# Patient Record
Sex: Male | Born: 1935 | Race: White | Hispanic: No | Marital: Married | Smoking: Former smoker
Health system: Southern US, Community
[De-identification: ages and names within clinical notes are randomized; demographics above are authoritative.]

## PROBLEM LIST (undated history)

## (undated) DIAGNOSIS — D72829 Elevated white blood cell count, unspecified: Principal | ICD-10-CM

## (undated) DIAGNOSIS — N182 Chronic kidney disease, stage 2 (mild): Secondary | ICD-10-CM

## (undated) DIAGNOSIS — J449 Chronic obstructive pulmonary disease, unspecified: Secondary | ICD-10-CM

## (undated) DIAGNOSIS — I480 Paroxysmal atrial fibrillation: Secondary | ICD-10-CM

## (undated) DIAGNOSIS — C3411 Malignant neoplasm of upper lobe, right bronchus or lung: Secondary | ICD-10-CM

## (undated) DIAGNOSIS — K219 Gastro-esophageal reflux disease without esophagitis: Secondary | ICD-10-CM

## (undated) DIAGNOSIS — C801 Malignant (primary) neoplasm, unspecified: Secondary | ICD-10-CM

## (undated) DIAGNOSIS — E785 Hyperlipidemia, unspecified: Secondary | ICD-10-CM

## (undated) DIAGNOSIS — I5022 Chronic systolic (congestive) heart failure: Secondary | ICD-10-CM

## (undated) DIAGNOSIS — I2510 Atherosclerotic heart disease of native coronary artery without angina pectoris: Secondary | ICD-10-CM

## (undated) DIAGNOSIS — E079 Disorder of thyroid, unspecified: Secondary | ICD-10-CM

## (undated) DIAGNOSIS — I10 Essential (primary) hypertension: Secondary | ICD-10-CM

## (undated) DIAGNOSIS — I2699 Other pulmonary embolism without acute cor pulmonale: Secondary | ICD-10-CM

## (undated) HISTORY — DX: Essential (primary) hypertension: I10

## (undated) HISTORY — DX: Paroxysmal atrial fibrillation: I48.0

## (undated) HISTORY — DX: Gastro-esophageal reflux disease without esophagitis: K21.9

## (undated) HISTORY — DX: Atherosclerotic heart disease of native coronary artery without angina pectoris: I25.10

## (undated) HISTORY — DX: Elevated white blood cell count, unspecified: D72.829

## (undated) HISTORY — PX: CAROTID STENT: SHX1301

## (undated) HISTORY — DX: Other pulmonary embolism without acute cor pulmonale: I26.99

## (undated) HISTORY — DX: Malignant neoplasm of upper lobe, right bronchus or lung: C34.11

## (undated) HISTORY — DX: Hyperlipidemia, unspecified: E78.5

## (undated) HISTORY — DX: Malignant (primary) neoplasm, unspecified: C80.1

## (undated) HISTORY — PX: HERNIA REPAIR: SHX51

## (undated) HISTORY — PX: CHOLECYSTECTOMY: SHX55

## (undated) HISTORY — DX: Chronic obstructive pulmonary disease, unspecified: J44.9

## (undated) HISTORY — DX: Disorder of thyroid, unspecified: E07.9

---

## 1997-08-04 ENCOUNTER — Inpatient Hospital Stay (HOSPITAL_COMMUNITY): Admission: EM | Admit: 1997-08-04 | Discharge: 1997-08-06 | Payer: Self-pay | Admitting: Emergency Medicine

## 1998-11-09 ENCOUNTER — Ambulatory Visit (HOSPITAL_BASED_OUTPATIENT_CLINIC_OR_DEPARTMENT_OTHER): Admission: RE | Admit: 1998-11-09 | Discharge: 1998-11-09 | Payer: Self-pay | Admitting: Orthopaedic Surgery

## 1999-08-12 ENCOUNTER — Ambulatory Visit (HOSPITAL_COMMUNITY): Admission: RE | Admit: 1999-08-12 | Discharge: 1999-08-12 | Payer: Self-pay | Admitting: *Deleted

## 1999-08-12 ENCOUNTER — Encounter (INDEPENDENT_AMBULATORY_CARE_PROVIDER_SITE_OTHER): Payer: Self-pay | Admitting: Specialist

## 2001-07-19 ENCOUNTER — Ambulatory Visit (HOSPITAL_COMMUNITY): Admission: RE | Admit: 2001-07-19 | Discharge: 2001-07-19 | Payer: Self-pay | Admitting: Cardiology

## 2001-08-06 ENCOUNTER — Ambulatory Visit (HOSPITAL_COMMUNITY): Admission: RE | Admit: 2001-08-06 | Discharge: 2001-08-07 | Payer: Self-pay | Admitting: Cardiology

## 2002-03-07 ENCOUNTER — Ambulatory Visit (HOSPITAL_COMMUNITY): Admission: RE | Admit: 2002-03-07 | Discharge: 2002-03-07 | Payer: Self-pay | Admitting: Cardiology

## 2002-03-07 ENCOUNTER — Encounter: Payer: Self-pay | Admitting: Cardiology

## 2002-04-17 ENCOUNTER — Ambulatory Visit: Admission: RE | Admit: 2002-04-17 | Discharge: 2002-04-17 | Payer: Self-pay | Admitting: Pulmonary Disease

## 2002-05-31 ENCOUNTER — Encounter: Payer: Self-pay | Admitting: Gastroenterology

## 2002-05-31 ENCOUNTER — Inpatient Hospital Stay (HOSPITAL_COMMUNITY): Admission: EM | Admit: 2002-05-31 | Discharge: 2002-06-03 | Payer: Self-pay

## 2002-06-01 ENCOUNTER — Encounter: Payer: Self-pay | Admitting: Gastroenterology

## 2002-06-02 ENCOUNTER — Encounter (INDEPENDENT_AMBULATORY_CARE_PROVIDER_SITE_OTHER): Payer: Self-pay | Admitting: Specialist

## 2002-08-07 ENCOUNTER — Ambulatory Visit (HOSPITAL_COMMUNITY): Admission: RE | Admit: 2002-08-07 | Discharge: 2002-08-07 | Payer: Self-pay | Admitting: Cardiology

## 2005-07-27 ENCOUNTER — Encounter: Admission: RE | Admit: 2005-07-27 | Discharge: 2005-07-27 | Payer: Self-pay | Admitting: Internal Medicine

## 2007-03-21 DIAGNOSIS — C801 Malignant (primary) neoplasm, unspecified: Secondary | ICD-10-CM

## 2007-03-21 HISTORY — DX: Malignant (primary) neoplasm, unspecified: C80.1

## 2007-04-03 ENCOUNTER — Encounter: Admission: RE | Admit: 2007-04-03 | Discharge: 2007-04-03 | Payer: Self-pay | Admitting: Cardiology

## 2007-04-12 ENCOUNTER — Ambulatory Visit (HOSPITAL_COMMUNITY): Admission: RE | Admit: 2007-04-12 | Discharge: 2007-04-12 | Payer: Self-pay | Admitting: Internal Medicine

## 2007-05-01 ENCOUNTER — Ambulatory Visit: Payer: Self-pay | Admitting: Thoracic Surgery

## 2007-05-03 ENCOUNTER — Ambulatory Visit: Payer: Self-pay | Admitting: Internal Medicine

## 2007-05-06 ENCOUNTER — Encounter (INDEPENDENT_AMBULATORY_CARE_PROVIDER_SITE_OTHER): Payer: Self-pay | Admitting: Diagnostic Radiology

## 2007-05-06 ENCOUNTER — Ambulatory Visit (HOSPITAL_COMMUNITY): Admission: RE | Admit: 2007-05-06 | Discharge: 2007-05-06 | Payer: Self-pay | Admitting: Thoracic Surgery

## 2007-05-07 ENCOUNTER — Ambulatory Visit: Admission: RE | Admit: 2007-05-07 | Discharge: 2007-07-16 | Payer: Self-pay | Admitting: Radiation Oncology

## 2007-05-07 ENCOUNTER — Ambulatory Visit (HOSPITAL_COMMUNITY): Admission: RE | Admit: 2007-05-07 | Discharge: 2007-05-07 | Payer: Self-pay | Admitting: Thoracic Surgery

## 2007-05-08 LAB — CBC WITH DIFFERENTIAL/PLATELET
BASO%: 0.8 % (ref 0.0–2.0)
EOS%: 5.7 % (ref 0.0–7.0)
HCT: 45 % (ref 38.7–49.9)
MCH: 31.6 pg (ref 28.0–33.4)
MCHC: 35.5 g/dL (ref 32.0–35.9)
MONO#: 0.6 10*3/uL (ref 0.1–0.9)
NEUT%: 59.9 % (ref 40.0–75.0)
RBC: 5.03 10*6/uL (ref 4.20–5.71)
WBC: 9.4 10*3/uL (ref 4.0–10.0)
lymph#: 2.6 10*3/uL (ref 0.9–3.3)

## 2007-05-08 LAB — COMPREHENSIVE METABOLIC PANEL
ALT: 36 U/L (ref 0–53)
AST: 26 U/L (ref 0–37)
Calcium: 9.5 mg/dL (ref 8.4–10.5)
Chloride: 102 mEq/L (ref 96–112)
Creatinine, Ser: 1.55 mg/dL — ABNORMAL HIGH (ref 0.40–1.50)
Sodium: 139 mEq/L (ref 135–145)
Total Bilirubin: 1.1 mg/dL (ref 0.3–1.2)
Total Protein: 6.9 g/dL (ref 6.0–8.3)

## 2007-05-27 LAB — COMPREHENSIVE METABOLIC PANEL
ALT: 22 U/L (ref 0–53)
BUN: 32 mg/dL — ABNORMAL HIGH (ref 6–23)
CO2: 24 mEq/L (ref 19–32)
Calcium: 9.3 mg/dL (ref 8.4–10.5)
Chloride: 104 mEq/L (ref 96–112)
Creatinine, Ser: 1.57 mg/dL — ABNORMAL HIGH (ref 0.40–1.50)

## 2007-05-27 LAB — CBC WITH DIFFERENTIAL/PLATELET
BASO%: 1.3 % (ref 0.0–2.0)
HCT: 42.1 % (ref 38.7–49.9)
HGB: 14.9 g/dL (ref 13.0–17.1)
MCHC: 35.4 g/dL (ref 32.0–35.9)
MONO#: 0.9 10*3/uL (ref 0.1–0.9)
NEUT%: 68.3 % (ref 40.0–75.0)
RDW: 12.3 % (ref 11.2–14.6)
WBC: 9.4 10*3/uL (ref 4.0–10.0)
lymph#: 1.7 10*3/uL (ref 0.9–3.3)

## 2007-05-28 ENCOUNTER — Ambulatory Visit: Payer: Self-pay | Admitting: Thoracic Surgery

## 2007-06-03 LAB — COMPREHENSIVE METABOLIC PANEL
Albumin: 4.5 g/dL (ref 3.5–5.2)
BUN: 35 mg/dL — ABNORMAL HIGH (ref 6–23)
CO2: 23 mEq/L (ref 19–32)
Calcium: 9.7 mg/dL (ref 8.4–10.5)
Chloride: 102 mEq/L (ref 96–112)
Creatinine, Ser: 1.54 mg/dL — ABNORMAL HIGH (ref 0.40–1.50)
Glucose, Bld: 183 mg/dL — ABNORMAL HIGH (ref 70–99)
Potassium: 4.4 mEq/L (ref 3.5–5.3)

## 2007-06-03 LAB — CBC WITH DIFFERENTIAL/PLATELET
Basophils Absolute: 0.1 10*3/uL (ref 0.0–0.1)
Eosinophils Absolute: 0.2 10*3/uL (ref 0.0–0.5)
HCT: 44.9 % (ref 38.7–49.9)
HGB: 16 g/dL (ref 13.0–17.1)
MCH: 31.4 pg (ref 28.0–33.4)
MONO#: 0.6 10*3/uL (ref 0.1–0.9)
NEUT#: 6.5 10*3/uL (ref 1.5–6.5)
NEUT%: 76.1 % — ABNORMAL HIGH (ref 40.0–75.0)
lymph#: 1.2 10*3/uL (ref 0.9–3.3)

## 2007-06-07 ENCOUNTER — Encounter: Payer: Self-pay | Admitting: Thoracic Surgery

## 2007-06-07 ENCOUNTER — Ambulatory Visit (HOSPITAL_COMMUNITY): Admission: RE | Admit: 2007-06-07 | Discharge: 2007-06-07 | Payer: Self-pay | Admitting: Thoracic Surgery

## 2007-06-07 HISTORY — PX: OTHER SURGICAL HISTORY: SHX169

## 2007-06-10 ENCOUNTER — Ambulatory Visit: Payer: Self-pay | Admitting: Thoracic Surgery

## 2007-06-10 LAB — COMPREHENSIVE METABOLIC PANEL
ALT: 30 U/L (ref 0–53)
AST: 20 U/L (ref 0–37)
Albumin: 4.1 g/dL (ref 3.5–5.2)
BUN: 29 mg/dL — ABNORMAL HIGH (ref 6–23)
CO2: 24 mEq/L (ref 19–32)
Calcium: 9.1 mg/dL (ref 8.4–10.5)
Chloride: 104 mEq/L (ref 96–112)
Creatinine, Ser: 1.52 mg/dL — ABNORMAL HIGH (ref 0.40–1.50)
Potassium: 3.7 mEq/L (ref 3.5–5.3)

## 2007-06-10 LAB — CBC WITH DIFFERENTIAL/PLATELET
BASO%: 1.3 % (ref 0.0–2.0)
Basophils Absolute: 0.1 10*3/uL (ref 0.0–0.1)
EOS%: 1.8 % (ref 0.0–7.0)
HCT: 44.7 % (ref 38.7–49.9)
HGB: 15.7 g/dL (ref 13.0–17.1)
MONO#: 0.7 10*3/uL (ref 0.1–0.9)
NEUT%: 73.9 % (ref 40.0–75.0)
RDW: 12.1 % (ref 11.2–14.6)
WBC: 6.1 10*3/uL (ref 4.0–10.0)
lymph#: 0.8 10*3/uL — ABNORMAL LOW (ref 0.9–3.3)

## 2007-06-11 ENCOUNTER — Ambulatory Visit: Payer: Self-pay | Admitting: Thoracic Surgery

## 2007-06-11 ENCOUNTER — Encounter: Admission: RE | Admit: 2007-06-11 | Discharge: 2007-06-11 | Payer: Self-pay | Admitting: Thoracic Surgery

## 2007-06-17 LAB — COMPREHENSIVE METABOLIC PANEL
ALT: 31 U/L (ref 0–53)
AST: 30 U/L (ref 0–37)
Albumin: 4.6 g/dL (ref 3.5–5.2)
CO2: 25 mEq/L (ref 19–32)
Calcium: 9.3 mg/dL (ref 8.4–10.5)
Chloride: 102 mEq/L (ref 96–112)
Potassium: 3.8 mEq/L (ref 3.5–5.3)
Sodium: 141 mEq/L (ref 135–145)
Total Protein: 6.9 g/dL (ref 6.0–8.3)

## 2007-06-17 LAB — CBC WITH DIFFERENTIAL/PLATELET
BASO%: 1.4 % (ref 0.0–2.0)
EOS%: 1.4 % (ref 0.0–7.0)
HCT: 39.4 % (ref 38.7–49.9)
MCH: 32 pg (ref 28.0–33.4)
MCHC: 36.1 g/dL — ABNORMAL HIGH (ref 32.0–35.9)
MONO#: 1.2 10*3/uL — ABNORMAL HIGH (ref 0.1–0.9)
RDW: 13.7 % (ref 11.2–14.6)
WBC: 6.6 10*3/uL (ref 4.0–10.0)
lymph#: 0.6 10*3/uL — ABNORMAL LOW (ref 0.9–3.3)

## 2007-06-20 ENCOUNTER — Ambulatory Visit: Payer: Self-pay | Admitting: Internal Medicine

## 2007-06-24 LAB — CBC WITH DIFFERENTIAL/PLATELET
Eosinophils Absolute: 0.2 10*3/uL (ref 0.0–0.5)
MCV: 89.8 fL (ref 81.6–98.0)
MONO#: 0.6 10*3/uL (ref 0.1–0.9)
MONO%: 6.7 % (ref 0.0–13.0)
NEUT#: 6.9 10*3/uL — ABNORMAL HIGH (ref 1.5–6.5)
RBC: 4.35 10*6/uL (ref 4.20–5.71)
RDW: 13.3 % (ref 11.2–14.6)
WBC: 8.3 10*3/uL (ref 4.0–10.0)
lymph#: 0.6 10*3/uL — ABNORMAL LOW (ref 0.9–3.3)

## 2007-06-24 LAB — COMPREHENSIVE METABOLIC PANEL
AST: 22 U/L (ref 0–37)
Albumin: 4.1 g/dL (ref 3.5–5.2)
Alkaline Phosphatase: 90 U/L (ref 39–117)
Chloride: 105 mEq/L (ref 96–112)
Glucose, Bld: 219 mg/dL — ABNORMAL HIGH (ref 70–99)
Potassium: 4.2 mEq/L (ref 3.5–5.3)
Sodium: 136 mEq/L (ref 135–145)
Total Protein: 6.4 g/dL (ref 6.0–8.3)

## 2007-06-28 ENCOUNTER — Ambulatory Visit: Admission: RE | Admit: 2007-06-28 | Discharge: 2007-06-28 | Payer: Self-pay | Admitting: Radiation Oncology

## 2007-06-28 ENCOUNTER — Ambulatory Visit (HOSPITAL_COMMUNITY): Admission: RE | Admit: 2007-06-28 | Discharge: 2007-06-28 | Payer: Self-pay | Admitting: Radiation Oncology

## 2007-06-28 LAB — COMPREHENSIVE METABOLIC PANEL
AST: 35 U/L (ref 0–37)
Albumin: 3.4 g/dL — ABNORMAL LOW (ref 3.5–5.2)
Alkaline Phosphatase: 66 U/L (ref 39–117)
Potassium: 4.2 mEq/L (ref 3.5–5.3)
Sodium: 135 mEq/L (ref 135–145)
Total Protein: 5.9 g/dL — ABNORMAL LOW (ref 6.0–8.3)

## 2007-06-28 LAB — CBC WITH DIFFERENTIAL/PLATELET
EOS%: 2 % (ref 0.0–7.0)
MCH: 32.1 pg (ref 28.0–33.4)
MCV: 89.6 fL (ref 81.6–98.0)
MONO%: 3.2 % (ref 0.0–13.0)
NEUT#: 3.6 10*3/uL (ref 1.5–6.5)
RBC: 4.02 10*6/uL — ABNORMAL LOW (ref 4.20–5.71)
RDW: 13.8 % (ref 11.2–14.6)

## 2007-07-09 ENCOUNTER — Ambulatory Visit: Payer: Self-pay | Admitting: Thoracic Surgery

## 2007-07-09 ENCOUNTER — Encounter: Admission: RE | Admit: 2007-07-09 | Discharge: 2007-07-09 | Payer: Self-pay | Admitting: Thoracic Surgery

## 2007-07-16 ENCOUNTER — Inpatient Hospital Stay (HOSPITAL_COMMUNITY): Admission: AD | Admit: 2007-07-16 | Discharge: 2007-07-21 | Payer: Self-pay | Admitting: Internal Medicine

## 2007-07-16 ENCOUNTER — Ambulatory Visit: Payer: Self-pay | Admitting: Internal Medicine

## 2007-07-17 ENCOUNTER — Encounter: Payer: Self-pay | Admitting: Internal Medicine

## 2007-07-18 ENCOUNTER — Ambulatory Visit: Payer: Self-pay | Admitting: Thoracic Surgery

## 2007-08-01 ENCOUNTER — Inpatient Hospital Stay (HOSPITAL_COMMUNITY): Admission: RE | Admit: 2007-08-01 | Discharge: 2007-08-10 | Payer: Self-pay | Admitting: Thoracic Surgery

## 2007-08-02 ENCOUNTER — Encounter: Payer: Self-pay | Admitting: Thoracic Surgery

## 2007-08-02 HISTORY — PX: OTHER SURGICAL HISTORY: SHX169

## 2007-08-06 ENCOUNTER — Encounter (INDEPENDENT_AMBULATORY_CARE_PROVIDER_SITE_OTHER): Payer: Self-pay | Admitting: Cardiology

## 2007-08-21 ENCOUNTER — Encounter: Admission: RE | Admit: 2007-08-21 | Discharge: 2007-08-21 | Payer: Self-pay | Admitting: Thoracic Surgery

## 2007-08-21 ENCOUNTER — Ambulatory Visit: Payer: Self-pay | Admitting: Thoracic Surgery

## 2007-09-11 ENCOUNTER — Encounter: Admission: RE | Admit: 2007-09-11 | Discharge: 2007-09-11 | Payer: Self-pay | Admitting: Thoracic Surgery

## 2007-09-11 ENCOUNTER — Ambulatory Visit: Payer: Self-pay | Admitting: Thoracic Surgery

## 2007-10-23 ENCOUNTER — Encounter: Admission: RE | Admit: 2007-10-23 | Discharge: 2007-10-23 | Payer: Self-pay | Admitting: Thoracic Surgery

## 2007-10-23 ENCOUNTER — Ambulatory Visit: Payer: Self-pay | Admitting: Thoracic Surgery

## 2007-12-18 ENCOUNTER — Ambulatory Visit: Payer: Self-pay | Admitting: Thoracic Surgery

## 2007-12-18 ENCOUNTER — Encounter: Admission: RE | Admit: 2007-12-18 | Discharge: 2007-12-18 | Payer: Self-pay | Admitting: Thoracic Surgery

## 2008-01-20 ENCOUNTER — Ambulatory Visit: Payer: Self-pay | Admitting: Thoracic Surgery

## 2008-01-20 ENCOUNTER — Encounter: Admission: RE | Admit: 2008-01-20 | Discharge: 2008-01-20 | Payer: Self-pay | Admitting: Thoracic Surgery

## 2008-04-15 ENCOUNTER — Ambulatory Visit: Payer: Self-pay | Admitting: Pulmonary Disease

## 2008-04-15 DIAGNOSIS — R0602 Shortness of breath: Secondary | ICD-10-CM

## 2008-04-21 ENCOUNTER — Ambulatory Visit: Payer: Self-pay | Admitting: Internal Medicine

## 2008-04-23 ENCOUNTER — Ambulatory Visit: Payer: Self-pay | Admitting: Pulmonary Disease

## 2008-04-23 DIAGNOSIS — J209 Acute bronchitis, unspecified: Secondary | ICD-10-CM | POA: Insufficient documentation

## 2008-04-23 DIAGNOSIS — J440 Chronic obstructive pulmonary disease with acute lower respiratory infection: Secondary | ICD-10-CM

## 2008-07-29 ENCOUNTER — Ambulatory Visit: Payer: Self-pay | Admitting: Thoracic Surgery

## 2008-07-29 ENCOUNTER — Encounter: Admission: RE | Admit: 2008-07-29 | Discharge: 2008-07-29 | Payer: Self-pay | Admitting: Thoracic Surgery

## 2008-09-28 IMAGING — CR DG CHEST 2V
2 series · 2 of 2 positions shown · non-contrast
Comparison: Chest radiograph 08/09/2007 the

CLINICAL DATA: Right upper lobe mass

CHEST - 2 VIEW

[w chest pa]
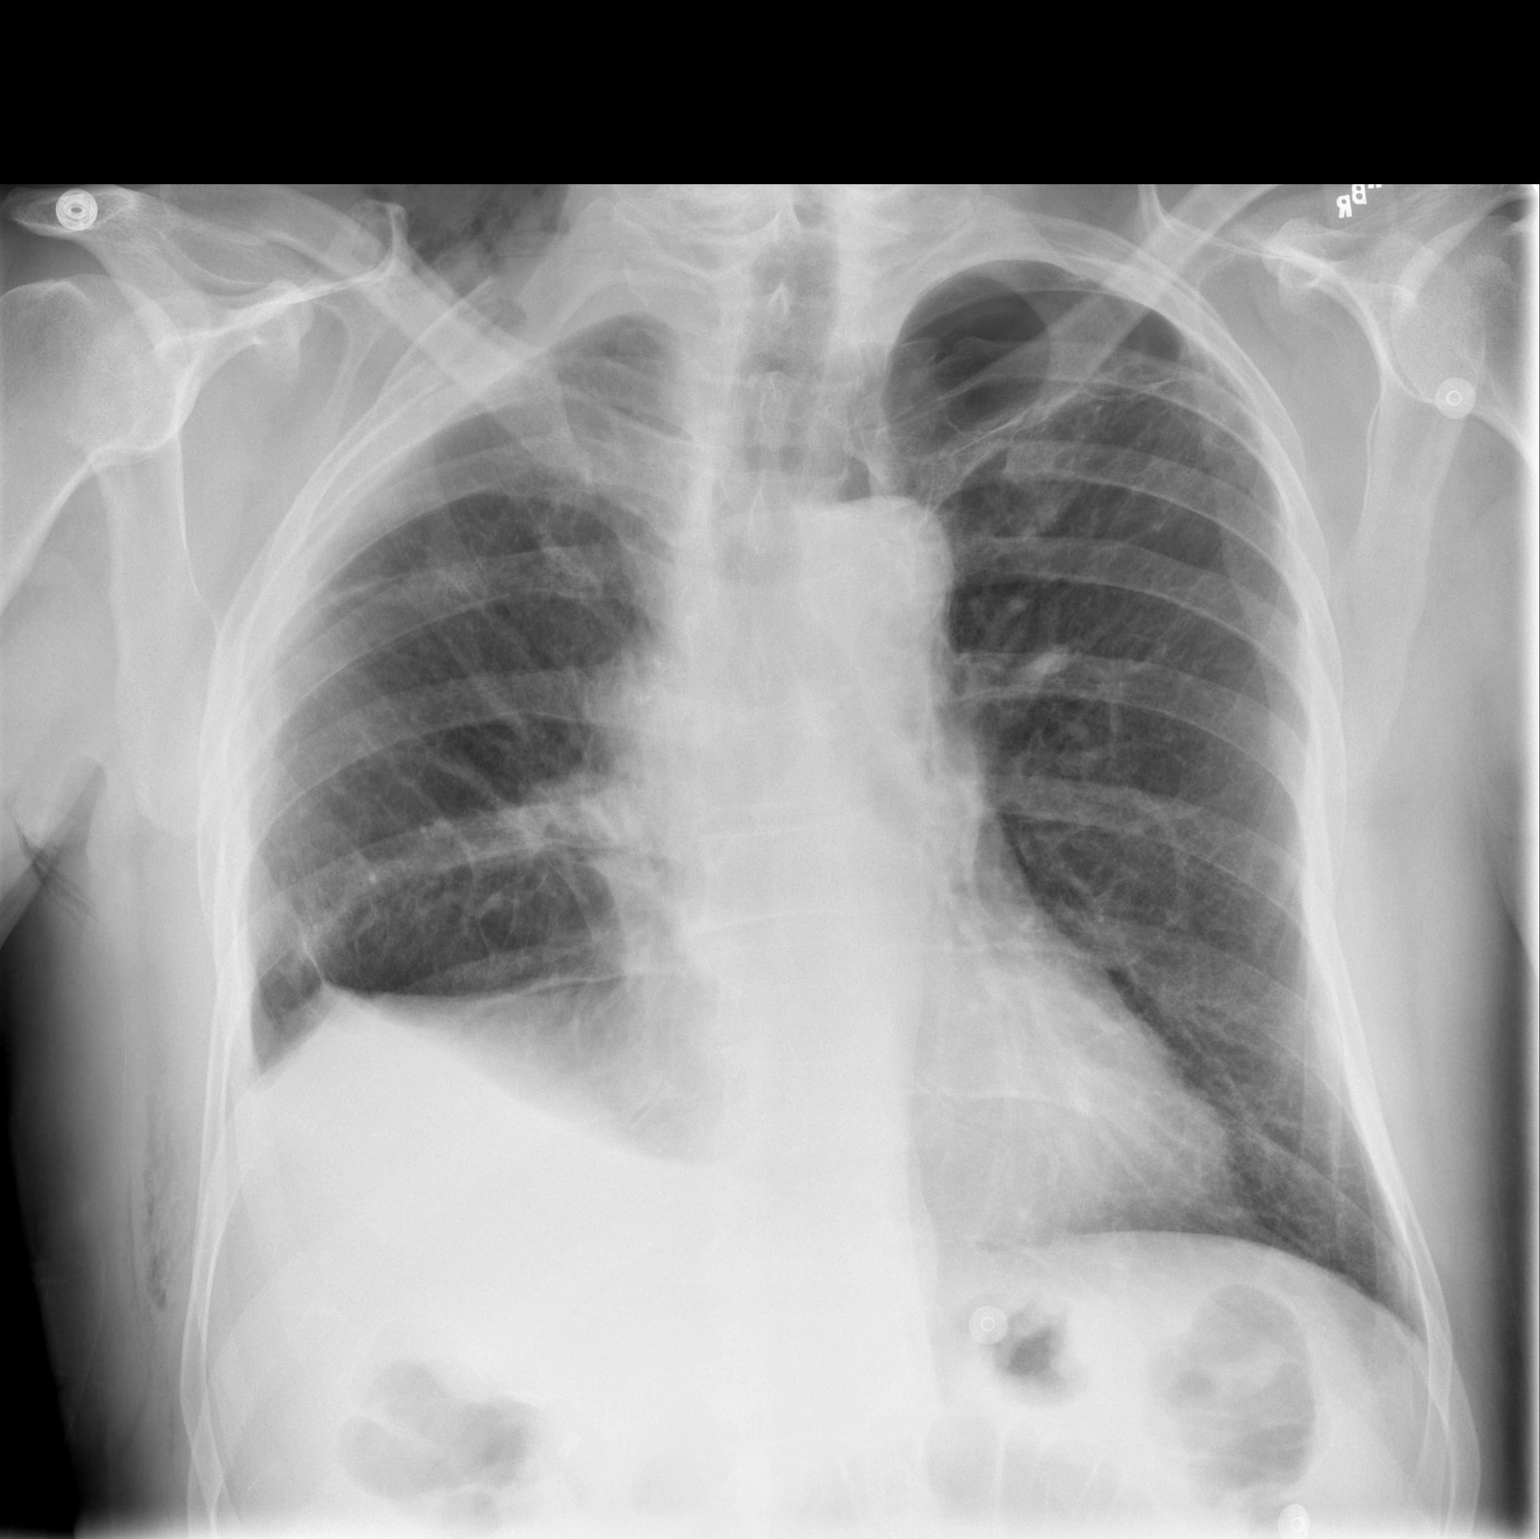

[w chest lat]
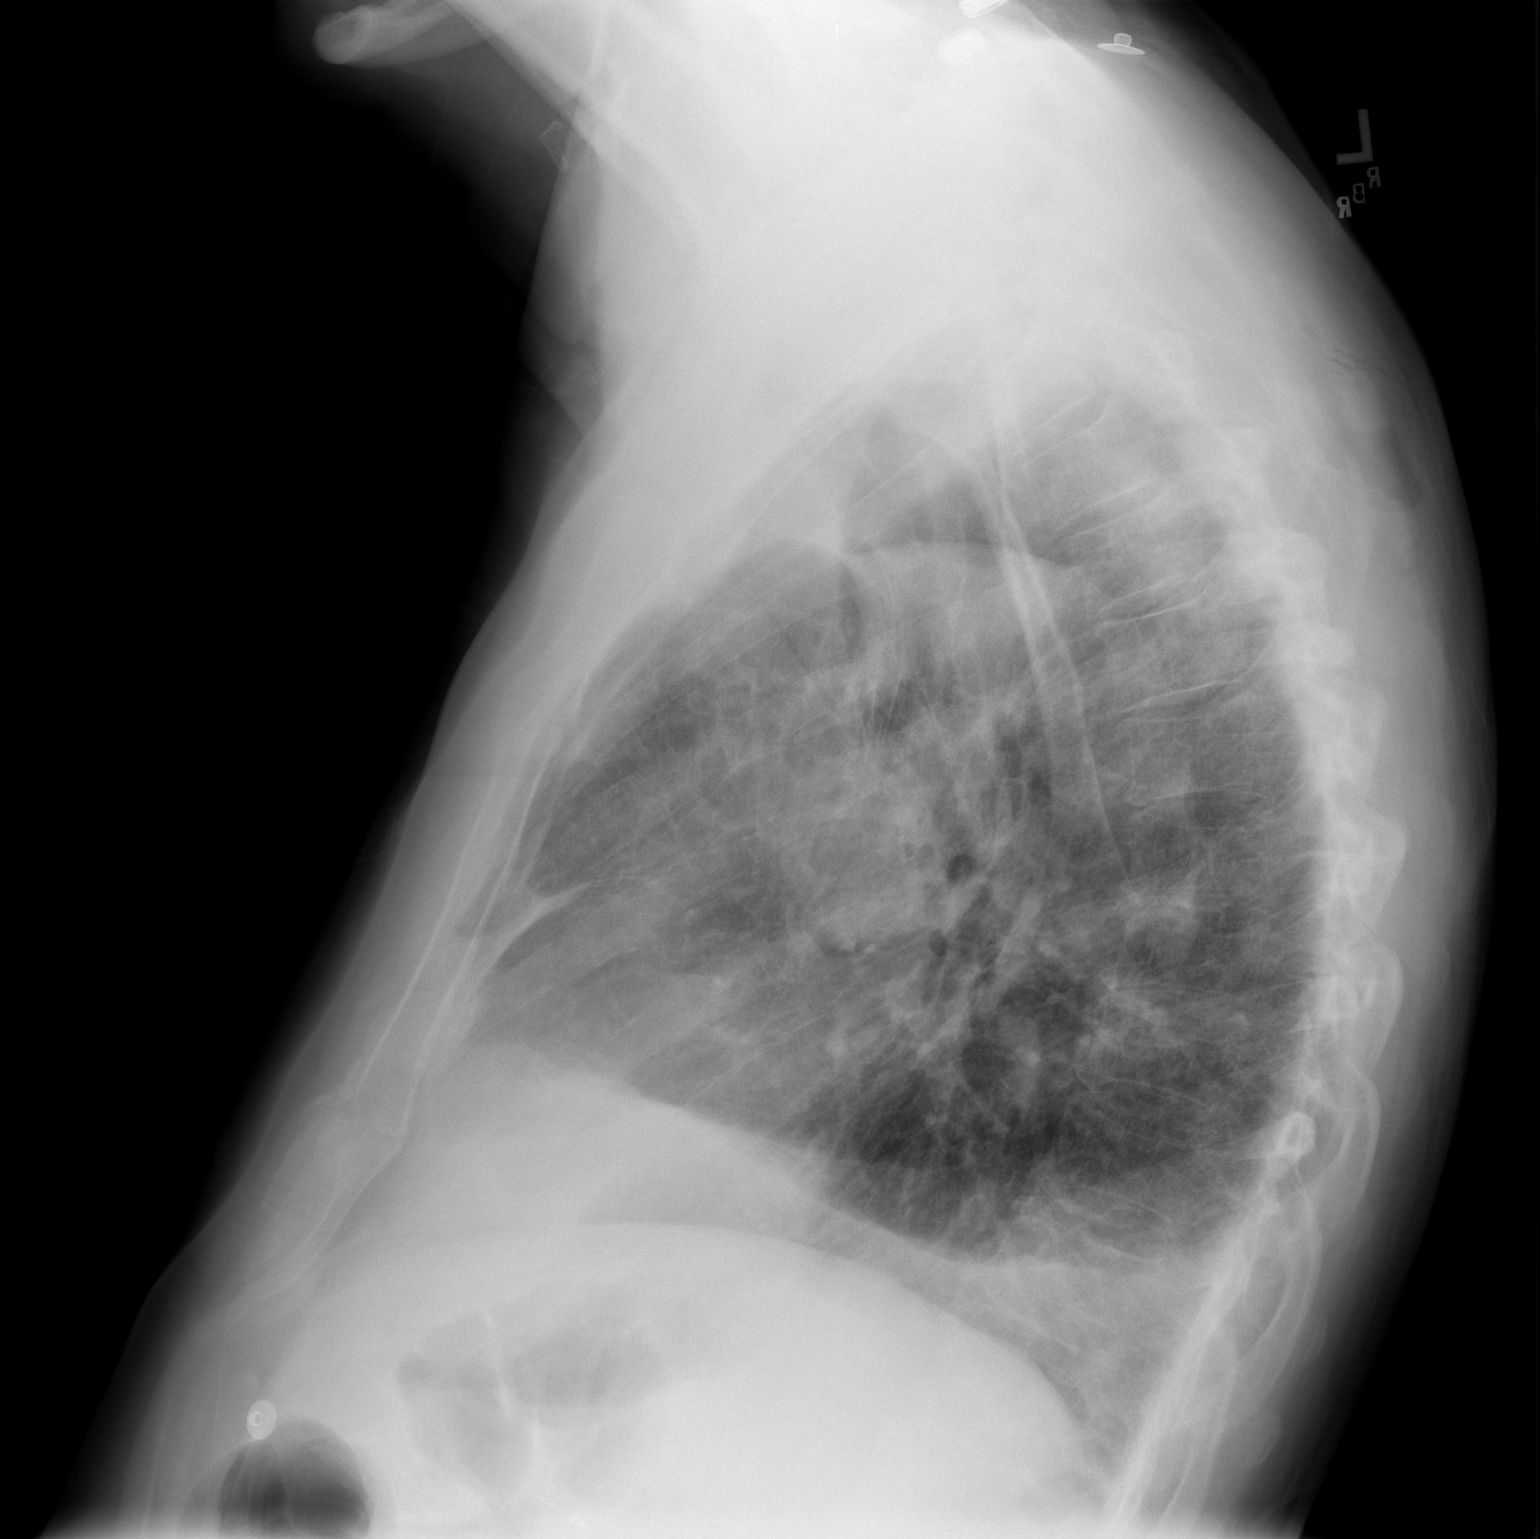

[2 of 2 positions shown; findings below may reference images not displayed]

FINDINGS: Stable mediastinum and heart silhouette.  There is volume
loss in the right hemithorax with tenting of the hemidiaphragm.
Interval removal of right IJ catheter.  There is lucency at the
left lung apex likely representing a bulla.
IMPRESSION: 1..  No  interval change.
2..  Postsurgical change in the right hemithorax.

## 2008-10-20 ENCOUNTER — Ambulatory Visit: Payer: Self-pay | Admitting: Pulmonary Disease

## 2009-02-03 ENCOUNTER — Encounter: Admission: RE | Admit: 2009-02-03 | Discharge: 2009-02-03 | Payer: Self-pay | Admitting: Thoracic Surgery

## 2009-02-03 ENCOUNTER — Ambulatory Visit: Payer: Self-pay | Admitting: Thoracic Surgery

## 2009-05-17 ENCOUNTER — Encounter: Admission: RE | Admit: 2009-05-17 | Discharge: 2009-05-17 | Payer: Self-pay | Admitting: Gastroenterology

## 2009-08-04 ENCOUNTER — Encounter: Admission: RE | Admit: 2009-08-04 | Discharge: 2009-08-04 | Payer: Self-pay | Admitting: Thoracic Surgery

## 2009-08-04 ENCOUNTER — Ambulatory Visit: Payer: Self-pay | Admitting: Thoracic Surgery

## 2009-11-01 ENCOUNTER — Ambulatory Visit: Payer: Self-pay | Admitting: Pulmonary Disease

## 2010-01-25 ENCOUNTER — Encounter: Admission: RE | Admit: 2010-01-25 | Discharge: 2010-01-25 | Payer: Self-pay | Admitting: Thoracic Surgery

## 2010-01-25 ENCOUNTER — Ambulatory Visit: Payer: Self-pay | Admitting: Thoracic Surgery

## 2010-04-10 ENCOUNTER — Encounter: Payer: Self-pay | Admitting: Thoracic Surgery

## 2010-04-19 NOTE — Assessment & Plan Note (Signed)
Summary: rov for emphysema   Copy to:  Amil Amen Primary Provider/Referring Provider:  Nehemiah Settle  CC:  Pt is here for a 1 yr f/u appt on his emphysema.  Pt states breathing is unchanged since last year- still sob with exertion.  pt does c/o coughing up small amounts of white sputum. Marland Kitchen  History of Present Illness: the pt comes in today for f/u of his known emphysema.  He has been tried on various bronchodilator regimens, but has not seen enough benefit to justify the expense.  He is having issues with affording his meds.  Currently, he is on a rescue inhaler only.  He feels his exertional tolerance is unchanged from a year ago, and continues to have minimal am cough with scant white mucus.  He denies any recent acute exacerbation.  Current Medications (verified): 1)  Glipizide Xl 10 Mg Xr24h-Tab (Glipizide) .... Take 1 Tablet By Mouth Once A Day 2)  Cvs Omeprazole 20 Mg Tbec (Omeprazole) .... Once Daily 3)  Levoxyl 112 Mcg Tabs (Levothyroxine Sodium) .Marland Kitchen.. 1 Tab in Am On An Empty Stomach 4)  Simvastatin 20 Mg Tabs (Simvastatin) .... Once Daily 5)  Fenofibrate Micronized 67 Mg Caps (Fenofibrate Micronized) .Marland Kitchen.. 1 Cap With 1 Meal Per Day 6)  Dilt-Cd 240 Mg Xr24h-Cap (Diltiazem Hcl Coated Beads) .Marland Kitchen.. 1 Cap On Empty Stomach Daily 7)  Coumadin 1 Mg Tabs (Warfarin Sodium) .... As Directed 8)  Adult Aspirin Ec Low Strength 81 Mg Tbec (Aspirin) .... Once Daily 9)  Fish Oil Tabs .... Take 1 Tablet By Mouth Once A Day  Allergies (verified): No Known Drug Allergies  Review of Systems       The patient complains of shortness of breath with activity, productive cough, and sneezing.  The patient denies shortness of breath at rest, non-productive cough, coughing up blood, chest pain, irregular heartbeats, acid heartburn, indigestion, loss of appetite, weight change, abdominal pain, difficulty swallowing, sore throat, tooth/dental problems, headaches, nasal congestion/difficulty breathing through nose, itching,  ear ache, anxiety, depression, hand/feet swelling, joint stiffness or pain, rash, change in color of mucus, and fever.    Vital Signs:  Patient profile:   77 year old male Height:      74 inches Weight:      240.25 pounds BMI:     30.96 O2 Sat:      94 % on Room air Temp:     97.8 degrees F oral Pulse rate:   63 / minute BP sitting:   110 / 60  (left arm) Cuff size:   large  Vitals Entered By: Arman Filter LPN (November 01, 2009 12:06 PM)  O2 Flow:  Room air CC: Pt is here for a 1 yr f/u appt on his emphysema.  Pt states breathing is unchanged since last year- still sob with exertion.  pt does c/o coughing up small amounts of white sputum.  Comments Medications reviewed with patient Arman Filter LPN  November 01, 2009 12:06 PM    Physical Exam  General:  overweight male in nad Lungs:  decreaed bs, but no wheezing Heart:  rrr Extremities:  no edema or cyanosis  Neurologic:  alert and oriented, moves all 4.   Impression & Recommendations:  Problem # 1:  EMPHYSEMA (ICD-492.8) the pt is currently stable from a pulmonary standpoint.  However, he continues to have baseline doe that may improve on meds.  It is unclear if the pt really didn't see an improvement with various bronchodilator trials, or if he didn't  feel the cost justified continuing?  I would like to try him on dulera, and if he sees a difference, we can see if he qualifies for pt assistance program  Medications Added to Medication List This Visit: 1)  Fish Oil Tabs  .... Take 1 tablet by mouth once a day  Other Orders: Est. Patient Level III (01027)  Patient Instructions: 1)  will try dulera 100/5  2 inhalations in am and pm...rinse mouth well.  If helps, let us know and we can try and get you on the patient assistance program thru the drug company. 2)  work on exercise program and weight loss 3)  followup with me in one year.

## 2010-08-02 NOTE — H&P (Signed)
NAME:  Justin Garner, Justin Garner NO.:  [id number]   MEDICAL RECORD NO.:  [id number]          PATIENT TYPE:  INP   LOCATION:  NA                           FACILITY:  [facility]   PHYSICIAN:  Ines Bloomer, M.D. DATE OF BIRTH:  07-21-1932   DATE OF ADMISSION:  DATE OF DISCHARGE:                              HISTORY & PHYSICAL   This is a 77 year old Caucasian male was found to have a right anterior  chest wall pain.  A CT scan done in May 2007 was negative, but he  continued to have pain.  A repeat CT scan showed that he had a right  upper lobe Pancoast tumor involving the first to the sixth ribs  posteriorly.  A PET scan was negative for source spread.  A needle  biopsy revealed adenocarcinoma.  Mediastinoscopy was negative.  Pulmonary function tests showed an FVC of 4.81 with FEV-1 of 2.75 or 75%  of predicted.  He quit smoking 60 years ago, but smoked for many years,  and has a bilateral bullous disease in both apices.  He has no history  of headaches, seizures, blackouts, hemoptysis, fever, chills, or  excessive sputum.  He has pain down the inferior portion of the right  arm in the ulnar distribution.   MEDICATIONS:  1. Zolpidem 10 mg a day.  2. Foradil Aerolizer.  3. Flomax a day.  4. Glipizide 10 daily.  5. Simvastatin 20 mg a day.  6. Omeprazole 20 mg daily.  7. __________ 20 mg a day.  8. Fenofibrate 67 mg daily.  9. Aspirin 81 mg a day.  10.Levoxyl 125 mcg daily.  11.Diltiazem 150 mg daily.   PAST MEDICAL HISTORY:  He has a history of:  1. Atrial fibrillation.  2. Hypertension.  3. Diabetes type 2.  4. Hypercholesterolemia.  5. Hypothyroidism.  6. Coronary artery disease.  7. A small pulmonary embolus.   FAMILY HISTORY:  Noncontributory.   SOCIAL HISTORY:  He is married, has 3 children.  Quit smoking in 2003.  Does not drink alcohol on a regular basis.   REVIEW OF SYSTEMS:  He is 238 pounds and height 6 feet 2 inches.  CARDIAC:  He has atrial  fibrillation and shortness of breath with  exertion.  PULMONARY:  No hemoptysis, bronchitis, or asthma.  GI:  He  has reflux.  GU:  No kidney disease, dysuria, or frequent urination.  VASCULAR:  No claudication, DVT, or TIAs.  NEUROLOGIC:  No dizziness,  headaches, blackouts, or seizures.  MUSCULOSKELETAL:  He has an  occasional rash.  PSYCHIATRIC:  No problems with depression or  nervousness.  EYE/ENT:  No changes in eyesight or hearing.  HEMATOLOGICAL:  No problems with bleeding or clotting disorders, beside  of anemia secondary to his chemotherapy.   PHYSICAL EXAMINATION:  GENERAL:  He is well-developed Caucasian male in  no acute distress.  Head, atraumatic.  Eyes, pupils equal and reactive to light and  accommodation.  Extraocular movements are normal.  Ears, tympanic  membranes intact.  Nose, there is no septal deviation.  Throat without  lesion.  NECK:  Supple without  thyromegaly.  CHEST:  Clear to auscultation and percussion.  HEART:  Regular rate and rhythm.  No murmurs.  ABDOMEN:  Soft.  There is no hepatosplenomegaly.  EXTREMITIES:  Pulses are 2+.  There is no clubbing or edema.  NEUROLOGICAL:  He is oriented x3.  Sensory and motor intact.  Cranial  nerves intact.   IMPRESSION:  1. Superior sulcus tumor, right upper lobe, status post radiation and      chemotherapy.  2. History of tobacco abuse.  3. Recent pulmonary embolus.  4. Diabetes mellitus type 2.  5. Hypercholesterolemia.  6. Hypothyroidism.  7. Atrial fibrillation.  8. Coronary artery disease.   PLAN:  Right VATS, right thoracotomy, and right upper lobectomy with  chest wall resection.      Ines Bloomer, M.D.  Electronically Signed     DPB/MEDQ  D:  07/30/2007  T:  07/31/2007  Job:  [id number]

## 2010-08-02 NOTE — Op Note (Signed)
NAME:  Justin Garner, Justin Garner             ACCOUNT NO.:  [id number]   MEDICAL RECORD NO.:  [id number]           PATIENT TYPE:   LOCATION:                                 FACILITY:   PHYSICIAN:  Ines Bloomer, M.D. DATE OF BIRTH:  Mar 06, 1933   DATE OF PROCEDURE:  DATE OF DISCHARGE:                               OPERATIVE REPORT   PREOPERATIVE DIAGNOSIS:  Right upper lobe nonsmall cell lung cancer.   POSTOPERATIVE DIAGNOSIS:  Right upper lobe nonsmall cell lung cancer.   OPERATION PERFORMED:  Fiberoptic bronchoscopy and mediastinoscopy.   SURGEON:  Ines Bloomer, M.D.   ANESTHESIA:  General.   After general anesthesia, the video bronchoscope was passed through the  endotracheal tube.  The carina was midline.  The right upper lobe, right  middle lobe and right lower lobe orifices were normal.  The left upper  lobe and left lower lobe orifices were normal.  The video bronchoscope  was removed.  The anterior neck was prepped and draped in the usual  sterile manner.  Transverse incision was made and carried down with  electrocautery through subcutaneous tissue and fascia.  The pretracheal  fascia was entered and biopsies of a 4R, 4L, 7 and 2R nodes were done.  The strap muscles were closed with 2-0 Vicryl, subcutaneous tissue with  3-0 Vicryl, and Dermabond for the skin.      Ines Bloomer, M.D.  Electronically Signed     DPB/MEDQ  D:  06/07/2007  T:  06/07/2007  Job:  [id number]

## 2010-08-02 NOTE — Letter (Signed)
May 28, 2007   Lajuana Matte, MD  [phone] [address]  [place], [place] [address]   Re:  PEARSON, PICOU             DOB:  May 07, 1932   Dear Arbutus Ped:   I saw Mr. Beeks back in the office today and he has started his  radiation and chemotherapy.  I have scheduled him for a mediastinoscopy  for completeness to be sure there is no spread to the lymph nodes  despite a negative PET scan.  I plan to do this on the 20th of March at  [facility].  His blood pressure was 131/77, pulse 85, respirations  18.  Saturations were 92%.   I appreciate the opportunity of seeing Mr. Nicolson.   Ines Bloomer, M.D.  Electronically Signed   DPB/MEDQ  D:  05/28/2007  T:  05/28/2007  Job:  [id number]   cc:   Artist Pais Kathrynn Running, M.D.

## 2010-08-02 NOTE — Op Note (Signed)
NAMEANIRUDDH, CIAVARELLA             ACCOUNT NO.:  [id number]   MEDICAL RECORD NO.:  [id number]          PATIENT TYPE:  INP   LOCATION:  2301                         FACILITY:  [facility]   PHYSICIAN:  Ines Bloomer, M.D. DATE OF BIRTH:  [birth year]   DATE OF PROCEDURE:  DATE OF DISCHARGE:                               OPERATIVE REPORT   PREOPERATIVE DIAGNOSIS:  Right upper lobe superior sulcus tumor.   POSTOPERATIVE DIAGNOSIS:  Right upper lobe superior sulcus tumor.   OPERATION PERFORMED:  1. Right video-assisted thoracoscopic surgery.  2. Right thoracotomy.  3. Right upper lobectomy with chest wall resection of second and third      ribs.   SURGEON:  Ines Bloomer, MD   ANESTHESIA:  General.   PROCEDURE:  After the percutaneous insertion of all monitoring lines,  the patient underwent general anesthesia.  He was prepped and draped in  the usual sterile manner.  The patient was turned to the right lateral  thoracotomy.  A dual-lumen tube was inserted.  The right lung was  deflated.  Two trocar sites were made in the anterior and posterior  axillary line at the seventh intercostal space; a 0-degree scope was  inserted.  There was a lot of adhesions in the right upper lobe.  A  posterolateral thoracotomy was made over the fifth intercostal space,  diving the latissimus, reflecting the serratus anteriorly, dividing into  trapezius and rhomboids posteriorly.  The fifth intercostal space was  entered using the Bovie extender.  The adhesions anteriorly were taken  down with electrocautery under vision with a scope.  We then put in a 2-  PA and elevated the scapula and took down the rest of the rhomboids and  trapezius muscles.  The tumor was involving the second and third ribs,  but there was margins superiorly and inferiorly, so we entered the third  intercostal space about the fourth rib and resected this and opened the  intercostal space up to about the posterior to mid axillary  line.  We  then went superiorly dissecting out the second and third ribs and  dividing the second and third ribs with rib shears and then the first  rib with Gigli saw and rib shears.  We then elevated the paraspinous  muscles off and started dissecting down the second and third ribs until  we came to the angle of the ribs and reflected the third rib off the  transverse process and reflected the ribs superiorly.  In a like manner,  we dissected the second rib off transverse process.  We divided the  pleura with electrocautery.  There were several areas of fairly severe  venous bleeding that had to be oversewn with 2-0 silk or controlled with  electrocautery.  We took a superior margin which was negative, and then,  we gradually freed up the second and third ribs and resected them and  then resected the pleura, dividing the T2 nerve root and the T3 nerve  root.  After the rib had been done, we packed the areas of venous  bleeding with Surgicel after controlling with electrocautery.  We then  took down the rest of adhesions to the lower lobe, and the inferior  pulmonary ligament was taken down with electrocautery.  We then divided  together 9R node.  Dissecting up, we took out some 10R nodes.  He had  had a previously mediastinoscopy which was negative.  We dissected  around the bronchus to the upper lobe and then dissected out the 3  branches of the apical posterior branch and divided 1 open with  horizontal mattress 4-0 Prolene proximally and #1 silk distally.  The  other one was divided with the Auto Suture 30 stapler.  Several 10R  nodes were dissected free.  Then, the superior pulmonary vein was  divided with the Auto Suture 2.0 stapler.  Finally, we were able to get  around the right upper lobe bronchus, and we stapled it with a TA-30 and  divided it and then finally the fissures.  Superior fissure and the  minor fissure were divided with a TA-60 stapler with multiple  applications.   The right upper lobe and chest wall were en bloc; and on  frozen section, the margins were negative.  CoSeal was applied to the  staple line.  No air leak was seen.  Two chest tubes were brought out  through a separate stab wound.  CoSeal was applied to the area where the  venous bleeding was.  A Marcaine block was done in the usual fashion.  A  single On-Q was inserted in the usual fashion.  Chest was closed with #2  Vicryl pericostals going to the sixth rib passing around the fifth rib  and #1 Vicryl in the muscle layer and 2-0 Vicryl in subcutaneous tissue  and Ethicon for the skin.  The patient was returned to recovery room in  a stable condition.      Ines Bloomer, M.D.  Electronically Signed     DPB/MEDQ  D:  08/02/2007  T:  08/03/2007  Job:  [id number]   cc:   Lajuana Matte, MD  Artist Pais. Kathrynn Running, M.D.

## 2010-08-02 NOTE — Discharge Summary (Signed)
Justin Garner, Justin Garner             ACCOUNT NO.:  [id number]   MEDICAL RECORD NO.:  [id number]          PATIENT TYPE:  INP   LOCATION:  1313                         FACILITY:  [facility]   PHYSICIAN:  Lajuana Matte, MD  DATE OF BIRTH:  01/30/1933   DATE OF ADMISSION:  07/16/2007  DATE OF DISCHARGE:  07/21/2007                               DISCHARGE SUMMARY   ADMITTING DIAGNOSES:  1. Pulmonary embolism.  2. Dyspnea.  3. Stage IIB non-small cell lung cancer.   DISCHARGE DIAGNOSES:  1. Stage IIB non-small cell lung cancer.  2. Pulmonary embolism.  3. Chronic atrial fibrillation.  4. Diabetes mellitus, type 2.  5. Coronary artery disease.  6. Hypertension.  7. Hyperlipidemia.  8. Hypothyroidism.  9. Chronic obstructive pulmonary disease.   PROCEDURES:  IVC filter placement.   CONSULTATIONS:  Interventional radiology.   HOSPITAL COURSE:  This is a 77 year old white male with right pancoast  tumor stage IIB non-small cell lung cancer, status post chemotherapy and  radiation.  He had a CT of the chest done earlier on the day of  admission, July 16, 2007.  The CT revealed a partial decrease in size  of his right apical mass overall a repeat acute pulmonary embolism in  the right lower lobe pulmonary artery.  The patient reported a slight increase in shortness of breath over the  last 2-3 days and increased cough over the last 2 weeks.  The cough has  been productive of clear secretion and also a low-grade fever with a T-  max of 100.6.  He had no associated chills, no blurry vision or double  vision, no hemoptysis, palpitations, or syncopal episodes.  He has had  some nausea without vomiting.  No abdominal pain, diarrhea, or  constipation.   ADMISSION PHYSICAL EXAMINATION:  VITAL SIGNS:  Blood pressure 98/62,  pulse 90, respiratory rate 20, temperature 98.6.  GENERAL:  A 77 year old male, awake, alert, and oriented.  NECK:  Without limit lymphadenopathy.  HEART:  Normal S1 and  S2.  No murmurs noted.  CHEST:  Slight decrease in breath sounds at the right base otherwise  clear to auscultation.  ABDOMEN:  Unremarkable.  EXTREMITIES:  Without edema.   His INR on the day of admission was 1.4.   The patient was admitted for IVC filter placement.  He was placed on IV  heparin with pharmacy to dose, IV fluids, and was continued on his home  medications with the exception of his aspirin and also his Coumadin was  put on hold.  The patient did well, underwent IVC filter placement,  tolerated procedure well without complication.  He was continued on his  IV heparin.  Heparin was discontinued along with the Coumadin and he was  switched to Lovenox.  He was scheduled to have surgery done in a few  weeks.  He was to be continued with his IVC filter and subcutaneous  Lovenox prior to the surgery.  Blood sugars were a little high while in  the hospital.  He was treated with sliding scale.  Coronary artery  disease and hypertension remained stable.  He was instructed on how to  give himself Lovenox shots.  His respiratory status was stable and he  was stable to be discharged to home.   At the time of discharge, the patient was afebrile.  O2 sat greater than  92% on room air.  Glucose was ranging 128-339 but his steroids had been  stopped and will help with his blood sugar control.  Lovenox level high  therapeutic at 0.7.  Hemoglobin 12, platelets 268,000, white count 15.4  but this was likely secondary to recent IV steroids.  He is now on  single dose Lovenox prior to discharge, was able to administer Lovenox  on his own.  His wife was also instructed on how to give his Lovenox.   CONDITION ON DISCHARGE:  Stable condition.  The patient was afebrile.  Vital signs were stable.   DISCHARGE MEDICATIONS:  1. Glipizide ER 10 mg daily.  2. Simvastatin 20 mg daily.  3. Omeprazole 20 mg daily.  4. Fenofibrate 67 mg daily.  5. Aspirin 81 mg daily.  6. Synthroid 0.112 mg  daily.  7. He was instructed to stop his Coumadin, use his Lovenox at 150 mg      subcutaneous daily.   FOLLOWUP:  He was instructed to follow up with Dr. Arbutus Ped and to call  for a followup appointment and surgery was scheduled in a couple of  weeks.      Venita [place], [place]      Lajuana Matte, MD  Electronically Signed    JAV/MEDQ  D:  07/21/2007  T:  07/21/2007  Job:  [id number]   cc:   Lajuana Matte, MD  Fax: [phone]

## 2010-08-02 NOTE — Letter (Signed)
Jul 29, 2008   Lajuana Matte, MD  [phone]  [place], [place] [address]   Re:  SULEMAN, GUNNING             DOB:  [birth year]   The patient came today.  His blood pressure was 116/69, pulse 61,  respirations 18, and saturations were 96%.  He is doing well overall  with no evidence of recurrence of cancer.  I will plan to see him back  again in 6 months with a CT scan.   Ines Bloomer, M.D.  Electronically Signed   DPB/MEDQ  D:  07/29/2008  T:  07/30/2008  Job:  [id number]   cc:   Artist Pais Kathrynn Running, M.D.

## 2010-08-02 NOTE — Letter (Signed)
June 11, 2007   Lajuana Matte, MD  [phone] [address]  [place], [place] [address]   Re:  Justin Garner, Justin Garner             DOB:  11-Oct-1932   Dear Arbutus Ped:   I saw [name] back today after his mediastinoscopy and that was  completely negative.  There was no evidence of any cancer.  He is in his  third week of his radiation, and I will see him back again in four  weeks.  His blood pressure was 102/69, pulse 60, respirations 18,  saturations were 98%.   Ines Bloomer, M.D.  Electronically Signed   DPB/MEDQ  D:  06/11/2007  T:  06/11/2007  Job:  [id number]

## 2010-08-02 NOTE — Assessment & Plan Note (Signed)
OFFICE VISIT   Justin Garner, Justin Garner  DOB:  1932/08/31                                        September 11, 2007  CHART #:  [id number]   [name] came for followup today.  His incision is well healed.  He  is still having some chest wall pain.  Chest x-ray looks good, did show  postoperative changes after his Pancoast  removal.  Plan to see him back  again in 6 weeks with another chest x-ray.  Lungs are clear to  auscultation and percussion.   Ines Bloomer, M.D.  Electronically Signed   DPB/MEDQ  D:  09/11/2007  T:  09/11/2007  Job:  [id number]

## 2010-08-02 NOTE — Letter (Signed)
Aug 04, 2009   Justin Garner. Arbutus Ped, MD  [address]  [place], [place] [address]   Re:  AZAREL, BANNER             DOB:  14-Dec-1932   Dear Dr. Arbutus Ped;   I saw the patient back today and his CT scan showed no evidence of  recurrence of his cancer, that is a preliminary report.  I will check  his final report.  His blood pressure is 139/80, pulse 70, respirations  16, sats were 97%, but he is now 2 years since his surgery.  Plan to see  him back again in 6 months and repeated CT scan again.   Ines Bloomer, M.D.  Electronically Signed   DPB/MEDQ  D:  08/04/2009  T:  08/05/2009  Job:  [id number]

## 2010-08-02 NOTE — Assessment & Plan Note (Signed)
OFFICE VISIT   Justin Garner, Justin Garner  DOB:  07/15/1932                                        January 25, 2010  CHART #:  [id number]   The patient's blood pressure was 133/79, pulse 62, respirations 18,  saturations were 96%.  His CT scan showed no evidence of recurrence of  his cancer where he had a right upper lobectomy and chest wall resection  done over 2 years ago.  He is doing well overall and has had no other  medical problems.  I plan to see him back in a year with another CT  scan.   Ines Bloomer, M.D.  Electronically Signed   DPB/MEDQ  D:  01/25/2010  T:  01/26/2010  Job:  [id number]

## 2010-08-02 NOTE — Assessment & Plan Note (Signed)
OFFICE VISIT   OZELL, JUHASZ  DOB:  1932/04/20                                        February 03, 2009  CHART #:  [id number]   The patient came today and a CT scan showed evidence of recurrence now  over a year since his surgery.  His blood pressure is 155/90, pulse 75,  respirations 18, sats were 97%.  Plan to see him back again in 6 months  with another CT scan.   Ines Bloomer, M.D.  Electronically Signed   DPB/MEDQ  D:  02/03/2009  T:  02/04/2009  Job:  [id number]

## 2010-08-02 NOTE — Letter (Signed)
January 20, 2008   Lajuana Matte, MD  [phone] [address]  [place], [place] [address]   Re:  Justin Garner, Justin Garner             DOB:  September 18, 1932   Dear Arbutus Ped,   I saw the patient back today.  He is now 5 months since his right upper  lobectomy with chest wall resection for Pancoast tumor.  CT scan today  showed no evidence of recurrence of his tumor.  He is still having some  shortness of breath and we gave him a prescription for Combivent as well  as Atrovent.  Plan to see him back again in 6 months with another CT  scan, but his initial result looks good.  His blood pressure is 130/74,  pulse 75, respirations 20, and sats were 98%.  Lungs are clear to  auscultation and percussion.  I appreciate the opportunity of taking  care of the patient.   Sincerely,   Ines Bloomer, M.D.  Electronically Signed   DPB/MEDQ  D:  01/20/2008  T:  01/20/2008  Job:  [id number]
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
## 2010-08-02 NOTE — H&P (Signed)
Justin Garner, Justin Garner             ACCOUNT NO.:  [id number]   MEDICAL RECORD NO.:  [id number]          PATIENT TYPE:  INP   LOCATION:  1313                         FACILITY:  [facility]   PHYSICIAN:  Lajuana Matte, MD  DATE OF BIRTH:  1932/11/27   DATE OF ADMISSION:  07/16/2007  DATE OF DISCHARGE:                              HISTORY & PHYSICAL   REASON FOR ADMISSION:  1. Pulmonary embolus.  2. Dyspnea.  3. Stage IIB non-small-cell lung cancer.   HISTORY:  Mr. Justin Garner is a 77 year old white male with right Pancoast  tumor and stage IIB non-small-cell lung cancer, status post a course of  concurrent chemoradiation with chemotherapy in the form of weekly Taxol  at 45 mg/m2 and carboplatin for an AUC of 2.  The patient had a CT of  the chest with contrast earlier today to re-evaluate his disease prior  to proceeding to surgical intervention regarding his lung cancer.  CT  revealed that he had an interval response to therapy with partial  decrease in size of the right apical mass; however, also revealed an  acute pulmonary embolus to the right lower lobe pulmonary arteries.  Patient reported a sudden increase in shortness of breath over the past  2-3 days.  He has had increased cough for the past two weeks with a  cough productive of clear secretions associated with low grade fevers  with a T max of 100.6.   REVIEW OF SYSTEMS:  He had low grade temp with a T max of 100.6.  No  associated chills.  No headache, blurred vision, or double vision.  He  has had an increase in shortness of breath at rest and with exertion  over the past 2-3 days with cough for the past two weeks, productive of  clear secretions.  No hemoptysis, palpitations, or syncopal episodes.  He has had nausea without vomiting.  No abdominal pain, diarrhea,  constipation, melena, or hematochezia.  He denied dysuria, hematuria,  urgency, or increased frequency.   PAST MEDICAL HISTORY:  1. Atrial fibrillation, on  Coumadin therapy.  2. Hypertension.  3. Diabetes mellitus.  4. Hypercholesterolemia.  5. Hypothyroidism.  6. Coronary artery disease, status post two stent placements.  7. Emphysema, status post cholecystectomy and status post hernia      repair.   FAMILY HISTORY:  Mother died from old age at the age of 60.  Father died  from a heart attack.  Sister has liver disease.   SOCIAL HISTORY:  He is married with three children.  Also has two  stepchildren.  He is retired, previously working at [employer].  Tobacco  history:  He smoked one pack of cigarettes daily for greater than 50  years but quit in 2003.  He denied a history of alcohol or drug abuse.   CURRENT MEDICATIONS:  1. Aspirin 81 mg daily.  2. OxyIR 5 mg p.o. q.4-6h. as needed for pain.  3. Compazine 10 mg p.o. q.6h. as needed for nausea.  4. Prilosec 20 mg p.o. daily.  5. Glucotrol 10 mg p.o. daily.  6. Tricor 67 mg p.o. daily.  7. Zocor 20 mg p.o. daily.  8. His current Coumadin dose is 7.5 mg on Tuesday, Thursday, Saturday,      and 5 mg on all other days.  9. He also takes Dalmane 30 mg nightly.  10.Synthroid 112 mcg p.o. daily.   PHYSICAL EXAMINATION:  Blood pressure 98/62, pulse 90, respirations 20,  temperature 98.7.  Height 75 inches.  Weight 222.1 pounds.  GENERAL:  A very pleasant 77 year old white male who was awake and alert  in no acute distress.  HEENT:  Normocephalic and atraumatic.  Oropharynx is clear.  NECK:  Supple without lymphadenopathy.  CHEST:  Slight decreased breath sounds in the right base, otherwise  clear to auscultation without rales, wheezes, or rhonchi.  CARDIOVASCULAR:  A normal S1 and S2.  No appreciable murmurs, rubs or  gallops.  ABDOMEN:  Soft, nontender, nondistended without masses.  EXTREMITIES:  Without edema.   LABORATORY DATA:  An INR today was 1.4.   ASSESSMENT/PLAN:  This is a pleasant 77 year old white male with stage  IIB non-small-cell lung cancer, now status post a  course of concurrent  chemoradiation, presenting with an acute pulmonary embolus picked up on  his restaging CT of the chest with contrast.  We are in contact with his  cardiologist office, Dr. Amil Amen, regarding this new phenomena, and he  is in agreement with admitting the patient for IVC filter placement.  He  will be placed on  IV heparin with pharmacy to dose, supported with IV fluids, and will  continue on his home medications with the exception of his aspirin, and  we will hold the Coumadin for now as well as holding the Dalmane.  For  cough, the patient will be placed on Tussionex 5 ml p.o. q.12h.      Tiana Loft, PA.      Lajuana Matte, MD  Electronically Signed    AJ/MEDQ  D:  07/16/2007  T:  07/16/2007  Job:  [id number]   cc:   Ines Bloomer, M.D.  [address]  [place]  [place] [address]   Deirdre Peer. Polite, M.D.   Artist Pais Kathrynn Running, M.D.  Fax: [phone]   Francisca December, M.D.  Fax: [phone]

## 2010-08-02 NOTE — Assessment & Plan Note (Signed)
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  Justin Garner has finished his radiation treatment approximately 2 weeks  ago for the aforementioned.  Currently, he has complaints of fatigue,  decreased appetite, sore throat, and a cough (without sputum  production).  He had a chest x-ray taken today which showed a stable  right apical pleural thickening and subpleural spiculation at the right  lung apex, consistent with scarring residual, right superior sulcus  tumor, which is decreased from prior, no metastatic disease seen.   PHYSICAL EXAMINATION:  VITAL SIGNS:  The patient's vital signs are as  follows:  BP is 115/75, pulse rate 84, respirations 18, O2 sat 96% on  room air.  CARDIOVASCULAR:  Regular rate and rhythm, S1-S2 without any  murmurs, gallops or rubs.  Pulmonary exam mostly clear.  No rales,  wheezes or rhonchi.  Mediastinoscopy scar is well-healed.  No signs of  infection.  Just under the right clavicle, there is a smaller area of  irritation.  It is with erythema but no discharge, purulence or  fluctuance.   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  The patient is going to have a CAT scan of the chest done  next week, and then will be scheduled for a right upper lobe chest wall  resection by Dr. Edwyna Shell on Aug 02, 2007, provided there are no new  abnormalities seen on CAT scan.  Please also note that the patient  stated he did have some changes in his medications, because of  hypotension.  He was taken off Diltiazem and benazepril.   Doree Fudge, PA   DZ/MEDQ  D:  07/09/2007  T:  07/09/2007  Job:  [id number]   cc:   Lajuana Matte, MD  Artist Pais. Kathrynn Running, M.D.

## 2010-08-02 NOTE — Letter (Signed)
May 01, 2007   Deirdre Peer. Polite, M.D.  [address]  [place], [place] [address]   Re:  Justin Garner, Justin Garner             DOB:  1933/01/03   Dear Dr. Nehemiah Settle:   I saw Justin Garner back in the office today.  Justin Garner has had a two-  year history of right anterior chest wall and pain down the inferior  portion of his right arm.  Justin Garner had a CT scan done in May of 2007 that was  negative but his pain has continued.  Justin Garner has been treated with pain  medication and had a repeat CT done which showed a Panacos tumor.  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  Pulmonary screening spirometry showed an FVC of 4.81 with an FEV1  of 2.85 or 75% of predicted.  Justin Garner has bullous disease in both apices.  Justin Garner  quit smoking six years ago, but has a long pack-year history.  No  headaches, blackouts, or seizures.  No hemoptysis, fever, chills,  excessive sputum.  No weakness in the right arm other than the pain down  the inferior portion of the arm along the ulnar distribution or the T1  distribution.   MEDICATIONS:  1. Zolpidem 10 mg daily for sleep.  2. Foradil Aerolizer 12 mcg daily.  3. Glyburide ER 10 mg.  4. Simvastatin 20 mg a day.  5. Omeprazole 20 mg a day.  6. Benazepril 20 mg a day.  7. Fenofibrate 67 mg daily.  8. Aspirin.  9. Coumadin 5 mg daily.  10.Levoxyl 112 mcg daily.  11.Diltiazem CR 180 mg daily.   Justin Garner has atrial fibrillation, hypertension, diabetes type 2,  hypercholesterolemia, hypothyroidism.  Justin Garner had a history of coronary  artery disease.   FAMILY HISTORY:  Noncontributory.   SOCIAL HISTORY:  Married with three children.  Retired.  Quit smoking in  2003.  Does not drink alcohol on a regular basis.   REVIEW OF SYSTEMS:  Justin Garner is 238 pounds.  Justin Garner is 6 feet 2 inches.  CARDIOVASCULAR:  Justin Garner has had some shortness of breath with exertion and  atrial fibrillation.  PULMONARY:   No hemoptysis, bronchitis, or asthma.  GASTROINTESTINAL:  Justin Garner has reflux.  GENITOURINARY:  No kidney disease.  VASCULAR:  No claudication, DVT, TIA.  NEUROLOGY:  No dizziness,  headaches, seizures.  MUSCULOSKELETAL:  Justin Garner has an occasional rash.  No  psychiatric illnesses, no change in his eye sight or hearing.  No  problems with anemia.  No clotting disorders.   PHYSICAL EXAMINATION:  GENERAL:  Justin Garner is a well-developed, Caucasian male  in no acute distress.  HEENT:  Unremarkable.  NECK:  Supple without thyromegaly.  There is no supraclavicular or  axillary adenopathy.  CHEST:  Clear to auscultation and percussion.  HEART:  Regular sinus rhythm.  No murmurs.  ABDOMEN:  Soft with no hepatosplenomegaly.  Pulses are 2+.  EXTREMITIES:  No clubbing or edema.  NEUROLOGY:  Justin Garner is alert and oriented x3.  Sensory and motor intact.  Cranial nerves II-XII grossly intact.   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  I  have explained to Justin Garner and his family in detail about Justin and I suggested  that Justin Garner get a needle biopsy, brain scan, full set of pulmonary function  tests and then I will refer him to Lajuana Matte, M.D. and Artist Pais. Kathrynn Running, M.D. or Billie Lade, M.D. for preoperative radiation  chemotherapy followed by a right upper lobectomy with chest wall  resection.  I think Justin Garner is a good candidate for Justin and I will inform  you as his workup proceeds.   Ines Bloomer, M.D.  Electronically Signed   DPB/MEDQ  D:  05/01/2007  T:  05/02/2007  Job:  [id number]

## 2010-08-02 NOTE — Discharge Summary (Signed)
NAMEREGINOLD, BEALE             ACCOUNT NO.:  [id number]   MEDICAL RECORD NO.:  [id number]          PATIENT TYPE:  INP   LOCATION:  2023                         FACILITY:  [facility]   PHYSICIAN:  Ines Bloomer, M.D. DATE OF BIRTH:  [birth year]   DATE OF ADMISSION:  08/01/2007  DATE OF DISCHARGE:  08/10/2007                               DISCHARGE SUMMARY   PRIMARY ADMITTING DIAGNOSIS:  Right upper lobe lung mass.   ADDITIONAL/DISCHARGE DIAGNOSES:  1. Right upper lobe lung mass with chest wall invasion, history of      adenocarcinoma.  2. Atrial fibrillation.  3. Hypertension.  4. Type 2 diabetes mellitus.  5. Hypercholesterolemia.  6. Hypothyroidism.  7. Coronary artery disease.  8. History of pulmonary embolism.  9. History of tobacco abuse.   PROCEDURE PERFORMED:  1. Right VATS.  2. Right thoracotomy.  3. Right upper lobectomy with chest wall resection.   HISTORY:  The patient is a 77 year old male who initially presented in  2007, with right anterior chest wall pain.  He underwent a CT scan at  that time, which was negative, but he continued to have pain.  Repeat CT  scan showed a right upper lobe Pancoast tumor involving the first to  sixth ribs posteriorly.  A PET scan at that time was negative for  metastatic disease.  A needle biopsy was performed, which was positive  for adenocarcinoma.  He underwent mediastinoscopy, which was negative.  He was treated with radiation and chemotherapy with decrease in the size  of the tumor.  He was recently seen by Dr. Edwyna Shell, and it was felt that  this time he should undergo right upper lobectomy with chest wall  resection.  The risks, benefits, and alternatives of the procedure were  explained to the patient and he agreed to proceed.   HOSPITAL COURSE:  He was admitted to [facility] on Aug 01, 2007, and underwent a right upper lobectomy with chest wall resection by  Dr. Edwyna Shell.  He tolerated the procedure well.  He was  transferred to the  SICU postoperatively in stable condition.  He had some mild confusion  while in the ICU and was treated with Haldol and Ativan.  He did return  back to his baseline mental status.  He also developed recurrence of  atrial fibrillation for which he had previously been treated as an  outpatient.  He was seen by [facility] and was started on  amiodarone and was started back on Coumadin.  He did convert to normal  sinus rhythm.  He remained stable and was able to be transferred to the  step-down unit.  His chest tubes were removed in the usual fashion and  followup chest x-rays have remained stable with no significant  pneumothorax.  He did have recurrence of the atrial fibrillation and was  treated with a bolus of IV amiodarone and his p.o. dose was increased.  Since that time, he has maintained normal sinus rhythm for at least the  past 24 hours.  He is otherwise doing well.  From a pulmonary  standpoint, he has  remained stable and is off oxygen.  His incisions are  all healing well.  His chest tube sutures have been removed and all his  incisions were healing well.  He has remained afebrile and his other  vital signs have been stable.  His most recent labs show a protime of  15.8, INR of 1.2, sodium 138, potassium 4.4, BUN 9, and creatinine 1.16.  White count 10.7, hemoglobin 10.9, hematocrit 32.5, and platelets 467.  It is felt that he can be discharged home at this time since he has  continued to progress and he may be followed up as an outpatient for his  atrial fibrillation.   Discharge medications are as follows:  1. Niferex 150 mg daily.  2. Tylox 1-2 q.6 h. p.r.n. pain.  3. Amiodarone 400 mg b.i.d. x10 days then 400 mg daily.  4. Coumadin.  He is to continue 7.5 mg daily until blood work is      checked.  5. Glipizide ER 10 mg daily.  6. Simvastatin 20 mg nightly.  7. Omeprazole 20 mg daily.  8. Fenofibrate 67 mg daily.  9. Aspirin 81 mg daily.   10.Levoxyl 0.112 mg daily.   DISCHARGE INSTRUCTIONS:  He is asked to refrain from driving, heavy  lifting, or strenuous activity.  He may continue ambulating daily and  using his incentive spirometer.  He may shower daily and clean his  incisions with soap and water.  He will continue with same preoperative  diet.   DISCHARGE FOLLOWUP:  He will see Dr. Edwyna Shell back in the office in 1 week  with a chest x-ray.  He will follow up at [facility] Office on Aug 13, 2007, for an INR check and will see Dr. Amil Amen on August 23, 2007.  In  the interim if he experiences problems or has questions, he is asked to  contact our office immediately.      Coral Ceo, P.A.      Ines Bloomer, M.D.  Electronically Signed    GC/MEDQ  D:  08/10/2007  T:  08/10/2007  Job:  [id number]   cc:   Francisca December, M.D.  Lajuana Matte, MD  Deirdre Peer. Polite, M.D.  Dr. Kathrynn Running

## 2010-08-02 NOTE — Assessment & Plan Note (Signed)
OFFICE VISIT   Justin Garner, Justin Garner  DOB:  20-Apr-1932                                        October 23, 2007  CHART #:  [id number]   Blood pressure was 112/72, pulse 76, respirations 18, and sats were 95%.  Lungs are clear to auscultation and percussion.  His incision is well  healed.  He is having still moderate postoperative thoracotomy pain.  He  still has plenty of pain medications, but told him we would refill it  for at least another couple of months.  I plan to see him back again in  6 weeks with another chest x-ray.  His chest x-ray today showed normal  postoperative change.   Ines Bloomer, M.D.  Electronically Signed   DPB/MEDQ  D:  10/23/2007  T:  10/23/2007  Job:  [id number]

## 2010-08-02 NOTE — Assessment & Plan Note (Signed)
OFFICE VISIT   Justin Garner, Justin Garner  DOB:  04/06/1932                                        August 21, 2007  CHART #:  [id number]   The patient came for followup today.  He was still having some pain in  his arm.  His blood pressure is 117/63, pulse 84, respirations 18, and  sats were 96%.  Chest x-ray showed normal postop changes.  He is doing  well overall.  I told him to start driving, gradually increase his  activities, and start exercising his arm.  Gave him a refill for Tylox  #60, and I will see him back in 3 weeks with a chest x-ray.   Ines Bloomer, M.D.  Electronically Signed   DPB/MEDQ  D:  08/21/2007  T:  08/22/2007  Job:  [id number]   cc:   Lajuana Matte, MD  Artist Pais. Kathrynn Running, M.D.

## 2010-08-05 NOTE — Cardiovascular Report (Signed)
NAME:  Justin Garner, Justin Garner                       ACCOUNT NO.:  [id number]   MEDICAL RECORD NO.:  [id number]                   PATIENT TYPE:  OIB   LOCATION:  2872                                 FACILITY:  [facility]   PHYSICIAN:  Francisca December, M.D.               DATE OF BIRTH:  02-25-1933   DATE OF PROCEDURE:  08/07/2002  DATE OF DISCHARGE:  08/07/2002                              CARDIAC CATHETERIZATION   PROCEDURE PERFORMED:  1. Left heart catheterization.  2. Coronary angiography.  3. Left ventriculogram.   INDICATIONS:  The patient is a 77 year old man with known ASCVD one year  status post PTCA stent in the distal LAD and proximal left circumflex.  These were bare metal stents.  He underwent a myocardial perfusion study in  December of 2003 showing some reversible basal inferolateral and inferior  changes.  He has persistently complained of dyspnea on exertion.  A cardiac  catheterization was recommended in February of 2004 but because of  intervening health problems it could not be completed until this time.   PROCEDURAL NOTE:  The patient was brought to the cardiac catheterization  laboratory in a fasting state.  The right groin was prepped and draped in  the usual sterile fashion.  Local anesthesia was obtained with the  infiltration of 1% lidocaine.  A 5 French catheter sheath was inserted  percutaneously into the right femoral artery utilizing an anterior approach  over a guiding J-wire.  A 110 cm pigtail catheter was used to measure  pressures in the ascending aorta and in the left ventricle both prior and  following the ventriculogram.  A 30 degree RAO cineangiogram left  ventriculogram was performed using a power injector.  Following the  sublingual administration of 0.4 mg nitroglycerin cineangiography of each  coronary artery was conducted in multiple LAO and RAO projections utilizing  5 [place] number 4 left and right Judkins catheters.  At the completion of  the  procedure the catheter and catheter sheaths were removed.  Hemostasis  was achieved by correct pressure.  The patient was transported to the  recovery area in stable condition with an intact distal pulse.   HEMODYNAMICS:  Systemic arterial pressure was 137/75 with a mean of 100  mmHg.  There was no systolic gradient across the aortic valve.  The left  ventricular end-diastolic pressure was 15 mmHg pre and post ventriculogram.   ANGIOGRAPHY:  The left ventriculogram demonstrated normal chamber size and  normal global systolic function with a calculated ejection fraction  utilizing a single plane cineangiogram method of 56%.  There was focal  apical akinesis.  There was 1+ mitral regurgitation and coronary  calcification both right and left.   There was a right dominant coronary system present.  The main left coronary  artery was diffusely narrowed about 30%.   The left anterior descending artery and its branches were mildly diseased.  The vessel gives rise  to two large diagonal branches without significant  obstruction.  The main trunk of the anterior descending area demonstrates  diffuse disease with multiple 20 and 30% stenoses.  Despite a previous stent  implantation in a distal vessel it is widely patent.  There is a 30-40%  stenosis at the distal end of the stent.  Downgoing anterior descending  artery is just barely transapical.   The left circumflex coronary artery and its branches were mildly diseased.  The vessel has a stent in the proximal segment which is widely patent.  There are luminal irregularities and multiple 20 and 30% stenoses throughout  the remainder of the vessel.  There is a very small first marginal branch  twig-like in size.  The second marginal branch is large and bifurcates on  the lateral wall of the heart from the mid to the apical portion.  No  significant structures are seen in this vessel.  The ongoing left circumflex  in the AV groove is small and  gives rise only to one small posterolateral  branch.   The right coronary artery and its branches were minimally diseases.  The  vessel, again, is diffusely diseased with multiple 20 and 30% stenoses  throughout the course of the vessel from the ostium to the crux.  There is a  proximal discreet 40% narrowing.  The vessel bifurcates into a large  posterior descending artery and fairly large posterolateral segment which  gives rise to two moderate sized left ventricular branches.  The posterior  descending artery bifurcates proximally into a small left ventricular branch  and then a more moderate sized left ventricular branch and then the ongoing  posterior descending artery reaches the apex.  There is a 30% stenosis at  the crux of the right coronary just before the bifurcation.  There are no  significant obstructions in the posterolateral segment/LV branches or in the  posterior descending artery.   Collateral vessels are not seen.   FINAL IMPRESSION:  1. Nonobstructive atherosclerotic coronary vascular disease.  2. Intact left ventricular size and global systolic function with regional     wall motion abnormalities noted.  3. Normal left ventricular end-diastolic pressure.   PLAN/RECOMMENDATION:  No epicardial coronary etiology for the patient's  persistent dyspnea and abnormal Cardiolite is identified.  Medical therapy  only is indicated.  Risk factor modification is being aggressively pursued.                                               Francisca December, M.D.    JHE/MEDQ  D:  08/07/2002  T:  08/08/2002  Job:  [id number]

## 2010-08-05 NOTE — Op Note (Signed)
NAME:  JAIMON, BUGAJ                       ACCOUNT NO.:  [id number]   MEDICAL RECORD NO.:  [id number]                   PATIENT TYPE:  INP   LOCATION:  5735                                 FACILITY:  [facility]   PHYSICIAN:  Bernette Redbird, M.D.                DATE OF BIRTH:  [birth year]   DATE OF PROCEDURE:  05/31/2002  DATE OF DISCHARGE:                                 OPERATIVE REPORT   PROCEDURE PERFORMED:  Endoscopic retrograde cholangiopancreatography  (attempted).   ENDOSCOPIST:  Bernette Redbird, M.D.   INDICATIONS FOR PROCEDURE:  This is a 77 year old gentleman who presented to  the [facility] this morning with jaundice and an ultrasound in the  emergency room here showed multiple gallstones, possible a gallstone in the  neck of the gallbladder, a nondilated biliary tree, but a questionable stone  in the distal common duct.   FINDINGS:  Normal papilla and PD, but unable to cannulate the common bile  duct despite prolonged efforts.   DESCRIPTION OF PROCEDURE:  The major purpose and risks of the procedure had  been carefully discussed with the patient and his wife, including risks of  pancreatitis, bleeding, perforation, need for emergency surgery and even  occasional mortality.  He provided written consent and was brought from the  emergency room to the radiology suite following Rocephin 1 gram IV as  antibiotic prophylaxis.  Topical pharyngeal anesthesia with lidocaine spray  was followed by intravenous sedation totally fentanyl 160 mcg and versed 16  mg IV prior to and during the course of this 1.5 hour procedure.   The Olympus diagnostic video duodenoscope was passed easily into the  esophagus blindly and advanced into what appeared to be a grossly normal-  appearing stomach and then traversing into the second duodenum where the  major and minor papillae were identified.  The major papilla had a normal  appearance.  There was no bile in the duodenal lumen and,  in fact,  throughout this hour-and-a-half procedure I never saw any bile other than  just some minimal wisps drained from the punctum.   Over the next hour-and-a-half I tried repeatedly to cannulate the common  duct.  Unfortunately, we had a situation of preferential entry into the  pancreatic duct.  We used a triple lumen sphincterotome at first loaded with  a standard guide wire.  We only injected contrast on three occasions, just  very small amounts, to confirm that we were indeed in the pancreatic duct.  The rest of the time exploration and confirmation of position was done  fluoroscopically with the guide wire.  We switched to a smaller caliber  guide wire and we also repositioned the patient in the left lateral  decubitus position, but despite all these maneuvers short sticking the  scope, long sticking the scope, bowing the sphincterotome, and probing  essentially the entire surface of the punctum we were never able to  cannulate the common duct.  Ultimately, it was felt that further attempts  would not likely be successful, so the procedure was terminated and the  scope was removed from the patient who tolerated the procedure well, and  without any apparent complication.   IMPRESSION:  1. Unsuccessful procedure; specifically, unable to cannulate the common     duct.  2. Grossly normal-appearing pancreatic duct, although intentionally it was     not fully opacified so as to minimize contrast exposure.  I might add     that drainage of contrast was allowed intermittently during attempts at     cannulation and confirmed fluoroscopically.  3. Normal-appearing papilla; no evidence of ampullary tumor.   PLAN:  Surgical consultation.  It is anticipated the patient will need  cholecystectomy in view of his documented cholelithiasis and his jaundice;  perhaps, if the common duct is confirmed on intraoperative cholangiography  it would be able to be removed laparoscopically.                                                  Bernette Redbird, M.D.    RB/MEDQ  D:  05/31/2002  T:  06/02/2002  Job:  [id number]   cc:   Ike Bene, M.D.  [address]  [place]  [place] [address]  Fax: [phone]   Francisca December, M.D.  [address]  [place]  [place] [address]  Fax: [phone]   Marcelyn Bruins, M.D. [facility]   Jimmye Norman III, M.D.  [address], [address]  [place]  [place] [address]  Fax: [phone]

## 2010-08-05 NOTE — Op Note (Signed)
NAME:  Justin Garner, Justin Garner                       ACCOUNT NO.:  [id number]   MEDICAL RECORD NO.:  [id number]                   PATIENT TYPE:  INP   LOCATION:  5735                                 FACILITY:  [facility]   PHYSICIAN:  Vikki Ports, M.D.         DATE OF BIRTH:  1932/07/11   DATE OF PROCEDURE:  06/02/2002  DATE OF DISCHARGE:  06/03/2002                                 OPERATIVE REPORT   PREOPERATIVE DIAGNOSIS:  Choledocholithiasis and cholelithiasis.   POSTOPERATIVE DIAGNOSIS:  Choledocholithiasis and cholelithiasis.   PROCEDURE:  Laparoscopic cholecystectomy with intraoperative cholangiogram.   SURGEON:  Vikki Ports, M.D.   ASSISTANT:  Abigail Miyamoto, M.D.   ANESTHESIA:  General.   DESCRIPTION OF PROCEDURE:  The patient was taken to the operating room and  placed in the supine position.  After adequate general anesthesia was  induced using endotracheal tube, the abdomen was prepped and draped in a  normal sterile fashion.  Using a transverse infraumbilical incision, I  dissected down to the fascia.  Fascia was opened vertically.  An 0 Vicryl  pursestring suture was placed around the fascial defect.  A Hasson trocar  was placed in the abdomen and pneumoperitoneum was obtained.  A subxiphoid  10 mm trocar was placed under direct visualization and two 5 mm ports were  placed in the right abdomen.  The gallbladder was quite distended and was  therefore aspirated.  It was then retracted cephalad.  The infundibulum was  identified, as was the cystic duct.  Good dissection of the cystic duct was  accomplished with visualization of the liver posterior to it.  The  gallbladder side was clipped and ductotomy was created.  A Reddick catheter  was then placed down through the ductotomy and cholangiogram was performed.  I had to use a significant amount of pressure but then had free flow.  On  visualization the patient had dilated common bile duct but a normal  ampulla  and flow into the duodenum.  I also saw the right and left hepatic ducts  well.  The catheter was then removed.  The cystic duct was then doubly  clipped and divided.  The cystic artery was also dissected in a similar  fashion, triply clipped, and divided.  The gallbladder was taken off the  gallbladder bed using Bovie electrocautery and removed through the umbilical  port.  Adequate hemostasis was insured.  The infraumbilical fascial defect  was closed with the 0 Vicryl pursestring suture, skin incisions were closed  with staples.  The patient tolerated the procedure well and went to PACU in  good condition.                                               Vikki Ports, M.D.    KRH/MEDQ  D:  06/10/2002  T:  06/11/2002  Job:  [id number]   cc:   Bernette Redbird, M.D.  [address] [place]  [place], [place] [address]  Fax: [phone]

## 2010-08-05 NOTE — Cardiovascular Report (Signed)
[place]  Patient:    Justin Garner, Justin Garner Visit Number: [id number] MRN: [id number]          Service Type: CAT Location: [id number] Attending Physician:  Corliss Marcus Dictated by:   Francisca December, M.D. Proc. Date: 08/06/01 Admit Date:  08/06/2001 Discharge Date: 08/07/2001   CC:         Jerl Santos, M.D.  Cardiac Catheterization Laboratory   Cardiac Catheterization  PROCEDURES PERFORMED: 1. PCI/stent implantation of distal LAD. 2. PCI/stent implantation of proximal circumflex. 3. Left femoral arteriogram. 4. Percutaneous closure of right femoral artery.  INDICATIONS:  The patient is a 77 year old man with worsening exertional dyspnea and some chest tightness who underwent myocardial perfusion imaging with exercise.  The patient was unable to complete stage II of the Bruce protocol.  Myocardial perfusion imaging was unremarkable.  Despite this, he was taken for coronary angiography which has revealed an 80% distal LAD stenosis, and a 70% proximal circumflex stenosis.  He is brought to the cardiac catheterization laboratory at this time for catheter base revascularization.  PROCEDURAL NOTE:  PCI was performed following the percutaneous insertion of a 7-French catheter sheath utilizing an anterior approach over a guiding J-wire into the right femoral artery.  The patient then received 5200 units of heparin with a subsequent ACT of 264 seconds.  He was also administered a double bolus of Integrilin in constant infusion during the procedure.  A 7-French CSL 3.5 Sci-Med Mark I guiding catheter was advanced to the ascending aorta where the left coronary os was engaged.  A 0.014 inch Sci-Med Luge intracoronary guidewire was then passed across the lesion of the distal LAD without difficulty.  The lesion was primarily stented using a 3.5/13 mm ACS guidant Zeta intracoronary stent.  This was deployed at a peak pressure of 20 atmospheres  for a total of 87 seconds.  These maneuvers resulted of wide patency of the distal LAD, confirmed in orthogonal views, both with and without the guidewire in place.  The guidewire was withdrawn and then advanced down the circumflex.  Initial balloon dilatation was performed with the circumflex lesion using the 3.5/13 mm Zeta balloon.  This was then inflated to eight atmospheres across the proximal segment.  We deflated after one minute and removed, and a 4.0/12 mm Sci-Med Express II intracoronary stent was positioned carefully across the lesion.  The stent was deployed at 16 atmospheres for 36 seconds.  The stent balloon was removed and a 4.0/8 mm Sci-Med Quantum Maverick intracoronary balloon was advanced into the midportion of the stent segment.  It was inflated twice to a maximum pressure of 20 atmospheres for a maximum duration of 32 seconds.  These maneuvers resulted in wide patency of the left circumflex, confirmed on orthogonal views, both with and without the guidewire in place.  The guiding catheter was then removed.  Via the right femoral artery sheath, a right femoral arteriogram was performed in a 45 degree RAO projection.  This confirmed the femoral artery to be widely patent without significant obstruction or atherosclerosis, and the arteriotomy site was superior to the bifurcation into the profunda femoris and the superficial femoral arteries.  We therefore proceeded with percutaneous closure of the artery using the Perclose system.  Excellent hemostasis was obtained.  The patient was transported to the recovery area in stable condition with an intact distal pulse.  ANGIOGRAPHY:  As mentioned, the lesions treated in the distal LAD and the proximal left circumflex.  The distal LAD was 80% stenotic and tubular, short in fashion in configuration.  Following balloon dilatation and stent implantation, there was no residual stenosis.  The proximal circumflex stenosis was  60-70% stenotic.  Following balloon dilatation and stent implantation, again, there was no residual stenosis.  FINAL IMPRESSION: 1. Atherosclerotic cardiovascular disease, two vessel. 2. Status post successful percutaneous coronary intervention of distal left    anterior descending and proximal left circumflex coronary arteries. 3. Successful percutaneous closure of right femoral artery. Dictated by:   Francisca December, M.D. Attending Physician:  Corliss Marcus DD:  08/06/01 TD:  08/08/01 Job: [id number]

## 2010-08-05 NOTE — Discharge Summary (Signed)
   NAME:  Justin Garner, Justin Garner.                      ACCOUNT NO.:  [id number]   MEDICAL RECORD NO.:  [id number]                   PATIENT TYPE:  INP   LOCATION:                                       FACILITY:  [facility]   PHYSICIAN:  Vikki Ports, M.D.         DATE OF BIRTH:  [birth year]   DATE OF ADMISSION:  05/31/2002  DATE OF DISCHARGE:  06/03/2002                                 DISCHARGE SUMMARY   ADMISSION DIAGNOSES:  1. Choledocholithiasis and cholelithiasis.  2. Coronary artery disease.  3. Chronic obstructive pulmonary disease.  4. History of gastroesophageal reflux disease.   DISCHARGE DIAGNOSES:  1. Cholelithiasis and cholelithiasis.  2. Coronary artery disease.  3. Chronic obstructive pulmonary disease.  4. History of gastroesophageal reflux disease.   CONDITION ON DISCHARGE:  Good and improved.   DISPOSITION:  The patient is discharged to home.   DISCHARGE MEDICATION:  Vicodin p.r.n. for pain.   HISTORY OF PRESENT ILLNESS:  Briefly, the patient is a 77 year old male who  presented to the emergency room and was seen by Dr. Bernette Redbird.  The  patient was noted to have jaundice, total bilirubin of 7.3, and elevated  transaminases.  He had minimal symptoms but was admitted by Dr. Matthias Hughs.  For the full H&P, please see the chart.   HOSPITAL COURSE:  He was admitted, started on IV antibiotics, underwent  ERCP, but was unable to cannulate the common bile duct.  He underwent  cardiac evaluation for preoperative clearance and was cleared for surgery.  Surgical consultation was obtained by Dr. Carolynne Edouard.  He was maintained on  antibiotics for the following two to three days and scheduled for surgery.   On the following hospital day, he was taken to the operating room where he  underwent laparoscopic cholecystectomy with intraoperative cholangiogram  which showed dilated duct but passage of the stone into the duodenum with  balloon maneuvers.   Postoperatively, he did  well.  He was started on a clear liquid diet and was  advanced to a regular diet which he tolerated well and on postoperative day  #1, he was discharged to home.                                                Vikki Ports, M.D.    KRH/MEDQ  D:  10/27/2002  T:  10/28/2002  Job:  [id number]   cc:   Francisca December, M.D.  [address]  [place]  [place] [address]  Fax: [phone]

## 2010-08-05 NOTE — Consult Note (Signed)
NAME:  Justin Garner, AGUINIGA                       ACCOUNT NO.:  [id number]   MEDICAL RECORD NO.:  [id number]                   PATIENT TYPE:  INP   LOCATION:  5735                                 FACILITY:  [facility]   PHYSICIAN:  Ollen Gross. Vernell Morgans, M.D.              DATE OF BIRTH:  08-04-1932   DATE OF CONSULTATION:  05/31/2002  DATE OF DISCHARGE:                                   CONSULTATION   CHIEF COMPLAINT:  I turned yellow and my belly hurt.   HISTORY OF PRESENT ILLNESS:  The patient is a 77 year old white male with a  history of coronary artery disease, who for the first time developed acute  onset of back and right upper quadrant pain this past Wednesday.  The pain  was associated with some nausea and vomiting.  He has also had a few loose  stools lately.  He denied any fevers.  The pain has since resolved, but on  evaluation he was found to be jaundiced.  He then underwent a right upper  quadrant ultrasound that showed stones in the gallbladder, but no evidence  of biliary dilatation.  He was thought to have a common bile duct stone  obstructing the biliary system.  He was taken today for ERCP study at which  time Bernette Redbird, M.D., was unable to image the bile ducts and remove any  stones.  At this point, the ERCP procedure was stopped.  He currently has no  complaints.  His pain is pretty much resolved and he is hungry.   REVIEW OF SYSTEMS:  He denies any current nausea, vomiting, fever, chills,  or chest pain.  He does get some short of breath with exertion, but he is  pretty active at home and only occasionally has these feelings of shortness  of breath.  He does have nitroglycerin to take at home, but does not take  any.  He has also had a few loose bowel movements recently, but otherwise  his bowels have been working normally.  The rest of his review of systems is  fairly unremarkable.   PAST MEDICAL HISTORY:  Significant for coronary artery disease and recent  jaundice.   PAST SURGICAL HISTORY:  Significant for an inguinal hernia repair about 10  years ago.   MEDICATIONS:  Aspirin.   ALLERGIES:  No known drug allergies.   SOCIAL HISTORY:  He quit smoking about a year ago and denies any alcohol  use.   FAMILY HISTORY:  Noncontributory.   PHYSICAL EXAMINATION:  GENERAL APPEARANCE:  He is a well-developed, well-  nourished, white male in no acute distress.  SKIN:  Warm and dry and slightly yellow colored.  HEENT:  Eyes:  His extraocular movements are intact..  Pupils equal, round,  and reactive to light.  NECK:  No bruits.  I cannot palpate any masses.  The trachea is midline.  LUNGS:  Clear bilaterally.  HEART:  Regular rate and rhythm.  ABDOMEN:  Soft and nontender.  I cannot palpate any masses or  hepatosplenomegaly.  EXTREMITIES:  No cyanosis, clubbing, or edema.  PSYCHOLOGIC:  He was alert and oriented x 3.   LABORATORY DATA:  On review of his blood work, his amylase is 62.  His white  count is 7000, hemoglobin 16.3, hematocrit 46.7, and platelet count 246,000.  His sodium is 139, potassium 4.1, chloride 110, CO2 24, BUN 14, creatinine  1.3, glucose 108, total bilirubin 7.3, alkaline phosphatase 166, SGOT 96,  and SGPT 131.  His right upper quadrant ultrasound is as described above.   ASSESSMENT AND PLAN:  This is a 77 year old white male with a history of  coronary artery disease who now appears to be a common bile duct stone  obstructing the biliary system.  He has had a failed ERCP and will need  operative intervention for this.  He was scheduled to have a cardiac  catheterization by Francisca December, M.D., this week and we will consult Dr.  Amil Amen to evaluate his risk for surgery.  His options for surgery will  include possible laparoscopic cholecystectomy with exploration of the common  duct with balloon catheters to try to extract the common duct stone.  If  this is unsuccessful, he may need an open exploration of his  common duct  with choledochotomy and attempts at removal of the stone with graspers.  If  this does not work, he may need either a choledochoduodenostomy or a  choledochojejunostomy to reroute his biliary drainage into his small bowel.  I have discussed the situation and the surgeries, including the risks and  benefits with him and his family and they understand and agree to proceed  with this course of management.  We will follow him closely and await the  final words from the cardiology before planning his surgery.                                               Ollen Gross. Vernell Morgans, M.D.    PST/MEDQ  D:  05/31/2002  T:  06/02/2002  Job:  [id number]

## 2010-08-05 NOTE — H&P (Signed)
NAME:  Justin Garner, FANNING NO.:  [id number]   MEDICAL RECORD NO.:  [id number]                   PATIENT TYPE:  INP   LOCATION:  5735                                 FACILITY:  [facility]   PHYSICIAN:  Bernette Redbird, M.D.                DATE OF BIRTH:  12/17/1932   DATE OF ADMISSION:  05/31/2002  DATE OF DISCHARGE:                                HISTORY & PHYSICAL   IDENTIFICATION:  This 77 year old white male is being admitted to the  hospital because of jaundice and gallstones.   HISTORY OF PRESENT ILLNESS:  The patient presented to the [facility] this morning because of a roughly two-day history of jaundice.  The  other evening he had pain in the right back and right lower quadrant of his  abdomen, not really the right upper quadrant.  No fevers or chills nor any  nausea or vomiting, although he did have some degree of anorexia.  These  symptoms occurred through the night while he was working third shift at the  [employer].  He went home, took a couple of Aleve, went back  to bed, and the pain went away.  Over the next couple of days he began to  notice jaundice.  He was seen by his cardiologist, Dr. Corliss Marcus, a  couple of days ago during this time, at which time jaundice was not noticed.   Because of the apparent jaundice when seen at the walk-in clinic, however,  the patient was sent to the [facility] Emergency Room, and I was asked to see the  patient.  An ultrasound was obtained which showed multiple gallstones,  possibly a gallstone in the neck of the gallbladder, a positive Murphy's  sign by ultrasound, and a probable stone in the distal common bile duct  although there was no biliary ductal dilatation.   Laboratory data (see detailed list below) pertinent for elevated liver  chemistries with a bilirubin of 7.3 and a normal white count and amylase.   Inpatient management including ERCP and probable cholecystectomy were,  therefore, felt to be appropriate.   ALLERGIES:  No known allergies.   OUTPATIENT MEDICATIONS:  (Doses not known since neither the patient nor wife  brought medications with them.)  Prevacid once daily.  Wellbutrin.  Synthroid.  Tricor.  Lipitor.  What sounds like inhaled Chromelin started  recently by Dr. Shelle Iron.  Daily baby aspirin.   OPERATION:  Remote hernia repair.   CHRONIC MEDICAL ILLNESSES:  Coronary disease, with no history of MI but  status post PTCA and stent placement approximately a year ago.  Also, there  is a history of COPD.  No hypertension or diabetes.   HABITS:  The patient stopped smoking a year ago with the help of Wellbutrin,  which he still takes.  Nondrinker.   FAMILY HISTORY:  Possible gallstones in his mother.  Negative for colon  cancer or ulcers.   SOCIAL HISTORY:  The patient is married and is accompanied by his wife to  the emergency room.  He works at the [employer] on third shift.   REVIEW OF SYSTEMS:  Negative for frank anorexia (he has not been very hungry  recently with this current illness), weight loss, dysphagia, or current  reflux symptoms although in the past he has had acid reflux well controlled  by Prevacid.  No constipation or diarrhea.  He has had occasional rectal  bleeding.  Note that endoscopy and colonoscopy were performed several years  ago in May 2001, by Dr. Sabino Gasser.  I looked up those reports.  The  endoscopy showed some distal esophagitis (biopsies negative for intestinal  metaplasia or frank Barrett's although it looked like Barrett's  endoscopically), and the colonoscopy showed a small hyperplastic polyp.  Note that the patient and family were offered the opportunity of seeing Dr.  Virginia Rochester today, but at this point they were happy to have me continue with seeing  the patient.   PHYSICAL EXAMINATION:  GENERAL:  This is a lean, tall, healthy-appearing,  obviously jaundiced Caucasian male in no evident distress.   Neither anxious  nor depressed.  VITAL SIGNS:  Temperature 97.4, blood pressure 126/68, pulse 51,  respirations 20, O2 saturation 97% on room air.  HEENT:  Scleral icterus.  Oropharynx benign.  No cervical adenopathy or  obvious thyromegaly.  CHEST:  Slightly coarse breath sounds.  No overt wheezes, rales, or  prolongation of expiration.  HEART:  No gallops, rubs, murmurs, clicks, or arrhythmias appreciated.  The  pulse sounded like it was about 65 or 68 at the time of my auscultation.  ABDOMEN:  Active bowel sounds.  No bruits.  No organomegaly, guarding, mass,  or tenderness.  Liver span normal by scratch test.  In particular, at this  time I do not elicit Murphy's sign or right upper quadrant tenderness.  RECTAL:  Shows normal prostate and no masses, an empty rectal ampulla with  mucoid heme-negative stool.  NEUROLOGIC:  The patient is alert, oriented, coherent, and appropriate,  without obvious cranial nerve or focal motor deficits.   LABORATORY DATA:  White count 7000 with 55 polys, 29 lymphs, and 11  monocytes.  Hemoglobin is 16.3, with an MCV of 87, platelet count 246,000.  Chemistry panel pertinent for normal glucose and renal function and elevated  liver chemistries with total bilirubin of 7.3, alkaline phosphatase 166,  SGOT 96, SGPT 131, albumin is 4.  Amylase is normal at 62.  BUN 14,  creatinine 1.3, glucose 108.   Ultrasound:  I reviewed the ultrasound with Dr. Jolaine Click.  There are  multiple gallstones including one apparently in the neck of the gallbladder.  In addition, there is what appears to be a stone in the distal common duct,  although the common duct is not dilated, with a diameter of about 5 mm.   IMPRESSION:  1. Recent onset jaundice.  2. Cholelithiasis and probable choledocholithiasis.  3. Relatively minor gastrointestinal symptoms of uncertain relevance to the     biliary tract disease. 4. Coronary artery disease.  Of note, the patient's presenting  symptoms a     year ago were dyspnea on exertion, and he started to have that again.  He     has no severe dyspnea and absolutely no exertional chest pain.  He is     able to walk a block or so, or certainly in  from the parking lot without     any symptoms.  5. Chronic obstructive pulmonary disease, under treatment by Dr. Shelle Iron.  6. History of various medical problems without records being available at     the moment, apparently including some sort of thyroid condition,     hyperlipidemia, chronic obstructive pulmonary disease, and coronary     disease.   DISCUSSION AND PLAN:  The elevated liver chemistries and apparent  choledocholithiasis by ultrasound, in my opinion, are strong indications for  doing ERCP.  The patient will presumably also need laparoscopic  cholecystectomy, but I think it is appropriate to try to clear the common  duct first.  The nature, purpose, risks, and alternatives of ERCP,  sphincterotomy, and stone extraction were carefully discussed with the  patient and his wife with the aid of a diagram, and he is agreeable to  proceed.  The patient will be admitted to the hospital to a regular floor  bed either prior to or subsequent to the ERCP, and it is anticipated  surgical consultation will be obtained thereafter.   Cardiology evaluation will possibly be needed if it is decided to do  gallbladder surgery on this admission.  We will possibly also need pulmonary  consultation for the same reason.   At the moment the patient is pain-free, so pain medications are not needed.  Preprocedural antibiotics will be administered, but I am not sure we will  need ongoing antibiotic therapy.                                               Bernette Redbird, M.D.    RB/MEDQ  D:  05/31/2002  T:  06/01/2002  Job:  [id number]   cc:   Ike Bene, M.D.  [address]  [place]  [place] [address]  Fax: [phone]   Francisca December, M.D.  [address]  [place]  [place] [address]  Fax: [phone]   Marcelyn Bruins, M.D. [facility]

## 2010-08-05 NOTE — Consult Note (Signed)
NAME:  CORBITT, CLOKE NO.:  [id number]   MEDICAL RECORD NO.:  [id number]                   PATIENT TYPE:  INP   LOCATION:  5735                                 FACILITY:  [facility]   PHYSICIAN:  Vesta Mixer, M.D.              DATE OF BIRTH:  [birth year]   DATE OF CONSULTATION:  05/31/2002  DATE OF DISCHARGE:                                   CONSULTATION   HISTORY OF PRESENT ILLNESS:  Mr. Trentman is a 77 year old gentleman with a  history of coronary artery disease, status post PTCA and stenting in the  past.  He was admitted with an obstruction of his common duct with a  gallstone.  Today he had an unsuccessful ERCP.  He had previously been  scheduled to have a heart catheterization next week and we were asked to see  him for preoffice cardiology evaluation.   The patient has a history of coronary artery disease dating back to last  year.  He had an angioplasty.  The exact details of that procedure are not  known.  He has done well and he has been able to stop smoking since that  time.  He has continued to work fairly active job at [employer]. He works on  a regular basis on third shift.  He has not had any episodes of angina.  He  does all his normal activities including climbing stairs and carrying in the  groceries without any significant problems.  He has a history of COPD and  has had some dyspnea with exertion.  Because of this dyspnea, Dr. Amil Amen  had scheduled him for a heart catheterization next Friday.   This past Wednesday he had some abdominal and flank pain.  The following  morning he woke up and noticed that he was quite yellow.  He presented to  his daughter and eventually was found to have an obstruction of his common  duct.  Dr. Matthias Hughs attempted ERCP today but was unsuccessful in dislodging  the stone.  He his now being considered for laparoscopic cholecystectomy for  resolution of this common duct obstruction.   CURRENT  MEDICATIONS:  Enteric-coated aspirin once a day.   PAST MEDICAL HISTORY:  1. Coronary artery disease, status post PTCA and stenting.  2. History of jaundice.   SOCIAL HISTORY:  The patient quit smoking one year ago.   FAMILY HISTORY:  Noncontributory.   REVIEW OF SYSTEMS:  Negative.   PHYSICAL EXAMINATION:  GENERAL:  He is a middle-aged gentleman in no acute  distress.  He is alert and oriented x 3 and his mood and affect are normal.  VITAL SIGNS:  Heart rate is 55.  HEENT:  2+ carotids.  He has no bruits.  No JVD.  LUNGS:  Clear to auscultation.  HEART:  Regular rate.  S1 and S2 with no murmurs, gallops or rubs.  ABDOMEN:  Good bowel sounds and  is nontender.  EXTREMITIES:  No clubbing, cyanosis or edema.  NEUROLOGIC:  Nonfocal.  He is somewhat somnolent here today.   LABORATORY DATA:  Pending.  His EKG reveals sinus bradycardia.  There are small and probably  insignificant Q waves in the inferior leads.  He has no ST-T wave changes.   ASSESSMENT AND PLAN:  Mr. Stickler presents with mild dyspnea but he has been  able to do most of his usual activities without any problems.  Using the  [facility] criteria, he is at low risk for his upcoming abdominal surgery.   Another way of thinking about this is that he will need to have abdominal  surgery within the next several days.  If we cath him and find that his  arteries are clean, then he would be able to go to surgery without any  problems.  If we perform the catheterization and find that he needs further  angioplasty and stenting, then we will need to place him on Plavix.  Placing  him on Plavix may put him at greater risk for perioperative bleeding and  hematomas.  I do not think we will have the time to allow him to heal up in  the stent before he needs to have the belly surgery to relieve his common  duct obstruction.  Clinically he appears to be quite stable and he really is not having  significant episodes of angina.  He should  be at relatively low risk for his  upcoming bowel surgery.                                               Vesta Mixer, M.D.    PJN/MEDQ  D:  05/31/2002  T:  06/02/2002  Job:  [id number]   cc:   Francisca December, M.D.  [address]  [place]  [place] [address]  Fax: [phone]   Bernette Redbird, M.D.  [address]  [place], [place] [address]  Fax: [phone]   Ollen Gross. Vernell Morgans, M.D.  [address]  [place]  [place] [address]  Fax: [phone]

## 2010-08-05 NOTE — Procedures (Signed)
[facility]  Patient:    Justin Garner, Justin Garner                    MRN: [id number] Adm. Date:  [id number] Disc. Date: [id number] Attending:  Sabino Gasser                           Procedure Report  PROCEDURE:  Upper endoscopy with biopsy.  ENDOSCOPIST:  Sabino Gasser, M.D.  INDICATION:  Reflux symptoms with heme-positive stools.  ANESTHESIA:  Demerol 40 mg and Versed 5 mg were given intravenously in divided dose.  DESCRIPTION OF PROCEDURE:  With the patient mildly sedated and in the left lateral decubitus position, the Olympus videoscopic endoscope was inserted in the mouth and passed under direct vision through the esophagus, which appeared normal except for changes of Barretts esophagus and esophagitis that were seen, photographed and biopsied.  We entered then into the stomach, fundus, body, antrum well-visualized, and there was some mild erythema of the antrum. We entered into the duodenal bulb and it was markedly inflamed with small ulcerations and erosions, photographed and multiple biopsies were taken of this area.  The second portion of duodenum appeared normal and was photographed.  From this point, the endoscope was slowly withdrawn, taking circumferential views of the entire duodenal mucosa, stopping to photograph one of the small ulcers that were seen and again biopsied.  The endoscope was then pulled back into the stomach, placed in retroflexion to view the stomach from below and this appeared normal and it was photographed.  The endoscope was then straightened and pulled back from distal-to-proximal stomach, taking circumferential views of the entire gastric and subsequently esophageal mucosa.  CLO biopsy was taken.  The endoscope was withdrawn.  Patients vital signs and pulse oximetry remained stable.  Patient tolerated the procedure well without apparent complications.  FINDINGS:  Esophagitis with Barretts-appearing tissue in the  distal esophagus, biopsied, duodenitis with an ulcer in the duodenal bulb, photographed and biopsied, and mild erythema of the antrum consistent with possible H. pylori gastritis; a CLO biopsy was taken.  Await biopsy report. PLAN:  Patient will call me for the results and follow up with me as an outpatient. DD:  08/12/99 TD:  08/16/99 Job: [id number]

## 2010-08-05 NOTE — Procedures (Signed)
[facility]  Patient:    Justin Garner, Justin Garner                    MRN: [id number] Proc. Date: 08/12/99 Adm. Date:  [id number] Disc. Date: [id number] Attending:  Sabino Gasser                           Procedure Report  PROCEDURE:  Colonoscopy.  INDICATIONS:  Hemoccult positivity.  ANESTHESIA:  Demerol 30 mg, Versed 3 mg was given intravenously in divided dose.  DESCRIPTION OF PROCEDURE:  With the patient mildly sedated in the left lateral decubitus position, a rectal exam was performed, which was unremarkable. Subsequently the Olympus videoscopic colonoscope was inserted in the rectum and advanced under direct vision to the cecum.  Cecum identified by ileocecal valve and appendiceal orifice.  The former was photographed.  From this point, the colonoscope was slowly withdrawn, taking circumferential views of the entire colonic mucosa until we pulled back to the rectum, stopping only at 50 cm from the anal verge, at which point a small flat polyp was seen, photographed, and using hot biopsy forceps technique and a setting of 20/20 blended current, it was removed.  The rectum appeared normal on direct and retroflexed views.  The endoscope was straightened and withdrawn.  The patients vital signs and pulse oximetry remained stable.  The patient tolerated the procedure well with no apparent complications.  FINDINGS:  Small polyp at approximately 50 cm from anal verge.  Await biopsy report.  The patient will call me for results and follow up with me as an outpatient. DD:  08/12/99 TD:  08/16/99 Job: [id number]

## 2010-10-06 ENCOUNTER — Encounter (HOSPITAL_COMMUNITY)
Admission: RE | Admit: 2010-10-06 | Discharge: 2010-10-06 | Disposition: A | Discharge status: Home / Self Care | Payer: Medicare Other | Source: Ambulatory Visit | Attending: Surgery | Admitting: Surgery

## 2010-10-06 LAB — CBC
Hemoglobin: 17 g/dL (ref 13.0–17.0)
MCH: 30.7 pg (ref 26.0–34.0)
MCV: 87.7 fL (ref 78.0–100.0)
RBC: 5.53 MIL/uL (ref 4.22–5.81)

## 2010-10-06 LAB — SURGICAL PCR SCREEN: Staphylococcus aureus: POSITIVE — AB

## 2010-10-06 LAB — BASIC METABOLIC PANEL
CO2: 25 mEq/L (ref 19–32)
Glucose, Bld: 186 mg/dL — ABNORMAL HIGH (ref 70–99)
Potassium: 4.4 mEq/L (ref 3.5–5.1)
Sodium: 138 mEq/L (ref 135–145)

## 2010-10-10 ENCOUNTER — Ambulatory Visit (HOSPITAL_COMMUNITY)
Admission: RE | Admit: 2010-10-10 | Discharge: 2010-10-10 | Disposition: A | Discharge status: Home / Self Care | Payer: Medicare Other | Source: Ambulatory Visit | Attending: Surgery | Admitting: Surgery

## 2010-10-10 DIAGNOSIS — K4090 Unilateral inguinal hernia, without obstruction or gangrene, not specified as recurrent: Secondary | ICD-10-CM | POA: Insufficient documentation

## 2010-10-10 DIAGNOSIS — Z7901 Long term (current) use of anticoagulants: Secondary | ICD-10-CM | POA: Insufficient documentation

## 2010-10-10 DIAGNOSIS — J449 Chronic obstructive pulmonary disease, unspecified: Secondary | ICD-10-CM | POA: Insufficient documentation

## 2010-10-10 DIAGNOSIS — Z01812 Encounter for preprocedural laboratory examination: Secondary | ICD-10-CM | POA: Insufficient documentation

## 2010-10-10 DIAGNOSIS — Z7982 Long term (current) use of aspirin: Secondary | ICD-10-CM | POA: Insufficient documentation

## 2010-10-10 DIAGNOSIS — Z85118 Personal history of other malignant neoplasm of bronchus and lung: Secondary | ICD-10-CM | POA: Insufficient documentation

## 2010-10-10 DIAGNOSIS — J4489 Other specified chronic obstructive pulmonary disease: Secondary | ICD-10-CM | POA: Insufficient documentation

## 2010-10-10 DIAGNOSIS — I2510 Atherosclerotic heart disease of native coronary artery without angina pectoris: Secondary | ICD-10-CM | POA: Insufficient documentation

## 2010-10-10 DIAGNOSIS — Z79899 Other long term (current) drug therapy: Secondary | ICD-10-CM | POA: Insufficient documentation

## 2010-10-10 DIAGNOSIS — I10 Essential (primary) hypertension: Secondary | ICD-10-CM | POA: Insufficient documentation

## 2010-10-10 DIAGNOSIS — I4891 Unspecified atrial fibrillation: Secondary | ICD-10-CM | POA: Insufficient documentation

## 2010-10-10 DIAGNOSIS — E119 Type 2 diabetes mellitus without complications: Secondary | ICD-10-CM | POA: Insufficient documentation

## 2010-10-10 HISTORY — PX: INGUINAL HERNIA REPAIR: SUR1180

## 2010-10-10 LAB — GLUCOSE, CAPILLARY
Glucose-Capillary: 155 mg/dL — ABNORMAL HIGH (ref 70–99)
Glucose-Capillary: 175 mg/dL — ABNORMAL HIGH (ref 70–99)

## 2010-10-16 NOTE — Op Note (Signed)
  NAME:  Justin Garner, Justin Garner NO.:  [id number]  MEDICAL RECORD NO.:  [id number]  LOCATION:  SDSC                         FACILITY:  [facility]  PHYSICIAN:  Abigail Miyamoto, M.D. DATE OF BIRTH:  [birth year]  DATE OF PROCEDURE: DATE OF DISCHARGE:                              OPERATIVE REPORT   PREOPERATIVE DIAGNOSIS:  Right inguinal hernia.  POSTOPERATIVE DIAGNOSIS:  Right inguinal hernia.  PROCEDURE:  Right inguinal hernia repair with mesh.  SURGEON:  Abigail Miyamoto, MD  ANESTHESIA:  General and 0.5% Marcaine and injectable __________.  ESTIMATED BLOOD LOSS:  Minimal.  FINDINGS:  The patient had an heavy large indirect right inguinal hernia.  PROCEDURE IN DETAIL:  The patient was brought to the operating room and identified as himself and he was placed supine on the operating room table and general anesthesia was induced.  I then performed an ilioinguinal nerve block, anesthetized the skin as well with 0.5% Marcaine.  I then made a longitudinal incision in the patient's right groin with a #15 blade.  I took this down through Scarpa fascia with electrocautery and sharply the fascia was then identified and opened further through the internal and external ring.  The testicular cord and structures as well as a large indirect hernia sac were identified and controlled with a Penrose drain.  I was able to easily separate the sac from the cord structures and dissected the sac down to the exit from the internal ring.  I then tied off the base of the sac with a 2-0 silk suture after reducing the contents.  The redundant sac was then excised. I brought a piece of Parietex ProGrip mesh onto the field and placed it on the right inguinal floor and brought around the cord structures appropriately.  I then sewed it with a single suture to the pubic tubercle.  Good coverage of the inguinal floor and internal ring appeared to be achieved.  I then closed the external oblique fascia  over top of this with a running 2-0 Vicryl suture.  I anesthetized the wound further with __________ and performed another nerve block with __________ as well hemostasis appeared to be achieved.  I then closed the Scarpa fascia with interrupted 3-0 Vicryl suture and closed the skin with running 4-0 Monocryl.  Steri-Strips, Telfa and Tegaderm were then applied.  The patient tolerated the procedure well.  All sponge, needle and instrument counts were correct at the end of the procedure.  The patient was then extubated in the operating room and taken in a stable condition to recovery room.     Abigail Miyamoto, M.D.     DB/MEDQ  D:  10/10/2010  T:  10/10/2010  Job:  [id number]  Electronically Signed by Abigail Miyamoto M.D. on 10/16/2010 02:04:35 PM

## 2010-10-20 ENCOUNTER — Encounter (INDEPENDENT_AMBULATORY_CARE_PROVIDER_SITE_OTHER): Payer: Self-pay | Admitting: Surgery

## 2010-10-24 ENCOUNTER — Ambulatory Visit (INDEPENDENT_AMBULATORY_CARE_PROVIDER_SITE_OTHER): Payer: Medicare Other | Admitting: Surgery

## 2010-10-24 ENCOUNTER — Encounter (INDEPENDENT_AMBULATORY_CARE_PROVIDER_SITE_OTHER): Payer: Self-pay | Admitting: Surgery

## 2010-10-24 DIAGNOSIS — Z09 Encounter for follow-up examination after completed treatment for conditions other than malignant neoplasm: Secondary | ICD-10-CM

## 2010-10-24 NOTE — Progress Notes (Signed)
Subjective:     Patient ID: Justin Garner, male   DOB: 05/10/1932, 77 y.o.   MRN: [id number]  HPI  He is here for his first postoperative visit status post right inguinal hernia repair with mesh. Other than mild discomfort, he is doing well. Review of Systems     Objective:   Physical Exam On exam, he has mild postoperative swelling. There is no evidence of recurrence.    Assessment:     Patient status post right inguinal hernia  Mesh.    Plan:     He will refrain from heavy lifting for 2 more weeks. He may then return to normal activity. I will see him back as needed.

## 2010-10-25 ENCOUNTER — Encounter: Payer: Self-pay | Admitting: Pulmonary Disease

## 2010-10-25 ENCOUNTER — Ambulatory Visit (INDEPENDENT_AMBULATORY_CARE_PROVIDER_SITE_OTHER): Payer: Medicare Other | Admitting: Pulmonary Disease

## 2010-10-25 VITALS — BP 122/82 | HR 88 | Temp 97.5°F | Ht 74.0 in | Wt 231.2 lb

## 2010-10-25 DIAGNOSIS — J438 Other emphysema: Secondary | ICD-10-CM

## 2010-10-25 NOTE — Assessment & Plan Note (Signed)
The pt has moderate copd, and is near his usual baseline wrt symptoms.  He currently is using symbicort prn, but sees a difference when he uses twice a day.  He is having trouble affording the medication.  I have asked him to stay on the inhaler twice a day as much as possible, and we will refer him to the pt assistance program.  I also recommended staying as active as possible to improve endurance.

## 2010-10-25 NOTE — Patient Instructions (Signed)
Will keep on symbicort 2 puffs am and pm, and keep mouth rinsed.  Try to use everyday Will see if you qualify for the patient assistance program for symbicort to help you with the expense Try to stay as active as possible followup with me in 6mos.

## 2010-10-25 NOTE — Progress Notes (Signed)
  Subjective:    Patient ID: Justin Garner, male    DOB: April 15, 1932, 77 y.o.   MRN: [id number]  HPI The comes in today for f/u of his known moderate copd.  He sees a definite benefit when he uses symbicort more regularly, but is having issues with the expense.  He denies any recent acute flare or pulmonary infection.  He feels his doe is at baseline.  Denies cough, congestion, or mucus   Review of Systems  Constitutional: Negative.  Negative for fever and unexpected weight change.  HENT: Positive for sneezing. Negative for ear pain, nosebleeds, congestion, sore throat, rhinorrhea, trouble swallowing, dental problem, postnasal drip and sinus pressure.   Eyes: Negative.  Negative for redness and itching.  Respiratory: Positive for cough and shortness of breath. Negative for chest tightness and wheezing.   Cardiovascular: Negative.  Negative for palpitations and leg swelling.  Gastrointestinal: Negative.  Negative for nausea and vomiting.  Genitourinary: Negative.  Negative for dysuria.  Musculoskeletal: Negative.  Negative for joint swelling.  Skin: Negative.  Negative for rash.  Neurological: Negative.  Negative for headaches.  Hematological: Negative.  Does not bruise/bleed easily.  Psychiatric/Behavioral: Negative.  Negative for dysphoric mood. The patient is not nervous/anxious.        Objective:   Physical Exam Ow male in nad Nares without discharge or purulence Chest with decreased bs, no wheezing Cor with rrr LE with mild ankle edema, no cyanosis Alert, oriented, moves all 4        Assessment & Plan:

## 2010-10-29 ENCOUNTER — Encounter: Payer: Self-pay | Admitting: Pulmonary Disease

## 2010-12-09 LAB — CBC
HCT: 43.8
MCV: 90.8
Platelets: 236
RDW: 14.2

## 2010-12-09 LAB — APTT: aPTT: 28

## 2010-12-09 LAB — PROTIME-INR: INR: 1.2

## 2010-12-12 LAB — COMPREHENSIVE METABOLIC PANEL
ALT: 33
AST: 22
Albumin: 4.1
Alkaline Phosphatase: 73
Potassium: 4.1
Sodium: 139
Total Protein: 6.4

## 2010-12-12 LAB — TYPE AND SCREEN: Antibody Screen: NEGATIVE

## 2010-12-12 LAB — PROTIME-INR
INR: 1.2
Prothrombin Time: 15.6 — ABNORMAL HIGH

## 2010-12-12 LAB — CULTURE, RESPIRATORY W GRAM STAIN: Culture: NO GROWTH

## 2010-12-12 LAB — CBC
Hemoglobin: 14.2
Platelets: 250
RDW: 13.4

## 2010-12-13 LAB — COMPREHENSIVE METABOLIC PANEL
CO2: 22
Calcium: 8.3 — ABNORMAL LOW
Creatinine, Ser: 1.12
GFR calc Af Amer: >60
GFR calc non Af Amer: >60
Glucose, Bld: 125 — ABNORMAL HIGH
Total Protein: 5.4 — ABNORMAL LOW

## 2010-12-13 LAB — HEPARIN LEVEL (UNFRACTIONATED)
Heparin Unfractionated: 0.31
Heparin Unfractionated: 0.35
Heparin Unfractionated: 0.4

## 2010-12-13 LAB — CBC
HCT: 34.9 — ABNORMAL LOW
Hemoglobin: 11.6 — ABNORMAL LOW
Hemoglobin: 12.1 — ABNORMAL LOW
MCV: 92.4
Platelets: 224
RBC: 3.65 — ABNORMAL LOW
RBC: 3.66 — ABNORMAL LOW
RBC: 3.77 — ABNORMAL LOW
RDW: 15.3
WBC: 7.3
WBC: 7.6

## 2010-12-13 LAB — DIFFERENTIAL
Lymphocytes Relative: 17
Lymphs Abs: 1.3
Neutro Abs: 4.7
Neutrophils Relative %: 62

## 2010-12-13 LAB — BASIC METABOLIC PANEL
CO2: 27
Chloride: 107
Creatinine, Ser: 1.28
GFR calc Af Amer: >60
GFR calc non Af Amer: >60
Glucose, Bld: 95
Potassium: 3.3 — ABNORMAL LOW
Potassium: 3.4 — ABNORMAL LOW
Sodium: 139
Sodium: 139

## 2010-12-13 LAB — PROTIME-INR: INR: 1.4

## 2010-12-14 LAB — POCT I-STAT 7, (LYTES, BLD GAS, ICA,H+H)
Bicarbonate: 24.5 — ABNORMAL HIGH
HCT: 32 — ABNORMAL LOW
HCT: 33 — ABNORMAL LOW
Hemoglobin: 11.2 — ABNORMAL LOW
O2 Saturation: 88
Operator id: 219291
Potassium: 4.7
TCO2: 25
pCO2 arterial: 34.3 — ABNORMAL LOW
pH, Arterial: 7.421
pO2, Arterial: 51 — ABNORMAL LOW

## 2010-12-14 LAB — BASIC METABOLIC PANEL
BUN: 10
BUN: 8
BUN: 9
BUN: 9
CO2: 25
CO2: 26
CO2: 27
Calcium: 9
Calcium: 7.3 — ABNORMAL LOW
Calcium: 7.9 — ABNORMAL LOW
Calcium: 7.9 — ABNORMAL LOW
Calcium: 8.2 — ABNORMAL LOW
Chloride: 103
Chloride: 105
Chloride: 106
Creatinine, Ser: 1
Creatinine, Ser: 1.01
Creatinine, Ser: 1.02
Creatinine, Ser: 1.09
Creatinine, Ser: 1.16
GFR calc Af Amer: >60
GFR calc Af Amer: >60
GFR calc Af Amer: >60
GFR calc Af Amer: >60
GFR calc non Af Amer: 58 — ABNORMAL LOW
GFR calc non Af Amer: >60
GFR calc non Af Amer: >60
GFR calc non Af Amer: >60
Glucose, Bld: 155 — ABNORMAL HIGH
Potassium: 3.9
Potassium: 4.4
Sodium: 143
Sodium: 138

## 2010-12-14 LAB — POCT I-STAT 3, ART BLOOD GAS (G3+)
Operator id: 297551
pCO2 arterial: 31.1 — ABNORMAL LOW
pH, Arterial: 7.436
pO2, Arterial: 64 — ABNORMAL LOW

## 2010-12-14 LAB — PROTIME-INR
INR: 1.1
INR: 1.1
INR: 1.2
Prothrombin Time: 13.5
Prothrombin Time: 14.4
Prothrombin Time: 15.8 — ABNORMAL HIGH

## 2010-12-14 LAB — COMPREHENSIVE METABOLIC PANEL
ALT: 30
AST: 31
AST: 41 — ABNORMAL HIGH
Albumin: 2.3 — ABNORMAL LOW
Alkaline Phosphatase: 63
BUN: 7
CO2: 26
Calcium: 7.6 — ABNORMAL LOW
Creatinine, Ser: 1.18
GFR calc Af Amer: >60
GFR calc Af Amer: >60
GFR calc non Af Amer: 59 — ABNORMAL LOW
Glucose, Bld: 114 — ABNORMAL HIGH
Potassium: 3.4 — ABNORMAL LOW
Potassium: 4
Sodium: 137
Total Protein: 4.1 — ABNORMAL LOW
Total Protein: 5.1 — ABNORMAL LOW

## 2010-12-14 LAB — TROPONIN I: Troponin I: 0.04

## 2010-12-14 LAB — TSH: TSH: 5.913 — ABNORMAL HIGH

## 2010-12-14 LAB — CBC
HCT: 30.5 — ABNORMAL LOW
HCT: 32.5 — ABNORMAL LOW
Hemoglobin: 9.2 — ABNORMAL LOW
Hemoglobin: 9.5 — ABNORMAL LOW
MCHC: 33.7
MCHC: 34.7
MCV: 93.9
MCV: 92.8
MCV: 93
MCV: 93.3
MCV: 93.7
Platelets: 295
Platelets: 179
Platelets: 204
Platelets: 241
Platelets: 352
Platelets: 467 — ABNORMAL HIGH
RBC: 2.81 — ABNORMAL LOW
RBC: 2.91 — ABNORMAL LOW
RBC: 2.93 — ABNORMAL LOW
RBC: 3.04 — ABNORMAL LOW
RDW: 16.6 — ABNORMAL HIGH
RDW: 16.6 — ABNORMAL HIGH
RDW: 16.8 — ABNORMAL HIGH
WBC: 8.2
WBC: 10.7 — ABNORMAL HIGH
WBC: 14.3 — ABNORMAL HIGH
WBC: 7.7
WBC: 7.7

## 2010-12-14 LAB — CROSSMATCH

## 2010-12-14 LAB — CK TOTAL AND CKMB (NOT AT ARMC)
CK, MB: 1.8
Relative Index: 0.8
Total CK: 218

## 2010-12-14 LAB — URINALYSIS, ROUTINE W REFLEX MICROSCOPIC
Bilirubin Urine: NEGATIVE
Glucose, UA: NEGATIVE
Ketones, ur: NEGATIVE
pH: 7

## 2011-01-03 ENCOUNTER — Other Ambulatory Visit: Payer: Self-pay | Admitting: Thoracic Surgery

## 2011-01-03 DIAGNOSIS — C3410 Malignant neoplasm of upper lobe, unspecified bronchus or lung: Secondary | ICD-10-CM

## 2011-01-27 ENCOUNTER — Encounter: Payer: Self-pay | Admitting: Thoracic Surgery

## 2011-02-01 ENCOUNTER — Ambulatory Visit: Payer: Medicare Other | Admitting: Thoracic Surgery

## 2011-02-01 ENCOUNTER — Other Ambulatory Visit: Payer: Medicare Other

## 2011-02-15 ENCOUNTER — Encounter: Payer: Self-pay | Admitting: Thoracic Surgery

## 2011-02-15 ENCOUNTER — Ambulatory Visit (INDEPENDENT_AMBULATORY_CARE_PROVIDER_SITE_OTHER): Payer: Medicare Other | Admitting: Thoracic Surgery

## 2011-02-15 ENCOUNTER — Ambulatory Visit
Admission: RE | Admit: 2011-02-15 | Discharge: 2011-02-15 | Disposition: A | Discharge status: Home / Self Care | Payer: Medicare Other | Source: Ambulatory Visit | Attending: Thoracic Surgery | Admitting: Thoracic Surgery

## 2011-02-15 VITALS — BP 125/81 | HR 74 | Resp 20 | Ht 74.0 in | Wt 221.0 lb

## 2011-02-15 DIAGNOSIS — Z85118 Personal history of other malignant neoplasm of bronchus and lung: Secondary | ICD-10-CM

## 2011-02-15 DIAGNOSIS — C3410 Malignant neoplasm of upper lobe, unspecified bronchus or lung: Secondary | ICD-10-CM

## 2011-02-15 NOTE — Progress Notes (Signed)
HPI Justin Garner came for followup today. 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 There is no evidence of recurrence on his CT scan. We will see him again in 6 months for final followup and get a CT scan at that time he has occasional pain from his surgery but otherwise is doing well.   Current Outpatient Prescriptions  Medication Sig Dispense Refill  . aspirin 81 MG tablet Take 81 mg by mouth daily.        Marland Kitchen atorvastatin (LIPITOR) 20 MG tablet Take 20 mg by mouth daily.        . budesonide-formoterol (SYMBICORT) 160-4.5 MCG/ACT inhaler Inhale 2 puffs into the lungs 2 (two) times daily.        Marland Kitchen diltiazem (CARDIZEM CD) 240 MG 24 hr capsule Take 240 mg by mouth daily.        . fenofibrate micronized (LOFIBRA) 67 MG capsule Take 67 mg by mouth daily before breakfast.        . fish oil-omega-3 fatty acids 1000 MG capsule Take 2 g by mouth daily.        Marland Kitchen glipiZIDE (GLUCOTROL) 10 MG tablet Take 10 mg by mouth 2 (two) times daily before a meal.        . levothyroxine (SYNTHROID, LEVOTHROID) 112 MCG tablet Take 112 mcg by mouth daily.        Marland Kitchen omeprazole (PRILOSEC) 20 MG capsule Take 20 mg by mouth daily.        Marland Kitchen warfarin (COUMADIN) 5 MG tablet Take 5 mg by mouth daily.           Review of Systems: Unchanged  Physical Exam  Constitutional: He appears well-developed and well-nourished.  Cardiovascular: Normal rate, regular rhythm and normal heart sounds.   Pulmonary/Chest: Effort normal and breath sounds normal.     Diagnostic Tests: t CT scan showed no evidence of recurrence of his cancer   Impression: Status post resection of right superior sulcus tumor   Plan: Return in 6 months with CT scan

## 2011-02-21 ENCOUNTER — Encounter (HOSPITAL_COMMUNITY): Payer: Self-pay

## 2011-02-21 ENCOUNTER — Other Ambulatory Visit: Payer: Self-pay

## 2011-02-21 ENCOUNTER — Inpatient Hospital Stay (HOSPITAL_COMMUNITY)
Admission: EM | Admit: 2011-02-21 | Discharge: 2011-02-25 | DRG: 201 | Disposition: A | Discharge status: Home / Self Care | Payer: Medicare Other | Attending: Thoracic Surgery | Admitting: Thoracic Surgery

## 2011-02-21 ENCOUNTER — Emergency Department (HOSPITAL_COMMUNITY): Payer: Medicare Other

## 2011-02-21 DIAGNOSIS — J939 Pneumothorax, unspecified: Secondary | ICD-10-CM

## 2011-02-21 DIAGNOSIS — Z85118 Personal history of other malignant neoplasm of bronchus and lung: Secondary | ICD-10-CM

## 2011-02-21 DIAGNOSIS — J930 Spontaneous tension pneumothorax: Secondary | ICD-10-CM

## 2011-02-21 DIAGNOSIS — E119 Type 2 diabetes mellitus without complications: Secondary | ICD-10-CM | POA: Diagnosis present

## 2011-02-21 DIAGNOSIS — J449 Chronic obstructive pulmonary disease, unspecified: Secondary | ICD-10-CM | POA: Diagnosis present

## 2011-02-21 DIAGNOSIS — E785 Hyperlipidemia, unspecified: Secondary | ICD-10-CM | POA: Diagnosis present

## 2011-02-21 DIAGNOSIS — Z7982 Long term (current) use of aspirin: Secondary | ICD-10-CM

## 2011-02-21 DIAGNOSIS — I2510 Atherosclerotic heart disease of native coronary artery without angina pectoris: Secondary | ICD-10-CM | POA: Diagnosis present

## 2011-02-21 DIAGNOSIS — Z86711 Personal history of pulmonary embolism: Secondary | ICD-10-CM

## 2011-02-21 DIAGNOSIS — K219 Gastro-esophageal reflux disease without esophagitis: Secondary | ICD-10-CM | POA: Diagnosis present

## 2011-02-21 DIAGNOSIS — I10 Essential (primary) hypertension: Secondary | ICD-10-CM | POA: Diagnosis present

## 2011-02-21 DIAGNOSIS — I4891 Unspecified atrial fibrillation: Secondary | ICD-10-CM | POA: Diagnosis present

## 2011-02-21 DIAGNOSIS — R059 Cough, unspecified: Secondary | ICD-10-CM | POA: Diagnosis present

## 2011-02-21 DIAGNOSIS — J9383 Other pneumothorax: Principal | ICD-10-CM | POA: Diagnosis present

## 2011-02-21 DIAGNOSIS — Z7901 Long term (current) use of anticoagulants: Secondary | ICD-10-CM

## 2011-02-21 DIAGNOSIS — J4489 Other specified chronic obstructive pulmonary disease: Secondary | ICD-10-CM | POA: Diagnosis present

## 2011-02-21 DIAGNOSIS — R05 Cough: Secondary | ICD-10-CM | POA: Diagnosis present

## 2011-02-21 LAB — CBC
MCH: 30.8 pg (ref 26.0–34.0)
MCV: 88.1 fL (ref 78.0–100.0)
Platelets: 259 10*3/uL (ref 150–400)
RBC: 5.61 MIL/uL (ref 4.22–5.81)
RDW: 15.3 % (ref 11.5–15.5)

## 2011-02-21 LAB — BASIC METABOLIC PANEL
Calcium: 9.5 mg/dL (ref 8.4–10.5)
GFR calc Af Amer: 57 mL/min — ABNORMAL LOW (ref >90)
GFR calc non Af Amer: 49 mL/min — ABNORMAL LOW (ref >90)
Potassium: 3.9 mEq/L (ref 3.5–5.1)
Sodium: 137 mEq/L (ref 135–145)

## 2011-02-21 LAB — DIFFERENTIAL
Basophils Absolute: 0.1 10*3/uL (ref 0.0–0.1)
Basophils Relative: 0 % (ref 0–1)
Eosinophils Absolute: 0.2 10*3/uL (ref 0.0–0.7)
Eosinophils Relative: 1 % (ref 0–5)
Neutrophils Relative %: 67 % (ref 43–77)

## 2011-02-21 LAB — PROTIME-INR
INR: 1.99 — ABNORMAL HIGH (ref 0.00–1.49)
Prothrombin Time: 22.9 seconds — ABNORMAL HIGH (ref 11.6–15.2)

## 2011-02-21 MED ORDER — OMEGA-3 FATTY ACIDS 1000 MG PO CAPS: 2.0000 g | ORAL_CAPSULE | Freq: Every day | ORAL | Status: DC

## 2011-02-21 MED ORDER — ASPIRIN 81 MG PO CHEW
81.0000 mg | CHEWABLE_TABLET | Freq: Every day | ORAL | Status: DC
  Administered 2011-02-22 – 2011-02-24 (×3): 81 mg via ORAL
  Filled 2011-02-21 (×4): qty 1

## 2011-02-21 MED ORDER — TRAMADOL HCL 50 MG PO TABS
100.0000 mg | ORAL_TABLET | Freq: Four times a day (QID) | ORAL | Status: DC | PRN
  Administered 2011-02-22: 100 mg via ORAL
  Filled 2011-02-21: qty 2

## 2011-02-21 MED ORDER — OXYCODONE HCL 5 MG PO TABS
10.0000 mg | ORAL_TABLET | Freq: Four times a day (QID) | ORAL | Status: DC | PRN
  Administered 2011-02-22 – 2011-02-23 (×2): 10 mg via ORAL
  Filled 2011-02-21: qty 1
  Filled 2011-02-21 (×2): qty 2
  Filled 2011-02-21: qty 1

## 2011-02-21 MED ORDER — ONDANSETRON HCL 4 MG/2ML IJ SOLN
INTRAMUSCULAR | Status: AC
  Filled 2011-02-21: qty 2

## 2011-02-21 MED ORDER — GLIPIZIDE 10 MG PO TABS
10.0000 mg | ORAL_TABLET | Freq: Two times a day (BID) | ORAL | Status: DC
  Administered 2011-02-22 – 2011-02-24 (×6): 10 mg via ORAL
  Filled 2011-02-21 (×10): qty 1

## 2011-02-21 MED ORDER — THERA M PLUS PO TABS
1.0000 | ORAL_TABLET | Freq: Every day | ORAL | Status: DC
  Administered 2011-02-22 – 2011-02-24 (×3): 1 via ORAL
  Filled 2011-02-21 (×4): qty 1

## 2011-02-21 MED ORDER — FENTANYL CITRATE 0.05 MG/ML IJ SOLN
INTRAMUSCULAR | Status: AC
  Administered 2011-02-21: 18:00:00 via INTRAVENOUS
  Filled 2011-02-21: qty 6

## 2011-02-21 MED ORDER — FENTANYL CITRATE 0.05 MG/ML IJ SOLN: 50.0000 ug | INTRAMUSCULAR | Status: DC | PRN

## 2011-02-21 MED ORDER — ACETAMINOPHEN 650 MG RE SUPP: 650.0000 mg | Freq: Four times a day (QID) | RECTAL | Status: DC | PRN

## 2011-02-21 MED ORDER — SODIUM CHLORIDE 0.9 % IV SOLN: 250.0000 mL | INTRAVENOUS | Status: DC | PRN

## 2011-02-21 MED ORDER — MIDAZOLAM HCL 2 MG/2ML IJ SOLN
4.0000 mg | Freq: Once | INTRAMUSCULAR | Status: AC
  Administered 2011-02-21: 2 mg via INTRAVENOUS

## 2011-02-21 MED ORDER — OXYCODONE HCL 5 MG PO TABS
5.0000 mg | ORAL_TABLET | ORAL | Status: DC | PRN
  Administered 2011-02-21 – 2011-02-24 (×4): 5 mg via ORAL
  Filled 2011-02-21: qty 1

## 2011-02-21 MED ORDER — BUDESONIDE-FORMOTEROL FUMARATE 160-4.5 MCG/ACT IN AERO
2.0000 | INHALATION_SPRAY | Freq: Two times a day (BID) | RESPIRATORY_TRACT | Status: DC
  Administered 2011-02-22 – 2011-02-24 (×6): 2 via RESPIRATORY_TRACT
  Filled 2011-02-21: qty 6

## 2011-02-21 MED ORDER — OXYCODONE HCL 5 MG PO TABS
ORAL_TABLET | ORAL | Status: AC
  Filled 2011-02-21: qty 1

## 2011-02-21 MED ORDER — LEVOTHYROXINE SODIUM 112 MCG PO TABS
112.0000 ug | ORAL_TABLET | Freq: Every day | ORAL | Status: DC
  Administered 2011-02-22 – 2011-02-24 (×3): 112 ug via ORAL
  Filled 2011-02-21 (×4): qty 1

## 2011-02-21 MED ORDER — MIDAZOLAM HCL 2 MG/2ML IJ SOLN
INTRAMUSCULAR | Status: AC | PRN
  Administered 2011-02-21: 2 mg via INTRAVENOUS

## 2011-02-21 MED ORDER — ALUM & MAG HYDROXIDE-SIMETH 200-200-20 MG/5ML PO SUSP: 30.0000 mL | Freq: Four times a day (QID) | ORAL | Status: DC | PRN

## 2011-02-21 MED ORDER — MORPHINE SULFATE 4 MG/ML IJ SOLN
INTRAMUSCULAR | Status: AC
  Filled 2011-02-21: qty 2

## 2011-02-21 MED ORDER — SODIUM CHLORIDE 0.9 % IJ SOLN
3.0000 mL | Freq: Two times a day (BID) | INTRAMUSCULAR | Status: DC
  Administered 2011-02-22 – 2011-02-24 (×7): 3 mL via INTRAVENOUS

## 2011-02-21 MED ORDER — MORPHINE SULFATE 2 MG/ML IJ SOLN
INTRAMUSCULAR | Status: AC
  Filled 2011-02-21: qty 1

## 2011-02-21 MED ORDER — DOCUSATE SODIUM 100 MG PO CAPS
100.0000 mg | ORAL_CAPSULE | Freq: Two times a day (BID) | ORAL | Status: DC
  Administered 2011-02-21 – 2011-02-24 (×7): 100 mg via ORAL
  Filled 2011-02-21 (×7): qty 1

## 2011-02-21 MED ORDER — MIDAZOLAM HCL 2 MG/2ML IJ SOLN
INTRAMUSCULAR | Status: AC
  Administered 2011-02-21: 2 mg via INTRAVENOUS
  Filled 2011-02-21: qty 4

## 2011-02-21 MED ORDER — ACETAMINOPHEN 325 MG PO TABS: 650.0000 mg | ORAL_TABLET | Freq: Four times a day (QID) | ORAL | Status: DC | PRN

## 2011-02-21 MED ORDER — ONDANSETRON HCL 4 MG/2ML IJ SOLN
4.0000 mg | Freq: Four times a day (QID) | INTRAMUSCULAR | Status: DC | PRN
  Administered 2011-02-21: 4 mg via INTRAVENOUS

## 2011-02-21 MED ORDER — OMEGA-3-ACID ETHYL ESTERS 1 G PO CAPS
2.0000 g | ORAL_CAPSULE | Freq: Every day | ORAL | Status: DC
  Administered 2011-02-22 – 2011-02-24 (×3): 2 g via ORAL
  Filled 2011-02-21 (×4): qty 2

## 2011-02-21 MED ORDER — SIMVASTATIN 20 MG PO TABS
20.0000 mg | ORAL_TABLET | Freq: Every day | ORAL | Status: DC
  Administered 2011-02-22 – 2011-02-24 (×3): 20 mg via ORAL
  Filled 2011-02-21 (×4): qty 1

## 2011-02-21 MED ORDER — FENOFIBRATE 54 MG PO TABS
54.0000 mg | ORAL_TABLET | Freq: Every day | ORAL | Status: DC
  Administered 2011-02-22 – 2011-02-24 (×3): 54 mg via ORAL
  Filled 2011-02-21 (×4): qty 1

## 2011-02-21 MED ORDER — SODIUM CHLORIDE 0.9 % IJ SOLN: 3.0000 mL | INTRAMUSCULAR | Status: DC | PRN

## 2011-02-21 MED ORDER — MORPHINE SULFATE 2 MG/ML IJ SOLN
2.0000 mg | INTRAMUSCULAR | Status: DC | PRN
  Administered 2011-02-21 (×2): 2 mg via INTRAVENOUS
  Filled 2011-02-21: qty 1

## 2011-02-21 MED ORDER — ONDANSETRON HCL 4 MG PO TABS: 4.0000 mg | ORAL_TABLET | Freq: Four times a day (QID) | ORAL | Status: DC | PRN

## 2011-02-21 MED ORDER — ALBUTEROL SULFATE (5 MG/ML) 0.5% IN NEBU: 2.5000 mg | INHALATION_SOLUTION | RESPIRATORY_TRACT | Status: DC | PRN

## 2011-02-21 MED ORDER — PANTOPRAZOLE SODIUM 40 MG PO TBEC
40.0000 mg | DELAYED_RELEASE_TABLET | Freq: Every day | ORAL | Status: DC
  Administered 2011-02-22 – 2011-02-24 (×3): 40 mg via ORAL
  Filled 2011-02-21 (×3): qty 1

## 2011-02-21 MED ORDER — MIDAZOLAM HCL 2 MG/2ML IJ SOLN
INTRAMUSCULAR | Status: AC
  Filled 2011-02-21: qty 4

## 2011-02-21 MED ORDER — DILTIAZEM HCL ER COATED BEADS 240 MG PO CP24
240.0000 mg | ORAL_CAPSULE | Freq: Every day | ORAL | Status: DC
  Administered 2011-02-22 – 2011-02-24 (×3): 240 mg via ORAL
  Filled 2011-02-21 (×4): qty 1

## 2011-02-21 NOTE — ED Notes (Signed)
Patient is resting comfortably; family at bedside; no signs of distress.

## 2011-02-21 NOTE — ED Notes (Signed)
Calling report now. 

## 2011-02-21 NOTE — ED Notes (Signed)
Attempted to call report again; was told that the nurse has my number and will call me in 5 minutes.

## 2011-02-21 NOTE — ED Notes (Signed)
Report given to 3300; preparing patient for transport.

## 2011-02-21 NOTE — ED Notes (Signed)
Attempted to call report to [phone]; was told that the nurse is in a room and would call me back in 5-10 minutes.

## 2011-02-21 NOTE — Progress Notes (Signed)
ANTICOAGULATION CONSULT NOTE - Initial Consult  Pharmacy Consult for Coumadin Indication: atrial fibrillation and history of pulmonary embolus  No Known Allergies  Patient Measurements: (from 02/15/11) Height = 6'2" Weight = 221 lb (100.245 kg)    Vital Signs: Temp: 98.8 F (37.1 C) (12/04 1639) Temp src: Oral (12/04 1639) BP: 113/78 mmHg (12/04 1815) Pulse Rate: 84  (12/04 1815)  Labs:  Basename 02/21/11 1651  HGB 17.3*  HCT 49.4  PLT 259  APTT --  LABPROT 22.9*  INR 1.99*  HEPARINUNFRC --  CREATININE 1.36*  CKTOTAL --  CKMB --  TROPONINI --   The CrCl is unknown because both a height and weight (above a minimum accepted value) are required for this calculation.  Medical History: Past Medical History  Diagnosis Date  . Diabetes mellitus   . Thyroid disease   . Coronary artery disease   . COPD (chronic obstructive pulmonary disease)   . GERD (gastroesophageal reflux disease)   . Pulmonary embolism   . Hyperlipidemia   . FH: colonic polyps   . PAF (paroxysmal atrial fibrillation)   . Hypertension   . ASCVD (arteriosclerotic cardiovascular disease)   . Cancer   History of Lung cancer s/p chemo/rad and removal of tumor 4 years ago.   Medications:  Home Coumadin dose was 5mg  on Sun, Tues, Thurs, and 7.5mg  all other days. Last dose was today 02/21/11.  Assessment: 77 year old male in ED with SOB found to have 30% L Pneumothorax and chest tube placement at 18:15pm to continue Coumadin per pharmacy per [facility] ([place]) orders. INR today is 1.99. Patient has taken his dose of Coumadin today.   Goal of Therapy:  INR 2-3   Plan:  1. No extra Coumadin tonight. 2. Follow-up PT/INR in AM (daily order in place).  3. Follow-up plan of care-- will review education with patient once admitted to floor.   Fayne Norrie 02/21/2011,6:35 PM

## 2011-02-21 NOTE — ED Notes (Signed)
Patient currently resting quietly in bed; no respiratory or acute distress noted.  Updated patient on plan of care; informed patient that a bed is available and report is being called.  Patient has no other questions or concerns at this time.  Will continue to monitor.

## 2011-02-21 NOTE — ED Notes (Signed)
Pt states that for the past few weeks he has been feeling more and more short of breath and weak. He went to his pcp cardiologist office with dr. Eldridge Dace. They completed routine lab work and obtained a chest x-ray which showed a left sided pneumothorax. Pt arrived pov and brought directly back to room.

## 2011-02-21 NOTE — ED Notes (Signed)
Patient states that for the past few weeks he has been having increasing shortness of breath and weakness. He had pcp office visit with dr. Everette Rank. They completed lab work in the office and a chest x-ray. When read there was a left sided spontaneous pneumothorax. Pt states that he feels short of breath at rest. Absent breath sounds on the left lower side and on the right side pt has some minimal expiratory wheezing. Pt states that he is unable to take in a deep breath because it makes him cough a lot. When coughing pt denies any discolored sputum. Family at the bedside. Iv started, labs obtained, pt placed on the monitor and chest tube setup at the bedside. Consent signed for procedure. cvts pa at the bedside to speak with him, wife remains at the bedside. No tracheal deviation noted.

## 2011-02-21 NOTE — H&P (Signed)
Justin Garner is an 77 y.o. male.  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 . A chest xray was done which revealed a 30% L pneumothorax. He was sent to the ER for chest tube placement , further evaluation and treatment. About 4 days ago he developed a cough and some minor cold symptoms, but overall has been feeling well.  Past Medical History  Diagnosis Date  . Diabetes mellitus   . Thyroid disease   . Coronary artery disease   . COPD (chronic obstructive pulmonary disease)   . GERD (gastroesophageal reflux disease)   . Pulmonary embolism   . Hyperlipidemia   . FH: colonic polyps   . PAF (paroxysmal atrial fibrillation)   . Hypertension   . ASCVD (arteriosclerotic cardiovascular disease)   . Cancer   S/p chemo and radiation after lung cancer surgery  Past Surgical History  Procedure Date  . Cholecystectomy   . Hernia repair   . Carotid stent   . Stent proximal   . Patent stents   . Inguinal hernia repair 10/10/2010  . Fiberoptic bronchoscopy and meiastinoscopy 06/07/2007  . R vats,thoracotomy and upper lobectomy 08/02/2007  Coronary artery stents x 2  Family History  Problem Relation Age of Onset  . Heart disease Father    Social History:  reports that he quit smoking about 9 years ago. He has quit using smokeless tobacco. He reports that he does not drink alcohol. His drug history not on file.  Allergies: No Known Allergies  Medications Prior to Admission  Medication Dose Route Frequency Provider Last Rate Last Dose  . midazolam (VERSED) 2 MG/2ML injection           . morphine 4 MG/ML injection            Medications Prior to Admission  Medication Sig Dispense Refill  . aspirin 81 MG tablet Take 81 mg by mouth daily.        Marland Kitchen atorvastatin (LIPITOR) 20  MG tablet Take 20 mg by mouth daily.        Marland Kitchen diltiazem (CARDIZEM CD) 240 MG 24 hr capsule Take 240 mg by mouth daily.        . fenofibrate micronized (LOFIBRA) 67 MG capsule Take 67 mg by mouth daily before breakfast.        . fish oil-omega-3 fatty acids 1000 MG capsule Take 2 g by mouth daily.        Marland Kitchen glipiZIDE (GLUCOTROL) 10 MG tablet Take 10 mg by mouth 2 (two) times daily before a meal.        . levothyroxine (SYNTHROID, LEVOTHROID) 112 MCG tablet Take 112 mcg by mouth daily.        Marland Kitchen omeprazole (PRILOSEC) 20 MG capsule Take 20 mg by mouth daily.        Marland Kitchen warfarin (COUMADIN) 5 MG tablet Take 5-7.5 mg by mouth daily. [name], and Thurs-1 tab (5mg ), All other days-1.5 tabs (7.5mg )        No results found for this or any previous visit (from the past 48 hour(s)). No results found. Review of Systems - General ROS: feels tired ENT ROS: cough , runny nose, some mild sinus congestion Allergy and Immunology ROS: negative Hematological and Lymphatic ROS: on coumadin for afib Endocrine ROS:  diabetes Respiratory ROS: as above Cardiovascular ROS: mild dyspnea with exertion Gastrointestinal ROS: mild constipation Genito-Urinary ROS: no dysuria, trouble voiding, or hematuria Musculoskeletal ROS: mild joint discomforts Neurological ROS: no TIA or stroke symptoms Pulse 98  Temp(Src) 98.8 F (37.1 C) (Oral)  SpO2 95%  General Appearance:    Alert, cooperative, no distress, appears stated age  Head:    Normocephalic, without obvious abnormality, atraumatic  Eyes:    PERRL, conjunctiva/corneas clear      Ears:    Marked cerumen obscuring canals  Nose:   Nares hyperemic septum midline, mucosa normal, no drainage   or sinus tenderness  Throat:   Lips, mucosa, and tongue normal; full dentures  Neck:   Supple, symmetrical, trachea midline, no adenopathy;       thyroid:  No enlargement/tenderness/nodules; no carotid   bruit or JVD  Back:     Symmetric, no curvature, ROM normal, no CVA  tenderness  Lungs:     Coarse breath sounds, no wheeze  Chest wall:    No tenderness, previous scar well healed right thoracotomy  Heart:    Irreg rate and rhythm, S1 and S2 normal, no murmur, rub   or gallop  Abdomen:     Soft, non-tender, bowel sounds active all four quadrants,    no masses, no organomegaly  Genitalia:  defer  Rectal:  defer  Extremities:   Extremities normal, atraumatic, no cyanosis or edema  Pulses:   2+ and symmetric all extremities  Skin:   Skin color, texture, turgor normal, no rashes or lesions  Lymph nodes:   Cervical, supraclavicular, and axillary nodes normal  Neurologic: Grossly non focal   Pulse 98, temperature 98.8 F (37.1 C), temperature source Oral, SpO2 95.00%.    Assessment/Plan Spontaneous pneumothorax, Left side for chest tube placement, further,  evaluation and treatment  Justin Garner E 02/21/2011, 4:59 PM

## 2011-02-21 NOTE — Progress Notes (Signed)
77 year old male was sent to the emergency department after an outpatient chest x-ray showed a left pneumothorax. He is to meet Dr. Edwyna Shell in the emergency department. He is currently resting comfortably in no acute distress vital signs are normal. Lung exam shows decreased breath sounds on the left consistent with a pneumothorax. X-ray done as an outpatient his reviewed showing approximately a 30% pneumothorax. Dr. Edwyna Shell has been consulted.

## 2011-02-21 NOTE — ED Notes (Signed)
Patient being transported upstairs on portable cardiac monitor with RN. 

## 2011-02-21 NOTE — ED Notes (Signed)
Gave old and new ECG to Dr. Preston Fleeting as soon as I performed.

## 2011-02-21 NOTE — ED Notes (Signed)
Received bedside report from [place], [place].  Patient moved from Pod A room 10 to exam room 25.  Patient's chest tube reconnected to suction; no air leak noted.  Family present at bedside.  Patient alert and oriented x4; updated patient on plan of care; informed patient that a bed request has been put in and that we are currently waiting for a bed to become available.  Will continue to monitor.

## 2011-02-21 NOTE — ED Notes (Signed)
Pt sleeping after procedure. Airway intact and vital signs stable. Pt tolerated chest tube placement well. Waiting on room assignment. Family remains at the bedside, happy meal given to wife at the bedside and advised of plan of care.

## 2011-02-22 ENCOUNTER — Inpatient Hospital Stay (HOSPITAL_COMMUNITY): Payer: Medicare Other

## 2011-02-22 DIAGNOSIS — J930 Spontaneous tension pneumothorax: Secondary | ICD-10-CM

## 2011-02-22 LAB — GLUCOSE, CAPILLARY: Glucose-Capillary: 206 mg/dL — ABNORMAL HIGH (ref 70–99)

## 2011-02-22 LAB — CBC
HCT: 46.2 % (ref 39.0–52.0)
MCV: 88.8 fL (ref 78.0–100.0)
RDW: 15.5 % (ref 11.5–15.5)
WBC: 9.8 10*3/uL (ref 4.0–10.5)

## 2011-02-22 LAB — MRSA PCR SCREENING: MRSA by PCR: NEGATIVE

## 2011-02-22 MED ORDER — WARFARIN SODIUM 5 MG PO TABS
5.0000 mg | ORAL_TABLET | Freq: Once | ORAL | Status: AC
  Administered 2011-02-22: 5 mg via ORAL
  Filled 2011-02-22: qty 1

## 2011-02-22 MED ORDER — MOXIFLOXACIN HCL 400 MG PO TABS
400.0000 mg | ORAL_TABLET | Freq: Every day | ORAL | Status: DC
  Administered 2011-02-22 – 2011-02-24 (×3): 400 mg via ORAL
  Filled 2011-02-22 (×4): qty 1

## 2011-02-22 MED ORDER — CELECOXIB 200 MG PO CAPS
200.0000 mg | ORAL_CAPSULE | Freq: Two times a day (BID) | ORAL | Status: DC
  Administered 2011-02-22 – 2011-02-24 (×6): 200 mg via ORAL
  Filled 2011-02-22 (×8): qty 1

## 2011-02-22 MED ORDER — INSULIN ASPART 100 UNIT/ML ~~LOC~~ SOLN
0.0000 [IU] | Freq: Three times a day (TID) | SUBCUTANEOUS | Status: DC
  Administered 2011-02-22: 5 [IU] via SUBCUTANEOUS
  Administered 2011-02-23: 2 [IU] via SUBCUTANEOUS
  Administered 2011-02-23: 5 [IU] via SUBCUTANEOUS
  Administered 2011-02-24: 2 [IU] via SUBCUTANEOUS
  Filled 2011-02-22: qty 3

## 2011-02-22 NOTE — Progress Notes (Signed)
                                                  Subjective: The patient is stable after chest tube insertion. Chest x-ray shows lung is expanded. Minimal air leak. Will time is 1.8. I will manage Coumadin. Initial white count was 13.5. We'll start her on Avelox. I will decrease suction to chest tube. Repeat labs in a.m. Objective: Vital signs in last 24 hours: Temp:  [97.5 F (36.4 C)-98.8 F (37.1 C)] 97.5 F (36.4 C) (12/05 0330) Pulse Rate:  [32-115] 77  (12/05 0330) Cardiac Rhythm:  [-] Atrial fibrillation (12/05 0330) Resp:  [16-24] 24  (12/05 0330) BP: (105-138)/(67-102) 138/93 mmHg (12/05 0330) SpO2:  [95 %-100 %] 98 % (12/05 0330) FiO2 (%):  [100 %] 100 % (12/04 1708) Weight:  [96.9 kg (213 lb 10 oz)] 213 lb 10 oz (96.9 kg) (12/04 2319)  Hemodynamic parameters for last 24 hours:    Intake/Output from previous day: 12/04 0701 - 12/05 0700 In: 123 [P.O.:120; I.V.:3] Out: 225 [Urine:225] Intake/Output this shift:    General appearance: alert Heart: regular rate and rhythm, S1, S2 normal, no murmur, click, rub or gallop Lungs: clear to auscultation bilaterally  Lab Results:  Basename 02/22/11 0330 02/21/11 1651  WBC 9.8 13.5*  HGB 15.8 17.3*  HCT 46.2 49.4  PLT 228 259   BMET:  Basename 02/21/11 1651  NA 137  K 3.9  CL 102  CO2 22  GLUCOSE 115*  BUN 23  CREATININE 1.36*  CALCIUM 9.5    PT/INR:  Basename 02/22/11 0330  LABPROT 21.8*  INR 1.86*   ABG    Component Value Date/Time   PHART 7.436 08/03/2007 0520   HCO3 21.0 08/03/2007 0520   TCO2 22 08/03/2007 0520   ACIDBASEDEF 3.0* 08/03/2007 0520   O2SAT 93.0 08/03/2007 0520   CBG (last 3)  No results found for this basename: GLUCAP:3 in the last 72 hours  Assessment/Plan: S/P   Decrease suction manage pro time.   LOS: 1 day    Cleatis Fandrich PATRICK 02/22/2011

## 2011-02-22 NOTE — Op Note (Signed)
NAMEKYLLIAN, CLINGERMAN NO.:  [id number]  MEDICAL RECORD NO.:  [id number]  LOCATION:  3302                         FACILITY:  [facility]  PHYSICIAN:  Ines Bloomer, M.D. DATE OF BIRTH:  November 18, 1932  DATE OF PROCEDURE: DATE OF DISCHARGE:                              OPERATIVE REPORT   PREOPERATIVE DIAGNOSIS:  Left pneumothorax.  POSTOPERATIVE DIAGNOSIS:  Left pneumothorax.  OPERATION PERFORMED:  Insertion of left chest tube.  SURGEON:  Ines Bloomer, MD  ANESTHESIA:  Xylocaine 1%, 4 mg of Versed, and 50 mg of fentanyl.  DESCRIPTION OF PROCEDURE:  After percutaneous insertion of all monitoring lines, the patient underwent anesthesia with 1% Xylocaine. The left chest was prepped and draped in usual sterile manner.  The patient was given 4 mg of Versed and 50 mg of fentanyl.  A time-out was done.  A 1-cm incision was made and dissection was carried down through the subcutaneous tissue through the fifth intercostal space.  The left chest was entered and air was evacuated.  We inserted a #20 chest tube without difficulty and sutured the chest tube in place with 0 silk.  Dry sterile dressing was applied.  The patient tolerated the procedure well.     Ines Bloomer, M.D.     DPB/MEDQ  D:  02/21/2011  T:  02/22/2011  Job:  [id number]

## 2011-02-23 ENCOUNTER — Inpatient Hospital Stay (HOSPITAL_COMMUNITY): Payer: Medicare Other

## 2011-02-23 LAB — GLUCOSE, CAPILLARY
Glucose-Capillary: 132 mg/dL — ABNORMAL HIGH (ref 70–99)
Glucose-Capillary: 226 mg/dL — ABNORMAL HIGH (ref 70–99)

## 2011-02-23 LAB — PROTIME-INR
INR: 1.83 — ABNORMAL HIGH (ref 0.00–1.49)
Prothrombin Time: 21.5 seconds — ABNORMAL HIGH (ref 11.6–15.2)

## 2011-02-23 MED ORDER — WARFARIN SODIUM 5 MG PO TABS
5.0000 mg | ORAL_TABLET | Freq: Once | ORAL | Status: AC
  Administered 2011-02-23: 5 mg via ORAL
  Filled 2011-02-23: qty 1

## 2011-02-23 NOTE — Progress Notes (Addendum)
   Subjective: Breathing comfortably this am.  No problems overnight.  Objective: Vital signs in last 24 hours: Patient Vitals for the past 24 hrs:  BP Temp Temp src Pulse Resp SpO2  02/23/11 0328 129/95 mmHg 97.2 F (36.2 C) Oral 70  18  94 %  02/22/11 2324 126/86 mmHg 97.4 F (36.3 C) Oral 88  21  96 %  02/22/11 2005 - - - - - 97 %  02/22/11 1943 115/80 mmHg 97.4 F (36.3 C) Oral 63  28  94 %  02/22/11 1600 110/75 mmHg 97.2 F (36.2 C) Oral 69  17  94 %  02/22/11 1200 103/75 mmHg 97.6 F (36.4 C) Oral 87  21  96 %  02/22/11 0800 141/92 mmHg 97.3 F (36.3 C) Oral 64  25  95 %   Current Weight  02/21/11 213 lb 10 oz (96.9 kg)     Intake/Output from previous day: 12/05 0701 - 12/06 0700 In: 1443 [P.O.:1440; I.V.:3] Out: 1325 [Urine:1325]    PHYSICAL EXAM:  Heart: irr irr Lungs: coarse bilateral BS  Chest tube: no air leak   Lab Results: CBC: Basename 02/22/11 0330 02/21/11 1651  WBC 9.8 13.5*  HGB 15.8 17.3*  HCT 46.2 49.4  PLT 228 259   BMET:  Basename 02/21/11 1651  NA 137  K 3.9  CL 102  CO2 22  GLUCOSE 115*  BUN 23  CREATININE 1.36*  CALCIUM 9.5    PT/INR:  Basename 02/23/11 0353  LABPROT 21.5*  INR 1.83*   Chest x-ray: no pneumothorax noted  Assessment/Plan: S/P left chest tube for spontaneous ptx-  No obvious air leak. Can probably place chest tube to H2O seal today. AF, rate controlled.  Continue Coumadin. Will give another 5 mg tonight. Pulm toilet, ambulate. Continue Avelox D#2   LOS: 2 days    COLLINS,GINA H 02/23/2011   agree with discontinuing suction. CXR stable      Elizer Bostic PATRICK

## 2011-02-24 ENCOUNTER — Inpatient Hospital Stay (HOSPITAL_COMMUNITY): Payer: Medicare Other

## 2011-02-24 LAB — GLUCOSE, CAPILLARY
Glucose-Capillary: 138 mg/dL — ABNORMAL HIGH (ref 70–99)
Glucose-Capillary: 120 mg/dL — ABNORMAL HIGH (ref 70–99)

## 2011-02-24 LAB — PROTIME-INR: Prothrombin Time: 21.1 seconds — ABNORMAL HIGH (ref 11.6–15.2)

## 2011-02-24 MED ORDER — MOXIFLOXACIN HCL 400 MG PO TABS: 400.0000 mg | ORAL_TABLET | Freq: Every day | ORAL | Status: DC

## 2011-02-24 MED ORDER — WARFARIN SODIUM 5 MG PO TABS
5.0000 mg | ORAL_TABLET | Freq: Once | ORAL | Status: AC
  Administered 2011-02-24: 5 mg via ORAL
  Filled 2011-02-24: qty 1

## 2011-02-24 MED ORDER — OXYCODONE HCL 5 MG PO TABS: 5.0000 mg | ORAL_TABLET | ORAL | Status: AC | PRN

## 2011-02-24 NOTE — Progress Notes (Addendum)
   Subjective: Breathing stable, no new complaints.  Objective: Vital signs in last 24 hours: Patient Vitals for the past 24 hrs:  BP Temp Temp src Pulse Resp SpO2  02/24/11 0328 125/72 mmHg 97.4 F (36.3 C) Oral 84  22  97 %  02/23/11 2044 - - - - - 98 %  02/23/11 2000 - - - 51  22  95 %  02/23/11 1900 128/78 mmHg 98.3 F (36.8 C) Oral 69  29  95 %  02/23/11 1500 98/69 mmHg 97.4 F (36.3 C) Oral 96  18  97 %  02/23/11 1200 133/74 mmHg 97.3 F (36.3 C) Oral 80  21  21 %  02/23/11 0929 - - - - - 96 %  02/23/11 0800 129/67 mmHg 97.3 F (36.3 C) Oral 78  18  -   Current Weight  02/21/11 213 lb 10 oz (96.9 kg)     Intake/Output from previous day: 12/06 0701 - 12/07 0700 In: 243 [P.O.:240; I.V.:3] Out: 775 [Urine:775]    PHYSICAL EXAM:  Heart: irr irr Lungs: coarse bilateral BS  Chest tube- no air leak  Lab Results: CBC: Basename 02/22/11 0330 02/21/11 1651  WBC 9.8 13.5*  HGB 15.8 17.3*  HCT 46.2 49.4  PLT 228 259   BMET:  Basename 02/21/11 1651  NA 137  K 3.9  CL 102  CO2 22  GLUCOSE 115*  BUN 23  CREATININE 1.36*  CALCIUM 9.5    PT/INR:  Basename 02/24/11 0450  LABPROT 21.1*  INR 1.79*   CXR- stable, no ptx  Assessment/Plan: S/P L CT for spontaneous ptx- Stable D/C chest tube today Check CXR in am Poss home this weekend if stable Continue Avelox D#3   LOS: 3 days    COLLINS,GINA H 02/24/2011   There is no airleak. Chest x-ray shows a lung is expanded. Will plan to discontinue chest tube. Cameron Proud

## 2011-02-24 NOTE — Discharge Summary (Signed)
Discharge Summary  Name: Justin Garner DOB: 28-Oct-1932 77 y.o. MRN: [id number]  Admission Date: 02/21/2011 Discharge Date:    Admitting Diagnosis: Active Problems:  Pneumothorax on left   Discharge Diagnosis:  Active Problems:  Pneumothorax on left diabetes mellitus Coronary artery disease COPD GERD History of pulmonary embolism Hyperlipidemia History of atrial fibrillation Hypertension History of resection of superior sulcus tumor, status post chemotherapy and radiation  Procedures: Placement of left chest tube  HPI:  The patient is a 77 y.o. male known to Dr. 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 Eldridge Dace today in routine cardiology follow up. . A chest xray was done which revealed a 30% L pneumothorax. He was sent to the ER for chest tube placement , further evaluation and treatment. About 4 days ago he developed a cough and some minor cold symptoms, but overall has been feeling well.    Hospital Course:  The patient was admitted to [facility] on 02/21/2011 for chest tube placement and management.  Dr. Edwyna Shell saw the patient and placed a 20 French left chest tube at the bedside without difficulty. The patient tolerated the procedure well.his chest tube was initially placed to suction and his pneumothorax was noted to be resolved on chest x-ray. Suction was subsequently weaned and placed ultimately to water seal. His chest x-ray remained stable with the lung reexpanded. Chest tube was discontinued on 02/24/2011. The patient was also started on Avelox for productive cough and upper respiratory symptoms. This is improved symptomatically during his hospital stay. He remained afebrile and his vital signs have been stable. He will undergo a repeat PA and lateral chest x-ray on 02/25/2011. If this is stable he will be discharged home. He is continued on Coumadin and at the present time his INR is 1.79.  Recent vital signs:    Filed Vitals:   02/24/11 1200  BP: 117/75  Pulse: 85  Temp: 98.1 F (36.7 C)  Resp: 18    Recent laboratory studies:  CBC: Basename 02/22/11 0330 02/21/11 1651  WBC 9.8 13.5*  HGB 15.8 17.3*  HCT 46.2 49.4  PLT 228 259   BMET:  Basename 02/21/11 1651  NA 137  K 3.9  CL 102  CO2 22  GLUCOSE 115*  BUN 23  CREATININE 1.36*  CALCIUM 9.5    PT/INR:  Basename 02/24/11 0450  LABPROT 21.1*  INR 1.79*    Discharge Medications:  Current Discharge Medication List    START taking these medications   Details  moxifloxacin (AVELOX) 400 MG tablet Take 1 tablet (400 mg total) by mouth daily. Qty: 5 tablet, Refills: 0    oxyCODONE (OXY IR/ROXICODONE) 5 MG immediate release tablet Take 1 tablet (5 mg total) by mouth every 4 (four) hours as needed for pain. Qty: 30 tablet, Refills: 0      CONTINUE these medications which have NOT CHANGED   Details  aspirin 81 MG tablet Take 81 mg by mouth daily.      atorvastatin (LIPITOR) 20 MG tablet Take 20 mg by mouth daily.      diltiazem (CARDIZEM CD) 240 MG 24 hr capsule Take 240 mg by mouth daily.      fenofibrate micronized (LOFIBRA) 67 MG capsule Take 67 mg by mouth daily before breakfast.      fish oil-omega-3 fatty acids 1000 MG capsule Take 2 g by mouth daily.      glipiZIDE (GLUCOTROL) 10 MG tablet Take 10  mg by mouth 2 (two) times daily before a meal.      levothyroxine (SYNTHROID, LEVOTHROID) 112 MCG tablet Take 112 mcg by mouth daily.      omeprazole (PRILOSEC) 20 MG capsule Take 20 mg by mouth daily.      warfarin (COUMADIN) 5 MG tablet Take 5-7.5 mg by mouth daily. Sun, Rica Mote, and Thurs-1 tab (5mg ), All other days-1.5 tabs (7.5mg )      STOP taking these medications     budesonide-formoterol (SYMBICORT) 160-4.5 MCG/ACT inhaler         Discharge Instructions:  The patient is to refrain from driving, heavy lifting or strenuous activity.  May shower daily and clean incisions with soap and water.  May resume  regular diet.  Discharge Orders    Future Appointments: Provider: Department: Dept Phone: Center:   03/02/2011 3:30 PM D Karle Plumber, MD [facility] [phone] [facility]      Follow-up Information    Follow up with Cameron Proud, MD on 03/02/2011. (Have a chest x-ray at 2:45, see MD at 3:30)    Contact information:   [address] [place] [address] [phone]           Adella Hare 02/24/2011, 4:29 PM

## 2011-02-25 ENCOUNTER — Inpatient Hospital Stay (HOSPITAL_COMMUNITY): Payer: Medicare Other

## 2011-02-25 LAB — GLUCOSE, CAPILLARY
Glucose-Capillary: 169 mg/dL — ABNORMAL HIGH (ref 70–99)
Glucose-Capillary: 133 mg/dL — ABNORMAL HIGH (ref 70–99)

## 2011-02-25 LAB — PROTIME-INR: INR: 0.98 (ref 0.00–1.49)

## 2011-02-25 MED ORDER — WARFARIN SODIUM 7.5 MG PO TABS
7.5000 mg | ORAL_TABLET | Freq: Once | ORAL | Status: DC
  Filled 2011-02-25: qty 1

## 2011-02-25 NOTE — Progress Notes (Addendum)
   Subjective: Stable, no new issues.  Objective: Vital signs in last 24 hours: Patient Vitals for the past 24 hrs:  BP Temp Temp src Pulse Resp SpO2  02/25/11 0300 132/92 mmHg 97.6 F (36.4 C) Oral 90  28  94 %  02/24/11 2300 115/81 mmHg 97.4 F (36.3 C) Oral 71  22  96 %  02/24/11 2116 - - - - - 97 %  02/24/11 2101 117/81 mmHg - - 71  22  97 %  02/24/11 1900 117/81 mmHg 97.4 F (36.3 C) Oral 67  24  98 %  02/24/11 1600 108/70 mmHg 97.7 F (36.5 C) Oral 56  15  95 %  02/24/11 1200 117/75 mmHg 98.1 F (36.7 C) Oral 85  18  98 %   Current Weight  02/21/11 96.9 kg (213 lb 10 oz)     Intake/Output from previous day: 12/07 0701 - 12/08 0700 In: 363 [P.O.:360; I.V.:3] Out: 1115 [Urine:1115]    PHYSICAL EXAM:  Heart: irr irr Lungs:coarse BS bilat   Lab Results: CBC:No results found for this basename: WBC:2,HGB:2,HCT:2,PLT:2 in the last 72 hours BMET: No results found for this basename: NA:2,K:2,CL:2,CO2:2,GLUCOSE:2,BUN:2,CREATININE:2,CALCIUM:2 in the last 72 hours  PT/INR:  Basename 02/25/11 0705  LABPROT 13.2  INR 0.98   Chest x-ray- ? Tiny L ptx, small R eff  Assessment/Plan: CXR stable.  INR decreased today.  Will give 7.5 mg Coumadin and resume home regimen.   Hopefully home after seen by MD.   LOS: 4 days    COLLINS,GINA H 02/25/2011   CXR shows lung is expanded. Discharge today.           Cameron Proud

## 2011-02-25 NOTE — Progress Notes (Signed)
Patient d/c home with wife. IV removed, pt tolerated well, discharged instructions given, pt verbalized understanding and signed forms. All forms given and pt taken to private vehicle by NT. VSS with no signs of SOB at d/c. Pt declined am meds, stated will take when home.

## 2011-03-02 ENCOUNTER — Ambulatory Visit (INDEPENDENT_AMBULATORY_CARE_PROVIDER_SITE_OTHER): Payer: Self-pay | Admitting: Thoracic Surgery

## 2011-03-02 ENCOUNTER — Ambulatory Visit
Admission: RE | Admit: 2011-03-02 | Discharge: 2011-03-02 | Disposition: A | Discharge status: Home / Self Care | Payer: Medicare Other | Source: Ambulatory Visit | Attending: Thoracic Surgery | Admitting: Thoracic Surgery

## 2011-03-02 ENCOUNTER — Other Ambulatory Visit: Payer: Self-pay | Admitting: Thoracic Surgery

## 2011-03-02 ENCOUNTER — Encounter: Payer: Self-pay | Admitting: Thoracic Surgery

## 2011-03-02 VITALS — BP 134/82 | HR 86 | Resp 20 | Ht 74.0 in | Wt 221.0 lb

## 2011-03-02 DIAGNOSIS — J930 Spontaneous tension pneumothorax: Secondary | ICD-10-CM

## 2011-03-02 DIAGNOSIS — Z9889 Other specified postprocedural states: Secondary | ICD-10-CM

## 2011-03-02 DIAGNOSIS — J9383 Other pneumothorax: Secondary | ICD-10-CM

## 2011-03-02 NOTE — Progress Notes (Signed)
HPI the patient returns for followup of her left pneumothorax. Chest x-ray shows no recurrence of his pneumothorax. We removed his chest tube sutures. We will see him back again in 3 weeks with a chest x-ray. He was released to for activity.   Current Outpatient Prescriptions  Medication Sig Dispense Refill  . aspirin 81 MG tablet Take 81 mg by mouth daily.        Marland Kitchen atorvastatin (LIPITOR) 20 MG tablet Take 20 mg by mouth daily.        Marland Kitchen diltiazem (CARDIZEM CD) 240 MG 24 hr capsule Take 240 mg by mouth daily.        . fenofibrate micronized (LOFIBRA) 67 MG capsule Take 67 mg by mouth daily before breakfast.        . fish oil-omega-3 fatty acids 1000 MG capsule Take 2 g by mouth daily.        Marland Kitchen glipiZIDE (GLUCOTROL) 10 MG tablet Take 10 mg by mouth 2 (two) times daily before a meal.        . levothyroxine (SYNTHROID, LEVOTHROID) 112 MCG tablet Take 112 mcg by mouth daily.        Marland Kitchen omeprazole (PRILOSEC) 20 MG capsule Take 20 mg by mouth daily.        Marland Kitchen oxyCODONE (OXY IR/ROXICODONE) 5 MG immediate release tablet Take 1 tablet (5 mg total) by mouth every 4 (four) hours as needed for pain.  30 tablet  0  . warfarin (COUMADIN) 5 MG tablet Take 5-7.5 mg by mouth daily. Sun, Rica Mote, and Thurs-1 tab (5mg ), All other days-1.5 tabs (7.5mg )         Review of Systems: Unchanged   Physical Exam lungs are clear to auscultation percussion   Diagnostic Tests: Chest x-ray shows lung is expanded   Impression: Left spontaneous pneumothorax status post chest tube insertion   Plan: Return in 3 weeks with chest x-ray

## 2011-03-15 ENCOUNTER — Other Ambulatory Visit: Payer: Self-pay | Admitting: Thoracic Surgery

## 2011-03-15 DIAGNOSIS — J930 Spontaneous tension pneumothorax: Secondary | ICD-10-CM

## 2011-03-22 ENCOUNTER — Ambulatory Visit (INDEPENDENT_AMBULATORY_CARE_PROVIDER_SITE_OTHER): Payer: Medicare Other | Admitting: Thoracic Surgery

## 2011-03-22 ENCOUNTER — Ambulatory Visit
Admission: RE | Admit: 2011-03-22 | Discharge: 2011-03-22 | Disposition: A | Discharge status: Home / Self Care | Payer: Medicare Other | Source: Ambulatory Visit | Attending: Thoracic Surgery | Admitting: Thoracic Surgery

## 2011-03-22 ENCOUNTER — Encounter: Payer: Self-pay | Admitting: Thoracic Surgery

## 2011-03-22 VITALS — BP 127/62 | HR 67 | Resp 20 | Ht 74.0 in | Wt 221.0 lb

## 2011-03-22 DIAGNOSIS — J9383 Other pneumothorax: Secondary | ICD-10-CM

## 2011-03-22 DIAGNOSIS — J930 Spontaneous tension pneumothorax: Secondary | ICD-10-CM

## 2011-03-22 DIAGNOSIS — Z9889 Other specified postprocedural states: Secondary | ICD-10-CM

## 2011-03-22 NOTE — Progress Notes (Signed)
HPI the patient returns today after having a left pneumothorax. Chest x-ray shows the lung is expanded. His chest tube incision is well healed. I will see him again in 5 months with a CT scan for followup of his lung cancer.   Current Outpatient Prescriptions  Medication Sig Dispense Refill  . aspirin 81 MG tablet Take 81 mg by mouth daily.        Marland Kitchen atorvastatin (LIPITOR) 20 MG tablet Take 20 mg by mouth daily.        Marland Kitchen diltiazem (CARDIZEM CD) 240 MG 24 hr capsule Take 240 mg by mouth daily.        . fenofibrate micronized (LOFIBRA) 67 MG capsule Take 67 mg by mouth daily before breakfast.        . fish oil-omega-3 fatty acids 1000 MG capsule Take 2 g by mouth daily.        Marland Kitchen glipiZIDE (GLUCOTROL) 10 MG tablet Take 10 mg by mouth 2 (two) times daily before a meal.        . levothyroxine (SYNTHROID, LEVOTHROID) 112 MCG tablet Take 112 mcg by mouth daily.        Marland Kitchen omeprazole (PRILOSEC) 20 MG capsule Take 20 mg by mouth daily.        Marland Kitchen warfarin (COUMADIN) 5 MG tablet Take 5-7.5 mg by mouth daily. 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 Left pneumothorax  Plan: Return in 5 months with CT scan

## 2011-04-17 ENCOUNTER — Emergency Department (HOSPITAL_COMMUNITY): Payer: Medicare Other

## 2011-04-17 ENCOUNTER — Encounter (HOSPITAL_COMMUNITY): Payer: Self-pay | Admitting: Emergency Medicine

## 2011-04-17 ENCOUNTER — Emergency Department (HOSPITAL_COMMUNITY)
Admission: EM | Admit: 2011-04-17 | Discharge: 2011-04-17 | Disposition: A | Discharge status: Home / Self Care | Payer: Medicare Other | Attending: Emergency Medicine | Admitting: Emergency Medicine

## 2011-04-17 DIAGNOSIS — Z7901 Long term (current) use of anticoagulants: Secondary | ICD-10-CM | POA: Insufficient documentation

## 2011-04-17 DIAGNOSIS — E785 Hyperlipidemia, unspecified: Secondary | ICD-10-CM | POA: Insufficient documentation

## 2011-04-17 DIAGNOSIS — S61209A Unspecified open wound of unspecified finger without damage to nail, initial encounter: Secondary | ICD-10-CM | POA: Insufficient documentation

## 2011-04-17 DIAGNOSIS — J449 Chronic obstructive pulmonary disease, unspecified: Secondary | ICD-10-CM | POA: Insufficient documentation

## 2011-04-17 DIAGNOSIS — K219 Gastro-esophageal reflux disease without esophagitis: Secondary | ICD-10-CM | POA: Insufficient documentation

## 2011-04-17 DIAGNOSIS — E119 Type 2 diabetes mellitus without complications: Secondary | ICD-10-CM | POA: Insufficient documentation

## 2011-04-17 DIAGNOSIS — M79609 Pain in unspecified limb: Secondary | ICD-10-CM | POA: Insufficient documentation

## 2011-04-17 DIAGNOSIS — S61219A Laceration without foreign body of unspecified finger without damage to nail, initial encounter: Secondary | ICD-10-CM

## 2011-04-17 DIAGNOSIS — IMO0002 Reserved for concepts with insufficient information to code with codable children: Secondary | ICD-10-CM | POA: Insufficient documentation

## 2011-04-17 DIAGNOSIS — Z7982 Long term (current) use of aspirin: Secondary | ICD-10-CM | POA: Insufficient documentation

## 2011-04-17 DIAGNOSIS — I2510 Atherosclerotic heart disease of native coronary artery without angina pectoris: Secondary | ICD-10-CM | POA: Insufficient documentation

## 2011-04-17 DIAGNOSIS — Z86718 Personal history of other venous thrombosis and embolism: Secondary | ICD-10-CM | POA: Insufficient documentation

## 2011-04-17 DIAGNOSIS — W010XXA Fall on same level from slipping, tripping and stumbling without subsequent striking against object, initial encounter: Secondary | ICD-10-CM | POA: Insufficient documentation

## 2011-04-17 DIAGNOSIS — Y92009 Unspecified place in unspecified non-institutional (private) residence as the place of occurrence of the external cause: Secondary | ICD-10-CM | POA: Insufficient documentation

## 2011-04-17 DIAGNOSIS — J4489 Other specified chronic obstructive pulmonary disease: Secondary | ICD-10-CM | POA: Insufficient documentation

## 2011-04-17 DIAGNOSIS — S63639A Sprain of interphalangeal joint of unspecified finger, initial encounter: Secondary | ICD-10-CM

## 2011-04-17 DIAGNOSIS — I4891 Unspecified atrial fibrillation: Secondary | ICD-10-CM | POA: Insufficient documentation

## 2011-04-17 DIAGNOSIS — Z79899 Other long term (current) drug therapy: Secondary | ICD-10-CM | POA: Insufficient documentation

## 2011-04-17 LAB — BASIC METABOLIC PANEL
BUN: 17 mg/dL (ref 6–23)
CO2: 25 mEq/L (ref 19–32)
Calcium: 9.2 mg/dL (ref 8.4–10.5)
Chloride: 103 mEq/L (ref 96–112)
Creatinine, Ser: 1.17 mg/dL (ref 0.50–1.35)
GFR calc Af Amer: 68 mL/min — ABNORMAL LOW (ref >90)
GFR calc non Af Amer: 59 mL/min — ABNORMAL LOW (ref >90)
Glucose, Bld: 175 mg/dL — ABNORMAL HIGH (ref 70–99)
Potassium: 4.2 mEq/L (ref 3.5–5.1)
Sodium: 139 mEq/L (ref 135–145)

## 2011-04-17 LAB — CBC
HCT: 47.9 % (ref 39.0–52.0)
Hemoglobin: 16.7 g/dL (ref 13.0–17.0)
MCH: 30.5 pg (ref 26.0–34.0)
MCHC: 34.9 g/dL (ref 30.0–36.0)
MCV: 87.4 fL (ref 78.0–100.0)
Platelets: 215 10*3/uL (ref 150–400)
RBC: 5.48 MIL/uL (ref 4.22–5.81)
RDW: 15.3 % (ref 11.5–15.5)
WBC: 9.2 10*3/uL (ref 4.0–10.5)

## 2011-04-17 LAB — PROTIME-INR
INR: 2.53 — ABNORMAL HIGH (ref 0.00–1.49)
Prothrombin Time: 27.7 seconds — ABNORMAL HIGH (ref 11.6–15.2)

## 2011-04-17 MED ORDER — OXYCODONE-ACETAMINOPHEN 5-325 MG PO TABS: 1.0000 | ORAL_TABLET | ORAL | Status: AC | PRN

## 2011-04-17 MED ORDER — CEPHALEXIN 500 MG PO CAPS: 500.0000 mg | ORAL_CAPSULE | Freq: Four times a day (QID) | ORAL | Status: AC

## 2011-04-17 NOTE — ED Notes (Signed)
Patient transported to X-ray 

## 2011-04-17 NOTE — Progress Notes (Signed)
Orthopedic Tech Progress Note Patient Details:  Justin Garner 04-18-1932 [id number]  Type of Splint: Finger Splint Location: left hand Splint Interventions: Application    Cammer, Mickie Bail 04/17/2011, 1:43 PM

## 2011-04-17 NOTE — ED Notes (Signed)
Larey Seat this am  Larey Seat back  Hurt left hand did not hit head

## 2011-04-17 NOTE — ED Provider Notes (Addendum)
History    78 year old male with left middle finger pain. Patient had a mechanical fall while walking out his back door. Fell backwards and onto his back. Thinks he caught his hand underneath him. Has a laceration to that finger. Cannot flex his finger. Was seen at an urgent care center and right for the emergency room. Patient has no other complaints. Denies numbness or tingling. Patient is on Coumadin for history of atrial fibrillation. Did not hit his head. No neck or back pain. No change in mental status per his wife. Has had tetanus shot within past 10 years.  CSN: [id number]  Arrival date & time 04/17/11  1052   First MD Initiated Contact with Patient 04/17/11 1112      Chief Complaint  Patient presents with  . Fall    (Consider location/radiation/quality/duration/timing/severity/associated sxs/prior treatment) HPI  Past Medical History  Diagnosis Date  . Diabetes mellitus   . Thyroid disease   . Coronary artery disease   . COPD (chronic obstructive pulmonary disease)   . GERD (gastroesophageal reflux disease)   . Pulmonary embolism   . Hyperlipidemia   . FH: colonic polyps   . PAF (paroxysmal atrial fibrillation)   . Hypertension   . ASCVD (arteriosclerotic cardiovascular disease)   . Cancer     Past Surgical History  Procedure Date  . Cholecystectomy   . Hernia repair   . Carotid stent   . Stent proximal   . Patent stents   . Inguinal hernia repair 10/10/2010  . Fiberoptic bronchoscopy and meiastinoscopy 06/07/2007  . R vats,thoracotomy and upper lobectomy 08/02/2007    Family History  Problem Relation Age of Onset  . Heart disease Father     History  Substance Use Topics  . Smoking status: Former Smoker -- 1.0 packs/day for 50 years    Quit date: 03/20/2001  . Smokeless tobacco: Former Neurosurgeon  . Alcohol Use: No      Review of Systems   Review of symptoms negative unless otherwise noted in HPI.   Allergies  Review of patient's allergies  indicates no known allergies.  Home Medications   Current Outpatient Rx  Name Route Sig Dispense Refill  . ASPIRIN 81 MG PO TABS Oral Take 81 mg by mouth daily.      . ATORVASTATIN CALCIUM 20 MG PO TABS Oral Take 20 mg by mouth daily.      Marland Kitchen DILTIAZEM HCL ER COATED BEADS 240 MG PO CP24 Oral Take 240 mg by mouth daily.      . FENOFIBRATE MICRONIZED 67 MG PO CAPS Oral Take 67 mg by mouth daily before breakfast.      . OMEGA-3 FATTY ACIDS 1000 MG PO CAPS Oral Take 1 g by mouth 2 (two) times daily.     Marland Kitchen GLIPIZIDE ER 10 MG PO TB24 Oral Take 10 mg by mouth daily.    Marland Kitchen LEVOTHYROXINE SODIUM 112 MCG PO TABS Oral Take 112 mcg by mouth daily.      Marland Kitchen OMEPRAZOLE 20 MG PO CPDR Oral Take 20 mg by mouth daily.      . WARFARIN SODIUM 5 MG PO TABS Oral Take 5-7.5 mg by mouth daily. Sun, Rica Mote, and Thurs-1 tab (5mg ), All other days-1.5 tabs (7.5mg )      BP 130/89  Pulse 60  Temp(Src) 98.4 F (36.9 C) (Oral)  Resp 19  SpO2 97%  Physical Exam  Nursing note and vitals reviewed. Constitutional: He appears well-developed and well-nourished. No distress.  HENT:  Head:  Normocephalic and atraumatic.  Eyes: Conjunctivae are normal. Right eye exhibits no discharge. Left eye exhibits no discharge.  Neck: Neck supple.  Cardiovascular: Normal rate, regular rhythm and normal heart sounds.  Exam reveals no gallop and no friction rub.   No murmur heard. Pulmonary/Chest: Effort normal and breath sounds normal. No respiratory distress.  Musculoskeletal:       Laceration at the flexor crease of the PIP joint left middle finger. Flexor tendon is exposed. Patient cannot flex finger at the PIP joint. Flexor function is intact at the PIP and MP joints against resistance. Neurovascular intact distally. No active bleeding.  Neurological: He is alert.  Skin: Skin is warm and dry. He is not diaphoretic.  Psychiatric: He has a normal mood and affect. His behavior is normal. Thought content normal.    ED Course  NERVE  BLOCK Date/Time: 04/17/2011 12:23 PM Performed by: Raeford Razor Authorized by: Raeford Razor Consent: Verbal consent obtained. Risks and benefits: risks, benefits and alternatives were discussed Consent given by: patient Patient identity confirmed: verbally with patient and provided demographic data Indications: pain relief and dislocation Body area: upper extremity Nerve: digital Laterality: left Patient sedated: no Preparation: Patient was prepped and draped in the usual sterile fashion. Patient position: sitting Needle gauge: 25 G Location technique: anatomical landmarks Local anesthetic: lidocaine 2% with epinephrine Anesthetic total: 2 ml Outcome: pain improved Patient tolerance: Patient tolerated the procedure well with no immediate complications.  Reduction of dislocation Date/Time: 04/17/2011 12:25 PM Performed by: Raeford Razor Authorized by: Raeford Razor Consent: Verbal consent obtained. Risks and benefits: risks, benefits and alternatives were discussed Consent given by: patient Required items: required blood products, implants, devices, and special equipment available Patient identity confirmed: verbally with patient, arm band and provided demographic data Preparation: Patient was prepped and draped in the usual sterile fashion. Local anesthesia used: yes Anesthesia: digital block Local anesthetic: lidocaine 2% with epinephrine Anesthetic total: 2 ml Patient sedated: no Patient tolerance: Patient tolerated the procedure well with no immediate complications. Comments: Dorsal PIP dislocation reduced with longitudinal traction with extension and then pressure to dorsal aspect of base of middle phalanx.   (including critical care time)  Labs Reviewed - No data to display Dg Finger Middle Left  04/17/2011  *RADIOLOGY REPORT*  Clinical Data: Left middle finger injury and pain.  PIP pain.  LEFT MIDDLE FINGER 2+V  Comparison: None  Findings: Posterior dislocation  at the PIP joint is noted. There may be a nondisplaced fracture of the proximal phalanx head. Overlying soft tissue swelling is noted. Mild degenerative changes at the PIP joint noted.  IMPRESSION: Posterior dislocation at the PIP joint with possible nondisplaced proximal phalangeal head fracture.  Original Report Authenticated By: Rosendo Gros, M.D.     1. Volar plate injury of finger   2. Open finger dislocation   3. Laceration of finger    12:36 PM Discussed case with Dr. Reita Cliche, hand surgery. He is fine with me loosely closing the laceration. Is recommending by mouth antibiotics. We'll see the patient in his office on Thursday.   MDM  78 year old male with an open dislocation of his left middle finger. This is reduced in the emergency room. Discussed the case with hand surgery. Laceration closed on their recommendations. Will start patient on Keflex and patient to followup as an outpatient with hand. Patient is on Coumadin. He did not hit his head though. He has no headaches. Has no complaints aside from his finger. There is no confusion  per him and his wife at bedside. Warfarin level is not supratherapeutic        Raeford Razor, MD 04/26/11 0454  Raeford Razor, MD 04/26/11 [phone]

## 2011-04-17 NOTE — ED Notes (Signed)
Went to [facility] and was sent here for further tests

## 2011-04-25 ENCOUNTER — Ambulatory Visit (INDEPENDENT_AMBULATORY_CARE_PROVIDER_SITE_OTHER): Payer: Medicare Other | Admitting: Pulmonary Disease

## 2011-04-25 ENCOUNTER — Encounter: Payer: Self-pay | Admitting: Pulmonary Disease

## 2011-04-25 DIAGNOSIS — J438 Other emphysema: Secondary | ICD-10-CM

## 2011-04-25 DIAGNOSIS — J939 Pneumothorax, unspecified: Secondary | ICD-10-CM

## 2011-04-25 NOTE — Progress Notes (Signed)
  Subjective:    Patient ID: Justin Garner, male    DOB: 19-Mar-1933, 78 y.o.   MRN: [id number]  HPI Patient comes in today for followup of his known COPD.  Since last visit, he had a spontaneous pneumothorax which required a tube thoracostomy in December of last year.  He has done well since from that standpoint.  He is only using his symbicort one puff each a.m., and feels that it has helped his breathing.  However, he continues to have dyspnea on exertion and a mild to moderate cough with white clear mucus.   Review of Systems  Constitutional: Positive for diaphoresis. Negative for fever and unexpected weight change.  HENT: Positive for rhinorrhea and sneezing. Negative for ear pain, nosebleeds, congestion, sore throat, trouble swallowing, dental problem, postnasal drip and sinus pressure.   Eyes: Positive for itching. Negative for redness.  Respiratory: Positive for cough and shortness of breath. Negative for chest tightness and wheezing.   Cardiovascular: Negative for palpitations and leg swelling.  Gastrointestinal: Negative for nausea and vomiting.  Genitourinary: Negative for dysuria.  Musculoskeletal: Negative for joint swelling.  Skin: Negative for rash.  Neurological: Negative for headaches.  Hematological: Bruises/bleeds easily.  Psychiatric/Behavioral: Negative for dysphoric mood. The patient is not nervous/anxious.        Objective:   Physical Exam Well-developed male in no acute distress Nose without purulence or discharge noted Chest with decreased breath sounds, no wheezing Cardiac exam with slight irregularity, but controlled ventricular response Lower extremities with mild ankle edema, no cyanosis Alert and oriented, moves all 4 extremities.       Assessment & Plan:

## 2011-04-25 NOTE — Patient Instructions (Signed)
Use symbicort 2 puffs in am AND pm everyday.  Keep mouth rinsed. Try and stay as active as possible. followup with me in 6mos.

## 2011-04-25 NOTE — Assessment & Plan Note (Signed)
The patient has moderate emphysema, and does see some improvement with symbicort despite only using it one time a day.  I have asked him to use it twice a day consistently, and suspect his breathing will improve further as well as his cough.  I have also asked him to stay as active as possible and to work on some type of conditioning program.  He will followup with me in 6 months.

## 2011-06-29 ENCOUNTER — Other Ambulatory Visit: Payer: Self-pay | Admitting: Thoracic Surgery

## 2011-06-29 DIAGNOSIS — Z9889 Other specified postprocedural states: Secondary | ICD-10-CM

## 2011-06-29 DIAGNOSIS — J9383 Other pneumothorax: Secondary | ICD-10-CM

## 2011-08-02 ENCOUNTER — Ambulatory Visit
Admission: RE | Admit: 2011-08-02 | Discharge: 2011-08-02 | Disposition: A | Discharge status: Home / Self Care | Payer: Medicare Other | Source: Ambulatory Visit | Attending: Thoracic Surgery | Admitting: Thoracic Surgery

## 2011-08-02 ENCOUNTER — Encounter: Payer: Self-pay | Admitting: Thoracic Surgery

## 2011-08-02 ENCOUNTER — Ambulatory Visit (INDEPENDENT_AMBULATORY_CARE_PROVIDER_SITE_OTHER): Payer: Medicare Other | Admitting: Thoracic Surgery

## 2011-08-02 ENCOUNTER — Ambulatory Visit: Payer: Medicare Other | Admitting: Thoracic Surgery

## 2011-08-02 DIAGNOSIS — Z9889 Other specified postprocedural states: Secondary | ICD-10-CM

## 2011-08-02 DIAGNOSIS — J9383 Other pneumothorax: Secondary | ICD-10-CM

## 2011-08-02 DIAGNOSIS — Z902 Acquired absence of lung [part of]: Secondary | ICD-10-CM

## 2011-08-02 NOTE — Progress Notes (Signed)
HPI patient returns for followup. A chest x-ray showed no recurrence of his cancer now 4 years since a right upper lobectomy for a superior sulcus tumor. He has a left apical bullae but no recurrence of his pneumothorax. Will release him back to his medical Dr. Reesa Chew will see him again if he has any future to her medical problems.  Current Outpatient Prescriptions  Medication Sig Dispense Refill  . aspirin 81 MG tablet Take 81 mg by mouth daily.        Marland Kitchen atorvastatin (LIPITOR) 20 MG tablet Take 20 mg by mouth daily.        Marland Kitchen diltiazem (CARDIZEM CD) 240 MG 24 hr capsule Take 240 mg by mouth daily.        . fenofibrate micronized (LOFIBRA) 67 MG capsule Take 67 mg by mouth daily before breakfast.        . fish oil-omega-3 fatty acids 1000 MG capsule Take 1 g by mouth 2 (two) times daily.       Marland Kitchen glipiZIDE (GLUCOTROL XL) 10 MG 24 hr tablet Take 10 mg by mouth daily.      Marland Kitchen JANUVIA 50 MG tablet Take 50 mg by mouth daily.       Marland Kitchen levothyroxine (SYNTHROID, LEVOTHROID) 112 MCG tablet Take 112 mcg by mouth daily.        Marland Kitchen omeprazole (PRILOSEC) 20 MG capsule Take 20 mg by mouth daily.        Marland Kitchen warfarin (COUMADIN) 5 MG tablet Take 5-7.5 mg by mouth daily. Sun, Rica Mote, and Thurs-1 tab (5mg ), All other days-1.5 tabs (7.5mg )         Review of Systems: Unchanged   Physical Exam lungs are clear to auscultation percussion   Diagnostic Tests: CT scan showed no evidence for recurrence of his cancer left apical bullae  Impression: Status post right upper lobectomy for the right superior sulcus cancer status post radiation and chemotherapy status post left pneumothorax   Plan: Return as needed

## 2011-10-24 ENCOUNTER — Ambulatory Visit (INDEPENDENT_AMBULATORY_CARE_PROVIDER_SITE_OTHER): Payer: Medicare Other | Admitting: Pulmonary Disease

## 2011-10-24 ENCOUNTER — Ambulatory Visit: Payer: Medicare Other | Admitting: Pulmonary Disease

## 2011-10-24 ENCOUNTER — Encounter: Payer: Self-pay | Admitting: Pulmonary Disease

## 2011-10-24 VITALS — BP 116/80 | HR 74 | Temp 98.0°F | Ht 74.0 in | Wt 213.8 lb

## 2011-10-24 DIAGNOSIS — J438 Other emphysema: Secondary | ICD-10-CM

## 2011-10-24 NOTE — Patient Instructions (Addendum)
Continue on symbicort twice a day. Check with insurance to see if alternatives are less expensive:  Check dulera 100/5, and advair 250/50. Stay as active as possible.  followup with me in one year if doing well.

## 2011-10-24 NOTE — Assessment & Plan Note (Signed)
The patient feels that his breathing has been stable since the last visit, although he only takes his medications on an intermittent basis.  He is unable to afford the co-pay for symbicort, and states that he will not fill his medication if he cannot afford it.  I have asked him to check with his insurance plan to see if Advair record dulera would be less expensive for him.  I have also encouraged him to work aggressively on some type of conditioning program.  If he is doing well, he is to followup with me in one year.

## 2011-10-24 NOTE — Progress Notes (Signed)
  Subjective:    Patient ID: Justin Garner, male    DOB: February 10, 1933, 78 y.o.   MRN: [id number]  HPI The patient comes in today for followup of his known emphysema.  He has been taking his symbicort on an intermittent basis because of the cost of the medication.  Despite this, he feels that he has been breathing fairly well, and has not had to use his rescue inhaler nor has he had an acute exacerbation.  He denies any significant cough, chest congestion, or purulent mucus.   Review of Systems  Constitutional: Negative for fever and unexpected weight change.  HENT: Positive for rhinorrhea. Negative for ear pain, nosebleeds, congestion, sore throat, sneezing, trouble swallowing, dental problem, postnasal drip and sinus pressure.   Eyes: Negative for redness and itching.  Respiratory: Positive for shortness of breath. Negative for cough, chest tightness and wheezing.   Cardiovascular: Negative for palpitations and leg swelling.  Gastrointestinal: Negative for nausea and vomiting.  Genitourinary: Negative for dysuria.  Musculoskeletal: Negative for joint swelling.  Skin: Negative for rash.  Neurological: Negative for headaches.  Hematological: Does not bruise/bleed easily.  Psychiatric/Behavioral: Negative for dysphoric mood. The patient is not nervous/anxious.   All other systems reviewed and are negative.       Objective:   Physical Exam Wd male in nad Nose without purulence or discharge noted. Chest with mild decrease in bs, no wheezing noted.  Cor with rrr, 2/6 sem LE with very mild edema, no cyanosis Alert and oriented, moves all 4.        Assessment & Plan:

## 2012-10-23 ENCOUNTER — Ambulatory Visit: Payer: Medicare Other | Admitting: Pulmonary Disease

## 2012-10-28 ENCOUNTER — Encounter: Payer: Self-pay | Admitting: Pulmonary Disease

## 2012-10-28 ENCOUNTER — Ambulatory Visit (INDEPENDENT_AMBULATORY_CARE_PROVIDER_SITE_OTHER): Payer: Medicare Other | Admitting: Pulmonary Disease

## 2012-10-28 VITALS — BP 140/80 | HR 68 | Temp 97.8°F | Ht 75.0 in | Wt 216.8 lb

## 2012-10-28 DIAGNOSIS — J438 Other emphysema: Secondary | ICD-10-CM

## 2012-10-28 MED ORDER — ALBUTEROL SULFATE HFA 108 (90 BASE) MCG/ACT IN AERS: 2.0000 | INHALATION_SPRAY | Freq: Four times a day (QID) | RESPIRATORY_TRACT | Status: DC | PRN

## 2012-10-28 NOTE — Progress Notes (Signed)
  Subjective:    Patient ID: Justin Garner, male    DOB: [birth year], 79 y.o.   MRN: [id number]  HPI The patient comes in today for followup of his known COPD.  He is using his symbicort intermittently, but feels overall that his breathing is near his usual baseline.  He has not had a recent acute exacerbation her pulmonary infection.  He is trying to stay as active as possible.   Review of Systems  Constitutional: Positive for diaphoresis. Negative for fever and unexpected weight change.  HENT: Negative for ear pain, nosebleeds, congestion, sore throat, rhinorrhea, sneezing, trouble swallowing, dental problem, postnasal drip and sinus pressure.   Eyes: Negative for redness and itching.  Respiratory: Positive for cough. Negative for chest tightness, shortness of breath and wheezing.   Cardiovascular: Negative for palpitations and leg swelling.  Gastrointestinal: Negative for nausea and vomiting.  Genitourinary: Negative for dysuria.  Musculoskeletal: Negative for joint swelling.  Skin: Negative for rash.  Neurological: Negative for headaches.  Hematological: Does not bruise/bleed easily.  Psychiatric/Behavioral: Negative for dysphoric mood. The patient is not nervous/anxious.        Objective:   Physical Exam Well-developed male in no acute distress Nose without purulence or discharge noted Neck without lymphadenopathy or thyromegaly Chest totally clear to auscultation Cardiac exam with regular rate and rhythm Lower extremities without significant edema, no cyanosis Alert and oriented, moves all 4 extremities.       Assessment & Plan:

## 2012-10-28 NOTE — Assessment & Plan Note (Signed)
The patient appears to be at a stable baseline from a COPD standpoint.  I have asked him to use his symbicort on a regular basis as much as possible, and we'll also make sure that he has a rescue inhaler available.  If doing well, he will follow with me in one year.

## 2012-10-28 NOTE — Patient Instructions (Addendum)
Try and stay on symbicort 2 puffs am and pm as much as you can. Will send a prescription for a rescue inhaler (albuterol), to use 2 puffs up to every 6 hrs if needed for emergencies. followup with me in one year.

## 2012-10-28 NOTE — Addendum Note (Signed)
Addended by: Orma Flaming D on: 10/28/2012 05:24 PM   Modules accepted: Orders

## 2012-12-24 ENCOUNTER — Ambulatory Visit (INDEPENDENT_AMBULATORY_CARE_PROVIDER_SITE_OTHER): Payer: Medicare Other | Admitting: Pharmacist

## 2012-12-24 DIAGNOSIS — I4891 Unspecified atrial fibrillation: Secondary | ICD-10-CM

## 2012-12-24 DIAGNOSIS — I482 Chronic atrial fibrillation, unspecified: Secondary | ICD-10-CM | POA: Insufficient documentation

## 2012-12-24 DIAGNOSIS — I2699 Other pulmonary embolism without acute cor pulmonale: Secondary | ICD-10-CM

## 2013-01-23 ENCOUNTER — Other Ambulatory Visit: Payer: Self-pay

## 2013-02-04 ENCOUNTER — Ambulatory Visit (INDEPENDENT_AMBULATORY_CARE_PROVIDER_SITE_OTHER): Payer: Medicare Other | Admitting: Pharmacist

## 2013-02-04 DIAGNOSIS — I2699 Other pulmonary embolism without acute cor pulmonale: Secondary | ICD-10-CM

## 2013-02-04 DIAGNOSIS — I4891 Unspecified atrial fibrillation: Secondary | ICD-10-CM

## 2013-02-07 ENCOUNTER — Encounter: Payer: Self-pay | Admitting: Interventional Cardiology

## 2013-03-18 ENCOUNTER — Ambulatory Visit (INDEPENDENT_AMBULATORY_CARE_PROVIDER_SITE_OTHER): Payer: Medicare Other | Admitting: Pharmacist

## 2013-03-18 DIAGNOSIS — I2699 Other pulmonary embolism without acute cor pulmonale: Secondary | ICD-10-CM

## 2013-03-18 DIAGNOSIS — I4891 Unspecified atrial fibrillation: Secondary | ICD-10-CM

## 2013-04-03 ENCOUNTER — Telehealth: Payer: Self-pay

## 2013-04-03 MED ORDER — ATORVASTATIN CALCIUM 20 MG PO TABS: 20.0000 mg | ORAL_TABLET | Freq: Every day | ORAL | Status: DC

## 2013-04-03 NOTE — Telephone Encounter (Signed)
Refilled

## 2013-04-09 ENCOUNTER — Other Ambulatory Visit: Payer: Self-pay

## 2013-04-09 ENCOUNTER — Other Ambulatory Visit: Payer: Self-pay | Admitting: *Deleted

## 2013-04-09 MED ORDER — FENOFIBRATE MICRONIZED 67 MG PO CAPS: 67.0000 mg | ORAL_CAPSULE | Freq: Every day | ORAL | Status: DC

## 2013-04-09 MED ORDER — WARFARIN SODIUM 5 MG PO TABS: ORAL_TABLET | ORAL | Status: DC

## 2013-04-29 ENCOUNTER — Ambulatory Visit (INDEPENDENT_AMBULATORY_CARE_PROVIDER_SITE_OTHER): Payer: Medicare Other | Admitting: *Deleted

## 2013-04-29 DIAGNOSIS — Z5181 Encounter for therapeutic drug level monitoring: Secondary | ICD-10-CM | POA: Insufficient documentation

## 2013-04-29 DIAGNOSIS — I4891 Unspecified atrial fibrillation: Secondary | ICD-10-CM

## 2013-04-29 DIAGNOSIS — I2699 Other pulmonary embolism without acute cor pulmonale: Secondary | ICD-10-CM

## 2013-04-29 LAB — POCT INR: INR: 2.2

## 2013-05-23 ENCOUNTER — Other Ambulatory Visit: Payer: Self-pay | Admitting: Internal Medicine

## 2013-05-23 DIAGNOSIS — E039 Hypothyroidism, unspecified: Secondary | ICD-10-CM

## 2013-05-26 ENCOUNTER — Ambulatory Visit
Admission: RE | Admit: 2013-05-26 | Discharge: 2013-05-26 | Disposition: A | Discharge status: Home / Self Care | Payer: Medicare Other | Source: Ambulatory Visit | Attending: Internal Medicine | Admitting: Internal Medicine

## 2013-05-26 DIAGNOSIS — E039 Hypothyroidism, unspecified: Secondary | ICD-10-CM

## 2013-05-27 ENCOUNTER — Ambulatory Visit: Payer: Medicare Other | Admitting: Interventional Cardiology

## 2013-05-28 ENCOUNTER — Ambulatory Visit (INDEPENDENT_AMBULATORY_CARE_PROVIDER_SITE_OTHER): Payer: Medicare Other | Admitting: Interventional Cardiology

## 2013-05-28 ENCOUNTER — Encounter: Payer: Self-pay | Admitting: Interventional Cardiology

## 2013-05-28 VITALS — BP 140/84 | HR 87 | Ht 73.0 in | Wt 214.0 lb

## 2013-05-28 DIAGNOSIS — I4891 Unspecified atrial fibrillation: Secondary | ICD-10-CM

## 2013-05-28 DIAGNOSIS — I2510 Atherosclerotic heart disease of native coronary artery without angina pectoris: Secondary | ICD-10-CM

## 2013-05-28 DIAGNOSIS — I10 Essential (primary) hypertension: Secondary | ICD-10-CM

## 2013-05-28 DIAGNOSIS — Z5181 Encounter for therapeutic drug level monitoring: Secondary | ICD-10-CM

## 2013-05-28 MED ORDER — NITROGLYCERIN 0.4 MG SL SUBL: 0.4000 mg | SUBLINGUAL_TABLET | SUBLINGUAL | Status: DC | PRN

## 2013-05-28 NOTE — Progress Notes (Signed)
Patient ID: Justin Garner, male   DOB: [birth year], 80 y.o.   MRN: [id number]    [address] [place], [place]  [address] Phone: [phone] Fax:  [phone]  Date:  05/28/2013   ID:  LONN IM, DOB Aug 21, 1932, MRN [id number]  PCP:  Kandice Hams, MD      History of Present Illness: LELAN CUSH is a 80 y.o. male who has CAD, AFib. Atrial Fibrillation F/U:  PTX in Dec 2012, treated with chest tube. c/o Shortness of breath at patient's baseline.  Denies : Chest pain.  Dizziness.  Leg edema.  Orthopnea.  Palpitations.  Syncope.   Was walking more in good weather.    Wt Readings from Last 3 Encounters:  05/28/13 214 lb (97.07 kg)  10/28/12 216 lb 12.8 oz (98.34 kg)  10/24/11 213 lb 12.8 oz (96.979 kg)     Past Medical History  Diagnosis Date  . Diabetes mellitus   . Thyroid disease   . Coronary artery disease   . COPD (chronic obstructive pulmonary disease)   . GERD (gastroesophageal reflux disease)   . Pulmonary embolism   . Hyperlipidemia   . FH: colonic polyps   . PAF (paroxysmal atrial fibrillation)   . Hypertension   . ASCVD (arteriosclerotic cardiovascular disease)   . Cancer     Current Outpatient Prescriptions  Medication Sig Dispense Refill  . albuterol (PROVENTIL HFA;VENTOLIN HFA) 108 (90 BASE) MCG/ACT inhaler Inhale 2 puffs into the lungs every 6 (six) hours as needed for wheezing.  1 Inhaler  3  . atorvastatin (LIPITOR) 20 MG tablet Take 1 tablet (20 mg total) by mouth daily.  90 tablet  3  . budesonide-formoterol (SYMBICORT) 160-4.5 MCG/ACT inhaler Inhale 2 puffs into the lungs as needed.      . diltiazem (CARDIZEM CD) 240 MG 24 hr capsule Take 240 mg by mouth daily.        . fenofibrate micronized (LOFIBRA) 67 MG capsule Take 1 capsule (67 mg total) by mouth daily before breakfast.  90 capsule  2  . fish oil-omega-3 fatty acids 1000 MG capsule Take 1 g by mouth 2 (two) times daily.       Marland Kitchen glipiZIDE (GLUCOTROL XL)  10 MG 24 hr tablet Take 10 mg by mouth daily.      Marland Kitchen JANUVIA 50 MG tablet Take 50 mg by mouth daily.       Marland Kitchen levothyroxine (SYNTHROID, LEVOTHROID) 112 MCG tablet Take 112 mcg by mouth daily.        Marland Kitchen omeprazole (PRILOSEC) 20 MG capsule Take 20 mg by mouth daily.        Marland Kitchen warfarin (COUMADIN) 5 MG tablet Take as directed by coumadin clinic  120 tablet  1   No current facility-administered medications for this visit.    Allergies:   No Known Allergies  Social History:  The patient  reports that he quit smoking about 12 years ago. His smoking use included Cigarettes. He has a 50 pack-year smoking history. He has quit using smokeless tobacco. He reports that he does not drink alcohol or use illicit drugs.   Family History:  The patient's family history includes Heart disease in his father.   ROS:  Please see the history of present illness.  No nausea, vomiting.  No fevers, chills.  No focal weakness.  No dysuria. Fatigue;   All other systems reviewed and negative.   PHYSICAL EXAM: VS:  BP 140/84  Pulse 87  Ht 6\' 1"  (1.854 m)  Wt 214 lb (97.07 kg)  BMI 28.24 kg/m2 Well nourished, well developed, in no acute distress HEENT: normal Neck: no JVD, no carotid bruits Cardiac:  normal S1, S2;irregularly, irregular Lungs:  clear to auscultation bilaterally, no wheezing, rhonchi or rales Abd: soft, nontender, no hepatomegaly Ext: no edema Skin: warm and dry Neuro:   no focal abnormalities noted  EKG:  AFib, rate controlled. NSST     2008: negative cardiolite 10/14 lipids TC 148, LDL 86; HDL 41  ASSESSMENT AND PLAN:  Atrial fibrillation  Continue Diltiazem HCl Capsule Extended Release 24 Hour, 240 MG, 1 capsule, Orally, Once a day IMAGING: EKG   Corson,Danielle 05/28/2012 11:06:06 AM > Treniece Holsclaw,JAY 05/28/2012 11:22:32 AM > AFib, rate controlled.   Notes: Rate controlled. COumadin for stroke prevention.  2. Coronary atherosclerosis of native coronary artery  Stop Aspirin Tablet, 81 MG,  1 tablet, Orally, Once a day; RF NTG Continue Fenofibrate Micronized Capsule, 67 MG, 1 capsule, Orally, Once a day Notes: stents in LAD and circumflex in 2003. No angina. LDL 80.  3. Anticoagulant monitoring  Continue Warfarin Sodium Tablet, 5 MG, per pharmD, Orally, 5 mg qd except 7.5 mg M/W Notes: INR to be checked and coumadin dose to be adjusted as needed.  Well controlled of late. 4. Essential hypertension, benign  Notes: Check BP at home. If BP is consistently more than 150/90, let us know and we can adjust medicatons.  Continue to check at home.  Signed, Mina Marble, MD, [facility] 05/28/2013 10:46 AM

## 2013-05-28 NOTE — Patient Instructions (Addendum)
Your physician recommends that you continue on your current medications as directed. Please refer to the Current Medication list given to you today.  Your physician wants you to follow-up in: 1 year with Dr. Irish Lack. You will receive a reminder letter in the mail two months in advance. If you don't receive a letter, please call our office to schedule the follow-up appointment.  Your physician has requested that you regularly monitor and record your blood pressure readings at home. Please use the same machine at the same time of day to check your readings and record them. Call if BP consistently runs above 150/90.

## 2013-06-10 ENCOUNTER — Ambulatory Visit (INDEPENDENT_AMBULATORY_CARE_PROVIDER_SITE_OTHER): Payer: Medicare Other | Admitting: Pharmacist

## 2013-06-10 DIAGNOSIS — I4891 Unspecified atrial fibrillation: Secondary | ICD-10-CM

## 2013-06-10 DIAGNOSIS — Z5181 Encounter for therapeutic drug level monitoring: Secondary | ICD-10-CM

## 2013-06-10 DIAGNOSIS — I2699 Other pulmonary embolism without acute cor pulmonale: Secondary | ICD-10-CM

## 2013-06-10 LAB — POCT INR: INR: 2.2

## 2013-06-24 ENCOUNTER — Emergency Department (HOSPITAL_COMMUNITY)
Admission: EM | Admit: 2013-06-24 | Discharge: 2013-06-24 | Disposition: A | Discharge status: Home / Self Care | Payer: Medicare Other | Attending: Emergency Medicine | Admitting: Emergency Medicine

## 2013-06-24 ENCOUNTER — Encounter (HOSPITAL_COMMUNITY): Payer: Self-pay | Admitting: Emergency Medicine

## 2013-06-24 ENCOUNTER — Emergency Department (HOSPITAL_COMMUNITY): Payer: Medicare Other

## 2013-06-24 DIAGNOSIS — E079 Disorder of thyroid, unspecified: Secondary | ICD-10-CM | POA: Insufficient documentation

## 2013-06-24 DIAGNOSIS — Z860100 Personal history of colon polyps, unspecified: Secondary | ICD-10-CM | POA: Insufficient documentation

## 2013-06-24 DIAGNOSIS — E119 Type 2 diabetes mellitus without complications: Secondary | ICD-10-CM | POA: Insufficient documentation

## 2013-06-24 DIAGNOSIS — I10 Essential (primary) hypertension: Secondary | ICD-10-CM | POA: Insufficient documentation

## 2013-06-24 DIAGNOSIS — R0600 Dyspnea, unspecified: Secondary | ICD-10-CM

## 2013-06-24 DIAGNOSIS — Z7901 Long term (current) use of anticoagulants: Secondary | ICD-10-CM | POA: Insufficient documentation

## 2013-06-24 DIAGNOSIS — Z86711 Personal history of pulmonary embolism: Secondary | ICD-10-CM | POA: Insufficient documentation

## 2013-06-24 DIAGNOSIS — I4891 Unspecified atrial fibrillation: Secondary | ICD-10-CM | POA: Insufficient documentation

## 2013-06-24 DIAGNOSIS — I2510 Atherosclerotic heart disease of native coronary artery without angina pectoris: Secondary | ICD-10-CM | POA: Insufficient documentation

## 2013-06-24 DIAGNOSIS — J438 Other emphysema: Secondary | ICD-10-CM | POA: Insufficient documentation

## 2013-06-24 DIAGNOSIS — Z79899 Other long term (current) drug therapy: Secondary | ICD-10-CM | POA: Insufficient documentation

## 2013-06-24 DIAGNOSIS — IMO0002 Reserved for concepts with insufficient information to code with codable children: Secondary | ICD-10-CM | POA: Insufficient documentation

## 2013-06-24 DIAGNOSIS — J439 Emphysema, unspecified: Secondary | ICD-10-CM

## 2013-06-24 DIAGNOSIS — J90 Pleural effusion, not elsewhere classified: Secondary | ICD-10-CM

## 2013-06-24 DIAGNOSIS — Z87891 Personal history of nicotine dependence: Secondary | ICD-10-CM | POA: Insufficient documentation

## 2013-06-24 DIAGNOSIS — Z8673 Personal history of transient ischemic attack (TIA), and cerebral infarction without residual deficits: Secondary | ICD-10-CM | POA: Insufficient documentation

## 2013-06-24 DIAGNOSIS — R230 Cyanosis: Secondary | ICD-10-CM | POA: Insufficient documentation

## 2013-06-24 DIAGNOSIS — K219 Gastro-esophageal reflux disease without esophagitis: Secondary | ICD-10-CM | POA: Insufficient documentation

## 2013-06-24 DIAGNOSIS — Z85118 Personal history of other malignant neoplasm of bronchus and lung: Secondary | ICD-10-CM | POA: Insufficient documentation

## 2013-06-24 DIAGNOSIS — R000 Tachycardia, unspecified: Secondary | ICD-10-CM | POA: Insufficient documentation

## 2013-06-24 DIAGNOSIS — E785 Hyperlipidemia, unspecified: Secondary | ICD-10-CM | POA: Insufficient documentation

## 2013-06-24 LAB — BASIC METABOLIC PANEL
BUN: 19 mg/dL (ref 6–23)
CALCIUM: 9 mg/dL (ref 8.4–10.5)
CO2: 24 mEq/L (ref 19–32)
CREATININE: 1.46 mg/dL — AB (ref 0.50–1.35)
Chloride: 102 mEq/L (ref 96–112)
GFR, EST AFRICAN AMERICAN: 51 mL/min — AB (ref >90)
GFR, EST NON AFRICAN AMERICAN: 44 mL/min — AB (ref >90)
GLUCOSE: 145 mg/dL — AB (ref 70–99)
Potassium: 3.6 mEq/L — ABNORMAL LOW (ref 3.7–5.3)
Sodium: 143 mEq/L (ref 137–147)

## 2013-06-24 LAB — I-STAT TROPONIN, ED: TROPONIN I, POC: 0.01 ng/mL (ref 0.00–0.08)

## 2013-06-24 LAB — CBC
HCT: 46.6 % (ref 39.0–52.0)
Hemoglobin: 16.1 g/dL (ref 13.0–17.0)
MCH: 31.4 pg (ref 26.0–34.0)
MCHC: 34.5 g/dL (ref 30.0–36.0)
MCV: 91 fL (ref 78.0–100.0)
Platelets: 219 10*3/uL (ref 150–400)
RBC: 5.12 MIL/uL (ref 4.22–5.81)
RDW: 15.4 % (ref 11.5–15.5)
WBC: 11.3 10*3/uL — ABNORMAL HIGH (ref 4.0–10.5)

## 2013-06-24 LAB — PROTIME-INR
INR: 2.29 — ABNORMAL HIGH (ref 0.00–1.49)
Prothrombin Time: 24.5 seconds — ABNORMAL HIGH (ref 11.6–15.2)

## 2013-06-24 MED ORDER — PREDNISONE 20 MG PO TABS
60.0000 mg | ORAL_TABLET | Freq: Once | ORAL | Status: AC
  Administered 2013-06-24: 60 mg via ORAL
  Filled 2013-06-24: qty 3

## 2013-06-24 MED ORDER — ALBUTEROL SULFATE (2.5 MG/3ML) 0.083% IN NEBU
2.5000 mg | INHALATION_SOLUTION | Freq: Once | RESPIRATORY_TRACT | Status: AC
  Administered 2013-06-24: 2.5 mg via RESPIRATORY_TRACT
  Filled 2013-06-24: qty 3

## 2013-06-24 MED ORDER — IOHEXOL 350 MG/ML SOLN
100.0000 mL | Freq: Once | INTRAVENOUS | Status: AC | PRN
  Administered 2013-06-24: 80 mL via INTRAVENOUS

## 2013-06-24 MED ORDER — PREDNISONE 20 MG PO TABS: 60.0000 mg | ORAL_TABLET | Freq: Every day | ORAL | Status: DC

## 2013-06-24 MED ORDER — FUROSEMIDE 20 MG PO TABS: 20.0000 mg | ORAL_TABLET | Freq: Every day | ORAL | Status: DC

## 2013-06-24 NOTE — Discharge Instructions (Signed)
Chronic Obstructive Pulmonary Disease  Chronic obstructive pulmonary disease (COPD) is a common lung condition in which airflow from the lungs is limited. COPD is a general term that can be used to describe many different lung problems that limit airflow, including both chronic bronchitis and emphysema.  If you have COPD, your lung function will probably never return to normal, but there are measures you can take to improve lung function and make yourself feel better.   CAUSES   · Smoking (common).    · Exposure to secondhand smoke.    · Genetic problems.  · Chronic inflammatory lung diseases or recurrent infections.  SYMPTOMS   · Shortness of breath, especially with physical activity.    · Deep, persistent (chronic) cough with a large amount of thick mucus.    · Wheezing.    · Rapid breaths (tachypnea).    · Gray or bluish discoloration (cyanosis) of the skin, especially in fingers, toes, or lips.    · Fatigue.    · Weight loss.    · Frequent infections or episodes when breathing symptoms become much worse (exacerbations).    · Chest tightness.  DIAGNOSIS   Your healthcare provider will take a medical history and perform a physical examination to make the initial diagnosis.  Additional tests for COPD may include:   · Lung (pulmonary) function tests.  · Chest X-ray.  · CT scan.  · Blood tests.  TREATMENT   Treatment available to help you feel better when you have COPD include:   · Inhaler and nebulizer medicines. These help manage the symptoms of COPD and make your breathing more comfortable  · Supplemental oxygen. Supplemental oxygen is only helpful if you have a low oxygen level in your blood.    · Exercise and physical activity. These are beneficial for nearly all people with COPD. Some people may also benefit from a pulmonary rehabilitation program.  HOME CARE INSTRUCTIONS   · Take all medicines (inhaled or pills) as directed by your health care provider.  · Only take over-the-counter or prescription medicines  for pain, fever, or discomfort as directed by your health care provider.    · Avoid over-the-counter medicines or cough syrups that dry up your airway (such as antihistamines) and slow down the elimination of secretions unless instructed otherwise by your healthcare provider.    · If you are a smoker, the most important thing that you can do is stop smoking. Continuing to smoke will cause further lung damage and breathing trouble. Ask your health care provider for help with quitting smoking. He or she can direct you to community resources or hospitals that provide support.  · Avoid exposure to irritants such as smoke, chemicals, and fumes that aggravate your breathing.  · Use oxygen therapy and pulmonary rehabilitation if directed by your health care provider. If you require home oxygen therapy, ask your healthcare provider whether you should purchase a pulse oximeter to measure your oxygen level at home.    · Avoid contact with individuals who have a contagious illness.  · Avoid extreme temperature and humidity changes.  · Eat healthy foods. Eating smaller, more frequent meals and resting before meals may help you maintain your strength.  · Stay active, but balance activity with periods of rest. Exercise and physical activity will help you maintain your ability to do things you want to do.  · Preventing infection and hospitalization is very important when you have COPD. Make sure to receive all the vaccines your health care provider recommends, especially the pneumococcal and influenza vaccines. Ask your healthcare provider whether you   in (inhaling) through your nose for 1 second. Then, purse your lips as if you were going to whistle and breathe out (exhale)  through the pursed lips for 2 seconds.   Diaphragmatic breathing. Start by putting one hand on your abdomen just above your waist. Inhale slowly through your nose. The hand on your abdomen should move out. Then purse your lips and exhale slowly. You should be able to feel the hand on your abdomen moving in as you exhale.   Learn and use controlled coughing to clear mucus from your lungs. Controlled coughing is a series of short, progressive coughs. The steps of controlled coughing are:  1. Lean your head slightly forward.  2. Breathe in deeply using diaphragmatic breathing.  3. Try to hold your breath for 3 seconds.  4. Keep your mouth slightly open while coughing twice.  5. Spit any mucus out into a tissue.  6. Rest and repeat the steps once or twice as needed. SEEK MEDICAL CARE IF:   You are coughing up more mucus than usual.   There is a change in the color or thickness of your mucus.   Your breathing is more labored than usual.   Your breathing is faster than usual.  SEEK IMMEDIATE MEDICAL CARE IF:   You have shortness of breath while you are resting.   You have shortness of breath that prevents you from:  Being able to talk.   Performing your usual physical activities.   You have chest pain lasting longer than 5 minutes.   Your skin color is more cyanotic than usual.  You measure low oxygen saturations for longer than 5 minutes with a pulse oximeter. MAKE SURE YOU:   Understand these instructions.  Will watch your condition.  Will get help right away if you are not doing well or get worse. Document Released: 12/14/2004 Document Revised: 12/25/2012 Document Reviewed: 10/31/2012 [facility] Patient Information 2014 [place], [place].  Pleural Effusion The lining covering your lungs and the inside of your chest is called the pleura. Usually, the space between the 2 pleura contains no air and only a thin layer of fluid. A pleural effusion is an abnormal  buildup of fluid in the pleural space. Fluid gathers when there is increased pressure in the lung vessels. This forces fluids out of the lungs and into the pleural space. Vessels may also leak fluids when there are infections, such as pneumonia, or other causes of soreness and redness (inflammation). Fluids leak into the lungs when protein in the blood is low or when certain vessels (lymphatics) are blocked. Finding a pleural effusion is important because it is usually caused by another disease. In order to treat a pleural effusion, your caregiver needs to find its cause. If left untreated, a large amount of fluid can build up and cause collapse of the lung. CAUSES   Heart failure.  Infections (pneumonia, tuberculosis), pulmonary embolism, pulmonary infarction.  Cancer (primary lung and metastatic), asbestosis.  Liver failure (cirrhosis).  Nephrotic syndrome, peritoneal dialysis, kidney problems (uremia).  Collagen vascular disease (systemic lupus erythematosis, rheumatoid arthritis).  Injury (trauma) to the chest or rupture of the digestive tube (esophagus).  Material in the chest or pleural space (hemothorax, chylothorax).  Pancreatitis.  Surgery.  Drug reactions. SYMPTOMS  A pleural effusion can decrease the amount of space available for breathing and make you short of breath. The fluid can become infected, which may cause pain and fever. Often, the pain is worse when taking a deep breath. The  underlying disease (heart failure, pneumonia, blood clot, tuberculosis, cancer) may also cause symptoms. DIAGNOSIS   Your caregiver can usually tell what is wrong by talking to you (taking a history), doing an exam, and taking a routine X-ray. If the X-ray shows fluid in your chest, often fluid is removed from your chest with a needle for testing (diagnostic thoracentesis).  Sometimes, more specialized X-rays may be needed.  Sometimes, a small piece of tissue is removed and examined by a  specialist (biopsy). TREATMENT  Treatment varies based on what caused the pleural effusion. Treatments include:  Removing as much fluid as possible using a needle (thoracentesis) to improve the cough and shortness of breath. This is a simple procedure which can be done at bedside. The risks are bleeding, infection, collapse of a lung, or low blood pressure.  Placing a tube in the chest to drain the effusion (tube thoracostomy). This is often used when there is an infection in the fluid. This is a simple procedure which can often be done at bedside or in a clinic. The procedure may be painful. The risks are the same as using a needle to drain the fluid. The chest tube usually remains for a few days and is connected to suction to improve fluid drainage. The tube, after placement, usually does not cause much discomfort.  Surgical removal of fibrous debris in and around the pleural space (decortication). This may be done with a flexible telescope (thoracoscope) through a small or large cut (incision). This is helpful for patients who have fibrosis or scar tissue that prevents complete lung expansion. The risks are infection, blood loss, and side effects from general anesthesia.  Sometimes, a procedure called pleurodesis is done. A chest tube is placed and the fluid is drained. Next, an agent (tetracycline, talc powder) is added to the pleural space. This causes the lung and chest wall to stick together (adhesion). This leaves no potential space for fluid to build up. The risks include infection, blood loss, and side effects from general anesthesia.  If the effusion is caused by infection, it may be treated with antibiotics and improve without draining. HOME CARE INSTRUCTIONS   Take any medicines exactly as prescribed.  Follow up with your caregiver as directed.  Monitor your exercise capacity (the amount of walking you can do before you get short of breath).  Do not smoke. Ask your caregiver for  help quitting. SEEK MEDICAL CARE IF:   Your exercise capacity seems to get worse or does not improve with time.  You do not recover from your illness. SEEK IMMEDIATE MEDICAL CARE IF:   Shortness of breath or chest pain develops or gets worse.  You have an oral temperature above 102 F (38.9 C), not controlled by medicine.  You develop a new cough, especially if the mucus (phlegm) is discolored. MAKE SURE YOU:   Understand these instructions.  Will watch your condition.  Will get help right away if you are not doing well or get worse. Document Released: 03/06/2005 Document Revised: 12/25/2012 Document Reviewed: 10/26/2006 [facility] Patient Information 2014 [place].

## 2013-06-24 NOTE — ED Notes (Signed)
Patient transported to CT 

## 2013-06-24 NOTE — ED Notes (Signed)
Family at bedside. 

## 2013-06-24 NOTE — ED Provider Notes (Signed)
CSN: [id number]     Arrival date & time 06/24/13  1506 History   First MD Initiated Contact with Patient 06/24/13 1552     Chief Complaint  Patient presents with  . Shortness of Breath     (Consider location/radiation/quality/duration/timing/severity/associated sxs/prior Treatment) Patient is a 80 y.o. male presenting with shortness of breath. The history is provided by the patient.  Shortness of Breath Severity:  Moderate Associated symptoms: no abdominal pain, no chest pain, no headaches, no rash and no vomiting    patient presents with shortness of breath. Has been going for the last 4 days. Worse with laying down. His been having difficulty sleeping at night due to with. No chest pain. He's had a chronic cough without changes. No fevers. No swelling in his legs. He has a history of spontaneous pneumothorax. He has emphysema. He also has a history of paroxysmal atrial fibrillation. He said previous pulmonary embolisms and is on Coumadin for both the pulmonary embolism and atrial fibrillation. Family states his face is more red than usual.  Past Medical History  Diagnosis Date  . Diabetes mellitus   . Thyroid disease   . Coronary artery disease   . COPD (chronic obstructive pulmonary disease)   . GERD (gastroesophageal reflux disease)   . Pulmonary embolism   . Hyperlipidemia   . FH: colonic polyps   . PAF (paroxysmal atrial fibrillation)   . Hypertension   . ASCVD (arteriosclerotic cardiovascular disease)   . Cancer    Past Surgical History  Procedure Laterality Date  . Cholecystectomy    . Hernia repair    . Carotid stent    . Stent proximal    . Patent stents    . Inguinal hernia repair  10/10/2010  . Fiberoptic bronchoscopy and meiastinoscopy  06/07/2007  . R vats,thoracotomy and upper lobectomy  08/02/2007   Family History  Problem Relation Age of Onset  . Heart disease Father    History  Substance Use Topics  . Smoking status: Former Smoker -- 1.00 packs/day for  50 years    Types: Cigarettes    Quit date: 03/20/2001  . Smokeless tobacco: Former Systems developer  . Alcohol Use: No    Review of Systems  Constitutional: Negative for activity change and appetite change.  Eyes: Negative for pain.  Respiratory: Positive for shortness of breath. Negative for chest tightness.   Cardiovascular: Negative for chest pain and leg swelling.  Gastrointestinal: Negative for nausea, vomiting, abdominal pain and diarrhea.  Genitourinary: Negative for flank pain.  Musculoskeletal: Negative for back pain and neck stiffness.  Skin: Positive for color change. Negative for rash.  Neurological: Negative for weakness, numbness and headaches.  Psychiatric/Behavioral: Negative for behavioral problems.      Allergies  Review of patient's allergies indicates no known allergies.  Home Medications   Current Outpatient Rx  Name  Route  Sig  Dispense  Refill  . atorvastatin (LIPITOR) 20 MG tablet   Oral   Take 20 mg by mouth every morning.         . budesonide-formoterol (SYMBICORT) 160-4.5 MCG/ACT inhaler   Inhalation   Inhale 2 puffs into the lungs 2 (two) times daily.          Marland Kitchen diltiazem (CARDIZEM CD) 240 MG 24 hr capsule   Oral   Take 240 mg by mouth daily.           . fenofibrate micronized (LOFIBRA) 67 MG capsule   Oral   Take 1 capsule (67  mg total) by mouth daily before breakfast.   90 capsule   2   . fish oil-omega-3 fatty acids 1000 MG capsule   Oral   Take 1 g by mouth 2 (two) times daily.          Marland Kitchen glipiZIDE (GLUCOTROL XL) 10 MG 24 hr tablet   Oral   Take 10 mg by mouth daily with breakfast.          . JANUVIA 50 MG tablet   Oral   Take 50 mg by mouth every morning.          Marland Kitchen levothyroxine (SYNTHROID, LEVOTHROID) 112 MCG tablet   Oral   Take 112 mcg by mouth daily.           Marland Kitchen omeprazole (PRILOSEC) 20 MG capsule   Oral   Take 20 mg by mouth daily.           Marland Kitchen oxyCODONE-acetaminophen (PERCOCET/ROXICET) 5-325 MG per  tablet   Oral   Take 1 tablet by mouth daily as needed (for sleep and pain relief).         . warfarin (COUMADIN) 5 MG tablet   Oral   Take 5-7.5 mg by mouth daily at 6 PM. Takes 7.5mg  on Mon, Wed and Fri Takes 5mg  all other days         . albuterol (PROVENTIL HFA;VENTOLIN HFA) 108 (90 BASE) MCG/ACT inhaler   Inhalation   Inhale 2 puffs into the lungs every 6 (six) hours as needed for wheezing.   1 Inhaler   3   . furosemide (LASIX) 20 MG tablet   Oral   Take 1 tablet (20 mg total) by mouth daily.   4 tablet   0   . nitroGLYCERIN (NITROSTAT) 0.4 MG SL tablet   Sublingual   Place 1 tablet (0.4 mg total) under the tongue every 5 (five) minutes as needed for chest pain.   25 tablet   5   . predniSONE (DELTASONE) 20 MG tablet   Oral   Take 3 tablets (60 mg total) by mouth daily.   9 tablet   0    BP 138/64  Pulse 89  Temp(Src) 97.5 F (36.4 C) (Oral)  Resp 14  Ht 6\' 1"  (1.854 m)  Wt 210 lb (95.255 kg)  BMI 27.71 kg/m2  SpO2 96% Physical Exam  Constitutional: He is oriented to person, place, and time. He appears well-developed.  HENT:  Head: Normocephalic.  Eyes: Pupils are equal, round, and reactive to light.  Cardiovascular:  Mild tachycardia  Pulmonary/Chest: Effort normal.  Right posterior upper chest postsurgical. Relatively equal breath sounds bilaterally.  Abdominal: Soft. There is no tenderness.  Musculoskeletal: He exhibits no edema.  Chronic darkening/cyanosis of bilateral lower extremities. Very prominent veins. This is at baseline per family members.  Neurological: He is alert and oriented to person, place, and time.  Skin: Skin is warm.    ED Course  Procedures (including critical care time) Labs Review Labs Reviewed  BASIC METABOLIC PANEL - Abnormal; Notable for the following:    Potassium 3.6 (*)    Glucose, Bld 145 (*)    Creatinine, Ser 1.46 (*)    GFR calc non Af Amer 44 (*)    GFR calc Af Amer 51 (*)    All other components within  normal limits  CBC - Abnormal; Notable for the following:    WBC 11.3 (*)    All other components within normal limits  PROTIME-INR -  Abnormal; Notable for the following:    Prothrombin Time 24.5 (*)    INR 2.29 (*)    All other components within normal limits  I-STAT TROPOININ, ED   Imaging Review Dg Chest 2 View (if Patient Has Fever And/or Copd)  06/24/2013   CLINICAL DATA:  Shortness of breath, weakness  EXAM: CHEST  2 VIEW  COMPARISON:  05/26/2013  FINDINGS: Cardiomediastinal silhouette is stable. There is small partially loculated right pleural effusion. Stable scarring in chronic blunting of costophrenic angle. No acute infiltrate or pulmonary edema. Osteopenia and degenerative changes thoracic spine.  IMPRESSION: No active disease. Stable scarring and chronic blunting of the right costophrenic angle. Small probable loculated right pleural effusion.   Electronically Signed   By: Lahoma Crocker M.D.   On: 06/24/2013 16:28   Ct Angio Chest W/cm &/or Wo Cm  06/24/2013   CLINICAL DATA:  Worsening shortness of breath over the past 2 days. History of lobectomy on the right for lung cancer.  EXAM: CT ANGIOGRAPHY CHEST WITH CONTRAST  TECHNIQUE: Multidetector CT imaging of the chest was performed using the standard protocol during bolus administration of intravenous contrast. Multiplanar CT image reconstructions and MIPs were obtained to evaluate the vascular anatomy.  CONTRAST:  80 mL OMNIPAQUE IOHEXOL 350 MG/ML SOLN  COMPARISON:  CT chest 05/26/2013. PA and lateral chest 06/24/2013 and 01/22/2012.  FINDINGS: No pulmonary embolus is identified. Since the prior CT scan, there has been marked increase in a small to moderate right pleural effusion. A small left pleural effusion is new since the prior study. There is cardiomegaly. No pericardial effusion is present. Calcific aortic and coronary atherosclerosis is identified. There is a new right infrahilar lymph node measuring 1.6 cm on image 57. Small AP window  lymph nodes do not appear markedly changed. A subcarinal node on image 49 measures 1.4 cm short axis dimension compared to 0.7 cm.  The lungs again demonstrate extensive centrilobular emphysema. The patient appears to be status post right upper lobectomy with suture identified. There is some compressive atelectasis in lung bases. No consolidative process is identified.  Visualized upper abdomen demonstrates no focal abnormality. No lytic or sclerotic bony lesion is seen.  Review of the MIP images confirms the above findings.  IMPRESSION: Negative for pulmonary embolus. Small to moderate right and small left pleural effusion with associated mild compressive atelectasis.  New small right hilar and subcarinal lymph nodes since the most recent exam are nonspecific and may be reactive. Recommend attention on follow-up exams.  Cardiomegaly.  Marked emphysema.  Calcific aortic and coronary atherosclerosis.   Electronically Signed   By: Inge Rise M.D.   On: 06/24/2013 19:45     EKG Interpretation   Date/Time:  Tuesday June 24 2013 15:26:05 EDT Ventricular Rate:  104 PR Interval:    QRS Duration: 94 QT Interval:  360 QTC Calculation: 473 R Axis:   93 Text Interpretation:  Atrial fibrillation with rapid ventricular response  Rightward axis Cannot rule out Anterior infarct , age undetermined  Abnormal ECG Confirmed by Alvino Chapel  MD, Ovid Curd [id number]) on 06/24/2013  4:03:53 PM      MDM   Final diagnoses:  Dyspnea  Emphysema of lung  Pleural effusion    Patient with shortness of breath. Has bilateral effusions. CT scan done due to history pulmonary embolism. No pulmonary embolisms. Likely combination of pleural effusions and COPD exacerbation we'll give steroids and for a course of small dose of Lasix. Will followup with his primary care  Dr or his pulmonologist.    Jasper Riling. Alvino Chapel, MD 06/25/13 6950

## 2013-06-24 NOTE — ED Notes (Signed)
Pt reports increased sob over past 2 days and difficulty sleeping due to not being able to breath well. Hx of collapsed lung and partial lobectomy in 2009 r/t cancer. Pt denies cp. Pt sts "feels like my lung is trying to go down again".

## 2013-06-24 NOTE — ED Notes (Signed)
Patient c/o worsening SOB over past 2 weeks sts last 2 days was significantly worse. Pt unable to sleep flat, propped up with pillows. Pt has increased SOB with movement and exertion. Pt in NAD. Alert and oriented, laying comfortably in bed

## 2013-06-30 ENCOUNTER — Ambulatory Visit (INDEPENDENT_AMBULATORY_CARE_PROVIDER_SITE_OTHER): Payer: Medicare Other | Admitting: Pulmonary Disease

## 2013-06-30 ENCOUNTER — Encounter: Payer: Self-pay | Admitting: Pulmonary Disease

## 2013-06-30 ENCOUNTER — Ambulatory Visit (INDEPENDENT_AMBULATORY_CARE_PROVIDER_SITE_OTHER)
Admission: RE | Admit: 2013-06-30 | Discharge: 2013-06-30 | Disposition: A | Discharge status: Home / Self Care | Payer: Medicare Other | Source: Ambulatory Visit | Attending: Pulmonary Disease | Admitting: Pulmonary Disease

## 2013-06-30 VITALS — BP 124/82 | HR 100 | Temp 97.1°F | Ht 73.0 in | Wt 217.0 lb

## 2013-06-30 DIAGNOSIS — J90 Pleural effusion, not elsewhere classified: Secondary | ICD-10-CM | POA: Insufficient documentation

## 2013-06-30 DIAGNOSIS — C3410 Malignant neoplasm of upper lobe, unspecified bronchus or lung: Secondary | ICD-10-CM

## 2013-06-30 DIAGNOSIS — C3411 Malignant neoplasm of upper lobe, right bronchus or lung: Secondary | ICD-10-CM | POA: Insufficient documentation

## 2013-06-30 DIAGNOSIS — J438 Other emphysema: Secondary | ICD-10-CM

## 2013-06-30 DIAGNOSIS — J439 Emphysema, unspecified: Secondary | ICD-10-CM

## 2013-06-30 HISTORY — DX: Malignant neoplasm of upper lobe, right bronchus or lung: C34.11

## 2013-06-30 NOTE — Patient Instructions (Signed)
Will try adding incruse to your symbicort to see if helps.  Take one inhalation each am in addition to the symbicort.  Call to give me an update on your response, and if you like, we can give you a prescription for the new medication.  Will check a chest xray today to see if the fluid is still present since you have been on fluid medication.  Will call you with results.  If your fluid has improved on fluid medication, will need to check with primary md or your cardiologist about staying on this medication. If doing well, followup with me in 59mos.

## 2013-06-30 NOTE — Assessment & Plan Note (Signed)
The patient has a right greater than left pleural effusion, and has been treated with 4 days of diuretic with some subjective improvement. He also gives a history of orthopnea leading up to his ER visit where the pleural effusion was discovered. I would like to repeat his chest x-ray today, and if the fluid has resolved, I suspect he is going to need some type of daily diuretic from his primary physician or cardiologist in order to treat his congestive heart failure. If the effusion remains significant, he will need a thoracentesis to see if transudate or exudate, and to make sure this is not related to his prior history of cancer. The patient is agreeable to this approach.

## 2013-06-30 NOTE — Progress Notes (Signed)
   Subjective:    Patient ID: Justin Garner, male    DOB: 22-Jun-1932, 80 y.o.   MRN: [id number]  HPI The patient comes in today for an acute sick visit. He has known COPD that is moderate in nature, and overall has done fairly well on Symbicort alone. He recently presented to the emergency room with a 3 to four-day history of classic orthopnea, and was concerned that he had had another pneumothorax. He had a chest x-ray that showed bilateral effusions, and subsequently had a CT chest that showed no acute PE. It did show a right greater than left pleural effusion with some minimal lymphadenopathy. The patient does have a cardiac history, and has had lower extremity edema. He denies any pleuritic chest pain, cough or congestion, but does have a history of right-sided lung cancer in the past.   Review of Systems  Constitutional: Negative for fever and unexpected weight change.  HENT: Positive for rhinorrhea and sinus pressure. Negative for congestion, dental problem, ear pain, nosebleeds, postnasal drip, sneezing, sore throat and trouble swallowing.   Eyes: Negative for redness and itching.  Respiratory: Positive for cough and shortness of breath. Negative for chest tightness and wheezing.   Cardiovascular: Negative for palpitations. Leg swelling: little.  Gastrointestinal: Negative for nausea and vomiting.  Genitourinary: Negative for dysuria.  Musculoskeletal: Negative for joint swelling.  Skin: Negative for rash.  Neurological: Positive for headaches.  Hematological: Does not bruise/bleed easily.  Psychiatric/Behavioral: Negative for dysphoric mood. The patient is not nervous/anxious.        Objective:   Physical Exam Well-developed male in no acute distress Nose without purulence or discharge noted Neck without lymphadenopathy or thyromegaly Chest with a few crackles in both bases, adequate airflow, no wheezing Cardiac exam with irregular rate and rhythm, but controlled ventricular  response Lower extremities with 2+ ankle edema, no cyanosis Alert and oriented, moves all 4 extremities.       Assessment & Plan:

## 2013-06-30 NOTE — Assessment & Plan Note (Signed)
The patient appears to be stable from a COPD standpoint, and I do not think this is the reason for his increased shortness of breath. He did not see an improvement with Spiriva, so we'll give him a trial of incruse.  He is to call me to give me an update to see if he has had a better response with this medication.

## 2013-07-01 ENCOUNTER — Telehealth: Payer: Self-pay | Admitting: Pulmonary Disease

## 2013-07-01 ENCOUNTER — Other Ambulatory Visit: Payer: Self-pay | Admitting: Pulmonary Disease

## 2013-07-01 DIAGNOSIS — J90 Pleural effusion, not elsewhere classified: Secondary | ICD-10-CM

## 2013-07-01 NOTE — Telephone Encounter (Signed)
Order sent to radiology

## 2013-07-01 NOTE — Telephone Encounter (Signed)
I have spoken with the pt about his chest xray results. He would like to go ahead with having fluid drawn off his lung. Per [facility] orders will need to be place: LDH, total protein, glucose, cell count with diff, routine culture and sensitivities and cytology. These orders have been placed.  Will route to [facility] so that he will be aware and so that he can place order for fluid removal.

## 2013-07-03 ENCOUNTER — Ambulatory Visit (HOSPITAL_COMMUNITY)
Admission: RE | Admit: 2013-07-03 | Discharge: 2013-07-03 | Disposition: A | Discharge status: Home / Self Care | Payer: Medicare Other | Source: Ambulatory Visit | Attending: Radiology | Admitting: Radiology

## 2013-07-03 ENCOUNTER — Ambulatory Visit (HOSPITAL_COMMUNITY)
Admission: RE | Admit: 2013-07-03 | Discharge: 2013-07-03 | Disposition: A | Discharge status: Home / Self Care | Payer: Medicare Other | Source: Ambulatory Visit | Attending: Pulmonary Disease | Admitting: Pulmonary Disease

## 2013-07-03 VITALS — BP 121/93

## 2013-07-03 DIAGNOSIS — J90 Pleural effusion, not elsewhere classified: Secondary | ICD-10-CM

## 2013-07-03 DIAGNOSIS — Z85118 Personal history of other malignant neoplasm of bronchus and lung: Secondary | ICD-10-CM | POA: Insufficient documentation

## 2013-07-03 LAB — BODY FLUID CELL COUNT WITH DIFFERENTIAL
EOS FL: 1 %
Lymphs, Fluid: 60 %
MONOCYTE-MACROPHAGE-SEROUS FLUID: 36 % — AB (ref 50–90)
Neutrophil Count, Fluid: 3 % (ref 0–25)
WBC FLUID: 744 uL (ref 0–1000)

## 2013-07-03 LAB — GLUCOSE, SEROUS FLUID: Glucose, Fluid: 181 mg/dL

## 2013-07-03 NOTE — Procedures (Signed)
US guided diagnostic/therapeutic right thoracentesis performed yielding 1.3 liters slightly turbid, amber fluid. The fluid was sent to the lab for preordered studies. F/u CXR pending. No immediate complications.

## 2013-07-04 ENCOUNTER — Ambulatory Visit (HOSPITAL_COMMUNITY): Admission: RE | Admit: 2013-07-04 | Payer: Medicare Other | Source: Ambulatory Visit

## 2013-07-06 LAB — BODY FLUID CULTURE: CULTURE: NO GROWTH

## 2013-07-07 ENCOUNTER — Telehealth: Payer: Self-pay | Admitting: Pulmonary Disease

## 2013-07-07 NOTE — Telephone Encounter (Signed)
Called and discussed results of thoracentesis with pt.  Negative cultures and cytology.  Unfortunately, LDH and total protein not sent despite being ordered.  I explained to pt that bilat effusions are CHF until proven otherwise.  He is still having issues with orthopnea per our phone call today.  I have asked him to call his primary md about this, and may need a daily diuretic.  If his fluid recurs rapidly, may have to re-access and get the fluid sent for LDH and TP.

## 2013-07-18 ENCOUNTER — Ambulatory Visit (HOSPITAL_COMMUNITY): Payer: Medicare Other | Attending: Internal Medicine | Admitting: Radiology

## 2013-07-18 ENCOUNTER — Telehealth: Payer: Self-pay | Admitting: Pulmonary Disease

## 2013-07-18 ENCOUNTER — Other Ambulatory Visit (HOSPITAL_COMMUNITY): Payer: Self-pay | Admitting: Internal Medicine

## 2013-07-18 DIAGNOSIS — I517 Cardiomegaly: Secondary | ICD-10-CM | POA: Insufficient documentation

## 2013-07-18 DIAGNOSIS — R0602 Shortness of breath: Secondary | ICD-10-CM

## 2013-07-18 DIAGNOSIS — R0989 Other specified symptoms and signs involving the circulatory and respiratory systems: Principal | ICD-10-CM | POA: Insufficient documentation

## 2013-07-18 DIAGNOSIS — R0609 Other forms of dyspnea: Secondary | ICD-10-CM | POA: Insufficient documentation

## 2013-07-18 DIAGNOSIS — R079 Chest pain, unspecified: Secondary | ICD-10-CM | POA: Insufficient documentation

## 2013-07-18 DIAGNOSIS — Z87891 Personal history of nicotine dependence: Secondary | ICD-10-CM | POA: Insufficient documentation

## 2013-07-18 DIAGNOSIS — E119 Type 2 diabetes mellitus without complications: Secondary | ICD-10-CM | POA: Insufficient documentation

## 2013-07-18 DIAGNOSIS — Z86711 Personal history of pulmonary embolism: Secondary | ICD-10-CM | POA: Insufficient documentation

## 2013-07-18 DIAGNOSIS — Z85118 Personal history of other malignant neoplasm of bronchus and lung: Secondary | ICD-10-CM | POA: Insufficient documentation

## 2013-07-18 DIAGNOSIS — J4489 Other specified chronic obstructive pulmonary disease: Secondary | ICD-10-CM | POA: Insufficient documentation

## 2013-07-18 DIAGNOSIS — I4891 Unspecified atrial fibrillation: Secondary | ICD-10-CM | POA: Insufficient documentation

## 2013-07-18 DIAGNOSIS — J449 Chronic obstructive pulmonary disease, unspecified: Secondary | ICD-10-CM | POA: Insufficient documentation

## 2013-07-18 DIAGNOSIS — I10 Essential (primary) hypertension: Secondary | ICD-10-CM | POA: Insufficient documentation

## 2013-07-18 DIAGNOSIS — E785 Hyperlipidemia, unspecified: Secondary | ICD-10-CM | POA: Insufficient documentation

## 2013-07-18 MED ORDER — UMECLIDINIUM BROMIDE 62.5 MCG/INH IN AEPB: 1.0000 | INHALATION_SPRAY | Freq: Every day | RESPIRATORY_TRACT | Status: DC

## 2013-07-18 NOTE — Telephone Encounter (Signed)
I called spoke with pt. He is calling for samples of incruse. He reports [facility] advised him if he needed samples to call since we had plenty. No RX has been sent to the pharm. Please advise [place] thanks

## 2013-07-18 NOTE — Telephone Encounter (Signed)
That is not what I said.  See AVS.  I said that if his insurance didn't cover or if he could not afford, that we could give him samples until he could apply for pt assistance program.   What I would like to do is give him 2 boxes of incruse, send in script for him, and if he feels can't afford, fill out paperwork for pt assistance program.

## 2013-07-18 NOTE — Progress Notes (Signed)
Echocardiogram performed.  

## 2013-07-18 NOTE — Telephone Encounter (Signed)
Spoke with pt. Samples left for pick up and RX sent. He will call back if this is too expensive for Patient Assistance

## 2013-07-22 ENCOUNTER — Ambulatory Visit (INDEPENDENT_AMBULATORY_CARE_PROVIDER_SITE_OTHER): Payer: Medicare Other | Admitting: Pharmacist Clinician (PhC)/ Clinical Pharmacy Specialist

## 2013-07-22 DIAGNOSIS — I2699 Other pulmonary embolism without acute cor pulmonale: Secondary | ICD-10-CM

## 2013-07-22 DIAGNOSIS — Z5181 Encounter for therapeutic drug level monitoring: Secondary | ICD-10-CM

## 2013-07-22 DIAGNOSIS — I4891 Unspecified atrial fibrillation: Secondary | ICD-10-CM

## 2013-07-22 LAB — POCT INR: INR: 2.7

## 2013-08-12 ENCOUNTER — Encounter: Payer: Self-pay | Admitting: Interventional Cardiology

## 2013-08-12 ENCOUNTER — Ambulatory Visit (INDEPENDENT_AMBULATORY_CARE_PROVIDER_SITE_OTHER): Payer: Medicare Other | Admitting: *Deleted

## 2013-08-12 ENCOUNTER — Encounter (HOSPITAL_COMMUNITY): Payer: Self-pay | Admitting: Pharmacy Technician

## 2013-08-12 ENCOUNTER — Ambulatory Visit (INDEPENDENT_AMBULATORY_CARE_PROVIDER_SITE_OTHER): Payer: Medicare Other | Admitting: Interventional Cardiology

## 2013-08-12 ENCOUNTER — Encounter: Payer: Self-pay | Admitting: Cardiology

## 2013-08-12 VITALS — BP 126/86 | HR 74 | Ht 73.0 in | Wt 203.0 lb

## 2013-08-12 DIAGNOSIS — Z5181 Encounter for therapeutic drug level monitoring: Secondary | ICD-10-CM

## 2013-08-12 DIAGNOSIS — I2699 Other pulmonary embolism without acute cor pulmonale: Secondary | ICD-10-CM

## 2013-08-12 DIAGNOSIS — I4891 Unspecified atrial fibrillation: Secondary | ICD-10-CM

## 2013-08-12 DIAGNOSIS — I10 Essential (primary) hypertension: Secondary | ICD-10-CM

## 2013-08-12 DIAGNOSIS — I2510 Atherosclerotic heart disease of native coronary artery without angina pectoris: Secondary | ICD-10-CM

## 2013-08-12 DIAGNOSIS — I5021 Acute systolic (congestive) heart failure: Secondary | ICD-10-CM

## 2013-08-12 LAB — BASIC METABOLIC PANEL
BUN: 22 mg/dL (ref 6–23)
CO2: 29 meq/L (ref 19–32)
Calcium: 9.2 mg/dL (ref 8.4–10.5)
Chloride: 104 mEq/L (ref 96–112)
Creatinine, Ser: 1.6 mg/dL — ABNORMAL HIGH (ref 0.4–1.5)
GFR: 43.57 mL/min — ABNORMAL LOW (ref >60.00)
Glucose, Bld: 173 mg/dL — ABNORMAL HIGH (ref 70–99)
POTASSIUM: 3.5 meq/L (ref 3.5–5.1)
Sodium: 141 mEq/L (ref 135–145)

## 2013-08-12 LAB — CBC WITH DIFFERENTIAL/PLATELET
BASOS PCT: 0.4 % (ref 0.0–3.0)
Basophils Absolute: 0 10*3/uL (ref 0.0–0.1)
EOS PCT: 1.7 % (ref 0.0–5.0)
Eosinophils Absolute: 0.2 10*3/uL (ref 0.0–0.7)
HCT: 50.8 % (ref 39.0–52.0)
Hemoglobin: 16.9 g/dL (ref 13.0–17.0)
Lymphocytes Relative: 16.7 % (ref 12.0–46.0)
Lymphs Abs: 1.7 10*3/uL (ref 0.7–4.0)
MCHC: 33.3 g/dL (ref 30.0–36.0)
MCV: 91.7 fl (ref 78.0–100.0)
MONO ABS: 0.9 10*3/uL (ref 0.1–1.0)
Monocytes Relative: 8.9 % (ref 3.0–12.0)
Neutro Abs: 7.2 10*3/uL (ref 1.4–7.7)
Neutrophils Relative %: 72.3 % (ref 43.0–77.0)
Platelets: 217 10*3/uL (ref 150.0–400.0)
RBC: 5.54 Mil/uL (ref 4.22–5.81)
RDW: 15.8 % — ABNORMAL HIGH (ref 11.5–15.5)
WBC: 9.9 10*3/uL (ref 4.0–10.5)

## 2013-08-12 LAB — PROTIME-INR
INR: 1.8 ratio — ABNORMAL HIGH (ref 0.8–1.0)
Prothrombin Time: 20.2 s — ABNORMAL HIGH (ref 9.6–13.1)

## 2013-08-12 LAB — POCT INR: INR: 1.8

## 2013-08-12 NOTE — Patient Instructions (Signed)
Your physician recommends that you return for lab work today for bmet, cbc with diff and pt/inr.  Your physician has requested that you have a cardiac catheterization. Cardiac catheterization is used to diagnose and/or treat various heart conditions. Doctors may recommend this procedure for a number of different reasons. The most common reason is to evaluate chest pain. Chest pain can be a symptom of coronary artery disease (CAD), and cardiac catheterization can show whether plaque is narrowing or blocking your heart's arteries. This procedure is also used to evaluate the valves, as well as measure the blood flow and oxygen levels in different parts of your heart. For further information please visit [email or web address]. Please follow instruction sheet, as given.

## 2013-08-12 NOTE — Progress Notes (Signed)
Patient ID: Justin Garner, male   DOB: 1932/12/01, 80 y.o.   MRN: [id number] Patient ID: Justin Garner, male   DOB: Mar 28, 1932, 80 y.o.   MRN: [id number]    [place] [place], [place]  [address] Phone: [phone] Fax:  [phone]  Date:  08/12/2013   ID:  Justin Garner, DOB 18-Aug-1932, MRN [id number]  PCP:  Justin Hams, MD      History of Present Illness: Justin Garner is a 80 y.o. male who has CAD, AFib. Atrial Fibrillation F/U:  PTX in Dec 2012, treated with chest tube. c/o Shortness of breath at patient's baseline.  Denies : Chest pain.  Dizziness.  Leg edema.  Orthopnea.  Palpitations.  Syncope.   Was walking more in good weather.  About a month ago, he had significant orthopnea. He was found to have a pleural effusion. He underwent pleurocentesis and had significant improvement. Echocardiogram was done which showed a decrease in his left jugular function. Previously, he had no wall motion abnormalities. On the more recent study, his EF was down to 40-45% with an inferior wall motion abnormality. He continues to deny angina, but does have shortness of breath with exertion. Back in 2003 when he had stents placed, he did not have chest pressure at that time.    Wt Readings from Last 3 Encounters:  08/12/13 203 lb (92.08 kg)  06/30/13 217 lb (98.431 kg)  06/24/13 210 lb (95.255 kg)     Past Medical History  Diagnosis Date  . Diabetes mellitus   . Thyroid disease   . Coronary artery disease   . COPD (chronic obstructive pulmonary disease)   . GERD (gastroesophageal reflux disease)   . Pulmonary embolism   . Hyperlipidemia   . FH: colonic polyps   . PAF (paroxysmal atrial fibrillation)   . Hypertension   . ASCVD (arteriosclerotic cardiovascular disease)   . Cancer     Current Outpatient Prescriptions  Medication Sig Dispense Refill  . atorvastatin (LIPITOR) 20 MG tablet Take 20 mg by mouth every morning.      .  budesonide-formoterol (SYMBICORT) 160-4.5 MCG/ACT inhaler Inhale 2 puffs into the lungs 2 (two) times daily.       Marland Kitchen diltiazem (CARDIZEM CD) 240 MG 24 hr capsule Take 240 mg by mouth daily.        . fenofibrate micronized (LOFIBRA) 67 MG capsule Take 1 capsule (67 mg total) by mouth daily before breakfast.  90 capsule  2  . fish oil-omega-3 fatty acids 1000 MG capsule Take 1 g by mouth 2 (two) times daily.       . furosemide (LASIX) 20 MG tablet       . glipiZIDE (GLUCOTROL XL) 10 MG 24 hr tablet Take 10 mg by mouth daily with breakfast.       . JANUVIA 50 MG tablet Take 50 mg by mouth every morning.       Marland Kitchen levothyroxine (SYNTHROID, LEVOTHROID) 112 MCG tablet Take 112 mcg by mouth daily.        . nitroGLYCERIN (NITROSTAT) 0.4 MG SL tablet Place 1 tablet (0.4 mg total) under the tongue every 5 (five) minutes as needed for chest pain.  25 tablet  5  . omeprazole (PRILOSEC) 20 MG capsule Take 20 mg by mouth daily.        Marland Kitchen warfarin (COUMADIN) 5 MG tablet Take 5-7.5 mg by mouth daily at 6 PM. Takes 7.5mg  on Mon, Wed and Fri Takes  5mg  all other days       No current facility-administered medications for this visit.    Allergies:   No Known Allergies  Social History:  The patient  reports that he quit smoking about 12 years ago. His smoking use included Cigarettes. He has a 50 pack-year smoking history. He has quit using smokeless tobacco. He reports that he does not drink alcohol or use illicit drugs.   Family History:  The patient's family history includes Heart disease in his father.   ROS:  Please see the history of present illness.  No nausea, vomiting.  No fevers, chills.  No focal weakness.  No dysuria. Fatigue;   All other systems reviewed and negative.   PHYSICAL EXAM: VS:  BP 126/86  Pulse 74  Ht 6\' 1"  (1.854 m)  Wt 203 lb (92.08 kg)  BMI 26.79 kg/m2 Well nourished, well developed, in no acute distress HEENT: normal Neck: no JVD, no carotid bruits Cardiac:  normal S1,  S2;irregularly, irregular Lungs:  clear to auscultation bilaterally, no wheezing, rhonchi or rales Abd: soft, nontender, no hepatomegaly Ext: no edema Skin: warm and dry Neuro:   no focal abnormalities noted  EKG:  AFib, rate controlled. NSST     2008: negative cardiolite 10/14 lipids TC 148, LDL 86; HDL 41  ASSESSMENT AND PLAN:  Atrial fibrillation  Continue Diltiazem HCl Capsule Extended Release 24 Hour, 240 MG, 1 capsule, Orally, Once a day IMAGING: EKG   Corson,Danielle 05/28/2012 11:06:06 AM > Brek Reece,JAY 05/28/2012 11:22:32 AM > AFib, rate controlled.   Notes: Rate controlled. COumadin for stroke prevention.  2. Coronary atherosclerosis of native coronary artery  Stop Aspirin Tablet, 81 MG, 1 tablet, Orally, Once a day; RF NTG Continue Fenofibrate Micronized Capsule, 67 MG, 1 capsule, Orally, Once a day Notes: stents in LAD and circumflex in 2003. No angina, but recent heart failure. Plan for left heart catheterization. The risks and benefits were explained to the patient and his wife. They're willing to proceed. LDL 80.  3. Anticoagulant monitoring  Continue Warfarin Sodium Tablet, 5 MG, per pharmD, Orally, 5 mg qd except 7.5 mg M/W Notes: INR to be checked and coumadin dose to be adjusted as needed.  Well controlled of late.  he'll need to be off of his Coumadin for the procedure.  4. Essential hypertension, benign  Notes: Check BP at home. If BP is consistently more than 150/90, let us know and we can adjust medicatons.  Continue to check at home.  Controlled today. 5.  Acute systolic heart failure: ? CAD as cause.  PLan for cath.   Signed, Mina Marble, MD, [facility] 08/12/2013 9:55 AM

## 2013-08-12 NOTE — Patient Instructions (Signed)
08/15/2013 Last day to take coumadin  08/16/2013 no coumadin 08/17/2013 no coumadin 08/18/2013 no coumadin 08/19/2013 no coumadin day of procedure then after procedure when instructed by MD doing procedure to restart coumadin restart at same dose and continue this dose until rechecked in coumadin clinic on June 9th

## 2013-08-19 ENCOUNTER — Encounter (HOSPITAL_COMMUNITY)
Admission: RE | Disposition: A | Discharge status: Home / Self Care | Payer: Self-pay | Source: Ambulatory Visit | Attending: Interventional Cardiology

## 2013-08-19 ENCOUNTER — Ambulatory Visit (HOSPITAL_COMMUNITY)
Admission: RE | Admit: 2013-08-19 | Discharge: 2013-08-19 | Disposition: A | Discharge status: Home / Self Care | Payer: Medicare Other | Source: Ambulatory Visit | Attending: Interventional Cardiology | Admitting: Interventional Cardiology

## 2013-08-19 DIAGNOSIS — J90 Pleural effusion, not elsewhere classified: Secondary | ICD-10-CM | POA: Insufficient documentation

## 2013-08-19 DIAGNOSIS — I5021 Acute systolic (congestive) heart failure: Secondary | ICD-10-CM | POA: Insufficient documentation

## 2013-08-19 DIAGNOSIS — I4891 Unspecified atrial fibrillation: Secondary | ICD-10-CM | POA: Insufficient documentation

## 2013-08-19 DIAGNOSIS — I10 Essential (primary) hypertension: Secondary | ICD-10-CM | POA: Insufficient documentation

## 2013-08-19 DIAGNOSIS — I2510 Atherosclerotic heart disease of native coronary artery without angina pectoris: Secondary | ICD-10-CM

## 2013-08-19 DIAGNOSIS — Z86711 Personal history of pulmonary embolism: Secondary | ICD-10-CM | POA: Insufficient documentation

## 2013-08-19 DIAGNOSIS — Z87891 Personal history of nicotine dependence: Secondary | ICD-10-CM | POA: Insufficient documentation

## 2013-08-19 DIAGNOSIS — K219 Gastro-esophageal reflux disease without esophagitis: Secondary | ICD-10-CM | POA: Insufficient documentation

## 2013-08-19 DIAGNOSIS — J4489 Other specified chronic obstructive pulmonary disease: Secondary | ICD-10-CM | POA: Insufficient documentation

## 2013-08-19 DIAGNOSIS — Z7901 Long term (current) use of anticoagulants: Secondary | ICD-10-CM | POA: Insufficient documentation

## 2013-08-19 DIAGNOSIS — J449 Chronic obstructive pulmonary disease, unspecified: Secondary | ICD-10-CM | POA: Insufficient documentation

## 2013-08-19 DIAGNOSIS — E119 Type 2 diabetes mellitus without complications: Secondary | ICD-10-CM | POA: Insufficient documentation

## 2013-08-19 DIAGNOSIS — E785 Hyperlipidemia, unspecified: Secondary | ICD-10-CM | POA: Insufficient documentation

## 2013-08-19 HISTORY — PX: LEFT HEART CATHETERIZATION WITH CORONARY ANGIOGRAM: SHX5451

## 2013-08-19 LAB — BASIC METABOLIC PANEL
BUN: 22 mg/dL (ref 6–23)
CO2: 27 meq/L (ref 19–32)
Calcium: 9.5 mg/dL (ref 8.4–10.5)
Chloride: 102 mEq/L (ref 96–112)
Creatinine, Ser: 1.38 mg/dL — ABNORMAL HIGH (ref 0.50–1.35)
GFR calc Af Amer: 55 mL/min — ABNORMAL LOW (ref >90)
GFR, EST NON AFRICAN AMERICAN: 47 mL/min — AB (ref >90)
GLUCOSE: 148 mg/dL — AB (ref 70–99)
POTASSIUM: 3.8 meq/L (ref 3.7–5.3)
SODIUM: 141 meq/L (ref 137–147)

## 2013-08-19 LAB — POCT ACTIVATED CLOTTING TIME: ACTIVATED CLOTTING TIME: 243 s

## 2013-08-19 LAB — GLUCOSE, CAPILLARY
Glucose-Capillary: 150 mg/dL — ABNORMAL HIGH (ref 70–99)
Glucose-Capillary: 146 mg/dL — ABNORMAL HIGH (ref 70–99)

## 2013-08-19 LAB — PROTIME-INR
INR: 1.31 (ref 0.00–1.49)
Prothrombin Time: 16 seconds — ABNORMAL HIGH (ref 11.6–15.2)

## 2013-08-19 SURGERY — LEFT HEART CATHETERIZATION WITH CORONARY ANGIOGRAM
Anesthesia: LOCAL

## 2013-08-19 MED ORDER — SODIUM CHLORIDE 0.9 % IV SOLN: 1.0000 mL/kg/h | INTRAVENOUS | Status: DC

## 2013-08-19 MED ORDER — SODIUM CHLORIDE 0.9 % IJ SOLN: 3.0000 mL | Freq: Two times a day (BID) | INTRAMUSCULAR | Status: DC

## 2013-08-19 MED ORDER — FENTANYL CITRATE 0.05 MG/ML IJ SOLN
INTRAMUSCULAR | Status: AC
  Filled 2013-08-19: qty 2

## 2013-08-19 MED ORDER — ADENOSINE 12 MG/4ML IV SOLN
16.0000 mL | INTRAVENOUS | Status: AC
  Administered 2013-08-19: 48 mg via INTRAVENOUS
  Filled 2013-08-19: qty 16

## 2013-08-19 MED ORDER — SODIUM CHLORIDE 0.9 % IV SOLN
INTRAVENOUS | Status: DC
  Administered 2013-08-19: 08:00:00 via INTRAVENOUS

## 2013-08-19 MED ORDER — SODIUM CHLORIDE 0.9 % IV SOLN: 250.0000 mL | INTRAVENOUS | Status: DC | PRN

## 2013-08-19 MED ORDER — HEPARIN (PORCINE) IN NACL 2-0.9 UNIT/ML-% IJ SOLN
INTRAMUSCULAR | Status: AC
  Filled 2013-08-19: qty 1000

## 2013-08-19 MED ORDER — VERAPAMIL HCL 2.5 MG/ML IV SOLN
INTRAVENOUS | Status: AC
  Filled 2013-08-19: qty 2

## 2013-08-19 MED ORDER — ASPIRIN 81 MG PO CHEW
81.0000 mg | CHEWABLE_TABLET | ORAL | Status: AC
  Administered 2013-08-19: 81 mg via ORAL
  Filled 2013-08-19: qty 1

## 2013-08-19 MED ORDER — LIDOCAINE HCL (PF) 1 % IJ SOLN
INTRAMUSCULAR | Status: AC
  Filled 2013-08-19: qty 30

## 2013-08-19 MED ORDER — MIDAZOLAM HCL 2 MG/2ML IJ SOLN
INTRAMUSCULAR | Status: AC
  Filled 2013-08-19: qty 2

## 2013-08-19 MED ORDER — SODIUM CHLORIDE 0.9 % IJ SOLN: 3.0000 mL | INTRAMUSCULAR | Status: DC | PRN

## 2013-08-19 MED ORDER — HEPARIN SODIUM (PORCINE) 1000 UNIT/ML IJ SOLN
INTRAMUSCULAR | Status: AC
  Filled 2013-08-19: qty 1

## 2013-08-19 NOTE — CV Procedure (Signed)
       PROCEDURE:  Left heart catheterization with selective coronary angiography, left ventriculogram.  FFR RCA  INDICATIONS:  Decreased EF by echo with pleural effusions  The risks, benefits, and details of the procedure were explained to the patient.  The patient verbalized understanding and wanted to proceed.  Informed written consent was obtained.  PROCEDURE TECHNIQUE:  After Xylocaine anesthesia a 23F slender sheath was placed in the right radial artery with a single anterior needle wall stick.   Right coronary angiography was done using a Judkins R4 guide catheter.  Left coronary angiography was done using a Judkins L3.5 guide catheter.  Left ventriculography was done using a pigtail catheter.  A TR band was used for hemostasis.   CONTRAST:  Total of 125 cc.  COMPLICATIONS:  None.    HEMODYNAMICS:  Aortic pressure was 125/67; LV pressure was 127/4; LVEDP 8.  There was no gradient between the left ventricle and aorta.    ANGIOGRAPHIC DATA:   The left main coronary artery is mildly diseased distally.  The left anterior descending artery is a large vessel. There is diffuse ectasia in the proximal to mid vessel. There is a large first diagonal which is widely patent. There is mild atherosclerosis in the mid LAD. The second diagonal is medium-sized and patent.  The left circumflex artery is a large vessel. There is mild, diffuse atherosclerosis proximally. There is mild ectasia in the mid vessel. There is a large branching first obtuse marginal which is patent. The remainder of the circumflex is patent.  The right coronary artery is a large dominant vessel. Proximally, there is an eccentric 40-50% stenosis. There is ectasia in the mid vessel. There is mild atherosclerosis throughout the RCA system. The posterior lateral artery is large and widely patent. The posterior descending artery is also large and widely patent.  LEFT VENTRICULOGRAM:  Left ventricular angiogram was done in the 30  RAO projection and revealed normal left ventricular wall motion and systolic function with an estimated ejection fraction of 50 %.  LVEDP was 9 mmHg.  IMPRESSIONS:  1. Widely patent left main coronary artery. 2. Mild diffuse disease in the left anterior descending artery and its branches. 3. Mild diffuse disease in the left circumflex artery and its branches. 4. Mild diffuse disease in the right coronary artery system.  There is a moderate proximal lesion which was not significant by FFR. 5. Normal left ventricular systolic function.  LVEDP 9 mmHg.  Ejection fraction 50 %.  RECOMMENDATION:  Continue medical therapy. It does not appear that the pleural effusion he had about 6 weeks ago was related to systolic dysfunction. His LVEDP is normal. No evidence of fluid overload. We'll resume Coumadin tonight. Recheck INR on Friday.

## 2013-08-19 NOTE — Interval H&P Note (Signed)
Cath Lab Visit (complete for each Cath Lab visit)  Clinical Evaluation Leading to the Procedure:   ACS: no  Non-ACS:    Anginal Classification: CCS III  Anti-ischemic medical therapy: Minimal Therapy (1 class of medications)  Non-Invasive Test Results: Intermediate-risk stress test findings: cardiac mortality 1-3%/year  Prior CABG: No previous CABG  EF decreased.  Anginal equivalent is [facility] which has gotten worse.       History and Physical Interval Note:  08/19/2013 11:29 AM  Justin Garner  has presented today for surgery, with the diagnosis of systolic chf  The various methods of treatment have been discussed with the patient and family. After consideration of risks, benefits and other options for treatment, the patient has consented to  Procedure(s): LEFT HEART CATHETERIZATION WITH CORONARY ANGIOGRAM (N/A) as a surgical intervention .  The patient's history has been reviewed, patient examined, no change in status, stable for surgery.  I have reviewed the patient's chart and labs.  Questions were answered to the patient's satisfaction.     Jettie Booze

## 2013-08-19 NOTE — H&P (View-Only) (Signed)
Patient ID: Justin Garner, male   DOB: 03/25/1932, 80 y.o.   MRN: [id number] Patient ID: Justin Garner, male   DOB: 1932/04/03, 80 y.o.   MRN: [id number]    [place], [place], [place]  [address] Phone: [phone] Fax:  [phone]  Date:  08/12/2013   ID:  Justin Garner, DOB 03/10/1933, MRN [id number]  PCP:  Justin Hams, MD      History of Present Illness: Justin Garner is a 80 y.o. male who has CAD, AFib. Atrial Fibrillation F/U:  PTX in Dec 2012, treated with chest tube. c/o Shortness of breath at patient's baseline.  Denies : Chest pain.  Dizziness.  Leg edema.  Orthopnea.  Palpitations.  Syncope.   Was walking more in good weather.  About a month ago, he had significant orthopnea. He was found to have a pleural effusion. He underwent pleurocentesis and had significant improvement. Echocardiogram was done which showed a decrease in his left jugular function. Previously, he had no wall motion abnormalities. On the more recent study, his EF was down to 40-45% with an inferior wall motion abnormality. He continues to deny angina, but does have shortness of breath with exertion. Back in 2003 when he had stents placed, he did not have chest pressure at that time.    Wt Readings from Last 3 Encounters:  08/12/13 203 lb (92.08 kg)  06/30/13 217 lb (98.431 kg)  06/24/13 210 lb (95.255 kg)     Past Medical History  Diagnosis Date  . Diabetes mellitus   . Thyroid disease   . Coronary artery disease   . COPD (chronic obstructive pulmonary disease)   . GERD (gastroesophageal reflux disease)   . Pulmonary embolism   . Hyperlipidemia   . FH: colonic polyps   . PAF (paroxysmal atrial fibrillation)   . Hypertension   . ASCVD (arteriosclerotic cardiovascular disease)   . Cancer     Current Outpatient Prescriptions  Medication Sig Dispense Refill  . atorvastatin (LIPITOR) 20 MG tablet Take 20 mg by mouth every morning.      .  budesonide-formoterol (SYMBICORT) 160-4.5 MCG/ACT inhaler Inhale 2 puffs into the lungs 2 (two) times daily.       Marland Kitchen diltiazem (CARDIZEM CD) 240 MG 24 hr capsule Take 240 mg by mouth daily.        . fenofibrate micronized (LOFIBRA) 67 MG capsule Take 1 capsule (67 mg total) by mouth daily before breakfast.  90 capsule  2  . fish oil-omega-3 fatty acids 1000 MG capsule Take 1 g by mouth 2 (two) times daily.       . furosemide (LASIX) 20 MG tablet       . glipiZIDE (GLUCOTROL XL) 10 MG 24 hr tablet Take 10 mg by mouth daily with breakfast.       . JANUVIA 50 MG tablet Take 50 mg by mouth every morning.       Marland Kitchen levothyroxine (SYNTHROID, LEVOTHROID) 112 MCG tablet Take 112 mcg by mouth daily.        . nitroGLYCERIN (NITROSTAT) 0.4 MG SL tablet Place 1 tablet (0.4 mg total) under the tongue every 5 (five) minutes as needed for chest pain.  25 tablet  5  . omeprazole (PRILOSEC) 20 MG capsule Take 20 mg by mouth daily.        Marland Kitchen warfarin (COUMADIN) 5 MG tablet Take 5-7.5 mg by mouth daily at 6 PM. Takes 7.5mg  on Mon, Wed and Fri Takes  5mg  all other days       No current facility-administered medications for this visit.    Allergies:   No Known Allergies  Social History:  The patient  reports that he quit smoking about 12 years ago. His smoking use included Cigarettes. He has a 50 pack-year smoking history. He has quit using smokeless tobacco. He reports that he does not drink alcohol or use illicit drugs.   Family History:  The patient's family history includes Heart disease in his father.   ROS:  Please see the history of present illness.  No nausea, vomiting.  No fevers, chills.  No focal weakness.  No dysuria. Fatigue;   All other systems reviewed and negative.   PHYSICAL EXAM: VS:  BP 126/86  Pulse 74  Ht 6\' 1"  (1.854 m)  Wt 203 lb (92.08 kg)  BMI 26.79 kg/m2 Well nourished, well developed, in no acute distress HEENT: normal Neck: no JVD, no carotid bruits Cardiac:  normal S1,  S2;irregularly, irregular Lungs:  clear to auscultation bilaterally, no wheezing, rhonchi or rales Abd: soft, nontender, no hepatomegaly Ext: no edema Skin: warm and dry Neuro:   no focal abnormalities noted  EKG:  AFib, rate controlled. NSST     2008: negative cardiolite 10/14 lipids TC 148, LDL 86; HDL 41  ASSESSMENT AND PLAN:  Atrial fibrillation  Continue Diltiazem HCl Capsule Extended Release 24 Hour, 240 MG, 1 capsule, Orally, Once a day IMAGING: EKG   Corson,Danielle 05/28/2012 11:06:06 AM > Emmalena Canny,JAY 05/28/2012 11:22:32 AM > AFib, rate controlled.   Notes: Rate controlled. COumadin for stroke prevention.  2. Coronary atherosclerosis of native coronary artery  Stop Aspirin Tablet, 81 MG, 1 tablet, Orally, Once a day; RF NTG Continue Fenofibrate Micronized Capsule, 67 MG, 1 capsule, Orally, Once a day Notes: stents in LAD and circumflex in 2003. No angina, but recent heart failure. Plan for left heart catheterization. The risks and benefits were explained to the patient and his wife. They're willing to proceed. LDL 80.  3. Anticoagulant monitoring  Continue Warfarin Sodium Tablet, 5 MG, per pharmD, Orally, 5 mg qd except 7.5 mg M/W Notes: INR to be checked and coumadin dose to be adjusted as needed.  Well controlled of late.  he'll need to be off of his Coumadin for the procedure.  4. Essential hypertension, benign  Notes: Check BP at home. If BP is consistently more than 150/90, let us know and we can adjust medicatons.  Continue to check at home.  Controlled today. 5.  Acute systolic heart failure: ? CAD as cause.  PLan for cath.   Signed, Mina Marble, MD, [facility] 08/12/2013 9:55 AM

## 2013-08-19 NOTE — Discharge Instructions (Signed)
Follow post radial cath instructions. Radial Site Care Refer to this sheet in the next few weeks. These instructions provide you with information on caring for yourself after your procedure. Your caregiver may also give you more specific instructions. Your treatment has been planned according to current medical practices, but problems sometimes occur. Call your caregiver if you have any problems or questions after your procedure. HOME CARE INSTRUCTIONS  You may shower the day after the procedure.Remove the bandage (dressing) and gently wash the site with plain soap and water.Gently pat the site dry.  Do not apply powder or lotion to the site.  Do not submerge the affected site in water for 3 to 5 days.  Inspect the site at least twice daily.  Do not flex or bend the affected arm for 24 hours.  No lifting over 5 pounds (2.3 kg) for 5 days after your procedure.  Do not drive home if you are discharged the same day of the procedure. Have someone else drive you.  You may drive 24 hours after the procedure unless otherwise instructed by your caregiver.  Do not operate machinery or power tools for 24 hours.  A responsible adult should be with you for the first 24 hours after you arrive home. What to expect:  Any bruising will usually fade within 1 to 2 weeks.  Blood that collects in the tissue (hematoma) may be painful to the touch. It should usually decrease in size and tenderness within 1 to 2 weeks. SEEK IMMEDIATE MEDICAL CARE IF:  You have unusual pain at the radial site.  You have redness, warmth, swelling, or pain at the radial site.  You have drainage (other than a small amount of blood on the dressing).  You have chills.  You have a fever or persistent symptoms for more than 72 hours.  You have a fever and your symptoms suddenly get worse.  Your arm becomes pale, cool, tingly, or numb.  You have heavy bleeding from the site. Hold pressure on the site. Document  Released: 04/08/2010 Document Revised: 05/29/2011 Document Reviewed: 04/08/2010 [facility] Patient Information 2014 [place], [place].

## 2013-08-26 ENCOUNTER — Ambulatory Visit (INDEPENDENT_AMBULATORY_CARE_PROVIDER_SITE_OTHER): Payer: Medicare Other | Admitting: Pharmacist Clinician (PhC)/ Clinical Pharmacy Specialist

## 2013-08-26 DIAGNOSIS — I2699 Other pulmonary embolism without acute cor pulmonale: Secondary | ICD-10-CM

## 2013-08-26 DIAGNOSIS — Z5181 Encounter for therapeutic drug level monitoring: Secondary | ICD-10-CM

## 2013-08-26 DIAGNOSIS — I4891 Unspecified atrial fibrillation: Secondary | ICD-10-CM

## 2013-08-26 LAB — POCT INR: INR: 1.8

## 2013-09-09 ENCOUNTER — Ambulatory Visit (INDEPENDENT_AMBULATORY_CARE_PROVIDER_SITE_OTHER): Payer: Medicare Other | Admitting: *Deleted

## 2013-09-09 DIAGNOSIS — I4891 Unspecified atrial fibrillation: Secondary | ICD-10-CM

## 2013-09-09 DIAGNOSIS — Z5181 Encounter for therapeutic drug level monitoring: Secondary | ICD-10-CM

## 2013-09-09 DIAGNOSIS — I2699 Other pulmonary embolism without acute cor pulmonale: Secondary | ICD-10-CM

## 2013-09-09 LAB — POCT INR: INR: 2.2

## 2013-09-16 ENCOUNTER — Ambulatory Visit: Payer: Medicare Other | Admitting: Interventional Cardiology

## 2013-09-30 ENCOUNTER — Ambulatory Visit (INDEPENDENT_AMBULATORY_CARE_PROVIDER_SITE_OTHER): Payer: Medicare Other | Admitting: *Deleted

## 2013-09-30 DIAGNOSIS — I4891 Unspecified atrial fibrillation: Secondary | ICD-10-CM

## 2013-09-30 DIAGNOSIS — I2699 Other pulmonary embolism without acute cor pulmonale: Secondary | ICD-10-CM

## 2013-09-30 DIAGNOSIS — Z5181 Encounter for therapeutic drug level monitoring: Secondary | ICD-10-CM

## 2013-09-30 LAB — POCT INR: INR: 2.9

## 2013-10-07 ENCOUNTER — Telehealth: Payer: Self-pay | Admitting: Cardiology

## 2013-10-07 MED ORDER — DILTIAZEM HCL ER COATED BEADS 240 MG PO CP24: 240.0000 mg | ORAL_CAPSULE | Freq: Every day | ORAL | Status: DC

## 2013-10-07 NOTE — Telephone Encounter (Signed)
Pt never had post cath f/u. Would you like for me to schedule or call and see how pt is doing?

## 2013-10-09 NOTE — Telephone Encounter (Signed)
Ok to check and see how he is doing.  F/u with me in 3 months if doing ok.

## 2013-10-10 NOTE — Telephone Encounter (Signed)
lmtrc

## 2013-10-10 NOTE — Telephone Encounter (Signed)
Spoke with pt and he states he feels the same as he did before heart cath. Since the heart cath nothing has really changed. He still has fatigue and some exertional SOB. Pt denies CP. I told pt I can go ahead and get in him to be seen instead of waiting three months, but pt wanted to me to make him am appt for October. Appt made. Pt states he will call if symptoms worsen.

## 2013-10-13 NOTE — Telephone Encounter (Signed)
No further cardiac w/u at this point. SHould f/u with PMD since sx have not improved.

## 2013-10-14 NOTE — Telephone Encounter (Signed)
Pt.notified

## 2013-10-28 ENCOUNTER — Ambulatory Visit (INDEPENDENT_AMBULATORY_CARE_PROVIDER_SITE_OTHER): Payer: Medicare Other | Admitting: Pulmonary Disease

## 2013-10-28 ENCOUNTER — Ambulatory Visit (INDEPENDENT_AMBULATORY_CARE_PROVIDER_SITE_OTHER)
Admission: RE | Admit: 2013-10-28 | Discharge: 2013-10-28 | Disposition: A | Discharge status: Home / Self Care | Payer: Medicare Other | Source: Ambulatory Visit | Attending: Pulmonary Disease | Admitting: Pulmonary Disease

## 2013-10-28 ENCOUNTER — Encounter: Payer: Self-pay | Admitting: Pulmonary Disease

## 2013-10-28 VITALS — BP 124/84 | HR 71 | Temp 98.6°F | Ht 73.0 in | Wt 203.8 lb

## 2013-10-28 DIAGNOSIS — J438 Other emphysema: Secondary | ICD-10-CM

## 2013-10-28 DIAGNOSIS — J40 Bronchitis, not specified as acute or chronic: Secondary | ICD-10-CM

## 2013-10-28 DIAGNOSIS — J439 Emphysema, unspecified: Secondary | ICD-10-CM

## 2013-10-28 DIAGNOSIS — J329 Chronic sinusitis, unspecified: Secondary | ICD-10-CM

## 2013-10-28 DIAGNOSIS — J90 Pleural effusion, not elsewhere classified: Secondary | ICD-10-CM

## 2013-10-28 MED ORDER — CEFDINIR 300 MG PO CAPS: 300.0000 mg | ORAL_CAPSULE | Freq: Two times a day (BID) | ORAL | Status: DC

## 2013-10-28 NOTE — Patient Instructions (Signed)
Will treat with omnicef 300mg , 2 each am for 7 days to treat for sinusitis/bronchitis Continue on symbicort Will check chest xray today and call with results. followup with me again in 62mos.

## 2013-10-28 NOTE — Assessment & Plan Note (Signed)
The patient is coughing up purulent mucus, and also having sinus congestion with purulent discharge. He will need a course of antibiotics for sinobronchitis.

## 2013-10-28 NOTE — Addendum Note (Signed)
Addended by: Lilli Few on: 10/28/2013 10:23 AM   Modules accepted: Orders

## 2013-10-28 NOTE — Progress Notes (Signed)
   Subjective:    Patient ID: Justin Garner, male    DOB: 1933/02/16, 80 y.o.   MRN: [id number]  HPI The patient comes in today for followup of his known COPD and also history of right pleural effusion which is felt secondary to congestive heart failure. He had a thoracentesis in April which showed negative cultures and cytology, but unfortunately radiology did not send chemistries. He has not had imaging since that time according to the patient, and we will check again today. He felt the new anti-cholinergic medication helped his breathing, but could not afford it.  He has been maintaining on Symbicort and feels that his breathing is near his usual baseline.  However, he has had cough with purulent mucus as well as sinus congestion with purulent nasal discharge.   Review of Systems  Constitutional: Negative for fever and unexpected weight change.  HENT: Negative for congestion, dental problem, ear pain, nosebleeds, postnasal drip, rhinorrhea, sinus pressure, sneezing, sore throat and trouble swallowing.   Eyes: Negative for redness and itching.  Respiratory: Positive for cough and shortness of breath. Negative for chest tightness and wheezing.   Cardiovascular: Negative for palpitations and leg swelling.  Gastrointestinal: Negative for nausea and vomiting.  Genitourinary: Negative for dysuria.  Musculoskeletal: Negative for joint swelling.  Skin: Negative for rash.  Neurological: Negative for headaches.  Hematological: Does not bruise/bleed easily.  Psychiatric/Behavioral: Negative for dysphoric mood. The patient is not nervous/anxious.        Objective:   Physical Exam Thin male in no acute distress Nose without purulence or discharge noted  Neck without lymphadenopathy or thyromegaly Chest with scattered crackles, especially in the right base, no active wheezing Cardiac exam with irregular rhythm but controlled ventricular response Lower extremities with 1+ edema bilaterally, no  cyanosis Alert and oriented, moves all 4 extremities.       Assessment & Plan:

## 2013-10-28 NOTE — Assessment & Plan Note (Signed)
The patient appears to be stable on Symbicort alone, with no evidence for increased work of breathing or bronchospasm. I have asked him to continue on this, and to stay as active as possible.

## 2013-10-28 NOTE — Assessment & Plan Note (Signed)
The patient has a history of a right effusion that I suspect is related to congestive heart failure. He has not had a followup recently since his thoracentesis in April, and will check this. By exam, it does not appear that he has had a significant accumulation.

## 2013-11-11 ENCOUNTER — Ambulatory Visit (INDEPENDENT_AMBULATORY_CARE_PROVIDER_SITE_OTHER): Payer: Medicare Other | Admitting: *Deleted

## 2013-11-11 DIAGNOSIS — I4891 Unspecified atrial fibrillation: Secondary | ICD-10-CM

## 2013-11-11 DIAGNOSIS — I2699 Other pulmonary embolism without acute cor pulmonale: Secondary | ICD-10-CM

## 2013-11-11 DIAGNOSIS — Z5181 Encounter for therapeutic drug level monitoring: Secondary | ICD-10-CM

## 2013-11-11 LAB — POCT INR: INR: 2.9

## 2013-12-04 ENCOUNTER — Other Ambulatory Visit: Payer: Self-pay | Admitting: Cardiology

## 2013-12-04 ENCOUNTER — Other Ambulatory Visit: Payer: Self-pay | Admitting: Pharmacist

## 2013-12-04 MED ORDER — WARFARIN SODIUM 5 MG PO TABS: ORAL_TABLET | ORAL | Status: DC

## 2013-12-04 MED ORDER — FENOFIBRATE MICRONIZED 67 MG PO CAPS: 67.0000 mg | ORAL_CAPSULE | Freq: Every day | ORAL | Status: DC

## 2013-12-24 ENCOUNTER — Encounter: Payer: Self-pay | Admitting: Pulmonary Disease

## 2013-12-24 ENCOUNTER — Ambulatory Visit (INDEPENDENT_AMBULATORY_CARE_PROVIDER_SITE_OTHER): Payer: Medicare Other | Admitting: Pulmonary Disease

## 2013-12-24 ENCOUNTER — Ambulatory Visit (INDEPENDENT_AMBULATORY_CARE_PROVIDER_SITE_OTHER): Payer: Medicare Other | Admitting: *Deleted

## 2013-12-24 VITALS — BP 122/76 | HR 93 | Temp 97.7°F | Ht 73.0 in | Wt 198.2 lb

## 2013-12-24 DIAGNOSIS — J439 Emphysema, unspecified: Secondary | ICD-10-CM

## 2013-12-24 DIAGNOSIS — I4891 Unspecified atrial fibrillation: Secondary | ICD-10-CM

## 2013-12-24 DIAGNOSIS — I2699 Other pulmonary embolism without acute cor pulmonale: Secondary | ICD-10-CM

## 2013-12-24 DIAGNOSIS — Z5181 Encounter for therapeutic drug level monitoring: Secondary | ICD-10-CM

## 2013-12-24 LAB — POCT INR: INR: 2.7

## 2013-12-24 NOTE — Progress Notes (Signed)
   Subjective:    Patient ID: Justin Garner, male    DOB: 1932/04/29, 80 y.o.   MRN: [id number]  HPI The patient comes in today for an acute sick visit. He has noticed increasing cough primarily at night, but has not seen any increased mucus or purulence. He does occasionally have a pinkish tint to the mucus, but he is on Coumadin. He does not feel overly congested, nor has he had a big change in his breathing. He has had no fevers, chills, or sweats. He does have some postnasal drip, but doesn't think it is overly significant. He takes a proton pump inhibitor for reflux, and feels this has been controlled.   Review of Systems  Constitutional: Negative for fever and unexpected weight change.  HENT: Positive for congestion and postnasal drip. Negative for dental problem, ear pain, nosebleeds, rhinorrhea, sinus pressure, sneezing, sore throat and trouble swallowing.   Eyes: Negative for redness and itching.  Respiratory: Positive for cough and shortness of breath. Negative for chest tightness and wheezing.   Cardiovascular: Negative for palpitations and leg swelling.  Gastrointestinal: Negative for nausea and vomiting.  Genitourinary: Negative for dysuria.  Musculoskeletal: Negative for joint swelling.  Skin: Negative for rash.  Neurological: Negative for headaches.  Hematological: Does not bruise/bleed easily.  Psychiatric/Behavioral: Negative for dysphoric mood. The patient is not nervous/anxious.        Objective:   Physical Exam Well-developed male in no acute distress Nose without purulence or discharge noted Oropharynx clear Neck without lymphadenopathy or thyromegaly Chest with a few basilar crackles, no active wheezes or rhonchi.  Adequate airflow bilaterally Cardiac exam with slight irregularity but controlled ventricular response Lower extremities with minimal ankle edema, no cyanosis Alert and oriented, moves all 4 extremities.       Assessment & Plan:

## 2013-12-24 NOTE — Assessment & Plan Note (Signed)
The patient is having increased cough that is occurring primarily at night, and this is always suspicious for upper airway sources such as postnasal drip or reflux. The patient is unsure where his cough is coming from, but based on his history he does not sound infected at this time. I would like to treat him more aggressively for postnasal drip, and would also like to add a LAMA to his regimen to see if it can reduce lower airway secretions. He has no increased work of breathing today, and again is not producing any purulence. He is to call us should he not improve, or if his mucus changes over to a more purulent-looking color.

## 2013-12-24 NOTE — Patient Instructions (Signed)
Please get zyrtec 10mg  OTC, and take one each night after dinner. Stay on your symbicort twice a day, and would like for you to try spiriva respimat  2 inhalations each am everyday to see if we can decrease your mucus production.  Try for one month, and let me know if helps so we can call in prescription. If you begin to bring up nasty mucus, or if breathing worsens, please call us.

## 2013-12-27 ENCOUNTER — Inpatient Hospital Stay (HOSPITAL_COMMUNITY)
Admission: EM | Admit: 2013-12-27 | Discharge: 2014-01-02 | DRG: 871 | Disposition: A | Discharge status: Home Health | Payer: Medicare Other | Attending: Internal Medicine | Admitting: Internal Medicine

## 2013-12-27 ENCOUNTER — Encounter (HOSPITAL_COMMUNITY): Payer: Self-pay | Admitting: Emergency Medicine

## 2013-12-27 ENCOUNTER — Emergency Department (HOSPITAL_COMMUNITY): Payer: Medicare Other

## 2013-12-27 DIAGNOSIS — I480 Paroxysmal atrial fibrillation: Secondary | ICD-10-CM | POA: Diagnosis present

## 2013-12-27 DIAGNOSIS — J189 Pneumonia, unspecified organism: Secondary | ICD-10-CM

## 2013-12-27 DIAGNOSIS — I482 Chronic atrial fibrillation, unspecified: Secondary | ICD-10-CM | POA: Diagnosis present

## 2013-12-27 DIAGNOSIS — Z8249 Family history of ischemic heart disease and other diseases of the circulatory system: Secondary | ICD-10-CM

## 2013-12-27 DIAGNOSIS — Z7901 Long term (current) use of anticoagulants: Secondary | ICD-10-CM

## 2013-12-27 DIAGNOSIS — Z87891 Personal history of nicotine dependence: Secondary | ICD-10-CM

## 2013-12-27 DIAGNOSIS — I129 Hypertensive chronic kidney disease with stage 1 through stage 4 chronic kidney disease, or unspecified chronic kidney disease: Secondary | ICD-10-CM | POA: Diagnosis present

## 2013-12-27 DIAGNOSIS — R0602 Shortness of breath: Secondary | ICD-10-CM

## 2013-12-27 DIAGNOSIS — A419 Sepsis, unspecified organism: Secondary | ICD-10-CM | POA: Diagnosis not present

## 2013-12-27 DIAGNOSIS — J440 Chronic obstructive pulmonary disease with acute lower respiratory infection: Secondary | ICD-10-CM | POA: Diagnosis present

## 2013-12-27 DIAGNOSIS — Z955 Presence of coronary angioplasty implant and graft: Secondary | ICD-10-CM

## 2013-12-27 DIAGNOSIS — G4700 Insomnia, unspecified: Secondary | ICD-10-CM | POA: Diagnosis present

## 2013-12-27 DIAGNOSIS — N182 Chronic kidney disease, stage 2 (mild): Secondary | ICD-10-CM | POA: Diagnosis present

## 2013-12-27 DIAGNOSIS — E1122 Type 2 diabetes mellitus with diabetic chronic kidney disease: Secondary | ICD-10-CM

## 2013-12-27 DIAGNOSIS — E039 Hypothyroidism, unspecified: Secondary | ICD-10-CM | POA: Diagnosis present

## 2013-12-27 DIAGNOSIS — J432 Centrilobular emphysema: Secondary | ICD-10-CM

## 2013-12-27 DIAGNOSIS — Z79899 Other long term (current) drug therapy: Secondary | ICD-10-CM

## 2013-12-27 DIAGNOSIS — E785 Hyperlipidemia, unspecified: Secondary | ICD-10-CM | POA: Diagnosis present

## 2013-12-27 DIAGNOSIS — K219 Gastro-esophageal reflux disease without esophagitis: Secondary | ICD-10-CM | POA: Diagnosis present

## 2013-12-27 DIAGNOSIS — I509 Heart failure, unspecified: Secondary | ICD-10-CM | POA: Diagnosis present

## 2013-12-27 DIAGNOSIS — J441 Chronic obstructive pulmonary disease with (acute) exacerbation: Secondary | ICD-10-CM | POA: Diagnosis present

## 2013-12-27 DIAGNOSIS — J309 Allergic rhinitis, unspecified: Secondary | ICD-10-CM | POA: Diagnosis present

## 2013-12-27 DIAGNOSIS — I2510 Atherosclerotic heart disease of native coronary artery without angina pectoris: Secondary | ICD-10-CM | POA: Diagnosis present

## 2013-12-27 DIAGNOSIS — J438 Other emphysema: Secondary | ICD-10-CM

## 2013-12-27 DIAGNOSIS — I10 Essential (primary) hypertension: Secondary | ICD-10-CM | POA: Diagnosis not present

## 2013-12-27 DIAGNOSIS — E876 Hypokalemia: Secondary | ICD-10-CM

## 2013-12-27 DIAGNOSIS — Z86711 Personal history of pulmonary embolism: Secondary | ICD-10-CM

## 2013-12-27 DIAGNOSIS — E860 Dehydration: Secondary | ICD-10-CM | POA: Diagnosis present

## 2013-12-27 DIAGNOSIS — E43 Unspecified severe protein-calorie malnutrition: Secondary | ICD-10-CM

## 2013-12-27 DIAGNOSIS — J9601 Acute respiratory failure with hypoxia: Secondary | ICD-10-CM

## 2013-12-27 DIAGNOSIS — E119 Type 2 diabetes mellitus without complications: Secondary | ICD-10-CM | POA: Diagnosis present

## 2013-12-27 LAB — HEPATIC FUNCTION PANEL
ALBUMIN: 3.1 g/dL — AB (ref 3.5–5.2)
ALT: 40 U/L (ref 0–53)
AST: 44 U/L — AB (ref 0–37)
Alkaline Phosphatase: 169 U/L — ABNORMAL HIGH (ref 39–117)
Bilirubin, Direct: 1.5 mg/dL — ABNORMAL HIGH (ref 0.0–0.3)
Indirect Bilirubin: 1.6 mg/dL — ABNORMAL HIGH (ref 0.3–0.9)
Total Bilirubin: 3.1 mg/dL — ABNORMAL HIGH (ref 0.3–1.2)
Total Protein: 7 g/dL (ref 6.0–8.3)

## 2013-12-27 LAB — URINE MICROSCOPIC-ADD ON

## 2013-12-27 LAB — CBC WITH DIFFERENTIAL/PLATELET
Basophils Absolute: 0.1 10*3/uL (ref 0.0–0.1)
Basophils Relative: 0 % (ref 0–1)
Eosinophils Absolute: 0 10*3/uL (ref 0.0–0.7)
Eosinophils Relative: 0 % (ref 0–5)
HCT: 44.2 % (ref 39.0–52.0)
Hemoglobin: 15.9 g/dL (ref 13.0–17.0)
LYMPHS ABS: 1.3 10*3/uL (ref 0.7–4.0)
Lymphocytes Relative: 7 % — ABNORMAL LOW (ref 12–46)
MCH: 31.5 pg (ref 26.0–34.0)
MCHC: 36 g/dL (ref 30.0–36.0)
MCV: 87.5 fL (ref 78.0–100.0)
MONO ABS: 1.3 10*3/uL — AB (ref 0.1–1.0)
Monocytes Relative: 7 % (ref 3–12)
NEUTROS PCT: 86 % — AB (ref 43–77)
Neutro Abs: 16.6 10*3/uL — ABNORMAL HIGH (ref 1.7–7.7)
PLATELETS: 322 10*3/uL (ref 150–400)
RBC: 5.05 MIL/uL (ref 4.22–5.81)
RDW: 15 % (ref 11.5–15.5)
WBC: 19.2 10*3/uL — ABNORMAL HIGH (ref 4.0–10.5)

## 2013-12-27 LAB — URINALYSIS, ROUTINE W REFLEX MICROSCOPIC
Glucose, UA: 100 mg/dL — AB
Hgb urine dipstick: NEGATIVE
Ketones, ur: 15 mg/dL — AB
Nitrite: POSITIVE — AB
PROTEIN: 30 mg/dL — AB
Specific Gravity, Urine: 1.031 — ABNORMAL HIGH (ref 1.005–1.030)
UROBILINOGEN UA: 4 mg/dL — AB (ref 0.0–1.0)
pH: 5.5 (ref 5.0–8.0)

## 2013-12-27 LAB — BASIC METABOLIC PANEL
Anion gap: 17 — ABNORMAL HIGH (ref 5–15)
BUN: 25 mg/dL — AB (ref 6–23)
CHLORIDE: 98 meq/L (ref 96–112)
CO2: 25 meq/L (ref 19–32)
CREATININE: 1.47 mg/dL — AB (ref 0.50–1.35)
Calcium: 8.7 mg/dL (ref 8.4–10.5)
GFR calc Af Amer: 51 mL/min — ABNORMAL LOW (ref >90)
GFR calc non Af Amer: 44 mL/min — ABNORMAL LOW (ref >90)
Glucose, Bld: 119 mg/dL — ABNORMAL HIGH (ref 70–99)
Potassium: 3 mEq/L — ABNORMAL LOW (ref 3.7–5.3)
Sodium: 140 mEq/L (ref 137–147)

## 2013-12-27 LAB — PROTIME-INR
INR: 3.64 — ABNORMAL HIGH (ref 0.00–1.49)
Prothrombin Time: 36.2 seconds — ABNORMAL HIGH (ref 11.6–15.2)

## 2013-12-27 LAB — APTT: APTT: 45 s — AB (ref 24–37)

## 2013-12-27 LAB — I-STAT TROPONIN, ED: TROPONIN I, POC: 0.01 ng/mL (ref 0.00–0.08)

## 2013-12-27 LAB — CBG MONITORING, ED: Glucose-Capillary: 100 mg/dL — ABNORMAL HIGH (ref 70–99)

## 2013-12-27 LAB — PRO B NATRIURETIC PEPTIDE: Pro B Natriuretic peptide (BNP): 1235 pg/mL — ABNORMAL HIGH (ref 0–450)

## 2013-12-27 MED ORDER — SODIUM CHLORIDE 0.9 % IV BOLUS (SEPSIS)
250.0000 mL | Freq: Once | INTRAVENOUS | Status: AC
  Administered 2013-12-27: 250 mL via INTRAVENOUS

## 2013-12-27 MED ORDER — POTASSIUM CHLORIDE CRYS ER 20 MEQ PO TBCR
60.0000 meq | EXTENDED_RELEASE_TABLET | Freq: Once | ORAL | Status: AC
  Administered 2013-12-27: 60 meq via ORAL
  Filled 2013-12-27: qty 3

## 2013-12-27 MED ORDER — DILTIAZEM HCL 25 MG/5ML IV SOLN
5.0000 mg | Freq: Once | INTRAVENOUS | Status: AC
  Administered 2013-12-27: 5 mg via INTRAVENOUS
  Filled 2013-12-27: qty 5

## 2013-12-27 NOTE — ED Provider Notes (Signed)
CSN: [id number]     Arrival date & time 12/27/13  2131 History   First MD Initiated Contact with Patient 12/27/13 2146     Chief Complaint  Patient presents with  . Weakness    (Consider location/radiation/quality/duration/timing/severity/associated sxs/prior Treatment) HPI Comments: Patient is a 80 y/o male with a hx of CAD (s/p stenting in 2003 for "chest pressure"), CHF with LVEF 50% on cardiac cath 08/19/13, PAF on chronic coumadin, COPD, PTX in 2012, PE, and R upper lobectomy in 2009. He presents to the ED today for worsening cough. Wife states that patient has been having a congested, nonproductive cough x 1 week. Symptoms have been worsening since onset with associated dyspnea on exertion, chills with subjective fever, and fatigue. Wife also endorses anorexia as patient has had a significant decrease in his appetite since symptoms began. Patient went to see his pulmonologist 3 days ago who thought cough was secondary to postnasal drip. Patient was placed on Zyrtec since this time without relief of symptoms. Patient denies associated chest pain, hemoptysis, nausea, vomiting, diarrhea, melena or hematochezia, and syncope. No known sick contacts.  PCP - Dr. Delfina Redwood Pulmonologist - Dr. Gwenette Greet Cardiologist - Dr. Irish Lack  Patient is a 80 y.o. male presenting with weakness. The history is provided by the patient. No language interpreter was used.  Weakness Associated symptoms include chills, congestion (mild), coughing, fatigue, a fever (subjective) and weakness (generalized). Pertinent negatives include no chest pain, nausea or vomiting.    Past Medical History  Diagnosis Date  . Diabetes mellitus   . Thyroid disease   . Coronary artery disease   . COPD (chronic obstructive pulmonary disease)   . GERD (gastroesophageal reflux disease)   . Pulmonary embolism   . Hyperlipidemia   . FH: colonic polyps   . PAF (paroxysmal atrial fibrillation)   . Hypertension   . ASCVD (arteriosclerotic  cardiovascular disease)   . Cancer    Past Surgical History  Procedure Laterality Date  . Cholecystectomy    . Hernia repair    . Carotid stent    . Stent proximal    . Patent stents    . Inguinal hernia repair  10/10/2010  . Fiberoptic bronchoscopy and meiastinoscopy  06/07/2007  . R vats,thoracotomy and upper lobectomy  08/02/2007   Family History  Problem Relation Age of Onset  . Heart disease Father    History  Substance Use Topics  . Smoking status: Former Smoker -- 1.00 packs/day for 50 years    Types: Cigarettes    Quit date: 03/20/2001  . Smokeless tobacco: Former Systems developer  . Alcohol Use: No    Review of Systems  Constitutional: Positive for fever (subjective), chills, appetite change and fatigue.  HENT: Positive for congestion (mild).   Respiratory: Positive for cough and shortness of breath.   Cardiovascular: Negative for chest pain.  Gastrointestinal: Negative for nausea, vomiting and blood in stool.  Neurological: Positive for weakness (generalized). Negative for syncope.  All other systems reviewed and are negative.   Allergies  Review of patient's allergies indicates no known allergies.  Home Medications   Prior to Admission medications   Medication Sig Start Date End Date Taking? Authorizing Provider  atorvastatin (LIPITOR) 20 MG tablet Take 20 mg by mouth every morning.   Yes Historical Provider, MD  budesonide-formoterol (SYMBICORT) 160-4.5 MCG/ACT inhaler Inhale 2 puffs into the lungs 2 (two) times daily.    Yes Historical Provider, MD  diltiazem (CARDIZEM CD) 240 MG 24 hr capsule Take 240  mg by mouth daily.   Yes Historical Provider, MD  fenofibrate micronized (LOFIBRA) 67 MG capsule Take 67 mg by mouth daily before breakfast.   Yes Historical Provider, MD  fish oil-omega-3 fatty acids 1000 MG capsule Take 1 g by mouth 2 (two) times daily.    Yes Historical Provider, MD  furosemide (LASIX) 20 MG tablet Take 20 mg by mouth daily.  08/01/13  Yes Historical  Provider, MD  glipiZIDE (GLUCOTROL XL) 10 MG 24 hr tablet Take 10 mg by mouth daily with breakfast.    Yes Historical Provider, MD  JANUVIA 50 MG tablet Take 50 mg by mouth every morning.  07/21/11  Yes Historical Provider, MD  levothyroxine (SYNTHROID, LEVOTHROID) 112 MCG tablet Take 112 mcg by mouth daily.     Yes Historical Provider, MD  nitroGLYCERIN (NITROSTAT) 0.4 MG SL tablet Place 0.4 mg under the tongue every 5 (five) minutes as needed for chest pain.   Yes Historical Provider, MD  omeprazole (PRILOSEC) 20 MG capsule Take 20 mg by mouth daily.     Yes Historical Provider, MD  warfarin (COUMADIN) 5 MG tablet Take 5-7.5 mg by mouth daily. Take 5mg  on Sun, Tues, Thur, Sat Take 7.5mg  on Mon, Wed, & Fri.   Yes Historical Provider, MD   BP 114/72  Pulse 66  Temp(Src) 100.2 F (37.9 C) (Rectal)  Resp 17  Ht 6\' 1"  (1.854 m)  Wt 198 lb (89.812 kg)  BMI 26.13 kg/m2  SpO2 94%  Physical Exam  Nursing note and vitals reviewed. Constitutional: He is oriented to person, place, and time. He appears well-developed and well-nourished. No distress.  Patient does not appear toxic or septic.  HENT:  Head: Normocephalic and atraumatic.  Eyes: Conjunctivae and EOM are normal. No scleral icterus.  Neck: Normal range of motion. Neck supple.  No JVD  Cardiovascular: An irregularly irregular rhythm present. Tachycardia present.   HR 104-130's  Pulmonary/Chest: No respiratory distress. He has wheezes. He has rales.  Mild expiratory wheezing diffusely with faint rales in LLL. Patient appears dyspneic when speaking for prolonged periods. No dyspnea or tachypnea at rest. No accessory muscle use.  Abdominal: Soft. He exhibits no distension and no mass. There is no tenderness.  Soft, nontender  Musculoskeletal: Normal range of motion.  Neurological: He is alert and oriented to person, place, and time. He exhibits normal muscle tone. Coordination normal.  GCS 15. Speech is goal oriented. Patient moves  extremities without ataxia.  Skin: Skin is warm and dry. No rash noted. He is not diaphoretic. No erythema. No pallor.  Psychiatric: He has a normal mood and affect. His behavior is normal.    ED Course  Procedures (including critical care time) Labs Review Labs Reviewed  CBC WITH DIFFERENTIAL - Abnormal; Notable for the following:    WBC 19.2 (*)    Neutrophils Relative % 86 (*)    Neutro Abs 16.6 (*)    Lymphocytes Relative 7 (*)    Monocytes Absolute 1.3 (*)    All other components within normal limits  BASIC METABOLIC PANEL - Abnormal; Notable for the following:    Potassium 3.0 (*)    Glucose, Bld 119 (*)    BUN 25 (*)    Creatinine, Ser 1.47 (*)    GFR calc non Af Amer 44 (*)    GFR calc Af Amer 51 (*)    Anion gap 17 (*)    All other components within normal limits  PROTIME-INR - Abnormal; Notable for  the following:    Prothrombin Time 36.2 (*)    INR 3.64 (*)    All other components within normal limits  APTT - Abnormal; Notable for the following:    aPTT 45 (*)    All other components within normal limits  URINALYSIS, ROUTINE W REFLEX MICROSCOPIC - Abnormal; Notable for the following:    Color, Urine ORANGE (*)    Specific Gravity, Urine 1.031 (*)    Glucose, UA 100 (*)    Bilirubin Urine MODERATE (*)    Ketones, ur 15 (*)    Protein, ur 30 (*)    Urobilinogen, UA 4.0 (*)    Nitrite POSITIVE (*)    Leukocytes, UA SMALL (*)    All other components within normal limits  PRO B NATRIURETIC PEPTIDE - Abnormal; Notable for the following:    Pro B Natriuretic peptide (BNP) 1235.0 (*)    All other components within normal limits  URINE MICROSCOPIC-ADD ON - Abnormal; Notable for the following:    Casts HYALINE CASTS (*)    All other components within normal limits  HEPATIC FUNCTION PANEL - Abnormal; Notable for the following:    Albumin 3.1 (*)    AST 44 (*)    Alkaline Phosphatase 169 (*)    Total Bilirubin 3.1 (*)    Bilirubin, Direct 1.5 (*)    Indirect  Bilirubin 1.6 (*)    All other components within normal limits  CBG MONITORING, ED - Abnormal; Notable for the following:    Glucose-Capillary 100 (*)    All other components within normal limits  I-STAT TROPOININ, ED   Imaging Review No results found.   EKG Interpretation   Date/Time:  Saturday December 27 2013 21:38:45 EDT Ventricular Rate:  132 PR Interval:    QRS Duration: 88 QT Interval:  352 QTC Calculation: 521 R Axis:   105 Text Interpretation:  Atrial fibrillation with rapid ventricular response  with premature ventricular or aberrantly conducted complexes Rightward  axis Possible Anterior infarct , age undetermined Marked ST abnormality,  possible inferior subendocardial injury Abnormal ECG Since last tracing  rate faster sT changes new Confirmed by MILLER  MD, BRIAN ([id number]) on  12/27/2013 9:57:09 PM     CRITICAL CARE Performed by: Antonietta Breach   Total critical care time: 35  Critical care time was exclusive of separately billable procedures and treating other patients.  Critical care was necessary to treat or prevent imminent or life-threatening deterioration.  Critical care was time spent personally by me on the following activities: development of treatment plan with patient and/or surrogate as well as nursing, discussions with consultants, evaluation of patient's response to treatment, examination of patient, obtaining history from patient or surrogate, ordering and performing treatments and interventions, ordering and review of laboratory studies, ordering and review of radiographic studies, pulse oximetry and re-evaluation of patient's condition.  MDM   Final diagnoses:  Shortness of breath  Community acquired pneumonia  Sepsis, due to unspecified organism  Hypokalemia    80 year old male presents to the emergency department for cough and worsening shortness of breath. Patient found to have a low-grade temperature 100.57F as well as a leukocytosis of  19.2. Chest x-ray shows findings consistent with left posterior lower lobe pneumonia. Patient without hypoxia; however, atrial fibrillation and has been fluctuating between a normal and tachycardic rate. Given intermittent tachycardia and leukocytosis with evidence of pneumonia, patient meets the criteria. Will admit to [facility] for further management. Case discussed with Dr. Roel Cluck who will admit  patient. Telemetry bed/temp admit orders placed.   Filed Vitals:   12/27/13 2245 12/27/13 2259 12/27/13 2315 12/28/13 0002  BP: 125/93  114/72 104/72  Pulse: 118  66 63  Temp:  100.2 F (37.9 C)    TempSrc:  Rectal    Resp: 19  17 19   Height:      Weight:      SpO2: 96%  94% 93%     Antonietta Breach, PA-C 12/28/13 0026

## 2013-12-27 NOTE — ED Notes (Signed)
CBG 100 

## 2013-12-27 NOTE — ED Provider Notes (Signed)
80 year old male with history of fibrillation, presents with one week of coughing, he is tachypneic on exam, tachycardic to 130 beats per minute in atrial fibrillation with a rapid ventricular response, there is no peripheral edema, slight rales heard right greater than left. He does have scarring in his right upper posterior thorax consistent with prior lung surgery, he is known to have COPD. He will need an x-ray, labs, evaluation for pneumonia or congestive heart failure I suspect pneumonia given his cough, hypoxia and leukocytosis of 19,000.  CXR shows that the pt has a Lower lobe pna - there is no hypoxia but there is tachycardia, a fib and leukocytosis.    Medical screening examination/treatment/procedure(s) were conducted as a shared visit with non-physician practitioner(s) and myself.  I personally evaluated the patient during the encounter.  Clinical Impression:   Final diagnoses:  Shortness of breath  Community acquired pneumonia  Sepsis, due to unspecified organism  Hypokalemia         Johnna Acosta, MD 12/28/13 1031

## 2013-12-27 NOTE — ED Notes (Signed)
Pt presents with weakness and not acting himself for the past week per pt wife- pt currently denies any pain or complaints.  Pt noted to be a-fib on the monitor rate 130's.  Pt speaking in full sentences at present.

## 2013-12-28 DIAGNOSIS — J440 Chronic obstructive pulmonary disease with acute lower respiratory infection: Secondary | ICD-10-CM | POA: Diagnosis present

## 2013-12-28 DIAGNOSIS — E876 Hypokalemia: Secondary | ICD-10-CM | POA: Diagnosis present

## 2013-12-28 DIAGNOSIS — E860 Dehydration: Secondary | ICD-10-CM | POA: Diagnosis present

## 2013-12-28 DIAGNOSIS — Z7901 Long term (current) use of anticoagulants: Secondary | ICD-10-CM | POA: Diagnosis not present

## 2013-12-28 DIAGNOSIS — E785 Hyperlipidemia, unspecified: Secondary | ICD-10-CM | POA: Diagnosis present

## 2013-12-28 DIAGNOSIS — J432 Centrilobular emphysema: Secondary | ICD-10-CM

## 2013-12-28 DIAGNOSIS — I129 Hypertensive chronic kidney disease with stage 1 through stage 4 chronic kidney disease, or unspecified chronic kidney disease: Secondary | ICD-10-CM | POA: Diagnosis present

## 2013-12-28 DIAGNOSIS — N182 Chronic kidney disease, stage 2 (mild): Secondary | ICD-10-CM

## 2013-12-28 DIAGNOSIS — Z79899 Other long term (current) drug therapy: Secondary | ICD-10-CM | POA: Diagnosis not present

## 2013-12-28 DIAGNOSIS — J309 Allergic rhinitis, unspecified: Secondary | ICD-10-CM | POA: Diagnosis present

## 2013-12-28 DIAGNOSIS — I480 Paroxysmal atrial fibrillation: Secondary | ICD-10-CM | POA: Diagnosis present

## 2013-12-28 DIAGNOSIS — J9601 Acute respiratory failure with hypoxia: Secondary | ICD-10-CM | POA: Diagnosis not present

## 2013-12-28 DIAGNOSIS — J438 Other emphysema: Secondary | ICD-10-CM

## 2013-12-28 DIAGNOSIS — E43 Unspecified severe protein-calorie malnutrition: Secondary | ICD-10-CM | POA: Diagnosis present

## 2013-12-28 DIAGNOSIS — E039 Hypothyroidism, unspecified: Secondary | ICD-10-CM | POA: Diagnosis present

## 2013-12-28 DIAGNOSIS — I509 Heart failure, unspecified: Secondary | ICD-10-CM | POA: Diagnosis present

## 2013-12-28 DIAGNOSIS — I2510 Atherosclerotic heart disease of native coronary artery without angina pectoris: Secondary | ICD-10-CM | POA: Diagnosis present

## 2013-12-28 DIAGNOSIS — E119 Type 2 diabetes mellitus without complications: Secondary | ICD-10-CM | POA: Diagnosis present

## 2013-12-28 DIAGNOSIS — J441 Chronic obstructive pulmonary disease with (acute) exacerbation: Secondary | ICD-10-CM | POA: Diagnosis present

## 2013-12-28 DIAGNOSIS — E1122 Type 2 diabetes mellitus with diabetic chronic kidney disease: Secondary | ICD-10-CM | POA: Diagnosis present

## 2013-12-28 DIAGNOSIS — I10 Essential (primary) hypertension: Secondary | ICD-10-CM | POA: Diagnosis present

## 2013-12-28 DIAGNOSIS — I4891 Unspecified atrial fibrillation: Secondary | ICD-10-CM

## 2013-12-28 DIAGNOSIS — Z8249 Family history of ischemic heart disease and other diseases of the circulatory system: Secondary | ICD-10-CM | POA: Diagnosis not present

## 2013-12-28 DIAGNOSIS — Z955 Presence of coronary angioplasty implant and graft: Secondary | ICD-10-CM | POA: Diagnosis not present

## 2013-12-28 DIAGNOSIS — K219 Gastro-esophageal reflux disease without esophagitis: Secondary | ICD-10-CM | POA: Diagnosis present

## 2013-12-28 DIAGNOSIS — Z86711 Personal history of pulmonary embolism: Secondary | ICD-10-CM | POA: Diagnosis not present

## 2013-12-28 DIAGNOSIS — J189 Pneumonia, unspecified organism: Secondary | ICD-10-CM

## 2013-12-28 DIAGNOSIS — R0602 Shortness of breath: Secondary | ICD-10-CM

## 2013-12-28 DIAGNOSIS — Z87891 Personal history of nicotine dependence: Secondary | ICD-10-CM | POA: Diagnosis not present

## 2013-12-28 DIAGNOSIS — A419 Sepsis, unspecified organism: Principal | ICD-10-CM

## 2013-12-28 DIAGNOSIS — G4700 Insomnia, unspecified: Secondary | ICD-10-CM | POA: Diagnosis present

## 2013-12-28 LAB — COMPREHENSIVE METABOLIC PANEL
ALK PHOS: 149 U/L — AB (ref 39–117)
ALT: 35 U/L (ref 0–53)
AST: 34 U/L (ref 0–37)
Albumin: 2.7 g/dL — ABNORMAL LOW (ref 3.5–5.2)
Anion gap: 18 — ABNORMAL HIGH (ref 5–15)
BILIRUBIN TOTAL: 2.6 mg/dL — AB (ref 0.3–1.2)
BUN: 24 mg/dL — ABNORMAL HIGH (ref 6–23)
CO2: 22 meq/L (ref 19–32)
CREATININE: 1.32 mg/dL (ref 0.50–1.35)
Calcium: 8.2 mg/dL — ABNORMAL LOW (ref 8.4–10.5)
Chloride: 99 mEq/L (ref 96–112)
GFR calc Af Amer: 58 mL/min — ABNORMAL LOW (ref >90)
GFR calc non Af Amer: 50 mL/min — ABNORMAL LOW (ref >90)
Glucose, Bld: 94 mg/dL (ref 70–99)
POTASSIUM: 3.1 meq/L — AB (ref 3.7–5.3)
Sodium: 139 mEq/L (ref 137–147)
Total Protein: 6.3 g/dL (ref 6.0–8.3)

## 2013-12-28 LAB — GLUCOSE, CAPILLARY
GLUCOSE-CAPILLARY: 117 mg/dL — AB (ref 70–99)
GLUCOSE-CAPILLARY: 91 mg/dL (ref 70–99)
Glucose-Capillary: 77 mg/dL (ref 70–99)
Glucose-Capillary: 156 mg/dL — ABNORMAL HIGH (ref 70–99)
Glucose-Capillary: 208 mg/dL — ABNORMAL HIGH (ref 70–99)

## 2013-12-28 LAB — HEMOGLOBIN A1C
Hgb A1c MFr Bld: 6.4 % — ABNORMAL HIGH (ref <5.7)
MEAN PLASMA GLUCOSE: 137 mg/dL — AB (ref <117)

## 2013-12-28 LAB — STREP PNEUMONIAE URINARY ANTIGEN: STREP PNEUMO URINARY ANTIGEN: NEGATIVE

## 2013-12-28 LAB — CBC WITH DIFFERENTIAL/PLATELET
BASOS ABS: 0 10*3/uL (ref 0.0–0.1)
Basophils Relative: 0 % (ref 0–1)
EOS PCT: 0 % (ref 0–5)
Eosinophils Absolute: 0.1 10*3/uL (ref 0.0–0.7)
HCT: 41.2 % (ref 39.0–52.0)
Hemoglobin: 14.3 g/dL (ref 13.0–17.0)
Lymphocytes Relative: 7 % — ABNORMAL LOW (ref 12–46)
Lymphs Abs: 1.2 10*3/uL (ref 0.7–4.0)
MCH: 31.2 pg (ref 26.0–34.0)
MCHC: 34.7 g/dL (ref 30.0–36.0)
MCV: 90 fL (ref 78.0–100.0)
MONO ABS: 1.4 10*3/uL — AB (ref 0.1–1.0)
Monocytes Relative: 8 % (ref 3–12)
Neutro Abs: 15 10*3/uL — ABNORMAL HIGH (ref 1.7–7.7)
Neutrophils Relative %: 85 % — ABNORMAL HIGH (ref 43–77)
Platelets: 323 10*3/uL (ref 150–400)
RBC: 4.58 MIL/uL (ref 4.22–5.81)
RDW: 15.2 % (ref 11.5–15.5)
WBC: 17.8 10*3/uL — AB (ref 4.0–10.5)

## 2013-12-28 LAB — MAGNESIUM: Magnesium: 1.2 mg/dL — ABNORMAL LOW (ref 1.5–2.5)

## 2013-12-28 LAB — TROPONIN I
Troponin I: <0.3 ng/mL (ref <0.30)
Troponin I: <0.3 ng/mL (ref <0.30)

## 2013-12-28 LAB — EXPECTORATED SPUTUM ASSESSMENT W GRAM STAIN, RFLX TO RESP C

## 2013-12-28 LAB — EXPECTORATED SPUTUM ASSESSMENT W REFEX TO RESP CULTURE: Special Requests: NORMAL

## 2013-12-28 MED ORDER — DEXTROSE 5 % IV SOLN
500.0000 mg | INTRAVENOUS | Status: DC
  Administered 2013-12-29 – 2014-01-01 (×4): 500 mg via INTRAVENOUS
  Filled 2013-12-28 (×4): qty 500

## 2013-12-28 MED ORDER — INSULIN ASPART 100 UNIT/ML ~~LOC~~ SOLN
0.0000 [IU] | Freq: Three times a day (TID) | SUBCUTANEOUS | Status: DC
  Administered 2013-12-28: 2 [IU] via SUBCUTANEOUS
  Administered 2013-12-29: 3 [IU] via SUBCUTANEOUS
  Administered 2013-12-29: 5 [IU] via SUBCUTANEOUS
  Administered 2013-12-29 – 2013-12-30 (×2): 3 [IU] via SUBCUTANEOUS
  Administered 2013-12-30: 5 [IU] via SUBCUTANEOUS
  Administered 2013-12-30: 3 [IU] via SUBCUTANEOUS
  Administered 2013-12-31: 2 [IU] via SUBCUTANEOUS
  Administered 2013-12-31 (×2): 3 [IU] via SUBCUTANEOUS
  Administered 2014-01-01: 2 [IU] via SUBCUTANEOUS
  Administered 2014-01-01: 5 [IU] via SUBCUTANEOUS
  Administered 2014-01-02: 2 [IU] via SUBCUTANEOUS

## 2013-12-28 MED ORDER — POTASSIUM CHLORIDE CRYS ER 20 MEQ PO TBCR
40.0000 meq | EXTENDED_RELEASE_TABLET | Freq: Every day | ORAL | Status: DC
  Administered 2013-12-28 – 2014-01-02 (×6): 40 meq via ORAL
  Filled 2013-12-28 (×6): qty 2

## 2013-12-28 MED ORDER — LEVALBUTEROL HCL 0.63 MG/3ML IN NEBU: 0.6300 mg | INHALATION_SOLUTION | Freq: Four times a day (QID) | RESPIRATORY_TRACT | Status: DC | PRN

## 2013-12-28 MED ORDER — PANTOPRAZOLE SODIUM 40 MG PO TBEC
40.0000 mg | DELAYED_RELEASE_TABLET | Freq: Every day | ORAL | Status: DC
  Administered 2013-12-28 – 2014-01-02 (×6): 40 mg via ORAL
  Filled 2013-12-28 (×6): qty 1

## 2013-12-28 MED ORDER — LEVOTHYROXINE SODIUM 112 MCG PO TABS
112.0000 ug | ORAL_TABLET | Freq: Every day | ORAL | Status: DC
  Administered 2013-12-28 – 2014-01-01 (×5): 112 ug via ORAL
  Filled 2013-12-28 (×7): qty 1

## 2013-12-28 MED ORDER — SODIUM CHLORIDE 0.9 % IV BOLUS (SEPSIS)
250.0000 mL | Freq: Once | INTRAVENOUS | Status: AC
  Administered 2013-12-28: 250 mL via INTRAVENOUS

## 2013-12-28 MED ORDER — BUDESONIDE-FORMOTEROL FUMARATE 160-4.5 MCG/ACT IN AERO
2.0000 | INHALATION_SPRAY | Freq: Two times a day (BID) | RESPIRATORY_TRACT | Status: DC
  Administered 2013-12-28: 2 via RESPIRATORY_TRACT
  Filled 2013-12-28: qty 6

## 2013-12-28 MED ORDER — DILTIAZEM HCL ER COATED BEADS 240 MG PO CP24
240.0000 mg | ORAL_CAPSULE | Freq: Every day | ORAL | Status: DC
  Administered 2013-12-28 – 2014-01-01 (×5): 240 mg via ORAL
  Filled 2013-12-28 (×5): qty 1

## 2013-12-28 MED ORDER — ATORVASTATIN CALCIUM 20 MG PO TABS
20.0000 mg | ORAL_TABLET | Freq: Every morning | ORAL | Status: DC
  Administered 2013-12-28 – 2014-01-02 (×6): 20 mg via ORAL
  Filled 2013-12-28 (×6): qty 1

## 2013-12-28 MED ORDER — BUDESONIDE 0.25 MG/2ML IN SUSP
0.2500 mg | Freq: Two times a day (BID) | RESPIRATORY_TRACT | Status: DC
  Administered 2013-12-28 – 2014-01-02 (×11): 0.25 mg via RESPIRATORY_TRACT
  Filled 2013-12-28 (×13): qty 2

## 2013-12-28 MED ORDER — LORATADINE 10 MG PO TABS
10.0000 mg | ORAL_TABLET | Freq: Every day | ORAL | Status: DC
  Administered 2013-12-28 – 2014-01-02 (×6): 10 mg via ORAL
  Filled 2013-12-28 (×6): qty 1

## 2013-12-28 MED ORDER — IPRATROPIUM BROMIDE 0.02 % IN SOLN
0.5000 mg | Freq: Four times a day (QID) | RESPIRATORY_TRACT | Status: DC
  Administered 2013-12-28 (×4): 0.5 mg via RESPIRATORY_TRACT
  Filled 2013-12-28 (×4): qty 2.5

## 2013-12-28 MED ORDER — INSULIN ASPART 100 UNIT/ML ~~LOC~~ SOLN
0.0000 [IU] | SUBCUTANEOUS | Status: DC
Start: 2013-12-28 — End: 2013-12-28

## 2013-12-28 MED ORDER — WARFARIN - PHARMACIST DOSING INPATIENT
Freq: Every day | Status: DC
  Administered 2013-12-30: 1
  Administered 2014-01-01: 18:00:00

## 2013-12-28 MED ORDER — IPRATROPIUM BROMIDE 0.02 % IN SOLN
0.5000 mg | Freq: Two times a day (BID) | RESPIRATORY_TRACT | Status: DC
  Administered 2013-12-29 – 2014-01-02 (×9): 0.5 mg via RESPIRATORY_TRACT
  Filled 2013-12-28 (×10): qty 2.5

## 2013-12-28 MED ORDER — FLUTICASONE PROPIONATE 50 MCG/ACT NA SUSP
1.0000 | Freq: Every day | NASAL | Status: DC
  Administered 2013-12-28 – 2014-01-02 (×5): 1 via NASAL
  Filled 2013-12-28: qty 16

## 2013-12-28 MED ORDER — GUAIFENESIN ER 600 MG PO TB12
600.0000 mg | ORAL_TABLET | Freq: Two times a day (BID) | ORAL | Status: DC
  Administered 2013-12-28 – 2014-01-02 (×12): 600 mg via ORAL
  Filled 2013-12-28 (×13): qty 1

## 2013-12-28 MED ORDER — CEFTRIAXONE SODIUM 1 G IJ SOLR
1.0000 g | Freq: Once | INTRAMUSCULAR | Status: AC
  Administered 2013-12-28: 1 g via INTRAVENOUS
  Filled 2013-12-28: qty 10

## 2013-12-28 MED ORDER — SODIUM CHLORIDE 0.9 % IV SOLN
INTRAVENOUS | Status: AC
  Administered 2013-12-28: 03:00:00 via INTRAVENOUS

## 2013-12-28 MED ORDER — DEXTROSE 5 % IV SOLN
1.0000 g | INTRAVENOUS | Status: DC
  Administered 2013-12-28 – 2013-12-31 (×4): 1 g via INTRAVENOUS
  Filled 2013-12-28 (×5): qty 10

## 2013-12-28 MED ORDER — METHYLPREDNISOLONE SODIUM SUCC 40 MG IJ SOLR
40.0000 mg | Freq: Two times a day (BID) | INTRAMUSCULAR | Status: DC
  Administered 2013-12-28 – 2013-12-29 (×3): 40 mg via INTRAVENOUS
  Filled 2013-12-28 (×5): qty 1

## 2013-12-28 MED ORDER — AZITHROMYCIN 500 MG IV SOLR
500.0000 mg | Freq: Once | INTRAVENOUS | Status: AC
  Administered 2013-12-28: 500 mg via INTRAVENOUS
  Filled 2013-12-28: qty 500

## 2013-12-28 MED ORDER — OFF THE BEAT BOOK
Freq: Once | Status: AC
  Administered 2013-12-28: 1
  Filled 2013-12-28: qty 1

## 2013-12-28 NOTE — H&P (Signed)
PCP:  Kandice Hams, MD  Card[facility]  Chief Complaint:  Cough, fatigue shortness of breath  HPI: Justin Garner is a 80 y.o. male   has a past medical history of Diabetes mellitus; Thyroid disease; Coronary artery disease; COPD (chronic obstructive pulmonary disease); GERD (gastroesophageal reflux disease); Pulmonary embolism; Hyperlipidemia; FH: colonic polyps; PAF (paroxysmal atrial fibrillation); Hypertension; ASCVD (arteriosclerotic cardiovascular disease); and Cancer.   Presented with  1 week hx of chills, cough productive of yellow sputum once was a bit pink. He went to pulmonology 2 days ago and was told he likely has post nasal drip. He was started on Zertec with no improvement. Today patient developed fatigue, lethargy and worsening cough he was found to have WBC 19, fever 100.2 and A.fib w RVR up to 120 now improved after a dose of diltiazem IV. Patient have not been eating or drinking well. CXR was worrisome for PNA.   Hospitalist was called for admission for  CAP and sepsis  Review of Systems:    Pertinent positives include:  Fevers, chills, fatigue, productive cough, shortness of breath at rest. excess mucus,  Constitutional:  No weight loss, night sweats, weight loss  HEENT:  No headaches, Difficulty swallowing,Tooth/dental problems,Sore throat,  No sneezing, itching, ear ache, nasal congestion, post nasal drip,  Cardio-vascular:  No chest pain, Orthopnea, PND, anasarca, dizziness, palpitations.no Bilateral lower extremity swelling  GI:  No heartburn, indigestion, abdominal pain, nausea, vomiting, diarrhea, change in bowel habits, loss of appetite, melena, blood in stool, hematemesis Resp:  no  No dyspnea on exertion, No  no  No non-productive cough, No coughing up of blood.No change in color of mucus. No wheezing. Skin:  no rash or lesions. No jaundice GU:  no dysuria, change in color of urine, no urgency or frequency. No straining to urinate.  No  flank pain.  Musculoskeletal:  No joint pain or no joint swelling. No decreased range of motion. No back pain.  Psych:  No change in mood or affect. No depression or anxiety. No memory loss.  Neuro: no localizing neurological complaints, no tingling, no weakness, no double vision, no gait abnormality, no slurred speech, no confusion  Otherwise ROS are negative except for above, 10 systems were reviewed  Past Medical History: Past Medical History  Diagnosis Date  . Diabetes mellitus   . Thyroid disease   . Coronary artery disease   . COPD (chronic obstructive pulmonary disease)   . GERD (gastroesophageal reflux disease)   . Pulmonary embolism   . Hyperlipidemia   . FH: colonic polyps   . PAF (paroxysmal atrial fibrillation)   . Hypertension   . ASCVD (arteriosclerotic cardiovascular disease)   . Cancer    Past Surgical History  Procedure Laterality Date  . Cholecystectomy    . Hernia repair    . Carotid stent    . Stent proximal    . Patent stents    . Inguinal hernia repair  10/10/2010  . Fiberoptic bronchoscopy and meiastinoscopy  06/07/2007  . R vats,thoracotomy and upper lobectomy  08/02/2007     Medications: Prior to Admission medications   Medication Sig Start Date End Date Taking? Authorizing Provider  atorvastatin (LIPITOR) 20 MG tablet Take 20 mg by mouth every morning.   Yes Historical Provider, MD  budesonide-formoterol (SYMBICORT) 160-4.5 MCG/ACT inhaler Inhale 2 puffs into the lungs 2 (two) times daily.    Yes Historical Provider, MD  diltiazem (CARDIZEM CD) 240 MG 24 hr capsule Take 240 mg by  mouth daily.   Yes Historical Provider, MD  fenofibrate micronized (LOFIBRA) 67 MG capsule Take 67 mg by mouth daily before breakfast.   Yes Historical Provider, MD  fish oil-omega-3 fatty acids 1000 MG capsule Take 1 g by mouth 2 (two) times daily.    Yes Historical Provider, MD  furosemide (LASIX) 20 MG tablet Take 20 mg by mouth daily.  08/01/13  Yes Historical  Provider, MD  glipiZIDE (GLUCOTROL XL) 10 MG 24 hr tablet Take 10 mg by mouth daily with breakfast.    Yes Historical Provider, MD  JANUVIA 50 MG tablet Take 50 mg by mouth every morning.  07/21/11  Yes Historical Provider, MD  levothyroxine (SYNTHROID, LEVOTHROID) 112 MCG tablet Take 112 mcg by mouth daily.     Yes Historical Provider, MD  nitroGLYCERIN (NITROSTAT) 0.4 MG SL tablet Place 0.4 mg under the tongue every 5 (five) minutes as needed for chest pain.   Yes Historical Provider, MD  omeprazole (PRILOSEC) 20 MG capsule Take 20 mg by mouth daily.     Yes Historical Provider, MD  warfarin (COUMADIN) 5 MG tablet Take 5-7.5 mg by mouth daily. Take 5mg  on Sun, Tues, Thur, Sat Take 7.5mg  on Mon, Wed, & Fri.   Yes Historical Provider, MD    Allergies:  No Known Allergies  Social History:  Ambulatory  independently   Lives at home  With family     reports that he quit smoking about 12 years ago. His smoking use included Cigarettes. He has a 50 pack-year smoking history. He has quit using smokeless tobacco. He reports that he does not drink alcohol or use illicit drugs.    Family History: family history includes Heart disease in his father.    Physical Exam: Patient Vitals for the past 24 hrs:  BP Temp Temp src Pulse Resp SpO2 Height Weight  12/28/13 0002 104/72 mmHg - - 63 19 93 % - -  12/27/13 2315 114/72 mmHg - - 66 17 94 % - -  12/27/13 2259 - 100.2 F (37.9 C) Rectal - - - - -  12/27/13 2245 125/93 mmHg - - 118 19 96 % - -  12/27/13 2231 - - - - - 92 % - -  12/27/13 2208 124/75 mmHg - - 111 33 94 % 6\' 1"  (1.854 m) 89.812 kg (198 lb)  12/27/13 2155 116/64 mmHg 98.1 F (36.7 C) Oral 118 21 95 % - -  12/27/13 2135 108/67 mmHg 99.1 F (37.3 C) Oral 77 18 91 % 6\' 1"  (1.854 m) 89.812 kg (198 lb)    1. General:  in No Acute distress 2. Psychological: Alert and   Oriented 3. Head/ENT:   Moist   Mucous Membranes                          Head Non traumatic, neck supple                           adentulous 4. SKIN:  decreased Skin turgor,  Skin clean Dry and intact no rash 5. Heart: Regular rate and rhythm no Murmur, Rub or gallop 6. Lungs:  no wheezes coarse  crackles  Through out 7. Abdomen: Soft, non-tender, Non distended 8. Lower extremities: no clubbing, cyanosis, or edema 9. Neurologically Grossly intact, moving all 4 extremities equally 10. MSK: Normal range of motion  body mass index is 26.13 kg/(m^2).   Labs on Admission:  Recent Labs  12/27/13 2141  NA 140  K 3.0*  CL 98  CO2 25  GLUCOSE 119*  BUN 25*  CREATININE 1.47*  CALCIUM 8.7    Recent Labs  12/27/13 2141  AST 44*  ALT 40  ALKPHOS 169*  BILITOT 3.1*  PROT 7.0  ALBUMIN 3.1*   No results found for this basename: LIPASE, AMYLASE,  in the last 72 hours  Recent Labs  12/27/13 2141  WBC 19.2*  NEUTROABS 16.6*  HGB 15.9  HCT 44.2  MCV 87.5  PLT 322   No results found for this basename: CKTOTAL, CKMB, CKMBINDEX, TROPONINI,  in the last 72 hours No results found for this basename: TSH, T4TOTAL, FREET3, T3FREE, THYROIDAB,  in the last 72 hours No results found for this basename: VITAMINB12, FOLATE, FERRITIN, TIBC, IRON, RETICCTPCT,  in the last 72 hours No results found for this basename: HGBA1C    Estimated Creatinine Clearance: 46 ml/min (by C-G formula based on Cr of 1.47). ABG    Component Value Date/Time   PHART 7.436 08/03/2007 0520   HCO3 21.0 08/03/2007 0520   TCO2 22 08/03/2007 0520   ACIDBASEDEF 3.0* 08/03/2007 0520   O2SAT 93.0 08/03/2007 0520     No results found for this basename: DDIMER     Other results:  I have pearsonaly reviewed this: ECG REPORT  Rate: 132  Rhythm: a.fib w RVR ST&T Change: no ischemia but frequent PVC   UA  3-6 WBC positive Nitrites, few bacteria  BNP (last 3 results)  Recent Labs  12/27/13 2141  PROBNP 1235.0*    Filed Weights   12/27/13 2135 12/27/13 2208  Weight: 89.812 kg (198 lb) 89.812 kg (198 lb)      Cultures:    Component Value Date/Time   SDES PLEURAL FLUID RIGHT 07/03/2013 1400   SPECREQUEST 60ML FLUID 07/03/2013 1400   CULT  Value: NO GROWTH 3 DAYS Performed at [facility] 07/03/2013 1400   REPTSTATUS 07/06/2013 FINAL 07/03/2013 1400      Radiological Exams on Admission: Dg Chest 2 View  12/28/2013   CLINICAL DATA:  Acute onset of weakness and shortness of breath, worsening over the past week. Initial encounter.  EXAM: CHEST  2 VIEW  COMPARISON:  Chest radiograph from 10/28/2013  FINDINGS: The lungs are well-aerated. There appears to be acute focal airspace consolidation at the left lung base, concerning for pneumonia. Underlying scarring is again noted at the right lung base and left lung apex, with underlying emphysematous change. No pleural effusion or pneumothorax is seen.  The heart is borderline enlarged. No acute osseous abnormalities are seen.  IMPRESSION: 1. Acute focal airspace consolidation at the left lung base, concerning for pneumonia. 2. Underlying chronic lung changes noted, with bilateral emphysema. Borderline cardiomegaly.   Electronically Signed   By: Garald Balding M.D.   On: 12/28/2013 00:20   Echo 05/15 EF 40-45%  Left ventricle: LVEF Is approximately 45% with inferior hypokinesis/akinesis The cavity size was normal. Wall thickness was increased in a pattern of mild LVH. - Left atrium: The atrium was moderately to severely dilated. - Right ventricle: The cavity size was mildly dilated. - Right atrium: The atrium was moderately to severely dilated. - Pulmonary arteries: PA peak pressure: 3mm Hg (S).    Chart has been reviewed  Assessment/Plan  80 yo M with hx of A.fib, CHF, COPD here with sepsis and CAP  Present on Admission:  . Sepsis - tachycardia, and low grade fever. Will monitor  on telemetry, IVF, IV antibiotics,  . Atrial fibrillation w RVR - responded initially to diltiazem IV bolus, appears dehydrated, will give IVF being mindful  of CHF, mentating well . Coronary atherosclerosis of native coronary artery cycle CE . Essential hypertension, benign - hold BP meds given soft blood pressure . CAP (community acquired pneumonia) - rocephin and azithro IV . Hypokalemia - replaced in ER will repeat in AM Diabetes Melitus - SSI hold po meds   Prophylaxis: coumadin, Protonix  CODE STATUS:  FULL CODE  As per patient  Other plan as per orders.  I have spent a total of 55 min on this admission  Malcom Selmer 12/28/2013, 12:36 AM  [facility]  Pager [phone]   after 2 AM please page floor coverage PA If 7AM-7PM, please contact the day team taking care of the patient  [email or web address]  Password [email or web address]

## 2013-12-28 NOTE — Progress Notes (Signed)
Patient alert and oriented x4 throughout shift, vital signs stable.  Wife, children, and grandchildren at bedside throughout shift.  Vital signs stable.  Patient had unmeasured void in bathroom this afternoon; instructed to use either urinal or hat in toilet in future so that intake and output can be measured accurately. Patient and wife voice understanding of information.  Patient and family deny any questions or concerns at this time. Will continue to monitor.

## 2013-12-28 NOTE — Plan of Care (Signed)
Problem: Phase I Progression Outcomes Goal: First antibiotic given within 6hrs of admit Outcome: Completed/Met Date Met:  12/28/13 Medication administered in ED

## 2013-12-28 NOTE — ED Notes (Signed)
Transporting patient to new room assignment. 

## 2013-12-28 NOTE — Progress Notes (Signed)
ANTICOAGULATION CONSULT NOTE - Initial Consult  Pharmacy Consult for Coumadin Indication: atrial fibrillation and h/o PE  No Known Allergies  Patient Measurements: Height: 6\' 1"  (185.4 cm) Weight: 198 lb (89.812 kg) IBW/kg (Calculated) : 79.9  Vital Signs: Temp: 100.2 F (37.9 C) (10/10 2259) Temp Source: Rectal (10/10 2259) BP: 101/75 mmHg (10/11 0125) Pulse Rate: 103 (10/11 0030)  Labs:  Recent Labs  12/27/13 2141  HGB 15.9  HCT 44.2  PLT 322  APTT 45*  LABPROT 36.2*  INR 3.64*  CREATININE 1.47*    Estimated Creatinine Clearance: 46 ml/min (by C-G formula based on Cr of 1.47).   Medical History: Past Medical History  Diagnosis Date  . Diabetes mellitus   . Thyroid disease   . Coronary artery disease   . COPD (chronic obstructive pulmonary disease)   . GERD (gastroesophageal reflux disease)   . Pulmonary embolism   . Hyperlipidemia   . FH: colonic polyps   . PAF (paroxysmal atrial fibrillation)   . Hypertension   . ASCVD (arteriosclerotic cardiovascular disease)   . Cancer     Medications:  Prescriptions prior to admission  Medication Sig Dispense Refill  . atorvastatin (LIPITOR) 20 MG tablet Take 20 mg by mouth every morning.      . budesonide-formoterol (SYMBICORT) 160-4.5 MCG/ACT inhaler Inhale 2 puffs into the lungs 2 (two) times daily.       Marland Kitchen diltiazem (CARDIZEM CD) 240 MG 24 hr capsule Take 240 mg by mouth daily.      . fenofibrate micronized (LOFIBRA) 67 MG capsule Take 67 mg by mouth daily before breakfast.      . fish oil-omega-3 fatty acids 1000 MG capsule Take 1 g by mouth 2 (two) times daily.       . furosemide (LASIX) 20 MG tablet Take 20 mg by mouth daily.       Marland Kitchen glipiZIDE (GLUCOTROL XL) 10 MG 24 hr tablet Take 10 mg by mouth daily with breakfast.       . JANUVIA 50 MG tablet Take 50 mg by mouth every morning.       Marland Kitchen levothyroxine (SYNTHROID, LEVOTHROID) 112 MCG tablet Take 112 mcg by mouth daily.        . nitroGLYCERIN (NITROSTAT)  0.4 MG SL tablet Place 0.4 mg under the tongue every 5 (five) minutes as needed for chest pain.      Marland Kitchen omeprazole (PRILOSEC) 20 MG capsule Take 20 mg by mouth daily.        Marland Kitchen warfarin (COUMADIN) 5 MG tablet Take 5-7.5 mg by mouth daily. Take 5mg  on Sun, Tues, Thur, Sat Take 7.5mg  on Mon, Wed, & Fri.       Scheduled:  . atorvastatin  20 mg Oral q morning - 10a  . [START ON 12/29/2013] azithromycin  500 mg Intravenous Q24H  . budesonide-formoterol  2 puff Inhalation BID  . cefTRIAXone (ROCEPHIN)  IV  1 g Intravenous Q24H  . diltiazem  240 mg Oral Daily  . guaiFENesin  600 mg Oral BID  . insulin aspart  0-9 Units Subcutaneous 6 times per day  . ipratropium  0.5 mg Nebulization Q6H  . levothyroxine  112 mcg Oral QAC breakfast  . pantoprazole  40 mg Oral Daily  . sodium chloride  250 mL Intravenous Once   Infusions:  . sodium chloride      Assessment: 80yo male c/o weakness, in Afib in ED, admitted w/ sepsis, to continue Coumadin for Afib/PE; current INR above goal, last  dose PTA 10/10.  Goal of Therapy:  INR 2-3   Plan:  Will hold Coumadin for now and monitor INR for dose adjustments.  Wynona Neat, PharmD, BCPS  12/28/2013,1:53 AM

## 2013-12-28 NOTE — Progress Notes (Addendum)
Patient seen and examined. Admitted after midnight secondary to sepsis due to CAP. Patient is very rhonchus/coarse on auscultation. Denies CP currently. Low grade fever overnight. Having mild difficulty breathing, positive wheezing and requiring oxygen supplementation. Please referred to H&P done by Dr. Roel Cluck for further info/details on admission.  Plan: -continue IV antibiotics for CAP -holding diuretics today and continue gentle hydration overnight -add flutter valve and pulmicort -will follow clinical response -will add solumedrol for COPD exacerbation   Barton Dubois [phone]

## 2013-12-28 NOTE — Progress Notes (Signed)
Patient's HR in 140s-150s when ambulating or eating.  HR returns to 110s-120s when at rest.  MD aware; will continue to monitor.

## 2013-12-28 NOTE — Progress Notes (Signed)
The patient arrived to 3E22 from the ED at 0200.  He was oriented to the unit and placed on telemetry.  CCMD was notified.  He is A&Ox4 and his VS are stable.  He does not have any complaints of pain at this time.  He is currently in A. Fib on the monitor and he has dsypnea on exertion.  Rhonchi can be heard in his bilateral lung bases and his O2 sats are in the lower 90s.  He was placed on 2L of O2 via N/C for comfort and was also placed on a continuous pulse ox.  He was given a bolus of 250 mL NS as ordered.  His bed alarm was turned on and his call bell was explained and placed within reach.  His wife is at the bedside.

## 2013-12-28 NOTE — Progress Notes (Signed)
Pharmacist Heart Failure Core Measure Documentation  Assessment: Justin Garner has an EF documented as 45% on 07/18/13 by 2D Echo.  Rationale: Heart failure patients with left ventricular systolic dysfunction (LVSD) and an EF < 40% should be prescribed an angiotensin converting enzyme inhibitor (ACEI) or angiotensin receptor blocker (ARB) at discharge unless a contraindication is documented in the medical record.  This patient is not currently on an ACEI or ARB for HF.  This note is being placed in the record in order to provide documentation that a contraindication to the use of these agents is present for this encounter.  ACE Inhibitor or Angiotensin Receptor Blocker is contraindicated (specify all that apply)  []   ACEI allergy AND ARB allergy []   Angioedema []   Moderate or severe aortic stenosis []   Hyperkalemia [x]   Hypotension []   Renal artery stenosis []   Worsening renal function, preexisting renal disease or dysfunction  Nicole Cella, RPh Clinical Pharmacist Pager: [phone]  12/28/2013 10:05 PM

## 2013-12-28 NOTE — ED Provider Notes (Signed)
Medical screening examination/treatment/procedure(s) were conducted as a shared visit with non-physician practitioner(s) and myself.  I personally evaluated the patient during the encounter  Please see my separate respective documentation pertaining to this patient encounter   Johnna Acosta, MD 12/28/13 1031

## 2013-12-29 DIAGNOSIS — E43 Unspecified severe protein-calorie malnutrition: Secondary | ICD-10-CM

## 2013-12-29 DIAGNOSIS — E1122 Type 2 diabetes mellitus with diabetic chronic kidney disease: Secondary | ICD-10-CM

## 2013-12-29 DIAGNOSIS — I482 Chronic atrial fibrillation: Secondary | ICD-10-CM

## 2013-12-29 DIAGNOSIS — N182 Chronic kidney disease, stage 2 (mild): Secondary | ICD-10-CM

## 2013-12-29 LAB — LEGIONELLA ANTIGEN, URINE

## 2013-12-29 LAB — GLUCOSE, CAPILLARY
Glucose-Capillary: 275 mg/dL — ABNORMAL HIGH (ref 70–99)
Glucose-Capillary: 209 mg/dL — ABNORMAL HIGH (ref 70–99)
Glucose-Capillary: 234 mg/dL — ABNORMAL HIGH (ref 70–99)
Glucose-Capillary: 228 mg/dL — ABNORMAL HIGH (ref 70–99)

## 2013-12-29 LAB — PROTIME-INR
INR: 3.12 — ABNORMAL HIGH (ref 0.00–1.49)
Prothrombin Time: 32.1 seconds — ABNORMAL HIGH (ref 11.6–15.2)

## 2013-12-29 MED ORDER — WARFARIN SODIUM 2.5 MG PO TABS
2.5000 mg | ORAL_TABLET | Freq: Once | ORAL | Status: AC
  Administered 2013-12-29: 2.5 mg via ORAL
  Filled 2013-12-29: qty 1

## 2013-12-29 MED ORDER — FUROSEMIDE 20 MG PO TABS
20.0000 mg | ORAL_TABLET | Freq: Every day | ORAL | Status: DC
  Administered 2013-12-29 – 2014-01-02 (×5): 20 mg via ORAL
  Filled 2013-12-29 (×5): qty 1

## 2013-12-29 MED ORDER — ASPIRIN 81 MG PO CHEW
81.0000 mg | CHEWABLE_TABLET | Freq: Every day | ORAL | Status: DC
  Administered 2013-12-29 – 2014-01-02 (×5): 81 mg via ORAL
  Filled 2013-12-29 (×5): qty 1

## 2013-12-29 MED ORDER — METHYLPREDNISOLONE SODIUM SUCC 40 MG IJ SOLR
40.0000 mg | Freq: Two times a day (BID) | INTRAMUSCULAR | Status: DC
  Administered 2013-12-30: 40 mg via INTRAVENOUS
  Filled 2013-12-29 (×3): qty 1

## 2013-12-29 MED ORDER — MAGNESIUM SULFATE 40 MG/ML IJ SOLN
2.0000 g | Freq: Once | INTRAMUSCULAR | Status: AC
  Administered 2013-12-29: 2 g via INTRAVENOUS
  Filled 2013-12-29: qty 50

## 2013-12-29 MED ORDER — INSULIN DETEMIR 100 UNIT/ML ~~LOC~~ SOLN
10.0000 [IU] | Freq: Every day | SUBCUTANEOUS | Status: DC
  Administered 2013-12-29 – 2014-01-01 (×4): 10 [IU] via SUBCUTANEOUS
  Filled 2013-12-29 (×5): qty 0.1

## 2013-12-29 NOTE — Progress Notes (Signed)
[place] for Coumadin Indication: atrial fibrillation and h/o PE  No Known Allergies  Patient Measurements: Height: 6\' 1"  (185.4 cm) Weight: 191 lb 4.8 oz (86.773 kg) (b scale) IBW/kg (Calculated) : 79.9  Vital Signs: Temp: 97.5 F (36.4 C) (10/12 1000) Temp Source: Oral (10/12 1000) BP: 115/82 mmHg (10/12 1000) Pulse Rate: 91 (10/12 1000)  Labs:  Recent Labs  12/27/13 2141 12/28/13 0406 12/28/13 0900 12/28/13 1620 12/29/13 0333  HGB 15.9 14.3  --   --   --   HCT 44.2 41.2  --   --   --   PLT 322 323  --   --   --   APTT 45*  --   --   --   --   LABPROT 36.2*  --   --   --  32.1*  INR 3.64*  --   --   --  3.12*  CREATININE 1.47* 1.32  --   --   --   TROPONINI  --  <0.30 <0.30 <0.30  --     Estimated Creatinine Clearance: 51.3 ml/min (by C-G formula based on Cr of 1.32).   Medical History: Past Medical History  Diagnosis Date  . Diabetes mellitus   . Thyroid disease   . Coronary artery disease   . COPD (chronic obstructive pulmonary disease)   . GERD (gastroesophageal reflux disease)   . Pulmonary embolism   . Hyperlipidemia   . FH: colonic polyps   . PAF (paroxysmal atrial fibrillation)   . Hypertension   . ASCVD (arteriosclerotic cardiovascular disease)   . Cancer     Medications:  Prescriptions prior to admission  Medication Sig Dispense Refill  . atorvastatin (LIPITOR) 20 MG tablet Take 20 mg by mouth every morning.      . budesonide-formoterol (SYMBICORT) 160-4.5 MCG/ACT inhaler Inhale 2 puffs into the lungs 2 (two) times daily.       Marland Kitchen diltiazem (CARDIZEM CD) 240 MG 24 hr capsule Take 240 mg by mouth daily.      . fenofibrate micronized (LOFIBRA) 67 MG capsule Take 67 mg by mouth daily before breakfast.      . fish oil-omega-3 fatty acids 1000 MG capsule Take 1 g by mouth 2 (two) times daily.       . furosemide (LASIX) 20 MG tablet Take 20 mg by mouth daily.       Marland Kitchen glipiZIDE (GLUCOTROL XL) 10 MG 24 hr  tablet Take 10 mg by mouth daily with breakfast.       . JANUVIA 50 MG tablet Take 50 mg by mouth every morning.       Marland Kitchen levothyroxine (SYNTHROID, LEVOTHROID) 112 MCG tablet Take 112 mcg by mouth daily.        . nitroGLYCERIN (NITROSTAT) 0.4 MG SL tablet Place 0.4 mg under the tongue every 5 (five) minutes as needed for chest pain.      Marland Kitchen omeprazole (PRILOSEC) 20 MG capsule Take 20 mg by mouth daily.        Marland Kitchen warfarin (COUMADIN) 5 MG tablet Take 5-7.5 mg by mouth daily. Take 5mg  on Sun, Tues, Thur, Sat Take 7.5mg  on Mon, Wed, & Fri.       Scheduled:  . atorvastatin  20 mg Oral q morning - 10a  . azithromycin  500 mg Intravenous Q24H  . budesonide (PULMICORT) nebulizer solution  0.25 mg Nebulization BID  . cefTRIAXone (ROCEPHIN)  IV  1 g Intravenous Q24H  . diltiazem  240 mg Oral Daily  . fluticasone  1 spray Each Nare Daily  . guaiFENesin  600 mg Oral BID  . insulin aspart  0-9 Units Subcutaneous TID WC  . ipratropium  0.5 mg Nebulization BID  . levothyroxine  112 mcg Oral QAC breakfast  . loratadine  10 mg Oral Daily  . methylPREDNISolone (SOLU-MEDROL) injection  40 mg Intravenous Q12H  . pantoprazole  40 mg Oral Daily  . potassium chloride  40 mEq Oral Daily  . Warfarin - Pharmacist Dosing Inpatient   Does not apply q1800   Infusions:     Assessment: 80yo male c/o weakness, in Afib in ED, admitted w/ sepsis, to continue Coumadin for Afib/PE; current INR remains slightly above goal but last dose PTA 10/10. Concern INR will fall too low if we continue to hold coumadin today  Goal of Therapy:  INR 2-3   Plan:  Coumadin 2.5 mg po today Daily PT/INR  Thanks for allowing pharmacy to be a part of this patient's care.  Excell Seltzer, PharmD Clinical Pharmacist, [phone] 12/29/2013,10:31 AM

## 2013-12-29 NOTE — Progress Notes (Signed)
INITIAL NUTRITION ASSESSMENT  DOCUMENTATION CODES Per approved criteria  -Severe malnutrition in the context of chronic illness   Pt meets criteria for severe MALNUTRITION in the context of chronic illness as evidenced by severe fat and muscle depletion, ,7% of estimated energy intake x 1 month, 6.8% wt loss x 1 month.  INTERVENTION: Glucerna Shake po TID, each supplement provides 220 kcal and 10 grams of protein  NUTRITION DIAGNOSIS: Inadequate oral intake related to decreased appetite as evidenced by poor PO intake PTA, severe fat and muscle depeltion.   Goal: Pt will meet >90% of estimated nutritional needs  Monitor:  PO/supplement intake, labs, weight changes, I/O's  Reason for Assessment: MST=3  80 y.o. male  Admitting Dx: <principal problem not specified>  CAYDYN SPRUNG is a 80 y.o. male has a past medical history of Diabetes mellitus; Thyroid disease; Coronary artery disease; COPD (chronic obstructive pulmonary disease); GERD (gastroesophageal reflux disease); Pulmonary embolism; Hyperlipidemia; FH: colonic polyps; PAF (paroxysmal atrial fibrillation); Hypertension; ASCVD (arteriosclerotic cardiovascular disease); and Cancer. Presented with 1 week hx of chills, cough productive of yellow sputum once was a bit pink. CXR worrisome for pneumonia.   ASSESSMENT: Pt confirms poor appetite and weight loss, due to lack of appetite. He reveals be has had a "terrib;e" appetite PTA for about 4 weeks PTA, admitting her has been eating very little. However, he reports feeling better since being hospitalized and his appetite has improved since being admitted; PO: 90-100%. Home diet is balanced and his wife does all the meal preparation. He denies any problems chewing or swallowing.  Pt reports UBW of 205#, which he last weighed one month ago, which reflects a 14# (6.8%) wt loss. This is fairly consistent with documented wt hx.  Educated pt on importance of good PO intake to promote  healing process. Pt is agreeable to drink supplement, prefers vanilla flavor. Labs reviewed. K: 3.1 (on supplementation), Mg: 1.2, BUN: 24, Calcium: 8.2, CBGs: 91-275. Hgb A1c WDL. Expect high blood sugars due to initiation of steroids.   Nutrition Focused Physical Exam:  Subcutaneous Fat:  Orbital Region: severe depletion Upper Arm Region: mild depletion Thoracic and Lumbar Region: mild depletion  Muscle:  Temple Region: severe depletion Clavicle Bone Region: severe depletion Clavicle and Acromion Bone Region: severe depletion Scapular Bone Region: severe depletion Dorsal Hand: WDL Patellar Region: mild depletion Anterior Thigh Region: mild depletion Posterior Calf Region: mild depletion  Edema: none present  Height: Ht Readings from Last 1 Encounters:  12/28/13 6\' 1"  (1.854 m)    Weight: Wt Readings from Last 1 Encounters:  12/29/13 191 lb 4.8 oz (86.773 kg)    Ideal Body Weight: 184#  % Ideal Body Weight: 104%  Wt Readings from Last 10 Encounters:  12/29/13 191 lb 4.8 oz (86.773 kg)  12/24/13 198 lb 3.2 oz (89.903 kg)  10/28/13 203 lb 12.8 oz (92.443 kg)  08/19/13 204 lb (92.534 kg)  08/19/13 204 lb (92.534 kg)  08/12/13 203 lb (92.08 kg)  06/30/13 217 lb (98.431 kg)  06/24/13 210 lb (95.255 kg)  05/28/13 214 lb (97.07 kg)  10/28/12 216 lb 12.8 oz (98.34 kg)    Usual Body Weight: 205#  % Usual Body Weight: 93%  BMI:  Body mass index is 25.24 kg/(m^2). Overweight  Estimated Nutritional Needs: Kcal: 2100-2300 Protein: 94-104 grams Fluid: 2.1-2.3 L  Skin: Intact  Diet Order: Carb Control  EDUCATION NEEDS: -Education needs addressed   Intake/Output Summary (Last 24 hours) at 12/29/13 0943 Last data filed at  12/29/13 0629  Gross per 24 hour  Intake 1425.83 ml  Output    475 ml  Net 950.83 ml    Last BM: 12/27/13  Labs:   Recent Labs Lab 12/27/13 2141 12/28/13 0406 12/28/13 0900  NA 140 139  --   K 3.0* 3.1*  --   CL 98 99  --    CO2 25 22  --   BUN 25* 24*  --   CREATININE 1.47* 1.32  --   CALCIUM 8.7 8.2*  --   MG  --   --  1.2*  GLUCOSE 119* 94  --     CBG (last 3)   Recent Labs  12/28/13 1642 12/28/13 2048 12/29/13 0553  GLUCAP 91 208* 275*   Lab Results  Component Value Date   HGBA1C 6.4* 12/28/2013   Scheduled Meds: . atorvastatin  20 mg Oral q morning - 10a  . azithromycin  500 mg Intravenous Q24H  . budesonide (PULMICORT) nebulizer solution  0.25 mg Nebulization BID  . cefTRIAXone (ROCEPHIN)  IV  1 g Intravenous Q24H  . diltiazem  240 mg Oral Daily  . fluticasone  1 spray Each Nare Daily  . guaiFENesin  600 mg Oral BID  . insulin aspart  0-9 Units Subcutaneous TID WC  . ipratropium  0.5 mg Nebulization BID  . levothyroxine  112 mcg Oral QAC breakfast  . loratadine  10 mg Oral Daily  . methylPREDNISolone (SOLU-MEDROL) injection  40 mg Intravenous Q12H  . pantoprazole  40 mg Oral Daily  . potassium chloride  40 mEq Oral Daily  . Warfarin - Pharmacist Dosing Inpatient   Does not apply q1800    Continuous Infusions:   Past Medical History  Diagnosis Date  . Diabetes mellitus   . Thyroid disease   . Coronary artery disease   . COPD (chronic obstructive pulmonary disease)   . GERD (gastroesophageal reflux disease)   . Pulmonary embolism   . Hyperlipidemia   . FH: colonic polyps   . PAF (paroxysmal atrial fibrillation)   . Hypertension   . ASCVD (arteriosclerotic cardiovascular disease)   . Cancer     Past Surgical History  Procedure Laterality Date  . Cholecystectomy    . Hernia repair    . Carotid stent    . Stent proximal    . Patent stents    . Inguinal hernia repair  10/10/2010  . Fiberoptic bronchoscopy and meiastinoscopy  06/07/2007  . R vats,thoracotomy and upper lobectomy  08/02/2007    Caylon Saine A. Jimmye Norman, RD, LDN Pager: [phone] After hours Pager: [phone]

## 2013-12-29 NOTE — Progress Notes (Addendum)
[facility] PROGRESS NOTE  Justin Garner HKV:[id number] DOB: 1932-11-16 DOA: 12/27/2013 PCP: Kandice Hams, MD  Assessment/Plan: 1-acute resp failure with hypoxia: due to COPD exacerbation and CAP -continue IV antibiotics, nebulizer treatment, pulmicort, solumedrol and flutter valve -patient feeling better and responding to treatment -no fever and WBC's trending down  2-Hx of A. Fib with RVR: will continue diltiazem -HR much better now.  3-CAD: troponin neg X 3 -will continue ASA, statins -denies CP  4-HLD: continue statins  5-diabetes: continue SSI; given use of solumedrol will add low dose levemir -A1C 6.4  6-hypothyroidism: continue synthroid  7-allergic rhinitis: continue Claritin and flonase  8-GERD: continue PPI  9-protein calorie malnutrition: will follow nutrition service recommendations and start feeding supplements  Code Status: Full Family Communication: no family at bedside Disposition Plan: to be determine    Consultants:  None   Procedures:  See below for x-ray reports   Antibiotics:  Rocephin 10/11  zithromax 10/11  HPI/Subjective: Afebrile, denies CP. Patient is feeling better and breathing better. Still with coughing spells and wheezing  Objective: Filed Vitals:   12/29/13 1400  BP: 115/83  Pulse: 97  Temp: 97.4 F (36.3 C)  Resp: 18    Intake/Output Summary (Last 24 hours) at 12/29/13 1746 Last data filed at 12/29/13 1700  Gross per 24 hour  Intake   1570 ml  Output   1375 ml  Net    195 ml   Filed Weights   12/27/13 2208 12/28/13 0203 12/29/13 0542  Weight: 89.812 kg (198 lb) 85 kg (187 lb 6.3 oz) 86.773 kg (191 lb 4.8 oz)    Exam:   General:  Afebrile, feeling better and breathing easier. Still with some coughing spells, wheezing and requiring O2 supplementation   Cardiovascular: irregular, no rubs or gallops, no JVD  Respiratory: diffuse rhonchi, positive wheezing  Abdomen: soft, NT, ND, positive  BS  Musculoskeletal: no edema, no cyanosis   Data Reviewed: Basic Metabolic Panel:  Recent Labs Lab 12/27/13 2141 12/28/13 0406 12/28/13 0900  NA 140 139  --   K 3.0* 3.1*  --   CL 98 99  --   CO2 25 22  --   GLUCOSE 119* 94  --   BUN 25* 24*  --   CREATININE 1.47* 1.32  --   CALCIUM 8.7 8.2*  --   MG  --   --  1.2*   Liver Function Tests:  Recent Labs Lab 12/27/13 2141 12/28/13 0406  AST 44* 34  ALT 40 35  ALKPHOS 169* 149*  BILITOT 3.1* 2.6*  PROT 7.0 6.3  ALBUMIN 3.1* 2.7*   CBC:  Recent Labs Lab 12/27/13 2141 12/28/13 0406  WBC 19.2* 17.8*  NEUTROABS 16.6* 15.0*  HGB 15.9 14.3  HCT 44.2 41.2  MCV 87.5 90.0  PLT 322 323   Cardiac Enzymes:  Recent Labs Lab 12/28/13 0406 12/28/13 0900 12/28/13 1620  TROPONINI <0.30 <0.30 <0.30   BNP (last 3 results)  Recent Labs  12/27/13 2141  PROBNP 1235.0*   CBG:  Recent Labs Lab 12/28/13 1642 12/28/13 2048 12/29/13 0553 12/29/13 1104 12/29/13 1608  GLUCAP 91 208* 275* 209* 234*    Recent Results (from the past 240 hour(s))  CULTURE, BLOOD (ROUTINE X 2)     Status: None   Collection Time    12/28/13  4:06 AM      Result Value Ref Range Status   Specimen Description BLOOD LEFT HAND   Final   Special Requests  BOTTLES DRAWN AEROBIC AND ANAEROBIC 10CC EACH   Final   Culture  Setup Time     Final   Value: 12/28/2013 13:42     Performed at [facility]   Culture     Final   Value:        BLOOD CULTURE RECEIVED NO GROWTH TO DATE CULTURE WILL BE HELD FOR 5 DAYS BEFORE ISSUING A FINAL NEGATIVE REPORT     Note: Culture results may be compromised due to an excessive volume of blood received in culture bottles.     Performed at [facility]   Report Status PENDING   Incomplete  CULTURE, BLOOD (ROUTINE X 2)     Status: None   Collection Time    12/28/13  4:18 AM      Result Value Ref Range Status   Specimen Description BLOOD RIGHT ARM   Final   Special Requests BOTTLES DRAWN  AEROBIC AND ANAEROBIC 10CC EACH   Final   Culture  Setup Time     Final   Value: 12/28/2013 13:41     Performed at [facility]   Culture     Final   Value:        BLOOD CULTURE RECEIVED NO GROWTH TO DATE CULTURE WILL BE HELD FOR 5 DAYS BEFORE ISSUING A FINAL NEGATIVE REPORT     Note: Culture results may be compromised due to an excessive volume of blood received in culture bottles.     Performed at [facility]   Report Status PENDING   Incomplete  CULTURE, EXPECTORATED SPUTUM-ASSESSMENT     Status: None   Collection Time    12/28/13  1:47 PM      Result Value Ref Range Status   Specimen Description SPUTUM   Final   Special Requests Normal   Final   Sputum evaluation     Final   Value: THIS SPECIMEN IS ACCEPTABLE. RESPIRATORY CULTURE REPORT TO FOLLOW.   Report Status 12/28/2013 FINAL   Final  CULTURE, RESPIRATORY (NON-EXPECTORATED)     Status: None   Collection Time    12/28/13  1:47 PM      Result Value Ref Range Status   Specimen Description SPUTUM   Final   Special Requests NONE   Final   Gram Stain PENDING   Incomplete   Culture     Final   Value: Culture reincubated for better growth     Performed at [facility]   Report Status PENDING   Incomplete     Studies: Dg Chest 2 View  12/28/2013   CLINICAL DATA:  Acute onset of weakness and shortness of breath, worsening over the past week. Initial encounter.  EXAM: CHEST  2 VIEW  COMPARISON:  Chest radiograph from 10/28/2013  FINDINGS: The lungs are well-aerated. There appears to be acute focal airspace consolidation at the left lung base, concerning for pneumonia. Underlying scarring is again noted at the right lung base and left lung apex, with underlying emphysematous change. No pleural effusion or pneumothorax is seen.  The heart is borderline enlarged. No acute osseous abnormalities are seen.  IMPRESSION: 1. Acute focal airspace consolidation at the left lung base, concerning for pneumonia. 2.  Underlying chronic lung changes noted, with bilateral emphysema. Borderline cardiomegaly.   Electronically Signed   By: Garald Balding M.D.   On: 12/28/2013 00:20    Scheduled Meds: . atorvastatin  20 mg Oral q morning - 10a  . azithromycin  500 mg Intravenous Q24H  . budesonide (PULMICORT) nebulizer solution  0.25 mg Nebulization BID  . cefTRIAXone (ROCEPHIN)  IV  1 g Intravenous Q24H  . diltiazem  240 mg Oral Daily  . fluticasone  1 spray Each Nare Daily  . furosemide  20 mg Oral Daily  . guaiFENesin  600 mg Oral BID  . insulin aspart  0-9 Units Subcutaneous TID WC  . insulin detemir  10 Units Subcutaneous QHS  . ipratropium  0.5 mg Nebulization BID  . levothyroxine  112 mcg Oral QAC breakfast  . loratadine  10 mg Oral Daily  . magnesium sulfate 1 - 4 g bolus IVPB  2 g Intravenous Once  . methylPREDNISolone (SOLU-MEDROL) injection  40 mg Intravenous Q12H  . pantoprazole  40 mg Oral Daily  . potassium chloride  40 mEq Oral Daily  . 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 If 7PM-7AM, please contact night-coverage at [email or web address], password [facility] 12/29/2013, 5:46 PM  LOS: 2 days

## 2013-12-29 NOTE — Progress Notes (Signed)
Inpatient Diabetes Program Recommendations  AACE/ADA: New Consensus Statement on Inpatient Glycemic Control (2013)  Target Ranges:  Prepandial:   less than 140 mg/dL      Peak postprandial:   less than 180 mg/dL (1-2 hours)      Critically ill patients:  140 - 180 mg/dL   Reason for Assessment:  Results for Justin Garner, Justin Garner (MRN [id number]) as of 12/29/2013 13:00  Ref. Range 12/28/2013 10:49 12/28/2013 16:42 12/28/2013 20:48 12/29/2013 05:53 12/29/2013 11:04  Glucose-Capillary Latest Range: 70-99 mg/dL 156 (H) 91 208 (H) 275 (H) 209 (H)   Diabetes history:  Type 2 diabetes Outpatient Diabetes medications: Glipizide XL 10 mg daily, Januvia 50 mg daily Current orders for Inpatient glycemic control:  Novolog sensitive tid with meals (note that patient is also on IV Solumedrol)  May consider increasing Novolog correction to moderate.  Also may consider adding Levemir 10 units q AM while patient is on steroids.  Thanks, Adah Perl, RN, BC-ADM Inpatient Diabetes Coordinator Pager [phone]

## 2013-12-30 ENCOUNTER — Ambulatory Visit: Payer: Medicare Other | Admitting: Pulmonary Disease

## 2013-12-30 LAB — GLUCOSE, CAPILLARY
GLUCOSE-CAPILLARY: 246 mg/dL — AB (ref 70–99)
Glucose-Capillary: 212 mg/dL — ABNORMAL HIGH (ref 70–99)
Glucose-Capillary: 218 mg/dL — ABNORMAL HIGH (ref 70–99)
Glucose-Capillary: 281 mg/dL — ABNORMAL HIGH (ref 70–99)

## 2013-12-30 LAB — CBC
HCT: 41 % (ref 39.0–52.0)
HEMOGLOBIN: 14.5 g/dL (ref 13.0–17.0)
MCH: 31.3 pg (ref 26.0–34.0)
MCHC: 35.4 g/dL (ref 30.0–36.0)
MCV: 88.4 fL (ref 78.0–100.0)
Platelets: 400 10*3/uL (ref 150–400)
RBC: 4.64 MIL/uL (ref 4.22–5.81)
RDW: 15.3 % (ref 11.5–15.5)
WBC: 29.6 10*3/uL — ABNORMAL HIGH (ref 4.0–10.5)

## 2013-12-30 LAB — CULTURE, RESPIRATORY: CULTURE: NORMAL

## 2013-12-30 LAB — CULTURE, RESPIRATORY W GRAM STAIN

## 2013-12-30 LAB — BASIC METABOLIC PANEL
Anion gap: 14 (ref 5–15)
BUN: 32 mg/dL — AB (ref 6–23)
CALCIUM: 9 mg/dL (ref 8.4–10.5)
CO2: 24 meq/L (ref 19–32)
CREATININE: 1.21 mg/dL (ref 0.50–1.35)
Chloride: 101 mEq/L (ref 96–112)
GFR calc Af Amer: 64 mL/min — ABNORMAL LOW (ref >90)
GFR calc non Af Amer: 55 mL/min — ABNORMAL LOW (ref >90)
Glucose, Bld: 260 mg/dL — ABNORMAL HIGH (ref 70–99)
Potassium: 3.7 mEq/L (ref 3.7–5.3)
Sodium: 139 mEq/L (ref 137–147)

## 2013-12-30 LAB — PROTIME-INR
INR: 2.79 — ABNORMAL HIGH (ref 0.00–1.49)
Prothrombin Time: 29.4 seconds — ABNORMAL HIGH (ref 11.6–15.2)

## 2013-12-30 LAB — MAGNESIUM: Magnesium: 1.9 mg/dL (ref 1.5–2.5)

## 2013-12-30 MED ORDER — ZOLPIDEM TARTRATE 5 MG PO TABS
2.5000 mg | ORAL_TABLET | Freq: Every evening | ORAL | Status: DC | PRN
  Administered 2013-12-30 – 2014-01-01 (×2): 2.5 mg via ORAL
  Filled 2013-12-30 (×2): qty 1

## 2013-12-30 MED ORDER — WARFARIN SODIUM 2.5 MG PO TABS
2.5000 mg | ORAL_TABLET | Freq: Once | ORAL | Status: AC
  Administered 2013-12-30: 2.5 mg via ORAL
  Filled 2013-12-30: qty 1

## 2013-12-30 MED ORDER — PREDNISONE 20 MG PO TABS
40.0000 mg | ORAL_TABLET | Freq: Every day | ORAL | Status: DC
  Administered 2013-12-31 – 2014-01-02 (×3): 40 mg via ORAL
  Filled 2013-12-30 (×4): qty 2

## 2013-12-30 NOTE — Care Management Note (Addendum)
    Page 1 of 1   01/02/2014     11:41:39 AM CARE MANAGEMENT NOTE 01/02/2014  Patient:  LEVIATHAN, MACERA   Account Number:  [id number]  Date Initiated:  12/30/2013  Documentation initiated by:  Fuller Mandril  Subjective/Objective Assessment:   80 yo M with hx of A.fib, CHF, COPD here with sepsis and CAP.//Home with spouse.     Action/Plan:   Will monitor on telemetry, IVF, IV antibiotics.//Access for disposition needs.   Anticipated DC Date:  01/02/2014   Anticipated DC Plan:  [place]  CM consult      Choice offered to / List presented to:          [facility] arranged  HH-1 RN  [facility] PT      [facility] agency  [facility]   Status of service:  Completed, signed off Medicare Important Message given?  YES (If response is "NO", the following Medicare IM given date fields will be blank) Date Medicare IM given:  12/30/2013 Medicare IM given by:  [facility] Date Additional Medicare IM given:  01/02/2014 Additional Medicare IM given by:  Coolidge Gossard  Discharge Disposition:    Per UR Regulation:  Reviewed for med. necessity/level of care/duration of stay  If discussed at [place] of Stay Meetings, dates discussed:    Comments:  12/31/13 [place], RN, BSN, [employer] [phone] Spoke with pt at bedside regarding discharge planning for [facility]. Offered pt list of home health agencies to choose from.  Pt chose [facility] to render services. Christa See, RN of [facility] notified.  No DME needs identified at this time.

## 2013-12-30 NOTE — Progress Notes (Signed)
Justin Garner for Coumadin Indication: atrial fibrillation and h/o PE  No Known Allergies  Patient Measurements: Height: 6\' 1"  (185.4 cm) Weight: 193 lb (87.544 kg) (b scale) IBW/kg (Calculated) : 79.9  Vital Signs: Temp: 97.6 F (36.4 C) (10/13 0536) Temp Source: Oral (10/13 0536) BP: 111/80 mmHg (10/13 0536) Pulse Rate: 89 (10/13 0536)  Labs:  Recent Labs  12/27/13 2141 12/28/13 0406 12/28/13 0900 12/28/13 1620 12/29/13 0333 12/30/13 0421  HGB 15.9 14.3  --   --   --  14.5  HCT 44.2 41.2  --   --   --  41.0  PLT 322 323  --   --   --  400  APTT 45*  --   --   --   --   --   LABPROT 36.2*  --   --   --  32.1* 29.4*  INR 3.64*  --   --   --  3.12* 2.79*  CREATININE 1.47* 1.32  --   --   --  1.21  TROPONINI  --  <0.30 <0.30 <0.30  --   --     Estimated Creatinine Clearance: 55.9 ml/min (by C-G formula based on Cr of 1.21).   Medical History: Past Medical History  Diagnosis Date  . Diabetes mellitus   . Thyroid disease   . Coronary artery disease   . COPD (chronic obstructive pulmonary disease)   . GERD (gastroesophageal reflux disease)   . Pulmonary embolism   . Hyperlipidemia   . FH: colonic polyps   . PAF (paroxysmal atrial fibrillation)   . Hypertension   . ASCVD (arteriosclerotic cardiovascular disease)   . Cancer     Medications:  Prescriptions prior to admission  Medication Sig Dispense Refill  . atorvastatin (LIPITOR) 20 MG tablet Take 20 mg by mouth every morning.      . budesonide-formoterol (SYMBICORT) 160-4.5 MCG/ACT inhaler Inhale 2 puffs into the lungs 2 (two) times daily.       Marland Kitchen diltiazem (CARDIZEM CD) 240 MG 24 hr capsule Take 240 mg by mouth daily.      . fenofibrate micronized (LOFIBRA) 67 MG capsule Take 67 mg by mouth daily before breakfast.      . fish oil-omega-3 fatty acids 1000 MG capsule Take 1 g by mouth 2 (two) times daily.       . furosemide (LASIX) 20 MG tablet Take 20 mg by mouth daily.        Marland Kitchen glipiZIDE (GLUCOTROL XL) 10 MG 24 hr tablet Take 10 mg by mouth daily with breakfast.       . JANUVIA 50 MG tablet Take 50 mg by mouth every morning.       Marland Kitchen levothyroxine (SYNTHROID, LEVOTHROID) 112 MCG tablet Take 112 mcg by mouth daily.        . nitroGLYCERIN (NITROSTAT) 0.4 MG SL tablet Place 0.4 mg under the tongue every 5 (five) minutes as needed for chest pain.      Marland Kitchen omeprazole (PRILOSEC) 20 MG capsule Take 20 mg by mouth daily.        Marland Kitchen warfarin (COUMADIN) 5 MG tablet Take 5-7.5 mg by mouth daily. Take 5mg  on Sun, Tues, Thur, Sat Take 7.5mg  on Mon, Wed, & Fri.       Scheduled:  . aspirin  81 mg Oral Daily  . atorvastatin  20 mg Oral q morning - 10a  . azithromycin  500 mg Intravenous Q24H  . budesonide (PULMICORT) nebulizer  solution  0.25 mg Nebulization BID  . cefTRIAXone (ROCEPHIN)  IV  1 g Intravenous Q24H  . diltiazem  240 mg Oral Daily  . fluticasone  1 spray Each Nare Daily  . furosemide  20 mg Oral Daily  . guaiFENesin  600 mg Oral BID  . insulin aspart  0-9 Units Subcutaneous TID WC  . insulin detemir  10 Units Subcutaneous QHS  . ipratropium  0.5 mg Nebulization BID  . levothyroxine  112 mcg Oral QAC breakfast  . loratadine  10 mg Oral Daily  . methylPREDNISolone (SOLU-MEDROL) injection  40 mg Intravenous Q12H  . pantoprazole  40 mg Oral Daily  . potassium chloride  40 mEq Oral Daily  . Warfarin - Pharmacist Dosing Inpatient   Does not apply q1800   Infusions:     Assessment: 80yo male c/o weakness, in Afib in ED, admitted w/ sepsis, to continue Coumadin for Afib/PE; INR 2.79 today  Goal of Therapy:  INR 2-3   Plan:  Coumadin 2.5 mg po today Daily PT/INR  Thanks for allowing pharmacy to be a part of this patient's care.  Excell Seltzer, PharmD Clinical Pharmacist, [phone] 12/30/2013,8:25 AM

## 2013-12-30 NOTE — Progress Notes (Signed)
SATURATION QUALIFICATIONS: (This note is used to comply with regulatory documentation for home oxygen)     Patient Saturations on 4 Liters of oxygen AFTER Ambulating = 91%

## 2013-12-30 NOTE — Progress Notes (Addendum)
[facility] PROGRESS NOTE  TADARRIUS Garner KVQ:[id number] DOB: 1933-01-29 DOA: 12/27/2013 PCP: Kandice Hams, MD  Assessment/Plan: 1-acute resp failure with hypoxia: due to COPD exacerbation and CAP -continue IV antibiotics, nebulizer treatment, pulmicort, solumedrol and flutter valve -patient feeling better and responding to treatment -no fever and breathing easier -WBC's elevated due to use of steroids  2-Hx of A. Fib with RVR: will continue diltiazem -HR much better now. -will monitor  3-CAD: troponin neg X 3 -will continue ASA, statins -denies CP  4-HLD: continue statins  5-diabetes: continue SSI; given use of solumedrol will continue low dose levemir -A1C 6.4 -anticipate improvement of CBG's once steroids tapered   6-hypothyroidism: continue synthroid  7-allergic rhinitis: continue Claritin and flonase  8-GERD: continue PPI  9-protein calorie malnutrition: will follow nutrition service recommendations and continue feeding supplements  10-insomnia: will try PRN ambien  Code Status: Full Family Communication: no family at bedside Disposition Plan: to be determine    Consultants:  None   Procedures:  See below for x-ray reports   Antibiotics:  Rocephin 10/11  zithromax 10/11  HPI/Subjective: Afebrile, feeling better and breathing easier. Still with some coughing spells, wheezing and requiring O2 supplementation. Denies CP   Objective: Filed Vitals:   12/30/13 1513  BP: 126/79  Pulse: 110  Temp: 97.3 F (36.3 C)  Resp:     Intake/Output Summary (Last 24 hours) at 12/30/13 1611 Last data filed at 12/30/13 1519  Gross per 24 hour  Intake    840 ml  Output   1200 ml  Net   -360 ml   Filed Weights   12/28/13 0203 12/29/13 0542 12/30/13 0536  Weight: 85 kg (187 lb 6.3 oz) 86.773 kg (191 lb 4.8 oz) 87.544 kg (193 lb)    Exam:   General:  Afebrile, feeling better and breathing easier. Still with some coughing spells, wheezing and  requiring O2 supplementation. Denies CP   Cardiovascular: irregular, no rubs or gallops, no JVD  Respiratory: diffuse rhonchi, positive wheezing  Abdomen: soft, NT, ND, positive BS  Musculoskeletal: no edema, no cyanosis   Data Reviewed: Basic Metabolic Panel:  Recent Labs Lab 12/27/13 2141 12/28/13 0406 12/28/13 0900 12/30/13 0421  NA 140 139  --  139  K 3.0* 3.1*  --  3.7  CL 98 99  --  101  CO2 25 22  --  24  GLUCOSE 119* 94  --  260*  BUN 25* 24*  --  32*  CREATININE 1.47* 1.32  --  1.21  CALCIUM 8.7 8.2*  --  9.0  MG  --   --  1.2* 1.9   Liver Function Tests:  Recent Labs Lab 12/27/13 2141 12/28/13 0406  AST 44* 34  ALT 40 35  ALKPHOS 169* 149*  BILITOT 3.1* 2.6*  PROT 7.0 6.3  ALBUMIN 3.1* 2.7*   CBC:  Recent Labs Lab 12/27/13 2141 12/28/13 0406 12/30/13 0421  WBC 19.2* 17.8* 29.6*  NEUTROABS 16.6* 15.0*  --   HGB 15.9 14.3 14.5  HCT 44.2 41.2 41.0  MCV 87.5 90.0 88.4  PLT 322 323 400   Cardiac Enzymes:  Recent Labs Lab 12/28/13 0406 12/28/13 0900 12/28/13 1620  TROPONINI <0.30 <0.30 <0.30   BNP (last 3 results)  Recent Labs  12/27/13 2141  PROBNP 1235.0*   CBG:  Recent Labs Lab 12/29/13 1104 12/29/13 1608 12/29/13 2054 12/30/13 0531 12/30/13 1105  GLUCAP 209* 234* 228* 212* 218*    Recent Results (from the past 240  hour(s))  CULTURE, BLOOD (ROUTINE X 2)     Status: None   Collection Time    12/28/13  4:06 AM      Result Value Ref Range Status   Specimen Description BLOOD LEFT HAND   Final   Special Requests BOTTLES DRAWN AEROBIC AND ANAEROBIC 10CC EACH   Final   Culture  Setup Time     Final   Value: 12/28/2013 13:42     Performed at [facility]   Culture     Final   Value:        BLOOD CULTURE RECEIVED NO GROWTH TO DATE CULTURE WILL BE HELD FOR 5 DAYS BEFORE ISSUING A FINAL NEGATIVE REPORT     Note: Culture results may be compromised due to an excessive volume of blood received in culture bottles.      Performed at [facility]   Report Status PENDING   Incomplete  CULTURE, BLOOD (ROUTINE X 2)     Status: None   Collection Time    12/28/13  4:18 AM      Result Value Ref Range Status   Specimen Description BLOOD RIGHT ARM   Final   Special Requests BOTTLES DRAWN AEROBIC AND ANAEROBIC 10CC EACH   Final   Culture  Setup Time     Final   Value: 12/28/2013 13:41     Performed at [facility]   Culture     Final   Value:        BLOOD CULTURE RECEIVED NO GROWTH TO DATE CULTURE WILL BE HELD FOR 5 DAYS BEFORE ISSUING A FINAL NEGATIVE REPORT     Note: Culture results may be compromised due to an excessive volume of blood received in culture bottles.     Performed at [facility]   Report Status PENDING   Incomplete  CULTURE, EXPECTORATED SPUTUM-ASSESSMENT     Status: None   Collection Time    12/28/13  1:47 PM      Result Value Ref Range Status   Specimen Description SPUTUM   Final   Special Requests Normal   Final   Sputum evaluation     Final   Value: THIS SPECIMEN IS ACCEPTABLE. RESPIRATORY CULTURE REPORT TO FOLLOW.   Report Status 12/28/2013 FINAL   Final  CULTURE, RESPIRATORY (NON-EXPECTORATED)     Status: None   Collection Time    12/28/13  1:47 PM      Result Value Ref Range Status   Specimen Description SPUTUM   Final   Special Requests NONE   Final   Gram Stain     Final   Value: ABUNDANT WBC PRESENT,BOTH PMN AND MONONUCLEAR     RARE SQUAMOUS EPITHELIAL CELLS PRESENT     MODERATE GRAM POSITIVE COCCI     IN PAIRS FEW GRAM NEGATIVE COCCI     Performed at [facility]   Culture     Final   Value: NORMAL OROPHARYNGEAL FLORA     Performed at [facility]   Report Status 12/30/2013 FINAL   Final     Studies: No results found.  Scheduled Meds: . aspirin  81 mg Oral Daily  . atorvastatin  20 mg Oral q morning - 10a  . azithromycin  500 mg Intravenous Q24H  . budesonide (PULMICORT) nebulizer solution  0.25 mg Nebulization BID  .  cefTRIAXone (ROCEPHIN)  IV  1 g Intravenous Q24H  . diltiazem  240 mg Oral Daily  . fluticasone  1 spray Each Nare Daily  . furosemide  20 mg Oral Daily  . guaiFENesin  600 mg Oral BID  . insulin aspart  0-9 Units Subcutaneous TID WC  . insulin detemir  10 Units Subcutaneous QHS  . ipratropium  0.5 mg Nebulization BID  . levothyroxine  112 mcg Oral QAC breakfast  . loratadine  10 mg Oral Daily  . pantoprazole  40 mg Oral Daily  . potassium chloride  40 mEq Oral Daily  . [START ON 12/31/2013] predniSONE  40 mg Oral Q breakfast  . warfarin  2.5 mg Oral ONCE-1800  . Warfarin - Pharmacist Dosing Inpatient   Does not apply q1800   Continuous Infusions:   Active Problems:   Atrial fibrillation   Coronary atherosclerosis of native coronary artery   Essential hypertension, benign   CAP (community acquired pneumonia)   Hypokalemia   Sepsis   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   Protein-calorie malnutrition, severe    Time spent: 30 minutes    Barton Dubois  [facility] Pager [phone]. If 7PM-7AM, please contact night-coverage at [email or web address], password [facility] 12/30/2013, 4:11 PM  LOS: 3 days

## 2013-12-30 NOTE — Progress Notes (Signed)
Inpatient Diabetes Program Recommendations  AACE/ADA: New Consensus Statement on Inpatient Glycemic Control (2013)  Target Ranges:  Prepandial:   less than 140 mg/dL      Peak postprandial:   less than 180 mg/dL (1-2 hours)      Critically ill patients:  140 - 180 mg/dL  Results for RAYDER, SULLENGER (MRN [id number]) as of 12/30/2013 12:01  Ref. Range 12/29/2013 11:04 12/29/2013 16:08 12/29/2013 20:54 12/30/2013 05:31 12/30/2013 11:05  Glucose-Capillary Latest Range: 70-99 mg/dL 209 (H) 234 (H) 228 (H) 212 (H) 218 (H)   Consider increasing Novolog to moderate or resistant scale during steroid therapy. Thank you  Raoul Pitch BSN, RN,CDE Inpatient Diabetes Coordinator [phone] (team pager)

## 2013-12-30 NOTE — Evaluation (Signed)
Physical Therapy Evaluation Patient Details Name: Justin Garner MRN: [id number] DOB: Mar 17, 1933 Today's Date: 12/30/2013   History of Present Illness  Justin Garner is a 80 y.o. male  has a past medical history of Diabetes mellitus; Thyroid disease; Coronary artery disease; COPD (chronic obstructive pulmonary disease); GERD (gastroesophageal reflux disease); Pulmonary embolism; Hyperlipidemia; FH: colonic polyps; PAF (paroxysmal atrial fibrillation); Hypertension; ASCVD (arteriosclerotic cardiovascular disease); and Cancer. Presented with 1 week hx of chills, cough productive of yellow sputum once was a bit pink. He went to pulmonology 2 days ago and was told he likely has post nasal drip. He was started on Zertec with no improvement. Today patient developed fatigue, lethargy and worsening cough he was found to have WBC 19, fever 100.2 and A.fib w RVR up to 120 now improved after a dose of diltiazem IV. Patient have not been eating or drinking well. CXR was worrisome for PNA.      Clinical Impression  Pt admitted with A fib and above. Pt currently with functional limitations due to the deficits listed below (see PT Problem List).  Pt will benefit from skilled PT to increase their independence and safety with mobility to allow discharge to the venue listed below. Pt moving slowly but with no LOB.  His o2 >90% on 4 L/min with mobility with cueing for proper breathing techniques.  His HR was erratic quickly changing throughout session.      Follow Up Recommendations Home health PT    Equipment Recommendations  None recommended by PT    Recommendations for Other Services       Precautions / Restrictions Precautions Precautions: Fall Restrictions Weight Bearing Restrictions: No      Mobility  Bed Mobility Overal bed mobility: Needs Assistance Bed Mobility: Supine to Sit     Supine to sit: Supervision;HOB elevated        Transfers Overall transfer level: Needs assistance    Transfers: Sit to/from Stand Sit to Stand: From elevated surface;Min guard            Ambulation/Gait Ambulation/Gait assistance: Min guard Ambulation Distance (Feet): 90 Feet Assistive device:  (pushing IV pole) Gait Pattern/deviations: Trunk flexed;Decreased step length - right;Decreased step length - left Gait velocity: decreased   General Gait Details: Pt pushing IV pole to steady self.  Slow cadence and cueing for proper breathing pattern. O2 91% and HR 135 after gait.  Stairs            Wheelchair Mobility    Modified Rankin (Stroke Patients Only)       Balance Overall balance assessment: Needs assistance   Sitting balance-Leahy Scale: Good       Standing balance-Leahy Scale: Fair                               Pertinent Vitals/Pain Pain Assessment: No/denies pain    Home Living Family/patient expects to be discharged to:: Private residence Living Arrangements: Spouse/significant other Available Help at Discharge: Family;Available PRN/intermittently Type of Home: Mobile home Home Access: Stairs to enter Entrance Stairs-Rails: Can reach both Entrance Stairs-Number of Steps: 4 at the front and 7 at the back.  Typically goes in the back. Home Layout: One level Home Equipment: Cane - single point;Walker - 2 wheels      Prior Function Level of Independence: Independent with assistive device(s)         Comments: Amb with cane outside "just in case", but amb  indoors without any AD.     Hand Dominance   Dominant Hand: Right    Extremity/Trunk Assessment               Lower Extremity Assessment: Overall WFL for tasks assessed;Generalized weakness         Communication   Communication: HOH  Cognition Arousal/Alertness: Awake/alert Behavior During Therapy: WFL for tasks assessed/performed Overall Cognitive Status: Within Functional Limits for tasks assessed                      General Comments General  comments (skin integrity, edema, etc.): Education throughout treatment on pursed lip breathing.  OO2 90-94% throughout rx and HR 109-145 bpm- with quick fluctuations with HR.  Pt on central cardiac monitoring.    Exercises        Assessment/Plan    PT Assessment Patient needs continued PT services  PT Diagnosis Difficulty walking   PT Problem List Decreased strength;Decreased activity tolerance;Decreased balance;Decreased mobility  PT Treatment Interventions     PT Goals (Current goals can be found in the Care Plan section) Acute Rehab PT Goals Patient Stated Goal: go home PT Goal Formulation: With patient Time For Goal Achievement: 01/13/14 Potential to Achieve Goals: Good    Frequency Min 3X/week   Barriers to discharge        Co-evaluation               End of Session Equipment Utilized During Treatment: Gait belt Activity Tolerance: Patient tolerated treatment well Patient left: in chair Nurse Communication: Mobility status;Other (comment) (o2 sats)         Time: [phone] (no charge for toileting time)-0935 PT Time Calculation (min): 44 min   Charges:   PT Evaluation $Initial PT Evaluation Tier I: 1 Procedure PT Treatments $Gait Training: 8-22 mins $Therapeutic Activity: 8-22 mins   PT G Codes:          Shubham Thackston LUBECK 12/30/2013, 9:43 AM

## 2013-12-31 DIAGNOSIS — J9601 Acute respiratory failure with hypoxia: Secondary | ICD-10-CM

## 2013-12-31 LAB — BASIC METABOLIC PANEL
Anion gap: 12 (ref 5–15)
BUN: 36 mg/dL — ABNORMAL HIGH (ref 6–23)
CO2: 26 mEq/L (ref 19–32)
Calcium: 8.9 mg/dL (ref 8.4–10.5)
Chloride: 101 mEq/L (ref 96–112)
Creatinine, Ser: 1.29 mg/dL (ref 0.50–1.35)
GFR, EST AFRICAN AMERICAN: 59 mL/min — AB (ref >90)
GFR, EST NON AFRICAN AMERICAN: 51 mL/min — AB (ref >90)
Glucose, Bld: 255 mg/dL — ABNORMAL HIGH (ref 70–99)
Potassium: 4 mEq/L (ref 3.7–5.3)
SODIUM: 139 meq/L (ref 137–147)

## 2013-12-31 LAB — GLUCOSE, CAPILLARY
Glucose-Capillary: 191 mg/dL — ABNORMAL HIGH (ref 70–99)
Glucose-Capillary: 225 mg/dL — ABNORMAL HIGH (ref 70–99)
Glucose-Capillary: 229 mg/dL — ABNORMAL HIGH (ref 70–99)
Glucose-Capillary: 241 mg/dL — ABNORMAL HIGH (ref 70–99)

## 2013-12-31 LAB — PROTIME-INR
INR: 3.08 — AB (ref 0.00–1.49)
PROTHROMBIN TIME: 31.8 s — AB (ref 11.6–15.2)

## 2013-12-31 NOTE — Progress Notes (Signed)
[place] for Coumadin Indication: atrial fibrillation and h/o PE  No Known Allergies  Patient Measurements: Height: 6\' 1"  (185.4 cm) Weight: 194 lb 11.2 oz (88.315 kg) (Scale B) IBW/kg (Calculated) : 79.9  Vital Signs: Temp: 97.6 F (36.4 C) (10/14 0453) Temp Source: Oral (10/14 0453) BP: 119/88 mmHg (10/14 0959) Pulse Rate: 93 (10/14 0959)  Labs:  Recent Labs  12/28/13 1620 12/29/13 0333 12/30/13 0421 12/31/13 0400  HGB  --   --  14.5  --   HCT  --   --  41.0  --   PLT  --   --  400  --   LABPROT  --  32.1* 29.4* 31.8*  INR  --  3.12* 2.79* 3.08*  CREATININE  --   --  1.21 1.29  TROPONINI <0.30  --   --   --     Estimated Creatinine Clearance: 52.5 ml/min (by C-G formula based on Cr of 1.29).   Medical History: Past Medical History  Diagnosis Date  . Diabetes mellitus   . Thyroid disease   . Coronary artery disease   . COPD (chronic obstructive pulmonary disease)   . GERD (gastroesophageal reflux disease)   . Pulmonary embolism   . Hyperlipidemia   . FH: colonic polyps   . PAF (paroxysmal atrial fibrillation)   . Hypertension   . ASCVD (arteriosclerotic cardiovascular disease)   . Cancer     Medications:  Prescriptions prior to admission  Medication Sig Dispense Refill  . atorvastatin (LIPITOR) 20 MG tablet Take 20 mg by mouth every morning.      . budesonide-formoterol (SYMBICORT) 160-4.5 MCG/ACT inhaler Inhale 2 puffs into the lungs 2 (two) times daily.       Marland Kitchen diltiazem (CARDIZEM CD) 240 MG 24 hr capsule Take 240 mg by mouth daily.      . fenofibrate micronized (LOFIBRA) 67 MG capsule Take 67 mg by mouth daily before breakfast.      . fish oil-omega-3 fatty acids 1000 MG capsule Take 1 g by mouth 2 (two) times daily.       . furosemide (LASIX) 20 MG tablet Take 20 mg by mouth daily.       Marland Kitchen glipiZIDE (GLUCOTROL XL) 10 MG 24 hr tablet Take 10 mg by mouth daily with breakfast.       . JANUVIA 50 MG tablet Take  50 mg by mouth every morning.       Marland Kitchen levothyroxine (SYNTHROID, LEVOTHROID) 112 MCG tablet Take 112 mcg by mouth daily.        . nitroGLYCERIN (NITROSTAT) 0.4 MG SL tablet Place 0.4 mg under the tongue every 5 (five) minutes as needed for chest pain.      Marland Kitchen omeprazole (PRILOSEC) 20 MG capsule Take 20 mg by mouth daily.        Marland Kitchen warfarin (COUMADIN) 5 MG tablet Take 5-7.5 mg by mouth daily. Take 5mg  on Sun, Tues, Thur, Sat Take 7.5mg  on Mon, Wed, & Fri.       Scheduled:  . aspirin  81 mg Oral Daily  . atorvastatin  20 mg Oral q morning - 10a  . azithromycin  500 mg Intravenous Q24H  . budesonide (PULMICORT) nebulizer solution  0.25 mg Nebulization BID  . cefTRIAXone (ROCEPHIN)  IV  1 g Intravenous Q24H  . diltiazem  240 mg Oral Daily  . fluticasone  1 spray Each Nare Daily  . furosemide  20 mg Oral Daily  .  guaiFENesin  600 mg Oral BID  . insulin aspart  0-9 Units Subcutaneous TID WC  . insulin detemir  10 Units Subcutaneous QHS  . ipratropium  0.5 mg Nebulization BID  . levothyroxine  112 mcg Oral QAC breakfast  . loratadine  10 mg Oral Daily  . pantoprazole  40 mg Oral Daily  . potassium chloride  40 mEq Oral Daily  . predniSONE  40 mg Oral Q breakfast  . Warfarin - Pharmacist Dosing Inpatient   Does not apply q1800    Assessment: 80yo male c/o weakness, in Afib in ED, admitted w/ sepsis, to continue Coumadin for Afib/PE; INR 3.08 today which has increased from yesterday No bleeding noted  Goal of Therapy:  INR 2-3   Plan:  No coumadin today Daily PT/INR  Thanks for allowing pharmacy to be a part of this patient's care.  Excell Seltzer, PharmD Clinical Pharmacist, [phone] 12/31/2013,10:45 AM

## 2013-12-31 NOTE — Progress Notes (Signed)
Inpatient Diabetes Program Recommendations  AACE/ADA: New Consensus Statement on Inpatient Glycemic Control (2013)  Target Ranges:  Prepandial:   less than 140 mg/dL      Peak postprandial:   less than 180 mg/dL (1-2 hours)      Critically ill patients:  140 - 180 mg/dL   Prednisone primarily effects the post-prandial cbg's: Inpatient Diabetes Program Recommendations Insulin - Meal Coverage: Please add some meal coveage while on prednisone any dose greater than 10 mg/day. Start with 3-4 units tidwc  Thank you, Rosita Kea, RN, CNS, Diabetes Coordinator [phone])

## 2013-12-31 NOTE — Progress Notes (Signed)
[facility] PROGRESS NOTE  Assessment/Plan: Acute respiratory failure with hypoxia - ? Due to PNA and COPD exacerbation. - sat have remained > 95 %. Ambulate and check saturations. - nebulizer treatment, pulmicort, solumedrol and flutter valve  - patient feeling better and improving.  CAP (community acquired pneumonia) - On rocephin and azithro, afebrile.  A. Fib with RVR:  - will continue diltiazem  - HR improved now.   CAD:  - troponin neg X 3  - will continue ASA, statins  - denies CP   HLD: - continue statins   Diabetes:  - Continue SSI; given use of solumedrol will continue low dose levemir  - A1C 6.4  - Anticipate improvement of CBG's once steroids tapered   hypothyroidism:  - continue synthroid    Code Status: Full  Family Communication: no family at bedside  Disposition Plan: to be determine    Consultants:  None  Procedures:  See below for x-ray reports  Antibiotics:  Rocephin 10/11  zithromax 10/11  HPI/Subjective: No compalins, SOB slightly improve wants to go home.  Objective: Filed Vitals:   12/30/13 2045 12/31/13 0453 12/31/13 0934 12/31/13 0959  BP:  118/87  119/88  Pulse:  75  93  Temp:  97.6 F (36.4 C)    TempSrc:  Oral    Resp:  18    Height:      Weight:  88.315 kg (194 lb 11.2 oz)    SpO2: 95% 96% 95%     Intake/Output Summary (Last 24 hours) at 12/31/13 1321 Last data filed at 12/31/13 1314  Gross per 24 hour  Intake   1600 ml  Output   1100 ml  Net    500 ml   Filed Weights   12/29/13 0542 12/30/13 0536 12/31/13 0453  Weight: 86.773 kg (191 lb 4.8 oz) 87.544 kg (193 lb) 88.315 kg (194 lb 11.2 oz)    Exam:  General: Alert, awake, oriented x3, in no acute distress.  HEENT: No bruits, no goiter.  Heart: Regular rate and rhythm, without murmurs, rubs, gallops.  Lungs: Good air movement, ronchi  Abdomen: Soft, nontender, nondistended, positive bowel sounds.     Data Reviewed: Basic Metabolic  Panel:  Recent Labs Lab 12/27/13 2141 12/28/13 0406 12/28/13 0900 12/30/13 0421 12/31/13 0400  NA 140 139  --  139 139  K 3.0* 3.1*  --  3.7 4.0  CL 98 99  --  101 101  CO2 25 22  --  24 26  GLUCOSE 119* 94  --  260* 255*  BUN 25* 24*  --  32* 36*  CREATININE 1.47* 1.32  --  1.21 1.29  CALCIUM 8.7 8.2*  --  9.0 8.9  MG  --   --  1.2* 1.9  --    Liver Function Tests:  Recent Labs Lab 12/27/13 2141 12/28/13 0406  AST 44* 34  ALT 40 35  ALKPHOS 169* 149*  BILITOT 3.1* 2.6*  PROT 7.0 6.3  ALBUMIN 3.1* 2.7*   No results found for this basename: LIPASE, AMYLASE,  in the last 168 hours No results found for this basename: AMMONIA,  in the last 168 hours CBC:  Recent Labs Lab 12/27/13 2141 12/28/13 0406 12/30/13 0421  WBC 19.2* 17.8* 29.6*  NEUTROABS 16.6* 15.0*  --   HGB 15.9 14.3 14.5  HCT 44.2 41.2 41.0  MCV 87.5 90.0 88.4  PLT 322 323 400   Cardiac Enzymes:  Recent Labs Lab 12/28/13 0406 12/28/13 0900  12/28/13 1620  TROPONINI <0.30 <0.30 <0.30   BNP (last 3 results)  Recent Labs  12/27/13 2141  PROBNP 1235.0*   CBG:  Recent Labs Lab 12/30/13 1105 12/30/13 1605 12/30/13 2122 12/31/13 0556 12/31/13 1141  GLUCAP 218* 281* 246* 191* 225*    Recent Results (from the past 240 hour(s))  CULTURE, BLOOD (ROUTINE X 2)     Status: None   Collection Time    12/28/13  4:06 AM      Result Value Ref Range Status   Specimen Description BLOOD LEFT HAND   Final   Special Requests BOTTLES DRAWN AEROBIC AND ANAEROBIC 10CC EACH   Final   Culture  Setup Time     Final   Value: 12/28/2013 13:42     Performed at [facility]   Culture     Final   Value:        BLOOD CULTURE RECEIVED NO GROWTH TO DATE CULTURE WILL BE HELD FOR 5 DAYS BEFORE ISSUING A FINAL NEGATIVE REPORT     Note: Culture results may be compromised due to an excessive volume of blood received in culture bottles.     Performed at [facility]   Report Status PENDING    Incomplete  CULTURE, BLOOD (ROUTINE X 2)     Status: None   Collection Time    12/28/13  4:18 AM      Result Value Ref Range Status   Specimen Description BLOOD RIGHT ARM   Final   Special Requests BOTTLES DRAWN AEROBIC AND ANAEROBIC 10CC EACH   Final   Culture  Setup Time     Final   Value: 12/28/2013 13:41     Performed at [facility]   Culture     Final   Value:        BLOOD CULTURE RECEIVED NO GROWTH TO DATE CULTURE WILL BE HELD FOR 5 DAYS BEFORE ISSUING A FINAL NEGATIVE REPORT     Note: Culture results may be compromised due to an excessive volume of blood received in culture bottles.     Performed at [facility]   Report Status PENDING   Incomplete  CULTURE, EXPECTORATED SPUTUM-ASSESSMENT     Status: None   Collection Time    12/28/13  1:47 PM      Result Value Ref Range Status   Specimen Description SPUTUM   Final   Special Requests Normal   Final   Sputum evaluation     Final   Value: THIS SPECIMEN IS ACCEPTABLE. RESPIRATORY CULTURE REPORT TO FOLLOW.   Report Status 12/28/2013 FINAL   Final  CULTURE, RESPIRATORY (NON-EXPECTORATED)     Status: None   Collection Time    12/28/13  1:47 PM      Result Value Ref Range Status   Specimen Description SPUTUM   Final   Special Requests NONE   Final   Gram Stain     Final   Value: ABUNDANT WBC PRESENT,BOTH PMN AND MONONUCLEAR     RARE SQUAMOUS EPITHELIAL CELLS PRESENT     MODERATE GRAM POSITIVE COCCI     IN PAIRS FEW GRAM NEGATIVE COCCI     Performed at [facility]   Culture     Final   Value: NORMAL OROPHARYNGEAL FLORA     Performed at [facility]   Report Status 12/30/2013 FINAL   Final     Studies: No results found.  Scheduled Meds: . aspirin  81 mg Oral  Daily  . atorvastatin  20 mg Oral q morning - 10a  . azithromycin  500 mg Intravenous Q24H  . budesonide (PULMICORT) nebulizer solution  0.25 mg Nebulization BID  . cefTRIAXone (ROCEPHIN)  IV  1 g Intravenous Q24H  . diltiazem   240 mg Oral Daily  . fluticasone  1 spray Each Nare Daily  . furosemide  20 mg Oral Daily  . guaiFENesin  600 mg Oral BID  . insulin aspart  0-9 Units Subcutaneous TID WC  . insulin detemir  10 Units Subcutaneous QHS  . ipratropium  0.5 mg Nebulization BID  . levothyroxine  112 mcg Oral QAC breakfast  . loratadine  10 mg Oral Daily  . pantoprazole  40 mg Oral Daily  . potassium chloride  40 mEq Oral Daily  . predniSONE  40 mg Oral Q breakfast  . Warfarin - Pharmacist Dosing Inpatient   Does not apply q1800   Continuous Infusions:    Charlynne Cousins  [facility] Pager [phone]. If 8PM-8AM, please contact night-coverage at [email or web address], password [facility] 12/31/2013, 1:21 PM  LOS: 4 days

## 2013-12-31 NOTE — Progress Notes (Signed)
Pt refuses to use acapella breathing device, as suggested per MD.

## 2013-12-31 NOTE — Progress Notes (Signed)
Physical Therapy Treatment Patient Details Name: Justin Garner MRN: [id number] DOB: 1933-02-25 Today's Date: 12/31/2013    History of Present Illness Justin Garner is a 80 y.o. male  has a past medical history of Diabetes mellitus; Thyroid disease; Coronary artery disease; COPD (chronic obstructive pulmonary disease); GERD (gastroesophageal reflux disease); Pulmonary embolism; Hyperlipidemia; FH: colonic polyps; PAF (paroxysmal atrial fibrillation); Hypertension; ASCVD (arteriosclerotic cardiovascular disease); and Cancer. Presented with 1 week hx of chills, cough productive of yellow sputum once was a bit pink. He went to pulmonology 2 days ago and was told he likely has post nasal drip. He was started on Zertec with no improvement. Today patient developed fatigue, lethargy and worsening cough he was found to have WBC 19, fever 100.2 and A.fib w RVR up to 120 now improved after a dose of diltiazem IV. Patient have not been eating or drinking well. CXR was worrisome for PNA.        PT Comments    Pt progressing towards physical therapy goals. Was able to perform gait training on 3L/min supplemental O2. Pt was educated on pursed-lip breathing, and when to take standing rest breaks to maximize O2 sats during mobility. RW was used for energy conservation. Will continue to follow and progress as able per POC.   Follow Up Recommendations  Home health PT     Equipment Recommendations  None recommended by PT    Recommendations for Other Services       Precautions / Restrictions Precautions Precautions: Fall Restrictions Weight Bearing Restrictions: No    Mobility  Bed Mobility Overal bed mobility: Needs Assistance Bed Mobility: Supine to Sit     Supine to sit: Supervision;HOB elevated     General bed mobility comments: No physical assist required. Pt able to transition EOB with increased time and use of bed rails for support. Supervision for safety.   Transfers Overall  transfer level: Needs assistance Equipment used: Rolling walker (2 wheeled) Transfers: Sit to/from Stand Sit to Stand: Min guard         General transfer comment: Pt able to power-up to full standing position without assist. RW for support once standing.   Ambulation/Gait Ambulation/Gait assistance: Min guard Ambulation Distance (Feet): 100 Feet Assistive device: Rolling walker (2 wheeled) Gait Pattern/deviations: Step-through pattern;Decreased stride length;Trunk flexed;Narrow base of support Gait velocity: Decreased Gait velocity interpretation: Below normal speed for age/gender General Gait Details: RW for energy conservation. Pt was cued for pursed-lip breathing throughout session.    Stairs            Wheelchair Mobility    Modified Rankin (Stroke Patients Only)       Balance Overall balance assessment: Needs assistance Sitting-balance support: Feet supported;No upper extremity supported Sitting balance-Leahy Scale: Good     Standing balance support: No upper extremity supported;During functional activity Standing balance-Leahy Scale: Fair                      Cognition Arousal/Alertness: Awake/alert Behavior During Therapy: Flat affect Overall Cognitive Status: Within Functional Limits for tasks assessed                      Exercises      General Comments        Pertinent Vitals/Pain Pain Assessment: No/denies pain    Home Living                      Prior Function  PT Goals (current goals can now be found in the care plan section) Acute Rehab PT Goals Patient Stated Goal: go home PT Goal Formulation: With patient Time For Goal Achievement: 01/13/14 Potential to Achieve Goals: Good Progress towards PT goals: Progressing toward goals    Frequency  Min 3X/week    PT Plan Current plan remains appropriate    Co-evaluation             End of Session Equipment Utilized During Treatment: Gait  belt Activity Tolerance: Patient tolerated treatment well Patient left: in chair;with call bell/phone within reach     Time: 1531-1555 PT Time Calculation (min): 24 min  Charges:  $Gait Training: 8-22 mins $Therapeutic Activity: 8-22 mins                    G Codes:      Rolinda Roan January 17, 2014, 4:07 PM  Rolinda Roan, PT, DPT Acute Rehabilitation Services Pager: [phone]

## 2014-01-01 LAB — GLUCOSE, CAPILLARY
GLUCOSE-CAPILLARY: 103 mg/dL — AB (ref 70–99)
GLUCOSE-CAPILLARY: 229 mg/dL — AB (ref 70–99)
Glucose-Capillary: 198 mg/dL — ABNORMAL HIGH (ref 70–99)
Glucose-Capillary: 268 mg/dL — ABNORMAL HIGH (ref 70–99)

## 2014-01-01 LAB — BASIC METABOLIC PANEL
ANION GAP: 17 — AB (ref 5–15)
BUN: 32 mg/dL — ABNORMAL HIGH (ref 6–23)
CHLORIDE: 91 meq/L — AB (ref 96–112)
CO2: 29 meq/L (ref 19–32)
Calcium: 9.2 mg/dL (ref 8.4–10.5)
Creatinine, Ser: 1.2 mg/dL (ref 0.50–1.35)
GFR calc non Af Amer: 56 mL/min — ABNORMAL LOW (ref >90)
GFR, EST AFRICAN AMERICAN: 65 mL/min — AB (ref >90)
Glucose, Bld: 100 mg/dL — ABNORMAL HIGH (ref 70–99)
Potassium: 4.3 mEq/L (ref 3.7–5.3)
SODIUM: 137 meq/L (ref 137–147)

## 2014-01-01 LAB — PROTIME-INR
INR: 2.55 — AB (ref 0.00–1.49)
Prothrombin Time: 27.7 seconds — ABNORMAL HIGH (ref 11.6–15.2)

## 2014-01-01 MED ORDER — HALOPERIDOL LACTATE 5 MG/ML IJ SOLN: 2.0000 mg | Freq: Four times a day (QID) | INTRAMUSCULAR | Status: DC | PRN

## 2014-01-01 MED ORDER — ZOLPIDEM TARTRATE 5 MG PO TABS: 5.0000 mg | ORAL_TABLET | Freq: Every evening | ORAL | Status: DC | PRN

## 2014-01-01 MED ORDER — LEVOFLOXACIN 500 MG PO TABS
500.0000 mg | ORAL_TABLET | Freq: Every day | ORAL | Status: DC
  Administered 2014-01-01 – 2014-01-02 (×2): 500 mg via ORAL
  Filled 2014-01-01 (×2): qty 1

## 2014-01-01 MED ORDER — WARFARIN SODIUM 2.5 MG PO TABS
2.5000 mg | ORAL_TABLET | Freq: Once | ORAL | Status: AC
  Administered 2014-01-01: 2.5 mg via ORAL
  Filled 2014-01-01: qty 1

## 2014-01-01 MED ORDER — DILTIAZEM HCL ER COATED BEADS 300 MG PO CP24
300.0000 mg | ORAL_CAPSULE | Freq: Every day | ORAL | Status: DC
  Administered 2014-01-02: 300 mg via ORAL
  Filled 2014-01-01: qty 1

## 2014-01-01 MED ORDER — DILTIAZEM HCL 30 MG PO TABS
30.0000 mg | ORAL_TABLET | Freq: Four times a day (QID) | ORAL | Status: AC
  Administered 2014-01-01 (×2): 30 mg via ORAL
  Filled 2014-01-01 (×2): qty 1

## 2014-01-01 NOTE — Progress Notes (Signed)
[facility] PROGRESS NOTE  Assessment/Plan: Acute respiratory failure with hypoxia - ? Due to PNA and COPD exacerbation. - sat have remained > 95 %. Ambulate and check saturations. - nebulizer treatment, pulmicort, solumedrol and flutter valve  - patient feeling better and improving.  CAP (community acquired pneumonia) - De-escalate antibiotic to levaquin.  A. Fib with RVR:  - Increase diltiazem, HR still in the 90's.  CAD:  - troponin neg X 3  - will continue ASA, statins  - denies CP   HLD: - continue statins   Diabetes:  - Continue SSI; given use of solumedrol will continue low dose levemir  - A1C 6.4  - Anticipate improvement of CBG's once steroids tapered   hypothyroidism:  - continue synthroid    Code Status: Full  Family Communication: no family at bedside  Disposition Plan: to be determine    Consultants:  None  Procedures:  See below for x-ray reports  Antibiotics:  Rocephin 10/11  zithromax 10/11  HPI/Subjective: No compalins, SOB improve want something to sleep.  Objective: Filed Vitals:   12/31/13 2137 01/01/14 0701 01/01/14 0926 01/01/14 1010  BP:  138/91  133/92  Pulse:  72  119  Temp:  97.7 F (36.5 C)    TempSrc:  Oral    Resp:  20  20  Height:      Weight:  88.451 kg (195 lb)    SpO2: 97% 95% 94% 93%    Intake/Output Summary (Last 24 hours) at 01/01/14 1226 Last data filed at 01/01/14 1010  Gross per 24 hour  Intake   1630 ml  Output   1252 ml  Net    378 ml   Filed Weights   12/30/13 0536 12/31/13 0453 01/01/14 0701  Weight: 87.544 kg (193 lb) 88.315 kg (194 lb 11.2 oz) 88.451 kg (195 lb)    Exam:  General: Alert, awake, oriented x3, in no acute distress.  HEENT: No bruits, no goiter.  Heart: Regular rate and rhythm, without murmurs, rubs, gallops.  Lungs: Good air movement, clear Abdomen: Soft, nontender, nondistended, positive bowel sounds.     Data Reviewed: Basic Metabolic Panel:  Recent  Labs Lab 12/27/13 2141 12/28/13 0406 12/28/13 0900 12/30/13 0421 12/31/13 0400 01/01/14 0644  NA 140 139  --  139 139 137  K 3.0* 3.1*  --  3.7 4.0 4.3  CL 98 99  --  101 101 91*  CO2 25 22  --  24 26 29   GLUCOSE 119* 94  --  260* 255* 100*  BUN 25* 24*  --  32* 36* 32*  CREATININE 1.47* 1.32  --  1.21 1.29 1.20  CALCIUM 8.7 8.2*  --  9.0 8.9 9.2  MG  --   --  1.2* 1.9  --   --    Liver Function Tests:  Recent Labs Lab 12/27/13 2141 12/28/13 0406  AST 44* 34  ALT 40 35  ALKPHOS 169* 149*  BILITOT 3.1* 2.6*  PROT 7.0 6.3  ALBUMIN 3.1* 2.7*   No results found for this basename: LIPASE, AMYLASE,  in the last 168 hours No results found for this basename: AMMONIA,  in the last 168 hours CBC:  Recent Labs Lab 12/27/13 2141 12/28/13 0406 12/30/13 0421  WBC 19.2* 17.8* 29.6*  NEUTROABS 16.6* 15.0*  --   HGB 15.9 14.3 14.5  HCT 44.2 41.2 41.0  MCV 87.5 90.0 88.4  PLT 322 323 400   Cardiac Enzymes:  Recent Labs Lab  12/28/13 0406 12/28/13 0900 12/28/13 1620  TROPONINI <0.30 <0.30 <0.30   BNP (last 3 results)  Recent Labs  12/27/13 2141  PROBNP 1235.0*   CBG:  Recent Labs Lab 12/31/13 1141 12/31/13 1628 12/31/13 2037 01/01/14 0655 01/01/14 1109  GLUCAP 225* 229* 241* 103* 198*    Recent Results (from the past 240 hour(s))  CULTURE, BLOOD (ROUTINE X 2)     Status: None   Collection Time    12/28/13  4:06 AM      Result Value Ref Range Status   Specimen Description BLOOD LEFT HAND   Final   Special Requests BOTTLES DRAWN AEROBIC AND ANAEROBIC 10CC EACH   Final   Culture  Setup Time     Final   Value: 12/28/2013 13:42     Performed at [facility]   Culture     Final   Value:        BLOOD CULTURE RECEIVED NO GROWTH TO DATE CULTURE WILL BE HELD FOR 5 DAYS BEFORE ISSUING A FINAL NEGATIVE REPORT     Note: Culture results may be compromised due to an excessive volume of blood received in culture bottles.     Performed at [facility]   Report Status PENDING   Incomplete  CULTURE, BLOOD (ROUTINE X 2)     Status: None   Collection Time    12/28/13  4:18 AM      Result Value Ref Range Status   Specimen Description BLOOD RIGHT ARM   Final   Special Requests BOTTLES DRAWN AEROBIC AND ANAEROBIC 10CC EACH   Final   Culture  Setup Time     Final   Value: 12/28/2013 13:41     Performed at [facility]   Culture     Final   Value:        BLOOD CULTURE RECEIVED NO GROWTH TO DATE CULTURE WILL BE HELD FOR 5 DAYS BEFORE ISSUING A FINAL NEGATIVE REPORT     Note: Culture results may be compromised due to an excessive volume of blood received in culture bottles.     Performed at [facility]   Report Status PENDING   Incomplete  CULTURE, EXPECTORATED SPUTUM-ASSESSMENT     Status: None   Collection Time    12/28/13  1:47 PM      Result Value Ref Range Status   Specimen Description SPUTUM   Final   Special Requests Normal   Final   Sputum evaluation     Final   Value: THIS SPECIMEN IS ACCEPTABLE. RESPIRATORY CULTURE REPORT TO FOLLOW.   Report Status 12/28/2013 FINAL   Final  CULTURE, RESPIRATORY (NON-EXPECTORATED)     Status: None   Collection Time    12/28/13  1:47 PM      Result Value Ref Range Status   Specimen Description SPUTUM   Final   Special Requests NONE   Final   Gram Stain     Final   Value: ABUNDANT WBC PRESENT,BOTH PMN AND MONONUCLEAR     RARE SQUAMOUS EPITHELIAL CELLS PRESENT     MODERATE GRAM POSITIVE COCCI     IN PAIRS FEW GRAM NEGATIVE COCCI     Performed at [facility]   Culture     Final   Value: NORMAL OROPHARYNGEAL FLORA     Performed at [facility]   Report Status 12/30/2013 FINAL   Final     Studies: No results found.  Scheduled Meds: . aspirin  81 mg Oral Daily  . atorvastatin  20 mg Oral q morning - 10a  . azithromycin  500 mg Intravenous Q24H  . budesonide (PULMICORT) nebulizer solution  0.25 mg Nebulization BID  . cefTRIAXone (ROCEPHIN)  IV  1  g Intravenous Q24H  . diltiazem  240 mg Oral Daily  . fluticasone  1 spray Each Nare Daily  . furosemide  20 mg Oral Daily  . guaiFENesin  600 mg Oral BID  . insulin aspart  0-9 Units Subcutaneous TID WC  . insulin detemir  10 Units Subcutaneous QHS  . ipratropium  0.5 mg Nebulization BID  . levothyroxine  112 mcg Oral QAC breakfast  . loratadine  10 mg Oral Daily  . pantoprazole  40 mg Oral Daily  . potassium chloride  40 mEq Oral Daily  . predniSONE  40 mg Oral Q breakfast  . warfarin  2.5 mg Oral ONCE-1800  . Warfarin - Pharmacist Dosing Inpatient   Does not apply q1800   Continuous Infusions:    Charlynne Cousins  [facility] Pager [phone]. If 8PM-8AM, please contact night-coverage at [email or web address], password [facility] 01/01/2014, 12:26 PM  LOS: 5 days

## 2014-01-01 NOTE — Progress Notes (Signed)
Report given to receiving RN. Patient in bed resting. No verbal complaints and no signs or symptoms of distress or discomfort.

## 2014-01-01 NOTE — Progress Notes (Signed)
[facility] for Coumadin Indication: atrial fibrillation and h/o PE  No Known Allergies  Patient Measurements: Height: 6\' 1"  (185.4 cm) Weight: 195 lb (88.451 kg) (b scale) IBW/kg (Calculated) : 79.9  Vital Signs: Temp: 97.7 F (36.5 C) (10/15 0701) Temp Source: Oral (10/15 0701) BP: 133/92 mmHg (10/15 1010) Pulse Rate: 119 (10/15 1010)  Labs:  Recent Labs  12/30/13 0421 12/31/13 0400 01/01/14 0644  HGB 14.5  --   --   HCT 41.0  --   --   PLT 400  --   --   LABPROT 29.4* 31.8* 27.7*  INR 2.79* 3.08* 2.55*  CREATININE 1.21 1.29 1.20    Estimated Creatinine Clearance: 56.4 ml/min (by C-G formula based on Cr of 1.2).   Medical History: Past Medical History  Diagnosis Date  . Diabetes mellitus   . Thyroid disease   . Coronary artery disease   . COPD (chronic obstructive pulmonary disease)   . GERD (gastroesophageal reflux disease)   . Pulmonary embolism   . Hyperlipidemia   . FH: colonic polyps   . PAF (paroxysmal atrial fibrillation)   . Hypertension   . ASCVD (arteriosclerotic cardiovascular disease)   . Cancer     Medications:  Prescriptions prior to admission  Medication Sig Dispense Refill  . atorvastatin (LIPITOR) 20 MG tablet Take 20 mg by mouth every morning.      . budesonide-formoterol (SYMBICORT) 160-4.5 MCG/ACT inhaler Inhale 2 puffs into the lungs 2 (two) times daily.       Marland Kitchen diltiazem (CARDIZEM CD) 240 MG 24 hr capsule Take 240 mg by mouth daily.      . fenofibrate micronized (LOFIBRA) 67 MG capsule Take 67 mg by mouth daily before breakfast.      . fish oil-omega-3 fatty acids 1000 MG capsule Take 1 g by mouth 2 (two) times daily.       . furosemide (LASIX) 20 MG tablet Take 20 mg by mouth daily.       Marland Kitchen glipiZIDE (GLUCOTROL XL) 10 MG 24 hr tablet Take 10 mg by mouth daily with breakfast.       . JANUVIA 50 MG tablet Take 50 mg by mouth every morning.       Marland Kitchen levothyroxine (SYNTHROID, LEVOTHROID) 112 MCG  tablet Take 112 mcg by mouth daily.        . nitroGLYCERIN (NITROSTAT) 0.4 MG SL tablet Place 0.4 mg under the tongue every 5 (five) minutes as needed for chest pain.      Marland Kitchen omeprazole (PRILOSEC) 20 MG capsule Take 20 mg by mouth daily.        Marland Kitchen warfarin (COUMADIN) 5 MG tablet Take 5-7.5 mg by mouth daily. Take 5mg  on Sun, Tues, Thur, Sat Take 7.5mg  on Mon, Wed, & Fri.       Scheduled:  . aspirin  81 mg Oral Daily  . atorvastatin  20 mg Oral q morning - 10a  . azithromycin  500 mg Intravenous Q24H  . budesonide (PULMICORT) nebulizer solution  0.25 mg Nebulization BID  . cefTRIAXone (ROCEPHIN)  IV  1 g Intravenous Q24H  . diltiazem  240 mg Oral Daily  . fluticasone  1 spray Each Nare Daily  . furosemide  20 mg Oral Daily  . guaiFENesin  600 mg Oral BID  . insulin aspart  0-9 Units Subcutaneous TID WC  . insulin detemir  10 Units Subcutaneous QHS  . ipratropium  0.5 mg Nebulization BID  . levothyroxine  112 mcg Oral QAC breakfast  . loratadine  10 mg Oral Daily  . pantoprazole  40 mg Oral Daily  . potassium chloride  40 mEq Oral Daily  . predniSONE  40 mg Oral Q breakfast  . warfarin  2.5 mg Oral ONCE-1800  . Warfarin - Pharmacist Dosing Inpatient   Does not apply q1800    Assessment: 80yo male c/o weakness, in Afib in ED, admitted w/ sepsis, to continue Coumadin for Afib/PE; INR 2.55 No bleeding noted  Goal of Therapy:  INR 2-3   Plan:  Coumadin 2.5 mg today Daily PT/INR  Thanks for allowing pharmacy to be a part of this patient's care.  Excell Seltzer, PharmD Clinical Pharmacist, [phone] 01/01/2014,10:31 AM

## 2014-01-02 LAB — GLUCOSE, CAPILLARY
GLUCOSE-CAPILLARY: 104 mg/dL — AB (ref 70–99)
GLUCOSE-CAPILLARY: 155 mg/dL — AB (ref 70–99)

## 2014-01-02 LAB — PROTIME-INR
INR: 2.08 — ABNORMAL HIGH (ref 0.00–1.49)
Prothrombin Time: 23.6 seconds — ABNORMAL HIGH (ref 11.6–15.2)

## 2014-01-02 MED ORDER — PREDNISONE 10 MG PO TABS: ORAL_TABLET | ORAL | Status: DC

## 2014-01-02 MED ORDER — LEVOFLOXACIN 500 MG PO TABS: 500.0000 mg | ORAL_TABLET | Freq: Every day | ORAL | Status: DC

## 2014-01-02 MED ORDER — WARFARIN SODIUM 5 MG PO TABS
5.0000 mg | ORAL_TABLET | Freq: Once | ORAL | Status: DC
  Filled 2014-01-02: qty 1

## 2014-01-02 NOTE — Progress Notes (Deleted)
Physician Discharge Summary  Justin Garner:[id number] DOB: 06/10/1932 DOA: 12/27/2013  PCP: Kandice Hams, MD  Admit date: 12/27/2013 Discharge date: 01/02/2014  Time spent: 35 minutes  Recommendations for Outpatient Follow-up:  1. Follow up with Cardiology next week. 2. PCP in 2 weeks.  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 male Presented with  1 week hx of chills, cough productive of yellow sputum once was a bit pink. He went to pulmonology 2 days ago and was told he likely has post nasal drip. He was started on Zertec with no improvement. Today patient developed fatigue, lethargy and worsening cough he was found to have WBC 19, fever 100.2 and A.fib w RVR up to 120 now improved after a dose of diltiazem IV. Patient have not been eating or drinking well. CXR was worrisome for PNA.   Hospital Course:  Acute respiratory failure with hypoxia Due to PNA and COPD exacerbation: - Initially was desating started on oxygen IV steroids, antibiotics and inhalers. - after improvement, with  sat remaining > 95 %.  - steroids and antibiotics changed to oral. - will go home on steroid tapered and Levaquin for 4 days.  CAP (community acquired pneumonia)  - Cont levaquin as an outpatient.  - see above.  A. Fib with RVR:  - Mildly tachycardic no change in diltiazem.  CAD:  - troponin neg X 3  - will continue  ASA, statins  - denies CP.  HLD:  - continue statins   Diabetes:  - Continue SSI; given use of solumedrol will continue low dose levemir  - A1C 6.4     Procedures:  CXR  Consultations:  none  Discharge Exam: Filed Vitals:   01/02/14 1049  BP: 113/83  Pulse: 70  Temp:   Resp:     General: A&O x3 Cardiovascular: RRR Respiratory: good air movement CTA B/L  Discharge Instructions You were cared for by a hospitalist during your hospital stay. If you have any questions about your discharge medications or the care you received while you were in the hospital after you are discharged, you can call the unit and asked to speak with the hospitalist on call if the hospitalist that took care of you is not available. Once you are discharged, your primary care physician will handle any further medical issues. Please note that NO REFILLS for any discharge medications will be authorized once you are discharged, as it is imperative that you return to your primary care physician (or establish a relationship with a primary care physician if you do not have one) for your aftercare needs so that they can reassess your need for medications and monitor your lab values.  Discharge Instructions   Diet - low sodium heart healthy    Complete by:  As directed      Increase activity slowly    Complete by:  As directed  Current Discharge Medication List    START taking these medications   Details  levofloxacin (LEVAQUIN) 500 MG tablet Take 1 tablet (500 mg total) by mouth daily. Qty: 3 tablet, Refills: 0    predniSONE (DELTASONE) 10 MG tablet Takes 4 tablets for 1 days, then 3 tablets for 1 days, then 2 tabs for 1 days, then 1 tab for 1 days, and then stop. Qty: 10 tablet, Refills: 0      CONTINUE these medications which have NOT CHANGED   Details  atorvastatin (LIPITOR) 20 MG tablet Take 20 mg by mouth every morning.    budesonide-formoterol (SYMBICORT) 160-4.5 MCG/ACT inhaler  Inhale 2 puffs into the lungs 2 (two) times daily.     diltiazem (CARDIZEM CD) 240 MG 24 hr capsule Take 240 mg by mouth daily.    fenofibrate micronized (LOFIBRA) 67 MG capsule Take 67 mg by mouth daily before breakfast.    fish oil-omega-3 fatty acids 1000 MG capsule Take 1 g by mouth 2 (two) times daily.     furosemide (LASIX) 20 MG tablet Take 20 mg by mouth daily.     glipiZIDE (GLUCOTROL XL) 10 MG 24 hr tablet Take 10 mg by mouth daily with breakfast.     JANUVIA 50 MG tablet Take 50 mg by mouth every morning.     levothyroxine (SYNTHROID, LEVOTHROID) 112 MCG tablet Take 112 mcg by mouth daily.      nitroGLYCERIN (NITROSTAT) 0.4 MG SL tablet Place 0.4 mg under the tongue every 5 (five) minutes as needed for chest pain.    omeprazole (PRILOSEC) 20 MG capsule Take 20 mg by mouth daily.      warfarin (COUMADIN) 5 MG tablet Take 5-7.5 mg by mouth daily. Take 5mg  on Sun, Tues, Thur, Sat Take 7.5mg  on Mon, Wed, & Fri.       No Known Allergies Follow-up Information   Follow up with [facility]. (RN/PT)    Contact information:   [place] [address] [phone]        The results of significant diagnostics from this hospitalization (including imaging, microbiology, ancillary and laboratory) are listed below for reference.    Significant Diagnostic Studies: Dg Chest 2 View  12/28/2013   CLINICAL DATA:  Acute onset of weakness and shortness of breath, worsening over the past week. Initial encounter.  EXAM: CHEST  2 VIEW  COMPARISON:  Chest radiograph from 10/28/2013  FINDINGS: The lungs are well-aerated. There appears to be acute focal airspace consolidation at the left lung base, concerning for pneumonia. Underlying scarring is again noted at the right lung base and left lung apex, with underlying emphysematous change. No pleural effusion or pneumothorax is seen.  The heart is borderline enlarged. No acute osseous abnormalities are seen.   IMPRESSION: 1. Acute focal airspace consolidation at the left lung base, concerning for pneumonia. 2. Underlying chronic lung changes noted, with bilateral emphysema. Borderline cardiomegaly.   Electronically Signed   By: Garald Balding M.D.   On: 12/28/2013 00:20    Microbiology: Recent Results (from the past 240 hour(s))  CULTURE, BLOOD (ROUTINE X 2)     Status: None   Collection Time    12/28/13  4:06 AM      Result Value Ref Range Status   Specimen Description BLOOD LEFT HAND   Final   Special Requests BOTTLES DRAWN AEROBIC AND ANAEROBIC 10CC EACH   Final   Culture  Setup Time     Final  Value: 12/28/2013 13:42     Performed at [facility]   Culture     Final   Value:        BLOOD CULTURE RECEIVED NO GROWTH TO DATE CULTURE WILL BE HELD FOR 5 DAYS BEFORE ISSUING A FINAL NEGATIVE REPORT     Note: Culture results may be compromised due to an excessive volume of blood received in culture bottles.     Performed at [facility]   Report Status PENDING   Incomplete  CULTURE, BLOOD (ROUTINE X 2)     Status: None   Collection Time    12/28/13  4:18 AM      Result Value Ref Range Status   Specimen Description BLOOD RIGHT ARM   Final   Special Requests BOTTLES DRAWN AEROBIC AND ANAEROBIC 10CC EACH   Final   Culture  Setup Time     Final   Value: 12/28/2013 13:41     Performed at [facility]   Culture     Final   Value:        BLOOD CULTURE RECEIVED NO GROWTH TO DATE CULTURE WILL BE HELD FOR 5 DAYS BEFORE ISSUING A FINAL NEGATIVE REPORT     Note: Culture results may be compromised due to an excessive volume of blood received in culture bottles.     Performed at [facility]   Report Status PENDING   Incomplete  CULTURE, EXPECTORATED SPUTUM-ASSESSMENT     Status: None   Collection Time    12/28/13  1:47 PM      Result Value Ref Range Status   Specimen Description SPUTUM   Final   Special Requests Normal   Final   Sputum evaluation     Final   Value:  THIS SPECIMEN IS ACCEPTABLE. RESPIRATORY CULTURE REPORT TO FOLLOW.   Report Status 12/28/2013 FINAL   Final  CULTURE, RESPIRATORY (NON-EXPECTORATED)     Status: None   Collection Time    12/28/13  1:47 PM      Result Value Ref Range Status   Specimen Description SPUTUM   Final   Special Requests NONE   Final   Gram Stain     Final   Value: ABUNDANT WBC PRESENT,BOTH PMN AND MONONUCLEAR     RARE SQUAMOUS EPITHELIAL CELLS PRESENT     MODERATE GRAM POSITIVE COCCI     IN PAIRS FEW GRAM NEGATIVE COCCI     Performed at [facility]   Culture     Final   Value: NORMAL OROPHARYNGEAL FLORA     Performed at [facility]   Report Status 12/30/2013 FINAL   Final     Labs: Basic Metabolic Panel:  Recent Labs Lab 12/27/13 2141 12/28/13 0406 12/28/13 0900 12/30/13 0421 12/31/13 0400 01/01/14 0644  NA 140 139  --  139 139 137  K 3.0* 3.1*  --  3.7 4.0 4.3  CL 98 99  --  101 101 91*  CO2 25 22  --  24 26 29   GLUCOSE 119* 94  --  260* 255* 100*  BUN 25* 24*  --  32* 36* 32*  CREATININE 1.47* 1.32  --  1.21 1.29 1.20  CALCIUM 8.7 8.2*  --  9.0 8.9 9.2  MG  --   --  1.2* 1.9  --   --    Liver Function Tests:  Recent Labs Lab 12/27/13 2141 12/28/13 0406  AST 44* 34  ALT 40 35  ALKPHOS  169* 149*  BILITOT 3.1* 2.6*  PROT 7.0 6.3  ALBUMIN 3.1* 2.7*   No results found for this basename: LIPASE, AMYLASE,  in the last 168 hours No results found for this basename: AMMONIA,  in the last 168 hours CBC:  Recent Labs Lab 12/27/13 2141 12/28/13 0406 12/30/13 0421  WBC 19.2* 17.8* 29.6*  NEUTROABS 16.6* 15.0*  --   HGB 15.9 14.3 14.5  HCT 44.2 41.2 41.0  MCV 87.5 90.0 88.4  PLT 322 323 400   Cardiac Enzymes:  Recent Labs Lab 12/28/13 0406 12/28/13 0900 12/28/13 1620  TROPONINI <0.30 <0.30 <0.30   BNP: BNP (last 3 results)  Recent Labs  12/27/13 2141  PROBNP 1235.0*   CBG:  Recent Labs Lab 01/01/14 1109 01/01/14 1636 01/01/14 2112  01/02/14 0602 01/02/14 1102  GLUCAP 198* 268* 229* 104* 155*       Signed:  FELIZ ORTIZ, Shakera Ebrahimi  [facility] 01/02/2014, 12:11 PM

## 2014-01-02 NOTE — Progress Notes (Signed)
SATURATION QUALIFICATIONS: (This note is used to comply with regulatory documentation for home oxygen)  Patient Saturations on Room Air at Rest = 89%  Patient Saturations on Room Air while Ambulating = 85%  Patient Saturations on 2 Liters of oxygen while Ambulating = 87-90%  Please briefly explain why patient needs home oxygen: Pt requires supplemental O2 to maintain sats >88% with mobility.  Rolinda Roan, PT, DPT Acute Rehabilitation Services Pager: [phone]

## 2014-01-02 NOTE — Progress Notes (Signed)
Physical Therapy Treatment Patient Details Name: Justin Garner MRN: [id number] DOB: [birth year] Today's Date: 01/02/2014    History of Present Illness Justin Garner is a 80 y.o. male  has a past medical history of Diabetes mellitus; Thyroid disease; Coronary artery disease; COPD (chronic obstructive pulmonary disease); GERD (gastroesophageal reflux disease); Pulmonary embolism; Hyperlipidemia; FH: colonic polyps; PAF (paroxysmal atrial fibrillation); Hypertension; ASCVD (arteriosclerotic cardiovascular disease); and Cancer. Presented with 1 week hx of chills, cough productive of yellow sputum once was a bit pink. He went to pulmonology 2 days ago and was told he likely has post nasal drip. He was started on Zertec with no improvement. Today patient developed fatigue, lethargy and worsening cough he was found to have WBC 19, fever 100.2 and A.fib w RVR up to 120 now improved after a dose of diltiazem IV. Patient have not been eating or drinking well. CXR was worrisome for PNA.        PT Comments    Pt progressing towards physical therapy goals. Pt/family education regarding safety and energy conservation at home. Pt desating during mobility on RA. During gait training, pt was placed on 2L/min supplemental O2 as sats decreased to 85%. On supplemental O2 sats remained between 87-90%. Will continue to follow.   Follow Up Recommendations  Home health PT     Equipment Recommendations  None recommended by PT    Recommendations for Other Services       Precautions / Restrictions Precautions Precautions: Fall Restrictions Weight Bearing Restrictions: No    Mobility  Bed Mobility Overal bed mobility: Needs Assistance Bed Mobility: Supine to Sit     Supine to sit: Supervision;HOB elevated     General bed mobility comments: No physical assist required. Pt able to transition EOB with increased time and use of bed rails for support. Supervision for safety.   Transfers Overall  transfer level: Needs assistance Equipment used: Rolling walker (2 wheeled) Transfers: Sit to/from Stand Sit to Stand: Supervision         General transfer comment: Pt able to power-up to full standing position without assist. RW for support once standing.   Ambulation/Gait Ambulation/Gait assistance: Min guard;Supervision Ambulation Distance (Feet): 125 Feet Assistive device: Rolling walker (2 wheeled) Gait Pattern/deviations: Step-through pattern;Decreased stride length;Trunk flexed Gait velocity: Decreased Gait velocity interpretation: Below normal speed for age/gender General Gait Details: RW for energy conservation. Pt was cued for pursed-lip breathing throughout gait training. Pt on RA when PT began gait training, and sats decreased to 85% and was put on 2L/min supplemental O2 where sats improved to 87-90% until back in room and seated.    Stairs            Wheelchair Mobility    Modified Rankin (Stroke Patients Only)       Balance Overall balance assessment: Needs assistance Sitting-balance support: Feet supported;No upper extremity supported Sitting balance-Leahy Scale: Good     Standing balance support: No upper extremity supported Standing balance-Leahy Scale: Fair                      Cognition Arousal/Alertness: Awake/alert Behavior During Therapy: Flat affect Overall Cognitive Status: Within Functional Limits for tasks assessed                      Exercises General Exercises - Lower Extremity Ankle Circles/Pumps: 20 reps Quad Sets: 15 reps Hip ABduction/ADduction: 15 reps    General Comments  Pertinent Vitals/Pain Pain Assessment: No/denies pain    Home Living                      Prior Function            PT Goals (current goals can now be found in the care plan section) Acute Rehab PT Goals Patient Stated Goal: go home PT Goal Formulation: With patient Time For Goal Achievement: 01/13/14 Potential  to Achieve Goals: Good Progress towards PT goals: Progressing toward goals    Frequency  Min 3X/week    PT Plan Current plan remains appropriate    Co-evaluation             End of Session Equipment Utilized During Treatment: Gait belt;Oxygen Activity Tolerance: Patient tolerated treatment well Patient left: in chair;with call bell/phone within reach;with chair alarm set;with family/visitor present     Time: 1224-8250 PT Time Calculation (min): 31 min  Charges:  $Gait Training: 8-22 mins $Therapeutic Exercise: 8-22 mins                    G Codes:      Rolinda Roan January 15, 2014, 12:57 PM  Rolinda Roan, PT, DPT Acute Rehabilitation Services Pager: [phone]

## 2014-01-02 NOTE — Progress Notes (Signed)
DC IV, DC Tele, DC Home. Discharge instructions and home medications discussed with patient and patient's wife. Patient and wife denied any questions or concerns at this time. Patient leaving unit via wheelchair and appears in no acute distress.

## 2014-01-02 NOTE — Progress Notes (Signed)
[facility] for Coumadin Indication: atrial fibrillation and h/o PE  No Known Allergies  Patient Measurements: Height: 6\' 1"  (185.4 cm) Weight: 190 lb 12.8 oz (86.546 kg) (Scale B) IBW/kg (Calculated) : 79.9  Vital Signs: Temp: 97.3 F (36.3 C) (10/16 0532) Temp Source: Oral (10/16 0532) BP: 124/80 mmHg (10/16 0532) Pulse Rate: 58 (10/16 0532)  Labs:  Recent Labs  12/31/13 0400 01/01/14 0644 01/02/14 0341  LABPROT 31.8* 27.7* 23.6*  INR 3.08* 2.55* 2.08*  CREATININE 1.29 1.20  --     Estimated Creatinine Clearance: 56.4 ml/min (by C-G formula based on Cr of 1.2).   Medical History: Past Medical History  Diagnosis Date  . Diabetes mellitus   . Thyroid disease   . Coronary artery disease   . COPD (chronic obstructive pulmonary disease)   . GERD (gastroesophageal reflux disease)   . Pulmonary embolism   . Hyperlipidemia   . FH: colonic polyps   . PAF (paroxysmal atrial fibrillation)   . Hypertension   . ASCVD (arteriosclerotic cardiovascular disease)   . Cancer     Medications:  Prescriptions prior to admission  Medication Sig Dispense Refill  . atorvastatin (LIPITOR) 20 MG tablet Take 20 mg by mouth every morning.      . budesonide-formoterol (SYMBICORT) 160-4.5 MCG/ACT inhaler Inhale 2 puffs into the lungs 2 (two) times daily.       Marland Kitchen diltiazem (CARDIZEM CD) 240 MG 24 hr capsule Take 240 mg by mouth daily.      . fenofibrate micronized (LOFIBRA) 67 MG capsule Take 67 mg by mouth daily before breakfast.      . fish oil-omega-3 fatty acids 1000 MG capsule Take 1 g by mouth 2 (two) times daily.       . furosemide (LASIX) 20 MG tablet Take 20 mg by mouth daily.       Marland Kitchen glipiZIDE (GLUCOTROL XL) 10 MG 24 hr tablet Take 10 mg by mouth daily with breakfast.       . JANUVIA 50 MG tablet Take 50 mg by mouth every morning.       Marland Kitchen levothyroxine (SYNTHROID, LEVOTHROID) 112 MCG tablet Take 112 mcg by mouth daily.        .  nitroGLYCERIN (NITROSTAT) 0.4 MG SL tablet Place 0.4 mg under the tongue every 5 (five) minutes as needed for chest pain.      Marland Kitchen omeprazole (PRILOSEC) 20 MG capsule Take 20 mg by mouth daily.        Marland Kitchen warfarin (COUMADIN) 5 MG tablet Take 5-7.5 mg by mouth daily. Take 5mg  on Sun, Tues, Thur, Sat Take 7.5mg  on Mon, Wed, & Fri.       Scheduled:  . aspirin  81 mg Oral Daily  . atorvastatin  20 mg Oral q morning - 10a  . budesonide (PULMICORT) nebulizer solution  0.25 mg Nebulization BID  . diltiazem  300 mg Oral Daily  . fluticasone  1 spray Each Nare Daily  . furosemide  20 mg Oral Daily  . guaiFENesin  600 mg Oral BID  . insulin aspart  0-9 Units Subcutaneous TID WC  . insulin detemir  10 Units Subcutaneous QHS  . ipratropium  0.5 mg Nebulization BID  . levofloxacin  500 mg Oral Daily  . levothyroxine  112 mcg Oral QAC breakfast  . loratadine  10 mg Oral Daily  . pantoprazole  40 mg Oral Daily  . potassium chloride  40 mEq Oral Daily  . predniSONE  40 mg Oral Q breakfast  . Warfarin - Pharmacist Dosing Inpatient   Does not apply q1800    Assessment: 81yo male c/o weakness, in Afib in ED, admitted w/ sepsis, to continue Coumadin for Afib/PE; INR has trended down to 2.08  No bleeding noted  Goal of Therapy:  INR 2-3   Plan:  Coumadin 5 mg today Daily PT/INR  Thanks for allowing pharmacy to be a part of this patient's care.  Excell Seltzer, PharmD Clinical Pharmacist, [phone] 01/02/2014,8:42 AM

## 2014-01-03 LAB — CULTURE, BLOOD (ROUTINE X 2)
Culture: NO GROWTH
Culture: NO GROWTH

## 2014-01-03 NOTE — Discharge Summary (Signed)
Physician Discharge Summary   Justin Garner QZR:[id number] DOB: 11-08-1932 DOA: 12/27/2013  PCP: Kandice Hams, MD  Admit date: 12/27/2013  Discharge date: 01/02/2014  Time spent: 35 minutes  Recommendations for Outpatient Follow-up:  1. Follow up with Cardiology next week. 2. PCP in 2 weeks. 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 male Presented with  1 week hx of chills, cough productive of yellow sputum once was a bit pink. He went to pulmonology 2 days ago and was told he likely has post nasal drip. He was started on Zertec with no improvement. Today patient developed fatigue, lethargy and worsening cough he was found to have WBC 19, fever 100.2 and A.fib w RVR up to 120 now improved after a dose of diltiazem IV. Patient have not been eating or drinking well. CXR was worrisome for PNA.  Hospital Course:  Acute respiratory failure with hypoxia Due to PNA and COPD exacerbation:  - Initially was desating started on oxygen IV steroids, antibiotics and inhalers.  - after improvement, with sat remaining > 95 %.  - steroids and antibiotics changed to oral.  - will go home on steroid tapered and Levaquin for 4 days.  CAP (community acquired pneumonia)  - Cont levaquin as an outpatient.  - see above.  A. Fib with RVR:  - Mildly tachycardic no change in diltiazem.  CAD:  - troponin neg X 3  - will continue ASA, statins   - denies CP.  HLD:  - continue statins  Diabetes:  - Continue SSI; given use of solumedrol will continue low dose levemir  - A1C 6.4  Procedures:  CXR Consultations:  none Discharge Exam:  Filed Vitals:    01/02/14 1049   BP:  113/83   Pulse:  70   Temp:    Resp:     General: A&O x3  Cardiovascular: RRR  Respiratory: good air movement CTA B/L  Discharge Instructions  You were cared for by a hospitalist during your hospital stay. If you have any questions about your discharge medications or the care you received while you were in the hospital after you are discharged, you can call the unit and asked to speak with the hospitalist on call if the hospitalist that took care of you is not available. Once you are discharged, your primary care physician will handle any further medical issues. Please note that NO REFILLS for any discharge medications will be authorized once you are discharged, as it is imperative that you return to your primary care physician (or establish a relationship with a primary care physician if you do not have one) for your aftercare needs so that they can reassess your need for medications and monitor your lab values.  Discharge Instructions    Diet - low sodium heart healthy  Complete by: As directed       Increase activity slowly  Complete by: As directed  Current Discharge Medication List     START taking these medications    Details   levofloxacin (LEVAQUIN) 500 MG tablet  Take 1 tablet (500 mg total) by mouth daily.  Qty: 3 tablet, Refills: 0     predniSONE (DELTASONE) 10 MG tablet  Takes 4 tablets for 1 days, then 3 tablets for 1 days, then 2 tabs for 1 days, then 1 tab for 1 days, and then stop.  Qty: 10 tablet, Refills: 0       CONTINUE these medications which have NOT CHANGED    Details   atorvastatin (LIPITOR) 20 MG tablet  Take 20 mg by mouth every morning.     budesonide-formoterol (SYMBICORT) 160-4.5 MCG/ACT inhaler  Inhale 2  puffs into the lungs 2 (two) times daily.     diltiazem (CARDIZEM CD) 240 MG 24 hr capsule  Take 240 mg by mouth daily.     fenofibrate micronized (LOFIBRA) 67 MG capsule  Take 67 mg by mouth daily before breakfast.     fish oil-omega-3 fatty acids 1000 MG capsule  Take 1 g by mouth 2 (two) times daily.     furosemide (LASIX) 20 MG tablet  Take 20 mg by mouth daily.     glipiZIDE (GLUCOTROL XL) 10 MG 24 hr tablet  Take 10 mg by mouth daily with breakfast.     JANUVIA 50 MG tablet  Take 50 mg by mouth every morning.     levothyroxine (SYNTHROID, LEVOTHROID) 112 MCG tablet  Take 112 mcg by mouth daily.     nitroGLYCERIN (NITROSTAT) 0.4 MG SL tablet  Place 0.4 mg under the tongue every 5 (five) minutes as needed for chest pain.     omeprazole (PRILOSEC) 20 MG capsule  Take 20 mg by mouth daily.     warfarin (COUMADIN) 5 MG tablet  Take 5-7.5 mg by mouth daily. Take 5mg  on Sun, Tues, Thur, Sat  Take 7.5mg  on Mon, Wed, & Fri.        No Known Allergies  Follow-up Information    Follow up with [facility]. (RN/PT)    Contact information:    [place] [address]  [phone]       The results of significant diagnostics from this hospitalization (including imaging, microbiology, ancillary and laboratory) are listed below for reference.   Significant Diagnostic Studies:  Dg Chest 2 View  12/28/2013 CLINICAL DATA: Acute onset of weakness and shortness of breath, worsening over the past week. Initial encounter. EXAM: CHEST 2 VIEW COMPARISON: Chest radiograph from 10/28/2013 FINDINGS: The lungs are well-aerated. There appears to be acute focal airspace consolidation at the left lung base, concerning for pneumonia. Underlying scarring is again noted at the right lung base and left lung apex, with underlying emphysematous change. No pleural effusion or pneumothorax is seen. The heart is borderline enlarged. No acute osseous abnormalities are seen.  IMPRESSION: 1. Acute focal airspace consolidation at the left lung base, concerning for pneumonia. 2. Underlying chronic lung changes noted, with bilateral emphysema. Borderline cardiomegaly. Electronically Signed By: Garald Balding M.D. On: 12/28/2013 00:20  Microbiology:  Recent Results (from the past 240 hour(s))   CULTURE, BLOOD (ROUTINE X 2) Status: None    Collection Time    12/28/13 4:06 AM   Result  Value  Ref Range  Status    Specimen Description  BLOOD LEFT HAND   Final    Special Requests  BOTTLES DRAWN AEROBIC AND ANAEROBIC [place]  Final    Culture Setup Time    Final    Value:  12/28/2013 13:42     Performed at [facility]    Culture    Final    Value:  BLOOD CULTURE RECEIVED NO GROWTH TO DATE CULTURE WILL BE HELD FOR 5 DAYS BEFORE ISSUING A FINAL NEGATIVE REPORT     Note: Culture results may be compromised due to an excessive volume of blood received in culture bottles.     Performed at [facility]    Report Status  PENDING   Incomplete   CULTURE, BLOOD (ROUTINE X 2) Status: None    Collection Time    12/28/13 4:18 AM   Result  Value  Ref Range  Status    Specimen Description  BLOOD RIGHT ARM   Final    Special Requests  BOTTLES DRAWN AEROBIC AND ANAEROBIC 10CC EACH   Final    Culture Setup Time    Final    Value:  12/28/2013 13:41     Performed at [facility]    Culture    Final    Value:  BLOOD CULTURE RECEIVED NO GROWTH TO DATE CULTURE WILL BE HELD FOR 5 DAYS BEFORE ISSUING A FINAL NEGATIVE REPORT     Note: Culture results may be compromised due to an excessive volume of blood received in culture bottles.     Performed at [facility]    Report Status  PENDING   Incomplete   CULTURE, EXPECTORATED SPUTUM-ASSESSMENT Status: None    Collection Time    12/28/13 1:47 PM   Result  Value  Ref Range  Status    Specimen Description  SPUTUM   Final    Special Requests  Normal   Final    Sputum evaluation    Final    Value:  THIS  SPECIMEN IS ACCEPTABLE. RESPIRATORY CULTURE REPORT TO FOLLOW.    Report Status  12/28/2013 FINAL   Final   CULTURE, RESPIRATORY (NON-EXPECTORATED) Status: None    Collection Time    12/28/13 1:47 PM   Result  Value  Ref Range  Status    Specimen Description  SPUTUM   Final    Special Requests  NONE   Final    Gram Stain    Final    Value:  ABUNDANT WBC PRESENT,BOTH PMN AND MONONUCLEAR     RARE SQUAMOUS EPITHELIAL CELLS PRESENT     MODERATE GRAM POSITIVE COCCI     IN PAIRS FEW GRAM NEGATIVE COCCI     Performed at [facility]    Culture    Final    Value:  NORMAL OROPHARYNGEAL FLORA     Performed at [facility]    Report Status  12/30/2013 FINAL   Final    Labs:  Basic Metabolic Panel:   Recent Labs  Lab  12/27/13 2141  12/28/13 0406  12/28/13 0900  12/30/13 0421  12/31/13 0400  01/01/14 0644   NA  140  139  --  139  139  137   K  3.0*  3.1*  --  3.7  4.0  4.3   CL  98  99  --  101  101  91*   CO2  25  22  --  24  26  29    GLUCOSE  119*  94  --  260*  255*  100*   BUN  25*  24*  --  32*  36*  32*   CREATININE  1.47*  1.32  --  1.21  1.29  1.20   CALCIUM  8.7  8.2*  --  9.0  8.9  9.2   MG  --  --  1.2*  1.9  --  --    Liver Function Tests:   Recent Labs  Lab  12/27/13 2141  12/28/13 0406   AST  44*  34   ALT  40  35   ALKPHOS  169*  149*   BILITOT  3.1*  2.6*   PROT  7.0  6.3   ALBUMIN  3.1*  2.7*    No results found for this basename: LIPASE, AMYLASE, in the last 168 hours  No results found for this basename: AMMONIA, in the last 168 hours  CBC:   Recent Labs  Lab  12/27/13 2141  12/28/13 0406  12/30/13 0421   WBC  19.2*  17.8*  29.6*   NEUTROABS  16.6*  15.0*  --   HGB  15.9  14.3  14.5   HCT  44.2  41.2  41.0   MCV  87.5  90.0  88.4   PLT  322  323  400    Cardiac Enzymes:   Recent Labs  Lab  12/28/13 0406  12/28/13 0900  12/28/13 1620   TROPONINI  <0.30  <0.30  <0.30    BNP:  BNP (last 3 results)   Recent Labs    12/27/13 2141   PROBNP  1235.0*    CBG:   Recent Labs  Lab  01/01/14 1109  01/01/14 1636  01/01/14 2112  01/02/14 0602  01/02/14 1102   GLUCAP  198*  268*  229*  104*  155*    Signed:  FELIZ ORTIZ, Sharnee Douglass  [facility]  01/02/2014, 12:11 PM

## 2014-01-05 ENCOUNTER — Ambulatory Visit: Payer: Medicare Other | Admitting: Interventional Cardiology

## 2014-01-13 ENCOUNTER — Telehealth: Payer: Self-pay | Admitting: Interventional Cardiology

## 2014-01-13 NOTE — Telephone Encounter (Signed)
The patient was recently admitted to the hospital (10/10-10/16) with cough/ elevated WBC's/ fever- diagnosed with acute respiratory failure and CAP. He was also in a-fib with RVR. He was followed by the hospitalist service. I advised Selinda Eon that his orders for PT need to come from his PCP. He has no pulmonary follow up until 04/2014. He is scheduled for follow up with Dr. Irish Lack on 12/1. I will forward a message to his scheduler to please try to get him in this week or next with Dr. Irish Lack or one of the PA's for post hospital follow up.

## 2014-01-13 NOTE — Telephone Encounter (Signed)
New message      Need orders for home PT-----one a wk for 1wk; twice a wk for 6wks; once a wk for 3 weeks--please call

## 2014-01-14 ENCOUNTER — Other Ambulatory Visit: Payer: Self-pay | Admitting: Internal Medicine

## 2014-01-14 ENCOUNTER — Ambulatory Visit (INDEPENDENT_AMBULATORY_CARE_PROVIDER_SITE_OTHER): Payer: Medicare Other | Admitting: Interventional Cardiology

## 2014-01-14 ENCOUNTER — Encounter: Payer: Self-pay | Admitting: Interventional Cardiology

## 2014-01-14 ENCOUNTER — Ambulatory Visit
Admission: RE | Admit: 2014-01-14 | Discharge: 2014-01-14 | Disposition: A | Discharge status: Home / Self Care | Payer: Medicare Other | Source: Ambulatory Visit | Attending: Internal Medicine | Admitting: Internal Medicine

## 2014-01-14 VITALS — BP 120/84 | HR 69 | Ht 73.0 in | Wt 188.0 lb

## 2014-01-14 DIAGNOSIS — J690 Pneumonitis due to inhalation of food and vomit: Secondary | ICD-10-CM

## 2014-01-14 DIAGNOSIS — I4819 Other persistent atrial fibrillation: Secondary | ICD-10-CM

## 2014-01-14 DIAGNOSIS — I481 Persistent atrial fibrillation: Secondary | ICD-10-CM

## 2014-01-14 DIAGNOSIS — I10 Essential (primary) hypertension: Secondary | ICD-10-CM

## 2014-01-14 DIAGNOSIS — I2510 Atherosclerotic heart disease of native coronary artery without angina pectoris: Secondary | ICD-10-CM

## 2014-01-14 NOTE — Progress Notes (Signed)
Patient ID: MUHAMMAD VACCA, male   DOB: May 23, 1932, 80 y.o.   MRN: [id number] Patient ID: MIKE HAMRE, male   DOB: Mar 04, 1933, 81 y.o.   MRN: [id number] Patient ID: JACON WHETZEL, male   DOB: 10/06/1932, 80 y.o.   MRN: [id number]    [address], [address], [place]  [address] Phone: [phone] Fax:  [phone]  Date:  01/14/2014   ID:  HAZEN BRUMETT, DOB [birth year], MRN [id number]  PCP:  Kandice Hams, MD      History of Present Illness: BEULAH MATUSEK is a 80 y.o. male who has CAD, AFib. Atrial Fibrillation F/U:  PTX in Dec 2012, treated with chest tube. c/o Shortness of breath at patient's baseline.  Denies : Chest pain.  Dizziness.  Leg edema.  Orthopnea.  Palpitations.  Syncope.   Was walking more in good weather.  About a month ago, he had significant orthopnea. He was found to have a pleural effusion. He underwent pleurocentesis and had significant improvement. Echocardiogram was done which showed a decrease in his left ventricular function. Previously, he had no wall motion abnormalities. On the more recent study, his EF was down to 40-45% with an inferior wall motion abnormality. He continues to deny angina, but does have shortness of breath with exertion. Back in 2003 when he had stents placed, he did not have chest pressure at that time.  Cath was done, no PCI needed.  He was hospitalized or PNA, COPD exacerbation.  He had AFib with RVR.      Wt Readings from Last 3 Encounters:  01/14/14 188 lb (85.276 kg)  01/02/14 190 lb 12.8 oz (86.546 kg)  12/24/13 198 lb 3.2 oz (89.903 kg)     Past Medical History  Diagnosis Date  . Diabetes mellitus   . Thyroid disease   . Coronary artery disease   . COPD (chronic obstructive pulmonary disease)   . GERD (gastroesophageal reflux disease)   . Pulmonary embolism   . Hyperlipidemia   . FH: colonic polyps   . PAF (paroxysmal atrial fibrillation)   . Hypertension   . ASCVD (arteriosclerotic  cardiovascular disease)   . Cancer     Current Outpatient Prescriptions  Medication Sig Dispense Refill  . atorvastatin (LIPITOR) 20 MG tablet Take 20 mg by mouth every morning.      . budesonide-formoterol (SYMBICORT) 160-4.5 MCG/ACT inhaler Inhale 2 puffs into the lungs 2 (two) times daily.       Marland Kitchen diltiazem (CARDIZEM CD) 240 MG 24 hr capsule Take 240 mg by mouth daily.      . fenofibrate micronized (LOFIBRA) 67 MG capsule Take 67 mg by mouth daily before breakfast.      . fish oil-omega-3 fatty acids 1000 MG capsule Take 1 g by mouth 2 (two) times daily.       . furosemide (LASIX) 20 MG tablet Take 20 mg by mouth daily.       Marland Kitchen glipiZIDE (GLUCOTROL XL) 10 MG 24 hr tablet Take 10 mg by mouth daily with breakfast.       . JANUVIA 50 MG tablet Take 50 mg by mouth every morning.       Marland Kitchen levothyroxine (SYNTHROID, LEVOTHROID) 112 MCG tablet Take 112 mcg by mouth daily.        . nitroGLYCERIN (NITROSTAT) 0.4 MG SL tablet Place 0.4 mg under the tongue every 5 (five) minutes as needed for chest pain.      Marland Kitchen omeprazole (PRILOSEC)  20 MG capsule Take 20 mg by mouth daily.        Marland Kitchen warfarin (COUMADIN) 5 MG tablet Take 5-7.5 mg by mouth daily. Take 5mg  on Sun, Tues, Thur, Sat Take 7.5mg  on Mon, Wed, & Fri.       No current facility-administered medications for this visit.    Allergies:   No Known Allergies  Social History:  The patient  reports that he quit smoking about 12 years ago. His smoking use included Cigarettes. He has a 50 pack-year smoking history. He has quit using smokeless tobacco. He reports that he does not drink alcohol or use illicit drugs.   Family History:  The patient's family history includes Heart disease in his father.   ROS:  Please see the history of present illness.  No nausea, vomiting.  No fevers, chills.  No focal weakness.  No dysuria. Fatigue;   All other systems reviewed and negative.   PHYSICAL EXAM: VS:  BP 120/84  Pulse 69  Ht 6\' 1"  (1.854 m)  Wt 188 lb  (85.276 kg)  BMI 24.81 kg/m2 Well nourished, well developed, in no acute distress HEENT: normal Neck: no JVD, no carotid bruits Cardiac:  normal S1, S2;irregularly, irregular Lungs:  clear to auscultation bilaterally, no wheezing, rhonchi or rales Abd: soft, nontender, no hepatomegaly Ext: no edema Skin: warm and dry Neuro:   no focal abnormalities noted  EKG:  AFib, rate controlled. NSST     2008: negative cardiolite 10/14 lipids TC 148, LDL 86; HDL 41  ASSESSMENT AND PLAN:  Atrial fibrillation  Continue Diltiazem HCl Capsule Extended Release 24 Hour, 240 MG, 1 capsule, Orally, Once a day IMAGING: EKG   Corson,Danielle 05/28/2012 11:06:06 AM > Beniah Magnan,JAY 05/28/2012 11:22:32 AM > AFib, rate controlled.   Notes: Rate controlled. COumadin for stroke prevention.  2. Coronary atherosclerosis of native coronary artery  Stopped Aspirin Tablet, 81 MG, 1 tablet, Orally, Once a day; RF NTG Continue Fenofibrate Micronized Capsule, 67 MG, 1 capsule, Orally, Once a day Notes: stents in LAD and circumflex in 2003. No significant CAD by cath in 6/15. 3. Anticoagulant monitoring  Continue Warfarin Sodium Tablet, 5 MG, per pharmD, Orally, 5 mg qd except 7.5 mg M/W Notes: INR to be checked and coumadin dose to be adjusted as needed.  Well controlled of late.   4. Essential hypertension, benign  Notes: Check BP at home. If BP is consistently more than 150/90, let us know and we can adjust medicatons.  Continue to check at home.  Controlled today. 5.  Acute systolic heart failure:  Not caused by CAD.   Signed, Mina Marble, MD, [facility] 01/14/2014 4:17 PM

## 2014-01-14 NOTE — Patient Instructions (Signed)
Your physician recommends that you continue on your current medications as directed. Please refer to the Current Medication list given to you today.  Your physician wants you to follow-up in: 6 months with Dr. Varanasi.  You will receive a reminder letter in the mail two months in advance. If you don't receive a letter, please call our office to schedule the follow-up appointment.  

## 2014-02-04 ENCOUNTER — Ambulatory Visit (INDEPENDENT_AMBULATORY_CARE_PROVIDER_SITE_OTHER): Payer: Medicare Other | Admitting: *Deleted

## 2014-02-04 DIAGNOSIS — I4891 Unspecified atrial fibrillation: Secondary | ICD-10-CM

## 2014-02-04 DIAGNOSIS — I2699 Other pulmonary embolism without acute cor pulmonale: Secondary | ICD-10-CM

## 2014-02-04 DIAGNOSIS — Z5181 Encounter for therapeutic drug level monitoring: Secondary | ICD-10-CM

## 2014-02-04 LAB — POCT INR: INR: 2

## 2014-02-17 ENCOUNTER — Ambulatory Visit: Payer: Medicare Other | Admitting: Interventional Cardiology

## 2014-02-26 ENCOUNTER — Encounter (HOSPITAL_COMMUNITY): Payer: Self-pay | Admitting: Interventional Cardiology

## 2014-03-05 ENCOUNTER — Other Ambulatory Visit: Payer: Self-pay | Admitting: Interventional Cardiology

## 2014-03-06 ENCOUNTER — Other Ambulatory Visit: Payer: Self-pay

## 2014-03-06 MED ORDER — FENOFIBRATE MICRONIZED 67 MG PO CAPS
67.0000 mg | ORAL_CAPSULE | Freq: Every day | ORAL | Status: DC
Start: 2014-03-06 — End: 2014-10-29

## 2014-03-09 ENCOUNTER — Other Ambulatory Visit: Payer: Self-pay | Admitting: Dermatology

## 2014-03-18 ENCOUNTER — Ambulatory Visit (INDEPENDENT_AMBULATORY_CARE_PROVIDER_SITE_OTHER): Payer: Medicare Other | Admitting: Pharmacist

## 2014-03-18 DIAGNOSIS — I2699 Other pulmonary embolism without acute cor pulmonale: Secondary | ICD-10-CM

## 2014-03-18 DIAGNOSIS — I4891 Unspecified atrial fibrillation: Secondary | ICD-10-CM

## 2014-03-18 DIAGNOSIS — Z5181 Encounter for therapeutic drug level monitoring: Secondary | ICD-10-CM

## 2014-03-18 LAB — POCT INR: INR: 2.2

## 2014-04-29 ENCOUNTER — Ambulatory Visit (INDEPENDENT_AMBULATORY_CARE_PROVIDER_SITE_OTHER): Payer: Medicare Other | Admitting: *Deleted

## 2014-04-29 DIAGNOSIS — Z5181 Encounter for therapeutic drug level monitoring: Secondary | ICD-10-CM

## 2014-04-29 DIAGNOSIS — I2699 Other pulmonary embolism without acute cor pulmonale: Secondary | ICD-10-CM

## 2014-04-29 DIAGNOSIS — I4891 Unspecified atrial fibrillation: Secondary | ICD-10-CM

## 2014-04-29 LAB — POCT INR: INR: 1.8

## 2014-04-30 ENCOUNTER — Ambulatory Visit (INDEPENDENT_AMBULATORY_CARE_PROVIDER_SITE_OTHER): Payer: Medicare Other | Admitting: Pulmonary Disease

## 2014-04-30 ENCOUNTER — Encounter: Payer: Self-pay | Admitting: Pulmonary Disease

## 2014-04-30 VITALS — BP 138/90 | HR 94 | Temp 97.2°F | Ht 73.0 in | Wt 201.0 lb

## 2014-04-30 DIAGNOSIS — J438 Other emphysema: Secondary | ICD-10-CM

## 2014-04-30 MED ORDER — BUDESONIDE-FORMOTEROL FUMARATE 160-4.5 MCG/ACT IN AERO: 2.0000 | INHALATION_SPRAY | Freq: Two times a day (BID) | RESPIRATORY_TRACT | Status: DC

## 2014-04-30 NOTE — Assessment & Plan Note (Signed)
The patient appears to be stable from a COPD standpoint. He tells me that he has not had an acute exacerbation or a chest infection since last visit. His breathing is staying near his usual baseline. He is having some cough that sounds from his description to be upper airway in origin, with postnasal drip being the most likely culprit. He describes a tickling in his throat and some nasal symptoms. I would like for him to try Zyrtec over-the-counter at bedtime to see if this helps. I have asked him to continue on his current medications, and to work hard on a conditioning program.

## 2014-04-30 NOTE — Patient Instructions (Signed)
Stay on symbicort, and continue to stay as active as possible You had a chest xray then end of last year that looks ok. Try taking zyrtec (cetirizine) 10mg  one at bedtime each night to see if helps tickle in throat and cough.  Will see you back in 80mos, but call if having worsening breathing issues.

## 2014-04-30 NOTE — Progress Notes (Signed)
   Subjective:    Patient ID: Justin Garner, male    DOB: 07-03-1932, 81 y.o.   MRN: [id number]  HPI The patient comes in today for follow-up of his known COPD. He has not had an acute exacerbation or pulmonary infection since his last visit, he also he is near his usual baseline from a breathing standpoint. He has had a mild cough that is clearly upper airway, and describes a tickling in his throat and some nasal symptoms as well. He is staying on his Symbicort, but is having difficulty affording the medication.   Review of Systems  Constitutional: Negative for fever and unexpected weight change.  HENT: Negative for congestion, dental problem, ear pain, nosebleeds, postnasal drip, rhinorrhea, sinus pressure, sneezing, sore throat and trouble swallowing.   Eyes: Negative for redness and itching.  Respiratory: Positive for cough and shortness of breath. Negative for chest tightness and wheezing.   Cardiovascular: Negative for palpitations and leg swelling.  Gastrointestinal: Negative for nausea and vomiting.  Genitourinary: Negative for dysuria.  Musculoskeletal: Negative for joint swelling.  Skin: Negative for rash.  Neurological: Negative for headaches.  Hematological: Does not bruise/bleed easily.  Psychiatric/Behavioral: Negative for dysphoric mood. The patient is not nervous/anxious.        Objective:   Physical Exam Well-developed male in no acute distress Nose without purulence or discharge noted Neck without lymphadenopathy or thyromegaly Chest with decreased breath sounds, but adequate airflow and no wheezing Cardiac exam with regular rate and rhythm Lower extremities without edema, no cyanosis Alert and oriented, moves all 4 extremities.       Assessment & Plan:

## 2014-05-27 ENCOUNTER — Ambulatory Visit (INDEPENDENT_AMBULATORY_CARE_PROVIDER_SITE_OTHER): Payer: Medicare Other | Admitting: *Deleted

## 2014-05-27 DIAGNOSIS — I4891 Unspecified atrial fibrillation: Secondary | ICD-10-CM

## 2014-05-27 DIAGNOSIS — I2699 Other pulmonary embolism without acute cor pulmonale: Secondary | ICD-10-CM

## 2014-05-27 DIAGNOSIS — Z5181 Encounter for therapeutic drug level monitoring: Secondary | ICD-10-CM

## 2014-05-27 LAB — POCT INR: INR: 1.9

## 2014-06-10 ENCOUNTER — Ambulatory Visit (INDEPENDENT_AMBULATORY_CARE_PROVIDER_SITE_OTHER): Payer: Medicare Other | Admitting: *Deleted

## 2014-06-10 DIAGNOSIS — I4891 Unspecified atrial fibrillation: Secondary | ICD-10-CM

## 2014-06-10 DIAGNOSIS — I2699 Other pulmonary embolism without acute cor pulmonale: Secondary | ICD-10-CM | POA: Diagnosis not present

## 2014-06-10 DIAGNOSIS — Z5181 Encounter for therapeutic drug level monitoring: Secondary | ICD-10-CM | POA: Diagnosis not present

## 2014-06-10 LAB — POCT INR: INR: 2.6

## 2014-06-15 ENCOUNTER — Other Ambulatory Visit: Payer: Self-pay | Admitting: *Deleted

## 2014-06-15 ENCOUNTER — Telehealth: Payer: Self-pay | Admitting: *Deleted

## 2014-06-15 MED ORDER — ATORVASTATIN CALCIUM 20 MG PO TABS: ORAL_TABLET | ORAL | Status: DC

## 2014-06-15 NOTE — Telephone Encounter (Signed)
Wants refill on coumadin.Marland Kitchen

## 2014-06-15 NOTE — Telephone Encounter (Signed)
Error

## 2014-06-16 MED ORDER — WARFARIN SODIUM 5 MG PO TABS: ORAL_TABLET | ORAL | Status: DC

## 2014-07-01 ENCOUNTER — Ambulatory Visit (INDEPENDENT_AMBULATORY_CARE_PROVIDER_SITE_OTHER): Payer: Medicare Other | Admitting: *Deleted

## 2014-07-01 DIAGNOSIS — I2699 Other pulmonary embolism without acute cor pulmonale: Secondary | ICD-10-CM | POA: Diagnosis not present

## 2014-07-01 DIAGNOSIS — Z5181 Encounter for therapeutic drug level monitoring: Secondary | ICD-10-CM

## 2014-07-01 DIAGNOSIS — I4891 Unspecified atrial fibrillation: Secondary | ICD-10-CM | POA: Diagnosis not present

## 2014-07-01 LAB — POCT INR: INR: 2.5

## 2014-07-27 ENCOUNTER — Ambulatory Visit (INDEPENDENT_AMBULATORY_CARE_PROVIDER_SITE_OTHER): Payer: Medicare Other

## 2014-07-27 ENCOUNTER — Encounter: Payer: Self-pay | Admitting: Interventional Cardiology

## 2014-07-27 ENCOUNTER — Ambulatory Visit (INDEPENDENT_AMBULATORY_CARE_PROVIDER_SITE_OTHER): Payer: Medicare Other | Admitting: Interventional Cardiology

## 2014-07-27 VITALS — BP 132/78 | HR 54 | Ht 73.0 in | Wt 192.0 lb

## 2014-07-27 DIAGNOSIS — Z5181 Encounter for therapeutic drug level monitoring: Secondary | ICD-10-CM

## 2014-07-27 DIAGNOSIS — I4891 Unspecified atrial fibrillation: Secondary | ICD-10-CM

## 2014-07-27 DIAGNOSIS — I2510 Atherosclerotic heart disease of native coronary artery without angina pectoris: Secondary | ICD-10-CM | POA: Diagnosis not present

## 2014-07-27 DIAGNOSIS — I2699 Other pulmonary embolism without acute cor pulmonale: Secondary | ICD-10-CM | POA: Diagnosis not present

## 2014-07-27 DIAGNOSIS — I10 Essential (primary) hypertension: Secondary | ICD-10-CM | POA: Diagnosis not present

## 2014-07-27 LAB — POCT INR: INR: 3.2

## 2014-07-27 NOTE — Progress Notes (Signed)
Patient ID: Justin Garner, male   DOB: October 08, 1932, 81 y.o.   MRN: [id number]      [address], [address] [place], [place]  [address] Phone: [phone] Fax:  [phone]  Date:  07/27/2014   ID:  Justin Garner, DOB Aug 01, 1932, MRN [id number]  PCP:  Kandice Hams, MD      History of Present Illness: [name] is a 81 y.o. male who has CAD, AFib. Atrial Fibrillation F/U:  PTX in Dec 2012, treated with chest tube. c/o Shortness of breath at patient's baseline.  Denies : Chest pain.  Dizziness.  Leg edema.  Orthopnea.  Palpitations.  Syncope.   He continues to deny angina, but does have shortness of breath with exertion. Back in 2003 when he had stents placed, he did not have chest pressure at that time.  Cath was done in 2014 due to abnormal echo. no PCI needed in 2015 June.  He has chronic back pain and a chronic cough since PNA in 10/15.     He thinks this is made worse by Symbicort.    Wt Readings from Last 3 Encounters:  07/27/14 192 lb (87.091 kg)  04/30/14 201 lb (91.173 kg)  01/14/14 188 lb (85.276 kg)     Past Medical History  Diagnosis Date  . Diabetes mellitus   . Thyroid disease   . Coronary artery disease   . COPD (chronic obstructive pulmonary disease)   . GERD (gastroesophageal reflux disease)   . Pulmonary embolism   . Hyperlipidemia   . FH: colonic polyps   . PAF (paroxysmal atrial fibrillation)   . Hypertension   . ASCVD (arteriosclerotic cardiovascular disease)   . Cancer     Current Outpatient Prescriptions  Medication Sig Dispense Refill  . atorvastatin (LIPITOR) 20 MG tablet TAKE 1 BY MOUTH DAILY 90 tablet 2  . budesonide-formoterol (SYMBICORT) 160-4.5 MCG/ACT inhaler Inhale 2 puffs into the lungs 2 (two) times daily. 1 Inhaler 0  . diltiazem (CARDIZEM CD) 240 MG 24 hr capsule Take 240 mg by mouth daily.    . fenofibrate micronized (LOFIBRA) 67 MG capsule Take 1 capsule (67 mg total) by mouth daily before breakfast.  90 capsule 1  . fish oil-omega-3 fatty acids 1000 MG capsule Take 1 g by mouth 2 (two) times daily.     . furosemide (LASIX) 20 MG tablet Take 20 mg by mouth daily.     Marland Kitchen glipiZIDE (GLUCOTROL XL) 10 MG 24 hr tablet Take 10 mg by mouth daily with breakfast.     . JANUVIA 50 MG tablet Take 50 mg by mouth every morning.     Marland Kitchen levothyroxine (SYNTHROID, LEVOTHROID) 112 MCG tablet Take 112 mcg by mouth daily.      . nitroGLYCERIN (NITROSTAT) 0.4 MG SL tablet Place 0.4 mg under the tongue every 5 (five) minutes as needed for chest pain.    Marland Kitchen omeprazole (PRILOSEC) 20 MG capsule Take 20 mg by mouth daily.      Marland Kitchen warfarin (COUMADIN) 5 MG tablet Take as directed by [facility] 135 tablet 1   No current facility-administered medications for this visit.    Allergies:   No Known Allergies  Social History:  The patient  reports that he quit smoking about 13 years ago. His smoking use included Cigarettes. He has a 50 pack-year smoking history. He has quit using smokeless tobacco. He reports that he does not drink alcohol or use illicit drugs.   Family History:  The patient's family history includes Heart disease in his father.   ROS:  Please see the history of present illness.  No nausea, vomiting.  No fevers, chills.  No focal weakness.  No dysuria. Fatigue;   All other systems reviewed and negative.   PHYSICAL EXAM: VS:  BP 132/78 mmHg  Pulse 54  Ht '6\' 1"'$  (1.854 m)  Wt 192 lb (87.091 kg)  BMI 25.34 kg/m2  SpO2 96% Well nourished, well developed, in no acute distress HEENT: normal Neck: no JVD, no carotid bruits Cardiac:  normal S1, S2;irregularly, irregular Lungs:  clear to auscultation bilaterally, no wheezing, rhonchi or rales Abd: soft, nontender, no hepatomegaly Ext: no edema Skin: warm and dry Neuro:   no focal abnormalities noted Psych: flat affect  EKG:  AFib, rate controlled. NSST     2008: negative cardiolite 10/14 lipids TC 148, LDL 86; HDL 41  ASSESSMENT AND  PLAN:  Atrial fibrillation  Continue Diltiazem HCl Capsule Extended Release 24 Hour, 240 MG, 1 capsule, Orally, Once a day   Notes: Rate controlled. COumadin for stroke prevention.  HR when checked apically is higher than the peripheral pulse.  It is in the 90s in the office today.  He is not symptomatic. 2. Coronary atherosclerosis of native coronary artery  Stopped Aspirin Tablet, 81 MG, 1 tablet, Orally, Once a day; RF NTG Continue Fenofibrate Micronized Capsule, 67 MG, 1 capsule, Orally, Once a day Notes: stents in LAD and circumflex in 2003. No significant CAD by cath in 6/15.  Will refill NTG when needed. 3. Anticoagulant monitoring  Continue Warfarin Sodium Tablet, 5 MG, per pharmD, Orally, 5 mg qd except 7.5 mg M/W Notes: INR to be checked and coumadin dose adjusted as it was high, 3.2.  Well controlled of late for the most part.   4. Essential hypertension, benign  Notes: Check BP at home. If BP is consistently more than 150/90, let us know and we can adjust medicatons.  Continue to check at home.  Controlled today.  He does not check it at home. 5.  Acute systolic heart failure:  Not caused by CAD, based on cath in 6/15. 6. Cough: He thinks it was made worse by Symbicort.  Signed, Mina Marble, MD, [facility] 07/27/2014 4:58 PM

## 2014-07-27 NOTE — Patient Instructions (Signed)
**Note De-Identified  Obfuscation** Medication Instructions:  Same  Labwork: None  Testing/Procedures: None  Follow-Up: Your physician wants you to follow-up in: 1 year. You will receive a reminder letter in the mail two months in advance. If you don't receive a letter, please call our office to schedule the follow-up appointment.

## 2014-07-29 MED ORDER — DILTIAZEM HCL ER COATED BEADS 240 MG PO CP24: 240.0000 mg | ORAL_CAPSULE | Freq: Every day | ORAL | Status: DC

## 2014-08-19 ENCOUNTER — Ambulatory Visit (INDEPENDENT_AMBULATORY_CARE_PROVIDER_SITE_OTHER): Payer: Medicare Other | Admitting: *Deleted

## 2014-08-19 DIAGNOSIS — I2699 Other pulmonary embolism without acute cor pulmonale: Secondary | ICD-10-CM | POA: Diagnosis not present

## 2014-08-19 DIAGNOSIS — Z5181 Encounter for therapeutic drug level monitoring: Secondary | ICD-10-CM | POA: Diagnosis not present

## 2014-08-19 DIAGNOSIS — I4891 Unspecified atrial fibrillation: Secondary | ICD-10-CM | POA: Diagnosis not present

## 2014-08-19 LAB — POCT INR: INR: 3.4

## 2014-08-21 ENCOUNTER — Ambulatory Visit
Admission: RE | Admit: 2014-08-21 | Discharge: 2014-08-21 | Disposition: A | Discharge status: Home / Self Care | Payer: Medicare Other | Source: Ambulatory Visit | Attending: Internal Medicine | Admitting: Internal Medicine

## 2014-08-21 ENCOUNTER — Other Ambulatory Visit: Payer: Self-pay | Admitting: Internal Medicine

## 2014-08-21 DIAGNOSIS — R042 Hemoptysis: Secondary | ICD-10-CM

## 2014-09-02 ENCOUNTER — Ambulatory Visit (INDEPENDENT_AMBULATORY_CARE_PROVIDER_SITE_OTHER): Payer: Medicare Other | Admitting: *Deleted

## 2014-09-02 DIAGNOSIS — I4891 Unspecified atrial fibrillation: Secondary | ICD-10-CM

## 2014-09-02 DIAGNOSIS — Z5181 Encounter for therapeutic drug level monitoring: Secondary | ICD-10-CM | POA: Diagnosis not present

## 2014-09-02 DIAGNOSIS — I2699 Other pulmonary embolism without acute cor pulmonale: Secondary | ICD-10-CM

## 2014-09-02 LAB — POCT INR: INR: 2.8

## 2014-09-14 ENCOUNTER — Other Ambulatory Visit: Payer: Self-pay

## 2014-09-23 ENCOUNTER — Ambulatory Visit (INDEPENDENT_AMBULATORY_CARE_PROVIDER_SITE_OTHER): Payer: Medicare Other | Admitting: *Deleted

## 2014-09-23 DIAGNOSIS — Z5181 Encounter for therapeutic drug level monitoring: Secondary | ICD-10-CM

## 2014-09-23 DIAGNOSIS — I2699 Other pulmonary embolism without acute cor pulmonale: Secondary | ICD-10-CM

## 2014-09-23 DIAGNOSIS — I4891 Unspecified atrial fibrillation: Secondary | ICD-10-CM | POA: Diagnosis not present

## 2014-09-23 LAB — POCT INR: INR: 2.2

## 2014-10-21 ENCOUNTER — Ambulatory Visit (INDEPENDENT_AMBULATORY_CARE_PROVIDER_SITE_OTHER): Payer: Medicare Other | Admitting: Surgery

## 2014-10-21 DIAGNOSIS — I2699 Other pulmonary embolism without acute cor pulmonale: Secondary | ICD-10-CM | POA: Diagnosis not present

## 2014-10-21 DIAGNOSIS — I4891 Unspecified atrial fibrillation: Secondary | ICD-10-CM

## 2014-10-21 DIAGNOSIS — Z5181 Encounter for therapeutic drug level monitoring: Secondary | ICD-10-CM | POA: Diagnosis not present

## 2014-10-21 LAB — POCT INR: INR: 2.2

## 2014-10-29 ENCOUNTER — Encounter: Payer: Self-pay | Admitting: Emergency Medicine

## 2014-10-29 ENCOUNTER — Ambulatory Visit: Payer: Medicare Other | Admitting: Pulmonary Disease

## 2014-10-29 ENCOUNTER — Ambulatory Visit (INDEPENDENT_AMBULATORY_CARE_PROVIDER_SITE_OTHER): Payer: Medicare Other | Admitting: Emergency Medicine

## 2014-10-29 VITALS — BP 104/68 | HR 79 | Ht 74.0 in | Wt 198.0 lb

## 2014-10-29 DIAGNOSIS — C3411 Malignant neoplasm of upper lobe, right bronchus or lung: Secondary | ICD-10-CM

## 2014-10-29 DIAGNOSIS — J438 Other emphysema: Secondary | ICD-10-CM

## 2014-10-29 MED ORDER — ALBUTEROL SULFATE HFA 108 (90 BASE) MCG/ACT IN AERS: 2.0000 | INHALATION_SPRAY | Freq: Four times a day (QID) | RESPIRATORY_TRACT | Status: DC | PRN

## 2014-10-29 NOTE — Patient Instructions (Signed)
Please continue to keep your albuterol available to use 2 puffs whenever needed for shortness of breath. You may want to try using this 10 minutes before exertion Stay off the Symbicort We will not start an additional inhaler at this time Continue your other medicines as prescribed Follow with Dr Lamonte Sakai in 6 months or sooner if you have any problems

## 2014-10-29 NOTE — Assessment & Plan Note (Signed)
He continues on Coumadin. Has an IVC filter

## 2014-10-29 NOTE — Assessment & Plan Note (Signed)
Patient is been on Spiriva, Incruse, and now has also tried Symbicort. He stopped the Symbicort due to cough. He has albuterol that he uses rarely. I discussed when necessary versus maintenance medication with him today. For now he would like to remain on SABA prn alone.

## 2014-10-29 NOTE — Progress Notes (Signed)
Subjective:    Patient ID: Justin Garner, male    DOB: 02-16-1933, 81 y.o.   MRN: [id number]  HPI 81 year old man, former smoker (50 pack years), history of diabetes, coronary artery disease, GERD, lung cancer status post right upper lobectomy in 2009 and then chemo, atrial fibrillation and pulmonary embolism with IVC filter on coumadin. He has had spontaneously PTX's. He has been followed by Dr Gwenette Greet for COPD. I personally reviewed his pulmonary function testing from 2010 which showed mild obstruction and no bronchodilator response. He has been managed on Symbicort but he stopped it a few months ago due to cough. His cough got better.  He has SABA available, rarely uses.  He reports that his breathing has been doing well, he is able to do yard work. He gets SOB with climbing a hill.  Rare cough. No wheeze.     Review of Systems As per HPI   Past Medical History  Diagnosis Date  . Diabetes mellitus   . Thyroid disease   . Coronary artery disease   . COPD (chronic obstructive pulmonary disease)   . GERD (gastroesophageal reflux disease)   . Pulmonary embolism   . Hyperlipidemia   . FH: colonic polyps   . PAF (paroxysmal atrial fibrillation)   . Hypertension   . ASCVD (arteriosclerotic cardiovascular disease)   . Cancer      Family History  Problem Relation Age of Onset  . Heart disease Father      Social History   Social History  . Marital Status: Married    Spouse Name: N/A  . Number of Children: N/A  . Years of Education: N/A   Occupational History  . retired    Social History Main Topics  . Smoking status: Former Smoker -- 1.00 packs/day for 50 years    Types: Cigarettes    Quit date: 03/20/2001  . Smokeless tobacco: Former Systems developer  . Alcohol Use: No  . Drug Use: No  . Sexual Activity: Not on file   Other Topics Concern  . Not on file   Social History Narrative     No Known Allergies   Outpatient Prescriptions Prior to Visit  Medication Sig Dispense  Refill  . atorvastatin (LIPITOR) 20 MG tablet TAKE 1 BY MOUTH DAILY 90 tablet 2  . diltiazem (CARDIZEM CD) 240 MG 24 hr capsule Take 1 capsule (240 mg total) by mouth daily. 90 capsule 3  . fish oil-omega-3 fatty acids 1000 MG capsule Take 1 g by mouth 2 (two) times daily.     . furosemide (LASIX) 20 MG tablet Take 20 mg by mouth daily.     Marland Kitchen glipiZIDE (GLUCOTROL XL) 10 MG 24 hr tablet Take 10 mg by mouth daily with breakfast.     . levothyroxine (SYNTHROID, LEVOTHROID) 112 MCG tablet Take 112 mcg by mouth daily.      . nitroGLYCERIN (NITROSTAT) 0.4 MG SL tablet Place 0.4 mg under the tongue every 5 (five) minutes as needed for chest pain.    Marland Kitchen omeprazole (PRILOSEC) 20 MG capsule Take 20 mg by mouth daily.      Marland Kitchen warfarin (COUMADIN) 5 MG tablet Take as directed by [facility] 135 tablet 1  . budesonide-formoterol (SYMBICORT) 160-4.5 MCG/ACT inhaler Inhale 2 puffs into the lungs 2 (two) times daily. 1 Inhaler 0  . JANUVIA 50 MG tablet Take 50 mg by mouth every morning.     . fenofibrate micronized (LOFIBRA) 67 MG capsule Take 1 capsule (67 mg  total) by mouth daily before breakfast. 90 capsule 1   No facility-administered medications prior to visit.         Objective:   Physical Exam Filed Vitals:   10/29/14 1630  BP: 104/68  Pulse: 79  Height: '6\' 2"'$  (1.88 m)  Weight: 198 lb (89.812 kg)  SpO2: 96%   Gen: Pleasant, tall, kyphotic, well-nourished, in no distress,  normal affect  ENT: No lesions,  mouth clear,  oropharynx clear, no postnasal drip  Neck: No JVD, no TMG, no carotid bruits  Lungs: No use of accessory muscles, distant, clear without rales or rhonchi  Cardiovascular: RRR, heart sounds normal, no murmur or gallops, no peripheral edema  Musculoskeletal: No deformities, no cyanosis or clubbing  Neuro: alert, non focal  Skin: Warm, no lesions or rashes       Assessment & Plan:  COPD (chronic obstructive pulmonary disease) with emphysema Patient is been on  Spiriva, Incruse, and now has also tried Symbicort. He stopped the Symbicort due to cough. He has albuterol that he uses rarely. I discussed when necessary versus maintenance medication with him today. For now he would like to remain on SABA prn alone.   Lung cancer, upper lobe No longer requiring therapy. Disease-free for 7 years  Other pulmonary embolism and infarction He continues on Coumadin. Has an IVC filter

## 2014-10-29 NOTE — Assessment & Plan Note (Signed)
No longer requiring therapy. Disease-free for 7 years

## 2014-12-02 ENCOUNTER — Ambulatory Visit (INDEPENDENT_AMBULATORY_CARE_PROVIDER_SITE_OTHER): Payer: Medicare Other | Admitting: *Deleted

## 2014-12-02 DIAGNOSIS — I4891 Unspecified atrial fibrillation: Secondary | ICD-10-CM | POA: Diagnosis not present

## 2014-12-02 DIAGNOSIS — Z5181 Encounter for therapeutic drug level monitoring: Secondary | ICD-10-CM | POA: Diagnosis not present

## 2014-12-02 DIAGNOSIS — I2699 Other pulmonary embolism without acute cor pulmonale: Secondary | ICD-10-CM | POA: Diagnosis not present

## 2014-12-02 LAB — POCT INR: INR: 1.9

## 2015-01-06 ENCOUNTER — Ambulatory Visit (INDEPENDENT_AMBULATORY_CARE_PROVIDER_SITE_OTHER): Payer: Medicare Other | Admitting: *Deleted

## 2015-01-06 DIAGNOSIS — Z5181 Encounter for therapeutic drug level monitoring: Secondary | ICD-10-CM | POA: Diagnosis not present

## 2015-01-06 DIAGNOSIS — I4891 Unspecified atrial fibrillation: Secondary | ICD-10-CM | POA: Diagnosis not present

## 2015-01-06 DIAGNOSIS — I2699 Other pulmonary embolism without acute cor pulmonale: Secondary | ICD-10-CM

## 2015-01-06 LAB — POCT INR: INR: 1.6

## 2015-01-20 ENCOUNTER — Ambulatory Visit (INDEPENDENT_AMBULATORY_CARE_PROVIDER_SITE_OTHER): Payer: Medicare Other | Admitting: *Deleted

## 2015-01-20 DIAGNOSIS — I2699 Other pulmonary embolism without acute cor pulmonale: Secondary | ICD-10-CM

## 2015-01-20 DIAGNOSIS — I4891 Unspecified atrial fibrillation: Secondary | ICD-10-CM

## 2015-01-20 DIAGNOSIS — Z5181 Encounter for therapeutic drug level monitoring: Secondary | ICD-10-CM | POA: Diagnosis not present

## 2015-01-20 LAB — POCT INR: INR: 1.8

## 2015-01-26 ENCOUNTER — Other Ambulatory Visit: Payer: Self-pay | Admitting: *Deleted

## 2015-01-26 MED ORDER — WARFARIN SODIUM 5 MG PO TABS: ORAL_TABLET | ORAL | Status: DC

## 2015-02-03 ENCOUNTER — Ambulatory Visit (INDEPENDENT_AMBULATORY_CARE_PROVIDER_SITE_OTHER): Payer: Medicare Other | Admitting: *Deleted

## 2015-02-03 DIAGNOSIS — Z5181 Encounter for therapeutic drug level monitoring: Secondary | ICD-10-CM | POA: Diagnosis not present

## 2015-02-03 DIAGNOSIS — I4891 Unspecified atrial fibrillation: Secondary | ICD-10-CM | POA: Diagnosis not present

## 2015-02-03 DIAGNOSIS — I2699 Other pulmonary embolism without acute cor pulmonale: Secondary | ICD-10-CM | POA: Diagnosis not present

## 2015-02-03 LAB — POCT INR: INR: 1.8

## 2015-02-17 ENCOUNTER — Ambulatory Visit (INDEPENDENT_AMBULATORY_CARE_PROVIDER_SITE_OTHER): Payer: Medicare Other | Admitting: *Deleted

## 2015-02-17 DIAGNOSIS — I2699 Other pulmonary embolism without acute cor pulmonale: Secondary | ICD-10-CM | POA: Diagnosis not present

## 2015-02-17 DIAGNOSIS — I4891 Unspecified atrial fibrillation: Secondary | ICD-10-CM

## 2015-02-17 DIAGNOSIS — Z5181 Encounter for therapeutic drug level monitoring: Secondary | ICD-10-CM | POA: Diagnosis not present

## 2015-02-17 LAB — POCT INR: INR: 1.7

## 2015-03-03 ENCOUNTER — Ambulatory Visit (INDEPENDENT_AMBULATORY_CARE_PROVIDER_SITE_OTHER): Payer: Medicare Other | Admitting: *Deleted

## 2015-03-03 DIAGNOSIS — I4891 Unspecified atrial fibrillation: Secondary | ICD-10-CM | POA: Diagnosis not present

## 2015-03-03 DIAGNOSIS — Z5181 Encounter for therapeutic drug level monitoring: Secondary | ICD-10-CM

## 2015-03-03 DIAGNOSIS — I2699 Other pulmonary embolism without acute cor pulmonale: Secondary | ICD-10-CM

## 2015-03-03 LAB — POCT INR: INR: 1.9

## 2015-03-18 ENCOUNTER — Ambulatory Visit (INDEPENDENT_AMBULATORY_CARE_PROVIDER_SITE_OTHER): Payer: Medicare Other | Admitting: *Deleted

## 2015-03-18 DIAGNOSIS — Z5181 Encounter for therapeutic drug level monitoring: Secondary | ICD-10-CM | POA: Diagnosis not present

## 2015-03-18 DIAGNOSIS — I4891 Unspecified atrial fibrillation: Secondary | ICD-10-CM

## 2015-03-18 DIAGNOSIS — I2699 Other pulmonary embolism without acute cor pulmonale: Secondary | ICD-10-CM

## 2015-03-18 LAB — POCT INR: INR: 2.5

## 2015-04-07 ENCOUNTER — Ambulatory Visit (INDEPENDENT_AMBULATORY_CARE_PROVIDER_SITE_OTHER): Payer: Medicare Other | Admitting: *Deleted

## 2015-04-07 DIAGNOSIS — Z5181 Encounter for therapeutic drug level monitoring: Secondary | ICD-10-CM | POA: Diagnosis not present

## 2015-04-07 DIAGNOSIS — I2699 Other pulmonary embolism without acute cor pulmonale: Secondary | ICD-10-CM

## 2015-04-07 DIAGNOSIS — I4891 Unspecified atrial fibrillation: Secondary | ICD-10-CM | POA: Diagnosis not present

## 2015-04-07 LAB — POCT INR: INR: 2.3

## 2015-04-26 ENCOUNTER — Other Ambulatory Visit: Payer: Self-pay | Admitting: *Deleted

## 2015-04-26 MED ORDER — ATORVASTATIN CALCIUM 20 MG PO TABS: ORAL_TABLET | ORAL | Status: DC

## 2015-04-26 MED ORDER — WARFARIN SODIUM 5 MG PO TABS: ORAL_TABLET | ORAL | Status: DC

## 2015-04-26 NOTE — Telephone Encounter (Signed)
pt gave verbal permission to speak to the wife, informed her to have active diltizem Rx moved from [facility] to [facility], also sent in the Atorvastatin & sent to Coumadin for Warfarin refill.

## 2015-05-05 ENCOUNTER — Ambulatory Visit (INDEPENDENT_AMBULATORY_CARE_PROVIDER_SITE_OTHER): Payer: Medicare Other | Admitting: *Deleted

## 2015-05-05 DIAGNOSIS — I4891 Unspecified atrial fibrillation: Secondary | ICD-10-CM

## 2015-05-05 DIAGNOSIS — I2699 Other pulmonary embolism without acute cor pulmonale: Secondary | ICD-10-CM | POA: Diagnosis not present

## 2015-05-05 DIAGNOSIS — Z5181 Encounter for therapeutic drug level monitoring: Secondary | ICD-10-CM

## 2015-05-05 LAB — POCT INR: INR: 2.6

## 2015-06-02 ENCOUNTER — Ambulatory Visit (INDEPENDENT_AMBULATORY_CARE_PROVIDER_SITE_OTHER): Payer: Medicare Other | Admitting: Pharmacist

## 2015-06-02 DIAGNOSIS — I2699 Other pulmonary embolism without acute cor pulmonale: Secondary | ICD-10-CM

## 2015-06-02 DIAGNOSIS — I4891 Unspecified atrial fibrillation: Secondary | ICD-10-CM | POA: Diagnosis not present

## 2015-06-02 DIAGNOSIS — Z5181 Encounter for therapeutic drug level monitoring: Secondary | ICD-10-CM | POA: Diagnosis not present

## 2015-06-02 LAB — POCT INR: INR: 2.8

## 2015-06-13 ENCOUNTER — Encounter (HOSPITAL_COMMUNITY): Payer: Self-pay

## 2015-06-13 ENCOUNTER — Emergency Department (HOSPITAL_COMMUNITY): Payer: Medicare Other

## 2015-06-13 ENCOUNTER — Inpatient Hospital Stay (HOSPITAL_COMMUNITY)
Admission: EM | Admit: 2015-06-13 | Discharge: 2015-06-25 | DRG: 190 | Disposition: A | Discharge status: Home / Self Care | Payer: Medicare Other | Attending: Internal Medicine | Admitting: Internal Medicine

## 2015-06-13 DIAGNOSIS — J441 Chronic obstructive pulmonary disease with (acute) exacerbation: Secondary | ICD-10-CM | POA: Diagnosis present

## 2015-06-13 DIAGNOSIS — J209 Acute bronchitis, unspecified: Secondary | ICD-10-CM | POA: Diagnosis present

## 2015-06-13 DIAGNOSIS — I509 Heart failure, unspecified: Secondary | ICD-10-CM | POA: Diagnosis not present

## 2015-06-13 DIAGNOSIS — K219 Gastro-esophageal reflux disease without esophagitis: Secondary | ICD-10-CM | POA: Diagnosis present

## 2015-06-13 DIAGNOSIS — Z7901 Long term (current) use of anticoagulants: Secondary | ICD-10-CM | POA: Diagnosis not present

## 2015-06-13 DIAGNOSIS — R319 Hematuria, unspecified: Secondary | ICD-10-CM | POA: Diagnosis present

## 2015-06-13 DIAGNOSIS — Z8249 Family history of ischemic heart disease and other diseases of the circulatory system: Secondary | ICD-10-CM | POA: Diagnosis not present

## 2015-06-13 DIAGNOSIS — Z66 Do not resuscitate: Secondary | ICD-10-CM | POA: Diagnosis not present

## 2015-06-13 DIAGNOSIS — Z85118 Personal history of other malignant neoplasm of bronchus and lung: Secondary | ICD-10-CM

## 2015-06-13 DIAGNOSIS — Z9221 Personal history of antineoplastic chemotherapy: Secondary | ICD-10-CM | POA: Diagnosis not present

## 2015-06-13 DIAGNOSIS — J438 Other emphysema: Secondary | ICD-10-CM

## 2015-06-13 DIAGNOSIS — J90 Pleural effusion, not elsewhere classified: Secondary | ICD-10-CM | POA: Diagnosis not present

## 2015-06-13 DIAGNOSIS — Z87891 Personal history of nicotine dependence: Secondary | ICD-10-CM | POA: Diagnosis not present

## 2015-06-13 DIAGNOSIS — I482 Chronic atrial fibrillation, unspecified: Secondary | ICD-10-CM | POA: Diagnosis present

## 2015-06-13 DIAGNOSIS — N390 Urinary tract infection, site not specified: Secondary | ICD-10-CM | POA: Diagnosis present

## 2015-06-13 DIAGNOSIS — D72829 Elevated white blood cell count, unspecified: Secondary | ICD-10-CM | POA: Insufficient documentation

## 2015-06-13 DIAGNOSIS — R059 Cough, unspecified: Secondary | ICD-10-CM

## 2015-06-13 DIAGNOSIS — Z7984 Long term (current) use of oral hypoglycemic drugs: Secondary | ICD-10-CM

## 2015-06-13 DIAGNOSIS — Z794 Long term (current) use of insulin: Secondary | ICD-10-CM

## 2015-06-13 DIAGNOSIS — R634 Abnormal weight loss: Secondary | ICD-10-CM | POA: Diagnosis present

## 2015-06-13 DIAGNOSIS — I5022 Chronic systolic (congestive) heart failure: Secondary | ICD-10-CM | POA: Diagnosis present

## 2015-06-13 DIAGNOSIS — E039 Hypothyroidism, unspecified: Secondary | ICD-10-CM | POA: Diagnosis present

## 2015-06-13 DIAGNOSIS — E872 Acidosis: Secondary | ICD-10-CM | POA: Diagnosis present

## 2015-06-13 DIAGNOSIS — R918 Other nonspecific abnormal finding of lung field: Secondary | ICD-10-CM | POA: Diagnosis not present

## 2015-06-13 DIAGNOSIS — I10 Essential (primary) hypertension: Secondary | ICD-10-CM | POA: Diagnosis present

## 2015-06-13 DIAGNOSIS — I429 Cardiomyopathy, unspecified: Secondary | ICD-10-CM | POA: Diagnosis present

## 2015-06-13 DIAGNOSIS — J9601 Acute respiratory failure with hypoxia: Secondary | ICD-10-CM | POA: Diagnosis present

## 2015-06-13 DIAGNOSIS — R509 Fever, unspecified: Secondary | ICD-10-CM

## 2015-06-13 DIAGNOSIS — E860 Dehydration: Secondary | ICD-10-CM | POA: Diagnosis present

## 2015-06-13 DIAGNOSIS — E1122 Type 2 diabetes mellitus with diabetic chronic kidney disease: Secondary | ICD-10-CM | POA: Diagnosis present

## 2015-06-13 DIAGNOSIS — R05 Cough: Secondary | ICD-10-CM

## 2015-06-13 DIAGNOSIS — R791 Abnormal coagulation profile: Secondary | ICD-10-CM | POA: Insufficient documentation

## 2015-06-13 DIAGNOSIS — N179 Acute kidney failure, unspecified: Secondary | ICD-10-CM | POA: Diagnosis present

## 2015-06-13 DIAGNOSIS — J189 Pneumonia, unspecified organism: Secondary | ICD-10-CM | POA: Diagnosis present

## 2015-06-13 DIAGNOSIS — Z79899 Other long term (current) drug therapy: Secondary | ICD-10-CM | POA: Diagnosis not present

## 2015-06-13 DIAGNOSIS — E785 Hyperlipidemia, unspecified: Secondary | ICD-10-CM | POA: Diagnosis present

## 2015-06-13 DIAGNOSIS — J9621 Acute and chronic respiratory failure with hypoxia: Secondary | ICD-10-CM | POA: Diagnosis present

## 2015-06-13 DIAGNOSIS — E118 Type 2 diabetes mellitus with unspecified complications: Secondary | ICD-10-CM | POA: Diagnosis present

## 2015-06-13 DIAGNOSIS — E8729 Other acidosis: Secondary | ICD-10-CM | POA: Insufficient documentation

## 2015-06-13 DIAGNOSIS — J440 Chronic obstructive pulmonary disease with acute lower respiratory infection: Secondary | ICD-10-CM | POA: Diagnosis present

## 2015-06-13 DIAGNOSIS — A419 Sepsis, unspecified organism: Secondary | ICD-10-CM

## 2015-06-13 DIAGNOSIS — I2510 Atherosclerotic heart disease of native coronary artery without angina pectoris: Secondary | ICD-10-CM | POA: Diagnosis present

## 2015-06-13 DIAGNOSIS — Z923 Personal history of irradiation: Secondary | ICD-10-CM

## 2015-06-13 DIAGNOSIS — I130 Hypertensive heart and chronic kidney disease with heart failure and stage 1 through stage 4 chronic kidney disease, or unspecified chronic kidney disease: Secondary | ICD-10-CM | POA: Diagnosis present

## 2015-06-13 DIAGNOSIS — N182 Chronic kidney disease, stage 2 (mild): Secondary | ICD-10-CM | POA: Diagnosis present

## 2015-06-13 DIAGNOSIS — Z86711 Personal history of pulmonary embolism: Secondary | ICD-10-CM

## 2015-06-13 DIAGNOSIS — C3411 Malignant neoplasm of upper lobe, right bronchus or lung: Secondary | ICD-10-CM | POA: Diagnosis present

## 2015-06-13 DIAGNOSIS — J449 Chronic obstructive pulmonary disease, unspecified: Secondary | ICD-10-CM | POA: Diagnosis present

## 2015-06-13 DIAGNOSIS — N189 Chronic kidney disease, unspecified: Secondary | ICD-10-CM | POA: Diagnosis present

## 2015-06-13 DIAGNOSIS — E038 Other specified hypothyroidism: Secondary | ICD-10-CM

## 2015-06-13 DIAGNOSIS — E876 Hypokalemia: Secondary | ICD-10-CM | POA: Diagnosis present

## 2015-06-13 DIAGNOSIS — I4891 Unspecified atrial fibrillation: Secondary | ICD-10-CM | POA: Diagnosis not present

## 2015-06-13 DIAGNOSIS — J810 Acute pulmonary edema: Secondary | ICD-10-CM | POA: Diagnosis present

## 2015-06-13 HISTORY — DX: Chronic kidney disease, stage 2 (mild): N18.2

## 2015-06-13 HISTORY — DX: Chronic systolic (congestive) heart failure: I50.22

## 2015-06-13 LAB — URINALYSIS, ROUTINE W REFLEX MICROSCOPIC
Glucose, UA: NEGATIVE mg/dL
KETONES UR: 15 mg/dL — AB
NITRITE: POSITIVE — AB
PH: 5.5 (ref 5.0–8.0)
Protein, ur: >300 mg/dL — AB
SPECIFIC GRAVITY, URINE: 1.028 (ref 1.005–1.030)

## 2015-06-13 LAB — URINE MICROSCOPIC-ADD ON

## 2015-06-13 LAB — COMPREHENSIVE METABOLIC PANEL
ALBUMIN: 3 g/dL — AB (ref 3.5–5.0)
ALK PHOS: 111 U/L (ref 38–126)
ALT: 18 U/L (ref 17–63)
AST: 24 U/L (ref 15–41)
Anion gap: 15 (ref 5–15)
BUN: 29 mg/dL — ABNORMAL HIGH (ref 6–20)
CALCIUM: 8.4 mg/dL — AB (ref 8.9–10.3)
CO2: 20 mmol/L — AB (ref 22–32)
Chloride: 104 mmol/L (ref 101–111)
Creatinine, Ser: 1.56 mg/dL — ABNORMAL HIGH (ref 0.61–1.24)
GFR calc Af Amer: 46 mL/min — ABNORMAL LOW (ref >60)
GFR calc non Af Amer: 40 mL/min — ABNORMAL LOW (ref >60)
Glucose, Bld: 132 mg/dL — ABNORMAL HIGH (ref 65–99)
Potassium: 2.9 mmol/L — ABNORMAL LOW (ref 3.5–5.1)
SODIUM: 139 mmol/L (ref 135–145)
Total Bilirubin: 3.5 mg/dL — ABNORMAL HIGH (ref 0.3–1.2)
Total Protein: 6 g/dL — ABNORMAL LOW (ref 6.5–8.1)

## 2015-06-13 LAB — CBC WITH DIFFERENTIAL/PLATELET
BASOS ABS: 0 10*3/uL (ref 0.0–0.1)
BASOS PCT: 0 %
EOS ABS: 0 10*3/uL (ref 0.0–0.7)
Eosinophils Relative: 0 %
HEMATOCRIT: 45.7 % (ref 39.0–52.0)
HEMOGLOBIN: 15.6 g/dL (ref 13.0–17.0)
Lymphocytes Relative: 7 %
Lymphs Abs: 0.8 10*3/uL (ref 0.7–4.0)
MCH: 29.9 pg (ref 26.0–34.0)
MCHC: 34.1 g/dL (ref 30.0–36.0)
MCV: 87.7 fL (ref 78.0–100.0)
MONOS PCT: 8 %
Monocytes Absolute: 1 10*3/uL (ref 0.1–1.0)
NEUTROS ABS: 9.9 10*3/uL — AB (ref 1.7–7.7)
NEUTROS PCT: 85 %
Platelets: 248 10*3/uL (ref 150–400)
RBC: 5.21 MIL/uL (ref 4.22–5.81)
RDW: 14.9 % (ref 11.5–15.5)
WBC: 11.7 10*3/uL — AB (ref 4.0–10.5)

## 2015-06-13 LAB — I-STAT CG4 LACTIC ACID, ED
Lactic Acid, Venous: 2.47 mmol/L (ref 0.5–2.0)
Lactic Acid, Venous: 4.31 mmol/L (ref 0.5–2.0)

## 2015-06-13 MED ORDER — POTASSIUM CHLORIDE CRYS ER 20 MEQ PO TBCR
20.0000 meq | EXTENDED_RELEASE_TABLET | Freq: Once | ORAL | Status: AC
  Administered 2015-06-13: 20 meq via ORAL
  Filled 2015-06-13: qty 1

## 2015-06-13 MED ORDER — CEFTRIAXONE SODIUM 1 G IJ SOLR: 1.0000 g | INTRAMUSCULAR | Status: DC

## 2015-06-13 MED ORDER — ALBUTEROL SULFATE (2.5 MG/3ML) 0.083% IN NEBU: 5.0000 mg | INHALATION_SOLUTION | RESPIRATORY_TRACT | Status: DC | PRN

## 2015-06-13 MED ORDER — DEXTROSE 5 % IV SOLN
1.0000 g | INTRAVENOUS | Status: DC
  Administered 2015-06-14 – 2015-06-19 (×6): 1 g via INTRAVENOUS
  Filled 2015-06-13 (×7): qty 10

## 2015-06-13 MED ORDER — IPRATROPIUM BROMIDE 0.02 % IN SOLN
0.5000 mg | RESPIRATORY_TRACT | Status: DC
  Administered 2015-06-14: 0.5 mg via RESPIRATORY_TRACT
  Filled 2015-06-13: qty 2.5

## 2015-06-13 MED ORDER — DILTIAZEM HCL 100 MG IV SOLR
5.0000 mg/h | INTRAVENOUS | Status: DC
  Administered 2015-06-13: 7.5 mg/h via INTRAVENOUS
  Administered 2015-06-13: 5 mg/h via INTRAVENOUS
  Filled 2015-06-13 (×2): qty 100

## 2015-06-13 MED ORDER — DEXTROSE 5 % IV SOLN
500.0000 mg | Freq: Once | INTRAVENOUS | Status: AC
  Administered 2015-06-13: 500 mg via INTRAVENOUS
  Filled 2015-06-13: qty 500

## 2015-06-13 MED ORDER — IPRATROPIUM BROMIDE 0.02 % IN SOLN
0.5000 mg | Freq: Once | RESPIRATORY_TRACT | Status: AC
  Administered 2015-06-13: 0.5 mg via RESPIRATORY_TRACT
  Filled 2015-06-13: qty 2.5

## 2015-06-13 MED ORDER — DILTIAZEM LOAD VIA INFUSION
15.0000 mg | Freq: Once | INTRAVENOUS | Status: AC
  Administered 2015-06-13: 15 mg via INTRAVENOUS
  Filled 2015-06-13: qty 15

## 2015-06-13 MED ORDER — SODIUM CHLORIDE 0.9 % IV SOLN
INTRAVENOUS | Status: DC
  Administered 2015-06-13: 23:00:00 via INTRAVENOUS

## 2015-06-13 MED ORDER — SODIUM CHLORIDE 0.9 % IV BOLUS (SEPSIS)
1000.0000 mL | INTRAVENOUS | Status: AC
  Administered 2015-06-13 (×3): 1000 mL via INTRAVENOUS

## 2015-06-13 MED ORDER — OMEGA-3-ACID ETHYL ESTERS 1 G PO CAPS
1.0000 g | ORAL_CAPSULE | Freq: Every day | ORAL | Status: DC
  Administered 2015-06-14 – 2015-06-25 (×11): 1 g via ORAL
  Filled 2015-06-13 (×12): qty 1

## 2015-06-13 MED ORDER — ATORVASTATIN CALCIUM 20 MG PO TABS
20.0000 mg | ORAL_TABLET | Freq: Every morning | ORAL | Status: DC
  Administered 2015-06-14 – 2015-06-25 (×12): 20 mg via ORAL
  Filled 2015-06-13 (×12): qty 1

## 2015-06-13 MED ORDER — LEVOTHYROXINE SODIUM 112 MCG PO TABS
112.0000 ug | ORAL_TABLET | Freq: Every day | ORAL | Status: DC
  Administered 2015-06-14 – 2015-06-25 (×12): 112 ug via ORAL
  Filled 2015-06-13 (×12): qty 1

## 2015-06-13 MED ORDER — NITROGLYCERIN 0.4 MG SL SUBL: 0.4000 mg | SUBLINGUAL_TABLET | SUBLINGUAL | Status: DC | PRN

## 2015-06-13 MED ORDER — IBUPROFEN 200 MG PO TABS
600.0000 mg | ORAL_TABLET | Freq: Once | ORAL | Status: AC
  Administered 2015-06-13: 600 mg via ORAL
  Filled 2015-06-13: qty 3

## 2015-06-13 MED ORDER — POTASSIUM CHLORIDE CRYS ER 20 MEQ PO TBCR
40.0000 meq | EXTENDED_RELEASE_TABLET | Freq: Once | ORAL | Status: AC
  Administered 2015-06-13: 40 meq via ORAL
  Filled 2015-06-13: qty 2

## 2015-06-13 MED ORDER — DEXTROSE 5 % IV SOLN
1.0000 g | Freq: Once | INTRAVENOUS | Status: AC
  Administered 2015-06-13: 1 g via INTRAVENOUS
  Filled 2015-06-13: qty 10

## 2015-06-13 MED ORDER — INSULIN ASPART 100 UNIT/ML ~~LOC~~ SOLN
0.0000 [IU] | Freq: Every day | SUBCUTANEOUS | Status: DC
  Administered 2015-06-15: 4 [IU] via SUBCUTANEOUS

## 2015-06-13 MED ORDER — INSULIN ASPART 100 UNIT/ML ~~LOC~~ SOLN
0.0000 [IU] | Freq: Three times a day (TID) | SUBCUTANEOUS | Status: DC
  Administered 2015-06-14 – 2015-06-15 (×2): 2 [IU] via SUBCUTANEOUS
  Administered 2015-06-15 – 2015-06-16 (×3): 3 [IU] via SUBCUTANEOUS
  Administered 2015-06-16: 5 [IU] via SUBCUTANEOUS

## 2015-06-13 MED ORDER — LEVALBUTEROL HCL 1.25 MG/0.5ML IN NEBU
1.2500 mg | INHALATION_SOLUTION | Freq: Four times a day (QID) | RESPIRATORY_TRACT | Status: DC
  Administered 2015-06-14: 1.25 mg via RESPIRATORY_TRACT
  Filled 2015-06-13: qty 0.5

## 2015-06-13 MED ORDER — DEXTROSE 5 % IV SOLN: 500.0000 mg | INTRAVENOUS | Status: DC

## 2015-06-13 MED ORDER — DM-GUAIFENESIN ER 30-600 MG PO TB12
1.0000 | ORAL_TABLET | Freq: Two times a day (BID) | ORAL | Status: DC
  Administered 2015-06-13 – 2015-06-25 (×24): 1 via ORAL
  Filled 2015-06-13 (×24): qty 1

## 2015-06-13 MED ORDER — PANTOPRAZOLE SODIUM 40 MG PO TBEC
40.0000 mg | DELAYED_RELEASE_TABLET | Freq: Every day | ORAL | Status: DC
  Administered 2015-06-14 – 2015-06-25 (×12): 40 mg via ORAL
  Filled 2015-06-13 (×12): qty 1

## 2015-06-13 MED ORDER — ALBUTEROL SULFATE (2.5 MG/3ML) 0.083% IN NEBU
5.0000 mg | INHALATION_SOLUTION | Freq: Once | RESPIRATORY_TRACT | Status: AC
  Administered 2015-06-13: 5 mg via RESPIRATORY_TRACT
  Filled 2015-06-13: qty 6

## 2015-06-13 MED ORDER — DILTIAZEM HCL ER COATED BEADS 240 MG PO CP24
240.0000 mg | ORAL_CAPSULE | Freq: Every day | ORAL | Status: DC
  Administered 2015-06-14: 240 mg via ORAL
  Filled 2015-06-13: qty 1

## 2015-06-13 MED ORDER — AZITHROMYCIN 500 MG IV SOLR
250.0000 mg | INTRAVENOUS | Status: DC
  Administered 2015-06-14 – 2015-06-15 (×2): 250 mg via INTRAVENOUS
  Filled 2015-06-13 (×3): qty 250

## 2015-06-13 NOTE — ED Notes (Signed)
Pt took Tylenol 325 mg x 2 @ 1 hour PTA.

## 2015-06-13 NOTE — Progress Notes (Signed)
ANTIBIOTIC CONSULT NOTE - INITIAL  Pharmacy Consult for Ceftriaxone and Azithromycin Indication: CAP  No Known Allergies  Patient Measurements: Height: 6' 2.02" (188 cm) (from 10/29/14) Weight: 197 lb 15.6 oz (89.8 kg) (from 10/29/14) IBW/kg (Calculated) : 82.24   Vital Signs: Temp: 102.4 F (39.1 C) (03/26 1858) Temp Source: Rectal (03/26 1858) BP: 105/77 mmHg (03/26 1858) Pulse Rate: 138 (03/26 1858) Intake/Output from previous day:   Intake/Output from this shift:    Labs: No results for input(s): WBC, HGB, PLT, LABCREA, CREATININE in the last 72 hours. CrCl cannot be calculated (Patient has no serum creatinine result on file.). No results for input(s): VANCOTROUGH, VANCOPEAK, VANCORANDOM, GENTTROUGH, GENTPEAK, GENTRANDOM, TOBRATROUGH, TOBRAPEAK, TOBRARND, AMIKACINPEAK, AMIKACINTROU, AMIKACIN in the last 72 hours.   Microbiology: No results found for this or any previous visit (from the past 720 hour(s)).  Medical History: Past Medical History  Diagnosis Date  . Diabetes mellitus   . Thyroid disease   . Coronary artery disease   . COPD (chronic obstructive pulmonary disease) ([place])   . GERD (gastroesophageal reflux disease)   . Pulmonary embolism ([place])   . Hyperlipidemia   . FH: colonic polyps   . PAF (paroxysmal atrial fibrillation) ([place])   . Hypertension   . ASCVD (arteriosclerotic cardiovascular disease)   . Cancer ([facility])     Medications:  Scheduled:  . albuterol  5 mg Nebulization Once  . ipratropium  0.5 mg Nebulization Once   Assessment: 82 y.o male with onset 2-3 days productive cough, fever, and increase in shortness of breath. Pharmacy consulted to dose empiric antibiotics for CAP. Admission labs pending.  No renal adjustment necessary for azithromycin and ceftriaxone. 1st doses already ordered x1 each in ED, not given yet.  Goal of Therapy:  Resolution of infection  Plan:  Ceftriaxone 1gm IV q24h  Azithromycin '500mg'$  IV x1 then 250 mg IV  q24h Monitor renal function, cultures, clinical progress daily.   Nicole Cella, RPh Clinical Pharmacist Pager: [phone] 06/13/2015,7:05 PM

## 2015-06-13 NOTE — ED Notes (Signed)
Onset 2-3 days productive cough, fever, and increase in shortness of breath.  Wife reports pt is confused at times.  Pt having labored breathing.

## 2015-06-13 NOTE — H&P (Addendum)
[facility] History and Physical  Justin Garner JOA:[id number] DOB: [birth year] DOA: 06/13/2015  Referring physician: ED physician PCP: Kandice Hams, MD  Specialists:   Chief Complaint: Productive cough, fever and shortness of breath  HPI: Justin Garner is a 82 y.o. male with PMH of hypertension, hyperlipidemia, COPD, GERD, hypothyroidism, CAD, pulmonary embolism, PAF on Coumadin, systolic congestive heart failure with EF 45%, chronic kidney disease-stage II, history of right upper lobe lung cancer (S/P surgery, radiation therapy and chemotherapy, followed up with oncologist for 5 years, currently not seeing oncologist), who presents with productive cough, fever and shortness of breath.  Patient reports that he has been having cough, fever, shortness of breath in the past several days, which has been progressively getting worse. He coughs up yellow colored sputum. No chest pain. Patient does not have nausea, vomiting, abdominal pain or unilateral weakness. He has positive urinalysis for UTI, but denies any symptoms.  In ED, patient was found to have WBC 11.7, lactate of 2.47, temperature 102.4, tachycardia, tachypnea, positive urinalysis for UTI, potassium 2.9, worsening renal function. Oxygen desaturation to 89% on room air.  Chest x-ray showed chronic changes similar to that noted on the prior exam, no acute infiltrate is seen. Patients and admitted to inpatient for further evaluation and treatment.   EKG: Independently reviewed. QTC 426, atrial fibrillation with RVR, occasional PVC, RAD  Where does patient live?   At home  Can patient participate in ADLs?  Some   Review of Systems:   General: has fevers, chills, no changes in body weight, has poor appetite, has fatigue HEENT: no blurry vision, hearing changes or sore throat Pulm: has dyspnea, coughing, no wheezing CV: no chest pain, no palpitations Abd: no nausea, vomiting, abdominal pain, diarrhea, constipation GU: no  dysuria, burning on urination, increased urinary frequency, hematuria  Ext: no leg edema Neuro: no unilateral weakness, numbness, or tingling, no vision change or hearing loss Skin: no rash MSK: No muscle spasm, no deformity, no limitation of range of movement in spin Heme: No easy bruising.  Travel history: No recent long distant travel.  Allergy: No Known Allergies  Past Medical History  Diagnosis Date  . Diabetes mellitus   . Thyroid disease   . Coronary artery disease   . COPD (chronic obstructive pulmonary disease) ([place])   . GERD (gastroesophageal reflux disease)   . Pulmonary embolism ([place])   . Hyperlipidemia   . FH: colonic polyps   . PAF (paroxysmal atrial fibrillation) ([place])   . Hypertension   . ASCVD (arteriosclerotic cardiovascular disease)   . Cancer ([place])   . Chronic systolic CHF (congestive heart failure) ([place])   . CKD (chronic kidney disease), stage II     Past Surgical History  Procedure Laterality Date  . Cholecystectomy    . Hernia repair    . Carotid stent    . Stent proximal    . Patent stents    . Inguinal hernia repair  10/10/2010  . Fiberoptic bronchoscopy and meiastinoscopy  06/07/2007  . R vats,thoracotomy and upper lobectomy  08/02/2007  . Left heart catheterization with coronary angiogram N/A 08/19/2013    Procedure: LEFT HEART CATHETERIZATION WITH CORONARY ANGIOGRAM;  Surgeon: Jettie Booze, MD;  Location: [facility] CATH LAB;  Service: Cardiovascular;  Laterality: N/A;    Social History:  reports that he quit smoking about 14 years ago. His smoking use included Cigarettes. He has a 50 pack-year smoking history. He has quit using smokeless tobacco. He reports that he  does not drink alcohol or use illicit drugs.  Family History:  Family History  Problem Relation Age of Onset  . Heart disease Father      Prior to Admission medications   Medication Sig Start Date End Date Taking? Authorizing Provider  albuterol (PROVENTIL HFA;VENTOLIN HFA) 108  (90 BASE) MCG/ACT inhaler Inhale 2 puffs into the lungs every 6 (six) hours as needed for wheezing or shortness of breath. 10/29/14  Yes Collene Gobble, MD  atorvastatin (LIPITOR) 20 MG tablet TAKE 1 BY MOUTH DAILY Patient taking differently: Take 20 mg by mouth every morning. TAKE 1 BY MOUTH DAILY 04/26/15  Yes Jettie Booze, MD  diltiazem (CARDIZEM CD) 240 MG 24 hr capsule Take 1 capsule (240 mg total) by mouth daily. 07/29/14  Yes Jettie Booze, MD  fish oil-omega-3 fatty acids 1000 MG capsule Take 1 g by mouth 2 (two) times daily.    Yes Historical Provider, MD  furosemide (LASIX) 20 MG tablet Take 20 mg by mouth daily.  08/01/13  Yes Historical Provider, MD  glipiZIDE (GLUCOTROL XL) 10 MG 24 hr tablet Take 10 mg by mouth daily with breakfast.    Yes Historical Provider, MD  guaifenesin (ROBITUSSIN) 100 MG/5ML syrup Take 200 mg by mouth 3 (three) times daily as needed for cough.   Yes Historical Provider, MD  JANUVIA 50 MG tablet Take 50 mg by mouth every morning.  07/21/11  Yes Historical Provider, MD  levothyroxine (SYNTHROID, LEVOTHROID) 112 MCG tablet Take 112 mcg by mouth daily.     Yes Historical Provider, MD  nitroGLYCERIN (NITROSTAT) 0.4 MG SL tablet Place 0.4 mg under the tongue every 5 (five) minutes as needed for chest pain.   Yes Historical Provider, MD  omeprazole (PRILOSEC) 20 MG capsule Take 20 mg by mouth daily.     Yes Historical Provider, MD  warfarin (COUMADIN) 5 MG tablet Take as directed by [facility] Clinic Patient taking differently: Take 5-7.5 mg by mouth every morning. Takes '5mg'$  on Thurs on ly Takes 7.'5mg'$  all other days 04/26/15  Yes Jettie Booze, MD    Physical Exam: Filed Vitals:   06/13/15 2200 06/13/15 2215 06/13/15 2230 06/13/15 2245  BP: 110/62 110/75 112/77 125/77  Pulse: 93 106 57 34  Temp:      TempSrc:      Resp: '27 31 20 30  '$ Height:      Weight:      SpO2: 96% 98% 98% 93%   General: In acute distress HEENT:       Eyes: PERRL, EOMI, no  scleral icterus.       ENT: No discharge from the ears and nose, no pharynx injection, no tonsillar enlargement.        Neck: No JVD, no bruit, no mass felt. Heme: No neck lymph node enlargement. Cardiac: S1/S2, irregularly irregular rhythm, tachycardia, No murmurs, No gallops or rubs. Pulm: has diffuse rales bilaterally. Has has mild wheezing bilaterally. Abd: Soft, nondistended, nontender, no rebound pain, no organomegaly, BS present. Ext: No pitting leg edema bilaterally. 2+DP/PT pulse bilaterally. Musculoskeletal: No joint deformities, No joint redness or warmth, no limitation of ROM in spin. Skin: No rashes.  Neuro: Alert, oriented X3, cranial nerves II-XII grossly intact, moves all extremities normally. Psych: Patient is not psychotic, no suicidal or hemocidal ideation.  Labs on Admission:  Basic Metabolic Panel:  Recent Labs Lab 06/13/15 1940  NA 139  K 2.9*  CL 104  CO2 20*  GLUCOSE 132*  BUN  29*  CREATININE 1.56*  CALCIUM 8.4*   Liver Function Tests:  Recent Labs Lab 06/13/15 1940  AST 24  ALT 18  ALKPHOS 111  BILITOT 3.5*  PROT 6.0*  ALBUMIN 3.0*   No results for input(s): LIPASE, AMYLASE in the last 168 hours. No results for input(s): AMMONIA in the last 168 hours. CBC:  Recent Labs Lab 06/13/15 1940  WBC 11.7*  NEUTROABS 9.9*  HGB 15.6  HCT 45.7  MCV 87.7  PLT 248   Cardiac Enzymes: No results for input(s): CKTOTAL, CKMB, CKMBINDEX, TROPONINI in the last 168 hours.  BNP (last 3 results) No results for input(s): BNP in the last 8760 hours.  ProBNP (last 3 results) No results for input(s): PROBNP in the last 8760 hours.  CBG: No results for input(s): GLUCAP in the last 168 hours.  Radiological Exams on Admission: Dg Chest 2 View  06/13/2015  CLINICAL DATA:  Cough and fever for 3 days, initial encounter EXAM: CHEST  2 VIEW COMPARISON:  08/21/2014 FINDINGS: Cardiac shadow is stable. The lungs are well aerated bilaterally and demonstrate some  chronic changes similar to that seen on the prior exam. No focal infiltrate or sizable effusion is noted. No acute bony abnormality is seen. IMPRESSION: Chronic changes similar to that noted on the prior exam. No acute infiltrate is seen. Electronically Signed   By: Inez Catalina M.D.   On: 06/13/2015 19:32    Assessment/Plan Principal Problem:   Acute on chronic respiratory failure with hypoxia (HCC) Active Problems:   COPD (chronic obstructive pulmonary disease) with emphysema (HCC)   Atrial fibrillation with RVR (HCC)   Coronary atherosclerosis of native coronary artery   Essential hypertension, benign   Lung cancer, upper lobe (HCC)   Hypokalemia   Sepsis ([place])   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 ([place])   UTI (lower urinary tract infection)   CAP (community acquired pneumonia)   Chronic systolic CHF (congestive heart failure) (HCC)   Acute on chronic kidney failure-II   Hypothyroidism   HLD (hyperlipidemia)   Acute on chronic respiratory failure with hypoxia due to possible CAP and sepsis: Chest x-ray did not show infiltration, but clinically patient may have CAP given diffuse rales bilaterally on lung auscultation, productive cough, fever and shortness of breath. Patient may also have COPD exacerbation. Patient has history of systolic congestive heart failure with EF45%, but patient's volume status is okay on admission. Patient is septic with elevated lactate 2.47, fever, tachycardia, tachypnea and leukocytosis. Hemodynamically stable currently.  - will admit SDU since pt need Cardizem gtt for A fib with RVR - IV Rocephin and azithromycin - Mucinex for cough  - Atrovent, Xopenex Neb prn for SOB - Urine legionella and S. pneumococcal antigen - Follow up blood culture x2, sputum culture and respiratory virus panel, plus Flu pcr - will get Procalcitonin and trend lactic acid level per sepsis protocol - IVF: 3.5L of NS bolus in ED, followed by 100 mL per hour of NS  - stat ABG  COPD  (chronic obstructive pulmonary disease) with emphysema ([place]): Patient has mild wheezing bilaterally, indicating possible exacerbation. -see above for breathing treatment and Abx  Atrial fibrillation with RVR ([employer]): CHA2DS2-VASc Score is 5, needs oral anticoagulation. Patient is on Coumadin at home. INR is pending on admission. Heart rate is up to 144 on admission. -start IV cardizem gtt -continue oral cardizem, but start in AM -trop x 3 -Coumadin per pharmacy  Hx of PE: -On coumadin  Coronary atherosclerosis  of native coronary artery: No CP. -continue Lipitor, when necessary nitroglycerin  HTN: -Hold lasix due to sepsis and worsening renal function -Patient is on IV Cardizem drip for A. fib with RVR  Hypokalemia: K= 2.9 on admission. - Repleted with 60 mEq of KCl - Check Mg level  DM-II: Last A1c 6.4 on 12/29/14, well controled. Patient is taking glipizide and Januvia at home -SSI  Positive urinalysis: Patient denies symptoms of UTI. -On IV antibiotics for possible CAP -f/u urine culture  Chronic systolic CHF (congestive heart failure) ([place]): 2-D echo on 07/18/13 showed EF 45%. Patient does not have leg edema or JVD. CHF is compensated. -Hold Lasix due to sepsis and worsening renal function -Check BNP  AKI: Likely due to prerenal secondary to dehydration and continuation of ACEI, ARB, diruetics, NSAIDs - IVF as above - Check FeUrea - Follow up renal function by BMP - Hold Diuretics  Hypothyroidism: Last TSH was 5.913 -Continue home Synthroid -Check TSH  HLD: Last LDL was not on record -Continue home medications: Lipitor -Check FLP   DVT ppx: On Coumadin  Code Status: Full code Family Communication: Yes, patient's wife at bed side Disposition Plan: Admit to inpatient   Date of Service 06/13/2015    Ivor Costa [facility] Pager [phone]  If 7PM-7AM, please contact night-coverage [email or web address] Password [facility] 06/13/2015, 10:58 PM

## 2015-06-13 NOTE — ED Provider Notes (Signed)
CSN: [id number]     Arrival date & time 06/13/15  1821 History   First MD Initiated Contact with Patient 06/13/15 1838     Chief Complaint  Patient presents with  . Fever  . Cough  . Shortness of Breath     (Consider location/radiation/quality/duration/timing/severity/associated sxs/prior Treatment) The history is provided by the patient and medical records. No language interpreter was used.    Justin Garner is a 82 y.o. male  with a hx of NIDDM, CAD, COPD, PE, chronic a-fib (on coumadin), HTN  presents to the Emergency Department complaining of gradual, persistent, progressively worsening fever, SOB, generalized weakness onset 4 days ago.  Pt's wife gives much of the hx due to severe SOB.  She reports URI symptoms beginning 4 days ago with development of a low grade fever at that time.  Today he appeared weaker and more SOB that usual.  Hid temp at home was 101.6.  He was given tylenol PTA.  Unknown influenza vaccine.  Associated symptoms include cough, rhinorrhea.  Nothing makes it better and nothing makes it worse.  Pt denies headache, neck pain, neck stiffness, chest pain, abd pain, N/V/D, syncope, dysuria.       Past Medical History  Diagnosis Date  . Diabetes mellitus   . Thyroid disease   . Coronary artery disease   . COPD (chronic obstructive pulmonary disease) ([place])   . GERD (gastroesophageal reflux disease)   . Pulmonary embolism ([place])   . Hyperlipidemia   . FH: colonic polyps   . PAF (paroxysmal atrial fibrillation) ([place])   . Hypertension   . ASCVD (arteriosclerotic cardiovascular disease)   . Cancer ([place])   . Chronic systolic CHF (congestive heart failure) ([place])   . CKD (chronic kidney disease), stage II    Past Surgical History  Procedure Laterality Date  . Cholecystectomy    . Hernia repair    . Carotid stent    . Stent proximal    . Patent stents    . Inguinal hernia repair  10/10/2010  . Fiberoptic bronchoscopy and meiastinoscopy  06/07/2007  . R  vats,thoracotomy and upper lobectomy  08/02/2007  . Left heart catheterization with coronary angiogram N/A 08/19/2013    Procedure: LEFT HEART CATHETERIZATION WITH CORONARY ANGIOGRAM;  Surgeon: Jettie Booze, MD;  Location: [facility] CATH LAB;  Service: Cardiovascular;  Laterality: N/A;   Family History  Problem Relation Age of Onset  . Heart disease Father    Social History  Substance Use Topics  . Smoking status: Former Smoker -- 1.00 packs/day for 50 years    Types: Cigarettes    Quit date: 03/20/2001  . Smokeless tobacco: Former Systems developer  . Alcohol Use: No    Review of Systems  Constitutional: Positive for fever. Negative for diaphoresis, appetite change, fatigue and unexpected weight change.  HENT: Positive for congestion, rhinorrhea and sinus pressure. Negative for mouth sores.   Eyes: Negative for visual disturbance.  Respiratory: Positive for cough, shortness of breath and wheezing. Negative for chest tightness.   Cardiovascular: Negative for chest pain.  Gastrointestinal: Negative for nausea, vomiting, abdominal pain, diarrhea and constipation.  Endocrine: Negative for polydipsia, polyphagia and polyuria.  Genitourinary: Negative for dysuria, urgency, frequency and hematuria.  Musculoskeletal: Negative for back pain and neck stiffness.  Skin: Negative for rash.  Allergic/Immunologic: Negative for immunocompromised state.  Neurological: Negative for syncope, light-headedness and headaches.  Hematological: Does not bruise/bleed easily.  Psychiatric/Behavioral: Negative for sleep disturbance. The patient is not nervous/anxious.  Allergies  Review of patient's allergies indicates no known allergies.  Home Medications   Prior to Admission medications   Medication Sig Start Date End Date Taking? Authorizing Provider  albuterol (PROVENTIL HFA;VENTOLIN HFA) 108 (90 BASE) MCG/ACT inhaler Inhale 2 puffs into the lungs every 6 (six) hours as needed for wheezing or shortness of  breath. 10/29/14  Yes Collene Gobble, MD  atorvastatin (LIPITOR) 20 MG tablet TAKE 1 BY MOUTH DAILY Patient taking differently: Take 20 mg by mouth every morning. TAKE 1 BY MOUTH DAILY 04/26/15  Yes Jettie Booze, MD  diltiazem (CARDIZEM CD) 240 MG 24 hr capsule Take 1 capsule (240 mg total) by mouth daily. 07/29/14  Yes Jettie Booze, MD  fish oil-omega-3 fatty acids 1000 MG capsule Take 1 g by mouth 2 (two) times daily.    Yes Historical Provider, MD  furosemide (LASIX) 20 MG tablet Take 20 mg by mouth daily.  08/01/13  Yes Historical Provider, MD  glipiZIDE (GLUCOTROL XL) 10 MG 24 hr tablet Take 10 mg by mouth daily with breakfast.    Yes Historical Provider, MD  guaifenesin (ROBITUSSIN) 100 MG/5ML syrup Take 200 mg by mouth 3 (three) times daily as needed for cough.   Yes Historical Provider, MD  JANUVIA 50 MG tablet Take 50 mg by mouth every morning.  07/21/11  Yes Historical Provider, MD  levothyroxine (SYNTHROID, LEVOTHROID) 112 MCG tablet Take 112 mcg by mouth daily.     Yes Historical Provider, MD  nitroGLYCERIN (NITROSTAT) 0.4 MG SL tablet Place 0.4 mg under the tongue every 5 (five) minutes as needed for chest pain.   Yes Historical Provider, MD  omeprazole (PRILOSEC) 20 MG capsule Take 20 mg by mouth daily.     Yes Historical Provider, MD  warfarin (COUMADIN) 5 MG tablet Take as directed by [facility] Patient taking differently: Take 5-7.5 mg by mouth every morning. Takes '5mg'$  on Thurs on ly Takes 7.'5mg'$  all other days 04/26/15  Yes Jettie Booze, MD   BP 110/75 mmHg  Pulse 106  Temp(Src) 98.1 F (36.7 C) (Oral)  Resp 31  Ht 6' 2.02" (1.88 m)  Wt 89.8 kg  BMI 25.41 kg/m2  SpO2 98% Physical Exam  Constitutional: He appears well-developed and well-nourished. No distress.  Awake, alert, nontoxic appearance  HENT:  Head: Normocephalic and atraumatic.  Mouth/Throat: Oropharynx is clear and moist. No oropharyngeal exudate.  Eyes: Conjunctivae are normal. No scleral  icterus.  Neck: Normal range of motion. Neck supple.  Cardiovascular: Intact distal pulses.  An irregularly irregular rhythm present. Tachycardia present.   Pulses:      Radial pulses are 2+ on the right side, and 2+ on the left side.       Dorsalis pedis pulses are 2+ on the right side, and 2+ on the left side.  Pulmonary/Chest: Accessory muscle usage present. Tachypnea noted. He is in respiratory distress. He has decreased breath sounds. He has wheezes. He has rhonchi.  Equal chest expansion  Abdominal: Soft. Bowel sounds are normal. He exhibits no mass. There is no tenderness. There is no rebound and no guarding.  Musculoskeletal: Normal range of motion. He exhibits no edema.  Neurological: He is alert.  Speech is clear and goal oriented Moves extremities without ataxia  Skin: Skin is warm and dry. He is not diaphoretic.  Psychiatric: He has a normal mood and affect.  Nursing note and vitals reviewed.   ED Course  Procedures (including critical care time) Labs Review Labs  Reviewed  COMPREHENSIVE METABOLIC PANEL - Abnormal; Notable for the following:    Potassium 2.9 (*)    CO2 20 (*)    Glucose, Bld 132 (*)    BUN 29 (*)    Creatinine, Ser 1.56 (*)    Calcium 8.4 (*)    Total Protein 6.0 (*)    Albumin 3.0 (*)    Total Bilirubin 3.5 (*)    GFR calc non Af Amer 40 (*)    GFR calc Af Amer 46 (*)    All other components within normal limits  URINALYSIS, ROUTINE W REFLEX MICROSCOPIC (NOT AT [facility]) - Abnormal; Notable for the following:    Color, Urine RED (*)    APPearance CLOUDY (*)    Hgb urine dipstick LARGE (*)    Bilirubin Urine MODERATE (*)    Ketones, ur 15 (*)    Protein, ur >300 (*)    Nitrite POSITIVE (*)    Leukocytes, UA SMALL (*)    All other components within normal limits  CBC WITH DIFFERENTIAL/PLATELET - Abnormal; Notable for the following:    WBC 11.7 (*)    Neutro Abs 9.9 (*)    All other components within normal limits  URINE MICROSCOPIC-ADD ON -  Abnormal; Notable for the following:    Squamous Epithelial / LPF 0-5 (*)    Bacteria, UA RARE (*)    All other components within normal limits  PROTIME-INR - Abnormal; Notable for the following:    Prothrombin Time 28.3 (*)    INR 2.70 (*)    All other components within normal limits  I-STAT CG4 LACTIC ACID, ED - Abnormal; Notable for the following:    Lactic Acid, Venous 2.47 (*)    All other components within normal limits  I-STAT CG4 LACTIC ACID, ED - Abnormal; Notable for the following:    Lactic Acid, Venous 4.31 (*)    All other components within normal limits  CULTURE, BLOOD (ROUTINE X 2)  CULTURE, BLOOD (ROUTINE X 2)  URINE CULTURE  RESPIRATORY VIRUS PANEL  CULTURE, EXPECTORATED SPUTUM-ASSESSMENT  GRAM STAIN  MRSA PCR SCREENING  TROPONIN I  LACTIC ACID, PLASMA  APTT  INFLUENZA PANEL BY PCR (TYPE A & B, H1N1)  TROPONIN I  TROPONIN I  LACTIC ACID, PLASMA  PROCALCITONIN  CREATININE, URINE, RANDOM  UREA NITROGEN, URINE  BRAIN NATRIURETIC PEPTIDE  STREP PNEUMONIAE URINARY ANTIGEN  LEGIONELLA PNEUMOPHILA SEROGP 1 UR AG  LIPID PANEL  MAGNESIUM  GLUCOSE, CAPILLARY  CBG MONITORING, ED    Imaging Review Dg Chest 2 View  06/13/2015  CLINICAL DATA:  Cough and fever for 3 days, initial encounter EXAM: CHEST  2 VIEW COMPARISON:  08/21/2014 FINDINGS: Cardiac shadow is stable. The lungs are well aerated bilaterally and demonstrate some chronic changes similar to that seen on the prior exam. No focal infiltrate or sizable effusion is noted. No acute bony abnormality is seen. IMPRESSION: Chronic changes similar to that noted on the prior exam. No acute infiltrate is seen. Electronically Signed   By: Inez Catalina M.D.   On: 06/13/2015 19:32   I have personally reviewed and evaluated these images and lab results as part of my medical decision-making.   EKG Interpretation   Date/Time:  Sunday June 13 2015 18:28:31 EDT Ventricular Rate:  154 PR Interval:    QRS Duration:  94 QT Interval:  338 QTC Calculation: 541 R Axis:   174 Text Interpretation:  Atrial fibrillation with rapid ventricular response  with premature ventricular or  aberrantly conducted complexes Right axis  deviation Incomplete right bundle branch block Cannot rule out Anterior  infarct , age undetermined ST' \\T'$ \ T wave abnormality, consider inferior  ischemia Abnormal ECG Confirmed by Jeneen Rinks  MD, [place] ([id number]) on 06/14/2015  12:45:05 AM       CRITICAL CARE Performed by: Abigail Butts Total critical care time: 45 minutes Critical care time was exclusive of separately billable procedures and treating other patients. Critical care was necessary to treat or prevent imminent or life-threatening deterioration. Critical care was time spent personally by me on the following activities: development of treatment plan with patient and/or surrogate as well as nursing, discussions with consultants, evaluation of patient's response to treatment, examination of patient, obtaining history from patient or surrogate, ordering and performing treatments and interventions, ordering and review of laboratory studies, ordering and review of radiographic studies, pulse oximetry and re-evaluation of patient's condition.   MDM   Final diagnoses:  Sepsis, due to unspecified organism ([place])  Cough  Fever, unspecified fever cause  Chronic a-fib (HCC)  Hypokalemia  Other emphysema ([place])    Justin Garner presents with sepsis. Patient with history of A. fib now with RVR. Question if this is secondary to dehydration, fever. Patient given fluids and Tylenol without significant resolution. A Cardizem drip begun.  Lactic acid elevated at 2.47 initially and repeat of 4.31. INR is therapeutic at 2.70. Hypokalemia repletion begun in the emergency department. Elevated serum creatinine 1.56.  UA with nitrates and leukocytes however no white blood cells and rare bacteria. Culture pending. Chest x-ray read as no  infiltrate however right costophrenic angle is blunted and patient is producing purulent sputum. Suspicious that he has pneumonia as a source of his sepsis.  She given 30 mL/kg bolus and antibiotics. Admitted.  The patient was discussed with and seen by Dr. Jeneen Rinks who agrees with the treatment plan.   Justin Soho Jaasiel [place], PA-C 06/14/15 5027  Tanna Furry, MD 06/16/15 [phone]

## 2015-06-14 ENCOUNTER — Encounter (HOSPITAL_COMMUNITY): Payer: Self-pay | Admitting: *Deleted

## 2015-06-14 LAB — BASIC METABOLIC PANEL
Anion gap: 10 (ref 5–15)
BUN: 26 mg/dL — AB (ref 6–20)
CO2: 21 mmol/L — ABNORMAL LOW (ref 22–32)
CREATININE: 1.06 mg/dL (ref 0.61–1.24)
Calcium: 8.1 mg/dL — ABNORMAL LOW (ref 8.9–10.3)
Chloride: 107 mmol/L (ref 101–111)
GFR calc Af Amer: >60 mL/min (ref >60)
Glucose, Bld: 178 mg/dL — ABNORMAL HIGH (ref 65–99)
POTASSIUM: 3.6 mmol/L (ref 3.5–5.1)
SODIUM: 138 mmol/L (ref 135–145)

## 2015-06-14 LAB — LACTIC ACID, PLASMA
LACTIC ACID, VENOUS: 2.1 mmol/L — AB (ref 0.5–2.0)
Lactic Acid, Venous: 2 mmol/L (ref 0.5–2.0)

## 2015-06-14 LAB — LIPID PANEL
Cholesterol: 66 mg/dL (ref 0–200)
HDL: 19 mg/dL — ABNORMAL LOW (ref >40)
LDL CALC: 31 mg/dL (ref 0–99)
Total CHOL/HDL Ratio: 3.5 RATIO
Triglycerides: 79 mg/dL (ref <150)
VLDL: 16 mg/dL (ref 0–40)

## 2015-06-14 LAB — MAGNESIUM
MAGNESIUM: 1.1 mg/dL — AB (ref 1.7–2.4)
Magnesium: 1.7 mg/dL (ref 1.7–2.4)

## 2015-06-14 LAB — EXPECTORATED SPUTUM ASSESSMENT W REFEX TO RESP CULTURE

## 2015-06-14 LAB — GLUCOSE, CAPILLARY
GLUCOSE-CAPILLARY: 170 mg/dL — AB (ref 65–99)
Glucose-Capillary: 135 mg/dL — ABNORMAL HIGH (ref 65–99)
Glucose-Capillary: 114 mg/dL — ABNORMAL HIGH (ref 65–99)
Glucose-Capillary: 138 mg/dL — ABNORMAL HIGH (ref 65–99)
Glucose-Capillary: 161 mg/dL — ABNORMAL HIGH (ref 65–99)

## 2015-06-14 LAB — TROPONIN I
TROPONIN I: 0.04 ng/mL — AB (ref <0.031)
TROPONIN I: 0.03 ng/mL (ref <0.031)
Troponin I: 0.03 ng/mL (ref <0.031)

## 2015-06-14 LAB — CREATININE, URINE, RANDOM: CREATININE, URINE: 257.47 mg/dL

## 2015-06-14 LAB — URINE CULTURE: Culture: NO GROWTH

## 2015-06-14 LAB — PROCALCITONIN: PROCALCITONIN: 0.93 ng/mL

## 2015-06-14 LAB — PROTIME-INR
INR: 2.7 — ABNORMAL HIGH (ref 0.00–1.49)
Prothrombin Time: 28.3 seconds — ABNORMAL HIGH (ref 11.6–15.2)

## 2015-06-14 LAB — APTT: aPTT: 37 seconds (ref 24–37)

## 2015-06-14 LAB — MRSA PCR SCREENING: MRSA by PCR: NEGATIVE

## 2015-06-14 LAB — INFLUENZA PANEL BY PCR (TYPE A & B)
H1N1 flu by pcr: NOT DETECTED
INFLBPCR: NEGATIVE
Influenza A By PCR: NEGATIVE

## 2015-06-14 LAB — BRAIN NATRIURETIC PEPTIDE: B Natriuretic Peptide: 189 pg/mL — ABNORMAL HIGH (ref 0.0–100.0)

## 2015-06-14 MED ORDER — CETYLPYRIDINIUM CHLORIDE 0.05 % MT LIQD
7.0000 mL | Freq: Two times a day (BID) | OROMUCOSAL | Status: DC
  Administered 2015-06-14 – 2015-06-25 (×19): 7 mL via OROMUCOSAL

## 2015-06-14 MED ORDER — WARFARIN SODIUM 5 MG PO TABS
7.5000 mg | ORAL_TABLET | ORAL | Status: AC
  Administered 2015-06-14: 7.5 mg via ORAL
  Filled 2015-06-14: qty 1.5

## 2015-06-14 MED ORDER — SODIUM CHLORIDE 0.9 % IV BOLUS (SEPSIS)
500.0000 mL | Freq: Once | INTRAVENOUS | Status: AC
  Administered 2015-06-14: 500 mL via INTRAVENOUS

## 2015-06-14 MED ORDER — LEVALBUTEROL HCL 1.25 MG/0.5ML IN NEBU: 1.2500 mg | INHALATION_SOLUTION | Freq: Three times a day (TID) | RESPIRATORY_TRACT | Status: DC

## 2015-06-14 MED ORDER — POTASSIUM CHLORIDE CRYS ER 20 MEQ PO TBCR
40.0000 meq | EXTENDED_RELEASE_TABLET | Freq: Once | ORAL | Status: AC
  Administered 2015-06-14: 40 meq via ORAL
  Filled 2015-06-14: qty 2

## 2015-06-14 MED ORDER — LEVALBUTEROL HCL 1.25 MG/0.5ML IN NEBU
1.2500 mg | INHALATION_SOLUTION | Freq: Four times a day (QID) | RESPIRATORY_TRACT | Status: DC
  Administered 2015-06-14 (×3): 1.25 mg via RESPIRATORY_TRACT
  Filled 2015-06-14 (×3): qty 0.5

## 2015-06-14 MED ORDER — WARFARIN - PHARMACIST DOSING INPATIENT: Freq: Every day | Status: DC

## 2015-06-14 MED ORDER — LEVALBUTEROL HCL 1.25 MG/0.5ML IN NEBU: 1.2500 mg | INHALATION_SOLUTION | Freq: Four times a day (QID) | RESPIRATORY_TRACT | Status: DC | PRN

## 2015-06-14 MED ORDER — WARFARIN SODIUM 5 MG PO TABS: 5.0000 mg | ORAL_TABLET | ORAL | Status: DC

## 2015-06-14 MED ORDER — IPRATROPIUM BROMIDE 0.02 % IN SOLN: 0.5000 mg | Freq: Three times a day (TID) | RESPIRATORY_TRACT | Status: DC

## 2015-06-14 MED ORDER — MAGNESIUM SULFATE 2 GM/50ML IV SOLN
2.0000 g | Freq: Once | INTRAVENOUS | Status: AC
  Administered 2015-06-14: 2 g via INTRAVENOUS
  Filled 2015-06-14: qty 50

## 2015-06-14 MED ORDER — LEVALBUTEROL HCL 0.63 MG/3ML IN NEBU: 0.6300 mg | INHALATION_SOLUTION | RESPIRATORY_TRACT | Status: DC | PRN

## 2015-06-14 MED ORDER — IPRATROPIUM BROMIDE 0.02 % IN SOLN
0.5000 mg | Freq: Four times a day (QID) | RESPIRATORY_TRACT | Status: DC
Start: 2015-06-14 — End: 2015-06-14
  Administered 2015-06-14 (×3): 0.5 mg via RESPIRATORY_TRACT
  Filled 2015-06-14 (×3): qty 2.5

## 2015-06-14 NOTE — Progress Notes (Signed)
ANTICOAGULATION CONSULT NOTE - Initial Consult  Pharmacy Consult for Coumadin Indication: atrial fibrillation  No Known Allergies  Patient Measurements: Height: 6' (182.9 cm) Weight: 197 lb 15.6 oz (89.8 kg) (from 10/29/14) IBW/kg (Calculated) : 77.6  Vital Signs: Temp: 97.7 F (36.5 C) (03/27 0002) Temp Source: Oral (03/27 0002) BP: 106/55 mmHg (03/27 0002) Pulse Rate: 47 (03/27 0002)  Labs:  Recent Labs  06/13/15 1940 06/14/15  HGB 15.6  --   HCT 45.7  --   PLT 248  --   APTT  --  37  LABPROT  --  28.3*  INR  --  2.70*  CREATININE 1.56*  --   TROPONINI  --  0.03    Estimated Creatinine Clearance: 40.8 mL/min (by C-G formula based on Cr of 1.56).   Medical History: Past Medical History  Diagnosis Date  . Diabetes mellitus   . Thyroid disease   . Coronary artery disease   . COPD (chronic obstructive pulmonary disease) ([place])   . GERD (gastroesophageal reflux disease)   . Pulmonary embolism ([place])   . Hyperlipidemia   . FH: colonic polyps   . PAF (paroxysmal atrial fibrillation) ([place])   . Hypertension   . ASCVD (arteriosclerotic cardiovascular disease)   . Cancer ([place])   . Chronic systolic CHF (congestive heart failure) ([place])   . CKD (chronic kidney disease), stage II     Medications:  Prescriptions prior to admission  Medication Sig Dispense Refill Last Dose  . albuterol (PROVENTIL HFA;VENTOLIN HFA) 108 (90 BASE) MCG/ACT inhaler Inhale 2 puffs into the lungs every 6 (six) hours as needed for wheezing or shortness of breath. 1 Inhaler 2 not used  . atorvastatin (LIPITOR) 20 MG tablet TAKE 1 BY MOUTH DAILY (Patient taking differently: Take 20 mg by mouth every morning. TAKE 1 BY MOUTH DAILY) 90 tablet 0 06/13/2015 at Unknown time  . diltiazem (CARDIZEM CD) 240 MG 24 hr capsule Take 1 capsule (240 mg total) by mouth daily. 90 capsule 3 06/13/2015 at Unknown time  . fish oil-omega-3 fatty acids 1000 MG capsule Take 1 g by mouth 2 (two) times daily.    06/13/2015 at  am  . furosemide (LASIX) 20 MG tablet Take 20 mg by mouth daily.    06/13/2015 at Unknown time  . glipiZIDE (GLUCOTROL XL) 10 MG 24 hr tablet Take 10 mg by mouth daily with breakfast.    06/13/2015 at Unknown time  . guaifenesin (ROBITUSSIN) 100 MG/5ML syrup Take 200 mg by mouth 3 (three) times daily as needed for cough.   06/13/2015 at Unknown time  . JANUVIA 50 MG tablet Take 50 mg by mouth every morning.    06/13/2015 at Unknown time  . levothyroxine (SYNTHROID, LEVOTHROID) 112 MCG tablet Take 112 mcg by mouth daily.     06/13/2015 at Unknown time  . nitroGLYCERIN (NITROSTAT) 0.4 MG SL tablet Place 0.4 mg under the tongue every 5 (five) minutes as needed for chest pain.   not used  . omeprazole (PRILOSEC) 20 MG capsule Take 20 mg by mouth daily.     06/13/2015 at Unknown time  . warfarin (COUMADIN) 5 MG tablet Take as directed by [facility] (Patient taking differently: Take 5-7.5 mg by mouth every morning. Takes '5mg'$  on Thurs on ly Takes 7.'5mg'$  all other days) 135 tablet 1 06/13/2015 at Unknown time    Assessment: 82 y.o. M presents with cough, fever, and SOB. Pt on coumadin PTA for afib. Admit INR therapeutic on home dose. CBC  stable. Home dose: 7.'5mg'$  daily except for '5mg'$  on Thur - last dose 3/26  Goal of Therapy:  INR 2-3 Monitor platelets by anticoagulation protocol: Yes   Plan:  Daily INR Continue home dose of coumadin: 7.'5mg'$  daily except for '5mg'$  on [place] will sign off of abx monitoring and continue to follow for coumadin  Sherlon Handing, PharmD, BCPS Clinical pharmacist, pager [phone] 06/14/2015,1:50 AM

## 2015-06-14 NOTE — Progress Notes (Signed)
Physical Therapy Evaluation Patient Details Name: Justin Garner MRN: [id number] DOB: 08/16/1932 Today's Date: 06/14/2015   History of Present Illness  Justin Garner is a 82 y.o. male with PMH of hypertension, hyperlipidemia, COPD, GERD, hypothyroidism, CAD, pulmonary embolism, PAF on Coumadin, systolic congestive heart failure with EF 45%, chronic kidney disease-stage II, history of right upper lobe lung cancer (S/P surgery, radiation therapy and chemotherapy, followed up with oncologist for 5 years, currently not seeing oncologist), who presents with productive cough, fever and shortness of breath.  Clinical Impression  Pt admitted with above diagnosis. Pt currently with functional limitations due to the deficits listed below (see PT Problem List). Pt ambulated with RW but was unable to take steps without UE support. Pt moves well but has very poor functional activity tolerance. Pt will benefit from skilled PT to increase their independence and safety with mobility to allow discharge to the venue listed below.      Follow Up Recommendations Home health PT;Supervision/Assistance - 24 hour    Equipment Recommendations  Rolling walker with 5" wheels    Recommendations for Other Services OT consult     Precautions / Restrictions Precautions Precautions: Fall Restrictions Weight Bearing Restrictions: No      Mobility  Bed Mobility Overal bed mobility: Needs Assistance Bed Mobility: Supine to Sit     Supine to sit: Supervision     General bed mobility comments: pt able to get out of bed without use of handrails.   Transfers Overall transfer level: Needs assistance Equipment used: None Transfers: Sit to/from Stand Sit to Stand: Min guard         General transfer comment: Pt able to stand without AD, but seeked UE support once in standing.   Ambulation/Gait Ambulation/Gait assistance: Min guard Ambulation Distance (Feet): 30 Feet Assistive device: Rolling walker  (2 wheeled) Gait Pattern/deviations: Step-to pattern;Decreased stride length;Shuffle;Trunk flexed;Narrow base of support Gait velocity: very slow Gait velocity interpretation: Below normal speed for age/gender General Gait Details: Pt very slow shuffling gait with RW. pt unable to take steps without UE support.   Stairs            Wheelchair Mobility    Modified Rankin (Stroke Patients Only)       Balance Overall balance assessment: Needs assistance Sitting-balance support: No upper extremity supported;Feet unsupported Sitting balance-Leahy Scale: Good Sitting balance - Comments: pt able to sit EOB without feet on the floor and with his hands on his lap.    Standing balance support: Bilateral upper extremity supported Standing balance-Leahy Scale: Poor Standing balance comment: Reliant on UE support.                              Pertinent Vitals/Pain Pain Assessment: No/denies pain  Pt's vitals stable on room air. SaO2 >92% on room air.     Home Living Family/patient expects to be discharged to:: Private residence Living Arrangements: Spouse/significant other Available Help at Discharge: Family Type of Home: Mobile home Home Access: Stairs to enter Entrance Stairs-Rails: Can reach both;Left;Right Entrance Stairs-Number of Steps: 6 Home Layout: One level Home Equipment: Cane - single point      Prior Function Level of Independence: Independent         Comments: Pt states that he usually does not use his cane, but will occasionaly. He bathes and dresses himself, his wife cooks and cleans.      Hand Dominance   Dominant Hand:  Right    Extremity/Trunk Assessment   Upper Extremity Assessment: Generalized weakness           Lower Extremity Assessment: Generalized weakness      Cervical / Trunk Assessment: Kyphotic  Communication   Communication: No difficulties  Cognition Arousal/Alertness: Awake/alert Behavior During Therapy: Flat  affect Overall Cognitive Status: Within Functional Limits for tasks assessed                      General Comments General comments (skin integrity, edema, etc.): Pt pleasant and cooperative with therapy but very flat affect.     Exercises        Assessment/Plan    PT Assessment Patient needs continued PT services  PT Diagnosis Difficulty walking;Abnormality of gait;Generalized weakness   PT Problem List Decreased strength;Decreased activity tolerance;Decreased balance;Decreased knowledge of use of DME;Cardiopulmonary status limiting activity  PT Treatment Interventions DME instruction;Gait training;Stair training;Functional mobility training;Therapeutic activities;Therapeutic exercise;Balance training;Patient/family education   PT Goals (Current goals can be found in the Care Plan section) Acute Rehab PT Goals Patient Stated Goal: to go home PT Goal Formulation: With patient Time For Goal Achievement: 06/28/15 Potential to Achieve Goals: Good    Frequency Min 3X/week   Barriers to discharge        Co-evaluation               End of Session Equipment Utilized During Treatment: Gait belt Activity Tolerance: Patient tolerated treatment well Patient left: in chair;with call bell/phone within reach;with chair alarm set Nurse Communication: Mobility status         Time: 1338-1400 PT Time Calculation (min) (ACUTE ONLY): 22 min   Charges:   PT Evaluation $PT Eval Moderate Complexity: 1 Procedure     PT G Codes:       Colon Branch, SPT Colon Branch 06/14/2015, 3:36 PM

## 2015-06-14 NOTE — Plan of Care (Signed)
Problem: Phase I Progression Outcomes Goal: Flu/PneumoVaccines if indicated Outcome: Progressing Discussed with wife about his flu and pneumonia shot status and PCR flu swab.

## 2015-06-14 NOTE — Progress Notes (Signed)
CRITICAL VALUE ALERT  Critical value received:  Lactic Acid 2.1  Date of notification:  06/14/15  Time of notification:  0316  Critical value read back: yes  Nurse who received alert:  Candiss Norse RN  MD notified (1st page):  Fredirick Maudlin PA  Time of first page:  0329  MD notified (2nd page):  Time of second page:  Responding MD:  Fredirick Maudlin  Time MD responded:  See [facility]

## 2015-06-14 NOTE — Progress Notes (Signed)
[place] TEAM 1 - Stepdown/ICU TEAM PROGRESS NOTE  Justin Garner WVP:[id number] DOB: Aug 15, 1932 DOA: 06/13/2015 PCP: Kandice Hams, MD  Admit HPI / Brief Narrative: 82 y.o. male with Hx of hypertension, hyperlipidemia, COPD, GERD, hypothyroidism, CAD, pulmonary embolism, PAF on Coumadin, systolic congestive heart failure with EF 45%, chronic kidney disease stage II, right upper lobe lung cancer (S/P surgery, radiation therapy and chemotherapy, followed up with oncologist for 5 years, currently not seeing oncologist), who presented with productive cough, fever and shortness of breath.  In the ED, patient was found to have WBC 11.7, lactate of 2.47, temperature 102.4, tachycardia, tachypnea, positive urinalysis, potassium 2.9, worsening renal function. Oxygen desaturation to 89% on room air. Chest x-ray showed chronic changes similar to that noted on the prior exam, no acute infiltrate.  HPI/Subjective: The patient states he feels "about the same" as he did when he presented.  His wife is present and states he looks much better and is less short of breath.  He denies chest pain nausea vomiting or abdominal pain.  Assessment/Plan:  Acute on chronic respiratory failure with hypoxia - possible Pneumonia  Chest x-ray did not show an infiltrate, but clinically there is suspicion of CAP - there could also be contributions from COPD, as well as CHF - flu panel negative - resp virus panel pending - f/u CXR in AM - cont abx for now   COPD  No significant wheezing appreciated on exam - changed nebs to when necessary - no clear indication for steroids presently  Chronic systolic CHF 2-D echo on 07/21/60 showed EF 45% - no edema or JVD - no clinical evidence of signif volume overload - 89 kg is baseline weight Filed Weights   06/13/15 1900  Weight: 89.8 kg (197 lb 15.6 oz)   Chronic Atrial fibrillation with RVR CHA2DS2-VASc Score is 5 - on Coumadin at home - HR controlled on IV cardizem   Hx  of PE -On coumadin - INR in range   CAD No CP - continue Lipitor  HTN BP currently well controlled  Hypokalemia K 2.9 on admission - correcting hypomag + hypokalemia - f/u in AM   Hypomagnesemia Correct IV - f/u in AM   DM2 A1c 6.4 on 12/29/14 - CBG currently well-controlled  Hematuria  UA remarkable for signif blood - unclear is this was related to foley/or attempt at foley - recheck once hydrated and more stable - UA findings not suggestive of UTI   AKI prerenal secondary to dehydration - hold ACEI, ARB, diruetics, NSAIDs - f/u in AM s/p hydration   Hypothyroidism Continue home Synthroid  HLD Continue Lipitor  Code Status: FULL Family Communication: Spoke with wife and daughter at bedside Disposition Plan: SDU  Consultants: none  Procedures: none  Antibiotics: Azithro 3/26 > Rocephin 3/26 >  DVT prophylaxis: Coumadin per Rx  Objective: Blood pressure 121/75, pulse 80, temperature 98.5 F (36.9 C), temperature source Oral, resp. rate 33, height 6' (1.829 m), weight 89.8 kg (197 lb 15.6 oz), SpO2 98 %.  Intake/Output Summary (Last 24 hours) at 06/14/15 1749 Last data filed at 06/14/15 1424  Gross per 24 hour  Intake 6770.39 ml  Output    450 ml  Net 6320.39 ml   Exam: General: No acute respiratory distress at rest in chair  Lungs: Mild bibasilar crackles with no wheeze Cardiovascular: Regular rate without murmur gallop or rub normal S1 and S2 Abdomen: Nontender, nondistended, soft, bowel sounds positive, no rebound, no ascites, no appreciable mass  Extremities: No significant cyanosis, clubbing, or edema bilateral lower extremities  Data Reviewed:  Basic Metabolic Panel:  Recent Labs Lab 06/13/15 1940 06/14/15 0229  NA 139  --   K 2.9*  --   CL 104  --   CO2 20*  --   GLUCOSE 132*  --   BUN 29*  --   CREATININE 1.56*  --   CALCIUM 8.4*  --   MG  --  1.1*    CBC:  Recent Labs Lab 06/13/15 1940  WBC 11.7*  NEUTROABS 9.9*  HGB  15.6  HCT 45.7  MCV 87.7  PLT 248    Liver Function Tests:  Recent Labs Lab 06/13/15 1940  AST 24  ALT 18  ALKPHOS 111  BILITOT 3.5*  PROT 6.0*  ALBUMIN 3.0*   Coags:  Recent Labs Lab 06/14/15  INR 2.70*    Recent Labs Lab 06/14/15  APTT 37    Cardiac Enzymes:  Recent Labs Lab 06/14/15 06/14/15 0229 06/14/15 1049  TROPONINI 0.03 0.04* 0.03    CBG:  Recent Labs Lab 06/14/15 0048 06/14/15 0746 06/14/15 1217 06/14/15 1646  GLUCAP 135* 114* 138* 161*    Recent Results (from the past 240 hour(s))  Urine culture     Status: None   Collection Time: 06/13/15  7:21 PM  Result Value Ref Range Status   Specimen Description URINE, RANDOM  Final   Special Requests NONE  Final   Culture NO GROWTH 1 DAY  Final   Report Status 06/14/2015 FINAL  Final  Culture, blood (routine x 2)     Status: None (Preliminary result)   Collection Time: 06/13/15  7:30 PM  Result Value Ref Range Status   Specimen Description BLOOD FOREARM  Final   Special Requests BOTTLES DRAWN AEROBIC AND ANAEROBIC 5CC  Final   Culture NO GROWTH < 24 HOURS  Final   Report Status PENDING  Incomplete  Culture, blood (routine x 2)     Status: None (Preliminary result)   Collection Time: 06/13/15  7:43 PM  Result Value Ref Range Status   Specimen Description BLOOD RIGHT ANTECUBITAL  Final   Special Requests BOTTLES DRAWN AEROBIC AND ANAEROBIC 5CC  Final   Culture NO GROWTH < 24 HOURS  Final   Report Status PENDING  Incomplete  MRSA PCR Screening     Status: None   Collection Time: 06/14/15 12:52 AM  Result Value Ref Range Status   MRSA by PCR NEGATIVE NEGATIVE Final    Comment:        The GeneXpert MRSA Assay (FDA approved for NASAL specimens only), is one component of a comprehensive MRSA colonization surveillance program. It is not intended to diagnose MRSA infection nor to guide or monitor treatment for MRSA infections.   Culture, sputum-assessment     Status: None   Collection  Time: 06/14/15  4:45 AM  Result Value Ref Range Status   Specimen Description SPUTUM  Final   Special Requests NONE  Final   Sputum evaluation THIS SPECIMEN IS ACCEPTABLE FOR SPUTUM CULTURE  Final   Report Status 06/14/2015 FINAL  Final     Studies:   Recent x-ray studies have been reviewed in detail by the Attending Physician  Scheduled Meds:  Scheduled Meds: . antiseptic oral rinse  7 mL Mouth Rinse BID  . atorvastatin  20 mg Oral q morning - 10a  . azithromycin  250 mg Intravenous Q24H  . cefTRIAXone (ROCEPHIN)  IV  1 g Intravenous Q24H  .  dextromethorphan-guaiFENesin  1 tablet Oral BID  . diltiazem  240 mg Oral Daily  . insulin aspart  0-5 Units Subcutaneous QHS  . insulin aspart  0-9 Units Subcutaneous TID WC  . levothyroxine  112 mcg Oral QAC breakfast  . omega-3 acid ethyl esters  1 g Oral Daily  . pantoprazole  40 mg Oral Daily  . potassium chloride  40 mEq Oral Once  . [START ON 06/17/2015] warfarin  5 mg Oral Q Thu-1800  . warfarin  7.5 mg Oral Once per day on Sun Mon Tue Wed Fri Sat  . Warfarin - Pharmacist Dosing Inpatient   Does not apply q1800    Time spent on care of this patient: 35 mins   [facility] T , MD   [facility] Office  [phone] Pager - Text Page per Amion as per below:  On-Call/Text Page:      [email or web address]      password [email or web address]  If 7PM-7AM, please contact night-coverage [email or web address] Password [facility] 06/14/2015, 5:49 PM   LOS: 1 day

## 2015-06-15 ENCOUNTER — Inpatient Hospital Stay (HOSPITAL_COMMUNITY): Payer: Medicare Other

## 2015-06-15 DIAGNOSIS — J810 Acute pulmonary edema: Secondary | ICD-10-CM | POA: Diagnosis present

## 2015-06-15 DIAGNOSIS — J90 Pleural effusion, not elsewhere classified: Secondary | ICD-10-CM | POA: Diagnosis present

## 2015-06-15 DIAGNOSIS — E038 Other specified hypothyroidism: Secondary | ICD-10-CM

## 2015-06-15 DIAGNOSIS — I2510 Atherosclerotic heart disease of native coronary artery without angina pectoris: Secondary | ICD-10-CM | POA: Diagnosis present

## 2015-06-15 DIAGNOSIS — I10 Essential (primary) hypertension: Secondary | ICD-10-CM | POA: Diagnosis present

## 2015-06-15 DIAGNOSIS — J441 Chronic obstructive pulmonary disease with (acute) exacerbation: Secondary | ICD-10-CM | POA: Diagnosis present

## 2015-06-15 DIAGNOSIS — E118 Type 2 diabetes mellitus with unspecified complications: Secondary | ICD-10-CM | POA: Diagnosis present

## 2015-06-15 DIAGNOSIS — N179 Acute kidney failure, unspecified: Secondary | ICD-10-CM | POA: Diagnosis present

## 2015-06-15 DIAGNOSIS — I5022 Chronic systolic (congestive) heart failure: Secondary | ICD-10-CM

## 2015-06-15 DIAGNOSIS — R319 Hematuria, unspecified: Secondary | ICD-10-CM

## 2015-06-15 DIAGNOSIS — I482 Chronic atrial fibrillation, unspecified: Secondary | ICD-10-CM | POA: Diagnosis present

## 2015-06-15 DIAGNOSIS — E785 Hyperlipidemia, unspecified: Secondary | ICD-10-CM

## 2015-06-15 LAB — PROTIME-INR
INR: 4.43 — ABNORMAL HIGH (ref 0.00–1.49)
PROTHROMBIN TIME: 41 s — AB (ref 11.6–15.2)

## 2015-06-15 LAB — COMPREHENSIVE METABOLIC PANEL
ALK PHOS: 142 U/L — AB (ref 38–126)
ALT: 21 U/L (ref 17–63)
ANION GAP: 9 (ref 5–15)
AST: 28 U/L (ref 15–41)
Albumin: 2.7 g/dL — ABNORMAL LOW (ref 3.5–5.0)
BUN: 24 mg/dL — ABNORMAL HIGH (ref 6–20)
CALCIUM: 8.4 mg/dL — AB (ref 8.9–10.3)
CHLORIDE: 107 mmol/L (ref 101–111)
CO2: 22 mmol/L (ref 22–32)
Creatinine, Ser: 1.02 mg/dL (ref 0.61–1.24)
Glucose, Bld: 152 mg/dL — ABNORMAL HIGH (ref 65–99)
Potassium: 4 mmol/L (ref 3.5–5.1)
SODIUM: 138 mmol/L (ref 135–145)
Total Bilirubin: 1.9 mg/dL — ABNORMAL HIGH (ref 0.3–1.2)
Total Protein: 6.1 g/dL — ABNORMAL LOW (ref 6.5–8.1)

## 2015-06-15 LAB — BLOOD GAS, ARTERIAL
ACID-BASE DEFICIT: 3.8 mmol/L — AB (ref 0.0–2.0)
BICARBONATE: 21.5 meq/L (ref 20.0–24.0)
DRAWN BY: 129711
O2 CONTENT: 3 L/min
O2 SAT: 86.1 %
PH ART: 7.306 — AB (ref 7.350–7.450)
Patient temperature: 98.6
TCO2: 22.8 mmol/L (ref 0–100)
pCO2 arterial: 44.3 mmHg (ref 35.0–45.0)
pO2, Arterial: 60.5 mmHg — ABNORMAL LOW (ref 80.0–100.0)

## 2015-06-15 LAB — CBC
HCT: 44.7 % (ref 39.0–52.0)
HEMOGLOBIN: 14.8 g/dL (ref 13.0–17.0)
MCH: 29.7 pg (ref 26.0–34.0)
MCHC: 33.1 g/dL (ref 30.0–36.0)
MCV: 89.8 fL (ref 78.0–100.0)
Platelets: 274 10*3/uL (ref 150–400)
RBC: 4.98 MIL/uL (ref 4.22–5.81)
RDW: 15.7 % — ABNORMAL HIGH (ref 11.5–15.5)
WBC: 15.4 10*3/uL — AB (ref 4.0–10.5)

## 2015-06-15 LAB — LIPID PANEL
CHOLESTEROL: 78 mg/dL (ref 0–200)
HDL: 15 mg/dL — AB (ref >40)
LDL Cholesterol: 43 mg/dL (ref 0–99)
TRIGLYCERIDES: 98 mg/dL (ref <150)
Total CHOL/HDL Ratio: 5.2 RATIO
VLDL: 20 mg/dL (ref 0–40)

## 2015-06-15 LAB — GLUCOSE, CAPILLARY
GLUCOSE-CAPILLARY: 204 mg/dL — AB (ref 65–99)
Glucose-Capillary: 155 mg/dL — ABNORMAL HIGH (ref 65–99)
Glucose-Capillary: 227 mg/dL — ABNORMAL HIGH (ref 65–99)
Glucose-Capillary: 329 mg/dL — ABNORMAL HIGH (ref 65–99)

## 2015-06-15 LAB — UREA NITROGEN, URINE: Urea Nitrogen, Ur: 1686 mg/dL

## 2015-06-15 LAB — MAGNESIUM
MAGNESIUM: 1.7 mg/dL (ref 1.7–2.4)
MAGNESIUM: 1.7 mg/dL (ref 1.7–2.4)

## 2015-06-15 MED ORDER — METOPROLOL TARTRATE 1 MG/ML IV SOLN
5.0000 mg | Freq: Once | INTRAVENOUS | Status: AC
  Administered 2015-06-15: 5 mg via INTRAVENOUS
  Filled 2015-06-15: qty 5

## 2015-06-15 MED ORDER — DIPHENHYDRAMINE HCL 25 MG PO CAPS
25.0000 mg | ORAL_CAPSULE | Freq: Once | ORAL | Status: AC
  Administered 2015-06-15: 25 mg via ORAL
  Filled 2015-06-15: qty 1

## 2015-06-15 MED ORDER — METHYLPREDNISOLONE SODIUM SUCC 125 MG IJ SOLR
60.0000 mg | Freq: Two times a day (BID) | INTRAMUSCULAR | Status: DC
  Administered 2015-06-15 – 2015-06-16 (×3): 60 mg via INTRAVENOUS
  Filled 2015-06-15 (×3): qty 2

## 2015-06-15 MED ORDER — METOPROLOL TARTRATE 1 MG/ML IV SOLN
7.5000 mg | Freq: Once | INTRAVENOUS | Status: AC
  Administered 2015-06-15: 7.5 mg via INTRAVENOUS
  Filled 2015-06-15: qty 10

## 2015-06-15 MED ORDER — MAGNESIUM SULFATE 2 GM/50ML IV SOLN
2.0000 g | Freq: Once | INTRAVENOUS | Status: AC
Start: 2015-06-15 — End: 2015-06-15
  Administered 2015-06-15: 2 g via INTRAVENOUS
  Filled 2015-06-15: qty 50

## 2015-06-15 MED ORDER — DILTIAZEM HCL 100 MG IV SOLR
5.0000 mg/h | INTRAVENOUS | Status: DC
  Administered 2015-06-15 – 2015-06-16 (×2): 5 mg/h via INTRAVENOUS
  Administered 2015-06-16: 10 mg/h via INTRAVENOUS
  Filled 2015-06-15 (×3): qty 100

## 2015-06-15 MED ORDER — ACETAMINOPHEN 325 MG PO TABS
650.0000 mg | ORAL_TABLET | ORAL | Status: DC | PRN
  Administered 2015-06-15 – 2015-06-23 (×2): 650 mg via ORAL
  Filled 2015-06-15 (×2): qty 2

## 2015-06-15 MED ORDER — FUROSEMIDE 10 MG/ML IJ SOLN
80.0000 mg | Freq: Once | INTRAMUSCULAR | Status: AC
  Administered 2015-06-15: 80 mg via INTRAVENOUS
  Filled 2015-06-15: qty 8

## 2015-06-15 MED ORDER — LEVALBUTEROL HCL 0.63 MG/3ML IN NEBU
0.6300 mg | INHALATION_SOLUTION | Freq: Four times a day (QID) | RESPIRATORY_TRACT | Status: DC
  Administered 2015-06-15 – 2015-06-16 (×6): 0.63 mg via RESPIRATORY_TRACT
  Filled 2015-06-15 (×6): qty 3

## 2015-06-15 NOTE — Plan of Care (Signed)
Problem: Education: Goal: Knowledge of [place] [facility] Education information/materials will improve Outcome: Progressing Discussed his heart going in and out of rapid a-fib.  Reassured the patient and contacted the on-call MD for medication see (MAR)

## 2015-06-15 NOTE — Progress Notes (Signed)
Physical Therapy Treatment Patient Details Name: Justin Garner MRN: [id number] DOB: [birth year] Today's Date: 06/15/2015    History of Present Illness Justin Garner is a 82 y.o. male with PMH of hypertension, hyperlipidemia, COPD, GERD, hypothyroidism, CAD, pulmonary embolism, PAF on Coumadin, systolic congestive heart failure with EF 45%, chronic kidney disease-stage II, history of right upper lobe lung cancer (S/P surgery, radiation therapy and chemotherapy, followed up with oncologist for 5 years, currently not seeing oncologist), who presents with productive cough, fever and shortness of breath.    PT Comments    Pt admitted with above diagnosis. Pt currently with functional limitations due to balance, strength and endurance deficits. Pt was able to stand pivot to chair but PT limited treatment due to INR 4.43.  Pt had a lot of congestion therefore important for pt to sit up in chair.  Chair alarm in place.  Pt was on 6LO2 with sats 91-93%.  Will continue acute PT.   Pt will benefit from skilled PT to increase their independence and safety with mobility to allow discharge to the venue listed below.    Follow Up Recommendations  Home health PT;Supervision/Assistance - 24 hour     Equipment Recommendations  Rolling walker with 5" wheels    Recommendations for Other Services OT consult     Precautions / Restrictions Precautions Precautions: Fall Restrictions Weight Bearing Restrictions: No    Mobility  Bed Mobility Overal bed mobility: Needs Assistance Bed Mobility: Supine to Sit     Supine to sit: Supervision     General bed mobility comments: pt able to get out of bed without use of handrails.   Transfers Overall transfer level: Needs assistance Equipment used: None Transfers: Sit to/from [facility] Sit to Stand: Min assist Stand pivot transfers: Min assist       General transfer comment: Pt able to stand without AD, but seeked UE support  upon standing.  Pt held onto PT's UEs to step around to chair.    Ambulation/Gait                 Stairs            Wheelchair Mobility    Modified Rankin (Stroke Patients Only)       Balance Overall balance assessment: Needs assistance;History of Falls Sitting-balance support: No upper extremity supported;Feet supported Sitting balance-Leahy Scale: Good Sitting balance - Comments: pt able to sit EOB without feet on the floor and with his hands on his lap.    Standing balance support: Bilateral upper extremity supported;During functional activity Standing balance-Leahy Scale: Poor Standing balance comment: Relies on UEs for support.                     Cognition Arousal/Alertness: Awake/alert Behavior During Therapy: Flat affect Overall Cognitive Status: Within Functional Limits for tasks assessed                      Exercises      General Comments        Pertinent Vitals/Pain Pain Assessment: No/denies pain  91-93% on 6LO2, 98-123 bpm with afib.    Home Living                      Prior Function            PT Goals (current goals can now be found in the care plan section) Progress towards PT goals: Progressing toward goals  Frequency  Min 3X/week    PT Plan Current plan remains appropriate    Co-evaluation             End of Session Equipment Utilized During Treatment: Gait belt;Oxygen Activity Tolerance: Patient limited by fatigue Patient left: in chair;with call bell/phone within reach;with chair alarm set     Time: 6503-5465 PT Time Calculation (min) (ACUTE ONLY): 15 min  Charges:  $Therapeutic Activity: 8-22 mins                    G CodesIrwin Garner F 2015/07/14, 10:17 AM Justin Garner Acute Rehabilitation [phone] (pager)

## 2015-06-15 NOTE — Progress Notes (Signed)
ANTICOAGULATION CONSULT NOTE - Initial Consult  Pharmacy Consult for Coumadin Indication: atrial fibrillation  No Known Allergies  Patient Measurements: Height: 6' (182.9 cm) Weight: 197 lb 15.6 oz (89.8 kg) (from 10/29/14) IBW/kg (Calculated) : 77.6  Vital Signs: Temp: 97.8 F (36.6 C) (03/28 0828) Temp Source: Oral (03/28 0828) BP: 161/115 mmHg (03/28 0828) Pulse Rate: 80 (03/28 0900)  Labs:  Recent Labs  06/13/15 1940 06/14/15 06/14/15 0229 06/14/15 1049 06/14/15 1804 06/15/15 0519  HGB 15.6  --   --   --   --  14.8  HCT 45.7  --   --   --   --  44.7  PLT 248  --   --   --   --  274  APTT  --  37  --   --   --   --   LABPROT  --  28.3*  --   --   --  41.0*  INR  --  2.70*  --   --   --  4.43*  CREATININE 1.56*  --   --   --  1.06 1.02  TROPONINI  --  0.03 0.04* 0.03  --   --     Estimated Creatinine Clearance: 62.3 mL/min (by C-G formula based on Cr of 1.02).  Assessment: 82 y.o. M presents with cough, fever, and SOB  PMH: HTN, HLD, COPD, GERD, hypothyroidism, CAD, h/o PE, PAF, syst HF, CKD stage 2, h/o lung ca  Anticoagulation: Coumadin 7.'5mg'$  daily exc for '5mg'$  on Thurs PTA for afib. Admit INR therapeutic on home dose. INR this am is 4.43  Infectious Disease: abx for CAP. Afebrile, WBC 11.7.  Nephrology: SCr 1.02  Heme: H&H 14.8/44.7, Plt 274  Goal of Therapy:  INR 2-3 Monitor platelets by anticoagulation protocol: Yes   Plan:  Hold warfarin this evening Monitor daily INR  CBC, s/s of bleed  Justin Garner, PharmD, BCPS, [facility] Clinical Pharmacist Pager [phone] 06/15/2015 1:22 PM

## 2015-06-15 NOTE — Evaluation (Signed)
Occupational Therapy Evaluation Patient Details Name: UEL DAVIDOW MRN: [id number] DOB: 1932-07-08 Today's Date: 06/15/2015    History of Present Illness ALVIS EDGELL is a 82 y.o. male with PMH of hypertension, hyperlipidemia, COPD, GERD, hypothyroidism, CAD, pulmonary embolism, PAF on Coumadin, systolic congestive heart failure with EF 45%, chronic kidney disease-stage II, history of right upper lobe lung cancer (S/P surgery, radiation therapy and chemotherapy, followed up with oncologist for 5 years, currently not seeing oncologist), who presents with productive cough, fever and shortness of breath.   Clinical Impression   PT admitted with see the above statement. Pt currently with functional limitiations due to the deficits listed below (see OT problem list). PTA mod i at home with all adls.  Pt will benefit from skilled OT to increase their independence and safety with adls and balance to allow discharge [place] with 3n1.     Follow Up Recommendations  Home health OT    Equipment Recommendations  3 in 1 bedside comode    Recommendations for Other Services       Precautions / Restrictions Precautions Precautions: Fall Restrictions Weight Bearing Restrictions: No      Mobility Bed Mobility Overal bed mobility: Needs Assistance Bed Mobility: Supine to Sit     Supine to sit: Supervision     General bed mobility comments: inc hair on arrival  Transfers Overall transfer level: Needs assistance Equipment used: None Transfers: Sit to/from Stand Sit to Stand: Min guard Stand pivot transfers: Min assist       General transfer comment: cues for hand placement and positioning inside the RW    Balance Overall balance assessment: Needs assistance Sitting-balance support: No upper extremity supported;Feet supported Sitting balance-Leahy Scale: Good Sitting balance - Comments: pt able to sit EOB without feet on the floor and with his hands on his lap.    Standing  balance support: No upper extremity supported;During functional activity Standing balance-Leahy Scale: Fair Standing balance comment: Relies on UEs for support.                             ADL Overall ADL's : Needs assistance/impaired Eating/Feeding: Set up;Sitting   Grooming: Wash/dry hands;Oral care;Min guard;Standing Grooming Details (indicate cue type and reason): incr time to wash hands then don dentures                 Toilet Transfer: Min guard;Ambulation;RW Toilet Transfer Details (indicate cue type and reason): simulated from chair with bil UE use required         Functional mobility during ADLs: Minimal assistance;Rolling walker General ADL Comments: cues to remain inside the RW and to navigate the very narrow space in patients room     Vision     Perception     Praxis      Pertinent Vitals/Pain Pain Assessment: No/denies pain     Hand Dominance Right   Extremity/Trunk Assessment Upper Extremity Assessment Upper Extremity Assessment: Generalized weakness   Lower Extremity Assessment Lower Extremity Assessment: Generalized weakness   Cervical / Trunk Assessment Cervical / Trunk Assessment: Kyphotic   Communication Communication Communication: No difficulties   Cognition Arousal/Alertness: Awake/alert Behavior During Therapy: Flat affect Overall Cognitive Status: Within Functional Limits for tasks assessed                     General Comments       Exercises       Shoulder Instructions  Home Living Family/patient expects to be discharged to:: Private residence Living Arrangements: Spouse/significant other Available Help at Discharge: Family Type of Home: Mobile home Home Access: Stairs to enter Technical brewer of Steps: 6 Entrance Stairs-Rails: Can reach both;Left;Right Home Layout: One level     Bathroom Shower/Tub: Occupational psychologist: [place] - single  point          Prior Functioning/Environment Level of Independence: Independent        Comments: Pt states that he usually does not use his cane, but will occasionaly. He bathes and dresses himself, his wife cooks and cleans.     OT Diagnosis: Generalized weakness   OT Problem List: Decreased strength;Decreased activity tolerance;Impaired balance (sitting and/or standing);Decreased cognition;Decreased safety awareness;Decreased knowledge of use of DME or AE;Decreased knowledge of precautions;Cardiopulmonary status limiting activity   OT Treatment/Interventions: Self-care/ADL training;Therapeutic exercise;Energy conservation;DME and/or AE instruction;Therapeutic activities;Patient/family education;Balance training    OT Goals(Current goals can be found in the care plan section) Acute Rehab OT Goals Patient Stated Goal: to go home OT Goal Formulation: With patient/family Time For Goal Achievement: 06/29/15 Potential to Achieve Goals: Good  OT Frequency: Min 2X/week   Barriers to D/C:            Co-evaluation              End of Session Equipment Utilized During Treatment: Gait belt;Rolling walker Nurse Communication: Mobility status;Precautions  Activity Tolerance: Patient tolerated treatment well Patient left: in chair;with call bell/phone within reach;with family/visitor present   Time: 1694-5038 OT Time Calculation (min): 30 min Charges:  OT General Charges $OT Visit: 1 Procedure OT Evaluation $OT Eval High Complexity: 1 Procedure OT Treatments $Self Care/Home Management : 8-22 mins G-Codes:    Peri Maris 20-Jun-2015, 1:58 PM    Jeri Modena   OTR/L Pager: [phone] Office: [phone] .

## 2015-06-15 NOTE — Progress Notes (Signed)
Responded to Bipap alarm at this time, pt agitated, stating he is not wearing this and to take mask off his face now, pt taken off and placed on 6L [place].  Pt has inc WOB, explained to pt that it would improve his WOB, pt stated that he is not wearing, RT will monitor

## 2015-06-15 NOTE — Progress Notes (Signed)
[facility] TEAM 1 - Stepdown/ICU TEAM Progress Note  Justin Garner GYJ:[id number] DOB: [birth year] DOA: 06/13/2015 PCP: Kandice Hams, MD  Admit HPI / Brief Narrative: 82 y.o. WM PMHx HTN, Chronic Systolic CHF with EF 26%, Paroxysmal Atrial Fibrillation on Coumadin, CAD native artery, HLD, COPD, Hx RUL Lung cancer (S/P surgery, radiation therapy and chemotherapy, followed up with oncologist for 5 years(currently not seeing oncologist), Hypothyroidism, CKDstage II, ,   Presents with productive cough, fever and shortness of breath. Patient reports that he has been having cough, fever, shortness of breath in the past several days, which has been progressively getting worse. He coughs up yellow colored sputum. No chest pain. Patient does not have nausea, vomiting, abdominal pain or unilateral weakness. He has positive urinalysis for UTI, but denies any symptoms.  In ED, patient was found to have WBC 11.7, lactate of 2.47, temperature 102.4, tachycardia, tachypnea, positive urinalysis for UTI, potassium 2.9, worsening renal function. Oxygen desaturation to 89% on room air. Chest x-ray showed chronic changes similar to that noted on the prior exam, no acute infiltrate is seen. Patients and admitted to inpatient for further evaluation and treatment.   EKG: Independently reviewed. QTC 426, atrial fibrillation with RVR, occasional PVC, RAD  HPI/Subjective: 3/28 received page the patient had been in A. fib RVR with increased WOB, SOB since~2400 last night. Had only received metoprolol IV 5 mg 1. Upon arrival patient alert and oriented 4 sitting in chair positive acute respiratory distress. Positive productive sputum (thick green/yellow)  Assessment/Plan: Sepsis/Acute on chronic respiratory failure with hypoxia/ CAP  -On admission medical care for sepsis RR> 20, temp> 38C, HR> 90 -3/28 PCXR consistent with pneumonia/fluid overload see results below  - flu panel negative - resp virus panel  pending   - Solu-Medrol 60 mg daily -Physiotherapy vest BID -Mucinex DM BID  Respiratory acidosis -See sepsis  COPD  -Wheezing this a.m. Xopenex QID  Right pleural effusion/Pulmonary Edema -Lasix 80 mg 1  Chronic systolic CHF(Baseline weight= 89 kg) -2-D echo on 07/18/13 showed EF 45% - no edema or JVD -Echocardiogram pending  Chronic Atrial fibrillation with RVR -CHA2DS2-VASc Score is 5 - on Coumadin at home  -uncontrolled HR on PO medication x~9 hours -Metoprolol IV 7.5 mg 1 -Initiate Cardizem drip, would not use amiodarone secondary to patient's COPD, Hx RUL Lung Ca S/P XRT+ chemotherapy. -Coumadin per pharmacy; currently supratherapeutic  CAD -Lipitor 20 g daily  -Lipid panel pending   HTN BP currently well controlled  Hx of PE -See chronic atrial fibrillation with RVR   Hypokalemia -Potassium goal> 4 -WNL   Hypomagnesemia -Magnesium goal >2 -Magnesium IV 2 gm    DM2 Type 2 with com[plications  -37/85/8850 hemoglobin A1c =6.4   - CBG currently well-controlled  Hematuria  UA remarkable for signif blood - unclear is this was related to foley/or attempt at foley - recheck once hydrated and more stable - UA findings not suggestive of UTI   AKI -Resolved  -Continue to hold nephrotoxic medication  -Continue to hold ACEI, ARB, NSAIDs    Hypothyroidism Continue home Synthroid 112 g daily  HLD Continue Lipitor 20 mg daily  Goals of care -Explained in detail the difference between DO NOT RESUSCITATE and full code at the completion of our discussion patient elected DO NOT RESUSCITATE   Code Status: DO NOT RESUSCITATE Family Communication: no family present at time of exam Disposition Plan: Resolution sepsis    Consultants: NA  Procedure/Significant Events: 3/28 PCXR;Worsening of bilateral interstitial infiltrates  has occurred. -A small right pleural effusion   Culture 3/26 urine negative final 3/26 blood forearm, right forearm NGTD 3/27 MRSA  by PCR negative 3/27 sputum pending 3/27 influenza A/B/H1N1 negative 3/27 respiratory virus panel pending 3/28 strep pneumo urine antigen/Legionella urine antigen pending   Antibiotics: Azithromycin 3/26>> Rocephin 3/26>>  DVT prophylaxis: Coumadin per pharmacy   Devices    LINES / TUBES:      Continuous Infusions: . sodium chloride 10 mL/hr at 06/14/15 1621    Objective: VITAL SIGNS: Temp: 98 F (36.7 C) (03/28 0340) Temp Source: Oral (03/28 0340) BP: 163/102 mmHg (03/28 0720) Pulse Rate: 85 (03/28 0720) SPO2; FIO2:   Intake/Output Summary (Last 24 hours) at 06/15/15 5277 Last data filed at 06/15/15 0600  Gross per 24 hour  Intake 2266.22 ml  Output    850 ml  Net 1416.22 ml     Exam: General: A/O 4, positive acute respiratory distress Eyes: Negative headache, negative scleral hemorrhage ENT: Negative Runny nose, negative gingival bleeding, Neck:  Negative scars, masses, torticollis, lymphadenopathy, JVD Lungs: tachypnea, diffuse rhonchi, positive expiratory wheezing diffusely, negative crackles, productive sputum thick green/yellow Cardiovascular: Tachycardic, Regular rhythm without murmur gallop or rub normal S1 and S2 Abdomen:negative abdominal pain, nondistended, positive soft, bowel sounds, no rebound, no ascites, no appreciable mass Extremities: No significant cyanosis, clubbing, or edema bilateral lower extremities Psychiatric:  Negative depression, negative anxiety, negative fatigue, negative mania  Neurologic:  Cranial nerves II through XII intact, tongue/uvula midline, all extremities muscle strength 5/5, sensation intact throughout, negative dysarthria, negative expressive aphasia, negative receptive aphasia.   Data Reviewed: Basic Metabolic Panel:  Recent Labs Lab 06/13/15 1940 06/14/15 0229 06/14/15 1804  NA 139  --  138  K 2.9*  --  3.6  CL 104  --  107  CO2 20*  --  21*  GLUCOSE 132*  --  178*  BUN 29*  --  26*  CREATININE  1.56*  --  1.06  CALCIUM 8.4*  --  8.1*  MG  --  1.1* 1.7   Liver Function Tests:  Recent Labs Lab 06/13/15 1940  AST 24  ALT 18  ALKPHOS 111  BILITOT 3.5*  PROT 6.0*  ALBUMIN 3.0*   No results for input(s): LIPASE, AMYLASE in the last 168 hours. No results for input(s): AMMONIA in the last 168 hours. CBC:  Recent Labs Lab 06/13/15 1940 06/15/15 0519  WBC 11.7* 15.4*  NEUTROABS 9.9*  --   HGB 15.6 14.8  HCT 45.7 44.7  MCV 87.7 89.8  PLT 248 274   Cardiac Enzymes:  Recent Labs Lab 06/14/15 06/14/15 0229 06/14/15 1049  TROPONINI 0.03 0.04* 0.03   BNP (last 3 results)  Recent Labs  06/14/15 0306  BNP 189.0*    ProBNP (last 3 results) No results for input(s): PROBNP in the last 8760 hours.  CBG:  Recent Labs Lab 06/14/15 0048 06/14/15 0746 06/14/15 1217 06/14/15 1646 06/14/15 2125  GLUCAP 135* 114* 138* 161* 170*    Recent Results (from the past 240 hour(s))  Urine culture     Status: None   Collection Time: 06/13/15  7:21 PM  Result Value Ref Range Status   Specimen Description URINE, RANDOM  Final   Special Requests NONE  Final   Culture NO GROWTH 1 DAY  Final   Report Status 06/14/2015 FINAL  Final  Culture, blood (routine x 2)     Status: None (Preliminary result)   Collection Time: 06/13/15  7:30 PM  Result Value Ref Range Status   Specimen Description BLOOD FOREARM  Final   Special Requests BOTTLES DRAWN AEROBIC AND ANAEROBIC 5CC  Final   Culture NO GROWTH < 24 HOURS  Final   Report Status PENDING  Incomplete  Culture, blood (routine x 2)     Status: None (Preliminary result)   Collection Time: 06/13/15  7:43 PM  Result Value Ref Range Status   Specimen Description BLOOD RIGHT ANTECUBITAL  Final   Special Requests BOTTLES DRAWN AEROBIC AND ANAEROBIC 5CC  Final   Culture NO GROWTH < 24 HOURS  Final   Report Status PENDING  Incomplete  MRSA PCR Screening     Status: None   Collection Time: 06/14/15 12:52 AM  Result Value Ref Range  Status   MRSA by PCR NEGATIVE NEGATIVE Final    Comment:        The GeneXpert MRSA Assay (FDA approved for NASAL specimens only), is one component of a comprehensive MRSA colonization surveillance program. It is not intended to diagnose MRSA infection nor to guide or monitor treatment for MRSA infections.   Culture, sputum-assessment     Status: None   Collection Time: 06/14/15  4:45 AM  Result Value Ref Range Status   Specimen Description SPUTUM  Final   Special Requests NONE  Final   Sputum evaluation THIS SPECIMEN IS ACCEPTABLE FOR SPUTUM CULTURE  Final   Report Status 06/14/2015 FINAL  Final  Culture, respiratory (NON-Expectorated)     Status: None (Preliminary result)   Collection Time: 06/14/15  4:45 AM  Result Value Ref Range Status   Specimen Description SPUTUM  Final   Special Requests NONE  Final   Gram Stain PENDING  Incomplete   Culture   Final    Culture reincubated for better growth Performed at [facility]    Report Status PENDING  Incomplete     Studies:  Recent x-ray studies have been reviewed in detail by the Attending Physician  Scheduled Meds:  Scheduled Meds: . antiseptic oral rinse  7 mL Mouth Rinse BID  . atorvastatin  20 mg Oral q morning - 10a  . azithromycin  250 mg Intravenous Q24H  . cefTRIAXone (ROCEPHIN)  IV  1 g Intravenous Q24H  . dextromethorphan-guaiFENesin  1 tablet Oral BID  . diltiazem  240 mg Oral Daily  . insulin aspart  0-5 Units Subcutaneous QHS  . insulin aspart  0-9 Units Subcutaneous TID WC  . levothyroxine  112 mcg Oral QAC breakfast  . omega-3 acid ethyl esters  1 g Oral Daily  . pantoprazole  40 mg Oral Daily  . [START ON 06/17/2015] warfarin  5 mg Oral Q Thu-1800  . Warfarin - Pharmacist Dosing Inpatient   Does not apply q1800    Time spent on care of this patient: 40 mins   Leonilda Cozby, Geraldo Docker , MD  [facility] Office  [phone] Pager - [phone]  On-Call/Text Page:      [email or web address]       password [email or web address]  If 7PM-7AM, please contact night-coverage [email or web address] Password [email or web address] 06/15/2015, 7:28 AM   LOS: 2 days   Care during the described time interval was provided by me .  I have reviewed this patient's available data, including medical history, events of note, physical examination, and all test results as part of my evaluation. I have personally reviewed and interpreted all radiology studies.   Dia Crawford, MD [phone] Pager

## 2015-06-15 NOTE — Progress Notes (Signed)
Placed pt on Bipap per MD.  Pt tolerating well, RT will monitor.

## 2015-06-16 ENCOUNTER — Inpatient Hospital Stay (HOSPITAL_COMMUNITY): Payer: Medicare Other

## 2015-06-16 DIAGNOSIS — I509 Heart failure, unspecified: Secondary | ICD-10-CM

## 2015-06-16 LAB — RESPIRATORY VIRUS PANEL
ADENOVIRUS: NEGATIVE
INFLUENZA A: NEGATIVE
Influenza B: NEGATIVE
Metapneumovirus: NEGATIVE
PARAINFLUENZA 3 A: NEGATIVE
Parainfluenza 1: NEGATIVE
Parainfluenza 2: NEGATIVE
RESPIRATORY SYNCYTIAL VIRUS B: NEGATIVE
RHINOVIRUS: NEGATIVE
Respiratory Syncytial Virus A: NEGATIVE

## 2015-06-16 LAB — CULTURE, RESPIRATORY: CULTURE: NORMAL

## 2015-06-16 LAB — CBC WITH DIFFERENTIAL/PLATELET
BASOS ABS: 0 10*3/uL (ref 0.0–0.1)
BASOS PCT: 0 %
EOS ABS: 0 10*3/uL (ref 0.0–0.7)
Eosinophils Relative: 0 %
HEMATOCRIT: 43.9 % (ref 39.0–52.0)
HEMOGLOBIN: 14.7 g/dL (ref 13.0–17.0)
Lymphocytes Relative: 6 %
Lymphs Abs: 0.8 10*3/uL (ref 0.7–4.0)
MCH: 29.8 pg (ref 26.0–34.0)
MCHC: 33.5 g/dL (ref 30.0–36.0)
MCV: 89 fL (ref 78.0–100.0)
MONOS PCT: 5 %
Monocytes Absolute: 0.6 10*3/uL (ref 0.1–1.0)
NEUTROS ABS: 12.3 10*3/uL — AB (ref 1.7–7.7)
NEUTROS PCT: 89 %
Platelets: 327 10*3/uL (ref 150–400)
RBC: 4.93 MIL/uL (ref 4.22–5.81)
RDW: 15.4 % (ref 11.5–15.5)
WBC: 13.8 10*3/uL — AB (ref 4.0–10.5)

## 2015-06-16 LAB — COMPREHENSIVE METABOLIC PANEL
ALK PHOS: 139 U/L — AB (ref 38–126)
ALT: 23 U/L (ref 17–63)
ANION GAP: 10 (ref 5–15)
AST: 29 U/L (ref 15–41)
Albumin: 2.6 g/dL — ABNORMAL LOW (ref 3.5–5.0)
BILIRUBIN TOTAL: 1.3 mg/dL — AB (ref 0.3–1.2)
BUN: 24 mg/dL — ABNORMAL HIGH (ref 6–20)
CALCIUM: 8.7 mg/dL — AB (ref 8.9–10.3)
CO2: 26 mmol/L (ref 22–32)
CREATININE: 1.19 mg/dL (ref 0.61–1.24)
Chloride: 104 mmol/L (ref 101–111)
GFR, EST NON AFRICAN AMERICAN: 55 mL/min — AB (ref >60)
Glucose, Bld: 227 mg/dL — ABNORMAL HIGH (ref 65–99)
Potassium: 3.4 mmol/L — ABNORMAL LOW (ref 3.5–5.1)
SODIUM: 140 mmol/L (ref 135–145)
TOTAL PROTEIN: 5.9 g/dL — AB (ref 6.5–8.1)

## 2015-06-16 LAB — GLUCOSE, CAPILLARY
GLUCOSE-CAPILLARY: 218 mg/dL — AB (ref 65–99)
GLUCOSE-CAPILLARY: 251 mg/dL — AB (ref 65–99)
GLUCOSE-CAPILLARY: 288 mg/dL — AB (ref 65–99)
GLUCOSE-CAPILLARY: 291 mg/dL — AB (ref 65–99)
Glucose-Capillary: 304 mg/dL — ABNORMAL HIGH (ref 65–99)
Glucose-Capillary: 333 mg/dL — ABNORMAL HIGH (ref 65–99)

## 2015-06-16 LAB — ECHOCARDIOGRAM COMPLETE
HEIGHTINCHES: 72 in
WEIGHTICAEL: 3167.57 [oz_av]

## 2015-06-16 LAB — CULTURE, RESPIRATORY W GRAM STAIN

## 2015-06-16 LAB — PROTIME-INR
INR: 5.57 (ref 0.00–1.49)
PROTHROMBIN TIME: 48.3 s — AB (ref 11.6–15.2)

## 2015-06-16 LAB — MAGNESIUM: Magnesium: 2 mg/dL (ref 1.7–2.4)

## 2015-06-16 MED ORDER — POTASSIUM CHLORIDE CRYS ER 20 MEQ PO TBCR
40.0000 meq | EXTENDED_RELEASE_TABLET | Freq: Two times a day (BID) | ORAL | Status: AC
  Administered 2015-06-16 – 2015-06-17 (×3): 40 meq via ORAL
  Filled 2015-06-16 (×3): qty 2

## 2015-06-16 MED ORDER — IPRATROPIUM BROMIDE 0.02 % IN SOLN
0.5000 mg | Freq: Four times a day (QID) | RESPIRATORY_TRACT | Status: DC
  Administered 2015-06-16 – 2015-06-19 (×10): 0.5 mg via RESPIRATORY_TRACT
  Filled 2015-06-16 (×10): qty 2.5

## 2015-06-16 MED ORDER — DILTIAZEM HCL ER COATED BEADS 240 MG PO CP24
240.0000 mg | ORAL_CAPSULE | Freq: Every day | ORAL | Status: DC
  Administered 2015-06-17 – 2015-06-25 (×9): 240 mg via ORAL
  Filled 2015-06-16 (×9): qty 1

## 2015-06-16 MED ORDER — INSULIN GLARGINE 100 UNIT/ML ~~LOC~~ SOLN
10.0000 [IU] | Freq: Every day | SUBCUTANEOUS | Status: DC
  Administered 2015-06-16 – 2015-06-18 (×3): 10 [IU] via SUBCUTANEOUS
  Filled 2015-06-16 (×5): qty 0.1

## 2015-06-16 MED ORDER — INSULIN ASPART 100 UNIT/ML ~~LOC~~ SOLN
0.0000 [IU] | Freq: Every day | SUBCUTANEOUS | Status: DC
  Administered 2015-06-16: 4 [IU] via SUBCUTANEOUS
  Administered 2015-06-17: 3 [IU] via SUBCUTANEOUS
  Administered 2015-06-18: 4 [IU] via SUBCUTANEOUS

## 2015-06-16 MED ORDER — IPRATROPIUM BROMIDE 0.02 % IN SOLN: 0.5000 mg | Freq: Four times a day (QID) | RESPIRATORY_TRACT | Status: DC

## 2015-06-16 MED ORDER — FUROSEMIDE 10 MG/ML IJ SOLN
40.0000 mg | Freq: Two times a day (BID) | INTRAMUSCULAR | Status: AC
  Administered 2015-06-16 – 2015-06-17 (×3): 40 mg via INTRAVENOUS
  Filled 2015-06-16 (×3): qty 4

## 2015-06-16 MED ORDER — METHYLPREDNISOLONE SODIUM SUCC 40 MG IJ SOLR
40.0000 mg | Freq: Two times a day (BID) | INTRAMUSCULAR | Status: AC
  Administered 2015-06-16 – 2015-06-18 (×5): 40 mg via INTRAVENOUS
  Filled 2015-06-16 (×6): qty 1

## 2015-06-16 MED ORDER — INSULIN ASPART 100 UNIT/ML ~~LOC~~ SOLN
0.0000 [IU] | Freq: Three times a day (TID) | SUBCUTANEOUS | Status: DC
  Administered 2015-06-16: 8 [IU] via SUBCUTANEOUS
  Administered 2015-06-17: 5 [IU] via SUBCUTANEOUS
  Administered 2015-06-17 (×2): 8 [IU] via SUBCUTANEOUS
  Administered 2015-06-18: 11 [IU] via SUBCUTANEOUS
  Administered 2015-06-18: 8 [IU] via SUBCUTANEOUS
  Administered 2015-06-18: 15 [IU] via SUBCUTANEOUS
  Administered 2015-06-19: 11 [IU] via SUBCUTANEOUS

## 2015-06-16 MED ORDER — LEVALBUTEROL HCL 0.63 MG/3ML IN NEBU
0.6300 mg | INHALATION_SOLUTION | Freq: Four times a day (QID) | RESPIRATORY_TRACT | Status: DC
  Administered 2015-06-16 – 2015-06-19 (×10): 0.63 mg via RESPIRATORY_TRACT
  Filled 2015-06-16 (×10): qty 3

## 2015-06-16 MED ORDER — DILTIAZEM HCL ER COATED BEADS 240 MG PO CP24: 240.0000 mg | ORAL_CAPSULE | Freq: Every day | ORAL | Status: DC

## 2015-06-16 NOTE — Progress Notes (Signed)
  Echocardiogram 2D Echocardiogram has been performed.  Justin Garner 06/16/2015, 10:37 AM

## 2015-06-16 NOTE — Care Management Important Message (Signed)
Important Message  Patient Details  Name: Justin Garner MRN: [id number] Date of Birth: 1932-11-23   Medicare Important Message Given:  Yes    Keenen Roessner T, RN 06/16/2015, 12:04 PM

## 2015-06-16 NOTE — Progress Notes (Signed)
CRITICAL VALUE ALERT  Critical value received:  INR 5.57  Date of notification:  06/16/2015  Time of notification:  0341  Critical value read back: yes  Nurse who received alert:  Awilda Metro RN  MD notified (1st page):  [facility] on call  Time of first page:  [phone]  MD notified (2nd page):n/a  Time of second page:n/a  Responding MD:   Time MD responded:

## 2015-06-16 NOTE — Progress Notes (Signed)
[facility] TEAM 1 - Stepdown/ICU TEAM PROGRESS NOTE  Justin Garner LGX:[id number] DOB: [birth year] DOA: 06/13/2015 PCP: Kandice Hams, MD  Admit HPI / Brief Narrative: 82 y.o. male with Hx of hypertension, hyperlipidemia, COPD, GERD, hypothyroidism, CAD, pulmonary embolism, PAF on Coumadin, systolic congestive heart failure with EF 45%, chronic kidney disease stage II, right upper lobe lung cancer (S/P surgery, radiation therapy and chemotherapy, followed up with oncologist for 5 years, currently not seeing oncologist), who presented with productive cough, fever and shortness of breath.  In the ED, patient was found to have WBC 11.7, lactate of 2.47, temperature 102.4, tachycardia, tachypnea, positive urinalysis, potassium 2.9, worsening renal function. Oxygen desaturation to 89% on room air. Chest x-ray showed chronic changes similar to that noted on the prior exam, no acute infiltrate.  HPI/Subjective: The pt looks much better.  He is breathing comfortably.  He denies cp, sob, n/v, or abdom pain.  He states he feels very weak.    Assessment/Plan:  Acute on chronic respiratory failure with hypoxia - possible Pneumonia  Chest x-ray did not show an infiltrate, but clinically there is suspicion of CAP - there could also be contributions from COPD, as well as CHF - flu panel negative - resp virus panel negative - f/u CXR noted "worsening bilateral infiltrates" - cont abx but stop azithro in setting of rising INR on coumadin    COPD  No significant wheezing appreciated on exam - changed nebs to when necessary - wean steroids rapidly    Chronic systolic CHF 2-D echo on 10/19/42 showed EF 45% - repeat TTE 3/29 noted EF 50-55% - has developed LE edema since my last exam - 89 kg is baseline weight - net +5400cc thus far - gently diurese and follow  Filed Weights   06/13/15 1900  Weight: 89.8 kg (197 lb 15.6 oz)   Chronic Atrial fibrillation with RVR CHA2DS2-VASc Score is 5 - on Coumadin at  home - HR controlled on IV cardizem - transition back to oral cardizem and follow   Hx of PE On coumadin - INR climbing - stop azithro and follow   CAD No CP - continue Lipitor  HTN BP currently well controlled  Hypokalemia K 2.9 on admission - improving - cont to replace and follow    Hypomagnesemia Corrected  DM2 A1c 6.4 on 12/29/14 - CBG now very poorly controlled on steroid - adjust tx and follow   Hematuria  UA remarkable for signif blood - unclear if this was related to foley/or attempt at foley - recheck once hydrated and more stable - UA findings not suggestive of UTI   AKI prerenal secondary to dehydration - resolved s/p volume resuscitation - follow   Hypothyroidism Continue home Synthroid  HLD Continue Lipitor  Code Status: FULL Family Communication: Spoke with wife at bedside Disposition Plan: SDU until off cardizem gtt - PT/OT - possible d/c home 48hrs   Consultants: none  Procedures: none  Antibiotics: Azithro 3/26 > 3/29 Rocephin 3/26 >  DVT prophylaxis: Coumadin per Rx  Objective: Blood pressure 128/95, pulse 77, temperature 98.3 F (36.8 C), temperature source Oral, resp. rate 22, height 6' (1.829 m), weight 89.8 kg (197 lb 15.6 oz), SpO2 98 %.  Intake/Output Summary (Last 24 hours) at 06/16/15 1550 Last data filed at 06/16/15 1300  Gross per 24 hour  Intake 633.67 ml  Output   1550 ml  Net -916.33 ml   Exam: General: No acute respiratory distress - alert  Lungs: mild diffuse  fine crackles - no wheeze  Cardiovascular: Regular rate without murmur gallop or rub  Abdomen: Nontender, nondistended, soft, bowel sounds positive, no rebound, no ascites, no appreciable mass Extremities: No significant cyanosis, or clubbing;  1+ edema bilateral lower extremities  Data Reviewed:  Basic Metabolic Panel:  Recent Labs Lab 06/13/15 1940 06/14/15 0229 06/14/15 1804 06/15/15 0519 06/15/15 0748 06/16/15 0250  NA 139  --  138 138  --  140    K 2.9*  --  3.6 4.0  --  3.4*  CL 104  --  107 107  --  104  CO2 20*  --  21* 22  --  26  GLUCOSE 132*  --  178* 152*  --  227*  BUN 29*  --  26* 24*  --  24*  CREATININE 1.56*  --  1.06 1.02  --  1.19  CALCIUM 8.4*  --  8.1* 8.4*  --  8.7*  MG  --  1.1* 1.7 1.7 1.7 2.0    CBC:  Recent Labs Lab 06/13/15 1940 06/15/15 0519 06/16/15 0250  WBC 11.7* 15.4* 13.8*  NEUTROABS 9.9*  --  12.3*  HGB 15.6 14.8 14.7  HCT 45.7 44.7 43.9  MCV 87.7 89.8 89.0  PLT 248 274 327    Liver Function Tests:  Recent Labs Lab 06/13/15 1940 06/15/15 0519 06/16/15 0250  AST '24 28 29  '$ ALT '18 21 23  '$ ALKPHOS 111 142* 139*  BILITOT 3.5* 1.9* 1.3*  PROT 6.0* 6.1* 5.9*  ALBUMIN 3.0* 2.7* 2.6*   Coags:  Recent Labs Lab 06/14/15 06/15/15 0519 06/16/15 0250  INR 2.70* 4.43* 5.57*    Recent Labs Lab 06/14/15  APTT 37    Cardiac Enzymes:  Recent Labs Lab 06/14/15 06/14/15 0229 06/14/15 1049  TROPONINI 0.03 0.04* 0.03    CBG:  Recent Labs Lab 06/15/15 1210 06/15/15 1728 06/15/15 2226 06/16/15 0816 06/16/15 1200  GLUCAP 155* 227* 329* 218* 251*    Recent Results (from the past 240 hour(s))  Urine culture     Status: None   Collection Time: 06/13/15  7:21 PM  Result Value Ref Range Status   Specimen Description URINE, RANDOM  Final   Special Requests NONE  Final   Culture NO GROWTH 1 DAY  Final   Report Status 06/14/2015 FINAL  Final  Culture, blood (routine x 2)     Status: None (Preliminary result)   Collection Time: 06/13/15  7:30 PM  Result Value Ref Range Status   Specimen Description BLOOD FOREARM  Final   Special Requests BOTTLES DRAWN AEROBIC AND ANAEROBIC 5CC  Final   Culture NO GROWTH 3 DAYS  Final   Report Status PENDING  Incomplete  Culture, blood (routine x 2)     Status: None (Preliminary result)   Collection Time: 06/13/15  7:43 PM  Result Value Ref Range Status   Specimen Description BLOOD RIGHT ANTECUBITAL  Final   Special Requests BOTTLES DRAWN  AEROBIC AND ANAEROBIC 5CC  Final   Culture NO GROWTH 3 DAYS  Final   Report Status PENDING  Incomplete  Respiratory virus panel     Status: None   Collection Time: 06/14/15 12:42 AM  Result Value Ref Range Status   Source - RVPAN NASAL SWAB  Corrected   Respiratory Syncytial Virus A Negative Negative Final   Respiratory Syncytial Virus B Negative Negative Final   Influenza A Negative Negative Final   Influenza B Negative Negative Final   Parainfluenza 1 Negative Negative  Final   Parainfluenza 2 Negative Negative Final   Parainfluenza 3 Negative Negative Final   Metapneumovirus Negative Negative Final   Rhinovirus Negative Negative Final   Adenovirus Negative Negative Final    Comment: (NOTE) Performed At: [facility] [place], [place] [id number] Lindon Romp MD [id number]   MRSA PCR Screening     Status: None   Collection Time: 06/14/15 12:52 AM  Result Value Ref Range Status   MRSA by PCR NEGATIVE NEGATIVE Final    Comment:        The GeneXpert MRSA Assay (FDA approved for NASAL specimens only), is one component of a comprehensive MRSA colonization surveillance program. It is not intended to diagnose MRSA infection nor to guide or monitor treatment for MRSA infections.   Culture, sputum-assessment     Status: None   Collection Time: 06/14/15  4:45 AM  Result Value Ref Range Status   Specimen Description SPUTUM  Final   Special Requests NONE  Final   Sputum evaluation THIS SPECIMEN IS ACCEPTABLE FOR SPUTUM CULTURE  Final   Report Status 06/14/2015 FINAL  Final  Culture, respiratory (NON-Expectorated)     Status: None   Collection Time: 06/14/15  4:45 AM  Result Value Ref Range Status   Specimen Description SPUTUM  Final   Special Requests NONE  Final   Gram Stain   Final    ABUNDANT WBC PRESENT,BOTH PMN AND MONONUCLEAR MODERATE SQUAMOUS EPITHELIAL CELLS PRESENT ABUNDANT GRAM POSITIVE COCCI IN PAIRS IN CLUSTERS MODERATE GRAM NEGATIVE  RODS THIS SPECIMEN IS ACCEPTABLE FOR SPUTUM CULTURE Performed at [facility]    Culture   Final    NORMAL OROPHARYNGEAL FLORA Performed at [facility]    Report Status 06/16/2015 FINAL  Final     Studies:   Recent x-ray studies have been reviewed in detail by the Attending Physician  Scheduled Meds:  Scheduled Meds: . antiseptic oral rinse  7 mL Mouth Rinse BID  . atorvastatin  20 mg Oral q morning - 10a  . cefTRIAXone (ROCEPHIN)  IV  1 g Intravenous Q24H  . dextromethorphan-guaiFENesin  1 tablet Oral BID  . insulin aspart  0-5 Units Subcutaneous QHS  . insulin aspart  0-9 Units Subcutaneous TID WC  . levalbuterol  0.63 mg Nebulization Q6H  . levothyroxine  112 mcg Oral QAC breakfast  . methylPREDNISolone (SOLU-MEDROL) injection  60 mg Intravenous Q12H  . omega-3 acid ethyl esters  1 g Oral Daily  . pantoprazole  40 mg Oral Daily  . Warfarin - Pharmacist Dosing Inpatient   Does not apply q1800    Time spent on care of this patient: 35 mins   [facility] T , MD   [facility] Office  [phone] Pager - Text Page per Amion as per below:  On-Call/Text Page:      [email or web address]      password [email or web address]  If 7PM-7AM, please contact night-coverage [email or web address] Password [email or web address] 06/16/2015, 3:50 PM   LOS: 3 days

## 2015-06-16 NOTE — Progress Notes (Addendum)
Inpatient Diabetes Program Recommendations  AACE/ADA: New Consensus Statement on Inpatient Glycemic Control (2015)  Target Ranges:  Prepandial:   less than 140 mg/dL      Peak postprandial:   less than 180 mg/dL (1-2 hours)      Critically ill patients:  140 - 180 mg/dL   Results for KEIGO, WHALLEY (MRN [id number]) as of 06/16/2015 10:24  Ref. Range 06/15/2015 08:45 06/15/2015 12:10 06/15/2015 17:28 06/15/2015 22:26  Glucose-Capillary Latest Ref Range: 65-99 mg/dL 204 (H) 155 (H) 227 (H) 329 (H)   Results for DONTAVIAN, MARCHI (MRN [id number]) as of 06/16/2015 10:24  Ref. Range 06/16/2015 08:16  Glucose-Capillary Latest Ref Range: 65-99 mg/dL 218 (H)    Admit with: Pneumonia/ Sepsis  History: DM2, Lung Cancer, CKD, CHF  Home DM Meds: Glipizide 10 mg daily        Januvia 50 mg daily  Current Insulin Orders: Novolog Sensitive Correction Scale/ SSI (0-9 units) TID AC + HS      MD- Note patient started on IV Solumedrol 60 mg bid yesterday.  Having sustained glucose levels >200 mg/dl.  Please consider the following in-hospital insulin adjustments while patient getting IV steroids:  1. Start Levemir 10 units daily (0.1 units/kg dosing)  2. Increase Novolog Correction Scale/ SSI to Moderate scale (0-15 units) TID AC + HS (currently ordered as Sensitive scale)    --Will follow patient during hospitalization--  Wyn Quaker RN, MSN, CDE Diabetes Coordinator Inpatient Glycemic Control Team Team Pager: [phone] (8a-5p)

## 2015-06-16 NOTE — Progress Notes (Signed)
ANTICOAGULATION CONSULT NOTE - Initial Consult  Pharmacy Consult for Coumadin Indication: atrial fibrillation  No Known Allergies  Patient Measurements: Height: 6' (182.9 cm) Weight: 197 lb 15.6 oz (89.8 kg) (from 10/29/14) IBW/kg (Calculated) : 77.6  Vital Signs: Temp: 97.5 F (36.4 C) (03/29 0800) Temp Source: Oral (03/29 0800) BP: 129/84 mmHg (03/29 0800) Pulse Rate: 104 (03/29 0800)  Labs:  Recent Labs  06/13/15 1940 06/14/15 06/14/15 0229 06/14/15 1049 06/14/15 1804 06/15/15 0519 06/16/15 0250  HGB 15.6  --   --   --   --  14.8 14.7  HCT 45.7  --   --   --   --  44.7 43.9  PLT 248  --   --   --   --  274 327  APTT  --  37  --   --   --   --   --   LABPROT  --  28.3*  --   --   --  41.0* 48.3*  INR  --  2.70*  --   --   --  4.43* 5.57*  CREATININE 1.56*  --   --   --  1.06 1.02 1.19  TROPONINI  --  0.03 0.04* 0.03  --   --   --     Estimated Creatinine Clearance: 53.4 mL/min (by C-G formula based on Cr of 1.19).  Assessment: 82 y.o. M presents with cough, fever, and SOB  PMH: HTN, HLD, COPD, GERD, hypothyroidism, CAD, h/o PE, PAF, syst HF, CKD stage 2, h/o lung ca  Anticoagulation: Coumadin 7.'5mg'$  daily exc for '5mg'$  on Thurs PTA for afib. Admit INR therapeutic on home dose. INR this am is 5.57 (DDI with azithromycin)  Infectious Disease: abx for CAP. Afebrile, WBC 11.7.  Azithromycin 3/26 >>3/29 Ceftriaxone 3/26 >>  Resp cx 3/27 > sent Blood cx 3/26 > sent Urine cx 3/26 > sent MRSA PCR negative  Nephrology: SCr 1.19  Heme: H&H 14.7/43.9, Plt 327  Goal of Therapy:  INR 2-3 Monitor platelets by anticoagulation protocol: Yes   Plan:  Hold warfarin this evening  Ok to d/c azithromycin with increasing INR Monitor daily INR  CBC, s/s of bleed  [name], PharmD, BCPS, [facility] Clinical Pharmacist Pager [phone] 06/16/2015 10:26 AM

## 2015-06-17 DIAGNOSIS — E872 Acidosis: Secondary | ICD-10-CM

## 2015-06-17 DIAGNOSIS — R791 Abnormal coagulation profile: Secondary | ICD-10-CM

## 2015-06-17 LAB — CBC
HCT: 44.3 % (ref 39.0–52.0)
Hemoglobin: 15 g/dL (ref 13.0–17.0)
MCH: 30.1 pg (ref 26.0–34.0)
MCHC: 33.9 g/dL (ref 30.0–36.0)
MCV: 89 fL (ref 78.0–100.0)
Platelets: 378 10*3/uL (ref 150–400)
RBC: 4.98 MIL/uL (ref 4.22–5.81)
RDW: 15.4 % (ref 11.5–15.5)
WBC: 19.3 10*3/uL — ABNORMAL HIGH (ref 4.0–10.5)

## 2015-06-17 LAB — COMPREHENSIVE METABOLIC PANEL
ALBUMIN: 2.4 g/dL — AB (ref 3.5–5.0)
ALT: 28 U/L (ref 17–63)
AST: 32 U/L (ref 15–41)
Alkaline Phosphatase: 136 U/L — ABNORMAL HIGH (ref 38–126)
Anion gap: 10 (ref 5–15)
BUN: 28 mg/dL — AB (ref 6–20)
CHLORIDE: 102 mmol/L (ref 101–111)
CO2: 26 mmol/L (ref 22–32)
Calcium: 8.8 mg/dL — ABNORMAL LOW (ref 8.9–10.3)
Creatinine, Ser: 1.24 mg/dL (ref 0.61–1.24)
GFR calc Af Amer: >60 mL/min (ref >60)
GFR calc non Af Amer: 53 mL/min — ABNORMAL LOW (ref >60)
GLUCOSE: 309 mg/dL — AB (ref 65–99)
POTASSIUM: 3.7 mmol/L (ref 3.5–5.1)
Sodium: 138 mmol/L (ref 135–145)
Total Bilirubin: 1 mg/dL (ref 0.3–1.2)
Total Protein: 5.7 g/dL — ABNORMAL LOW (ref 6.5–8.1)

## 2015-06-17 LAB — PROTIME-INR
INR: 6.03 — AB (ref 0.00–1.49)
INR: 3.96 — ABNORMAL HIGH (ref 0.00–1.49)
INR: 2.19 — AB (ref 0.00–1.49)
PROTHROMBIN TIME: 51.7 s — AB (ref 11.6–15.2)
Prothrombin Time: 37.7 seconds — ABNORMAL HIGH (ref 11.6–15.2)
Prothrombin Time: 24.1 seconds — ABNORMAL HIGH (ref 11.6–15.2)

## 2015-06-17 LAB — GLUCOSE, CAPILLARY
GLUCOSE-CAPILLARY: 274 mg/dL — AB (ref 65–99)
GLUCOSE-CAPILLARY: 259 mg/dL — AB (ref 65–99)
GLUCOSE-CAPILLARY: 249 mg/dL — AB (ref 65–99)
Glucose-Capillary: 280 mg/dL — ABNORMAL HIGH (ref 65–99)
Glucose-Capillary: 328 mg/dL — ABNORMAL HIGH (ref 65–99)

## 2015-06-17 LAB — MAGNESIUM: Magnesium: 1.6 mg/dL — ABNORMAL LOW (ref 1.7–2.4)

## 2015-06-17 MED ORDER — METOPROLOL TARTRATE 12.5 MG HALF TABLET
12.5000 mg | ORAL_TABLET | Freq: Two times a day (BID) | ORAL | Status: DC
  Administered 2015-06-17 – 2015-06-25 (×17): 12.5 mg via ORAL
  Filled 2015-06-17 (×17): qty 1

## 2015-06-17 MED ORDER — VITAMIN K1 10 MG/ML IJ SOLN
3.0000 mg | Freq: Once | INTRAVENOUS | Status: AC
  Administered 2015-06-17: 3 mg via INTRAVENOUS
  Filled 2015-06-17: qty 0.3

## 2015-06-17 NOTE — Progress Notes (Signed)
CRITICAL VALUE ALERT  Critical value received:INR 6.03  Date of notification:  06/17/15 Time of notification:  0735 Critical value read back : yes Nurse who received alert: Venetia Constable MD notified (1st page):  Dr. Sherral Hammers  Time of first page:  0745  MD notified (2nd page):  Time of second page:  Responding MD:  Dr. Sherral Hammers  Time MD responded: [phone]

## 2015-06-17 NOTE — Progress Notes (Signed)
Results for NASHON, ERBES (MRN [id number]) as of 06/17/2015 13:19  Ref. Range 06/16/2015 17:40 06/16/2015 20:19 06/16/2015 22:29 06/17/2015 00:04 06/17/2015 08:13  Glucose-Capillary Latest Ref Range: 65-99 mg/dL 291 (H) 304 (H) 333 (H) 328 (H) 274 (H)  Noted that blood sugars continue to be greater than 200 mg/dl.  Recommend increasing Lantus to 15 units daily and continuing Novolog MODERATE correction scale TID & HS.  If CBGs continue to be elevated, may need to add Novolog 3 units TID with meals if eating at least 50% of meal.  Will continue to monitor blood sugars while in the hospital. Harvel Ricks RN BSN CDE

## 2015-06-17 NOTE — Progress Notes (Signed)
[place] TEAM 1 - Stepdown/ICU TEAM Progress Note  Justin Garner BTD:[id number] DOB: 22-Mar-1932 DOA: 06/13/2015 PCP: Kandice Hams, MD  Admit HPI / Brief Narrative: 82 y.o. WM PMHx HTN, Chronic Systolic CHF with EF 10%, Paroxysmal Atrial Fibrillation on Coumadin, CAD native artery, HLD, COPD, Hx RUL Lung cancer (S/P surgery, radiation therapy and chemotherapy, followed up with oncologist for 5 years(currently not seeing oncologist), Hypothyroidism, CKDstage II, ,   Presents with productive cough, fever and shortness of breath. Patient reports that he has been having cough, fever, shortness of breath in the past several days, which has been progressively getting worse. He coughs up yellow colored sputum. No chest pain. Patient does not have nausea, vomiting, abdominal pain or unilateral weakness. He has positive urinalysis for UTI, but denies any symptoms.  In ED, patient was found to have WBC 11.7, lactate of 2.47, temperature 102.4, tachycardia, tachypnea, positive urinalysis for UTI, potassium 2.9, worsening renal function. Oxygen desaturation to 89% on room air. Chest x-ray showed chronic changes similar to that noted on the prior exam, no acute infiltrate is seen. Patients and admitted to inpatient for further evaluation and treatment.   EKG: Independently reviewed. QTC 426, atrial fibrillation with RVR, occasional PVC, RAD  HPI/Subjective: 3/ 30  A/O 4, NAD, Afebrile overnight. Wife concerned that yesterday patient had some confusion. However currently answers all questions appropriately.  Positive productive sputum (thick green/yellow)  Assessment/Plan: Sepsis/Acute on chronic respiratory failure with hypoxia/ CAP positive GPC/GNR  -On admission medical care for sepsis RR> 20, temp> 38C, HR> 90 -3/28 PCXR consistent with pneumonia/fluid overload see results below  - flu panel negative - resp virus panel negative   - Solu-Medrol 40 mg BID -Physiotherapy vest BID -Mucinex DM  BID -Xopenex nebulizer QID -Atrovent nebulizer QID -Azithromycin DC'd secondary to increasing INR, Rocephin continued  Respiratory acidosis -See sepsis  COPD  -Wheezing this a.m. Xopenex QID  Right pleural effusion/Pulmonary Edema -Lasix 80 mg 1  Chronic systolic CHF(Baseline weight= 89 kg)/Cardiomyopathy -2-D echo on 07/18/13 showed EF 45% - no edema or JVD -Echocardiogram; mild improvement in EF 50%; however cardiomyopathy see results below -Strict in and out since admission +2.9 L -Daily weight  Chronic Atrial fibrillation with RVR -CHA2DS2-VASc Score is 5 - on Coumadin at home  -uncontrolled HR on PO medication x~9 hours -Start Metoprolol 12.5 mg BID -Cardizem 240 mg daily would not use amiodarone secondary to patient's COPD, Hx RUL Lung Ca S/P XRT+ chemotherapy. -Coumadin held, currently supratherapeutic  CAD -Lipitor 20 g daily  -Lipid panel pending   HTN BP currently well controlled  Hx of PE -See chronic atrial fibrillation with RVR   Supratherapeutic INR -Patient's INR has been climbing over the last 48 hours now at 6 -Discontinue Coumadin -Vitamin K 3 mg; trend INR  Hypokalemia -Potassium goal> 4 -WNL   Hypomagnesemia -Magnesium goal >2 -Magnesium IV 2 gm    DM2 Type 2 with complications  -62/69/4854 hemoglobin A1c =6.4   - CBG currently well-controlled  Hematuria  -UA remarkable for signif blood - unclear is this was related to foley/or attempt at foley - recheck once hydrated and more stable - UA findings not suggestive of UTI   AKI -Resolved  -Continue to hold nephrotoxic medication  -Continue to hold ACEI, ARB, NSAIDs    Hypothyroidism Continue home Synthroid 112 g daily  HLD Continue Lipitor 20 mg daily  Goals of care -Explained in detail the difference between DO NOT RESUSCITATE and full code at the completion  of our discussion patient elected DO NOT RESUSCITATE   Code Status: DO NOT RESUSCITATE Family Communication: Wife  present at time of exam Disposition Plan: Resolution sepsis    Consultants: NA  Procedure/Significant Events: 3/28 PCXR;Worsening of bilateral interstitial infiltrates has occurred. -A small right pleural effusion 3/29 echocardiogram;LVEF= 50% to 55%. -Left and Right atrium:  moderately dilated.     Culture 3/26 urine negative final 3/26 blood forearm, right forearm NGTD 3/27 MRSA by PCR negative 3/27 sputum positive GPC/GNR 3/27 influenza A/B/H1N1 negative 3/27 respiratory virus panel negative 3/28 strep pneumo urine antigen/Legionella urine antigen pending   Antibiotics: Azithromycin 3/26>> 3/29 Rocephin 3/26>>  DVT prophylaxis: Coumadin per pharmacy   Devices    LINES / TUBES:      Continuous Infusions:    Objective: VITAL SIGNS: Temp: 97.9 F (36.6 C) (04/02 6803) Temp Source: Oral (04/02 2122) BP: 114/81 mmHg (04/02 4825) Pulse Rate: 71 (04/02 0638) SPO2; FIO2:   Intake/Output Summary (Last 24 hours) at 06/20/15 0744 Last data filed at 06/20/15 0037  Gross per 24 hour  Intake    120 ml  Output   1525 ml  Net  -1405 ml     Exam: General: A/O 4, positive acute respiratory distress Eyes: Negative headache, negative scleral hemorrhage ENT: Negative Runny nose, negative gingival bleeding, Neck:  Negative scars, masses, torticollis, lymphadenopathy, JVD Lungs: tachypnea, diffuse rhonchi (improved), positive expiratory wheezing diffusely, negative crackles, productive sputum thick green/yellow Cardiovascular: Irregular irregular rhythm and rate, without murmur gallop or rub normal S1 and S2 Abdomen:negative abdominal pain, nondistended, positive soft, bowel sounds, no rebound, no ascites, no appreciable mass Extremities: No significant cyanosis, clubbing, or edema bilateral lower extremities Psychiatric:  Negative depression, negative anxiety, negative fatigue, negative mania  Neurologic:  Cranial nerves II through XII intact, tongue/uvula  midline, all extremities muscle strength 5/5, sensation intact throughout, negative dysarthria, negative expressive aphasia, negative receptive aphasia.   Data Reviewed: Basic Metabolic Panel:  Recent Labs Lab 06/15/15 0748 06/16/15 0250 06/17/15 0439 06/18/15 0742 06/19/15 0235 06/20/15 0227  NA  --  140 138 134* 134* 135  K  --  3.4* 3.7 4.4 4.5 4.5  CL  --  104 102 96* 96* 98*  CO2  --  '26 26 29 27 29  '$ GLUCOSE  --  227* 309* 302* 340* 247*  BUN  --  24* 28* 36* 45* 42*  CREATININE  --  1.19 1.24 1.30* 1.21 1.20  CALCIUM  --  8.7* 8.8* 8.4* 8.5* 8.3*  MG 1.7 2.0 1.6* 1.5* 1.9  --   PHOS  --   --   --   --  3.4  --    Liver Function Tests:  Recent Labs Lab 06/13/15 1940 06/15/15 0519 06/16/15 0250 06/17/15 0439 06/18/15 0742 06/19/15 0235  AST '24 28 29 '$ 32 35  --   ALT '18 21 23 28 '$ 38  --   ALKPHOS 111 142* 139* 136* 131*  --   BILITOT 3.5* 1.9* 1.3* 1.0 1.4*  --   PROT 6.0* 6.1* 5.9* 5.7* 5.4*  --   ALBUMIN 3.0* 2.7* 2.6* 2.4* 2.4* 2.4*   No results for input(s): LIPASE, AMYLASE in the last 168 hours. No results for input(s): AMMONIA in the last 168 hours. CBC:  Recent Labs Lab 06/13/15 1940 06/15/15 0519 06/16/15 0250 06/17/15 0439 06/18/15 0742 06/20/15 0227  WBC 11.7* 15.4* 13.8* 19.3* 21.0* 24.2*  NEUTROABS 9.9*  --  12.3*  --  18.8* 21.2*  HGB 15.6  14.8 14.7 15.0 15.8 16.0  HCT 45.7 44.7 43.9 44.3 45.4 47.5  MCV 87.7 89.8 89.0 89.0 88.0 90.0  PLT 248 274 327 378 424* 424*   Cardiac Enzymes:  Recent Labs Lab 06/14/15 06/14/15 0229 06/14/15 1049  TROPONINI 0.03 0.04* 0.03   BNP (last 3 results)  Recent Labs  06/14/15 0306  BNP 189.0*    ProBNP (last 3 results) No results for input(s): PROBNP in the last 8760 hours.  CBG:  Recent Labs Lab 06/19/15 0628 06/19/15 1122 06/19/15 1615 06/19/15 2136 06/20/15 0620  GLUCAP 319* 254* 274* 196* 222*    Recent Results (from the past 240 hour(s))  Urine culture     Status: None    Collection Time: 06/13/15  7:21 PM  Result Value Ref Range Status   Specimen Description URINE, RANDOM  Final   Special Requests NONE  Final   Culture NO GROWTH 1 DAY  Final   Report Status 06/14/2015 FINAL  Final  Culture, blood (routine x 2)     Status: None   Collection Time: 06/13/15  7:30 PM  Result Value Ref Range Status   Specimen Description BLOOD FOREARM  Final   Special Requests BOTTLES DRAWN AEROBIC AND ANAEROBIC 5CC  Final   Culture NO GROWTH 5 DAYS  Final   Report Status 06/18/2015 FINAL  Final  Culture, blood (routine x 2)     Status: None   Collection Time: 06/13/15  7:43 PM  Result Value Ref Range Status   Specimen Description BLOOD RIGHT ANTECUBITAL  Final   Special Requests BOTTLES DRAWN AEROBIC AND ANAEROBIC 5CC  Final   Culture NO GROWTH 5 DAYS  Final   Report Status 06/18/2015 FINAL  Final  Respiratory virus panel     Status: None   Collection Time: 06/14/15 12:42 AM  Result Value Ref Range Status   Source - RVPAN NASAL SWAB  Corrected   Respiratory Syncytial Virus A Negative Negative Final   Respiratory Syncytial Virus B Negative Negative Final   Influenza A Negative Negative Final   Influenza B Negative Negative Final   Parainfluenza 1 Negative Negative Final   Parainfluenza 2 Negative Negative Final   Parainfluenza 3 Negative Negative Final   Metapneumovirus Negative Negative Final   Rhinovirus Negative Negative Final   Adenovirus Negative Negative Final    Comment: (NOTE) Performed At: [facility] [address] [place], [place] [id number] Lindon Romp MD ZD:[id number]   MRSA PCR Screening     Status: None   Collection Time: 06/14/15 12:52 AM  Result Value Ref Range Status   MRSA by PCR NEGATIVE NEGATIVE Final    Comment:        The GeneXpert MRSA Assay (FDA approved for NASAL specimens only), is one component of a comprehensive MRSA colonization surveillance program. It is not intended to diagnose MRSA infection nor to guide  or monitor treatment for MRSA infections.   Culture, sputum-assessment     Status: None   Collection Time: 06/14/15  4:45 AM  Result Value Ref Range Status   Specimen Description SPUTUM  Final   Special Requests NONE  Final   Sputum evaluation THIS SPECIMEN IS ACCEPTABLE FOR SPUTUM CULTURE  Final   Report Status 06/14/2015 FINAL  Final  Culture, respiratory (NON-Expectorated)     Status: None   Collection Time: 06/14/15  4:45 AM  Result Value Ref Range Status   Specimen Description SPUTUM  Final   Special Requests NONE  Final   Gram  Stain   Final    ABUNDANT WBC PRESENT,BOTH PMN AND MONONUCLEAR MODERATE SQUAMOUS EPITHELIAL CELLS PRESENT ABUNDANT GRAM POSITIVE COCCI IN PAIRS IN CLUSTERS MODERATE GRAM NEGATIVE RODS THIS SPECIMEN IS ACCEPTABLE FOR SPUTUM CULTURE Performed at [facility]    Culture   Final    NORMAL OROPHARYNGEAL FLORA Performed at [facility]    Report Status 06/16/2015 FINAL  Final     Studies:  Recent x-ray studies have been reviewed in detail by the Attending Physician  Scheduled Meds:  Scheduled Meds: . antiseptic oral rinse  7 mL Mouth Rinse BID  . atorvastatin  20 mg Oral q morning - 10a  . cefTRIAXone (ROCEPHIN)  IV  1 g Intravenous Q24H  . dextromethorphan-guaiFENesin  1 tablet Oral BID  . diltiazem  240 mg Oral Daily  . insulin aspart  0-15 Units Subcutaneous TID WC  . insulin aspart  0-5 Units Subcutaneous QHS  . insulin aspart  4 Units Subcutaneous TID WC  . insulin glargine  15 Units Subcutaneous QHS  . levothyroxine  112 mcg Oral QAC breakfast  . metoprolol tartrate  12.5 mg Oral BID  . omega-3 acid ethyl esters  1 g Oral Daily  . pantoprazole  40 mg Oral Daily  . Warfarin - Pharmacist Dosing Inpatient   Does not apply q1800    Time spent on care of this patient: 40 mins   WOODS, Geraldo Docker , MD  [facility] Office  [phone] Pager - [phone]  On-Call/Text Page:      [email or web address]      password  [email or web address]  If 7PM-7AM, please contact night-coverage [email or web address] Password [email or web address] 06/20/2015, 7:44 AM   LOS: 7 days   Care during the described time interval was provided by me .  I have reviewed this patient's available data, including medical history, events of note, physical examination, and all test results as part of my evaluation. I have personally reviewed and interpreted all radiology studies.   Dia Crawford, MD [phone] Pager

## 2015-06-17 NOTE — Progress Notes (Signed)
PT Cancellation Note  Patient Details Name: Justin Garner MRN: [id number] DOB: Jan 15, 1933   Cancelled Treatment:    Reason Eval/Treat Not Completed: Medical issues which prohibited therapy. Pt's INR 6.03, Nursing advised to hold for today due to safety concerns. Will follow up tomorrow.   Colon Branch, SPT Colon Branch 06/17/2015, 9:08 AM

## 2015-06-18 DIAGNOSIS — I482 Chronic atrial fibrillation, unspecified: Secondary | ICD-10-CM | POA: Diagnosis present

## 2015-06-18 DIAGNOSIS — E118 Type 2 diabetes mellitus with unspecified complications: Secondary | ICD-10-CM | POA: Diagnosis present

## 2015-06-18 DIAGNOSIS — I429 Cardiomyopathy, unspecified: Secondary | ICD-10-CM | POA: Diagnosis present

## 2015-06-18 DIAGNOSIS — J9601 Acute respiratory failure with hypoxia: Secondary | ICD-10-CM | POA: Diagnosis present

## 2015-06-18 DIAGNOSIS — R791 Abnormal coagulation profile: Secondary | ICD-10-CM | POA: Insufficient documentation

## 2015-06-18 LAB — CBC WITH DIFFERENTIAL/PLATELET
BASOS ABS: 0 10*3/uL (ref 0.0–0.1)
BASOS PCT: 0 %
Eosinophils Absolute: 0 10*3/uL (ref 0.0–0.7)
Eosinophils Relative: 0 %
HEMATOCRIT: 45.4 % (ref 39.0–52.0)
Hemoglobin: 15.8 g/dL (ref 13.0–17.0)
Lymphocytes Relative: 5 %
Lymphs Abs: 1 10*3/uL (ref 0.7–4.0)
MCH: 30.6 pg (ref 26.0–34.0)
MCHC: 34.8 g/dL (ref 30.0–36.0)
MCV: 88 fL (ref 78.0–100.0)
MONO ABS: 1.2 10*3/uL — AB (ref 0.1–1.0)
Monocytes Relative: 6 %
NEUTROS ABS: 18.8 10*3/uL — AB (ref 1.7–7.7)
NEUTROS PCT: 89 %
Platelets: 424 10*3/uL — ABNORMAL HIGH (ref 150–400)
RBC: 5.16 MIL/uL (ref 4.22–5.81)
RDW: 15.2 % (ref 11.5–15.5)
WBC: 21 10*3/uL — AB (ref 4.0–10.5)

## 2015-06-18 LAB — COMPREHENSIVE METABOLIC PANEL
ALK PHOS: 131 U/L — AB (ref 38–126)
ALT: 38 U/L (ref 17–63)
ANION GAP: 9 (ref 5–15)
AST: 35 U/L (ref 15–41)
Albumin: 2.4 g/dL — ABNORMAL LOW (ref 3.5–5.0)
BILIRUBIN TOTAL: 1.4 mg/dL — AB (ref 0.3–1.2)
BUN: 36 mg/dL — ABNORMAL HIGH (ref 6–20)
CALCIUM: 8.4 mg/dL — AB (ref 8.9–10.3)
CO2: 29 mmol/L (ref 22–32)
Chloride: 96 mmol/L — ABNORMAL LOW (ref 101–111)
Creatinine, Ser: 1.3 mg/dL — ABNORMAL HIGH (ref 0.61–1.24)
GFR, EST AFRICAN AMERICAN: 58 mL/min — AB (ref >60)
GFR, EST NON AFRICAN AMERICAN: 50 mL/min — AB (ref >60)
GLUCOSE: 302 mg/dL — AB (ref 65–99)
POTASSIUM: 4.4 mmol/L (ref 3.5–5.1)
Sodium: 134 mmol/L — ABNORMAL LOW (ref 135–145)
Total Protein: 5.4 g/dL — ABNORMAL LOW (ref 6.5–8.1)

## 2015-06-18 LAB — PROTIME-INR
INR: 1.69 — ABNORMAL HIGH (ref 0.00–1.49)
PROTHROMBIN TIME: 19.9 s — AB (ref 11.6–15.2)

## 2015-06-18 LAB — CULTURE, BLOOD (ROUTINE X 2)
CULTURE: NO GROWTH
Culture: NO GROWTH

## 2015-06-18 LAB — GLUCOSE, CAPILLARY
GLUCOSE-CAPILLARY: 296 mg/dL — AB (ref 65–99)
GLUCOSE-CAPILLARY: 332 mg/dL — AB (ref 65–99)
GLUCOSE-CAPILLARY: 352 mg/dL — AB (ref 65–99)
Glucose-Capillary: 315 mg/dL — ABNORMAL HIGH (ref 65–99)

## 2015-06-18 LAB — MAGNESIUM: Magnesium: 1.5 mg/dL — ABNORMAL LOW (ref 1.7–2.4)

## 2015-06-18 MED ORDER — MAGNESIUM SULFATE 2 GM/50ML IV SOLN
2.0000 g | Freq: Once | INTRAVENOUS | Status: AC
  Administered 2015-06-18: 2 g via INTRAVENOUS
  Filled 2015-06-18: qty 50

## 2015-06-18 MED ORDER — WARFARIN - PHARMACIST DOSING INPATIENT
Freq: Every day | Status: DC
  Administered 2015-06-21 – 2015-06-24 (×4)

## 2015-06-18 MED ORDER — WARFARIN SODIUM 5 MG PO TABS
7.5000 mg | ORAL_TABLET | Freq: Once | ORAL | Status: AC
  Administered 2015-06-18: 7.5 mg via ORAL
  Filled 2015-06-18: qty 1.5

## 2015-06-18 NOTE — Progress Notes (Signed)
ANTICOAGULATION CONSULT NOTE  Pharmacy Consult for Coumadin Indication: atrial fibrillation  No Known Allergies  Patient Measurements: Height: 6' (182.9 cm) Weight: 191 lb 2.2 oz (86.7 kg) IBW/kg (Calculated) : 77.6  Vital Signs: Temp: 97.9 F (36.6 C) (03/31 0400) Temp Source: Oral (03/31 0400) BP: 93/81 mmHg (03/31 1005) Pulse Rate: 125 (03/31 1005)  Labs:  Recent Labs  06/16/15 0250 06/17/15 0439 06/17/15 1212 06/17/15 2001 06/18/15 0308 06/18/15 0742  HGB 14.7 15.0  --   --   --  15.8  HCT 43.9 44.3  --   --   --  45.4  PLT 327 378  --   --   --  424*  LABPROT 48.3* 51.7* 37.7* 24.1* 19.9*  --   INR 5.57* 6.03* 3.96* 2.19* 1.69*  --   CREATININE 1.19 1.24  --   --   --  1.30*    Estimated Creatinine Clearance: 48.9 mL/min (by C-G formula based on Cr of 1.3).  Assessment: 82 y.o. M presents with cough, fever, and SOB.  On coumadin PTA for afib.   Home regimen: Coumadin 7.'5mg'$  daily exc for '5mg'$  on Thurs PTA for afib. Admit INR therapeutic on home dose.   INR up to 6.03 on 3/30 possibly due to DI with azithromycin.  MD gave vitamin K 3 mg IV 3/30 at 09am and now INR down to 1.69.  CBC WNL.  No bleeding reported.   Goal of Therapy:  INR 2-3 Monitor platelets by anticoagulation protocol: Yes   Plan:  Coumadin 7.5 mg po x 1 dose Daily INR F/u for S/sx bleeding  Eudelia Bunch, Pharm.D. [phone] 06/18/2015 11:46 AM

## 2015-06-18 NOTE — Progress Notes (Signed)
[place] TEAM 1 - Stepdown/ICU TEAM PROGRESS NOTE  Justin Garner ZOX:[id number] DOB: 07/03/1932 DOA: 06/13/2015 PCP: Kandice Hams, MD  Admit HPI / Brief Narrative: 82 y.o. male with Hx of hypertension, hyperlipidemia, COPD, GERD, hypothyroidism, CAD, pulmonary embolism, PAF on Coumadin, systolic congestive heart failure with EF 45%, chronic kidney disease stage II, right upper lobe lung cancer (S/P surgery, radiation therapy and chemotherapy, followed up with oncologist for 5 years, currently not seeing oncologist), who presented with productive cough, fever and shortness of breath.  In the ED, patient was found to have WBC 11.7, lactate of 2.47, temperature 102.4, tachycardia, tachypnea, positive urinalysis, potassium 2.9, worsening renal function. Oxygen desaturation to 89% on room air. Chest x-ray showed chronic changes similar to that noted on the prior exam, no acute infiltrate.  HPI/Subjective: The pt is breathing comfortably.  He denies cp, sob, n/v, or abdom pain.  He states he feels very weak and unsteady on his feet.    Assessment/Plan:  Acute on chronic respiratory failure with hypoxia - possible Pneumonia  Chest x-ray did not show an infiltrate, but clinically there is suspicion of CAP - there could also be contributions from COPD, as well as CHF - flu panel negative - resp virus panel negative - f/u CXR noted "worsening bilateral infiltrates" but pt is clinically improving - cont abx to complete a 7 day course   COPD  No significant wheezing on exam - changed nebs to when necessary - stop steroids after last dose today   Chronic systolic CHF 2-D echo on 10/18/17 showed EF 45% - repeat TTE 3/29 noted EF 50-55% - LE edema improving - 89 kg is baseline weight - net +450cc still - hold diuresis w/ climbing crt   Filed Weights   06/13/15 1900 06/17/15 0500 06/18/15 0608  Weight: 89.8 kg (197 lb 15.6 oz) 88.2 kg (194 lb 7.1 oz) 86.7 kg (191 lb 2.2 oz)   Chronic Atrial  fibrillation with RVR CHA2DS2-VASc Score is 5 - on Coumadin at home - HR controlled on cardizem - resuming coumadin now off azithromycin  Hx of PE On coumadin per Pharmacy   CAD No CP - continue Lipitor  HTN BP currently well controlled  Hypokalemia Corrected with replacement  Hypomagnesemia Trending back down - correct further and follow   DM2 A1c 6.4 on 12/29/14 - CBG now very poorly controlled on steroids - will not adjust insulin for now as steroid is being stopped today   Hematuria  UA remarkable for signif blood - unclear if this was related to foley/or attempt at foley - recheck UA today  AKI prerenal secondary to dehydration - resolved s/p volume resuscitation but recurring w/ diuresis - hold further lasix dosing   Hypothyroidism Continue home Synthroid  HLD Continue Lipitor  Code Status: FULL Family Communication: Spoke with wife at bedside Disposition Plan: PT/OT - transfer to tele bed - watch renal fxn - ?home 48-72hrs   Consultants: none  Procedures: none  Antibiotics: Azithro 3/26 > 3/29 Rocephin 3/26 >  DVT prophylaxis: Coumadin per Rx  Objective: Blood pressure 93/81, pulse 125, temperature 97.9 F (36.6 C), temperature source Oral, resp. rate 24, height 6' (1.829 m), weight 86.7 kg (191 lb 2.2 oz), SpO2 95 %.  Intake/Output Summary (Last 24 hours) at 06/18/15 1552 Last data filed at 06/18/15 0100  Gross per 24 hour  Intake      0 ml  Output   3000 ml  Net  -3000 ml   Exam:  General: No acute respiratory distress - alert and conversant  Lungs: CTA th/o - no wheeze  Cardiovascular: Regular rate without murmur gallop or rub  Abdomen: Nontender, nondistended, soft, bowel sounds positive, no rebound, no ascites, no appreciable mass Extremities: No significant cyanosis, or clubbing; trace edema bilateral lower extremities  Data Reviewed:  Basic Metabolic Panel:  Recent Labs Lab 06/14/15 1804 06/15/15 0519 06/15/15 0748  06/16/15 0250 06/17/15 0439 06/18/15 0742  NA 138 138  --  140 138 134*  K 3.6 4.0  --  3.4* 3.7 4.4  CL 107 107  --  104 102 96*  CO2 21* 22  --  '26 26 29  '$ GLUCOSE 178* 152*  --  227* 309* 302*  BUN 26* 24*  --  24* 28* 36*  CREATININE 1.06 1.02  --  1.19 1.24 1.30*  CALCIUM 8.1* 8.4*  --  8.7* 8.8* 8.4*  MG 1.7 1.7 1.7 2.0 1.6* 1.5*    CBC:  Recent Labs Lab 06/13/15 1940 06/15/15 0519 06/16/15 0250 06/17/15 0439 06/18/15 0742  WBC 11.7* 15.4* 13.8* 19.3* 21.0*  NEUTROABS 9.9*  --  12.3*  --  18.8*  HGB 15.6 14.8 14.7 15.0 15.8  HCT 45.7 44.7 43.9 44.3 45.4  MCV 87.7 89.8 89.0 89.0 88.0  PLT 248 274 327 378 424*    Liver Function Tests:  Recent Labs Lab 06/13/15 1940 06/15/15 0519 06/16/15 0250 06/17/15 0439 06/18/15 0742  AST '24 28 29 '$ 32 35  ALT '18 21 23 28 '$ 38  ALKPHOS 111 142* 139* 136* 131*  BILITOT 3.5* 1.9* 1.3* 1.0 1.4*  PROT 6.0* 6.1* 5.9* 5.7* 5.4*  ALBUMIN 3.0* 2.7* 2.6* 2.4* 2.4*   Coags:  Recent Labs Lab 06/16/15 0250 06/17/15 0439 06/17/15 1212 06/17/15 2001 06/18/15 0308  INR 5.57* 6.03* 3.96* 2.19* 1.69*    Recent Labs Lab 06/14/15  APTT 37    Cardiac Enzymes:  Recent Labs Lab 06/14/15 06/14/15 0229 06/14/15 1049  TROPONINI 0.03 0.04* 0.03    CBG:  Recent Labs Lab 06/17/15 1227 06/17/15 1628 06/17/15 2119 06/18/15 0818 06/18/15 1228  GLUCAP 259* 249* 280* 296* 332*    Recent Results (from the past 240 hour(s))  Urine culture     Status: None   Collection Time: 06/13/15  7:21 PM  Result Value Ref Range Status   Specimen Description URINE, RANDOM  Final   Special Requests NONE  Final   Culture NO GROWTH 1 DAY  Final   Report Status 06/14/2015 FINAL  Final  Culture, blood (routine x 2)     Status: None   Collection Time: 06/13/15  7:30 PM  Result Value Ref Range Status   Specimen Description BLOOD FOREARM  Final   Special Requests BOTTLES DRAWN AEROBIC AND ANAEROBIC 5CC  Final   Culture NO GROWTH 5 DAYS   Final   Report Status 06/18/2015 FINAL  Final  Culture, blood (routine x 2)     Status: None   Collection Time: 06/13/15  7:43 PM  Result Value Ref Range Status   Specimen Description BLOOD RIGHT ANTECUBITAL  Final   Special Requests BOTTLES DRAWN AEROBIC AND ANAEROBIC 5CC  Final   Culture NO GROWTH 5 DAYS  Final   Report Status 06/18/2015 FINAL  Final  Respiratory virus panel     Status: None   Collection Time: 06/14/15 12:42 AM  Result Value Ref Range Status   Source - RVPAN NASAL SWAB  Corrected   Respiratory Syncytial Virus A Negative Negative  Final   Respiratory Syncytial Virus B Negative Negative Final   Influenza A Negative Negative Final   Influenza B Negative Negative Final   Parainfluenza 1 Negative Negative Final   Parainfluenza 2 Negative Negative Final   Parainfluenza 3 Negative Negative Final   Metapneumovirus Negative Negative Final   Rhinovirus Negative Negative Final   Adenovirus Negative Negative Final    Comment: (NOTE) Performed At: [facility] [place], [place] [id number] Lindon Romp MD [id number]   MRSA PCR Screening     Status: None   Collection Time: 06/14/15 12:52 AM  Result Value Ref Range Status   MRSA by PCR NEGATIVE NEGATIVE Final    Comment:        The GeneXpert MRSA Assay (FDA approved for NASAL specimens only), is one component of a comprehensive MRSA colonization surveillance program. It is not intended to diagnose MRSA infection nor to guide or monitor treatment for MRSA infections.   Culture, sputum-assessment     Status: None   Collection Time: 06/14/15  4:45 AM  Result Value Ref Range Status   Specimen Description SPUTUM  Final   Special Requests NONE  Final   Sputum evaluation THIS SPECIMEN IS ACCEPTABLE FOR SPUTUM CULTURE  Final   Report Status 06/14/2015 FINAL  Final  Culture, respiratory (NON-Expectorated)     Status: None   Collection Time: 06/14/15  4:45 AM  Result Value Ref Range Status    Specimen Description SPUTUM  Final   Special Requests NONE  Final   Gram Stain   Final    ABUNDANT WBC PRESENT,BOTH PMN AND MONONUCLEAR MODERATE SQUAMOUS EPITHELIAL CELLS PRESENT ABUNDANT GRAM POSITIVE COCCI IN PAIRS IN CLUSTERS MODERATE GRAM NEGATIVE RODS THIS SPECIMEN IS ACCEPTABLE FOR SPUTUM CULTURE Performed at [facility]    Culture   Final    NORMAL OROPHARYNGEAL FLORA Performed at [facility]    Report Status 06/16/2015 FINAL  Final     Studies:   Recent x-ray studies have been reviewed in detail by the Attending Physician  Scheduled Meds:  Scheduled Meds: . antiseptic oral rinse  7 mL Mouth Rinse BID  . atorvastatin  20 mg Oral q morning - 10a  . cefTRIAXone (ROCEPHIN)  IV  1 g Intravenous Q24H  . dextromethorphan-guaiFENesin  1 tablet Oral BID  . diltiazem  240 mg Oral Daily  . insulin aspart  0-15 Units Subcutaneous TID WC  . insulin aspart  0-5 Units Subcutaneous QHS  . insulin glargine  10 Units Subcutaneous QHS  . ipratropium  0.5 mg Nebulization QID  . levalbuterol  0.63 mg Nebulization QID  . levothyroxine  112 mcg Oral QAC breakfast  . methylPREDNISolone (SOLU-MEDROL) injection  40 mg Intravenous Q12H  . metoprolol tartrate  12.5 mg Oral BID  . omega-3 acid ethyl esters  1 g Oral Daily  . pantoprazole  40 mg Oral Daily  . warfarin  7.5 mg Oral ONCE-1800  . Warfarin - Pharmacist Dosing Inpatient   Does not apply q1800    Time spent on care of this patient: 25 mins   [facility] T , MD   [facility] Office  [phone] Pager - Text Page per Amion as per below:  On-Call/Text Page:      [email or web address]      password [email or web address]  If 7PM-7AM, please contact night-coverage [email or web address] Password [email or web address] 06/18/2015, 3:52 PM   LOS: 5 days

## 2015-06-18 NOTE — Progress Notes (Addendum)
Physical Therapy Treatment Patient Details Name: Justin Garner MRN: [id number] DOB: [birth year] Today's Date: 06/18/2015    History of Present Illness Justin Garner is a 82 y.o. male with PMH of hypertension, hyperlipidemia, COPD, GERD, hypothyroidism, CAD, pulmonary embolism, PAF on Coumadin, systolic congestive heart failure with EF 45%, chronic kidney disease-stage II, history of right upper lobe lung cancer (S/P surgery, radiation therapy and chemotherapy, followed up with oncologist for 5 years, currently not seeing oncologist), who presents with productive cough, fever and shortness of breath.    PT Comments    Patient participating in seated therex and in room ambulation this session with SpO2 dropping to 86% on 2L O2 and HR 121.  Pt. Felt he could have gone farther if had portable O2, but limited by dropping O2 sats.  Will benefit from continued skilled PT in the acute setting prior to d/c home with wife assist.  Follow Up Recommendations  Home health PT;Supervision/Assistance - 24 hour     Equipment Recommendations  Rolling walker with 5" wheels    Recommendations for Other Services       Precautions / Restrictions Precautions Precautions: Fall Precaution Comments: watch HR- tachycardiac previously during session    Mobility  Bed Mobility               General bed mobility comments: in chair on arrival  Transfers Overall transfer level: Needs assistance Equipment used: Rolling walker (2 wheeled) Transfers: Sit to/from Stand Sit to Stand: Min assist (up from low recliner)         General transfer comment: cues for hand placement, scoot to edge of chair, sat back into chair uncontrolled descent  Ambulation/Gait Ambulation/Gait assistance: Min guard;Supervision Ambulation Distance (Feet): 30 Feet Assistive device: Rolling walker (2 wheeled) Gait Pattern/deviations: Step-through pattern;Trunk flexed;Decreased stride length;Shuffle     General Gait  Details: slow shuffling gait, cues for posture, able to to maneuver walker with min A in small space   Stairs            Wheelchair Mobility    Modified Rankin (Stroke Patients Only)       Balance Overall balance assessment: Needs assistance         Standing balance support: Bilateral upper extremity supported Standing balance-Leahy Scale: Poor Standing balance comment: UE support for balance                    Cognition Arousal/Alertness: Awake/alert Behavior During Therapy: WFL for tasks assessed/performed Overall Cognitive Status: Within Functional Limits for tasks assessed                      Exercises General Exercises - Lower Extremity Long Arc Quad: AROM;Both;15 reps;Seated Hip Flexion/Marching: AROM;Both;15 reps;Seated Other Exercises Other Exercises: scapular squeezes x 5 w/5 sec hold    General Comments General comments (skin integrity, edema, etc.): on 2L O2 SpO2 drops to 86% and HR 121 with ambulation in room to door and back to chair      Pertinent Vitals/Pain Pain Assessment: No/denies pain    Home Living                      Prior Function            PT Goals (current goals can now be found in the care plan section) Progress towards PT goals: Progressing toward goals    Frequency  Min 3X/week    PT Plan Current plan remains appropriate  Co-evaluation             End of Session Equipment Utilized During Treatment: Gait belt;Oxygen Activity Tolerance: Treatment limited secondary to medical complications (Comment) (increased HR, decreased SpO2) Patient left: in chair;with chair alarm set;with call bell/phone within reach;with family/visitor present     Time: 1310-1333 PT Time Calculation (min) (ACUTE ONLY): 23 min  Charges:  $Gait Training: 8-22 mins $Therapeutic Exercise: 8-22 mins                    G Codes:      Reginia Naas 2015-06-21, 4:05 PM  Magda Kiel, [place] 2015/06/21

## 2015-06-18 NOTE — Progress Notes (Signed)
Results for SHAWNTEZ, DICKISON (MRN [id number]) as of 06/18/2015 13:29  Ref. Range 06/17/2015 12:27 06/17/2015 16:28 06/17/2015 21:19 06/18/2015 08:18 06/18/2015 12:28  Glucose-Capillary Latest Ref Range: 65-99 mg/dL 259 (H) 249 (H) 280 (H) 296 (H) 332 (H)  Noted that CBGs continue to be elevated. Recommend increasing Lantus to 15-20 units daily, continue Novolog MODERATE correction scale TID & HS. Recommend adding Novolog 3-4 units TID with meals if eating at least 50% of meal if postprandial blood sugars continue to be elevated. Will continue to monitor blood sugars while in the hospital.  Harvel Ricks RN BSN CDE

## 2015-06-18 NOTE — Progress Notes (Signed)
Occupational Therapy Treatment Patient Details Name: Justin Garner MRN: [id number] DOB: 1932-12-31 Today's Date: 06/18/2015    History of present illness Justin Garner is a 82 y.o. male with PMH of hypertension, hyperlipidemia, COPD, GERD, hypothyroidism, CAD, pulmonary embolism, PAF on Coumadin, systolic congestive heart failure with EF 45%, chronic kidney disease-stage II, history of right upper lobe lung cancer (S/P surgery, radiation therapy and chemotherapy, followed up with oncologist for 5 years, currently not seeing oncologist), who presents with productive cough, fever and shortness of breath.   OT comments  Pt demonstrates incr in HR and required close monitoring this session. Pt c/o feeling as if secretion stuck in his throat and unable to cough it up. Pt demonstrates posterior lean at sink. Pt requires MOD I level to return home. Pt currently not at this level for ADLS.    Follow Up Recommendations  Home health OT    Equipment Recommendations  3 in 1 bedside comode    Recommendations for Other Services      Precautions / Restrictions Precautions Precautions: Fall Precaution Comments: watch HR- tachycardiac previously during session Restrictions Weight Bearing Restrictions: No       Mobility Bed Mobility Overal bed mobility: Needs Assistance Bed Mobility: Supine to Sit     Supine to sit: Supervision        Transfers Overall transfer level: Needs assistance Equipment used: Rolling walker (2 wheeled) Transfers: Sit to/from Stand Sit to Stand: Min guard              Balance Overall balance assessment: Needs assistance Sitting-balance support: Bilateral upper extremity supported;Feet supported Sitting balance-Leahy Scale: Fair     Standing balance support: No upper extremity supported;During functional activity Standing balance-Leahy Scale: Poor                     ADL Overall ADL's : Needs assistance/impaired Eating/Feeding:  Minimal assistance;Sitting Eating/Feeding Details (indicate cue type and reason): pt expressed excited to have sausage this AM on tray Grooming: Wash/dry face;Wash/dry hands;Moderate assistance;Standing Grooming Details (indicate cue type and reason): pt with posterior lean at sink level.                   Toilet Transfer Details (indicate cue type and reason): simulated OOB to chair with Min (A)          Functional mobility during ADLs: Minimal assistance;Rolling walker General ADL Comments: pt demonstrates elevated HR with basic transfers. Pt without symptoms      Vision                 Additional Comments: needed cues to locate paper towels on L peripheral vision during hand hygiene   Perception     Praxis      Cognition   Behavior During Therapy: Flat affect Overall Cognitive Status: Within Functional Limits for tasks assessed                       Extremity/Trunk Assessment               Exercises     Shoulder Instructions       General Comments      Pertinent Vitals/ Pain       Pain Assessment: No/denies pain  Home Living  Prior Functioning/Environment              Frequency Min 2X/week     Progress Toward Goals  OT Goals(current goals can now be found in the care plan section)  Progress towards OT goals: Progressing toward goals  Acute Rehab OT Goals Patient Stated Goal: to go home OT Goal Formulation: With patient/family Time For Goal Achievement: 06/29/15 Potential to Achieve Goals: Good ADL Goals Pt Will Perform Grooming: with modified independence;standing Pt Will Perform Upper Body Bathing: with modified independence;sitting Pt Will Transfer to Toilet: with modified independence;bedside commode;ambulating Pt Will Perform Tub/Shower Transfer: Shower transfer;with supervision;rolling walker  Plan Discharge plan remains appropriate     Co-evaluation                 End of Session Equipment Utilized During Treatment: Gait belt;Rolling walker;Oxygen   Activity Tolerance Patient tolerated treatment well   Patient Left in chair;with call bell/phone within reach;with chair alarm set   Nurse Communication Mobility status;Precautions        Time: [phone] OT Time Calculation (min): 28 min  Charges: OT General Charges $OT Visit: 1 Procedure OT Treatments $Self Care/Home Management : 8-22 mins $Therapeutic Activity: 8-22 mins  Parke Poisson B 06/18/2015, 10:21 AM   Jeri Modena   OTR/L Pager: [phone] Office: [phone] .

## 2015-06-19 DIAGNOSIS — J441 Chronic obstructive pulmonary disease with (acute) exacerbation: Secondary | ICD-10-CM

## 2015-06-19 DIAGNOSIS — J810 Acute pulmonary edema: Secondary | ICD-10-CM

## 2015-06-19 DIAGNOSIS — I482 Chronic atrial fibrillation: Secondary | ICD-10-CM

## 2015-06-19 DIAGNOSIS — J9621 Acute and chronic respiratory failure with hypoxia: Secondary | ICD-10-CM

## 2015-06-19 DIAGNOSIS — J9601 Acute respiratory failure with hypoxia: Secondary | ICD-10-CM

## 2015-06-19 DIAGNOSIS — E1122 Type 2 diabetes mellitus with diabetic chronic kidney disease: Secondary | ICD-10-CM

## 2015-06-19 DIAGNOSIS — Z794 Long term (current) use of insulin: Secondary | ICD-10-CM

## 2015-06-19 DIAGNOSIS — I4891 Unspecified atrial fibrillation: Secondary | ICD-10-CM

## 2015-06-19 DIAGNOSIS — J189 Pneumonia, unspecified organism: Secondary | ICD-10-CM

## 2015-06-19 DIAGNOSIS — N182 Chronic kidney disease, stage 2 (mild): Secondary | ICD-10-CM

## 2015-06-19 DIAGNOSIS — N189 Chronic kidney disease, unspecified: Secondary | ICD-10-CM

## 2015-06-19 DIAGNOSIS — E118 Type 2 diabetes mellitus with unspecified complications: Secondary | ICD-10-CM

## 2015-06-19 DIAGNOSIS — N179 Acute kidney failure, unspecified: Secondary | ICD-10-CM

## 2015-06-19 DIAGNOSIS — C3411 Malignant neoplasm of upper lobe, right bronchus or lung: Secondary | ICD-10-CM

## 2015-06-19 DIAGNOSIS — I2510 Atherosclerotic heart disease of native coronary artery without angina pectoris: Secondary | ICD-10-CM

## 2015-06-19 DIAGNOSIS — I429 Cardiomyopathy, unspecified: Secondary | ICD-10-CM

## 2015-06-19 LAB — GLUCOSE, CAPILLARY
Glucose-Capillary: 319 mg/dL — ABNORMAL HIGH (ref 65–99)
Glucose-Capillary: 254 mg/dL — ABNORMAL HIGH (ref 65–99)
Glucose-Capillary: 274 mg/dL — ABNORMAL HIGH (ref 65–99)
Glucose-Capillary: 196 mg/dL — ABNORMAL HIGH (ref 65–99)

## 2015-06-19 LAB — PROTIME-INR
INR: 1.59 — AB (ref 0.00–1.49)
PROTHROMBIN TIME: 19 s — AB (ref 11.6–15.2)

## 2015-06-19 LAB — RENAL FUNCTION PANEL
Albumin: 2.4 g/dL — ABNORMAL LOW (ref 3.5–5.0)
Anion gap: 11 (ref 5–15)
BUN: 45 mg/dL — AB (ref 6–20)
CALCIUM: 8.5 mg/dL — AB (ref 8.9–10.3)
CO2: 27 mmol/L (ref 22–32)
CREATININE: 1.21 mg/dL (ref 0.61–1.24)
Chloride: 96 mmol/L — ABNORMAL LOW (ref 101–111)
GFR calc non Af Amer: 54 mL/min — ABNORMAL LOW (ref >60)
GLUCOSE: 340 mg/dL — AB (ref 65–99)
Phosphorus: 3.4 mg/dL (ref 2.5–4.6)
Potassium: 4.5 mmol/L (ref 3.5–5.1)
SODIUM: 134 mmol/L — AB (ref 135–145)

## 2015-06-19 LAB — MAGNESIUM: MAGNESIUM: 1.9 mg/dL (ref 1.7–2.4)

## 2015-06-19 MED ORDER — INSULIN ASPART 100 UNIT/ML ~~LOC~~ SOLN
4.0000 [IU] | Freq: Three times a day (TID) | SUBCUTANEOUS | Status: DC
  Administered 2015-06-19 – 2015-06-20 (×3): 4 [IU] via SUBCUTANEOUS

## 2015-06-19 MED ORDER — INSULIN ASPART 100 UNIT/ML ~~LOC~~ SOLN
0.0000 [IU] | Freq: Three times a day (TID) | SUBCUTANEOUS | Status: DC
  Administered 2015-06-19 (×2): 8 [IU] via SUBCUTANEOUS
  Administered 2015-06-20: 3 [IU] via SUBCUTANEOUS
  Administered 2015-06-20: 5 [IU] via SUBCUTANEOUS
  Administered 2015-06-21: 2 [IU] via SUBCUTANEOUS
  Administered 2015-06-21: 5 [IU] via SUBCUTANEOUS
  Administered 2015-06-22: 3 [IU] via SUBCUTANEOUS
  Administered 2015-06-22: 2 [IU] via SUBCUTANEOUS
  Administered 2015-06-22: 3 [IU] via SUBCUTANEOUS

## 2015-06-19 MED ORDER — WARFARIN SODIUM 7.5 MG PO TABS
7.5000 mg | ORAL_TABLET | Freq: Once | ORAL | Status: AC
  Administered 2015-06-19: 7.5 mg via ORAL
  Filled 2015-06-19: qty 1

## 2015-06-19 MED ORDER — LEVALBUTEROL HCL 0.63 MG/3ML IN NEBU: 0.6300 mg | INHALATION_SOLUTION | RESPIRATORY_TRACT | Status: DC | PRN

## 2015-06-19 MED ORDER — INSULIN ASPART 100 UNIT/ML ~~LOC~~ SOLN: 0.0000 [IU] | Freq: Every day | SUBCUTANEOUS | Status: DC

## 2015-06-19 MED ORDER — INSULIN GLARGINE 100 UNIT/ML ~~LOC~~ SOLN
15.0000 [IU] | Freq: Every day | SUBCUTANEOUS | Status: DC
  Administered 2015-06-19: 15 [IU] via SUBCUTANEOUS
  Filled 2015-06-19: qty 0.15

## 2015-06-19 MED ORDER — IPRATROPIUM BROMIDE 0.02 % IN SOLN: 0.5000 mg | RESPIRATORY_TRACT | Status: DC | PRN

## 2015-06-19 NOTE — Progress Notes (Signed)
[place] for Coumadin Indication: atrial fibrillation  No Known Allergies  Patient Measurements: Height: 6' (182.9 cm) Weight: 187 lb 1.6 oz (84.868 kg) IBW/kg (Calculated) : 77.6  Vital Signs: Temp: 97.8 F (36.6 C) (04/01 0500) Temp Source: Oral (04/01 0500) BP: 110/89 mmHg (04/01 0500) Pulse Rate: 77 (04/01 0500)  Labs:  Recent Labs  06/17/15 0439  06/17/15 2001 06/18/15 0308 06/18/15 0742 06/19/15 0235  HGB 15.0  --   --   --  15.8  --   HCT 44.3  --   --   --  45.4  --   PLT 378  --   --   --  424*  --   LABPROT 51.7*  < > 24.1* 19.9*  --  19.0*  INR 6.03*  < > 2.19* 1.69*  --  1.59*  CREATININE 1.24  --   --   --  1.30* 1.21  < > = values in this interval not displayed.  Estimated Creatinine Clearance: 52.6 mL/min (by C-G formula based on Cr of 1.21).  Assessment: 82 y.o. M presents with cough, fever, and SOB. On warfarin PTA for afib.   Home regimen: Warfarin 7.'5mg'$  daily exc for '5mg'$  on Thurs PTA for afib. Admit INR therapeutic on home dose.   INR up to 6.03 on 3/30 possibly due to DI with azithromycin (now d/c'd).  MD gave vitamin K 3 mg IV 3/30. INR 1.69>1.59 after resuming warfarin 3/31. CBC ok. No bleed reported.  Goal of Therapy:  INR 2-3 Monitor platelets by anticoagulation protocol: Yes   Plan:  Warfarin 7.5 mg po x 1 dose Daily INR F/u for S/sx bleeding  Elicia Lamp, PharmD, BCPS Clinical Pharmacist Pager [phone] 06/19/2015 12:15 PM

## 2015-06-19 NOTE — Progress Notes (Signed)
Progress Note   Justin Garner ERX:[id number] DOB: Aug 17, 1932 DOA: 06/13/2015 PCP: Kandice Hams, MD   Brief Narrative:   Justin Garner is an 82 y.o. male with Hx of hypertension, hyperlipidemia, COPD, GERD, hypothyroidism, CAD, pulmonary embolism, PAF on Coumadin, systolic congestive heart failure with EF 45%, chronic kidney disease stage II, right upper lobe lung cancer (S/P surgery, radiation therapy and chemotherapy, followed up with oncologist for 5 years, currently not seeing oncologist), who presented with productive cough, fever and shortness of breath. In the ED, patient was found to have WBC 11.7, lactate of 2.47, temperature 102.4, tachycardia, tachypnea, positive urinalysis, potassium 2.9, worsening renal function. Oxygen desaturation to 89% on room air. Chest x-ray showed chronic changes similar to that noted on the prior exam, no acute infiltrate.  Assessment/Plan:   Principal Problem: Acute on chronic respiratory failure with hypoxia (HCC)/Probable community-acquired pneumonia/small right pleural effusion Chest x-ray on admission negative for infiltrate, but suspicion of community-acquired pneumonia persists. There may also be contributions from COPD/CHF. Both influenza panel and respiratory virus panels were negative. Blood cultures were negative. Follow-up chest x-ray noted worsening bilateral infiltrates, but patient improving clinically. Continue azithromycin/Rocephin to complete a seven-day course. Sputum cultures grew normal oral pharyngeal flora. Dropped his oxygen saturation to 86% when ambulating with PT.  Active Problems: COPD (chronic obstructive pulmonary disease) with emphysema (HCC)/COPD exacerbation Completed a course of steroids. Continue bronchodilators as needed.  Atrial fibrillation with RVR (HCC)/chronic atrial fibrillation CHA2DS2-VASc Score is 5. Continue metoprolol for rate control. Coumadin resumed. INR currently subtherapeutic.  Coronary  atherosclerosis of native coronary artery Stable. Continue Lipitor.  Essential hypertension, benign Blood pressure controlled.  Lung cancer, upper lobe ([place]) Status post VATS, thoracotomy in 2009.  Hypokalemia Potassium repleted. Remains WNL.  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 Inadequate glycemic control noted with steroids. CBGs [phone] despite discontinuation of steroids. Currently being managed with moderate scale SSI and 10 units of Lantus. Will add 4 units of meal coverage and increase Lantus to 15 units. Diabetes coordinator following.  Chronic systolic CHF (congestive heart failure) ([place]) /cardiomyopathy 2-D echo done 07/18/13 showed EF 45%. Repeat echo done 06/16/15 with EF 50-55 percent. Patient remains at baseline weight, 4 pounds down from yesterday's weight. Creatinine stabilizing. Would continue to hold further diuretics unless there is evidence of decompensation.  Acute on chronic kidney failure-II/acute kidney injury Creatinine slightly improved today. Hold diuretics.  Hypothyroidism Continue Synthroid.  HLD (hyperlipidemia) Continue Lipitor.  CAD in native artery Troponins cycled. 0.03, 0.04, 0.03.  Hypomagnesemia Resolved with supplementation.    DVT Prophylaxis Chronically anticoagulated with Coumadin.   Family Communication/Anticipated D/C date and plan/Code Status   Family Communication: Wife updated at the bedside. Disposition Plan/date: From home with wife. PT recommends home health PT and 24 hour supervision. Code Status: DO NOT RESUSCITATE   Procedures and diagnostic studies:   Dg Chest 2 View  06/13/2015  CLINICAL DATA:  Cough and fever for 3 days, initial encounter EXAM: CHEST  2 VIEW COMPARISON:  08/21/2014 FINDINGS: Cardiac shadow is stable. The lungs are well aerated bilaterally and demonstrate some chronic changes similar to that seen on the prior exam. No focal  infiltrate or sizable effusion is noted. No acute bony abnormality is seen. IMPRESSION: Chronic changes similar to that noted on the prior exam. No acute infiltrate is seen. Electronically Signed   By: Inez Catalina M.D.   On: 06/13/2015 19:32  Dg Chest Port 1 View  06/15/2015  CLINICAL DATA:  Community-acquired pneumonia, history of COPD, lung malignancy EXAM: PORTABLE CHEST 1 VIEW COMPARISON:  PA and lateral chest x-ray of June 13, 2015 FINDINGS: The lungs are adequately inflated. The interstitial markings have become more conspicuous bilaterally. The cardiac silhouette is top-normal in size. The central pulmonary vascularity is prominent. The mediastinum is normal in width. There is a pleural effusion on the right. The left lateral costophrenic angle is excluded from the study. The bony thorax is unremarkable where visualized. IMPRESSION: The study is limited by patient positioning. Worsening of bilateral interstitial infiltrates has occurred. A small right pleural effusion is present and more conspicuous than on the earlier study. Electronically Signed   By: David  Martinique M.D.   On: 06/15/2015 07:23    Medical Consultants:    None.  Anti-Infectives:   Azithro 3/26 > 3/29 Rocephin 3/26 >  Subjective:   Justin Garner feels weak, but dyspnea has improved. Denies any nausea, vomiting or diarrhea. Appetite fair.  Objective:    Filed Vitals:   06/18/15 1644 06/18/15 1952 06/18/15 2000 06/19/15 0500  BP:   119/83 110/89  Pulse:    77  Temp:   97.6 F (36.4 C) 97.8 F (36.6 C)  TempSrc:   Oral Oral  Resp:   16 18  Height:      Weight:    84.868 kg (187 lb 1.6 oz)  SpO2: 96% 98% 96% 95%    Intake/Output Summary (Last 24 hours) at 06/19/15 0813 Last data filed at 06/19/15 0500  Gross per 24 hour  Intake    340 ml  Output   1300 ml  Net   -960 ml   Filed Weights   06/17/15 0500 06/18/15 0608 06/19/15 0500  Weight: 88.2 kg (194 lb 7.1 oz) 86.7 kg (191 lb 2.2 oz) 84.868 kg  (187 lb 1.6 oz)    Exam: Gen:  Weak elderly male in no acute distress Cardiovascular:  RRR, No M/R/G Respiratory:  Lungs diminished Gastrointestinal:  Abdomen soft, NT/ND, + BS Extremities:  No C/E/C   Data Reviewed:    Labs: Basic Metabolic Panel:  Recent Labs Lab 06/15/15 0519 06/15/15 0748 06/16/15 0250 06/17/15 0439 06/18/15 0742 06/19/15 0235  NA 138  --  140 138 134* 134*  K 4.0  --  3.4* 3.7 4.4 4.5  CL 107  --  104 102 96* 96*  CO2 22  --  '26 26 29 27  '$ GLUCOSE 152*  --  227* 309* 302* 340*  BUN 24*  --  24* 28* 36* 45*  CREATININE 1.02  --  1.19 1.24 1.30* 1.21  CALCIUM 8.4*  --  8.7* 8.8* 8.4* 8.5*  MG 1.7 1.7 2.0 1.6* 1.5* 1.9  PHOS  --   --   --   --   --  3.4   GFR Estimated Creatinine Clearance: 52.6 mL/min (by C-G formula based on Cr of 1.21). Liver Function Tests:  Recent Labs Lab 06/13/15 1940 06/15/15 0519 06/16/15 0250 06/17/15 0439 06/18/15 0742 06/19/15 0235  AST '24 28 29 '$ 32 35  --   ALT '18 21 23 28 '$ 38  --   ALKPHOS 111 142* 139* 136* 131*  --   BILITOT 3.5* 1.9* 1.3* 1.0 1.4*  --   PROT 6.0* 6.1* 5.9* 5.7* 5.4*  --   ALBUMIN 3.0* 2.7* 2.6* 2.4* 2.4* 2.4*   Coagulation profile  Recent Labs Lab 06/17/15 0439 06/17/15 1212 06/17/15 2001  06/18/15 0308 06/19/15 0235  INR 6.03* 3.96* 2.19* 1.69* 1.59*    CBC:  Recent Labs Lab 06/13/15 1940 06/15/15 0519 06/16/15 0250 06/17/15 0439 06/18/15 0742  WBC 11.7* 15.4* 13.8* 19.3* 21.0*  NEUTROABS 9.9*  --  12.3*  --  18.8*  HGB 15.6 14.8 14.7 15.0 15.8  HCT 45.7 44.7 43.9 44.3 45.4  MCV 87.7 89.8 89.0 89.0 88.0  PLT 248 274 327 378 424*   Cardiac Enzymes:  Recent Labs Lab 06/14/15 06/14/15 0229 06/14/15 1049  TROPONINI 0.03 0.04* 0.03   CBG:  Recent Labs Lab 06/18/15 0818 06/18/15 1228 06/18/15 1657 06/18/15 2029 06/19/15 0628  GLUCAP 296* 332* 352* 315* 319*   Sepsis Labs:  Recent Labs Lab 06/13/15 2028 06/13/15 2209 06/14/15 06/14/15 0001  06/14/15 0243 06/15/15 0519 06/16/15 0250 06/17/15 0439 06/18/15 0742  PROCALCITON  --   --  0.93  --   --   --   --   --   --   WBC  --   --   --   --   --  15.4* 13.8* 19.3* 21.0*  LATICACIDVEN 2.47* 4.31*  --  2.0 2.1*  --   --   --   --    Microbiology Recent Results (from the past 240 hour(s))  Urine culture     Status: None   Collection Time: 06/13/15  7:21 PM  Result Value Ref Range Status   Specimen Description URINE, RANDOM  Final   Special Requests NONE  Final   Culture NO GROWTH 1 DAY  Final   Report Status 06/14/2015 FINAL  Final  Culture, blood (routine x 2)     Status: None   Collection Time: 06/13/15  7:30 PM  Result Value Ref Range Status   Specimen Description BLOOD FOREARM  Final   Special Requests BOTTLES DRAWN AEROBIC AND ANAEROBIC 5CC  Final   Culture NO GROWTH 5 DAYS  Final   Report Status 06/18/2015 FINAL  Final  Culture, blood (routine x 2)     Status: None   Collection Time: 06/13/15  7:43 PM  Result Value Ref Range Status   Specimen Description BLOOD RIGHT ANTECUBITAL  Final   Special Requests BOTTLES DRAWN AEROBIC AND ANAEROBIC 5CC  Final   Culture NO GROWTH 5 DAYS  Final   Report Status 06/18/2015 FINAL  Final  Respiratory virus panel     Status: None   Collection Time: 06/14/15 12:42 AM  Result Value Ref Range Status   Source - RVPAN NASAL SWAB  Corrected   Respiratory Syncytial Virus A Negative Negative Final   Respiratory Syncytial Virus B Negative Negative Final   Influenza A Negative Negative Final   Influenza B Negative Negative Final   Parainfluenza 1 Negative Negative Final   Parainfluenza 2 Negative Negative Final   Parainfluenza 3 Negative Negative Final   Metapneumovirus Negative Negative Final   Rhinovirus Negative Negative Final   Adenovirus Negative Negative Final    Comment: (NOTE) Performed At: [facility] [place], [place] [id number] Lindon Romp MD NU:[id number]   MRSA PCR Screening      Status: None   Collection Time: 06/14/15 12:52 AM  Result Value Ref Range Status   MRSA by PCR NEGATIVE NEGATIVE Final    Comment:        The GeneXpert MRSA Assay (FDA approved for NASAL specimens only), is one component of a comprehensive MRSA colonization surveillance program. It is not intended to diagnose MRSA infection  nor to guide or monitor treatment for MRSA infections.   Culture, sputum-assessment     Status: None   Collection Time: 06/14/15  4:45 AM  Result Value Ref Range Status   Specimen Description SPUTUM  Final   Special Requests NONE  Final   Sputum evaluation THIS SPECIMEN IS ACCEPTABLE FOR SPUTUM CULTURE  Final   Report Status 06/14/2015 FINAL  Final  Culture, respiratory (NON-Expectorated)     Status: None   Collection Time: 06/14/15  4:45 AM  Result Value Ref Range Status   Specimen Description SPUTUM  Final   Special Requests NONE  Final   Gram Stain   Final    ABUNDANT WBC PRESENT,BOTH PMN AND MONONUCLEAR MODERATE SQUAMOUS EPITHELIAL CELLS PRESENT ABUNDANT GRAM POSITIVE COCCI IN PAIRS IN CLUSTERS MODERATE GRAM NEGATIVE RODS THIS SPECIMEN IS ACCEPTABLE FOR SPUTUM CULTURE Performed at [facility]    Culture   Final    NORMAL OROPHARYNGEAL FLORA Performed at [facility]    Report Status 06/16/2015 FINAL  Final     Medications:   . antiseptic oral rinse  7 mL Mouth Rinse BID  . atorvastatin  20 mg Oral q morning - 10a  . cefTRIAXone (ROCEPHIN)  IV  1 g Intravenous Q24H  . dextromethorphan-guaiFENesin  1 tablet Oral BID  . diltiazem  240 mg Oral Daily  . insulin aspart  0-15 Units Subcutaneous TID WC  . insulin aspart  0-5 Units Subcutaneous QHS  . insulin glargine  10 Units Subcutaneous QHS  . ipratropium  0.5 mg Nebulization QID  . levalbuterol  0.63 mg Nebulization QID  . levothyroxine  112 mcg Oral QAC breakfast  . metoprolol tartrate  12.5 mg Oral BID  . omega-3 acid ethyl esters  1 g Oral Daily  . pantoprazole  40 mg  Oral Daily  . Warfarin - Pharmacist Dosing Inpatient   Does not apply q1800   Continuous Infusions:   Time spent: 25 minutes.   LOS: 6 days   [place] [facility] Pager [phone]. If unable to reach me by pager, please call my cell phone at [phone].  *Please refer to [email or web address], password [email or web address] to get updated schedule on who will round on this patient, as hospitalists switch teams weekly. If 7PM-7AM, please contact night-coverage at [email or web address], password [email or web address] for any overnight needs.  06/19/2015, 8:13 AM

## 2015-06-20 ENCOUNTER — Inpatient Hospital Stay (HOSPITAL_COMMUNITY): Payer: Medicare Other

## 2015-06-20 ENCOUNTER — Encounter (HOSPITAL_COMMUNITY): Payer: Self-pay | Admitting: Radiology

## 2015-06-20 DIAGNOSIS — J438 Other emphysema: Secondary | ICD-10-CM

## 2015-06-20 DIAGNOSIS — I429 Cardiomyopathy, unspecified: Secondary | ICD-10-CM | POA: Insufficient documentation

## 2015-06-20 DIAGNOSIS — E8729 Other acidosis: Secondary | ICD-10-CM | POA: Insufficient documentation

## 2015-06-20 DIAGNOSIS — E872 Acidosis: Secondary | ICD-10-CM | POA: Insufficient documentation

## 2015-06-20 DIAGNOSIS — I482 Chronic atrial fibrillation, unspecified: Secondary | ICD-10-CM | POA: Diagnosis present

## 2015-06-20 DIAGNOSIS — J449 Chronic obstructive pulmonary disease, unspecified: Secondary | ICD-10-CM | POA: Diagnosis present

## 2015-06-20 DIAGNOSIS — I5032 Chronic diastolic (congestive) heart failure: Secondary | ICD-10-CM

## 2015-06-20 LAB — CBC WITH DIFFERENTIAL/PLATELET
BASOS PCT: 0 %
Basophils Absolute: 0 10*3/uL (ref 0.0–0.1)
EOS ABS: 0 10*3/uL (ref 0.0–0.7)
Eosinophils Relative: 0 %
HCT: 47.5 % (ref 39.0–52.0)
Hemoglobin: 16 g/dL (ref 13.0–17.0)
Lymphocytes Relative: 6 %
Lymphs Abs: 1.5 10*3/uL (ref 0.7–4.0)
MCH: 30.3 pg (ref 26.0–34.0)
MCHC: 33.7 g/dL (ref 30.0–36.0)
MCV: 90 fL (ref 78.0–100.0)
MONO ABS: 1.5 10*3/uL — AB (ref 0.1–1.0)
Monocytes Relative: 6 %
NEUTROS ABS: 21.2 10*3/uL — AB (ref 1.7–7.7)
NEUTROS PCT: 88 %
PLATELETS: 424 10*3/uL — AB (ref 150–400)
RBC: 5.28 MIL/uL (ref 4.22–5.81)
RDW: 15.2 % (ref 11.5–15.5)
WBC: 24.2 10*3/uL — ABNORMAL HIGH (ref 4.0–10.5)

## 2015-06-20 LAB — GLUCOSE, CAPILLARY
GLUCOSE-CAPILLARY: 222 mg/dL — AB (ref 65–99)
GLUCOSE-CAPILLARY: 104 mg/dL — AB (ref 65–99)
Glucose-Capillary: 161 mg/dL — ABNORMAL HIGH (ref 65–99)
Glucose-Capillary: 150 mg/dL — ABNORMAL HIGH (ref 65–99)

## 2015-06-20 LAB — BASIC METABOLIC PANEL
Anion gap: 8 (ref 5–15)
BUN: 42 mg/dL — AB (ref 6–20)
CALCIUM: 8.3 mg/dL — AB (ref 8.9–10.3)
CO2: 29 mmol/L (ref 22–32)
CREATININE: 1.2 mg/dL (ref 0.61–1.24)
Chloride: 98 mmol/L — ABNORMAL LOW (ref 101–111)
GFR calc Af Amer: >60 mL/min (ref >60)
GFR, EST NON AFRICAN AMERICAN: 55 mL/min — AB (ref >60)
Glucose, Bld: 247 mg/dL — ABNORMAL HIGH (ref 65–99)
Potassium: 4.5 mmol/L (ref 3.5–5.1)
SODIUM: 135 mmol/L (ref 135–145)

## 2015-06-20 LAB — PROTIME-INR
INR: 1.91 — AB (ref 0.00–1.49)
PROTHROMBIN TIME: 21.8 s — AB (ref 11.6–15.2)

## 2015-06-20 MED ORDER — VANCOMYCIN HCL 10 G IV SOLR
1500.0000 mg | Freq: Once | INTRAVENOUS | Status: AC
  Administered 2015-06-20: 1500 mg via INTRAVENOUS
  Filled 2015-06-20 (×2): qty 1500

## 2015-06-20 MED ORDER — INSULIN ASPART 100 UNIT/ML ~~LOC~~ SOLN
5.0000 [IU] | Freq: Three times a day (TID) | SUBCUTANEOUS | Status: DC
  Administered 2015-06-20 – 2015-06-25 (×13): 5 [IU] via SUBCUTANEOUS

## 2015-06-20 MED ORDER — VANCOMYCIN HCL IN DEXTROSE 750-5 MG/150ML-% IV SOLN
750.0000 mg | Freq: Two times a day (BID) | INTRAVENOUS | Status: DC
  Administered 2015-06-21 – 2015-06-23 (×5): 750 mg via INTRAVENOUS
  Filled 2015-06-20 (×6): qty 150

## 2015-06-20 MED ORDER — INSULIN GLARGINE 100 UNIT/ML ~~LOC~~ SOLN
20.0000 [IU] | Freq: Every day | SUBCUTANEOUS | Status: DC
  Administered 2015-06-20 – 2015-06-21 (×2): 20 [IU] via SUBCUTANEOUS
  Filled 2015-06-20 (×3): qty 0.2

## 2015-06-20 MED ORDER — IOPAMIDOL (ISOVUE-300) INJECTION 61%
INTRAVENOUS | Status: AC
  Administered 2015-06-20: 75 mL
  Filled 2015-06-20: qty 75

## 2015-06-20 MED ORDER — WARFARIN SODIUM 7.5 MG PO TABS
7.5000 mg | ORAL_TABLET | Freq: Once | ORAL | Status: AC
  Administered 2015-06-20: 7.5 mg via ORAL
  Filled 2015-06-20: qty 1

## 2015-06-20 MED ORDER — CEFEPIME HCL 1 G IJ SOLR
1.0000 g | Freq: Three times a day (TID) | INTRAMUSCULAR | Status: DC
  Administered 2015-06-20 – 2015-06-23 (×9): 1 g via INTRAVENOUS
  Filled 2015-06-20 (×11): qty 1

## 2015-06-20 NOTE — Progress Notes (Signed)
[place] for Coumadin Indication: atrial fibrillation  No Known Allergies  Patient Measurements: Height: 6' (182.9 cm) Weight: 186 lb 11.7 oz (84.7 kg) IBW/kg (Calculated) : 77.6  Vital Signs: Temp: 97.9 F (36.6 C) (04/02 0638) Temp Source: Oral (04/02 1610) BP: 114/81 mmHg (04/02 9604) Pulse Rate: 71 (04/02 0638)  Labs:  Recent Labs  06/18/15 0308 06/18/15 0742 06/19/15 0235 06/20/15 0227  HGB  --  15.8  --  16.0  HCT  --  45.4  --  47.5  PLT  --  424*  --  424*  LABPROT 19.9*  --  19.0* 21.8*  INR 1.69*  --  1.59* 1.91*  CREATININE  --  1.30* 1.21 1.20    Estimated Creatinine Clearance: 53 mL/min (by C-G formula based on Cr of 1.2).  Assessment: 82 y.o. M presents with cough, fever, and SOB. On warfarin PTA for afib.   Home regimen: Warfarin 7.'5mg'$  daily exc for '5mg'$  on Thurs PTA for afib. Admit INR therapeutic on home dose.   INR up to 6.03 on 3/30 possibly due to DI with azithromycin (now d/c'd).  MD gave vitamin K 3 mg IV 3/30. INR 1.59>1.91 after resuming warfarin 3/31. CBC ok. No bleed reported.  Goal of Therapy:  INR 2-3 Monitor platelets by anticoagulation protocol: Yes   Plan:  Warfarin 7.5 mg po x 1 dose Daily INR F/u for S/sx bleeding  Elicia Lamp, PharmD, BCPS Clinical Pharmacist Pager [phone] 06/20/2015 12:05 PM

## 2015-06-20 NOTE — Progress Notes (Signed)
Progress Note   Justin Garner CBS:[id number] DOB: [birth year] DOA: 06/13/2015 PCP: Kandice Hams, MD   Brief Narrative:   Justin Garner is an 82 y.o. male with Hx of hypertension, hyperlipidemia, COPD, GERD, hypothyroidism, CAD, pulmonary embolism, PAF on Coumadin, systolic congestive heart failure with EF 45%, chronic kidney disease stage II, right upper lobe lung cancer (S/P surgery, radiation therapy and chemotherapy, followed up with oncologist for 5 years, currently not seeing oncologist), who presented with productive cough, fever and shortness of breath. In the ED, patient was found to have WBC 11.7, lactate of 2.47, temperature 102.4, tachycardia, tachypnea, positive urinalysis, potassium 2.9, worsening renal function. Oxygen desaturation to 89% on room air. Chest x-ray showed chronic changes similar to that noted on the prior exam, no acute infiltrate.  Assessment/Plan:   Principal Problem: Acute on chronic respiratory failure with hypoxia (HCC)/Probable community-acquired pneumonia/small right pleural effusion Chest x-ray on admission negative for infiltrate, but suspicion of community-acquired pneumonia persists. There may also be contributions from COPD/CHF. Both influenza panel and respiratory virus panels were negative. Blood cultures were negative. Follow-up chest x-ray noted worsening bilateral infiltrates, but patient improving clinically. Continue azithromycin/Rocephin to complete a seven-day course. Sputum cultures grew normal oral pharyngeal flora. Dropped his oxygen saturation to 86% when ambulating with PT. Given persistent/rising leukocytosis, and lack of clinical improvement, will get a CT chest r/o empyema/abscess.  Active Problems: COPD (chronic obstructive pulmonary disease) with emphysema (HCC)/COPD exacerbation Completed a course of steroids. Continue bronchodilators as needed.  Atrial fibrillation with RVR (HCC)/chronic atrial  fibrillation CHA2DS2-VASc Score is 5. Continue metoprolol for rate control. Coumadin resumed. INR 1.91.  Coronary atherosclerosis of native coronary artery Stable. Continue Lipitor.  Essential hypertension, benign Blood pressure controlled.  Lung cancer, upper lobe ([place]) Status post VATS, thoracotomy in 2009.  Hypokalemia Potassium repleted. Remains WNL.  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 Inadequate glycemic control noted with steroids. CBGs 196-319 despite discontinuation of steroids. Currently being managed with moderate scale SSI with 4 units of meal coverage and 15 units of Lantus. Increase Lantus to 20 units and meal coverage to 5 units. Diabetes coordinator following.  Chronic systolic CHF (congestive heart failure) ([place]) /cardiomyopathy 2-D echo done 07/18/13 showed EF 45%. Repeat echo done 06/16/15 with EF 50-55 percent. Patient remains at baseline weight. Creatinine remains stable. Would continue to hold further diuretics unless there is evidence of decompensation.  Acute on chronic kidney failure-II/acute kidney injury Creatinine stable. Hold further diuretics.  Hypothyroidism Continue Synthroid.  HLD (hyperlipidemia) Continue Lipitor.  CAD in native artery Troponins cycled. 0.03, 0.04, 0.03.  Hypomagnesemia Resolved with supplementation.  DVT Prophylaxis Chronically anticoagulated with Coumadin.   Family Communication/Anticipated D/C date and plan/Code Status   Family Communication: Wife updated at the bedside. Disposition Plan/date: From home with wife. PT recommends home health PT and 24 hour supervision. Code Status: DO NOT RESUSCITATE   Procedures and diagnostic studies:   Dg Chest 2 View  06/13/2015  CLINICAL DATA:  Cough and fever for 3 days, initial encounter EXAM: CHEST  2 VIEW COMPARISON:  08/21/2014 FINDINGS: Cardiac shadow is stable. The lungs are well aerated bilaterally  and demonstrate some chronic changes similar to that seen on the prior exam. No focal infiltrate or sizable effusion is noted. No acute bony abnormality is seen. IMPRESSION: Chronic changes similar to that noted on the prior exam. No acute infiltrate is seen. Electronically Signed   By:  Justin Catalina M.D.   On: 06/13/2015 19:32   Dg Chest Port 1 View  06/15/2015  CLINICAL DATA:  Community-acquired pneumonia, history of COPD, lung malignancy EXAM: PORTABLE CHEST 1 VIEW COMPARISON:  PA and lateral chest x-ray of June 13, 2015 FINDINGS: The lungs are adequately inflated. The interstitial markings have become more conspicuous bilaterally. The cardiac silhouette is top-normal in size. The central pulmonary vascularity is prominent. The mediastinum is normal in width. There is a pleural effusion on the right. The left lateral costophrenic angle is excluded from the study. The bony thorax is unremarkable where visualized. IMPRESSION: The study is limited by patient positioning. Worsening of bilateral interstitial infiltrates has occurred. A small right pleural effusion is present and more conspicuous than on the earlier study. Electronically Signed   By: David  Martinique M.D.   On: 06/15/2015 07:23    Medical Consultants:    None.  Anti-Infectives:   Azithro 3/26 > 3/29 Rocephin 3/26 >  Subjective:   Justin Garner feels weak, wife says he "stays so sleepy". Has dyspnea at times.  Denies any nausea, vomiting or diarrhea. Appetite fair.  Objective:    Filed Vitals:   06/19/15 1500 06/19/15 1602 06/19/15 2133 06/20/15 0638  BP:  111/65 118/85 114/81  Pulse:  68 51 71  Temp: 97.4 F (36.3 C) 97.4 F (36.3 C) 97.4 F (36.3 C) 97.9 F (36.6 C)  TempSrc:  Oral Oral Oral  Resp:  '14 20 20  '$ Height:      Weight:    84.7 kg (186 lb 11.7 oz)  SpO2:  95% 93% 93%    Intake/Output Summary (Last 24 hours) at 06/20/15 0752 Last data filed at 06/20/15 6283  Gross per 24 hour  Intake    120 ml   Output   1525 ml  Net  -1405 ml   Filed Weights   06/18/15 0608 06/19/15 0500 06/20/15 1517  Weight: 86.7 kg (191 lb 2.2 oz) 84.868 kg (187 lb 1.6 oz) 84.7 kg (186 lb 11.7 oz)    Exam: Gen:  Weak elderly male in no acute distress Cardiovascular:  RRR, No M/R/G Respiratory:  Lungs diminished Gastrointestinal:  Abdomen soft, NT/ND, + BS Extremities:  No C/E/C   Data Reviewed:    Labs: Basic Metabolic Panel:  Recent Labs Lab 06/15/15 0748 06/16/15 0250 06/17/15 0439 06/18/15 0742 06/19/15 0235 06/20/15 0227  NA  --  140 138 134* 134* 135  K  --  3.4* 3.7 4.4 4.5 4.5  CL  --  104 102 96* 96* 98*  CO2  --  '26 26 29 27 29  '$ GLUCOSE  --  227* 309* 302* 340* 247*  BUN  --  24* 28* 36* 45* 42*  CREATININE  --  1.19 1.24 1.30* 1.21 1.20  CALCIUM  --  8.7* 8.8* 8.4* 8.5* 8.3*  MG 1.7 2.0 1.6* 1.5* 1.9  --   PHOS  --   --   --   --  3.4  --    GFR Estimated Creatinine Clearance: 53 mL/min (by C-G formula based on Cr of 1.2). Liver Function Tests:  Recent Labs Lab 06/13/15 1940 06/15/15 0519 06/16/15 0250 06/17/15 0439 06/18/15 0742 06/19/15 0235  AST '24 28 29 '$ 32 35  --   ALT '18 21 23 28 '$ 38  --   ALKPHOS 111 142* 139* 136* 131*  --   BILITOT 3.5* 1.9* 1.3* 1.0 1.4*  --   PROT 6.0* 6.1* 5.9* 5.7* 5.4*  --  ALBUMIN 3.0* 2.7* 2.6* 2.4* 2.4* 2.4*   Coagulation profile  Recent Labs Lab 06/17/15 1212 06/17/15 2001 06/18/15 0308 06/19/15 0235 06/20/15 0227  INR 3.96* 2.19* 1.69* 1.59* 1.91*    CBC:  Recent Labs Lab 06/13/15 1940 06/15/15 0519 06/16/15 0250 06/17/15 0439 06/18/15 0742 06/20/15 0227  WBC 11.7* 15.4* 13.8* 19.3* 21.0* 24.2*  NEUTROABS 9.9*  --  12.3*  --  18.8* 21.2*  HGB 15.6 14.8 14.7 15.0 15.8 16.0  HCT 45.7 44.7 43.9 44.3 45.4 47.5  MCV 87.7 89.8 89.0 89.0 88.0 90.0  PLT 248 274 327 378 424* 424*   Cardiac Enzymes:  Recent Labs Lab 06/14/15 06/14/15 0229 06/14/15 1049  TROPONINI 0.03 0.04* 0.03   CBG:  Recent Labs Lab  06/19/15 0628 06/19/15 1122 06/19/15 1615 06/19/15 2136 06/20/15 0620  GLUCAP 319* 254* 274* 196* 222*   Sepsis Labs:  Recent Labs Lab 06/13/15 2028 06/13/15 2209 06/14/15 06/14/15 0001 06/14/15 0243  06/16/15 0250 06/17/15 0439 06/18/15 0742 06/20/15 0227  PROCALCITON  --   --  0.93  --   --   --   --   --   --   --   WBC  --   --   --   --   --   < > 13.8* 19.3* 21.0* 24.2*  LATICACIDVEN 2.47* 4.31*  --  2.0 2.1*  --   --   --   --   --   < > = values in this interval not displayed. Microbiology Recent Results (from the past 240 hour(s))  Urine culture     Status: None   Collection Time: 06/13/15  7:21 PM  Result Value Ref Range Status   Specimen Description URINE, RANDOM  Final   Special Requests NONE  Final   Culture NO GROWTH 1 DAY  Final   Report Status 06/14/2015 FINAL  Final  Culture, blood (routine x 2)     Status: None   Collection Time: 06/13/15  7:30 PM  Result Value Ref Range Status   Specimen Description BLOOD FOREARM  Final   Special Requests BOTTLES DRAWN AEROBIC AND ANAEROBIC 5CC  Final   Culture NO GROWTH 5 DAYS  Final   Report Status 06/18/2015 FINAL  Final  Culture, blood (routine x 2)     Status: None   Collection Time: 06/13/15  7:43 PM  Result Value Ref Range Status   Specimen Description BLOOD RIGHT ANTECUBITAL  Final   Special Requests BOTTLES DRAWN AEROBIC AND ANAEROBIC 5CC  Final   Culture NO GROWTH 5 DAYS  Final   Report Status 06/18/2015 FINAL  Final  Respiratory virus panel     Status: None   Collection Time: 06/14/15 12:42 AM  Result Value Ref Range Status   Source - RVPAN NASAL SWAB  Corrected   Respiratory Syncytial Virus A Negative Negative Final   Respiratory Syncytial Virus B Negative Negative Final   Influenza A Negative Negative Final   Influenza B Negative Negative Final   Parainfluenza 1 Negative Negative Final   Parainfluenza 2 Negative Negative Final   Parainfluenza 3 Negative Negative Final   Metapneumovirus Negative  Negative Final   Rhinovirus Negative Negative Final   Adenovirus Negative Negative Final    Comment: (NOTE) Performed At: [facility] [place], [place] [id number] Lindon Romp MD [id number]   MRSA PCR Screening     Status: None   Collection Time: 06/14/15 12:52 AM  Result Value Ref Range Status  MRSA by PCR NEGATIVE NEGATIVE Final    Comment:        The GeneXpert MRSA Assay (FDA approved for NASAL specimens only), is one component of a comprehensive MRSA colonization surveillance program. It is not intended to diagnose MRSA infection nor to guide or monitor treatment for MRSA infections.   Culture, sputum-assessment     Status: None   Collection Time: 06/14/15  4:45 AM  Result Value Ref Range Status   Specimen Description SPUTUM  Final   Special Requests NONE  Final   Sputum evaluation THIS SPECIMEN IS ACCEPTABLE FOR SPUTUM CULTURE  Final   Report Status 06/14/2015 FINAL  Final  Culture, respiratory (NON-Expectorated)     Status: None   Collection Time: 06/14/15  4:45 AM  Result Value Ref Range Status   Specimen Description SPUTUM  Final   Special Requests NONE  Final   Gram Stain   Final    ABUNDANT WBC PRESENT,BOTH PMN AND MONONUCLEAR MODERATE SQUAMOUS EPITHELIAL CELLS PRESENT ABUNDANT GRAM POSITIVE COCCI IN PAIRS IN CLUSTERS MODERATE GRAM NEGATIVE RODS THIS SPECIMEN IS ACCEPTABLE FOR SPUTUM CULTURE Performed at [facility]    Culture   Final    NORMAL OROPHARYNGEAL FLORA Performed at [facility]    Report Status 06/16/2015 FINAL  Final     Medications:   . antiseptic oral rinse  7 mL Mouth Rinse BID  . atorvastatin  20 mg Oral q morning - 10a  . cefTRIAXone (ROCEPHIN)  IV  1 g Intravenous Q24H  . dextromethorphan-guaiFENesin  1 tablet Oral BID  . diltiazem  240 mg Oral Daily  . insulin aspart  0-15 Units Subcutaneous TID WC  . insulin aspart  0-5 Units Subcutaneous QHS  . insulin aspart  4 Units  Subcutaneous TID WC  . insulin glargine  15 Units Subcutaneous QHS  . levothyroxine  112 mcg Oral QAC breakfast  . metoprolol tartrate  12.5 mg Oral BID  . omega-3 acid ethyl esters  1 g Oral Daily  . pantoprazole  40 mg Oral Daily  . Warfarin - Pharmacist Dosing Inpatient   Does not apply q1800   Continuous Infusions:   Time spent: 25 minutes.   LOS: 7 days   [place] Pager [phone]. If unable to reach me by pager, please call my cell phone at [phone].  *Please refer to [email or web address], password [email or web address] to get updated schedule on who will round on this patient, as hospitalists switch teams weekly. If 7PM-7AM, please contact night-coverage at [email or web address], password [email or web address] for any overnight needs.  06/20/2015, 7:52 AM

## 2015-06-20 NOTE — Progress Notes (Signed)
Pharmacy Antibiotic Note  Justin Garner is a 82 y.o. male admitted on 06/13/2015 with pneumonia.  Pharmacy has been consulted for vancomycin and zosyn dosing. Pt is afebrile and WBC is elevated. Scr is 1.2.  Plan: - Cefepime 1gm IV Q8H - Vancomycin '1500mg'$  IV x 1 then '750mg'$  IV Q12H - F/u renal fxn, C&S, clinical status and trough at SS  Height: 6' (182.9 cm) Weight: 186 lb 11.7 oz (84.7 kg) IBW/kg (Calculated) : 77.6  Temp (24hrs), Avg:97.6 F (36.4 C), Min:97.4 F (36.3 C), Max:97.9 F (36.6 C)   Recent Labs Lab 06/13/15 2028 06/13/15 2209 06/14/15 0001 06/14/15 0243  06/15/15 0519 06/16/15 0250 06/17/15 0439 06/18/15 0742 06/19/15 0235 06/20/15 0227  WBC  --   --   --   --   --  15.4* 13.8* 19.3* 21.0*  --  24.2*  CREATININE  --   --   --   --   < > 1.02 1.19 1.24 1.30* 1.21 1.20  LATICACIDVEN 2.47* 4.31* 2.0 2.1*  --   --   --   --   --   --   --   < > = values in this interval not displayed.  Estimated Creatinine Clearance: 53 mL/min (by C-G formula based on Cr of 1.2).    No Known Allergies  Antimicrobials this admission: Azithro 3/26>>3/29 CTX 3/26>>4/2 Vanc 4/2>> Cefepime 4/2>>  Dose adjustments this admission: N/A  Microbiology results: 3/26 Blood - NEG 3/27 Sputum - normal flora 3/27 RVP - NEG 3/26 Urine - NEG 3/27 MRSA - NEG  Thank you for allowing pharmacy to be a part of this patient's care.  Elleanor Guyett, Rande Lawman 06/20/2015 4:35 PM

## 2015-06-21 ENCOUNTER — Inpatient Hospital Stay (HOSPITAL_COMMUNITY): Payer: Medicare Other

## 2015-06-21 DIAGNOSIS — E038 Other specified hypothyroidism: Secondary | ICD-10-CM

## 2015-06-21 LAB — PROTEIN, BODY FLUID: Total protein, fluid: <3 g/dL

## 2015-06-21 LAB — GLUCOSE, CAPILLARY
GLUCOSE-CAPILLARY: 62 mg/dL — AB (ref 65–99)
GLUCOSE-CAPILLARY: 65 mg/dL (ref 65–99)
GLUCOSE-CAPILLARY: 96 mg/dL (ref 65–99)
GLUCOSE-CAPILLARY: 193 mg/dL — AB (ref 65–99)
Glucose-Capillary: 137 mg/dL — ABNORMAL HIGH (ref 65–99)
Glucose-Capillary: 68 mg/dL (ref 65–99)
Glucose-Capillary: 172 mg/dL — ABNORMAL HIGH (ref 65–99)

## 2015-06-21 LAB — GRAM STAIN

## 2015-06-21 LAB — BODY FLUID CELL COUNT WITH DIFFERENTIAL
LYMPHS FL: 90 %
MONOCYTE-MACROPHAGE-SEROUS FLUID: 7 % — AB (ref 50–90)
NEUTROPHIL FLUID: 3 % (ref 0–25)
WBC FLUID: 66 uL (ref 0–1000)

## 2015-06-21 LAB — PROTIME-INR
INR: 2.37 — AB (ref 0.00–1.49)
Prothrombin Time: 25.6 seconds — ABNORMAL HIGH (ref 11.6–15.2)

## 2015-06-21 LAB — GLUCOSE, SEROUS FLUID: GLUCOSE FL: 147 mg/dL

## 2015-06-21 LAB — LACTATE DEHYDROGENASE, PLEURAL OR PERITONEAL FLUID: LD FL: 72 U/L — AB (ref 3–23)

## 2015-06-21 MED ORDER — LIDOCAINE HCL (PF) 1 % IJ SOLN
INTRAMUSCULAR | Status: AC
  Filled 2015-06-21: qty 10

## 2015-06-21 MED ORDER — WARFARIN SODIUM 3 MG PO TABS: 3.0000 mg | ORAL_TABLET | Freq: Once | ORAL | Status: DC

## 2015-06-21 MED ORDER — WARFARIN SODIUM 3 MG PO TABS
3.0000 mg | ORAL_TABLET | Freq: Once | ORAL | Status: AC
  Administered 2015-06-21: 3 mg via ORAL
  Filled 2015-06-21: qty 1

## 2015-06-21 NOTE — Procedures (Signed)
Successful US guided right thoracentesis. Yielded 729m of clear, amber colored fluid. Pt tolerated procedure well. No immediate complications.  Specimen was sent for labs. CXR ordered.  BAscencion DikePA-C 06/21/2015 3:04 PM

## 2015-06-21 NOTE — Progress Notes (Signed)
OT Cancellation Note  Patient Details Name: Justin Garner MRN: [id number] DOB: January 05, 1933   Cancelled Treatment:    Reason Eval/Treat Not Completed: Patient at procedure or test/ unavailable (Will continue to follow.)  Malka So 06/21/2015, 2:18 PM

## 2015-06-21 NOTE — Clinical Social Work Note (Signed)
Clinical Social Work Assessment  Patient Details  Name: Justin Garner MRN: [id number] Date of Birth: [birth year]  Date of referral:  06/21/15               Reason for consult:  Facility Placement                Permission sought to share information with:  Facility Sport and exercise psychologist, Family Supports Permission granted to share information::  Yes, Verbal Permission Granted  Name::     [place]::  [facility] SNFs  Relationship::  Spouse  Contact Information:  [phone]  Housing/Transportation Living arrangements for the past 2 months:  [place] of Information:  Patient, Spouse Patient Interpreter Needed:  None Criminal Activity/Legal Involvement Pertinent to Current Situation/Hospitalization:  No - Comment as needed Significant Relationships:  Adult Children, Other Family Members, Spouse Lives with:  Spouse, Relatives Do you feel safe going back to the place where you live?  No Need for family participation in patient care:  Yes (Comment)  Care giving concerns:  CSW received referral for SNF placement at discharge. CSW spoke with patient and patient's wife at bedside. [name] is uncertain at this time if patient should go to a facility. Patient states he wants to go home. CSW to continue to follow for discharge needs.   Social Worker assessment / plan:  [name] gave CSW permission to send out snf referral but will let CSW know her decision at a later time.  Employment status:  Retired Nurse, adult PT Recommendations:  [place] / Referral to community resources:  [place]  Patient/Family's Response to care:  [name] reported concern that patient does not want to go to a facility.  Patient/Family's Understanding of and Emotional Response to Diagnosis, Current Treatment, and Prognosis:  No questions at this time.  Emotional Assessment Appearance:   Appears stated age Attitude/Demeanor/Rapport:   (Appropriate) Affect (typically observed):  Quiet, Appropriate Orientation:  Oriented to Self, Oriented to Place, Oriented to  Time, Oriented to Situation Alcohol / Substance use:  Not Applicable Psych involvement (Current and /or in the community):  No (Comment)  Discharge Needs  Concerns to be addressed:  Care Coordination Readmission within the last 30 days:  No Current discharge risk:  None Barriers to Discharge:  Continued Medical Work up   [employer], [place] 06/21/2015, 12:30 PM

## 2015-06-21 NOTE — Care Management Note (Addendum)
Case Management Note  Patient Details  Name: CORGAN MORMILE MRN: [id number] Date of Birth: 08-05-1932  Subjective/Objective: Pt admitted for Acute on Chronic Respiratory Failure. Pt is from home with wife. Plan for thoracentesis 06-21-15.                    Action/Plan: CM did speak with pt/ wife in regards to disposition needs. Pt states he would rather d/c to home once stable. CM did discuss home and wife is retired and will be there 24/7 days week. Has additional help from granddaughter, however she does work. Wife still deciding if the plan will be home vs SNF. CSW Percell Locus did speak with family as well and needed to some time to think through options. Choice provided to family. Pt chose [place]. CM did make [place] referral with [facility]. DME referral provided to [facility] for [place]. Will not deliver until definite decision made. Pt may need 02 once stable as well. Pt will need orders if plan continues to be for home.  No further needs from CM at this time.    Expected Discharge Date:  06/17/15               Expected Discharge Plan:  [place]  In-House Referral:  Clinical Social Work  Discharge planning Services  CM Consult  Post Acute Care Choice:  Home Health, Durable Medical Equipment Choice offered to:  Patient, Spouse  DME Arranged:  [place] rolling with seat DME Agency:  [place]. Brenton Grills is aware with [facility]- awaiting to deliver for decision of Home vs SNF. )  HH Arranged:  RN, PT, Nurse's Aide, Social Work, OT [employer] Agency:  [employer] (Referral provided to [place])  Status of Service:  In process, will continue to follow  Medicare Important Message Given:  Yes Date Medicare IM Given:    Medicare IM give by:    Date Additional Medicare IM Given:    Additional Medicare Important Message give by:     If discussed at [place] of Stay Meetings, dates discussed:    Additional Comments:  Bethena Roys,  RN 06/21/2015, 2:14 PM

## 2015-06-21 NOTE — Progress Notes (Signed)
Pt received from thoracentesis. Pt denies pain. Site has a bandaid overtop, no drainage. Wife updated at bedside. Will continue to monitor, call light within reach.   Fritz Pickerel, RN

## 2015-06-21 NOTE — Care Management Important Message (Signed)
Important Message  Patient Details  Name: Justin Garner MRN: [id number] Date of Birth: January 05, 1933   Medicare Important Message Given:  Yes    [name] [place] 06/21/2015, 11:59 AM

## 2015-06-21 NOTE — NC FL2 (Signed)
[place] LEVEL OF CARE SCREENING TOOL     IDENTIFICATION  Patient Name: Justin Garner Birthdate: [birth year] Sex: male Admission Date (Current Location): 06/13/2015  [facility] and [place] Number:  Herbalist and Address:  [facility], [place] [address], [place], [place] [address]      Provider Number: [id number]  Attending Physician Name and Address:  Venetia Maxon Rama, MD  Relative Name and Phone Number:  Caryl Asp, spouse, [phone]    Current Level of Care: Hospital Recommended Level of Care: [place] Prior Approval Number:    Date Approved/Denied:   PASRR Number:    Discharge Plan: SNF    Current Diagnoses: Patient Active Problem List   Diagnosis Date Noted  . Respiratory acidosis   . Chronic obstructive pulmonary disease ([place])   . Chronic diastolic CHF (congestive heart failure) ([place])   . Secondary cardiomyopathy ([place])   . Chronic atrial fibrillation ([place])   . Acute respiratory failure with hypoxia ([place])   . Cardiomyopathy ([place])   . Chronic atrial fibrillation with RVR (HCC)   . Supratherapeutic INR   . Type 2 diabetes mellitus with complication ([place])   . COPD exacerbation ([place])   . Pleural effusion, right   . CAD in native artery   . Hypomagnesemia   . Controlled diabetes mellitus type 2 with complications ([place])   . Acute kidney injury ([place])   . CAP (community acquired pneumonia) 06/13/2015  . Acute on chronic respiratory failure with hypoxia ([place]) 06/13/2015  . Acute on chronic kidney failure-II 06/13/2015  . Hypothyroidism 06/13/2015  . HLD (hyperlipidemia) 06/13/2015  . Chronic systolic CHF (congestive heart failure) ([place])   . Protein-calorie malnutrition, severe ([place]) 12/29/2013  . Hypokalemia 12/28/2013  . 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 Sinobronchitis 10/28/2013  . Acute systolic heart failure ([place]) 08/12/2013  . Lung cancer, upper lobe ([place]) 06/30/2013  . Coronary  atherosclerosis of native coronary artery 05/28/2013  . Essential hypertension, benign 05/28/2013  . Encounter for therapeutic drug monitoring 04/29/2013  . Atrial fibrillation with RVR ([place]) 12/24/2012  . Other pulmonary embolism and infarction 12/24/2012  . COPD (chronic obstructive pulmonary disease) with emphysema ([place]) 04/23/2008    Orientation RESPIRATION BLADDER Height & Weight     Self, Time, Situation, Place  O2 (2L) Continent, External catheter (Condom catheter) Weight: 190 lb 0.6 oz (86.2 kg) Height:  6' (182.9 cm)  BEHAVIORAL SYMPTOMS/MOOD NEUROLOGICAL BOWEL NUTRITION STATUS      Continent Diet (Please see DC summary)  AMBULATORY STATUS COMMUNICATION OF NEEDS Skin   Limited Assist Verbally Normal                       Personal Care Assistance Level of Assistance  Bathing, Feeding, Dressing Bathing Assistance: Limited assistance Feeding assistance: Limited assistance Dressing Assistance: Limited assistance     Functional Limitations Info             SPECIAL CARE FACTORS FREQUENCY  PT (By licensed PT)     PT Frequency: min 3x/week              Contractures      Additional Factors Info  Code Status, Allergies, Insulin Sliding Scale Code Status Info: DNR Allergies Info: NKA   Insulin Sliding Scale Info: insulin aspart (novoLOG) injection 0-15 Units; insulin aspart (novoLOG) injection 0-5 Units;insulin aspart (novoLOG) injection 5 Units;insulin glargine (LANTUS) injection 20 Units  Current Medications (06/21/2015):  This is the current hospital active medication list Current Facility-Administered Medications  Medication Dose Route Frequency Provider Last Rate Last Dose  . acetaminophen (TYLENOL) tablet 650 mg  650 mg Oral Q4H PRN Jeryl Columbia, NP   650 mg at 06/15/15 0028  . antiseptic oral rinse (CPC / CETYLPYRIDINIUM CHLORIDE 0.05%) solution 7 mL  7 mL Mouth Rinse BID Ivor Costa, MD   7 mL at 06/20/15 2243  . atorvastatin (LIPITOR)  tablet 20 mg  20 mg Oral q morning - 10a Ivor Costa, MD   20 mg at 06/21/15 1155  . ceFEPIme (MAXIPIME) 1 g in dextrose 5 % 50 mL IVPB  1 g Intravenous Q8H Rachel L Rumbarger, RPH   1 g at 06/21/15 0841  . dextromethorphan-guaiFENesin (MUCINEX DM) 30-600 MG per 12 hr tablet 1 tablet  1 tablet Oral BID Ivor Costa, MD   1 tablet at 06/21/15 1155  . diltiazem (CARDIZEM CD) 24 hr capsule 240 mg  240 mg Oral Daily Cherene Altes, MD   240 mg at 06/21/15 0603  . insulin aspart (novoLOG) injection 0-15 Units  0-15 Units Subcutaneous TID WC Venetia Maxon Rama, MD   2 Units at 06/21/15 0753  . insulin aspart (novoLOG) injection 0-5 Units  0-5 Units Subcutaneous QHS Venetia Maxon Rama, MD   0 Units at 06/19/15 2155  . insulin aspart (novoLOG) injection 5 Units  5 Units Subcutaneous TID WC Venetia Maxon Rama, MD   5 Units at 06/21/15 0754  . insulin glargine (LANTUS) injection 20 Units  20 Units Subcutaneous QHS Venetia Maxon Rama, MD   20 Units at 06/20/15 2237  . ipratropium (ATROVENT) nebulizer solution 0.5 mg  0.5 mg Nebulization Q4H PRN Cherene Altes, MD      . levalbuterol Penne Lash) nebulizer solution 0.63 mg  0.63 mg Nebulization Q4H PRN Cherene Altes, MD      . levothyroxine (SYNTHROID, LEVOTHROID) tablet 112 mcg  112 mcg Oral QAC breakfast Ivor Costa, MD   112 mcg at 06/21/15 0603  . metoprolol tartrate (LOPRESSOR) tablet 12.5 mg  12.5 mg Oral BID Allie Bossier, MD   12.5 mg at 06/21/15 1155  . nitroGLYCERIN (NITROSTAT) SL tablet 0.4 mg  0.4 mg Sublingual Q5 min PRN Ivor Costa, MD      . omega-3 acid ethyl esters (LOVAZA) capsule 1 g  1 g Oral Daily Ivor Costa, MD   1 g at 06/20/15 0958  . pantoprazole (PROTONIX) EC tablet 40 mg  40 mg Oral Daily Ivor Costa, MD   40 mg at 06/21/15 1155  . vancomycin (VANCOCIN) IVPB 750 mg/150 ml premix  750 mg Intravenous Q12H Rachel L Rumbarger, RPH   750 mg at 06/21/15 0603  . warfarin (COUMADIN) tablet 3 mg  3 mg Oral ONCE-1800 Kris Mouton, [facility]      . Warfarin -  Pharmacist Dosing Inpatient   Does not apply [place], [facility]         Discharge Medications: Please see discharge summary for a list of discharge medications.  Relevant Imaging Results:  Relevant Lab Results:   Additional Information SSN: [place], [place]

## 2015-06-21 NOTE — Progress Notes (Signed)
Progress Note   Justin Garner EQA:[id number] DOB: [birth year] DOA: 06/13/2015 PCP: Kandice Hams, MD   Brief Narrative:   Justin Garner is an 82 y.o. male with Hx of hypertension, hyperlipidemia, COPD, GERD, hypothyroidism, CAD, pulmonary embolism, PAF on Coumadin, systolic congestive heart failure with EF 45%, chronic kidney disease stage II, right upper lobe lung cancer (S/P surgery, radiation therapy and chemotherapy, followed up with oncologist for 5 years, currently not seeing oncologist), who presented with productive cough, fever and shortness of breath. In the ED, patient was found to have WBC 11.7, lactate of 2.47, temperature 102.4, tachycardia, tachypnea, positive urinalysis, potassium 2.9, worsening renal function. Oxygen desaturation to 89% on room air. Chest x-ray showed chronic changes similar to that noted on the prior exam, no acute infiltrate.  Assessment/Plan:   Principal Problem: Acute on chronic respiratory failure with hypoxia (HCC)/Probable community-acquired pneumonia/small right pleural effusion Chest x-ray on admission negative for infiltrate, but suspicion of community-acquired pneumonia persists. There may also be contributions from COPD/CHF. Both influenza panel and respiratory virus panels were negative. Blood cultures were negative. Follow-up chest x-ray noted worsening bilateral infiltrates. Sputum cultures grew normal oral pharyngeal flora. Dropped his oxygen saturation to 86% when ambulating with PT. Given persistent/rising leukocytosis, and lack of clinical improvement, a CT chest was done 06/20/15 2 r/o empyema/abscess. Moderate pleural effusion on the right noted. Will obtain thoracentesis to rule out empyema. Antibiotics broadened to Cefepime and Vancomycin 06/19/25.  If no improvement with change in antibiotics and thoracentesis, will need ID consult.  Active Problems: COPD (chronic obstructive pulmonary disease) with emphysema (HCC)/COPD  exacerbation Completed a course of steroids. Continue bronchodilators as needed.  Atrial fibrillation with RVR (HCC)/chronic atrial fibrillation CHA2DS2-VASc Score is 5. Continue metoprolol for rate control. Coumadin resumed. INR 1.91.  Coronary atherosclerosis of native coronary artery Stable. Continue Lipitor.  Essential hypertension, benign Blood pressure controlled.  Lung cancer, upper lobe ([place]) Status post VATS, thoracotomy in 2009.  Hypokalemia Potassium repleted. Remains WNL.  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 Inadequate glycemic control noted with steroids. CBGs [id number] despite discontinuation of steroids. Glycemic control improved with moderate scale SSI, 5 units of meal coverage and 20 units of Lantus. Diabetes coordinator following.  Chronic systolic CHF (congestive heart failure) ([place]) /cardiomyopathy 2-D echo done 07/18/13 showed EF 45%. Repeat echo done 06/16/15 with EF 50-55 percent. Patient remains at baseline weight. Creatinine remains stable. Would continue to hold further diuretics unless there is evidence of decompensation.  Acute on chronic kidney failure-II/acute kidney injury Creatinine stable. Hold further diuretics.  Hypothyroidism Continue Synthroid.  HLD (hyperlipidemia) Continue Lipitor.  CAD in native artery Troponins cycled. 0.03, 0.04, 0.03.  Hypomagnesemia Resolved with supplementation.  DVT Prophylaxis Chronically anticoagulated with Coumadin.   Family Communication/Anticipated D/C date and plan/Code Status   Family Communication: Wife updated at the bedside. Disposition Plan/date: From home with wife. PT recommends home health PT and 24 hour supervision. Code Status: DO NOT RESUSCITATE   Procedures and diagnostic studies:   Dg Chest 2 View  06/13/2015  CLINICAL DATA:  Cough and fever for 3 days, initial encounter EXAM: CHEST  2 VIEW COMPARISON:  08/21/2014  FINDINGS: Cardiac shadow is stable. The lungs are well aerated bilaterally and demonstrate some chronic changes similar to that seen on the prior exam. No focal infiltrate or sizable effusion is noted. No acute bony abnormality is seen. IMPRESSION: Chronic changes similar to that noted  on the prior exam. No acute infiltrate is seen. Electronically Signed   By: Inez Catalina M.D.   On: 06/13/2015 19:32   Ct Chest W Contrast  06/20/2015  CLINICAL DATA:  Leukocytosis. History of right lung cancer post lumpectomy. EXAM: CT CHEST WITH CONTRAST TECHNIQUE: Multidetector CT imaging of the chest was performed during intravenous contrast administration. CONTRAST:  1 ISOVUE-300 IOPAMIDOL (ISOVUE-300) INJECTION 61% COMPARISON:  06/24/2013 and 05/26/2013 FINDINGS: Evidence of previous right upper lobectomy. Examination demonstrates moderate emphysematous change with bullous emphysematous disease over the left apex and right middle lobe unchanged. Stable small to moderate right pleural effusion. There is associated mixed interstitial airspace opacification over the right lower lobe which may be due to atelectasis or infection and less likely lymphangitic progression of disease. Patchy opacification over the left lower lobe and minimally in the central left midlung. Airways are within normal. Mild stable cardiomegaly. Calcified plaque over the coronary arteries. There is calcified plaque over the thoracic aorta. Few small mediastinal lymph nodes slightly improved. No significant hilar adenopathy. No evidence of axillary adenopathy. Remaining mediastinal structures are within normal. Images through the upper abdomen demonstrate evidence of previous cholecystectomy. Remainder the exam is unchanged. IMPRESSION: Postsurgical change compatible with previous right upper lobectomy for lung cancer. Stable small to moderate right pleural effusion. There is mixed interstitial airspace density over the right lower lobe and to lesser extent  the posterior left lobe lower lobe and left midlung which may be due to infection and less likely atelectasis or lymphangitic spread of patient's underlying neoplasm. No significant adenopathy. Moderate emphysematous disease with bullous disease over the right middle lobe and left apex unchanged. Mild cardiomegaly with 3 vessel atherosclerotic coronary artery disease. Electronically Signed   By: Marin Olp M.D.   On: 06/20/2015 15:51   Dg Chest Port 1 View  06/15/2015  CLINICAL DATA:  Community-acquired pneumonia, history of COPD, lung malignancy EXAM: PORTABLE CHEST 1 VIEW COMPARISON:  PA and lateral chest x-ray of June 13, 2015 FINDINGS: The lungs are adequately inflated. The interstitial markings have become more conspicuous bilaterally. The cardiac silhouette is top-normal in size. The central pulmonary vascularity is prominent. The mediastinum is normal in width. There is a pleural effusion on the right. The left lateral costophrenic angle is excluded from the study. The bony thorax is unremarkable where visualized. IMPRESSION: The study is limited by patient positioning. Worsening of bilateral interstitial infiltrates has occurred. A small right pleural effusion is present and more conspicuous than on the earlier study. Electronically Signed   By: David  Martinique M.D.   On: 06/15/2015 07:23    Medical Consultants:    None.  Anti-Infectives:   Azithro 3/26 > 3/29 Rocephin 3/26 > 4/2 Cefepime/Vanc 4/2 >  Subjective:   Justin Garner continues to be weak with a moist cough.  Continues to sleep much of the day.  Eating OK.  Continues to drop his sats when ambulating with PT.  Objective:    Filed Vitals:   06/20/15 2115 06/20/15 2237 06/21/15 0142 06/21/15 0517  BP: 121/78 126/98  144/93  Pulse: 49 97  88  Temp: 97.7 F (36.5 C)   97.5 F (36.4 C)  TempSrc: Oral   Oral  Resp: 20   20  Height:      Weight:   86.2 kg (190 lb 0.6 oz)   SpO2: 94%   95%    Intake/Output Summary  (Last 24 hours) at 06/21/15 0804 Last data filed at 06/21/15 [phone]  Gross per 24 hour  Intake    200 ml  Output    950 ml  Net   -750 ml   Filed Weights   06/19/15 0500 06/20/15 1540 06/21/15 0142  Weight: 84.868 kg (187 lb 1.6 oz) 84.7 kg (186 lb 11.7 oz) 86.2 kg (190 lb 0.6 oz)    Exam: Gen:  Weak elderly male in no acute distress Cardiovascular:  RRR, No M/R/G Respiratory:  Lungs with bilateral rhonchi Gastrointestinal:  Abdomen soft, NT/ND, + BS Extremities:  No C/E/C   Data Reviewed:    Labs: Basic Metabolic Panel:  Recent Labs Lab 06/15/15 0748 06/16/15 0250 06/17/15 0439 06/18/15 0742 06/19/15 0235 06/20/15 0227  NA  --  140 138 134* 134* 135  K  --  3.4* 3.7 4.4 4.5 4.5  CL  --  104 102 96* 96* 98*  CO2  --  '26 26 29 27 29  '$ GLUCOSE  --  227* 309* 302* 340* 247*  BUN  --  24* 28* 36* 45* 42*  CREATININE  --  1.19 1.24 1.30* 1.21 1.20  CALCIUM  --  8.7* 8.8* 8.4* 8.5* 8.3*  MG 1.7 2.0 1.6* 1.5* 1.9  --   PHOS  --   --   --   --  3.4  --    GFR Estimated Creatinine Clearance: 53 mL/min (by C-G formula based on Cr of 1.2). Liver Function Tests:  Recent Labs Lab 06/15/15 0519 06/16/15 0250 06/17/15 0439 06/18/15 0742 06/19/15 0235  AST 28 29 32 35  --   ALT '21 23 28 '$ 38  --   ALKPHOS 142* 139* 136* 131*  --   BILITOT 1.9* 1.3* 1.0 1.4*  --   PROT 6.1* 5.9* 5.7* 5.4*  --   ALBUMIN 2.7* 2.6* 2.4* 2.4* 2.4*   Coagulation profile  Recent Labs Lab 06/17/15 2001 06/18/15 0308 06/19/15 0235 06/20/15 0227 06/21/15 0438  INR 2.19* 1.69* 1.59* 1.91* 2.37*    CBC:  Recent Labs Lab 06/15/15 0519 06/16/15 0250 06/17/15 0439 06/18/15 0742 06/20/15 0227  WBC 15.4* 13.8* 19.3* 21.0* 24.2*  NEUTROABS  --  12.3*  --  18.8* 21.2*  HGB 14.8 14.7 15.0 15.8 16.0  HCT 44.7 43.9 44.3 45.4 47.5  MCV 89.8 89.0 89.0 88.0 90.0  PLT 274 327 378 424* 424*   Cardiac Enzymes:  Recent Labs Lab 06/14/15 1049  TROPONINI 0.03   CBG:  Recent Labs Lab  06/20/15 0620 06/20/15 1136 06/20/15 1559 06/20/15 2113 06/21/15 0614  GLUCAP 222* 161* 104* 150* 137*   Sepsis Labs:  Recent Labs Lab 06/16/15 0250 06/17/15 0439 06/18/15 0742 06/20/15 0227  WBC 13.8* 19.3* 21.0* 24.2*   Microbiology Recent Results (from the past 240 hour(s))  Urine culture     Status: None   Collection Time: 06/13/15  7:21 PM  Result Value Ref Range Status   Specimen Description URINE, RANDOM  Final   Special Requests NONE  Final   Culture NO GROWTH 1 DAY  Final   Report Status 06/14/2015 FINAL  Final  Culture, blood (routine x 2)     Status: None   Collection Time: 06/13/15  7:30 PM  Result Value Ref Range Status   Specimen Description BLOOD FOREARM  Final   Special Requests BOTTLES DRAWN AEROBIC AND ANAEROBIC 5CC  Final   Culture NO GROWTH 5 DAYS  Final   Report Status 06/18/2015 FINAL  Final  Culture, blood (routine x 2)     Status: None  Collection Time: 06/13/15  7:43 PM  Result Value Ref Range Status   Specimen Description BLOOD RIGHT ANTECUBITAL  Final   Special Requests BOTTLES DRAWN AEROBIC AND ANAEROBIC 5CC  Final   Culture NO GROWTH 5 DAYS  Final   Report Status 06/18/2015 FINAL  Final  Respiratory virus panel     Status: None   Collection Time: 06/14/15 12:42 AM  Result Value Ref Range Status   Source - RVPAN NASAL SWAB  Corrected   Respiratory Syncytial Virus A Negative Negative Final   Respiratory Syncytial Virus B Negative Negative Final   Influenza A Negative Negative Final   Influenza B Negative Negative Final   Parainfluenza 1 Negative Negative Final   Parainfluenza 2 Negative Negative Final   Parainfluenza 3 Negative Negative Final   Metapneumovirus Negative Negative Final   Rhinovirus Negative Negative Final   Adenovirus Negative Negative Final    Comment: (NOTE) Performed At: [facility] [place], [place] [id number] Lindon Romp MD OE:[id number]   MRSA PCR Screening     Status: None    Collection Time: 06/14/15 12:52 AM  Result Value Ref Range Status   MRSA by PCR NEGATIVE NEGATIVE Final    Comment:        The GeneXpert MRSA Assay (FDA approved for NASAL specimens only), is one component of a comprehensive MRSA colonization surveillance program. It is not intended to diagnose MRSA infection nor to guide or monitor treatment for MRSA infections.   Culture, sputum-assessment     Status: None   Collection Time: 06/14/15  4:45 AM  Result Value Ref Range Status   Specimen Description SPUTUM  Final   Special Requests NONE  Final   Sputum evaluation THIS SPECIMEN IS ACCEPTABLE FOR SPUTUM CULTURE  Final   Report Status 06/14/2015 FINAL  Final  Culture, respiratory (NON-Expectorated)     Status: None   Collection Time: 06/14/15  4:45 AM  Result Value Ref Range Status   Specimen Description SPUTUM  Final   Special Requests NONE  Final   Gram Stain   Final    ABUNDANT WBC PRESENT,BOTH PMN AND MONONUCLEAR MODERATE SQUAMOUS EPITHELIAL CELLS PRESENT ABUNDANT GRAM POSITIVE COCCI IN PAIRS IN CLUSTERS MODERATE GRAM NEGATIVE RODS THIS SPECIMEN IS ACCEPTABLE FOR SPUTUM CULTURE Performed at [facility]    Culture   Final    NORMAL OROPHARYNGEAL FLORA Performed at [facility]    Report Status 06/16/2015 FINAL  Final     Medications:   . antiseptic oral rinse  7 mL Mouth Rinse BID  . atorvastatin  20 mg Oral q morning - 10a  . ceFEPime (MAXIPIME) IV  1 g Intravenous Q8H  . dextromethorphan-guaiFENesin  1 tablet Oral BID  . diltiazem  240 mg Oral Daily  . insulin aspart  0-15 Units Subcutaneous TID WC  . insulin aspart  0-5 Units Subcutaneous QHS  . insulin aspart  5 Units Subcutaneous TID WC  . insulin glargine  20 Units Subcutaneous QHS  . levothyroxine  112 mcg Oral QAC breakfast  . metoprolol tartrate  12.5 mg Oral BID  . omega-3 acid ethyl esters  1 g Oral Daily  . pantoprazole  40 mg Oral Daily  . vancomycin  750 mg Intravenous Q12H  .  Warfarin - Pharmacist Dosing Inpatient   Does not apply q1800   Continuous Infusions:   Time spent: 35 minutes.  The patient is medically complex with multiple co-morbidities and is at high risk for  clinical deterioration and requires high complexity decision making. I spent > 50% of this time speaking to the patient and his wife about the plan of care.   LOS: 8 days   [facility] Pager [phone]. If unable to reach me by pager, please call my cell phone at [phone].  *Please refer to [email or web address], password [email or web address] to get updated schedule on who will round on this patient, as hospitalists switch teams weekly. If 7PM-7AM, please contact night-coverage at [email or web address], password [email or web address] for any overnight needs.  06/21/2015, 8:04 AM

## 2015-06-21 NOTE — Progress Notes (Addendum)
Alerted by Remo Lipps nurse tech that pt's 11:13 CBG was 68. Given OJ and graham crackers and PB.   Pt 11:48 CBG 62. Pt 11:50 CBG 65.  Lunch tray at bedside. Pt wife assisting pt to each lunch. Encouraged to start with dessert. Will recheck CBG after pt has eaten.   Pt CBG 96. Held lunchtime meal coverage and sliding scale. Pt given Sprite and encouraged fluids. Will continue to monitor.  Fritz Pickerel, RN

## 2015-06-21 NOTE — Progress Notes (Signed)
ANTICOAGULATION CONSULT NOTE  Pharmacy Consult for Coumadin Indication: atrial fibrillation  No Known Allergies  Patient Measurements: Height: 6' (182.9 cm) Weight: 190 lb 0.6 oz (86.2 kg) IBW/kg (Calculated) : 77.6  Vital Signs: Temp: 97.5 F (36.4 C) (04/03 0517) Temp Source: Oral (04/03 0517) BP: 144/93 mmHg (04/03 0517) Pulse Rate: 88 (04/03 0517)  Labs:  Recent Labs  06/19/15 0235 06/20/15 0227 06/21/15 0438  HGB  --  16.0  --   HCT  --  47.5  --   PLT  --  424*  --   LABPROT 19.0* 21.8* 25.6*  INR 1.59* 1.91* 2.37*  CREATININE 1.21 1.20  --     Estimated Creatinine Clearance: 53 mL/min (by C-G formula based on Cr of 1.2).  Assessment: 82 y.o. M presents with cough, fever, and SOB. On warfarin PTA for afib. Recently (3/30) INR was ~ 6.0 and patient was given IV vitamin K.  Warfarin resumed 3/31and INR with trend up to 2.37.  Noted concern for HCAP on cefepime and vancomycin   Home regimen: Warfarin 7.'5mg'$  daily exc for '5mg'$  on Thurs PTA for afib. Admit INR therapeutic on home dose.   Goal of Therapy:  INR 2-3 Monitor platelets by anticoagulation protocol: Yes   Plan:  Warfarin '3mg'$  po x 1 dose (due to INR trend up) Daily INR  Hildred Laser, Pharm D 06/21/2015 10:03 AM

## 2015-06-21 NOTE — Progress Notes (Signed)
Physical Therapy Treatment Patient Details Name: Justin Garner MRN: [id number] DOB: [birth year] Today's Date: 06/21/2015    History of Present Illness Justin Garner is a 82 y.o. male with PMH of hypertension, hyperlipidemia, COPD, GERD, hypothyroidism, CAD, pulmonary embolism, PAF on Coumadin, systolic congestive heart failure with EF 45%, chronic kidney disease-stage II, history of right upper lobe lung cancer (S/P surgery, radiation therapy and chemotherapy, followed up with oncologist for 5 years, currently not seeing oncologist), who presents with productive cough, fever and shortness of breath.    PT Comments    Patient able to increase distance with ambulation, but did not tolerate and dropped SpO2 on RA to 71% with c/o fatigue; wheeled back to room and applied O2 with SpO2 recovery to 98%, but remained fatigued and assisted to bed.  Feel may now need to consider post acute rehab in STSNF setting if unable to progress quicker to allow d/c home with wife assist.  Follow Up Recommendations  SNF     Equipment Recommendations  Rolling walker with 5" wheels    Recommendations for Other Services       Precautions / Restrictions Precautions Precautions: Fall Precaution Comments: watch SpO2 and HR Restrictions Weight Bearing Restrictions: No    Mobility  Bed Mobility         Supine to sit: Supervision     General bed mobility comments: increased time  Transfers Overall transfer level: Needs assistance Equipment used: Rolling walker (2 wheeled) Transfers: Sit to/from Stand Sit to Stand: Min assist         General transfer comment: long legs so bed at normal height seems low  Ambulation/Gait Ambulation/Gait assistance: Min guard;Min assist Ambulation Distance (Feet): 85 Feet Assistive device: Rolling walker (2 wheeled) Gait Pattern/deviations: Step-through pattern;Decreased stride length;Shuffle;Trunk flexed     General Gait Details: cues for step length,  but unable to step longer despite long legs; slowed way down in hallway and reported feeling very tired; nurse tech obtained w/c and noted SpO2 dropped on RA to 71%, placed O2 at 3L and pt sat in chair with recovery of SpO2 to 98% with RR 32; however pt remained weak and tired, RN in to check vitals, etc/   Stairs            Wheelchair Mobility    Modified Rankin (Stroke Patients Only)       Balance     Sitting balance-Leahy Scale: Fair     Standing balance support: Bilateral upper extremity supported Standing balance-Leahy Scale: Poor Standing balance comment: UE support for balance                    Cognition Arousal/Alertness: Awake/alert Behavior During Therapy: Flat affect Overall Cognitive Status: Within Functional Limits for tasks assessed                      Exercises      General Comments        Pertinent Vitals/Pain Pain Assessment: No/denies pain    Home Living                      Prior Function            PT Goals (current goals can now be found in the care plan section) Progress towards PT goals: Not progressing toward goals - comment (attempted to increase ambulation, but pt did not tolerate)    Frequency  Min 3X/week    PT  Plan Discharge plan needs to be updated    Co-evaluation             End of Session Equipment Utilized During Treatment: Gait belt;Oxygen Activity Tolerance: Patient limited by fatigue Patient left: in bed;with call bell/phone within reach;with nursing/sitter in room     Time: 4825-0037 PT Time Calculation (min) (ACUTE ONLY): 30 min  Charges:  $Gait Training: 8-22 mins $Therapeutic Activity: 8-22 mins                    G Codes:      Reginia Naas 27-Jun-2015, 10:24 AM  Magda Kiel, Effie 2015-06-27

## 2015-06-22 DIAGNOSIS — I10 Essential (primary) hypertension: Secondary | ICD-10-CM

## 2015-06-22 DIAGNOSIS — J948 Other specified pleural conditions: Secondary | ICD-10-CM

## 2015-06-22 LAB — CBC
HCT: 48.4 % (ref 39.0–52.0)
Hemoglobin: 16.1 g/dL (ref 13.0–17.0)
MCH: 29.5 pg (ref 26.0–34.0)
MCHC: 33.3 g/dL (ref 30.0–36.0)
MCV: 88.8 fL (ref 78.0–100.0)
PLATELETS: 334 10*3/uL (ref 150–400)
RBC: 5.45 MIL/uL (ref 4.22–5.81)
RDW: 15.1 % (ref 11.5–15.5)
WBC: 19.9 10*3/uL — AB (ref 4.0–10.5)

## 2015-06-22 LAB — GLUCOSE, CAPILLARY
GLUCOSE-CAPILLARY: 140 mg/dL — AB (ref 65–99)
GLUCOSE-CAPILLARY: 170 mg/dL — AB (ref 65–99)
Glucose-Capillary: 124 mg/dL — ABNORMAL HIGH (ref 65–99)
Glucose-Capillary: 132 mg/dL — ABNORMAL HIGH (ref 65–99)

## 2015-06-22 LAB — BASIC METABOLIC PANEL
ANION GAP: 12 (ref 5–15)
BUN: 33 mg/dL — ABNORMAL HIGH (ref 6–20)
CALCIUM: 8.1 mg/dL — AB (ref 8.9–10.3)
CO2: 26 mmol/L (ref 22–32)
Chloride: 98 mmol/L — ABNORMAL LOW (ref 101–111)
Creatinine, Ser: 1.23 mg/dL (ref 0.61–1.24)
GFR calc Af Amer: >60 mL/min (ref >60)
GFR calc non Af Amer: 53 mL/min — ABNORMAL LOW (ref >60)
GLUCOSE: 120 mg/dL — AB (ref 65–99)
POTASSIUM: 4 mmol/L (ref 3.5–5.1)
SODIUM: 136 mmol/L (ref 135–145)

## 2015-06-22 LAB — PROTIME-INR
INR: 2.48 — AB (ref 0.00–1.49)
PROTHROMBIN TIME: 26.5 s — AB (ref 11.6–15.2)

## 2015-06-22 LAB — BRAIN NATRIURETIC PEPTIDE: B Natriuretic Peptide: 101.1 pg/mL — ABNORMAL HIGH (ref 0.0–100.0)

## 2015-06-22 LAB — PATHOLOGIST SMEAR REVIEW: Path Review: REACTIVE

## 2015-06-22 MED ORDER — INSULIN GLARGINE 100 UNIT/ML ~~LOC~~ SOLN
15.0000 [IU] | Freq: Every day | SUBCUTANEOUS | Status: DC
  Administered 2015-06-22 – 2015-06-23 (×2): 15 [IU] via SUBCUTANEOUS
  Filled 2015-06-22 (×3): qty 0.15

## 2015-06-22 MED ORDER — WARFARIN SODIUM 5 MG PO TABS
5.0000 mg | ORAL_TABLET | Freq: Once | ORAL | Status: AC
  Administered 2015-06-22: 5 mg via ORAL
  Filled 2015-06-22: qty 1

## 2015-06-22 NOTE — Progress Notes (Signed)
Call from ultrasound - stated they do not know anything regarding specimen sent to cytology on 06/21/15.

## 2015-06-22 NOTE — Progress Notes (Signed)
[facility] for Coumadin Indication: atrial fibrillation  No Known Allergies  Patient Measurements: Height: 6' (182.9 cm) Weight: 191 lb 12.8 oz (87 kg) IBW/kg (Calculated) : 77.6  Vital Signs: Temp: 97.5 F (36.4 C) (04/04 0254) Temp Source: Oral (04/04 0254) BP: 116/80 mmHg (04/04 0625) Pulse Rate: 74 (04/04 0625)  Labs:  Recent Labs  06/20/15 0227 06/21/15 0438 06/22/15 0244  HGB 16.0  --  16.1  HCT 47.5  --  48.4  PLT 424*  --  334  LABPROT 21.8* 25.6* 26.5*  INR 1.91* 2.37* 2.48*  CREATININE 1.20  --  1.23    Estimated Creatinine Clearance: 51.7 mL/min (by C-G formula based on Cr of 1.23).  Assessment: 82 y.o. M presents with cough, fever, and SOB. On warfarin PTA for afib. Recently (3/30) INR was ~ 6.0 and patient was given IV vitamin K.  Warfarin resumed 3/31and INR with trend up to 2.48.  Noted concern for HCAP on cefepime and vancomycin  -coumadin '3mg'$  given 4/3 due to INR trend up  Home regimen: Warfarin 7.'5mg'$  daily exc for '5mg'$  on Thurs PTA for afib. Admit INR therapeutic on home dose.   Goal of Therapy:  INR 2-3 Monitor platelets by anticoagulation protocol: Yes   Plan:  Warfarin '5mg'$  po x 1 dose Daily INR  Hildred Laser, Pharm D 06/22/2015 9:53 AM

## 2015-06-22 NOTE — Clinical Social Work Placement (Signed)
   CLINICAL SOCIAL WORK PLACEMENT  NOTE  Date:  06/22/2015  Patient Details  Name: NICHOLAI WILLETTE MRN: [id number] Date of Birth: 01/30/1933  Clinical Social Work is seeking post-discharge placement for this patient at the [facility] level of care (*CSW will initial, date and re-position this form in  chart as items are completed):      Patient/family provided with [facility] Work Department's list of facilities offering this level of care within the geographic area requested by the patient (or if unable, by the patient's family).      Patient/family informed of their freedom to choose among providers that offer the needed level of care, that participate in Medicare, Medicaid or managed care program needed by the patient, have an available bed and are willing to accept the patient.      Patient/family informed of [facility]'s ownership interest in [facility] and [facility], as well as of the fact that they are under no obligation to receive care at these facilities.  PASRR submitted to EDS on 06/21/15     PASRR number received on 06/21/15     Existing PASRR number confirmed on       FL2 transmitted to all facilities in geographic area requested by pt/family on 06/21/15     FL2 transmitted to all facilities within larger geographic area on       Patient informed that his/her managed care company has contracts with or will negotiate with certain facilities, including the following:            Patient/family informed of bed offers received.  Patient chooses bed at       Physician recommends and patient chooses bed at      Patient to be transferred to   on  .  Patient to be transferred to facility by       Patient family notified on   of transfer.  Name of family member notified:        PHYSICIAN Please sign FL2     Additional Comment:    _______________________________________________ Benard Halsted, [place] 06/22/2015, 5:59  PM

## 2015-06-22 NOTE — Evaluation (Signed)
Occupational Therapy Evaluation Patient Details Name: Justin Garner MRN: [id number] DOB: 10-06-1932 Today's Date: 06/22/2015    History of Present Illness Justin Garner is a 82 y.o. male with PMH of hypertension, hyperlipidemia, COPD, GERD, hypothyroidism, CAD, pulmonary embolism, PAF on Coumadin, systolic congestive heart failure with EF 45%, chronic kidney disease-stage II, history of right upper lobe lung cancer (S/P surgery, radiation therapy and chemotherapy, followed up with oncologist for 5 years, currently not seeing oncologist), who presents with productive cough, fever and shortness of breath.   Clinical Impression   Pt readily willing to get OOB and participate in grooming and toileting activities. Moves slowly with decreased activity tolerance. Pt will benefit from more intense level of rehab prior to return home than [facility] can offer. Recommending SNF.    Follow Up Recommendations  SNF    Equipment Recommendations  3 in 1 bedside comode    Recommendations for Other Services       Precautions / Restrictions Precautions Precautions: Fall Precaution Comments: watch SpO2 and HR      Mobility Bed Mobility Overal bed mobility: Needs Assistance Bed Mobility: Supine to Sit     Supine to sit: Supervision     General bed mobility comments: increased time  Transfers   Equipment used: Rolling walker (2 wheeled) Transfers: Sit to/from Stand Sit to Stand: Min guard         General transfer comment: from bed, chair and BSC    Balance     Sitting balance-Leahy Scale: Fair       Standing balance-Leahy Scale: Poor Standing balance comment: requires one hand support on walker                            ADL Overall ADL's : Needs assistance/impaired Eating/Feeding: Set up;Bed level   Grooming: Wash/dry hands;Oral care;Wash/dry face;Sitting;Set up           Upper Body Dressing : Minimal assistance;Standing       Toilet Transfer: Min  guard;Ambulation;RW   Toileting- Clothing Manipulation and Hygiene: Maximal assistance;Sit to/from stand       Functional mobility during ADLs: Minimal assistance;Rolling walker General ADL Comments: Pt with minimal verbalizing.     Vision     Perception     Praxis      Pertinent Vitals/Pain Pain Assessment: No/denies pain     Hand Dominance     Extremity/Trunk Assessment             Communication     Cognition Arousal/Alertness: Awake/alert Behavior During Therapy: Flat affect Overall Cognitive Status: Within Functional Limits for tasks assessed                     General Comments       Exercises       Shoulder Instructions      Home Living                                          Prior Functioning/Environment               OT Diagnosis:     OT Problem List:     OT Treatment/Interventions:      OT Goals(Current goals can be found in the care plan section) Acute Rehab OT Goals Patient Stated Goal: to go home Time For Goal  Achievement: 06/29/15 Potential to Achieve Goals: Good  OT Frequency: Min 2X/week   Barriers to D/C:            Co-evaluation              End of Session Equipment Utilized During Treatment: Gait belt;Rolling walker;Oxygen Nurse Communication: Other (comment);Mobility status (aware of BM)  Activity Tolerance: Patient tolerated treatment well Patient left: in chair;with call bell/phone within reach;with chair alarm set   Time: 0920-1000 OT Time Calculation (min): 40 min Charges:  OT General Charges $OT Visit: 1 Procedure OT Treatments $Self Care/Home Management : 38-52 mins G-Codes:    Malka So 06/22/2015, 10:06 AM  [phone]

## 2015-06-22 NOTE — Progress Notes (Addendum)
Ascencion Dike, PA responded to page - explained the specimen sent to cytology r/t thoracentesis yesterday of 4/l3/17 apparently arrived to the lab w/o lab paper work request - this RN was informed this AM by [facility] in cytology. He stated he would contact ultrasound and resolve. Called Lumen Brinlee in cytology - ext. [phone]; informed PA was following up on lab request.

## 2015-06-22 NOTE — Progress Notes (Signed)
Two phone calls this AM from Cytology [name] ext [phone] - states have specimen received 06/21/15 w/o associated paper work. Radiology note Ascencion Dike, PA from 06/21/15 @ 1504 referencing thoracentesis of 798m "sent for labs". Tx paged Dr. RRockne Menghiniwith summary of above requesting advise to resolve.

## 2015-06-22 NOTE — Progress Notes (Signed)
Radiology transfer call to interventional radiology - explained need contact info for Ascencion Dike, PA r/t thoracentesis yesterday - specimen sent to cytology w/o paper work. Informed he is in clinic today - contact phone # = [phone]. Paged this number, awaiting call back.

## 2015-06-22 NOTE — Progress Notes (Signed)
Progress Note   Justin Garner BZJ:[id number] DOB: 08-29-1932 DOA: 06/13/2015 PCP: Kandice Hams, MD   Brief Narrative:   Justin Garner is an 82 y.o. male with Hx of hypertension, hyperlipidemia, COPD, GERD, hypothyroidism, CAD, pulmonary embolism, PAF on Coumadin, systolic congestive heart failure with EF 45%, chronic kidney disease stage II, right upper lobe lung cancer (S/P surgery, radiation therapy and chemotherapy, followed up with oncologist for 5 years, currently not seeing oncologist), who presented with productive cough, fever and shortness of breath. In the ED, patient was found to have WBC 11.7, lactate of 2.47, temperature 102.4, tachycardia, tachypnea, positive urinalysis, potassium 2.9, worsening renal function. Oxygen desaturation to 89% on room air. Chest x-ray showed chronic changes similar to that noted on the prior exam, no acute infiltrate.  Assessment/Plan:   Principal Problem: Acute on chronic respiratory failure with hypoxia (HCC)/Probable community-acquired pneumonia/small right pleural effusion Chest x-ray on admission negative for infiltrate, but suspicion of community-acquired pneumonia persists. There may also be contributions from COPD/CHF. Both influenza panel and respiratory virus panels were negative. Blood cultures were negative. Follow-up chest x-ray noted worsening bilateral infiltrates. Sputum cultures grew normal oral pharyngeal flora. Dropped his oxygen saturation to 86% when ambulating with PT. Given persistent/rising leukocytosis, and lack of clinical improvement, a CT chest was done 06/20/15 to r/o empyema/abscess. Moderate pleural effusion on the right noted. 700 mL thoracentesis done 06/21/15. Pleural fluid studies consistent with an exudate. Antibiotics broadened to Cefepime and Vancomycin 06/19/25.  Follow-up pleural fluid culture. WBC down with switch to broader antibiotics. Patient continues to be very weak.  Active Problems: COPD (chronic  obstructive pulmonary disease) with emphysema (HCC)/COPD exacerbation Completed a course of steroids. Continue bronchodilators as needed.  Atrial fibrillation with RVR (HCC)/chronic atrial fibrillation CHA2DS2-VASc Score is 5. Continue metoprolol for rate control. Coumadin resumed. INR therapeutic.  Coronary atherosclerosis of native coronary artery Stable. Continue Lipitor.  Essential hypertension, benign Blood pressure controlled.  Lung cancer, upper lobe ([place]) Status post VATS, thoracotomy in 2009.  Hypokalemia Potassium repleted. Remains WNL.  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 Inadequate glycemic control noted with steroids. CBGs 65-193. Glycemic control improved with moderate scale SSI, 5 units of meal coverage and 20 units of Lantus. Given low of 65, will decrease Lantus to 15 units. Diabetes coordinator following.  Chronic systolic CHF (congestive heart failure) ([place]) /cardiomyopathy 2-D echo done 07/18/13 showed EF 45%. Repeat echo done 06/16/15 with EF 50-55 percent. Patient remains at baseline weight. Creatinine remains stable. Would continue to hold further diuretics unless there is evidence of decompensation.  Acute on chronic kidney failure-II/acute kidney injury Creatinine stable. Hold further diuretics.  Hypothyroidism Continue Synthroid.  HLD (hyperlipidemia) Continue Lipitor.  CAD in native artery Troponins cycled. 0.03, 0.04, 0.03.  Hypomagnesemia Resolved with supplementation.  DVT Prophylaxis Chronically anticoagulated with Coumadin.   Family Communication/Anticipated D/C date and plan/Code Status   Family Communication: Wife updated at the bedside. Disposition Plan/date: From home with wife. PT recommends home health PT and 24 hour supervision. Code Status: DO NOT RESUSCITATE   Procedures and diagnostic studies:   Dg Chest 1 View  06/21/2015  CLINICAL DATA:  Status post  thoracentesis EXAM: CHEST 1 VIEW COMPARISON:  Chest radiograph June 15, 2015; chest CT June 20, 2015 FINDINGS: There is only small residual pleural effusion on the right following thoracentesis. No pneumothorax. There is fibrotic type change in the lung bases. There is been  interval clearing of interstitial edema since recent studies. There is no new opacity. Heart is mildly enlarged with pulmonary vascularity unremarkable. There is bullous disease in the upper lobe regions near the apices. IMPRESSION: No pneumothorax. Minimal residual pleural effusion. Fibrotic change in the bases. Stable cardiomegaly. Bullous disease in the apices. No frank edema or consolidation. Electronically Signed   By: Lowella Grip III M.D.   On: 06/21/2015 15:25   Dg Chest 2 View  06/13/2015  CLINICAL DATA:  Cough and fever for 3 days, initial encounter EXAM: CHEST  2 VIEW COMPARISON:  08/21/2014 FINDINGS: Cardiac shadow is stable. The lungs are well aerated bilaterally and demonstrate some chronic changes similar to that seen on the prior exam. No focal infiltrate or sizable effusion is noted. No acute bony abnormality is seen. IMPRESSION: Chronic changes similar to that noted on the prior exam. No acute infiltrate is seen. Electronically Signed   By: Inez Catalina M.D.   On: 06/13/2015 19:32   Ct Chest W Contrast  06/20/2015  CLINICAL DATA:  Leukocytosis. History of right lung cancer post lumpectomy. EXAM: CT CHEST WITH CONTRAST TECHNIQUE: Multidetector CT imaging of the chest was performed during intravenous contrast administration. CONTRAST:  1 ISOVUE-300 IOPAMIDOL (ISOVUE-300) INJECTION 61% COMPARISON:  06/24/2013 and 05/26/2013 FINDINGS: Evidence of previous right upper lobectomy. Examination demonstrates moderate emphysematous change with bullous emphysematous disease over the left apex and right middle lobe unchanged. Stable small to moderate right pleural effusion. There is associated mixed interstitial airspace  opacification over the right lower lobe which may be due to atelectasis or infection and less likely lymphangitic progression of disease. Patchy opacification over the left lower lobe and minimally in the central left midlung. Airways are within normal. Mild stable cardiomegaly. Calcified plaque over the coronary arteries. There is calcified plaque over the thoracic aorta. Few small mediastinal lymph nodes slightly improved. No significant hilar adenopathy. No evidence of axillary adenopathy. Remaining mediastinal structures are within normal. Images through the upper abdomen demonstrate evidence of previous cholecystectomy. Remainder the exam is unchanged. IMPRESSION: Postsurgical change compatible with previous right upper lobectomy for lung cancer. Stable small to moderate right pleural effusion. There is mixed interstitial airspace density over the right lower lobe and to lesser extent the posterior left lobe lower lobe and left midlung which may be due to infection and less likely atelectasis or lymphangitic spread of patient's underlying neoplasm. No significant adenopathy. Moderate emphysematous disease with bullous disease over the right middle lobe and left apex unchanged. Mild cardiomegaly with 3 vessel atherosclerotic coronary artery disease. Electronically Signed   By: Marin Olp M.D.   On: 06/20/2015 15:51   Dg Chest Port 1 View  06/15/2015  CLINICAL DATA:  Community-acquired pneumonia, history of COPD, lung malignancy EXAM: PORTABLE CHEST 1 VIEW COMPARISON:  PA and lateral chest x-ray of June 13, 2015 FINDINGS: The lungs are adequately inflated. The interstitial markings have become more conspicuous bilaterally. The cardiac silhouette is top-normal in size. The central pulmonary vascularity is prominent. The mediastinum is normal in width. There is a pleural effusion on the right. The left lateral costophrenic angle is excluded from the study. The bony thorax is unremarkable where visualized.  IMPRESSION: The study is limited by patient positioning. Worsening of bilateral interstitial infiltrates has occurred. A small right pleural effusion is present and more conspicuous than on the earlier study. Electronically Signed   By: David  Martinique M.D.   On: 06/15/2015 07:23   US Thoracentesis Asp Pleural Space W/img Guide  06/21/2015  INDICATION: Pneumonia. Right-sided pleural effusion. Request diagnostic and therapeutic thoracentesis. EXAM: ULTRASOUND GUIDED RIGHT THORACENTESIS MEDICATIONS: None. COMPLICATIONS: None immediate. PROCEDURE: An ultrasound guided thoracentesis was thoroughly discussed with the patient and questions answered. The benefits, risks, alternatives and complications were also discussed. The patient understands and wishes to proceed with the procedure. Written consent was obtained. Ultrasound was performed to localize and mark an adequate pocket of fluid in the right chest. The area was then prepped and draped in the normal sterile fashion. 1% Lidocaine was used for local anesthesia. Under ultrasound guidance a 7 cm, 19 gauge, Yueh catheter was introduced. Thoracentesis was performed. The catheter was removed and a dressing applied. FINDINGS: A total of approximately 700 mL of clear, amber colored fluid was removed. Samples were sent to the laboratory as requested by the clinical team. IMPRESSION: Successful ultrasound guided right thoracentesis yielding 700 mL of pleural fluid. Read by: Ascencion Dike PA-C Electronically Signed   By: Lucrezia Europe M.D.   On: 06/21/2015 15:12    Medical Consultants:    Interventional Radiology  Anti-Infectives:   Azithro 3/26 > 3/29 Rocephin 3/26 > 4/2 Cefepime/Vanc 4/2 >  Subjective:   Justin Garner continues to be weak with a moist cough. No complaints of dyspnea. Continues to sleep much of the day.  Eating OK.    Objective:    Filed Vitals:   06/21/15 2144 06/22/15 0000 06/22/15 0254 06/22/15 0625  BP:   127/80 116/80  Pulse: 99  92 73 74  Temp:   97.5 F (36.4 C)   TempSrc:   Oral   Resp:  18 18   Height:   6' (1.829 m)   Weight:   87 kg (191 lb 12.8 oz)   SpO2:   96% 96%    Intake/Output Summary (Last 24 hours) at 06/22/15 0808 Last data filed at 06/22/15 2355  Gross per 24 hour  Intake    850 ml  Output   2125 ml  Net  -1275 ml   Filed Weights   06/20/15 0638 06/21/15 0142 06/22/15 0254  Weight: 84.7 kg (186 lb 11.7 oz) 86.2 kg (190 lb 0.6 oz) 87 kg (191 lb 12.8 oz)    Exam: Gen:  Weak elderly male in no acute distress Cardiovascular:  RRR, No M/R/G Respiratory:  Lungs with bilateral rhonchi Gastrointestinal:  Abdomen soft, NT/ND, + BS Extremities:  No C/E/C   Data Reviewed:    Labs: Basic Metabolic Panel:  Recent Labs Lab 06/16/15 0250 06/17/15 0439 06/18/15 0742 06/19/15 0235 06/20/15 0227 06/22/15 0244  NA 140 138 134* 134* 135 136  K 3.4* 3.7 4.4 4.5 4.5 4.0  CL 104 102 96* 96* 98* 98*  CO2 '26 26 29 27 29 26  '$ GLUCOSE 227* 309* 302* 340* 247* 120*  BUN 24* 28* 36* 45* 42* 33*  CREATININE 1.19 1.24 1.30* 1.21 1.20 1.23  CALCIUM 8.7* 8.8* 8.4* 8.5* 8.3* 8.1*  MG 2.0 1.6* 1.5* 1.9  --   --   PHOS  --   --   --  3.4  --   --    GFR Estimated Creatinine Clearance: 51.7 mL/min (by C-G formula based on Cr of 1.23). Liver Function Tests:  Recent Labs Lab 06/16/15 0250 06/17/15 0439 06/18/15 0742 06/19/15 0235  AST 29 32 35  --   ALT 23 28 38  --   ALKPHOS 139* 136* 131*  --   BILITOT 1.3* 1.0 1.4*  --   PROT 5.9*  5.7* 5.4*  --   ALBUMIN 2.6* 2.4* 2.4* 2.4*   Coagulation profile  Recent Labs Lab 06/18/15 0308 06/19/15 0235 06/20/15 0227 06/21/15 0438 06/22/15 0244  INR 1.69* 1.59* 1.91* 2.37* 2.48*    CBC:  Recent Labs Lab 06/16/15 0250 06/17/15 0439 06/18/15 0742 06/20/15 0227 06/22/15 0244  WBC 13.8* 19.3* 21.0* 24.2* 19.9*  NEUTROABS 12.3*  --  18.8* 21.2*  --   HGB 14.7 15.0 15.8 16.0 16.1  HCT 43.9 44.3 45.4 47.5 48.4  MCV 89.0 89.0 88.0 90.0  88.8  PLT 327 378 424* 424* 334   Cardiac Enzymes: No results for input(s): CKTOTAL, CKMB, CKMBINDEX, TROPONINI in the last 168 hours. CBG:  Recent Labs Lab 06/21/15 1150 06/21/15 1230 06/21/15 1610 06/21/15 2100 06/22/15 0609  GLUCAP 65 96 172* 193* 124*   Sepsis Labs:  Recent Labs Lab 06/17/15 0439 06/18/15 0742 06/20/15 0227 06/22/15 0244  WBC 19.3* 21.0* 24.2* 19.9*   Microbiology Recent Results (from the past 240 hour(s))  Urine culture     Status: None   Collection Time: 06/13/15  7:21 PM  Result Value Ref Range Status   Specimen Description URINE, RANDOM  Final   Special Requests NONE  Final   Culture NO GROWTH 1 DAY  Final   Report Status 06/14/2015 FINAL  Final  Culture, blood (routine x 2)     Status: None   Collection Time: 06/13/15  7:30 PM  Result Value Ref Range Status   Specimen Description BLOOD FOREARM  Final   Special Requests BOTTLES DRAWN AEROBIC AND ANAEROBIC 5CC  Final   Culture NO GROWTH 5 DAYS  Final   Report Status 06/18/2015 FINAL  Final  Culture, blood (routine x 2)     Status: None   Collection Time: 06/13/15  7:43 PM  Result Value Ref Range Status   Specimen Description BLOOD RIGHT ANTECUBITAL  Final   Special Requests BOTTLES DRAWN AEROBIC AND ANAEROBIC 5CC  Final   Culture NO GROWTH 5 DAYS  Final   Report Status 06/18/2015 FINAL  Final  Respiratory virus panel     Status: None   Collection Time: 06/14/15 12:42 AM  Result Value Ref Range Status   Source - RVPAN NASAL SWAB  Corrected   Respiratory Syncytial Virus A Negative Negative Final   Respiratory Syncytial Virus B Negative Negative Final   Influenza A Negative Negative Final   Influenza B Negative Negative Final   Parainfluenza 1 Negative Negative Final   Parainfluenza 2 Negative Negative Final   Parainfluenza 3 Negative Negative Final   Metapneumovirus Negative Negative Final   Rhinovirus Negative Negative Final   Adenovirus Negative Negative Final    Comment:  (NOTE) Performed At: [facility] [place], [place] [id number] Lindon Romp MD [id number]   MRSA PCR Screening     Status: None   Collection Time: 06/14/15 12:52 AM  Result Value Ref Range Status   MRSA by PCR NEGATIVE NEGATIVE Final    Comment:        The GeneXpert MRSA Assay (FDA approved for NASAL specimens only), is one component of a comprehensive MRSA colonization surveillance program. It is not intended to diagnose MRSA infection nor to guide or monitor treatment for MRSA infections.   Culture, sputum-assessment     Status: None   Collection Time: 06/14/15  4:45 AM  Result Value Ref Range Status   Specimen Description SPUTUM  Final   Special Requests NONE  Final   Sputum  evaluation THIS SPECIMEN IS ACCEPTABLE FOR SPUTUM CULTURE  Final   Report Status 06/14/2015 FINAL  Final  Culture, respiratory (NON-Expectorated)     Status: None   Collection Time: 06/14/15  4:45 AM  Result Value Ref Range Status   Specimen Description SPUTUM  Final   Special Requests NONE  Final   Gram Stain   Final    ABUNDANT WBC PRESENT,BOTH PMN AND MONONUCLEAR MODERATE SQUAMOUS EPITHELIAL CELLS PRESENT ABUNDANT GRAM POSITIVE COCCI IN PAIRS IN CLUSTERS MODERATE GRAM NEGATIVE RODS THIS SPECIMEN IS ACCEPTABLE FOR SPUTUM CULTURE Performed at [facility]    Culture   Final    NORMAL OROPHARYNGEAL FLORA Performed at [facility]    Report Status 06/16/2015 FINAL  Final  Gram stain     Status: None   Collection Time: 06/21/15  3:26 PM  Result Value Ref Range Status   Specimen Description FLUID RIGHT PLEURAL  Final   Special Requests NONE  Final   Gram Stain   Final    FEW WBC PRESENT, PREDOMINANTLY MONONUCLEAR NO ORGANISMS SEEN    Report Status 06/21/2015 FINAL  Final     Medications:   . antiseptic oral rinse  7 mL Mouth Rinse BID  . atorvastatin  20 mg Oral q morning - 10a  . ceFEPime (MAXIPIME) IV  1 g Intravenous Q8H  .  dextromethorphan-guaiFENesin  1 tablet Oral BID  . diltiazem  240 mg Oral Daily  . insulin aspart  0-15 Units Subcutaneous TID WC  . insulin aspart  0-5 Units Subcutaneous QHS  . insulin aspart  5 Units Subcutaneous TID WC  . insulin glargine  20 Units Subcutaneous QHS  . levothyroxine  112 mcg Oral QAC breakfast  . metoprolol tartrate  12.5 mg Oral BID  . omega-3 acid ethyl esters  1 g Oral Daily  . pantoprazole  40 mg Oral Daily  . vancomycin  750 mg Intravenous Q12H  . warfarin  3 mg Oral ONCE-1800  . Warfarin - Pharmacist Dosing Inpatient   Does not apply q1800   Continuous Infusions:   Time spent: 25 minutes   LOS: 9 days   [facility] Pager [phone]. If unable to reach me by pager, please call my cell phone at [phone].  *Please refer to [email or web address], password [email or web address] to get updated schedule on who will round on this patient, as hospitalists switch teams weekly. If 7PM-7AM, please contact night-coverage at [email or web address], password [email or web address] for any overnight needs.  06/22/2015, 8:08 AM

## 2015-06-23 DIAGNOSIS — R918 Other nonspecific abnormal finding of lung field: Secondary | ICD-10-CM

## 2015-06-23 DIAGNOSIS — Z85118 Personal history of other malignant neoplasm of bronchus and lung: Secondary | ICD-10-CM

## 2015-06-23 DIAGNOSIS — J90 Pleural effusion, not elsewhere classified: Secondary | ICD-10-CM | POA: Insufficient documentation

## 2015-06-23 DIAGNOSIS — R634 Abnormal weight loss: Secondary | ICD-10-CM

## 2015-06-23 DIAGNOSIS — D72829 Elevated white blood cell count, unspecified: Secondary | ICD-10-CM

## 2015-06-23 LAB — CBC
HEMATOCRIT: 48.9 % (ref 39.0–52.0)
Hemoglobin: 16.5 g/dL (ref 13.0–17.0)
MCH: 30.6 pg (ref 26.0–34.0)
MCHC: 33.7 g/dL (ref 30.0–36.0)
MCV: 90.7 fL (ref 78.0–100.0)
PLATELETS: 337 10*3/uL (ref 150–400)
RBC: 5.39 MIL/uL (ref 4.22–5.81)
RDW: 15.3 % (ref 11.5–15.5)
WBC: 22.5 10*3/uL — ABNORMAL HIGH (ref 4.0–10.5)

## 2015-06-23 LAB — GLUCOSE, CAPILLARY
GLUCOSE-CAPILLARY: 93 mg/dL (ref 65–99)
Glucose-Capillary: 93 mg/dL (ref 65–99)
Glucose-Capillary: 103 mg/dL — ABNORMAL HIGH (ref 65–99)
Glucose-Capillary: 88 mg/dL (ref 65–99)

## 2015-06-23 LAB — BASIC METABOLIC PANEL
Anion gap: 12 (ref 5–15)
BUN: 30 mg/dL — AB (ref 6–20)
CALCIUM: 8.4 mg/dL — AB (ref 8.9–10.3)
CO2: 30 mmol/L (ref 22–32)
CREATININE: 1.32 mg/dL — AB (ref 0.61–1.24)
Chloride: 95 mmol/L — ABNORMAL LOW (ref 101–111)
GFR calc Af Amer: 57 mL/min — ABNORMAL LOW (ref >60)
GFR, EST NON AFRICAN AMERICAN: 49 mL/min — AB (ref >60)
GLUCOSE: 59 mg/dL — AB (ref 65–99)
Potassium: 4.5 mmol/L (ref 3.5–5.1)
Sodium: 137 mmol/L (ref 135–145)

## 2015-06-23 LAB — PROTIME-INR
INR: 2.33 — AB (ref 0.00–1.49)
PROTHROMBIN TIME: 25.3 s — AB (ref 11.6–15.2)

## 2015-06-23 MED ORDER — TRAZODONE HCL 50 MG PO TABS
50.0000 mg | ORAL_TABLET | Freq: Every day | ORAL | Status: DC
  Administered 2015-06-23 – 2015-06-24 (×2): 50 mg via ORAL
  Filled 2015-06-23 (×2): qty 1

## 2015-06-23 MED ORDER — WARFARIN SODIUM 5 MG PO TABS
5.0000 mg | ORAL_TABLET | Freq: Once | ORAL | Status: AC
  Administered 2015-06-23: 5 mg via ORAL
  Filled 2015-06-23: qty 1

## 2015-06-23 MED ORDER — SODIUM CHLORIDE 0.9 % IV SOLN
INTRAVENOUS | Status: DC
  Administered 2015-06-23: 10:00:00 via INTRAVENOUS
  Administered 2015-06-23: 1000 mL via INTRAVENOUS
  Administered 2015-06-24 – 2015-06-25 (×3): via INTRAVENOUS

## 2015-06-23 NOTE — Consult Note (Addendum)
Date of Admission:  06/13/2015  Date of Consult:  06/23/2015  Reason for Consult: persistent leukocytosis Referring Physician: Dr. Rockne Menghini   HPI: Justin Garner is an 82 y.o. male with history of lung comes cancer status post VATS thoracotomy in 2009 sp XRT, chemotherapy  followed by oncology closely for 5 years, also with COPD, DM, CAD, PAF who was admitted with worsening fever and shortness of breath with a productive cough.  [facility] department he was febrile to 102.4 tachycardic and tachypneic. I do not now why a urinalysis was percent obtained since he had no urinary symptoms but it was. He was hypoxemic to it with a pulse ox of 89% on room air. Chest x-ray showed some chronic changes with no acute infiltrate. He was in atrial fibrillation with a rapid ventricular response as well. He was admitted to stepdown unit and started on antibiotic started yet pneumonia with ceftriaxone and azithromycin. In the interim he had improvement in his fevers but had persistent leukocytosis which acts he worsened during his hospital stay. On April 2 he had a CT scan of the lungs performed which showed:  mixed interstitial airspace density over the right lower lobe and to lesser extent the posterior left lobe lower lobe and left midlung which may be due to infection and less likely atelectasis or lymphangitic spread of patient's underlying neoplasm  He also had a moderate sized pleural effusion which was tapped and found to be a transudate.  His antibiotics were then broadened to cefepime and vancomycin and he was continued on these and has now completed more than 9 days of systemic antibiotics. His white blood cell count does remain elevated at 22,500.  I reviewed his prior white blood cell count available in Epic and he has had a high white count since the fall of October 2015 when sampling only Epic data. Note his primary care doctor is Dr Delfina Redwood and I do NOT have access to is records.  The  patients hx is noted for COPD which gives him a daily cough and is mention for lung cancer that was treated previously. He also has had a nearly 40 pound weight loss over the last year per the patient and the wife. It does not appear to have been addressed by his primary care doctor if I understand things correctly.  Past Medical History  Diagnosis Date  . Diabetes mellitus   . Thyroid disease   . Coronary artery disease   . COPD (chronic obstructive pulmonary disease) ([place])   . GERD (gastroesophageal reflux disease)   . Pulmonary embolism ([place])   . Hyperlipidemia   . FH: colonic polyps   . PAF (paroxysmal atrial fibrillation) ([place])   . Hypertension   . ASCVD (arteriosclerotic cardiovascular disease)   . Cancer ([place])   . Chronic systolic CHF (congestive heart failure) ([place])   . CKD (chronic kidney disease), stage II     Past Surgical History  Procedure Laterality Date  . Cholecystectomy    . Hernia repair    . Carotid stent    . Stent proximal    . Patent stents    . Inguinal hernia repair  10/10/2010  . Fiberoptic bronchoscopy and meiastinoscopy  06/07/2007  . R vats,thoracotomy and upper lobectomy  08/02/2007  . Left heart catheterization with coronary angiogram N/A 08/19/2013    Procedure: LEFT HEART CATHETERIZATION WITH CORONARY ANGIOGRAM;  Surgeon: Jettie Booze, MD;  Location: [facility] CATH LAB;  Service: Cardiovascular;  Laterality: N/A;    Social History:  reports that he quit smoking about 14 years ago. His smoking use included Cigarettes. He has a 50 pack-year smoking history. He has quit using smokeless tobacco. He reports that he does not drink alcohol or use illicit drugs.   Family History  Problem Relation Age of Onset  . Heart disease Father     No Known Allergies   Medications: I have reviewed patients current medications as documented in Epic Anti-infectives    Start     Dose/Rate Route Frequency Ordered Stop   06/21/15 0600  vancomycin (VANCOCIN) IVPB  750 mg/150 ml premix  Status:  Discontinued     750 mg 150 mL/hr over 60 Minutes Intravenous Every 12 hours 06/20/15 1634 06/23/15 1151   06/20/15 1800  vancomycin (VANCOCIN) 1,500 mg in sodium chloride 0.9 % 500 mL IVPB     1,500 mg 250 mL/hr over 120 Minutes Intravenous  Once 06/20/15 1634 06/20/15 1905   06/20/15 1730  ceFEPIme (MAXIPIME) 1 g in dextrose 5 % 50 mL IVPB  Status:  Discontinued     1 g 100 mL/hr over 30 Minutes Intravenous Every 8 hours 06/20/15 1634 06/23/15 1201   06/14/15 2030  azithromycin (ZITHROMAX) 250 mg in dextrose 5 % 125 mL IVPB  Status:  Discontinued     250 mg 125 mL/hr over 60 Minutes Intravenous Every 24 hours 06/13/15 1922 06/16/15 1025   06/14/15 2000  cefTRIAXone (ROCEPHIN) 1 g in dextrose 5 % 50 mL IVPB  Status:  Discontinued     1 g 100 mL/hr over 30 Minutes Intravenous Every 24 hours 06/13/15 1922 06/20/15 1630   06/13/15 2230  cefTRIAXone (ROCEPHIN) 1 g in dextrose 5 % 50 mL IVPB  Status:  Discontinued     1 g 100 mL/hr over 30 Minutes Intravenous Every 24 hours 06/13/15 2229 06/13/15 2232   06/13/15 2230  azithromycin (ZITHROMAX) 500 mg in dextrose 5 % 250 mL IVPB  Status:  Discontinued     500 mg 250 mL/hr over 60 Minutes Intravenous Every 24 hours 06/13/15 2229 06/13/15 2232   06/13/15 1900  cefTRIAXone (ROCEPHIN) 1 g in dextrose 5 % 50 mL IVPB     1 g 100 mL/hr over 30 Minutes Intravenous  Once 06/13/15 1848 06/13/15 2018   06/13/15 1900  azithromycin (ZITHROMAX) 500 mg in dextrose 5 % 250 mL IVPB     500 mg 250 mL/hr over 60 Minutes Intravenous  Once 06/13/15 1848 06/13/15 2116         ROS:  as in HPI otherwise remainder of 12 point Review of Systems is negative  Blood pressure 97/51, pulse 92, temperature 97.6 F (36.4 C), temperature source Oral, resp. rate 17, height 6' (1.829 m), weight 189 lb 2.5 oz (85.8 kg), SpO2 96 %. General: Alert and awake, oriented x3, not in any acute distress. HEENT: anicteric sclera,  EOMI, oropharynx  clear and without exudate Cardiovascular:irr irr normal r,  no murmur rubs or gallops Pulmonary: clear to auscultation bilaterally, no wheezing, rales or rhonchi Gastrointestinal: soft nontender, nondistended, normal bowel sounds, Musculoskeletal: no  clubbing or edema noted bilaterally Skin, soft tissue: few ecchymoses Neuro: nonfocal, strength and sensation intact   Results for orders placed or performed during the hospital encounter of 06/13/15 (from the past 48 hour(s))  Glucose, capillary     Status: Abnormal   Collection Time: 06/21/15  9:00 PM  Result Value Ref Range   Glucose-Capillary 193 (H) 65 -  99 mg/dL  Protime-INR     Status: Abnormal   Collection Time: 06/22/15  2:44 AM  Result Value Ref Range   Prothrombin Time 26.5 (H) 11.6 - 15.2 seconds   INR 2.48 (H) 0.00 - 1.49  CBC     Status: Abnormal   Collection Time: 06/22/15  2:44 AM  Result Value Ref Range   WBC 19.9 (H) 4.0 - 10.5 K/uL   RBC 5.45 4.22 - 5.81 MIL/uL   Hemoglobin 16.1 13.0 - 17.0 g/dL   HCT 48.4 39.0 - 52.0 %   MCV 88.8 78.0 - 100.0 fL   MCH 29.5 26.0 - 34.0 pg   MCHC 33.3 30.0 - 36.0 g/dL   RDW 15.1 11.5 - 15.5 %   Platelets 334 150 - 400 K/uL  Basic metabolic panel     Status: Abnormal   Collection Time: 06/22/15  2:44 AM  Result Value Ref Range   Sodium 136 135 - 145 mmol/L   Potassium 4.0 3.5 - 5.1 mmol/L   Chloride 98 (L) 101 - 111 mmol/L   CO2 26 22 - 32 mmol/L   Glucose, Bld 120 (H) 65 - 99 mg/dL   BUN 33 (H) 6 - 20 mg/dL   Creatinine, Ser 1.23 0.61 - 1.24 mg/dL   Calcium 8.1 (L) 8.9 - 10.3 mg/dL   GFR calc non Af Amer 53 (L) >60 mL/min   GFR calc Af Amer >60 >60 mL/min    Comment: (NOTE) The eGFR has been calculated using the CKD EPI equation. This calculation has not been validated in all clinical situations. eGFR's persistently <60 mL/min signify possible Chronic Kidney Disease.    Anion gap 12 5 - 15  Brain natriuretic peptide     Status: Abnormal   Collection Time: 06/22/15   2:44 AM  Result Value Ref Range   B Natriuretic Peptide 101.1 (H) 0.0 - 100.0 pg/mL  Glucose, capillary     Status: Abnormal   Collection Time: 06/22/15  6:09 AM  Result Value Ref Range   Glucose-Capillary 124 (H) 65 - 99 mg/dL  Glucose, capillary     Status: Abnormal   Collection Time: 06/22/15 11:28 AM  Result Value Ref Range   Glucose-Capillary 140 (H) 65 - 99 mg/dL   Comment 1 Notify RN   Glucose, capillary     Status: Abnormal   Collection Time: 06/22/15  4:29 PM  Result Value Ref Range   Glucose-Capillary 170 (H) 65 - 99 mg/dL   Comment 1 Notify RN    Comment 2 Document in Chart   Glucose, capillary     Status: Abnormal   Collection Time: 06/22/15  9:55 PM  Result Value Ref Range   Glucose-Capillary 132 (H) 65 - 99 mg/dL   Comment 1 Notify RN    Comment 2 Document in Chart   Protime-INR     Status: Abnormal   Collection Time: 06/23/15  3:29 AM  Result Value Ref Range   Prothrombin Time 25.3 (H) 11.6 - 15.2 seconds   INR 2.33 (H) 0.00 - 4.49  Basic metabolic panel     Status: Abnormal   Collection Time: 06/23/15  3:29 AM  Result Value Ref Range   Sodium 137 135 - 145 mmol/L   Potassium 4.5 3.5 - 5.1 mmol/L   Chloride 95 (L) 101 - 111 mmol/L   CO2 30 22 - 32 mmol/L   Glucose, Bld 59 (L) 65 - 99 mg/dL   BUN 30 (H) 6 - 20 mg/dL  Creatinine, Ser 1.32 (H) 0.61 - 1.24 mg/dL   Calcium 8.4 (L) 8.9 - 10.3 mg/dL   GFR calc non Af Amer 49 (L) >60 mL/min   GFR calc Af Amer 57 (L) >60 mL/min    Comment: (NOTE) The eGFR has been calculated using the CKD EPI equation. This calculation has not been validated in all clinical situations. eGFR's persistently <60 mL/min signify possible Chronic Kidney Disease.    Anion gap 12 5 - 15  CBC     Status: Abnormal   Collection Time: 06/23/15  3:29 AM  Result Value Ref Range   WBC 22.5 (H) 4.0 - 10.5 K/uL   RBC 5.39 4.22 - 5.81 MIL/uL   Hemoglobin 16.5 13.0 - 17.0 g/dL   HCT 48.9 39.0 - 52.0 %   MCV 90.7 78.0 - 100.0 fL   MCH 30.6  26.0 - 34.0 pg   MCHC 33.7 30.0 - 36.0 g/dL   RDW 15.3 11.5 - 15.5 %   Platelets 337 150 - 400 K/uL  Glucose, capillary     Status: None   Collection Time: 06/23/15  6:48 AM  Result Value Ref Range   Glucose-Capillary 93 65 - 99 mg/dL  Glucose, capillary     Status: Abnormal   Collection Time: 06/23/15 11:17 AM  Result Value Ref Range   Glucose-Capillary 103 (H) 65 - 99 mg/dL   Comment 1 Notify RN   Glucose, capillary     Status: None   Collection Time: 06/23/15  4:55 PM  Result Value Ref Range   Glucose-Capillary 88 65 - 99 mg/dL   Comment 1 Notify RN    Comment 2 Document in Chart    _0 (sdes,specrequest,cult,reptstatus)   ) Recent Results (from the past 720 hour(s))  Urine culture     Status: None   Collection Time: 06/13/15  7:21 PM  Result Value Ref Range Status   Specimen Description URINE, RANDOM  Final   Special Requests NONE  Final   Culture NO GROWTH 1 DAY  Final   Report Status 06/14/2015 FINAL  Final  Culture, blood (routine x 2)     Status: None   Collection Time: 06/13/15  7:30 PM  Result Value Ref Range Status   Specimen Description BLOOD FOREARM  Final   Special Requests BOTTLES DRAWN AEROBIC AND ANAEROBIC 5CC  Final   Culture NO GROWTH 5 DAYS  Final   Report Status 06/18/2015 FINAL  Final  Culture, blood (routine x 2)     Status: None   Collection Time: 06/13/15  7:43 PM  Result Value Ref Range Status   Specimen Description BLOOD RIGHT ANTECUBITAL  Final   Special Requests BOTTLES DRAWN AEROBIC AND ANAEROBIC 5CC  Final   Culture NO GROWTH 5 DAYS  Final   Report Status 06/18/2015 FINAL  Final  Respiratory virus panel     Status: None   Collection Time: 06/14/15 12:42 AM  Result Value Ref Range Status   Source - RVPAN NASAL SWAB  Corrected   Respiratory Syncytial Virus A Negative Negative Final   Respiratory Syncytial Virus B Negative Negative Final   Influenza A Negative Negative Final   Influenza B Negative Negative Final    Parainfluenza 1 Negative Negative Final   Parainfluenza 2 Negative Negative Final   Parainfluenza 3 Negative Negative Final   Metapneumovirus Negative Negative Final   Rhinovirus Negative Negative Final   Adenovirus Negative Negative Final    Comment: (NOTE) Performed At: [facility] [employer] [address]  [place], [place] [id number] Lindon Romp MD PN:[id number]   MRSA PCR Screening     Status: None   Collection Time: 06/14/15 12:52 AM  Result Value Ref Range Status   MRSA by PCR NEGATIVE NEGATIVE Final    Comment:        The GeneXpert MRSA Assay (FDA approved for NASAL specimens only), is one component of a comprehensive MRSA colonization surveillance program. It is not intended to diagnose MRSA infection nor to guide or monitor treatment for MRSA infections.   Culture, sputum-assessment     Status: None   Collection Time: 06/14/15  4:45 AM  Result Value Ref Range Status   Specimen Description SPUTUM  Final   Special Requests NONE  Final   Sputum evaluation THIS SPECIMEN IS ACCEPTABLE FOR SPUTUM CULTURE  Final   Report Status 06/14/2015 FINAL  Final  Culture, respiratory (NON-Expectorated)     Status: None   Collection Time: 06/14/15  4:45 AM  Result Value Ref Range Status   Specimen Description SPUTUM  Final   Special Requests NONE  Final   Gram Stain   Final    ABUNDANT WBC PRESENT,BOTH PMN AND MONONUCLEAR MODERATE SQUAMOUS EPITHELIAL CELLS PRESENT ABUNDANT GRAM POSITIVE COCCI IN PAIRS IN CLUSTERS MODERATE GRAM NEGATIVE RODS THIS SPECIMEN IS ACCEPTABLE FOR SPUTUM CULTURE Performed at [facility]    Culture   Final    NORMAL OROPHARYNGEAL FLORA Performed at [facility]    Report Status 06/16/2015 FINAL  Final  Culture, body fluid-bottle     Status: None (Preliminary result)   Collection Time: 06/21/15  3:26 PM  Result Value Ref Range Status   Specimen Description FLUID RIGHT PLEURAL  Final   Special Requests BOTTLES DRAWN AEROBIC AND  ANAEROBIC 10CC  Final   Culture NO GROWTH 2 DAYS  Final   Report Status PENDING  Incomplete  Gram stain     Status: None   Collection Time: 06/21/15  3:26 PM  Result Value Ref Range Status   Specimen Description FLUID RIGHT PLEURAL  Final   Special Requests NONE  Final   Gram Stain   Final    FEW WBC PRESENT, PREDOMINANTLY MONONUCLEAR NO ORGANISMS SEEN    Report Status 06/21/2015 FINAL  Final     Impression/Recommendation  Principal Problem:   Acute on chronic respiratory failure with hypoxia (HCC) Active Problems:   COPD (chronic obstructive pulmonary disease) with emphysema (HCC)   Atrial fibrillation with RVR (HCC)   Coronary atherosclerosis of native coronary artery   Essential hypertension, benign   Lung cancer, upper lobe (HCC)   Hypokalemia   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 (HCC)   CAP (community acquired pneumonia)   Chronic systolic CHF (congestive heart failure) (HCC)   Acute on chronic kidney failure-II   Hypothyroidism   HLD (hyperlipidemia)   COPD exacerbation (HCC)   Pleural effusion, right   CAD in native artery   Hypomagnesemia   Controlled diabetes mellitus type 2 with complications ([place])   Acute kidney injury ([place])   Acute respiratory failure with hypoxia (HCC)   Cardiomyopathy (HCC)   Chronic atrial fibrillation with RVR (HCC)   Supratherapeutic INR   Type 2 diabetes mellitus with complication (HCC)   Respiratory acidosis   Chronic obstructive pulmonary disease (HCC)   Chronic diastolic CHF (congestive heart failure) (HCC)   Secondary cardiomyopathy ([place])   Chronic atrial fibrillation ([place])   BEDFORD WINSOR is a 82 y.o. male with  Hx of lung  cancer COPD mx other medical problems admitted with fever cough, clear CXR but concern for CAP sp rx with 9 days of IV abx with persistent leukocytosis. Note his CT scan that was done shows an area that could be consistent with possible lymphangitic spread of prior malignancy though radiology favored other  and these in the differential. He also has a 40 pound weight loss which is significant.  #1 Persistent leukocytosis: this is not due to a CAP or HCAP based on his history exam and radiographic findings. It could be due to return of his lung cancer. It could be due to an occult infection in his abdomen.   --we will DC abx --if fever returns would get CT abdomen with IV and oral contrast to look for infection there --he likely needs a PET scan to workup the findings on CT scan --it would be helfpul to have access to Dr Polite's labs for comparison re his WBC trend over past 2 years     06/23/2015, 5:05 PM   Thank you so much for this interesting consult  [place] [facility] [place] [phone] (pager) [phone] (office) 06/23/2015, 5:05 PM  Rhina Br[place] 06/23/2015, 5:05 PM

## 2015-06-23 NOTE — Progress Notes (Signed)
[facility] for Coumadin, vancomycin, cefepime Indication: atrial fibrillation  No Known Allergies  Patient Measurements: Height: 6' (182.9 cm) Weight: 189 lb 2.5 oz (85.8 kg) IBW/kg (Calculated) : 77.6  Vital Signs: Temp: 97.4 F (36.3 C) (04/05 0430) Temp Source: Oral (04/05 0430) BP: 108/71 mmHg (04/05 0430) Pulse Rate: 95 (04/05 0430)  Labs:  Recent Labs  06/21/15 0438 06/22/15 0244 06/23/15 0329  HGB  --  16.1 16.5  HCT  --  48.4 48.9  PLT  --  334 337  LABPROT 25.6* 26.5* 25.3*  INR 2.37* 2.48* 2.33*  CREATININE  --  1.23 1.32*    Estimated Creatinine Clearance: 48.2 mL/min (by C-G formula based on Cr of 1.32).  Assessment: 82 y.o. M presents with cough, fever, and SOB.   Anticoagulation On warfarin PTA for afib. Recently (3/30) INR was ~ 6.0 and patient was given IV vitamin K.  Warfarin resumed 3/31and INR today is 2.33 (at goal)  Home regimen: Warfarin 7.'5mg'$  daily exc for '5mg'$  on Thurs  ID He is also noted with concern of PNA on vancomycin and cefepime (day 4) . WBC= 12.5 (up), afebrile, SCr= 1.32 (up), CrCl ~ 50  Cefepime 4/2>> Vanc 4/2>> Azithromycin 3/26 >>3/29 Ceftriaxone 3/26 >> 4/2  Pleural fluid 4/3> ngtd Resp cx 3/27 > nl flora F Blood cx 3/26 > ngF Urine cx 3/26 > ngF MRSA PCR negative  Goal of Therapy:  INR 2-3 Monitor platelets by anticoagulation protocol: Yes  Vancomycin level= 15-20   Plan:  Warfarin '5mg'$  po x 1 dose Daily INR No cefepime dose changes needed Could consider d/ vancomycin soon  Hildred Laser, Pharm D 06/23/2015 10:56 AM

## 2015-06-23 NOTE — Clinical Social Work Note (Signed)
CSW met with the patient and patient's wife at bedside to see if they had determine whether the patient would return home at DC or DC to SNF. The patient and wife state that the patient still wants to return home at discharge at this time. CSW provided the patient and wife with available bed offers. The patient and wife state that [facility] would also be close to their home so CSW has made referrals to the [facility] facilities. CSW will followup with any bed offers received from these facilities. Unclear if patient and family will change their mind at this time but family has been informed that decision would need to be made soon so that information could be given to the insurance company for authorization.   Liz Beach MSW, [place], [place], [phone]

## 2015-06-23 NOTE — Progress Notes (Signed)
Progress Note   DAVIDSON PALMIERI LEX:[id number] DOB: 08/13/1932 DOA: 06/13/2015 PCP: Kandice Hams, MD   Brief Narrative:   Justin Garner is an 82 y.o. male with Hx of hypertension, hyperlipidemia, COPD, GERD, hypothyroidism, CAD, pulmonary embolism, PAF on Coumadin, systolic congestive heart failure with EF 45%, chronic kidney disease stage II, right upper lobe lung cancer (S/P surgery, radiation therapy and chemotherapy, followed up with oncologist for 5 years, currently not seeing oncologist), who presented with productive cough, fever and shortness of breath. In the ED, patient was found to have WBC 11.7, lactate of 2.47, temperature 102.4, tachycardia, tachypnea, positive urinalysis, potassium 2.9, worsening renal function. Oxygen desaturation to 89% on room air. Chest x-ray showed chronic changes similar to that noted on the prior exam, no acute infiltrate.  Assessment/Plan:   Principal Problem: Acute on chronic respiratory failure with hypoxia (HCC)/community-acquired pneumonia/small right pleural effusion Chest x-ray on admission negative for infiltrate, but Subsequent x-ray showed bilateral worsening infiltrates. There may also be contributions from COPD/CHF. Both influenza panel and respiratory virus panels were negative. Blood cultures were negative.  Sputum cultures grew normal oral pharyngeal flora. Given persistent/rising leukocytosis, and hypoxia with activity/lack of clinical improvement, a CT chest was done 06/20/15 to r/o empyema/abscess. Moderate pleural effusion on the right noted, and findings most consistent with pneumonia although lymphangitic spread of lung cancer or atelectasis in the differential. 700 mL thoracentesis done 06/21/15. Pleural fluid studies consistent with an exudate. Antibiotics broadened to Cefepime and Vancomycin 06/19/25.  Follow-up pleural fluid culture and cytology. WBC remains markedly elevated. Patient continues to be very weak. Will ask ID to  evaluate.   Active Problems: COPD (chronic obstructive pulmonary disease) with emphysema (HCC)/COPD exacerbation Completed a course of steroids. Continue bronchodilators as needed.  Atrial fibrillation with RVR (HCC)/chronic atrial fibrillation CHA2DS2-VASc Score is 5. Continue metoprolol for rate control. Coumadin resumed. INR therapeutic.  Coronary atherosclerosis of native coronary artery Stable. Continue Lipitor.  Essential hypertension, benign Blood pressure controlled.  Lung cancer, upper lobe ([place]) Status post VATS, thoracotomy in 2009. CT of the chest showed mixed interstitial airspace opacity occasion over the right lower lobe which may be due to atelectasis, infection, or lymphangitic progression of disease. Patchy opacity occasion over the left lower lobe and minimally in the central left midlung. Await cytology.  Hypokalemia Potassium repleted. Remains WNL.  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 Inadequate glycemic control noted with steroids. CBGs 93-170. Continue moderate scale SSI, 5 units of meal coverage and 15 units of Lantus. Diabetes coordinator following.  Chronic systolic CHF (congestive heart failure) ([place]) /cardiomyopathy 2-D echo done 07/18/13 showed EF 45%. Repeat echo done 06/16/15 with EF 50-55 percent. Patient remains at baseline weight. Creatinine remains stable. Would continue to hold further diuretics unless there is evidence of decompensation.  Acute on chronic kidney failure-II/acute kidney injury Creatinine slightly up today. Diuretics remain on hold. We'll restart IV fluids and 100 mL/h.  Hypothyroidism Continue Synthroid.  HLD (hyperlipidemia) Continue Lipitor.  CAD in native artery Troponins cycled. 0.03, 0.04, 0.03.  Hypomagnesemia Resolved with supplementation.  DVT Prophylaxis Chronically anticoagulated with Coumadin.   Family Communication/Anticipated D/C date  and plan/Code Status   Family Communication: Wife updated at the bedside. Disposition Plan/date: From home with wife. SNF now recommended by OT. Code Status: DO NOT RESUSCITATE   Procedures and diagnostic studies:   Dg Chest 1 View  06/21/2015  CLINICAL DATA:  Status  post thoracentesis EXAM: CHEST 1 VIEW COMPARISON:  Chest radiograph June 15, 2015; chest CT June 20, 2015 FINDINGS: There is only small residual pleural effusion on the right following thoracentesis. No pneumothorax. There is fibrotic type change in the lung bases. There is been interval clearing of interstitial edema since recent studies. There is no new opacity. Heart is mildly enlarged with pulmonary vascularity unremarkable. There is bullous disease in the upper lobe regions near the apices. IMPRESSION: No pneumothorax. Minimal residual pleural effusion. Fibrotic change in the bases. Stable cardiomegaly. Bullous disease in the apices. No frank edema or consolidation. Electronically Signed   By: Lowella Grip III M.D.   On: 06/21/2015 15:25   Dg Chest 2 View  06/13/2015  CLINICAL DATA:  Cough and fever for 3 days, initial encounter EXAM: CHEST  2 VIEW COMPARISON:  08/21/2014 FINDINGS: Cardiac shadow is stable. The lungs are well aerated bilaterally and demonstrate some chronic changes similar to that seen on the prior exam. No focal infiltrate or sizable effusion is noted. No acute bony abnormality is seen. IMPRESSION: Chronic changes similar to that noted on the prior exam. No acute infiltrate is seen. Electronically Signed   By: Inez Catalina M.D.   On: 06/13/2015 19:32   Ct Chest W Contrast  06/20/2015  CLINICAL DATA:  Leukocytosis. History of right lung cancer post lumpectomy. EXAM: CT CHEST WITH CONTRAST TECHNIQUE: Multidetector CT imaging of the chest was performed during intravenous contrast administration. CONTRAST:  1 ISOVUE-300 IOPAMIDOL (ISOVUE-300) INJECTION 61% COMPARISON:  06/24/2013 and 05/26/2013 FINDINGS: Evidence  of previous right upper lobectomy. Examination demonstrates moderate emphysematous change with bullous emphysematous disease over the left apex and right middle lobe unchanged. Stable small to moderate right pleural effusion. There is associated mixed interstitial airspace opacification over the right lower lobe which may be due to atelectasis or infection and less likely lymphangitic progression of disease. Patchy opacification over the left lower lobe and minimally in the central left midlung. Airways are within normal. Mild stable cardiomegaly. Calcified plaque over the coronary arteries. There is calcified plaque over the thoracic aorta. Few small mediastinal lymph nodes slightly improved. No significant hilar adenopathy. No evidence of axillary adenopathy. Remaining mediastinal structures are within normal. Images through the upper abdomen demonstrate evidence of previous cholecystectomy. Remainder the exam is unchanged. IMPRESSION: Postsurgical change compatible with previous right upper lobectomy for lung cancer. Stable small to moderate right pleural effusion. There is mixed interstitial airspace density over the right lower lobe and to lesser extent the posterior left lobe lower lobe and left midlung which may be due to infection and less likely atelectasis or lymphangitic spread of patient's underlying neoplasm. No significant adenopathy. Moderate emphysematous disease with bullous disease over the right middle lobe and left apex unchanged. Mild cardiomegaly with 3 vessel atherosclerotic coronary artery disease. Electronically Signed   By: Marin Olp M.D.   On: 06/20/2015 15:51   Dg Chest Port 1 View  06/15/2015  CLINICAL DATA:  Community-acquired pneumonia, history of COPD, lung malignancy EXAM: PORTABLE CHEST 1 VIEW COMPARISON:  PA and lateral chest x-ray of June 13, 2015 FINDINGS: The lungs are adequately inflated. The interstitial markings have become more conspicuous bilaterally. The cardiac  silhouette is top-normal in size. The central pulmonary vascularity is prominent. The mediastinum is normal in width. There is a pleural effusion on the right. The left lateral costophrenic angle is excluded from the study. The bony thorax is unremarkable where visualized. IMPRESSION: The study is limited by patient positioning.  Worsening of bilateral interstitial infiltrates has occurred. A small right pleural effusion is present and more conspicuous than on the earlier study. Electronically Signed   By: David  Martinique M.D.   On: 06/15/2015 07:23   US Thoracentesis Asp Pleural Space W/img Guide  06/21/2015  INDICATION: Pneumonia. Right-sided pleural effusion. Request diagnostic and therapeutic thoracentesis. EXAM: ULTRASOUND GUIDED RIGHT THORACENTESIS MEDICATIONS: None. COMPLICATIONS: None immediate. PROCEDURE: An ultrasound guided thoracentesis was thoroughly discussed with the patient and questions answered. The benefits, risks, alternatives and complications were also discussed. The patient understands and wishes to proceed with the procedure. Written consent was obtained. Ultrasound was performed to localize and mark an adequate pocket of fluid in the right chest. The area was then prepped and draped in the normal sterile fashion. 1% Lidocaine was used for local anesthesia. Under ultrasound guidance a 7 cm, 19 gauge, Yueh catheter was introduced. Thoracentesis was performed. The catheter was removed and a dressing applied. FINDINGS: A total of approximately 700 mL of clear, amber colored fluid was removed. Samples were sent to the laboratory as requested by the clinical team. IMPRESSION: Successful ultrasound guided right thoracentesis yielding 700 mL of pleural fluid. Read by: Ascencion Dike PA-C Electronically Signed   By: Lucrezia Europe M.D.   On: 06/21/2015 15:12    Medical Consultants:    Interventional Radiology  Anti-Infectives:   Azithro 3/26 > 3/29 Rocephin 3/26 > 4/2 Cefepime/Vanc 4/2  >  Subjective:   BENZION MESTA continues to be weak with a moist cough. No complaints of dyspnea. He ambulated to the nurses station with PT today. Continues to sleep much of the day.  Appetite is fine.  No reports of diarrhea other than one episode a few days ago. Says he didn't sleep all through the night.  Objective:    Filed Vitals:   06/22/15 1316 06/22/15 2158 06/23/15 0430 06/23/15 0600  BP: 98/60 107/69 108/71   Pulse: 68 62 95   Temp: 97.5 F (36.4 C) 97.4 F (36.3 C) 97.4 F (36.3 C)   TempSrc: Oral Oral Oral   Resp: '18 18 18   '$ Height:      Weight:    85.8 kg (189 lb 2.5 oz)  SpO2: 96% 97% 97%     Intake/Output Summary (Last 24 hours) at 06/23/15 0753 Last data filed at 06/23/15 0729  Gross per 24 hour  Intake    720 ml  Output   2801 ml  Net  -2081 ml   Filed Weights   06/21/15 0142 06/22/15 0254 06/23/15 0600  Weight: 86.2 kg (190 lb 0.6 oz) 87 kg (191 lb 12.8 oz) 85.8 kg (189 lb 2.5 oz)    Exam: Gen:  Weak elderly male in no acute distress Cardiovascular:  RRR, No M/R/G Respiratory:  Lungs with bilateral rhonchi Gastrointestinal:  Abdomen soft, NT/ND, + BS Extremities:  No C/E/C   Data Reviewed:    Labs: Basic Metabolic Panel:  Recent Labs Lab 06/17/15 0439 06/18/15 0742 06/19/15 0235 06/20/15 0227 06/22/15 0244 06/23/15 0329  NA 138 134* 134* 135 136 137  K 3.7 4.4 4.5 4.5 4.0 4.5  CL 102 96* 96* 98* 98* 95*  CO2 '26 29 27 29 26 30  '$ GLUCOSE 309* 302* 340* 247* 120* 59*  BUN 28* 36* 45* 42* 33* 30*  CREATININE 1.24 1.30* 1.21 1.20 1.23 1.32*  CALCIUM 8.8* 8.4* 8.5* 8.3* 8.1* 8.4*  MG 1.6* 1.5* 1.9  --   --   --   PHOS  --   --  3.4  --   --   --    GFR Estimated Creatinine Clearance: 48.2 mL/min (by C-G formula based on Cr of 1.32). Liver Function Tests:  Recent Labs Lab 06/17/15 0439 06/18/15 0742 06/19/15 0235  AST 32 35  --   ALT 28 38  --   ALKPHOS 136* 131*  --   BILITOT 1.0 1.4*  --   PROT 5.7* 5.4*  --   ALBUMIN  2.4* 2.4* 2.4*   Coagulation profile  Recent Labs Lab 06/19/15 0235 06/20/15 0227 06/21/15 0438 06/22/15 0244 06/23/15 0329  INR 1.59* 1.91* 2.37* 2.48* 2.33*    CBC:  Recent Labs Lab 06/17/15 0439 06/18/15 0742 06/20/15 0227 06/22/15 0244 06/23/15 0329  WBC 19.3* 21.0* 24.2* 19.9* 22.5*  NEUTROABS  --  18.8* 21.2*  --   --   HGB 15.0 15.8 16.0 16.1 16.5  HCT 44.3 45.4 47.5 48.4 48.9  MCV 89.0 88.0 90.0 88.8 90.7  PLT 378 424* 424* 334 337   Cardiac Enzymes: No results for input(s): CKTOTAL, CKMB, CKMBINDEX, TROPONINI in the last 168 hours. CBG:  Recent Labs Lab 06/22/15 0609 06/22/15 1128 06/22/15 1629 06/22/15 2155 06/23/15 0648  GLUCAP 124* 140* 170* 132* 93   Sepsis Labs:  Recent Labs Lab 06/18/15 0742 06/20/15 0227 06/22/15 0244 06/23/15 0329  WBC 21.0* 24.2* 19.9* 22.5*   Microbiology Recent Results (from the past 240 hour(s))  Urine culture     Status: None   Collection Time: 06/13/15  7:21 PM  Result Value Ref Range Status   Specimen Description URINE, RANDOM  Final   Special Requests NONE  Final   Culture NO GROWTH 1 DAY  Final   Report Status 06/14/2015 FINAL  Final  Culture, blood (routine x 2)     Status: None   Collection Time: 06/13/15  7:30 PM  Result Value Ref Range Status   Specimen Description BLOOD FOREARM  Final   Special Requests BOTTLES DRAWN AEROBIC AND ANAEROBIC 5CC  Final   Culture NO GROWTH 5 DAYS  Final   Report Status 06/18/2015 FINAL  Final  Culture, blood (routine x 2)     Status: None   Collection Time: 06/13/15  7:43 PM  Result Value Ref Range Status   Specimen Description BLOOD RIGHT ANTECUBITAL  Final   Special Requests BOTTLES DRAWN AEROBIC AND ANAEROBIC 5CC  Final   Culture NO GROWTH 5 DAYS  Final   Report Status 06/18/2015 FINAL  Final  Respiratory virus panel     Status: None   Collection Time: 06/14/15 12:42 AM  Result Value Ref Range Status   Source - RVPAN NASAL SWAB  Corrected   Respiratory  Syncytial Virus A Negative Negative Final   Respiratory Syncytial Virus B Negative Negative Final   Influenza A Negative Negative Final   Influenza B Negative Negative Final   Parainfluenza 1 Negative Negative Final   Parainfluenza 2 Negative Negative Final   Parainfluenza 3 Negative Negative Final   Metapneumovirus Negative Negative Final   Rhinovirus Negative Negative Final   Adenovirus Negative Negative Final    Comment: (NOTE) Performed At: [facility] [place], [place] [id number] Lindon Romp MD PP:[id number]   MRSA PCR Screening     Status: None   Collection Time: 06/14/15 12:52 AM  Result Value Ref Range Status   MRSA by PCR NEGATIVE NEGATIVE Final    Comment:        The GeneXpert MRSA Assay (FDA approved for NASAL  specimens only), is one component of a comprehensive MRSA colonization surveillance program. It is not intended to diagnose MRSA infection nor to guide or monitor treatment for MRSA infections.   Culture, sputum-assessment     Status: None   Collection Time: 06/14/15  4:45 AM  Result Value Ref Range Status   Specimen Description SPUTUM  Final   Special Requests NONE  Final   Sputum evaluation THIS SPECIMEN IS ACCEPTABLE FOR SPUTUM CULTURE  Final   Report Status 06/14/2015 FINAL  Final  Culture, respiratory (NON-Expectorated)     Status: None   Collection Time: 06/14/15  4:45 AM  Result Value Ref Range Status   Specimen Description SPUTUM  Final   Special Requests NONE  Final   Gram Stain   Final    ABUNDANT WBC PRESENT,BOTH PMN AND MONONUCLEAR MODERATE SQUAMOUS EPITHELIAL CELLS PRESENT ABUNDANT GRAM POSITIVE COCCI IN PAIRS IN CLUSTERS MODERATE GRAM NEGATIVE RODS THIS SPECIMEN IS ACCEPTABLE FOR SPUTUM CULTURE Performed at [facility]    Culture   Final    NORMAL OROPHARYNGEAL FLORA Performed at [facility]    Report Status 06/16/2015 FINAL  Final  Culture, body fluid-bottle     Status: None  (Preliminary result)   Collection Time: 06/21/15  3:26 PM  Result Value Ref Range Status   Specimen Description FLUID RIGHT PLEURAL  Final   Special Requests BOTTLES DRAWN AEROBIC AND ANAEROBIC 10CC  Final   Culture NO GROWTH < 24 HOURS  Final   Report Status PENDING  Incomplete  Gram stain     Status: None   Collection Time: 06/21/15  3:26 PM  Result Value Ref Range Status   Specimen Description FLUID RIGHT PLEURAL  Final   Special Requests NONE  Final   Gram Stain   Final    FEW WBC PRESENT, PREDOMINANTLY MONONUCLEAR NO ORGANISMS SEEN    Report Status 06/21/2015 FINAL  Final     Medications:   . antiseptic oral rinse  7 mL Mouth Rinse BID  . atorvastatin  20 mg Oral q morning - 10a  . ceFEPime (MAXIPIME) IV  1 g Intravenous Q8H  . dextromethorphan-guaiFENesin  1 tablet Oral BID  . diltiazem  240 mg Oral Daily  . insulin aspart  0-15 Units Subcutaneous TID WC  . insulin aspart  0-5 Units Subcutaneous QHS  . insulin aspart  5 Units Subcutaneous TID WC  . insulin glargine  15 Units Subcutaneous QHS  . levothyroxine  112 mcg Oral QAC breakfast  . metoprolol tartrate  12.5 mg Oral BID  . omega-3 acid ethyl esters  1 g Oral Daily  . pantoprazole  40 mg Oral Daily  . vancomycin  750 mg Intravenous Q12H  . Warfarin - Pharmacist Dosing Inpatient   Does not apply q1800   Continuous Infusions:   Time spent: 25 minutes   LOS: 10 days   [facility] Pager [phone]. If unable to reach me by pager, please call my cell phone at [phone].  *Please refer to [email or web address], password [email or web address] to get updated schedule on who will round on this patient, as hospitalists switch teams weekly. If 7PM-7AM, please contact night-coverage at [email or web address], password [email or web address] for any overnight needs.  06/23/2015, 7:53 AM

## 2015-06-23 NOTE — Progress Notes (Signed)
Physical Therapy Treatment Patient Details Name: Justin Garner MRN: [id number] DOB: 11-28-1932 Today's Date: 06/23/2015    History of Present Illness Justin Garner is a 82 y.o. male with PMH of hypertension, hyperlipidemia, COPD, GERD, hypothyroidism, CAD, pulmonary embolism, PAF on Coumadin, systolic congestive heart failure with EF 45%, chronic kidney disease-stage II, history of right upper lobe lung cancer (S/P surgery, radiation therapy and chemotherapy, followed up with oncologist for 5 years, currently not seeing oncologist), who presents with productive cough, fever and shortness of breath.    PT Comments    Patient feeling better today. Able to progress ambulation distance today but needs seated rest break due to fatigue. Pt continues to exhibit decreased endurance and weakness in LEs but able to maintain Sp02 90% on 1-2L/min 02. Motivated to return home if he can with support of wife. Needs to be able to negotiate 3 steps to enter home. Will continue to follow. Still would benefit from [place] [facility].   Follow Up Recommendations  SNF     Equipment Recommendations  Rolling walker with 5" wheels    Recommendations for Other Services       Precautions / Restrictions Precautions Precautions: Fall Precaution Comments: watch SpO2 and HR Restrictions Weight Bearing Restrictions: No    Mobility  Bed Mobility               General bed mobility comments: Sitting in chair upon PT arrival.   Transfers Overall transfer level: Needs assistance Equipment used: Rolling walker (2 wheeled) Transfers: Sit to/from Stand Sit to Stand: Min guard         General transfer comment: Min guard for safety. Slow to stand from low chair x2.   Ambulation/Gait Ambulation/Gait assistance: Min guard Ambulation Distance (Feet): 70 Feet (+ 90') Assistive device: Rolling walker (2 wheeled) Gait Pattern/deviations: Step-through pattern;Decreased stride length;Trunk flexed Gait velocity:  very slow   General Gait Details: Very slow, steady gait with cues for upright posture and to increase step length; Ambulated on 1-2L/min 02 and Sp02 read 90%. Difficult to get accurate reading due to cold fingers. 1 seated rest break. HR up to 130s bpm.   Stairs            Wheelchair Mobility    Modified Rankin (Stroke Patients Only)       Balance Overall balance assessment: Needs assistance Sitting-balance support: Feet supported;No upper extremity supported Sitting balance-Leahy Scale: Fair     Standing balance support: During functional activity;Bilateral upper extremity supported Standing balance-Leahy Scale: Poor                      Cognition Arousal/Alertness: Awake/alert Behavior During Therapy: Flat affect Overall Cognitive Status: Within Functional Limits for tasks assessed                      Exercises      General Comments General comments (skin integrity, edema, etc.): Wife present during session.      Pertinent Vitals/Pain Pain Assessment: No/denies pain    Home Living                      Prior Function            PT Goals (current goals can now be found in the care plan section) Progress towards PT goals: Progressing toward goals    Frequency  Min 3X/week    PT Plan Current plan remains appropriate    Co-evaluation  End of Session Equipment Utilized During Treatment: Gait belt;Oxygen Activity Tolerance: Patient tolerated treatment well Patient left: in chair;with call bell/phone within reach;with family/visitor present     Time: 0926-1001 PT Time Calculation (min) (ACUTE ONLY): 35 min  Charges:  $Gait Training: 23-37 mins                    G Codes:      Eliane Hammersmith A Alan Riles 06/23/2015, 12:01 PM Wray Kearns, Cook, DPT [phone]

## 2015-06-24 LAB — HIV ANTIBODY (ROUTINE TESTING W REFLEX): HIV Screen 4th Generation wRfx: NONREACTIVE

## 2015-06-24 LAB — GLUCOSE, CAPILLARY
GLUCOSE-CAPILLARY: 107 mg/dL — AB (ref 65–99)
GLUCOSE-CAPILLARY: 81 mg/dL (ref 65–99)
Glucose-Capillary: 103 mg/dL — ABNORMAL HIGH (ref 65–99)
Glucose-Capillary: 176 mg/dL — ABNORMAL HIGH (ref 65–99)

## 2015-06-24 LAB — C-REACTIVE PROTEIN: CRP: 0.9 mg/dL (ref <1.0)

## 2015-06-24 LAB — PROTIME-INR
INR: 2.47 — ABNORMAL HIGH (ref 0.00–1.49)
Prothrombin Time: 26.5 seconds — ABNORMAL HIGH (ref 11.6–15.2)

## 2015-06-24 LAB — SEDIMENTATION RATE: Sed Rate: 2 mm/hr (ref 0–16)

## 2015-06-24 LAB — LACTATE DEHYDROGENASE: LDH: 203 U/L — ABNORMAL HIGH (ref 98–192)

## 2015-06-24 MED ORDER — WARFARIN SODIUM 5 MG PO TABS
5.0000 mg | ORAL_TABLET | Freq: Once | ORAL | Status: AC
  Administered 2015-06-24: 5 mg via ORAL
  Filled 2015-06-24: qty 1

## 2015-06-24 MED ORDER — INSULIN GLARGINE 100 UNIT/ML ~~LOC~~ SOLN
10.0000 [IU] | Freq: Every day | SUBCUTANEOUS | Status: DC
  Administered 2015-06-24: 10 [IU] via SUBCUTANEOUS
  Filled 2015-06-24 (×2): qty 0.1

## 2015-06-24 NOTE — Progress Notes (Signed)
Occupational Therapy Treatment Patient Details Name: Justin Garner MRN: [id number] DOB: [birth year] Today's Date: 06/24/2015    History of present illness Justin Garner is a 82 y.o. male with PMH of hypertension, hyperlipidemia, COPD, GERD, hypothyroidism, CAD, pulmonary embolism, PAF on Coumadin, systolic congestive heart failure with EF 45%, chronic kidney disease-stage II, history of right upper lobe lung cancer (S/P surgery, radiation therapy and chemotherapy, followed up with oncologist for 5 years, currently not seeing oncologist), who presents with productive cough, fever and shortness of breath.   OT comments  This 82 yo male admitted with above making progress towards grooming goal--can now perform these tasks standing at sink (2 tasks today). He remains at a min guard A for transfers. He will continue to benefit from acute OT with follow up OT at SNF to get to a level he can go home with wife.  Follow Up Recommendations  SNF    Equipment Recommendations  3 in 1 bedside comode       Precautions / Restrictions Precautions Precautions: Fall Restrictions Weight Bearing Restrictions: No       Mobility Bed Mobility Overal bed mobility: Needs Assistance Bed Mobility: Sit to Supine       Sit to supine: Supervision    Transfers Overall transfer level: Needs assistance Equipment used: Rolling walker (2 wheeled) Transfers: Sit to/from Stand Sit to Stand: Min guard         General transfer comment: ambulated 100 feet with RW with min guard A    Balance Overall balance assessment: Needs assistance Sitting-balance support: Feet supported;No upper extremity supported Sitting balance-Leahy Scale: Good     Standing balance support: Single extremity supported;During functional activity Standing balance-Leahy Scale: Poor Standing balance comment: reliant on at least one UE on surface for balance (RC:VELFY standing at sink pt had one hand on counter while using other  hand to do mouth care and wash face)                   ADL Overall ADL's : Needs assistance/impaired     Grooming: Wash/dry face;Oral care;Min guard;Standing                   Toilet Transfer: Min guard;Ambulation;RW (recliner>sink to groom>out door and back 100 feet>bed)                              Cognition   Behavior During Therapy: Flat affect (but joking) Overall Cognitive Status: Within Functional Limits for tasks assessed                                    Pertinent Vitals/ Pain       Pain Assessment: No/denies pain Pain Score: 5  Pain Location: lower back Pain Descriptors / Indicators: Sore Pain Intervention(s): Monitored during session;Repositioned         Frequency Min 2X/week     Progress Toward Goals  OT Goals(current goals can now be found in the care plan section)  Progress towards OT goals: Progressing toward goals     Plan Discharge plan remains appropriate       End of Session Equipment Utilized During Treatment: Gait belt;Rolling walker   Activity Tolerance Patient tolerated treatment well   Patient Left in bed;with call bell/phone within reach;with bed alarm set;with family/visitor present   Nurse Communication  (checked with  RN before getting pt coffee and graham crackers)        Time: 4462-8638 OT Time Calculation (min): 31 min  Charges: OT General Charges $OT Visit: 1 Procedure OT Treatments $Self Care/Home Management : 23-37 mins  Almon Register [phone] 06/24/2015, 3:22 PM

## 2015-06-24 NOTE — Progress Notes (Signed)
Physical Therapy Treatment Patient Details Name: Justin Garner MRN: [id number] DOB: 07/14/1932 Today's Date: 06/24/2015    History of Present Illness Justin Garner is a 82 y.o. male with PMH of hypertension, hyperlipidemia, COPD, GERD, hypothyroidism, CAD, pulmonary embolism, PAF on Coumadin, systolic congestive heart failure with EF 45%, chronic kidney disease-stage II, history of right upper lobe lung cancer (S/P surgery, radiation therapy and chemotherapy, followed up with oncologist for 5 years, currently not seeing oncologist), who presents with productive cough, fever and shortness of breath.    PT Comments    Patient progressing with mobility and endurance.  Able to negotiate steps this session due to earlier wanting to go to home rather than rehab.  Still feel patient would benefit from short term rehab due to lengthy illness and rather deconditioned.  Will continue to follow until d/c.  Follow Up Recommendations  SNF     Equipment Recommendations  Rolling walker with 5" wheels    Recommendations for Other Services       Precautions / Restrictions Precautions Precautions: Fall Restrictions Weight Bearing Restrictions: No    Mobility  Bed Mobility               General bed mobility comments: Sitting in chair upon PT arrival.   Transfers Overall transfer level: Needs assistance Equipment used: Rolling walker (2 wheeled) Transfers: Sit to/from Stand Sit to Stand: Min guard         General transfer comment: slow to stand from chair, but very little assist needed, performed x 2  Ambulation/Gait Ambulation/Gait assistance: Min guard;Supervision Ambulation Distance (Feet): 100 Feet (and 120') Assistive device: Rolling walker (2 wheeled) Gait Pattern/deviations: Step-through pattern;Decreased stride length;Trunk flexed     General Gait Details: sat to rest prior to negotiating stairs, noted back pain he feels is from where he lays/sits in bed when Justin Garner  is elevated; cues for posture, forward gaze and proximity to walker; HR 90's with ambulation, dyspnea level 2/4   Stairs Stairs: Yes Stairs assistance: Min assist Stair Management: Step to pattern;Alternating pattern;Sideways;Forwards;Two rails;One rail Right Number of Stairs: 3 General stair comments: ascended with two rails and alternating pattern; descending with One rail and sideways step to pattern with cue for technique  Wheelchair Mobility    Modified Rankin (Stroke Patients Only)       Balance Overall balance assessment: Needs assistance   Sitting balance-Leahy Scale: Good     Standing balance support: Bilateral upper extremity supported Standing balance-Leahy Scale: Poor Standing balance comment: UE support needed for balance                    Cognition Arousal/Alertness: Awake/alert Behavior During Therapy: Flat affect Overall Cognitive Status: Within Functional Limits for tasks assessed                      Exercises      General Comments General comments (skin integrity, edema, etc.): wife assisted pushing chair behind pt       Pertinent Vitals/Pain Pain Assessment: 0-10 Pain Score: 5  Pain Location: lower back Pain Descriptors / Indicators: Sore Pain Intervention(s): Monitored during session;Repositioned    Home Living                      Prior Function            PT Goals (current goals can now be found in Justin care plan section) Progress towards PT goals: Progressing toward  goals    Frequency  Min 3X/week    PT Plan Current plan remains appropriate    Co-evaluation             End of Session Equipment Utilized During Treatment: Gait belt Activity Tolerance: Patient tolerated treatment well Patient left: in chair;with call bell/phone within reach;with family/visitor present;Other (comment) (MD in room)     Time: 1660-6004 PT Time Calculation (min) (ACUTE ONLY): 32 min  Charges:  $Gait Training:  23-37 mins                    G Codes:      Justin Garner 18-Jul-2015, 11:57 AM Justin Garner, Justin Garner 07-18-2015

## 2015-06-24 NOTE — Progress Notes (Signed)
Subjective: Was tired last night   Antibiotics:  Anti-infectives    Start     Dose/Rate Route Frequency Ordered Stop   06/21/15 0600  vancomycin (VANCOCIN) IVPB 750 mg/150 ml premix  Status:  Discontinued     750 mg 150 mL/hr over 60 Minutes Intravenous Every 12 hours 06/20/15 1634 06/23/15 1151   06/20/15 1800  vancomycin (VANCOCIN) 1,500 mg in sodium chloride 0.9 % 500 mL IVPB     1,500 mg 250 mL/hr over 120 Minutes Intravenous  Once 06/20/15 1634 06/20/15 1905   06/20/15 1730  ceFEPIme (MAXIPIME) 1 g in dextrose 5 % 50 mL IVPB  Status:  Discontinued     1 g 100 mL/hr over 30 Minutes Intravenous Every 8 hours 06/20/15 1634 06/23/15 1201   06/14/15 2030  azithromycin (ZITHROMAX) 250 mg in dextrose 5 % 125 mL IVPB  Status:  Discontinued     250 mg 125 mL/hr over 60 Minutes Intravenous Every 24 hours 06/13/15 1922 06/16/15 1025   06/14/15 2000  cefTRIAXone (ROCEPHIN) 1 g in dextrose 5 % 50 mL IVPB  Status:  Discontinued     1 g 100 mL/hr over 30 Minutes Intravenous Every 24 hours 06/13/15 1922 06/20/15 1630   06/13/15 2230  cefTRIAXone (ROCEPHIN) 1 g in dextrose 5 % 50 mL IVPB  Status:  Discontinued     1 g 100 mL/hr over 30 Minutes Intravenous Every 24 hours 06/13/15 2229 06/13/15 2232   06/13/15 2230  azithromycin (ZITHROMAX) 500 mg in dextrose 5 % 250 mL IVPB  Status:  Discontinued     500 mg 250 mL/hr over 60 Minutes Intravenous Every 24 hours 06/13/15 2229 06/13/15 2232   06/13/15 1900  cefTRIAXone (ROCEPHIN) 1 g in dextrose 5 % 50 mL IVPB     1 g 100 mL/hr over 30 Minutes Intravenous  Once 06/13/15 1848 06/13/15 2018   06/13/15 1900  azithromycin (ZITHROMAX) 500 mg in dextrose 5 % 250 mL IVPB     500 mg 250 mL/hr over 60 Minutes Intravenous  Once 06/13/15 1848 06/13/15 2116      Medications: Scheduled Meds: . antiseptic oral rinse  7 mL Mouth Rinse BID  . atorvastatin  20 mg Oral q morning - 10a  . dextromethorphan-guaiFENesin  1 tablet Oral BID  .  diltiazem  240 mg Oral Daily  . insulin aspart  0-15 Units Subcutaneous TID WC  . insulin aspart  0-5 Units Subcutaneous QHS  . insulin aspart  5 Units Subcutaneous TID WC  . insulin glargine  10 Units Subcutaneous QHS  . levothyroxine  112 mcg Oral QAC breakfast  . metoprolol tartrate  12.5 mg Oral BID  . omega-3 acid ethyl esters  1 g Oral Daily  . pantoprazole  40 mg Oral Daily  . traZODone  50 mg Oral QHS  . warfarin  5 mg Oral ONCE-1800  . Warfarin - Pharmacist Dosing Inpatient   Does not apply q1800   Continuous Infusions: . sodium chloride 100 mL/hr at 06/24/15 0758   PRN Meds:.acetaminophen, ipratropium, levalbuterol, nitroGLYCERIN    Objective: Weight change: -2 lb 7.3 oz (-1.113 kg)  Intake/Output Summary (Last 24 hours) at 06/24/15 1530 Last data filed at 06/24/15 1300  Gross per 24 hour  Intake    720 ml  Output   1450 ml  Net   -730 ml   Blood pressure 104/59, pulse 71, temperature 97.7 F (36.5 C), temperature source Oral, resp. rate 17,  height 6' (1.829 m), weight 186 lb 11.2 oz (84.687 kg), SpO2 98 %. Temp:  [97.4 F (36.3 C)-97.7 F (36.5 C)] 97.7 F (36.5 C) (04/06 1311) Pulse Rate:  [52-71] 71 (04/06 1311) Resp:  [17-18] 17 (04/06 1311) BP: (104-122)/(59-74) 104/59 mmHg (04/06 1311) SpO2:  [98 %-99 %] 98 % (04/06 1311) Weight:  [186 lb 11.2 oz (84.687 kg)] 186 lb 11.2 oz (84.687 kg) (04/06 0324)  Physical Exam: General: Alert and awake, oriented x3, not in any acute distress. HEENT: anicteric sclera, EOMI, oropharynx clear and without exudate Cardiovascular:irr irr normal r, no murmur rubs or gallops Pulmonary: clear to auscultation bilaterally, no wheezing, rales or rhonchi Gastrointestinal: soft nontender, nondistended, normal bowel sounds, Musculoskeletal: no clubbing or edema noted bilaterally Skin, soft tissue: few ecchymoses Neuro: nonfocal, strength and sensation intact  CBC:  CBC Latest Ref Rng 06/23/2015 06/22/2015 06/20/2015  WBC 4.0 -  10.5 K/uL 22.5(H) 19.9(H) 24.2(H)  Hemoglobin 13.0 - 17.0 g/dL 16.5 16.1 16.0  Hematocrit 39.0 - 52.0 % 48.9 48.4 47.5  Platelets 150 - 400 K/uL 337 334 424(H)      BMET  Recent Labs  06/22/15 0244 06/23/15 0329  NA 136 137  K 4.0 4.5  CL 98* 95*  CO2 26 30  GLUCOSE 120* 59*  BUN 33* 30*  CREATININE 1.23 1.32*  CALCIUM 8.1* 8.4*     Liver Panel  No results for input(s): PROT, ALBUMIN, AST, ALT, ALKPHOS, BILITOT, BILIDIR, IBILI in the last 72 hours.     Sedimentation Rate  Recent Labs  06/24/15 0212  ESRSEDRATE 2   C-Reactive Protein  Recent Labs  06/24/15 0212  CRP 0.9    Micro Results: Recent Results (from the past 720 hour(s))  Urine culture     Status: None   Collection Time: 06/13/15  7:21 PM  Result Value Ref Range Status   Specimen Description URINE, RANDOM  Final   Special Requests NONE  Final   Culture NO GROWTH 1 DAY  Final   Report Status 06/14/2015 FINAL  Final  Culture, blood (routine x 2)     Status: None   Collection Time: 06/13/15  7:30 PM  Result Value Ref Range Status   Specimen Description BLOOD FOREARM  Final   Special Requests BOTTLES DRAWN AEROBIC AND ANAEROBIC 5CC  Final   Culture NO GROWTH 5 DAYS  Final   Report Status 06/18/2015 FINAL  Final  Culture, blood (routine x 2)     Status: None   Collection Time: 06/13/15  7:43 PM  Result Value Ref Range Status   Specimen Description BLOOD RIGHT ANTECUBITAL  Final   Special Requests BOTTLES DRAWN AEROBIC AND ANAEROBIC 5CC  Final   Culture NO GROWTH 5 DAYS  Final   Report Status 06/18/2015 FINAL  Final  Respiratory virus panel     Status: None   Collection Time: 06/14/15 12:42 AM  Result Value Ref Range Status   Source - RVPAN NASAL SWAB  Corrected   Respiratory Syncytial Virus A Negative Negative Final   Respiratory Syncytial Virus B Negative Negative Final   Influenza A Negative Negative Final   Influenza B Negative Negative Final   Parainfluenza 1 Negative Negative Final    Parainfluenza 2 Negative Negative Final   Parainfluenza 3 Negative Negative Final   Metapneumovirus Negative Negative Final   Rhinovirus Negative Negative Final   Adenovirus Negative Negative Final    Comment: (NOTE) Performed At: [facility] [address] [place], [place] [id number] Evette Doffing  Darrick Penna MD [id number]   MRSA PCR Screening     Status: None   Collection Time: 06/14/15 12:52 AM  Result Value Ref Range Status   MRSA by PCR NEGATIVE NEGATIVE Final    Comment:        The GeneXpert MRSA Assay (FDA approved for NASAL specimens only), is one component of a comprehensive MRSA colonization surveillance program. It is not intended to diagnose MRSA infection nor to guide or monitor treatment for MRSA infections.   Culture, sputum-assessment     Status: None   Collection Time: 06/14/15  4:45 AM  Result Value Ref Range Status   Specimen Description SPUTUM  Final   Special Requests NONE  Final   Sputum evaluation THIS SPECIMEN IS ACCEPTABLE FOR SPUTUM CULTURE  Final   Report Status 06/14/2015 FINAL  Final  Culture, respiratory (NON-Expectorated)     Status: None   Collection Time: 06/14/15  4:45 AM  Result Value Ref Range Status   Specimen Description SPUTUM  Final   Special Requests NONE  Final   Gram Stain   Final    ABUNDANT WBC PRESENT,BOTH PMN AND MONONUCLEAR MODERATE SQUAMOUS EPITHELIAL CELLS PRESENT ABUNDANT GRAM POSITIVE COCCI IN PAIRS IN CLUSTERS MODERATE GRAM NEGATIVE RODS THIS SPECIMEN IS ACCEPTABLE FOR SPUTUM CULTURE Performed at [facility]    Culture   Final    NORMAL OROPHARYNGEAL FLORA Performed at [facility]    Report Status 06/16/2015 FINAL  Final  Culture, body fluid-bottle     Status: None (Preliminary result)   Collection Time: 06/21/15  3:26 PM  Result Value Ref Range Status   Specimen Description FLUID RIGHT PLEURAL  Final   Special Requests BOTTLES DRAWN AEROBIC AND ANAEROBIC 10CC  Final   Culture NO GROWTH  3 DAYS  Final   Report Status PENDING  Incomplete  Gram stain     Status: None   Collection Time: 06/21/15  3:26 PM  Result Value Ref Range Status   Specimen Description FLUID RIGHT PLEURAL  Final   Special Requests NONE  Final   Gram Stain   Final    FEW WBC PRESENT, PREDOMINANTLY MONONUCLEAR NO ORGANISMS SEEN    Report Status 06/21/2015 FINAL  Final    Studies/Results: No results found.    Assessment/Plan:  INTERVAL HISTORY:   No further fevers off abx   Principal Problem:   Acute on chronic respiratory failure with hypoxia (HCC) Active Problems:   COPD (chronic obstructive pulmonary disease) with emphysema (HCC)   Atrial fibrillation with RVR (HCC)   Coronary atherosclerosis of native coronary artery   Essential hypertension, benign   Lung cancer, upper lobe (HCC)   Hypokalemia   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 (HCC)   CAP (community acquired pneumonia)   Chronic systolic CHF (congestive heart failure) (HCC)   Acute on chronic kidney failure-II   Hypothyroidism   HLD (hyperlipidemia)   COPD exacerbation (HCC)   Pleural effusion, right   CAD in native artery   Hypomagnesemia   Controlled diabetes mellitus type 2 with complications ([place])   Acute kidney injury ([place])   Acute respiratory failure with hypoxia (HCC)   Cardiomyopathy (HCC)   Chronic atrial fibrillation with RVR (HCC)   Supratherapeutic INR   Type 2 diabetes mellitus with complication (HCC)   Respiratory acidosis   Chronic obstructive pulmonary disease (HCC)   Chronic diastolic CHF (congestive heart failure) (HCC)   Secondary cardiomyopathy ([place])   Chronic atrial fibrillation ([place])  Pleural effusion on right   Leukocytosis    Justin Garner is a 82 y.o. male with  lung cancer COPD mx other medical problems admitted with fever cough, clear CXR but concern for CAP sp rx with 9 days of IV abx with persistent leukocytosis. Note his CT scan that was done shows an area that could be consistent with  possible lymphangitic spread of prior malignancy though radiology favored other and these in the differential. He also has a 40 pound weight loss which is significant.   #1 Persistent leukocytosis: this is not due to a CAP or HCAP based on his history exam and radiographic findings. It could be due to return of his lung cancer. It could be due to an occult infection in his abdomen though his abdominal exam is benign. He could have underlying hematology issue. i would note that in October 29th 2015 he had WBC of 29.6K with labs at followup with  Cardiology visit after hospitalization.  I have DC abx   --if fever returns or he deteriorates in some other way would be sure to get CT abdomen with IV and oral contrast to look for infection there --he likely needs a PET scan to workup the findings on CT scan --it would be helfpul to have access to Dr Polite's labs for comparison re his WBC trend over past 2 years  --I would also recommend he be seen by Hematology but this could be done as an outpatient   I will sign off for now please call with further questions.     LOS: 11 days   Alcide Evener 06/24/2015, 3:30 PM

## 2015-06-24 NOTE — Clinical Social Work Note (Signed)
Clinical Social Worker continuing to follow patient and family for support and discharge planning needs.  CSW spoke with patient and patient wife at bedside and they have chosen to accept the bed offer at the [facility].  CSW contacted facility who is agreeable with patient admission tomorrow.  CSW to submit for insurance authorization in the morning and facilitate patient discharge needs.  CSW remains available for support.  Barbette Or, [place]

## 2015-06-24 NOTE — Progress Notes (Signed)
Progress Note   DEMETRY BENDICKSON AQT:[id number] DOB: 1932/10/31 DOA: 06/13/2015 PCP: Kandice Hams, MD   Brief Narrative:   Justin Garner is an 82 y.o. male with Hx of hypertension, hyperlipidemia, COPD, GERD, hypothyroidism, CAD, pulmonary embolism, PAF on Coumadin, systolic congestive heart failure with EF 45%, chronic kidney disease stage II, right upper lobe lung cancer (S/P surgery, radiation therapy and chemotherapy, followed up with oncologist for 5 years, currently not seeing oncologist), who presented with productive cough, fever and shortness of breath. In the ED, patient was found to have WBC 11.7, lactate of 2.47, temperature 102.4, tachycardia, tachypnea, positive urinalysis, potassium 2.9, worsening renal function. Oxygen desaturation to 89% on room air. Chest x-ray showed chronic changes similar to that noted on the prior exam, no acute infiltrate.  Assessment/Plan:   Principal Problem: Acute on chronic respiratory failure with hypoxia (HCC)/community-acquired pneumonia/small right pleural effusion Chest x-ray on admission negative for infiltrate, but Subsequent x-ray showed bilateral worsening infiltrates. There may also be contributions from COPD/CHF. Both influenza panel and respiratory virus panels were negative. Blood cultures were negative.  Sputum cultures grew normal oral pharyngeal flora. Given persistent/rising leukocytosis, and hypoxia with activity/lack of clinical improvement, a CT chest was done 06/20/15 to r/o empyema/abscess. Moderate pleural effusion on the right noted, and findings most consistent with pneumonia although lymphangitic spread of lung cancer or atelectasis in the differential. 700 mL thoracentesis done 06/21/15. Pleural fluid studies consistent with an exudate. Antibiotics broadened to Cefepime and Vancomycin 06/19/25, subsequently discontinued 06/23/15 upon the advice of ID.  Follow-up pleural fluid culture. Cytology showed reactive mesothelial  cells. Will likely need outpatient pulmonology follow-up.  Active Problems: COPD (chronic obstructive pulmonary disease) with emphysema (HCC)/COPD exacerbation Completed a course of steroids. Continue bronchodilators as needed.  Atrial fibrillation with RVR (HCC)/chronic atrial fibrillation CHA2DS2-VASc Score is 5. Continue metoprolol for rate control. Coumadin resumed. INR therapeutic.  Coronary atherosclerosis of native coronary artery Stable. Continue Lipitor.  Essential hypertension, benign Blood pressure controlled.  Lung cancer, upper lobe ([facility]) Status post VATS, thoracotomy in 2009. CT of the chest showed mixed interstitial airspace opacity occasion over the right lower lobe which may be due to atelectasis, infection, or lymphangitic progression of disease. Patchy opacity occasion over the left lower lobe and minimally in the central left midlung. Cytology showed only reactive mesothelial cells.  Hypokalemia Potassium repleted. Remains WNL.  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 Inadequate glycemic control noted with steroids. CBGs 88-107. Currently being managed with moderate scale SSI, 5 units of meal coverage and 15 units of Lantus. Decrease Lantus to 10 units. Diabetes coordinator following.  Chronic systolic CHF (congestive heart failure) ([place]) /cardiomyopathy 2-D echo done 07/18/13 showed EF 45%. Repeat echo done 06/16/15 with EF 50-55 percent. Patient remains at baseline weight. Creatinine remains stable. Would continue to hold further diuretics unless there is evidence of decompensation.  Acute on chronic kidney failure-II/acute kidney injury Creatinine slightly up today. Diuretics remain on hold. We'll restart IV fluids at 100 mL/h.  Hypothyroidism Continue Synthroid.  HLD (hyperlipidemia) Continue Lipitor.  CAD in native artery Troponins cycled. 0.03, 0.04, 0.03.  Hypomagnesemia Resolved with  supplementation.  DVT Prophylaxis Chronically anticoagulated with Coumadin.   Family Communication/Anticipated D/C date and plan/Code Status   Family Communication: Wife updated at the bedside. Disposition Plan/date: From home with wife. SNF now recommended by OT. Code Status: DO NOT RESUSCITATE   Procedures and diagnostic studies:  Dg Chest 1 View  06/21/2015  CLINICAL DATA:  Status post thoracentesis EXAM: CHEST 1 VIEW COMPARISON:  Chest radiograph June 15, 2015; chest CT June 20, 2015 FINDINGS: There is only small residual pleural effusion on the right following thoracentesis. No pneumothorax. There is fibrotic type change in the lung bases. There is been interval clearing of interstitial edema since recent studies. There is no new opacity. Heart is mildly enlarged with pulmonary vascularity unremarkable. There is bullous disease in the upper lobe regions near the apices. IMPRESSION: No pneumothorax. Minimal residual pleural effusion. Fibrotic change in the bases. Stable cardiomegaly. Bullous disease in the apices. No frank edema or consolidation. Electronically Signed   By: Lowella Grip III M.D.   On: 06/21/2015 15:25   Dg Chest 2 View  06/13/2015  CLINICAL DATA:  Cough and fever for 3 days, initial encounter EXAM: CHEST  2 VIEW COMPARISON:  08/21/2014 FINDINGS: Cardiac shadow is stable. The lungs are well aerated bilaterally and demonstrate some chronic changes similar to that seen on the prior exam. No focal infiltrate or sizable effusion is noted. No acute bony abnormality is seen. IMPRESSION: Chronic changes similar to that noted on the prior exam. No acute infiltrate is seen. Electronically Signed   By: Inez Catalina M.D.   On: 06/13/2015 19:32   Ct Chest W Contrast  06/20/2015  CLINICAL DATA:  Leukocytosis. History of right lung cancer post lumpectomy. EXAM: CT CHEST WITH CONTRAST TECHNIQUE: Multidetector CT imaging of the chest was performed during intravenous contrast  administration. CONTRAST:  1 ISOVUE-300 IOPAMIDOL (ISOVUE-300) INJECTION 61% COMPARISON:  06/24/2013 and 05/26/2013 FINDINGS: Evidence of previous right upper lobectomy. Examination demonstrates moderate emphysematous change with bullous emphysematous disease over the left apex and right middle lobe unchanged. Stable small to moderate right pleural effusion. There is associated mixed interstitial airspace opacification over the right lower lobe which may be due to atelectasis or infection and less likely lymphangitic progression of disease. Patchy opacification over the left lower lobe and minimally in the central left midlung. Airways are within normal. Mild stable cardiomegaly. Calcified plaque over the coronary arteries. There is calcified plaque over the thoracic aorta. Few small mediastinal lymph nodes slightly improved. No significant hilar adenopathy. No evidence of axillary adenopathy. Remaining mediastinal structures are within normal. Images through the upper abdomen demonstrate evidence of previous cholecystectomy. Remainder the exam is unchanged. IMPRESSION: Postsurgical change compatible with previous right upper lobectomy for lung cancer. Stable small to moderate right pleural effusion. There is mixed interstitial airspace density over the right lower lobe and to lesser extent the posterior left lobe lower lobe and left midlung which may be due to infection and less likely atelectasis or lymphangitic spread of patient's underlying neoplasm. No significant adenopathy. Moderate emphysematous disease with bullous disease over the right middle lobe and left apex unchanged. Mild cardiomegaly with 3 vessel atherosclerotic coronary artery disease. Electronically Signed   By: Marin Olp M.D.   On: 06/20/2015 15:51   Dg Chest Port 1 View  06/15/2015  CLINICAL DATA:  Community-acquired pneumonia, history of COPD, lung malignancy EXAM: PORTABLE CHEST 1 VIEW COMPARISON:  PA and lateral chest x-ray of June 13, 2015 FINDINGS: The lungs are adequately inflated. The interstitial markings have become more conspicuous bilaterally. The cardiac silhouette is top-normal in size. The central pulmonary vascularity is prominent. The mediastinum is normal in width. There is a pleural effusion on the right. The left lateral costophrenic angle is excluded from the study. The bony thorax is  unremarkable where visualized. IMPRESSION: The study is limited by patient positioning. Worsening of bilateral interstitial infiltrates has occurred. A small right pleural effusion is present and more conspicuous than on the earlier study. Electronically Signed   By: David  Martinique M.D.   On: 06/15/2015 07:23   US Thoracentesis Asp Pleural Space W/img Guide  06/21/2015  INDICATION: Pneumonia. Right-sided pleural effusion. Request diagnostic and therapeutic thoracentesis. EXAM: ULTRASOUND GUIDED RIGHT THORACENTESIS MEDICATIONS: None. COMPLICATIONS: None immediate. PROCEDURE: An ultrasound guided thoracentesis was thoroughly discussed with the patient and questions answered. The benefits, risks, alternatives and complications were also discussed. The patient understands and wishes to proceed with the procedure. Written consent was obtained. Ultrasound was performed to localize and mark an adequate pocket of fluid in the right chest. The area was then prepped and draped in the normal sterile fashion. 1% Lidocaine was used for local anesthesia. Under ultrasound guidance a 7 cm, 19 gauge, Yueh catheter was introduced. Thoracentesis was performed. The catheter was removed and a dressing applied. FINDINGS: A total of approximately 700 mL of clear, amber colored fluid was removed. Samples were sent to the laboratory as requested by the clinical team. IMPRESSION: Successful ultrasound guided right thoracentesis yielding 700 mL of pleural fluid. Read by: Ascencion Dike PA-C Electronically Signed   By: Lucrezia Europe M.D.   On: 06/21/2015 15:12    Medical  Consultants:    Interventional Radiology  Anti-Infectives:   Azithro 3/26 > 3/29 Rocephin 3/26 > 4/2 Cefepime/Vanc 4/2 >06/23/15  Subjective:   CHAUNCEY SCIULLI slipped a bit better last night, although he did wake up around 1:30 in the morning, he went back to sleep. Seems a bit more awake today, ambulating with PT. Denies dyspnea. Appetite is good.  Objective:    Filed Vitals:   06/23/15 2106 06/24/15 0324 06/24/15 0329 06/24/15 1311  BP: 112/71  122/74 104/59  Pulse: 68  52 71  Temp: 97.4 F (36.3 C)   97.7 F (36.5 C)  TempSrc: Oral  Oral Oral  Resp: '18  18 17  '$ Height:      Weight:  84.687 kg (186 lb 11.2 oz)    SpO2: 98%  99% 98%    Intake/Output Summary (Last 24 hours) at 06/24/15 1413 Last data filed at 06/24/15 1300  Gross per 24 hour  Intake    720 ml  Output   1450 ml  Net   -730 ml   Filed Weights   06/22/15 0254 06/23/15 0600 06/24/15 0324  Weight: 87 kg (191 lb 12.8 oz) 85.8 kg (189 lb 2.5 oz) 84.687 kg (186 lb 11.2 oz)    Exam: Gen:  Weak elderly male in no acute distress Cardiovascular:  RRR, No M/R/G Respiratory:  Lungs clear today Gastrointestinal:  Abdomen soft, NT/ND, + BS Extremities:  No C/E/C   Data Reviewed:    Labs: Basic Metabolic Panel:  Recent Labs Lab 06/18/15 0742 06/19/15 0235 06/20/15 0227 06/22/15 0244 06/23/15 0329  NA 134* 134* 135 136 137  K 4.4 4.5 4.5 4.0 4.5  CL 96* 96* 98* 98* 95*  CO2 '29 27 29 26 30  '$ GLUCOSE 302* 340* 247* 120* 59*  BUN 36* 45* 42* 33* 30*  CREATININE 1.30* 1.21 1.20 1.23 1.32*  CALCIUM 8.4* 8.5* 8.3* 8.1* 8.4*  MG 1.5* 1.9  --   --   --   PHOS  --  3.4  --   --   --    GFR Estimated Creatinine Clearance: 48.2 mL/min (by  C-G formula based on Cr of 1.32). Liver Function Tests:  Recent Labs Lab 06/18/15 0742 06/19/15 0235  AST 35  --   ALT 38  --   ALKPHOS 131*  --   BILITOT 1.4*  --   PROT 5.4*  --   ALBUMIN 2.4* 2.4*   Coagulation profile  Recent Labs Lab  06/20/15 0227 06/21/15 0438 06/22/15 0244 06/23/15 0329 06/24/15 0212  INR 1.91* 2.37* 2.48* 2.33* 2.47*    CBC:  Recent Labs Lab 06/18/15 0742 06/20/15 0227 06/22/15 0244 06/23/15 0329  WBC 21.0* 24.2* 19.9* 22.5*  NEUTROABS 18.8* 21.2*  --   --   HGB 15.8 16.0 16.1 16.5  HCT 45.4 47.5 48.4 48.9  MCV 88.0 90.0 88.8 90.7  PLT 424* 424* 334 337   Cardiac Enzymes: No results for input(s): CKTOTAL, CKMB, CKMBINDEX, TROPONINI in the last 168 hours. CBG:  Recent Labs Lab 06/23/15 1117 06/23/15 1655 06/23/15 2103 06/24/15 0633 06/24/15 1134  GLUCAP 103* 88 93 107* 103*   Sepsis Labs:  Recent Labs Lab 06/18/15 0742 06/20/15 0227 06/22/15 0244 06/23/15 0329  WBC 21.0* 24.2* 19.9* 22.5*   Microbiology Recent Results (from the past 240 hour(s))  Culture, body fluid-bottle     Status: None (Preliminary result)   Collection Time: 06/21/15  3:26 PM  Result Value Ref Range Status   Specimen Description FLUID RIGHT PLEURAL  Final   Special Requests BOTTLES DRAWN AEROBIC AND ANAEROBIC 10CC  Final   Culture NO GROWTH 3 DAYS  Final   Report Status PENDING  Incomplete  Gram stain     Status: None   Collection Time: 06/21/15  3:26 PM  Result Value Ref Range Status   Specimen Description FLUID RIGHT PLEURAL  Final   Special Requests NONE  Final   Gram Stain   Final    FEW WBC PRESENT, PREDOMINANTLY MONONUCLEAR NO ORGANISMS SEEN    Report Status 06/21/2015 FINAL  Final     Medications:   . antiseptic oral rinse  7 mL Mouth Rinse BID  . atorvastatin  20 mg Oral q morning - 10a  . dextromethorphan-guaiFENesin  1 tablet Oral BID  . diltiazem  240 mg Oral Daily  . insulin aspart  0-15 Units Subcutaneous TID WC  . insulin aspart  0-5 Units Subcutaneous QHS  . insulin aspart  5 Units Subcutaneous TID WC  . insulin glargine  15 Units Subcutaneous QHS  . levothyroxine  112 mcg Oral QAC breakfast  . metoprolol tartrate  12.5 mg Oral BID  . omega-3 acid ethyl esters   1 g Oral Daily  . pantoprazole  40 mg Oral Daily  . traZODone  50 mg Oral QHS  . warfarin  5 mg Oral ONCE-1800  . Warfarin - Pharmacist Dosing Inpatient   Does not apply q1800   Continuous Infusions: . sodium chloride 100 mL/hr at 06/24/15 0758    Time spent: 25 minutes   LOS: 11 days   [facility] Pager [phone]. If unable to reach me by pager, please call my cell phone at [phone].  *Please refer to [email or web address], password [email or web address] to get updated schedule on who will round on this patient, as hospitalists switch teams weekly. If 7PM-7AM, please contact night-coverage at [email or web address], password [email or web address] for any overnight needs.  06/24/2015, 2:13 PM

## 2015-06-24 NOTE — Progress Notes (Signed)
[place] for Coumadin Indication: atrial fibrillation  No Known Allergies  Patient Measurements: Height: 6' (182.9 cm) Weight: 186 lb 11.2 oz (84.687 kg) IBW/kg (Calculated) : 77.6  Vital Signs: Temp Source: Oral (04/06 0329) BP: 122/74 mmHg (04/06 0329) Pulse Rate: 52 (04/06 0329)  Labs:  Recent Labs  06/22/15 0244 06/23/15 0329 06/24/15 0212  HGB 16.1 16.5  --   HCT 48.4 48.9  --   PLT 334 337  --   LABPROT 26.5* 25.3* 26.5*  INR 2.48* 2.33* 2.47*  CREATININE 1.23 1.32*  --     Estimated Creatinine Clearance: 48.2 mL/min (by C-G formula based on Cr of 1.32).  Assessment: 82 y.o. M 0n warfarin PTA for afib. INR remains at goal (regmien has been reduced from home dose). Warfarin sensitivity make be due to antibiotics.  Home regimen: Warfarin 7.'5mg'$  daily exc for '5mg'$  on Thurs   Goal of Therapy:  INR 2-3 Monitor platelets by anticoagulation protocol: Yes    Plan:  Warfarin '5mg'$  po x 1 dose Daily INR  Hildred Laser, Pharm D 06/24/2015 10:32 AM

## 2015-06-24 NOTE — Care Management Important Message (Signed)
Important Message  Patient Details  Name: Justin Garner MRN: [id number] Date of Birth: 14-Nov-1932   Medicare Important Message Given:  Yes    Karita Dralle Abena 06/24/2015, 11:06 AM

## 2015-06-24 NOTE — Progress Notes (Signed)
Notified by CCMD pt HR dropping to 40-50's. RN assessed pt, he is sleeping but arousable. HR came up immediately to 60-70's on arousal. Will continue to monitor.

## 2015-06-25 ENCOUNTER — Inpatient Hospital Stay
Admission: RE | Admit: 2015-06-25 | Discharge: 2015-07-09 | Disposition: A | Discharge status: Home / Self Care | Payer: Medicare Other | Source: Ambulatory Visit | Attending: *Deleted | Admitting: *Deleted

## 2015-06-25 DIAGNOSIS — Z8701 Personal history of pneumonia (recurrent): Principal | ICD-10-CM

## 2015-06-25 DIAGNOSIS — J418 Mixed simple and mucopurulent chronic bronchitis: Secondary | ICD-10-CM

## 2015-06-25 LAB — GLUCOSE, CAPILLARY
GLUCOSE-CAPILLARY: 104 mg/dL — AB (ref 65–99)
Glucose-Capillary: 100 mg/dL — ABNORMAL HIGH (ref 65–99)

## 2015-06-25 LAB — BASIC METABOLIC PANEL
ANION GAP: 8 (ref 5–15)
BUN: 22 mg/dL — ABNORMAL HIGH (ref 6–20)
CALCIUM: 7.9 mg/dL — AB (ref 8.9–10.3)
CHLORIDE: 104 mmol/L (ref 101–111)
CO2: 24 mmol/L (ref 22–32)
CREATININE: 1.17 mg/dL (ref 0.61–1.24)
GFR calc non Af Amer: 57 mL/min — ABNORMAL LOW (ref >60)
Glucose, Bld: 141 mg/dL — ABNORMAL HIGH (ref 65–99)
Potassium: 4.4 mmol/L (ref 3.5–5.1)
SODIUM: 136 mmol/L (ref 135–145)

## 2015-06-25 LAB — PROTEIN ELECTROPHORESIS, SERUM
A/G RATIO SPE: 1 (ref 0.7–1.7)
ALPHA-1-GLOBULIN: 0.1 g/dL (ref 0.0–0.4)
Albumin ELP: 2.4 g/dL — ABNORMAL LOW (ref 2.9–4.4)
Alpha-2-Globulin: 0.8 g/dL (ref 0.4–1.0)
Beta Globulin: 0.8 g/dL (ref 0.7–1.3)
GLOBULIN, TOTAL: 2.3 g/dL (ref 2.2–3.9)
Gamma Globulin: 0.6 g/dL (ref 0.4–1.8)
TOTAL PROTEIN ELP: 4.7 g/dL — AB (ref 6.0–8.5)

## 2015-06-25 LAB — CBC
HCT: 44.3 % (ref 39.0–52.0)
HEMOGLOBIN: 14.6 g/dL (ref 13.0–17.0)
MCH: 29.5 pg (ref 26.0–34.0)
MCHC: 33 g/dL (ref 30.0–36.0)
MCV: 89.5 fL (ref 78.0–100.0)
Platelets: 306 10*3/uL (ref 150–400)
RBC: 4.95 MIL/uL (ref 4.22–5.81)
RDW: 15.3 % (ref 11.5–15.5)
WBC: 18.7 10*3/uL — AB (ref 4.0–10.5)

## 2015-06-25 LAB — PROTIME-INR
INR: 2.44 — AB (ref 0.00–1.49)
PROTHROMBIN TIME: 26.2 s — AB (ref 11.6–15.2)

## 2015-06-25 LAB — HEPATITIS C ANTIBODY (REFLEX): HCV Ab: <0.1 s/co ratio (ref 0.0–0.9)

## 2015-06-25 LAB — HCV COMMENT:

## 2015-06-25 MED ORDER — TRAZODONE HCL 50 MG PO TABS: 50.0000 mg | ORAL_TABLET | Freq: Every evening | ORAL | Status: DC | PRN

## 2015-06-25 MED ORDER — IPRATROPIUM BROMIDE 0.02 % IN SOLN: 0.5000 mg | RESPIRATORY_TRACT | Status: DC | PRN

## 2015-06-25 MED ORDER — METOPROLOL TARTRATE 25 MG PO TABS: 12.5000 mg | ORAL_TABLET | Freq: Two times a day (BID) | ORAL | Status: DC

## 2015-06-25 MED ORDER — LEVALBUTEROL HCL 0.63 MG/3ML IN NEBU: 0.6300 mg | INHALATION_SOLUTION | RESPIRATORY_TRACT | Status: DC | PRN

## 2015-06-25 MED ORDER — FUROSEMIDE 20 MG PO TABS: 20.0000 mg | ORAL_TABLET | Freq: Every day | ORAL | Status: DC | PRN

## 2015-06-25 MED ORDER — DM-GUAIFENESIN ER 30-600 MG PO TB12: 1.0000 | ORAL_TABLET | Freq: Two times a day (BID) | ORAL | Status: DC | PRN

## 2015-06-25 MED ORDER — WARFARIN SODIUM 5 MG PO TABS: 5.0000 mg | ORAL_TABLET | Freq: Once | ORAL | Status: DC

## 2015-06-25 NOTE — Progress Notes (Signed)
[place] for Coumadin Indication: atrial fibrillation  No Known Allergies  Patient Measurements: Height: 6' (182.9 cm) Weight: 186 lb 15.2 oz (84.8 kg) IBW/kg (Calculated) : 77.6  Vital Signs: Temp: 97.8 F (36.6 C) (04/07 0449) Temp Source: Oral (04/07 0449) BP: 101/63 mmHg (04/07 0449) Pulse Rate: 60 (04/07 0449)  Labs:  Recent Labs  06/23/15 0329 06/24/15 0212 06/25/15 0330  HGB 16.5  --  14.6  HCT 48.9  --  44.3  PLT 337  --  306  LABPROT 25.3* 26.5* 26.2*  INR 2.33* 2.47* 2.44*  CREATININE 1.32*  --  1.17    Estimated Creatinine Clearance: 54.3 mL/min (by C-G formula based on Cr of 1.17).  Assessment: 82 y.o. M 0n warfarin PTA for afib. INR remains at goal (regmien has been reduced from home dose). Warfarin sensitivity make be due to recent antibiotics.  Home regimen: Warfarin 7.'5mg'$  daily exc for '5mg'$  on Thurs   Goal of Therapy:  INR 2-3 Monitor platelets by anticoagulation protocol: Yes    Plan:  Warfarin '5mg'$  po x 1 dose Daily INR  Hildred Laser, Pharm D 06/25/2015 9:59 AM

## 2015-06-25 NOTE — Discharge Summary (Addendum)
Physician Discharge Summary  SKIP LITKE PYP:[id number] DOB: 1933-03-07 DOA: 06/13/2015  PCP: Kandice Hams, MD  Admit date: 06/13/2015 Discharge date: 06/25/2015   Recommendations for Outpatient Follow-Up:   1. Recommend outpatient follow-up with oncology given weight loss and persistent leukocytosis. 2. Please follow-up Pleural fluid cultures which have remained negative to date but are not finalized. 3. Recommend checking CBC/BMET/INR every 3 days.  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)    CAP (community acquired pneumonia)    Chronic systolic CHF (congestive heart failure) (HCC)    Acute on chronic kidney failure-II    Hypothyroidism    HLD (hyperlipidemia)    COPD exacerbation (HCC)    Pleural effusion, right    CAD in native artery    Hypomagnesemia    Controlled diabetes mellitus type 2 with complications ([place])    Acute kidney injury ([place])    Acute respiratory failure with hypoxia (HCC)    Cardiomyopathy (HCC)    Chronic atrial fibrillation with RVR (HCC)    Supratherapeutic INR    Type 2 diabetes mellitus with complication (HCC)    Respiratory acidosis    Chronic obstructive pulmonary disease (HCC)    Chronic diastolic CHF (congestive heart failure) (HCC)    Secondary cardiomyopathy ([place])    Chronic atrial fibrillation (HCC)    Pleural effusion on right    Leukocytosis   Discharge disposition:  SNF: [facility].  Discharge Condition: Improved.  Diet recommendation: Low sodium, heart healthy.  Carbohydrate-modified.     History of Present Illness:   Justin Garner is an 82 y.o. male with Hx of hypertension, hyperlipidemia, COPD, GERD, hypothyroidism, CAD, pulmonary embolism, PAF on Coumadin, systolic congestive heart failure with EF 45%, chronic kidney disease stage II, right upper lobe lung cancer (S/P surgery, radiation therapy and chemotherapy, followed up with oncologist for 5 years, currently not seeing oncologist), who presented with productive cough, fever and shortness of breath. In the ED, patient was found to have WBC 11.7, lactate of 2.47, temperature 102.4, tachycardia, tachypnea, positive urinalysis, potassium 2.9, worsening renal function. Oxygen desaturation to 89% on room air. Chest x-ray showed chronic changes similar to that noted on the prior exam, no acute infiltrate.  Hospital Course by Problem:   Principal Problem: Acute on chronic respiratory failure with hypoxia (HCC)/community-acquired pneumonia/small right pleural effusion Chest x-ray on admission negative for infiltrate, but Subsequent x-ray showed bilateral worsening infiltrates. There may also be contributions from COPD/CHF. Both influenza panel and respiratory virus panels were negative. Blood cultures were negative. Sputum cultures grew normal oral pharyngeal flora. Given persistent/rising leukocytosis, and hypoxia with activity/lack of clinical improvement, a CT chest was done 06/20/15 to r/o empyema/abscess. Moderate pleural effusion on the right noted, and findings most consistent with pneumonia although lymphangitic spread of lung cancer or atelectasis in the differential. 700 mL thoracentesis done 06/21/15. Pleural fluid studies consistent with an exudate. Antibiotics broadened to Cefepime and Vancomycin 06/19/25, subsequently discontinued 06/23/15 upon the advice of ID. Pleural fluid culture negative to date. Cytology showed reactive mesothelial cells.  Will likely need outpatient pulmonology/oncology follow-up.  Active  Problems: COPD (chronic obstructive pulmonary disease) with emphysema (HCC)/COPD exacerbation Completed a course of steroids. Continue bronchodilators as needed.  Atrial fibrillation with RVR (HCC)/chronic atrial fibrillation CHA2DS2-VASc Score is 5. Continue metoprolol for rate control. Coumadin resumed. INR therapeutic.  Coronary atherosclerosis of native coronary artery Stable. Continue Lipitor.  Essential hypertension, benign Blood pressure controlled.  Lung cancer, upper lobe ([place]) Status post VATS, thoracotomy in 2009. CT of the chest showed mixed interstitial airspace opacity occasion over the right lower lobe which may be due to atelectasis, infection, or lymphangitic progression of disease. Patchy opacity occasion over the left lower lobe and minimally in the central left midlung. Cytology showed only reactive mesothelial cells. Recommend outpatient oncology follow-up given weight loss and abnormal CT.  Hypokalemia Potassium repleted. Remains WNL.  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 Inadequate glycemic control noted with steroids requiring treatment with insulin while in the hospital. Can resume pre-admission regimen at discharge.  Chronic systolic CHF (congestive heart failure) ([place]) /cardiomyopathy 2-D echo done 07/18/13 showed EF 45%. Repeat echo done 06/16/15 with EF 50-55 percent. Patient remains at baseline weight. Creatinine remains stable. Routine Lasix discontinued, but can give a dose as needed for shortness of breath, weight gain or swelling.  Acute on chronic kidney failure-II/acute kidney injury Creatinine slightly up today. Diuretics remain on hold. Creatinine improved at discharge.  Hypothyroidism Continue Synthroid.  HLD (hyperlipidemia) Continue Lipitor.  CAD in native artery Troponins cycled. 0.03, 0.04, 0.03.  Hypomagnesemia Resolved with supplementation.    Medical  Consultants:    Infectious Disease   Discharge Exam:   Filed Vitals:   06/24/15 2240 06/25/15 0449  BP: 103/73 101/63  Pulse: 72 60  Temp:  97.8 F (36.6 C)  Resp:  16   Filed Vitals:   06/24/15 1311 06/24/15 2029 06/24/15 2240 06/25/15 0449  BP: 104/59 134/70 103/73 101/63  Pulse: 71 74 72 60  Temp: 97.7 F (36.5 C) 97.7 F (36.5 C)  97.8 F (36.6 C)  TempSrc: Oral Oral  Oral  Resp: '17 18  16  '$ Height:      Weight:    84.8 kg (186 lb 15.2 oz)  SpO2: 98% 98%  94%    Gen:  NAD, Frail/weak Cardiovascular:  RRR, No M/R/G Respiratory: Lungs CTAB Gastrointestinal: Abdomen soft, NT/ND with normal active bowel sounds. Extremities: No C/E/C   The results of significant diagnostics from this hospitalization (including imaging, microbiology, ancillary and laboratory) are listed below for reference.     Procedures and Diagnostic Studies:   Dg Chest 2 View  06/13/2015  CLINICAL DATA:  Cough and fever for 3 days, initial encounter EXAM: CHEST  2 VIEW COMPARISON:  08/21/2014 FINDINGS: Cardiac shadow is stable. The lungs are well aerated bilaterally and demonstrate some chronic changes similar to that seen on the prior exam. No focal infiltrate or sizable effusion is noted. No acute bony abnormality is seen. IMPRESSION: Chronic changes similar to that noted on the prior exam. No acute infiltrate is seen. Electronically Signed   By: Inez Catalina M.D.   On: 06/13/2015 19:32     Labs:   Basic Metabolic Panel:  Recent Labs Lab 06/19/15 0235 06/20/15 0227 06/22/15 0244 06/23/15 0329 06/25/15 0330  NA 134* 135 136 137 136  K 4.5 4.5 4.0 4.5 4.4  CL 96* 98* 98* 95* 104  CO2 '27 29 26 30 24  '$ GLUCOSE  340* 247* 120* 59* 141*  BUN 45* 42* 33* 30* 22*  CREATININE 1.21 1.20 1.23 1.32* 1.17  CALCIUM 8.5* 8.3* 8.1* 8.4* 7.9*  MG 1.9  --   --   --   --   PHOS 3.4  --   --   --   --    GFR Estimated Creatinine Clearance: 54.3 mL/min (by C-G formula based on Cr of 1.17). Liver  Function Tests:  Recent Labs Lab 06/19/15 0235  ALBUMIN 2.4*   Coagulation profile  Recent Labs Lab 06/21/15 0438 06/22/15 0244 06/23/15 0329 06/24/15 0212 06/25/15 0330  INR 2.37* 2.48* 2.33* 2.47* 2.44*    CBC:  Recent Labs Lab 06/20/15 0227 06/22/15 0244 06/23/15 0329 06/25/15 0330  WBC 24.2* 19.9* 22.5* 18.7*  NEUTROABS 21.2*  --   --   --   HGB 16.0 16.1 16.5 14.6  HCT 47.5 48.4 48.9 44.3  MCV 90.0 88.8 90.7 89.5  PLT 424* 334 337 306   CBG:  Recent Labs Lab 06/24/15 0633 06/24/15 1134 06/24/15 1623 06/24/15 2131 06/25/15 0614  GLUCAP 107* 103* 81 176* 104*   Microbiology Recent Results (from the past 240 hour(s))  Culture, body fluid-bottle     Status: None (Preliminary result)   Collection Time: 06/21/15  3:26 PM  Result Value Ref Range Status   Specimen Description FLUID RIGHT PLEURAL  Final   Special Requests BOTTLES DRAWN AEROBIC AND ANAEROBIC 10CC  Final   Culture NO GROWTH 3 DAYS  Final   Report Status PENDING  Incomplete  Gram stain     Status: None   Collection Time: 06/21/15  3:26 PM  Result Value Ref Range Status   Specimen Description FLUID RIGHT PLEURAL  Final   Special Requests NONE  Final   Gram Stain   Final    FEW WBC PRESENT, PREDOMINANTLY MONONUCLEAR NO ORGANISMS SEEN    Report Status 06/21/2015 FINAL  Final     Discharge Instructions:       Discharge Instructions    (HEART FAILURE PATIENTS) Call MD:  Anytime you have any of the following symptoms: 1) 3 pound weight gain in 24 hours or 5 pounds in 1 week 2) shortness of breath, with or without a dry hacking cough 3) swelling in the hands, feet or stomach 4) if you have to sleep on extra pillows at night in order to breathe.    Complete by:  As directed      Call MD for:  extreme fatigue    Complete by:  As directed      Call MD for:  severe uncontrolled pain    Complete by:  As directed      Call MD for:  temperature >100.4    Complete by:  As directed      Diet -  low sodium heart healthy    Complete by:  As directed      Diet Carb Modified    Complete by:  As directed      Increase activity slowly    Complete by:  As directed      Walk with assistance    Complete by:  As directed      Walker     Complete by:  As directed             Medication List    STOP taking these medications        guaifenesin 100 MG/5ML syrup  Commonly known as:  ROBITUSSIN  TAKE these medications        albuterol 108 (90 Base) MCG/ACT inhaler  Commonly known as:  PROVENTIL HFA;VENTOLIN HFA  Inhale 2 puffs into the lungs every 6 (six) hours as needed for wheezing or shortness of breath.     atorvastatin 20 MG tablet  Commonly known as:  LIPITOR  TAKE 1 BY MOUTH DAILY     dextromethorphan-guaiFENesin 30-600 MG 12hr tablet  Commonly known as:  MUCINEX DM  Take 1 tablet by mouth 2 (two) times daily as needed for cough.     diltiazem 240 MG 24 hr capsule  Commonly known as:  CARDIZEM CD  Take 1 capsule (240 mg total) by mouth daily.     fish oil-omega-3 fatty acids 1000 MG capsule  Take 1 g by mouth 2 (two) times daily.     furosemide 20 MG tablet  Commonly known as:  LASIX  Take 1 tablet (20 mg total) by mouth daily as needed for fluid or edema (Shortness of breath).     glipiZIDE 10 MG 24 hr tablet  Commonly known as:  GLUCOTROL XL  Take 10 mg by mouth daily with breakfast.     ipratropium 0.02 % nebulizer solution  Commonly known as:  ATROVENT  Take 2.5 mLs (0.5 mg total) by nebulization every 4 (four) hours as needed for shortness of breath.     JANUVIA 50 MG tablet  Generic drug:  sitaGLIPtin  Take 50 mg by mouth every morning.     levalbuterol 0.63 MG/3ML nebulizer solution  Commonly known as:  XOPENEX  Take 3 mLs (0.63 mg total) by nebulization every 4 (four) hours as needed for wheezing or shortness of breath.     levothyroxine 112 MCG tablet  Commonly known as:  SYNTHROID, LEVOTHROID  Take 112 mcg by mouth daily.      metoprolol tartrate 25 MG tablet  Commonly known as:  LOPRESSOR  Take 0.5 tablets (12.5 mg total) by mouth 2 (two) times daily.     nitroGLYCERIN 0.4 MG SL tablet  Commonly known as:  NITROSTAT  Place 0.4 mg under the tongue every 5 (five) minutes as needed for chest pain.     omeprazole 20 MG capsule  Commonly known as:  PRILOSEC  Take 20 mg by mouth daily.     traZODone 50 MG tablet  Commonly known as:  DESYREL  Take 1 tablet (50 mg total) by mouth at bedtime as needed for sleep.     warfarin 5 MG tablet  Commonly known as:  COUMADIN  Take as directed by [facility]       Follow-up Information    Follow up with [facility].   Why:  Registered Nurse, Physical/Occupational Therapy, Social Worker, Aide   Contact information:   [place] [address] [phone]       Follow up with [facility].   Why:  [facility].    Contact information:   [address] [place] [address] [phone]       Follow up with Kandice Hams, MD. Schedule an appointment as soon as possible for a visit in 2 weeks.   Specialty:  Internal Medicine   Why:  Hospital follow up, needs outpatient referral back to oncology to eval persistent leukocytosis and weight loss.   Contact information:   [address] [place] [address] [phone]        Time coordinating discharge: 2  minutes.  Signed:  Lillyana Majette  Pager [phone] [facility] 06/25/2015, 10:16 AM

## 2015-06-25 NOTE — Care Management Note (Signed)
Case Management Note Previous CM note initiated by  Jacqlyn Krauss RN, CM  Patient Details  Name: Justin Garner MRN: [id number] Date of Birth: 16-Apr-1932  Subjective/Objective: Pt admitted for Acute on Chronic Respiratory Failure. Pt is from home with wife. Plan for thoracentesis 06-21-15.                    Action/Plan: CM did speak with pt/ wife in regards to disposition needs. Pt states he would rather d/c to home once stable. CM did discuss home and wife is retired and will be there 24/7 days week. Has additional help from granddaughter, however she does work. Wife still deciding if the plan will be home vs SNF. CSW Percell Locus did speak with family as well and needed to some time to think through options. Choice provided to family. Pt chose [place]. CM did make [place] referral with [facility]. DME referral provided to [facility] for [facility]. Will not deliver until definite decision made. Pt may need 02 once stable as well. Pt will need orders if plan continues to be for home.  No further needs from CM at this time.    Expected Discharge Date:  06/25/15             Expected Discharge Plan:  [place]  In-House Referral:  Clinical Social Work  Discharge planning Services  CM Consult  Post Acute Care Choice:  Home Health, Durable Medical Equipment Choice offered to:  Patient, Spouse  DME Arranged:  [place] rolling with seat DME Agency:  [facility]. Brenton Grills is aware with [facility]- awaiting to deliver for decision of Home vs SNF. )  HH Arranged:  RN, PT, Nurse's Aide, Social Work, OT [employer] Agency:  [employer] (Referral provided to [place])  Status of Service:  Completed, signed off  Medicare Important Message Given:  Yes Date Medicare IM Given:    Medicare IM give by:    Date Additional Medicare IM Given:    Additional Medicare Important Message give by:     If discussed at [place] of Stay Meetings, dates discussed:  06/24/15  Discharge  Disposition: skilled facility   Additional Comments:  06/25/15- 1000- [name] RN, BSN- pt for d/c today- pt/wife have decided to go to [facility]NF prior to returning home- have chosen [facility] for placement-CSW following for placement needs- CM has notified [name] with [facility] of d/c plans for SNF- no HH needs will be needed at this time.   [name], RN 06/25/2015, 10:27 AM

## 2015-06-25 NOTE — Progress Notes (Signed)
Called report to [facility] [place] and gave report to [place].

## 2015-06-25 NOTE — Clinical Social Work Placement (Signed)
   CLINICAL SOCIAL WORK PLACEMENT  NOTE  Date:  06/25/2015  Patient Details  Name: Justin Garner MRN: [id number] Date of Birth: [birth year]  Clinical Social Work is seeking post-discharge placement for this patient at the [address] level of care (*CSW will initial, date and re-position this form in  chart as items are completed):      Patient/family provided with [place] Work Department's list of facilities offering this level of care within the geographic area requested by the patient (or if unable, by the patient's family).      Patient/family informed of their freedom to choose among providers that offer the needed level of care, that participate in Medicare, Medicaid or managed care program needed by the patient, have an available bed and are willing to accept the patient.      Patient/family informed of [facility]'s ownership interest in [facility] and [facility], as well as of the fact that they are under no obligation to receive care at these facilities.  PASRR submitted to EDS on 06/21/15     PASRR number received on 06/21/15     Existing PASRR number confirmed on       FL2 transmitted to all facilities in geographic area requested by pt/family on 06/21/15     FL2 transmitted to all facilities within larger geographic area on       Patient informed that his/her managed care company has contracts with or will negotiate with certain facilities, including the following:        Yes   Patient/family informed of bed offers received.  Patient [facility] bed [facility]     Physician recommends and patient chooses bed at      Patient to be transferred to [facility] on 06/25/15.  Patient to be transferred to facility by Ambulance     Patient family notified on 06/25/15 of transfer.  Name of family member notified:  Joy     PHYSICIAN Please sign FL2, Please sign DNR, Please prepare priority discharge summary,  including medications, Please prepare prescriptions     Additional Comment:   Per MD patient ready for DC to [facility]. RN, patient, patient's family, and facility notified of DC. RN given number for report. DC packet on chart. Ambulance transport requested for patient. CSW signing off.  _______________________________________________ Liz Beach MSW, [place], [place], [phone]

## 2015-06-26 ENCOUNTER — Encounter (HOSPITAL_COMMUNITY)
Admission: RE | Admit: 2015-06-26 | Discharge: 2015-06-26 | Disposition: A | Discharge status: Home / Self Care | Payer: Medicare Other | Source: Skilled Nursing Facility | Attending: Internal Medicine | Admitting: Internal Medicine

## 2015-06-26 DIAGNOSIS — C131 Malignant neoplasm of aryepiglottic fold, hypopharyngeal aspect: Secondary | ICD-10-CM | POA: Insufficient documentation

## 2015-06-26 DIAGNOSIS — J90 Pleural effusion, not elsewhere classified: Secondary | ICD-10-CM | POA: Insufficient documentation

## 2015-06-26 DIAGNOSIS — E1122 Type 2 diabetes mellitus with diabetic chronic kidney disease: Secondary | ICD-10-CM | POA: Insufficient documentation

## 2015-06-26 DIAGNOSIS — J449 Chronic obstructive pulmonary disease, unspecified: Secondary | ICD-10-CM | POA: Insufficient documentation

## 2015-06-26 DIAGNOSIS — J9621 Acute and chronic respiratory failure with hypoxia: Secondary | ICD-10-CM | POA: Insufficient documentation

## 2015-06-26 DIAGNOSIS — N179 Acute kidney failure, unspecified: Secondary | ICD-10-CM | POA: Insufficient documentation

## 2015-06-26 DIAGNOSIS — Z79899 Other long term (current) drug therapy: Secondary | ICD-10-CM | POA: Insufficient documentation

## 2015-06-26 DIAGNOSIS — K219 Gastro-esophageal reflux disease without esophagitis: Secondary | ICD-10-CM | POA: Insufficient documentation

## 2015-06-26 DIAGNOSIS — I482 Chronic atrial fibrillation: Secondary | ICD-10-CM | POA: Insufficient documentation

## 2015-06-26 LAB — CBC WITH DIFFERENTIAL/PLATELET
BASOS ABS: 0 10*3/uL (ref 0.0–0.1)
BASOS PCT: 0 %
EOS PCT: 0 %
Eosinophils Absolute: 0.1 10*3/uL (ref 0.0–0.7)
HEMATOCRIT: 43.5 % (ref 39.0–52.0)
Hemoglobin: 14.4 g/dL (ref 13.0–17.0)
Lymphocytes Relative: 9 %
Lymphs Abs: 1.7 10*3/uL (ref 0.7–4.0)
MCH: 30 pg (ref 26.0–34.0)
MCHC: 33.1 g/dL (ref 30.0–36.0)
MCV: 90.6 fL (ref 78.0–100.0)
MONO ABS: 3 10*3/uL — AB (ref 0.1–1.0)
MONOS PCT: 16 %
NEUTROS ABS: 13.5 10*3/uL — AB (ref 1.7–7.7)
Neutrophils Relative %: 75 %
PLATELETS: 332 10*3/uL (ref 150–400)
RBC: 4.8 MIL/uL (ref 4.22–5.81)
RDW: 15.3 % (ref 11.5–15.5)
WBC: 18.3 10*3/uL — ABNORMAL HIGH (ref 4.0–10.5)

## 2015-06-26 LAB — BASIC METABOLIC PANEL
ANION GAP: 5 (ref 5–15)
BUN: 22 mg/dL — AB (ref 6–20)
CALCIUM: 7.7 mg/dL — AB (ref 8.9–10.3)
CO2: 25 mmol/L (ref 22–32)
CREATININE: 1.26 mg/dL — AB (ref 0.61–1.24)
Chloride: 104 mmol/L (ref 101–111)
GFR calc Af Amer: 60 mL/min — ABNORMAL LOW (ref >60)
GFR, EST NON AFRICAN AMERICAN: 52 mL/min — AB (ref >60)
GLUCOSE: 166 mg/dL — AB (ref 65–99)
Potassium: 4.7 mmol/L (ref 3.5–5.1)
Sodium: 134 mmol/L — ABNORMAL LOW (ref 135–145)

## 2015-06-26 LAB — CULTURE, BODY FLUID-BOTTLE: CULTURE: NO GROWTH

## 2015-06-26 LAB — CULTURE, BODY FLUID W GRAM STAIN -BOTTLE

## 2015-06-26 LAB — PROTIME-INR
INR: 2.28 — AB (ref 0.00–1.49)
Prothrombin Time: 24.9 seconds — ABNORMAL HIGH (ref 11.6–15.2)

## 2015-06-28 ENCOUNTER — Ambulatory Visit (HOSPITAL_COMMUNITY): Payer: Medicare Other | Attending: Internal Medicine

## 2015-06-28 ENCOUNTER — Encounter (HOSPITAL_COMMUNITY)
Admission: RE | Admit: 2015-06-28 | Discharge: 2015-06-28 | Disposition: A | Discharge status: Home / Self Care | Payer: Medicare Other | Source: Skilled Nursing Facility | Attending: Internal Medicine | Admitting: Internal Medicine

## 2015-06-28 DIAGNOSIS — J90 Pleural effusion, not elsewhere classified: Secondary | ICD-10-CM | POA: Diagnosis not present

## 2015-06-28 DIAGNOSIS — Z8701 Personal history of pneumonia (recurrent): Secondary | ICD-10-CM | POA: Insufficient documentation

## 2015-06-28 DIAGNOSIS — J9811 Atelectasis: Secondary | ICD-10-CM | POA: Diagnosis not present

## 2015-06-28 LAB — URINALYSIS W MICROSCOPIC (NOT AT ARMC)
Glucose, UA: NEGATIVE mg/dL
HGB URINE DIPSTICK: NEGATIVE
KETONES UR: NEGATIVE mg/dL
LEUKOCYTES UA: NEGATIVE
NITRITE: NEGATIVE
PROTEIN: NEGATIVE mg/dL
RBC / HPF: NONE SEEN RBC/hpf (ref 0–5)
SPECIFIC GRAVITY, URINE: 1.025 (ref 1.005–1.030)
pH: 5.5 (ref 5.0–8.0)

## 2015-06-28 LAB — BASIC METABOLIC PANEL
ANION GAP: 8 (ref 5–15)
BUN: 23 mg/dL — ABNORMAL HIGH (ref 6–20)
CALCIUM: 7.8 mg/dL — AB (ref 8.9–10.3)
CHLORIDE: 104 mmol/L (ref 101–111)
CO2: 26 mmol/L (ref 22–32)
Creatinine, Ser: 1.26 mg/dL — ABNORMAL HIGH (ref 0.61–1.24)
GFR calc non Af Amer: 52 mL/min — ABNORMAL LOW (ref >60)
GFR, EST AFRICAN AMERICAN: 60 mL/min — AB (ref >60)
GLUCOSE: 52 mg/dL — AB (ref 65–99)
GLUCOSE: 119 mg/dL
GLUCOSE: 163 mg/dL
GLUCOSE: 60 mg/dL
GLUCOSE: 77 mg/dL
Potassium: 3.9 mmol/L (ref 3.5–5.1)
Sodium: 138 mmol/L (ref 135–145)

## 2015-06-28 LAB — PROTIME-INR
INR: 1.9 — AB (ref 0.00–1.49)
PROTHROMBIN TIME: 21.7 s — AB (ref 11.6–15.2)

## 2015-06-28 LAB — CBC
HCT: 41.5 % (ref 39.0–52.0)
HEMOGLOBIN: 14.1 g/dL (ref 13.0–17.0)
MCH: 30.5 pg (ref 26.0–34.0)
MCHC: 34 g/dL (ref 30.0–36.0)
MCV: 89.6 fL (ref 78.0–100.0)
Platelets: 344 10*3/uL (ref 150–400)
RBC: 4.63 MIL/uL (ref 4.22–5.81)
RDW: 16.4 % — AB (ref 11.5–15.5)
WBC: 19.6 10*3/uL — ABNORMAL HIGH (ref 4.0–10.5)

## 2015-06-29 ENCOUNTER — Non-Acute Institutional Stay (SKILLED_NURSING_FACILITY): Payer: Medicare Other | Admitting: Internal Medicine

## 2015-06-29 ENCOUNTER — Encounter: Payer: Self-pay | Admitting: Internal Medicine

## 2015-06-29 ENCOUNTER — Encounter (HOSPITAL_COMMUNITY)
Admission: AD | Admit: 2015-06-29 | Discharge: 2015-06-29 | Disposition: A | Discharge status: Home / Self Care | Payer: Medicare Other | Source: Skilled Nursing Facility | Attending: Internal Medicine | Admitting: Internal Medicine

## 2015-06-29 DIAGNOSIS — J189 Pneumonia, unspecified organism: Secondary | ICD-10-CM | POA: Diagnosis not present

## 2015-06-29 DIAGNOSIS — N182 Chronic kidney disease, stage 2 (mild): Secondary | ICD-10-CM

## 2015-06-29 DIAGNOSIS — I4891 Unspecified atrial fibrillation: Secondary | ICD-10-CM

## 2015-06-29 DIAGNOSIS — C3411 Malignant neoplasm of upper lobe, right bronchus or lung: Secondary | ICD-10-CM

## 2015-06-29 DIAGNOSIS — J9621 Acute and chronic respiratory failure with hypoxia: Secondary | ICD-10-CM

## 2015-06-29 DIAGNOSIS — E1122 Type 2 diabetes mellitus with diabetic chronic kidney disease: Secondary | ICD-10-CM | POA: Diagnosis not present

## 2015-06-29 DIAGNOSIS — I5022 Chronic systolic (congestive) heart failure: Secondary | ICD-10-CM | POA: Diagnosis not present

## 2015-06-29 LAB — BASIC METABOLIC PANEL
Anion gap: 7 (ref 5–15)
BUN: 20 mg/dL (ref 4–21)
BUN: 20 mg/dL (ref 6–20)
CALCIUM: 7.7 mg/dL — AB (ref 8.9–10.3)
CO2: 24 mmol/L (ref 22–32)
CREATININE: 1.2 mg/dL (ref 0.6–1.3)
Chloride: 104 mmol/L (ref 101–111)
Creatinine, Ser: 1.17 mg/dL (ref 0.61–1.24)
GFR calc Af Amer: >60 mL/min (ref >60)
GFR, EST NON AFRICAN AMERICAN: 57 mL/min — AB (ref >60)
GLUCOSE: 137 mg/dL — AB (ref 65–99)
Glucose: 137 mg/dL
Glucose: 64 mg/dL
Potassium: 3.6 mmol/L (ref 3.5–5.1)
SODIUM: 135 mmol/L — AB (ref 137–147)
SODIUM: 135 mmol/L (ref 135–145)

## 2015-06-29 LAB — PROTIME-INR
INR: 2 — ABNORMAL HIGH (ref 0.00–1.49)
PROTIME: 22.5 s — AB (ref 10.0–13.8)
Prothrombin Time: 22.5 seconds — ABNORMAL HIGH (ref 11.6–15.2)

## 2015-06-29 LAB — CBC WITH DIFFERENTIAL/PLATELET
BASOS ABS: 0 10*3/uL (ref 0.0–0.1)
Basophils Relative: 0 %
EOS ABS: 0.1 10*3/uL (ref 0.0–0.7)
Eosinophils Relative: 1 %
HCT: 37.2 % — ABNORMAL LOW (ref 39.0–52.0)
Hemoglobin: 12.5 g/dL — ABNORMAL LOW (ref 13.0–17.0)
LYMPHS PCT: 7 %
Lymphs Abs: 1.1 10*3/uL (ref 0.7–4.0)
MCH: 30.2 pg (ref 26.0–34.0)
MCHC: 33.6 g/dL (ref 30.0–36.0)
MCV: 89.9 fL (ref 78.0–100.0)
MONO ABS: 1.4 10*3/uL — AB (ref 0.1–1.0)
Monocytes Relative: 9 %
NEUTROS ABS: 12 10*3/uL — AB (ref 1.7–7.7)
Neutrophils Relative %: 83 %
PLATELETS: 347 10*3/uL (ref 150–400)
RBC: 4.14 MIL/uL — ABNORMAL LOW (ref 4.22–5.81)
RDW: 15.9 % — AB (ref 11.5–15.5)
WBC: 14.6 10*3/uL — AB (ref 4.0–10.5)

## 2015-06-29 LAB — CBC AND DIFFERENTIAL: WBC: 14.6 10^3/mL

## 2015-06-29 NOTE — Assessment & Plan Note (Signed)
Oncology consultation if weight loss and debilitation failed to resolve

## 2015-06-29 NOTE — Assessment & Plan Note (Signed)
Repeat A1c

## 2015-06-29 NOTE — Assessment & Plan Note (Addendum)
06/29/15 PT/INR 2.00 No change in warfarin; present dose is 5.5 mg daily Check PT/INR 07/02/15

## 2015-06-29 NOTE — Assessment & Plan Note (Addendum)
06/29/15 white blood count 14,600 Patient afebrile 06/28/15 chest x-ray: Chronic emphysematous changes and pulmonary scarring . Persistent small right pleural effusion with overlying atelectasis or scarring

## 2015-06-29 NOTE — Patient Instructions (Addendum)
See summary under each active problem in the Problem List with associated updated therapeutic plan. Completed document transmitted to [facility]. Hemoglobin A1c and PT/INR 14/14/17.

## 2015-06-29 NOTE — Assessment & Plan Note (Signed)
Continue pulmonary toilet Full course of antibiotics completed

## 2015-06-29 NOTE — Progress Notes (Signed)
Patient ID: Justin Garner, male   DOB: March 19, 1933, 82 y.o.   MRN: [id number]     This is a comprehensive admission note to [facility] personally performed by Unice Cobble MD on this date less than 30 days from date of admission. Included are preadmission medical/surgical history;reconciled medication list; family history; social history and comprehensive review of systems.  Corrections and additions to the records were documented . Comprehensive physical exam was also performed. Additionally a clinical summary was entered for each active diagnosis pertinent to this admission in the Problem List to enhance continuity of care.  PCP: [name] MD  HPI: The patient was hospitalized 3/26-06/25/15 with acute on chronic respiratory failure with hypoxia. Presentation was as productive cough; temp to 102.4; and shortness of breath. O2 sats were 89% on room air. This was in context of COPD/emphysema. Initial chest x-ray showed chronic changes and no acute disease. Follow-up film revealed bilateral worsening infiltrates for  which cefepime and vancomycin were prescribed for a possible community-acquired pneumonia.Moderate right effusion present on CT. Thoracentesis 4/3 revealed exudative effusion. Cytology revealed reactive mesothelial cells. Of concern was his slow clinical improvement and persistent leukocytosis. He had a history of R upper lobe cancer in 2009  for which he had lobectomy ;radiation therapy; and chemotherapy. He was followed by an oncologist for 5 years. There is no active oncology follow-up at this time. It was recommended he be re-evaluated if persistent leukocytosis and weight loss present Follow-up the pleural fluid cultures is recommended. At discharge CBC, BMET, INR were recommended every 3 days  Past medical and surgical history: He has history of paroxysmal atrial fibrillation with rapid ventricular response. He is on warfarin for a CHADs 5 . He has diabetes with  chronic kidney disease stage II .He had some acute on chronic kidney insufficiency while hospitalized. last A1c on record was 6.4% on 12/29/14. He was on a sliding scale regimen while hospitalized. He has chronic systolic congestive heart failure in the context of cardiomyopathy. Initial ejection fraction was 45%. He was found to be profoundly hypokalemic with a potassium of 2.9  Social history: He quit smoking in 2003. Does not drink  Family history: Incomplete; updated  Comprehensive review of systems: He now has occasional sputum production which is slightly yellow. His fatigue and generalized weakness has improved significantly. He denies other active symptoms. Constitutional: No fever,significant weight change Eyes: No redness, discharge, pain, vision change ENT/mouth: No nasal congestion,  purulent discharge, earache,change in hearing ,sore throat  Cardiovascular: No chest pain, palpitations,paroxysmal nocturnal dyspnea, claudication  Respiratory: No hemoptysis, significant snoring,apnea  Gastrointestinal: No heartburn,dysphagia,abdominal pain, nausea / vomiting,rectal bleeding, melena,change in bowels Genitourinary: No dysuria,hematuria, pyuria,  incontinence, nocturia Musculoskeletal: No joint stiffness, joint swelling,pain Dermatologic: No rash, pruritus, change in appearance of skin Neurologic: No dizziness,headache,syncope, seizures, numbness , tingling Psychiatric: No significant anxiety , depression, insomnia, anorexia Endocrine: No change in hair/skin/ nails, excessive thirst, excessive hunger, excessive urination  Hematologic/lymphatic: No significant bruising, lymphadenopathy,abnormal bleeding Allergy/immunology: No itchy/ watery eyes, significant sneezing, urticaria, angioedema  Physical exam:  Pertinent or positive findings: Pattern alopecia present. He has complete dentures. Facial rosacea is present. He has a Engineering geologist. He is slightly hard of hearing. Responses are slow but  he is oriented 3. Heart rhythm is irregular-irregular. He had frank rales at the bases which clear with deep inspirations. Bruising seen over the dorsum of the hands. He has stasis hyperpigmentation particularly of the right lower extremity. Venous insufficiency changes are noted over  the lower extremities. He has 1+ edema at the sock line. Pedal pulses are slightly decreased but present. Clubbing of the nailbeds present General appearance:Adequately nourished; no acute distress , increased work of breathing is present.   Lymphatic: No lymphadenopathy about the head, neck, axilla . Eyes: No conjunctival inflammation or lid edema is present. There is no scleral icterus. Ears:  External ear exam shows no significant lesions or deformities.   Nose:  External nasal examination shows no deformity or inflammation. Nasal mucosa are pink and moist without lesions ,exudates Oral exam: lips and gums are healthy appearing.There is no oropharyngeal erythema or exudate . Neck:  No thyromegaly, masses, tenderness noted.    Heart: without gallop, murmur, click, rub .  Lungs: without wheezes, rhonchi, rubs. Abdomen:Bowel sounds are normal. Abdomen is soft and nontender with no organomegaly, hernias,masses. GU: deferred as previously addressed. Extremities:  No cyanosis  Neurologic exam : Strength equal  in upper & lower extremities but decreased ; he is in a wheelchair Balance,Rhomberg,finger to nose testing could not be completed due to clinical state Deep tendon reflexes are equal Skin: Warm & dry w/o tenting. No significant lesions or rash.  See clinical summary under each active problem in the Problem List with associated updated therapeutic plan

## 2015-06-30 ENCOUNTER — Telehealth: Payer: Self-pay | Admitting: Internal Medicine

## 2015-06-30 LAB — URINE CULTURE: CULTURE: NO GROWTH

## 2015-06-30 NOTE — Telephone Encounter (Signed)
Pt is aware of np appt on 07/12/15'@1'$ :54.  Spoke with his nurse at the nursing home. Referring Dr. Linna Darner Dx- persistent leukocytosis Pt saw Dr. Julien Nordmann in 2009

## 2015-07-01 ENCOUNTER — Encounter (HOSPITAL_COMMUNITY)
Admission: AD | Admit: 2015-07-01 | Discharge: 2015-07-01 | Disposition: A | Discharge status: Home / Self Care | Payer: Medicare Other | Source: Skilled Nursing Facility | Attending: *Deleted | Admitting: *Deleted

## 2015-07-01 LAB — PROTIME-INR
INR: 2.28 — ABNORMAL HIGH (ref 0.00–1.49)
Prothrombin Time: 24.9 seconds — ABNORMAL HIGH (ref 11.6–15.2)

## 2015-07-02 ENCOUNTER — Encounter (HOSPITAL_COMMUNITY)
Admission: RE | Admit: 2015-07-02 | Discharge: 2015-07-02 | Disposition: A | Discharge status: Home / Self Care | Payer: Medicare Other | Source: Skilled Nursing Facility | Attending: Internal Medicine | Admitting: Internal Medicine

## 2015-07-02 LAB — PROTIME-INR
INR: 2.19 — ABNORMAL HIGH (ref 0.00–1.49)
Prothrombin Time: 24.1 seconds — ABNORMAL HIGH (ref 11.6–15.2)

## 2015-07-03 LAB — HEMOGLOBIN A1C
HEMOGLOBIN A1C: 7.1 % — AB (ref 4.8–5.6)
Mean Plasma Glucose: 157 mg/dL

## 2015-07-07 ENCOUNTER — Non-Acute Institutional Stay (SKILLED_NURSING_FACILITY): Payer: Medicare Other | Admitting: Internal Medicine

## 2015-07-07 DIAGNOSIS — I5022 Chronic systolic (congestive) heart failure: Secondary | ICD-10-CM | POA: Diagnosis not present

## 2015-07-07 DIAGNOSIS — N182 Chronic kidney disease, stage 2 (mild): Secondary | ICD-10-CM

## 2015-07-07 DIAGNOSIS — E1122 Type 2 diabetes mellitus with diabetic chronic kidney disease: Secondary | ICD-10-CM

## 2015-07-07 DIAGNOSIS — J189 Pneumonia, unspecified organism: Secondary | ICD-10-CM

## 2015-07-07 DIAGNOSIS — I4891 Unspecified atrial fibrillation: Secondary | ICD-10-CM | POA: Diagnosis not present

## 2015-07-07 NOTE — Progress Notes (Signed)
Patient ID: Justin Garner, male   DOB: 30-Mar-1932, 82 y.o.   MRN: [id number]

## 2015-07-08 ENCOUNTER — Encounter (HOSPITAL_COMMUNITY)
Admission: RE | Admit: 2015-07-08 | Discharge: 2015-07-08 | Disposition: A | Discharge status: Home / Self Care | Payer: Medicare Other | Source: Skilled Nursing Facility | Attending: Internal Medicine | Admitting: Internal Medicine

## 2015-07-08 LAB — CBC WITH DIFFERENTIAL/PLATELET
BASOS ABS: 0.1 10*3/uL (ref 0.0–0.1)
Basophils Relative: 1 %
EOS ABS: 0.5 10*3/uL (ref 0.0–0.7)
EOS PCT: 6 %
HCT: 45.2 % (ref 39.0–52.0)
Hemoglobin: 14.8 g/dL (ref 13.0–17.0)
Lymphocytes Relative: 26 %
Lymphs Abs: 2.1 10*3/uL (ref 0.7–4.0)
MCH: 30.1 pg (ref 26.0–34.0)
MCHC: 32.7 g/dL (ref 30.0–36.0)
MCV: 92.1 fL (ref 78.0–100.0)
Monocytes Absolute: 0.7 10*3/uL (ref 0.1–1.0)
Monocytes Relative: 9 %
NEUTROS PCT: 58 %
Neutro Abs: 4.8 10*3/uL (ref 1.7–7.7)
PLATELETS: 311 10*3/uL (ref 150–400)
RBC: 4.91 MIL/uL (ref 4.22–5.81)
RDW: 16.1 % — ABNORMAL HIGH (ref 11.5–15.5)
WBC: 8.2 10*3/uL (ref 4.0–10.5)

## 2015-07-08 LAB — BASIC METABOLIC PANEL
ANION GAP: 7 (ref 5–15)
BUN: 19 mg/dL (ref 6–20)
CHLORIDE: 105 mmol/L (ref 101–111)
CO2: 29 mmol/L (ref 22–32)
Calcium: 8.7 mg/dL — ABNORMAL LOW (ref 8.9–10.3)
Creatinine, Ser: 1.14 mg/dL (ref 0.61–1.24)
GFR calc non Af Amer: 58 mL/min — ABNORMAL LOW (ref >60)
GLUCOSE: 120 mg/dL — AB (ref 65–99)
Potassium: 4.1 mmol/L (ref 3.5–5.1)
Sodium: 141 mmol/L (ref 135–145)

## 2015-07-08 LAB — PROTIME-INR
INR: 1.63 — AB (ref 0.00–1.49)
Prothrombin Time: 19.3 seconds — ABNORMAL HIGH (ref 11.6–15.2)

## 2015-07-09 ENCOUNTER — Encounter (HOSPITAL_COMMUNITY)
Admission: RE | Admit: 2015-07-09 | Discharge: 2015-07-09 | Disposition: A | Discharge status: Home / Self Care | Payer: Medicare Other | Source: Skilled Nursing Facility | Attending: Internal Medicine | Admitting: Internal Medicine

## 2015-07-09 ENCOUNTER — Other Ambulatory Visit: Payer: Self-pay | Admitting: *Deleted

## 2015-07-09 DIAGNOSIS — J449 Chronic obstructive pulmonary disease, unspecified: Secondary | ICD-10-CM | POA: Insufficient documentation

## 2015-07-09 DIAGNOSIS — J90 Pleural effusion, not elsewhere classified: Secondary | ICD-10-CM | POA: Insufficient documentation

## 2015-07-09 DIAGNOSIS — C3410 Malignant neoplasm of upper lobe, unspecified bronchus or lung: Secondary | ICD-10-CM

## 2015-07-09 DIAGNOSIS — K219 Gastro-esophageal reflux disease without esophagitis: Secondary | ICD-10-CM | POA: Insufficient documentation

## 2015-07-09 DIAGNOSIS — J9621 Acute and chronic respiratory failure with hypoxia: Secondary | ICD-10-CM | POA: Insufficient documentation

## 2015-07-09 LAB — PROTIME-INR
INR: 1.85 — AB (ref 0.00–1.49)
Prothrombin Time: 21.2 seconds — ABNORMAL HIGH (ref 11.6–15.2)

## 2015-07-10 NOTE — Progress Notes (Signed)
Patient ID: Justin Garner, male   DOB: 09-09-1932, 82 y.o.   MRN: [id number]       This is a discharge note.  Level of care skilled.  Facility [facility].  Chief complaint-discharge note  HPI: The patient was hospitalized 3/26-06/25/15 with acute on chronic respiratory failure with hypoxia. Presentation was as productive cough; temp to 102.4; and shortness of breath. O2 sats were 89% on room air. This was in context of COPD/emphysema. Initial chest x-ray showed chronic changes and no acute disease. Follow-up film revealed bilateral worsening infiltrates for  which cefepime and vancomycin were prescribed for a possible community-acquired pneumonia.Moderate right effusion present on CT. Thoracentesis 4/3 revealed exudative effusion. Cytology revealed reactive mesothelial cells. Of concern was his slow clinical improvement and persistent leukocytosis. He had a history of R upper lobe cancer in 2009  for which he had lobectomy ;radiation therapy; and chemotherapy. He was followed by an oncologist for 5 years. There is no active oncology follow-up at this time. It was recommended he be re-evaluated if persistent leukocytosis and weight loss present Follow-up the pleural fluid cultures was recommended. At discharge CBC, BMET, INR were recommended every 3 days  Past medical and surgical history: He has history of paroxysmal atrial fibrillation with rapid ventricular response. He is on warfarin for a CHADs 5 . He has diabetes with chronic kidney disease stage II .He had some acute on chronic kidney insufficiency while hospitalized. last A1c on record was 6.4% on 12/29/14. He was on a sliding scale regimen while hospitalized. He has chronic systolic congestive heart failure in the context of cardiomyopathy. Initial ejection fraction was 45%. He was found to be profoundly hypokalemic with a potassium of 2.9  His stay here has been fairly unremarkable he has gained strength he will be going home  with his family who is quite supportive his wife is with him in the room today.  He is on glipizide as well as Januvia for diabetes type 2-blood sugars appear to run somewhat low at times 60-184 in the morning--91 up to mid 100s at noon--later in the day variably from around 90s up to 233. He does not complain of any hypoglycemic episodes-.  Blood pressures appear to be stable most recently 135/77-111/65-104/62.  Respiratory status appears to be improved he has gained strength.  In regards to CHF he's actually lost it appears about 8 pounds since his admission I do not really see evidence of edema he does not complain of increased shortness of breath beyond baseline.      Social history: He quit smoking in 2003. Does not drink  Review of systems.  In general does not complaining any fever or chills .   Eyes: No redness, discharge, pain, vision change ENT/mouth: No nasal congestion,  purulent discharge, earache,change in hearing ,sore throat  Cardiovascular: No chest pain, palpitations,paroxysmal nocturnal dyspnea, claudication  Respiratory: No hemoptysis, significant snoring,apnea   Gastrointestinal: No heartburn,dysphagia,abdominal pain, nausea / vomiting,rectal bleeding, melena,change in bowels Genitourinary: No dysuria,hematuria, pyuria,  incontinence, nocturia Musculoskeletal: No joint stiffness, joint swelling,pain he has gotten stronger generally Dermatologic: No rash, pruritus, change in appearance of skin Neurologic: No dizziness,headache,syncope, seizures, numbness , tingling Psychiatric: No significant anxiety , depression, insomnia, anorexia Endocrine: No change in hair/skin/ nails, excessive thirst, excessive hunger, excessive urination  Hematologic/lymphatic: No significant bruising, lymphadenopathy,abnormal bleeding Allergy/immunology: No itchy/ watery eyes, significant sneezing, urticaria, angioedema  Physical exam:  Pertinent or positive findings: Pattern  alopecia present. He has complete dentures. Facial rosacea  is present. He has a Engineering geologist. He is slightly hard of hearing. Responses are slow but he is oriented 3. Heart rhythm is irregular-irregular. He has shallow air entry at could not appreciate any labored breathing or overt congestion . He has stasis hyperpigmentation particularly of the right lower extremity. Venous insufficiency changes are noted over the lower extremities. He has 1+ edema at the sock line  General appearance:Adequately nourished; no acute distress ,.   Lymphatic: No lymphadenopathy about the head, neck, axilla . Eyes: No conjunctival inflammation or lid edema is present. There is no scleral icterus. Ears:  External ear exam shows no significant lesions or deformities.   Nose:  External nasal examination shows no deformity or inflammation.  Oral exam: lips and gums are healthy appearing.There is no oropharyngeal erythema or exudate .     Heart: without gallop, murmur, click, rub .  Lungs: without wheezes, rhonchi, rubs shallow air entry. Abdomen:Bowel sounds are normal. Abdomen is soft and nontender with no organomegaly, hernias,masses.  Extremities:  No cyanosis  Neurologic exam : Strength equal  in upper & lower extremities but decreased ;  He does ambulate with a walker in fact  Saw him  the hallway earlier today and appeared to be doing fairly well l Skin: Warm & dry w/o tenting. No significant lesions or rash.   Labs.  07/02/2015.  Hemoglobin A1c 7.1.  INR 2.19.  06/29/2015.  WBC 14.6 hemoglobin 12.5 platelets 347  Sodium 135 potassium 3.6 BUN 20 creatinine 1.11.  Assessment and plan.  #1 history of acute on chronic respiratory failure complicated with pneumonia-he appears to have done relatively well here has gained strength he is completing his antibiotics will continue current pulmonary meds including levalbuterol and ipratropium when necessary.  #2 history of community-acquired pneumonia with  chronic emphysematous changes and pulmonary scarring again this appears to be fairly stable he does have a somewhat persistent small right pleural effusion with underlying atelectasis or scarring.  #3 history of atrial fibrillation continues on Coumadin INR will need to be updated-and adjustments made accordingly INR was 2.19 on April 14 again we will need to update this.  He is on Lopressor for rate control this appears to be rate controlled currently.--He also is on Cardizem #4 history of coronary artery disease this is been relatively asymptomatic is on a statin.  #5 history of chronic systolic CHF weight appears to be stable on actually has lost some weight here he is no longer on Lasix this will need  follow up by primary care provider and cardiology as warranted.  #6-history hypothyroidism he does continue on Synthroid.--We will defer follow-up to primary care provider  #7 history diabetes type 2 he is on glipizide and Januvia appears blood sugars at times run a bit low Will DC the glipizide and continue Januvia and monitor this will need follow-up by primary care provider would like to avoid hypoglycemia if at all possible.  History of lung cancer-he has been followed by oncology apparently if there is significant continued weight decline oncology will want to be notified   Of note patient has gained strength clinically has improved-he will need a PT/INR drawn tomorrow home health will also have to monitor this and notify primary care provider of results.  Will obtain a CBC and metabolic panel tomorrow for follow-up as well.  He will need a rolling walker for ambulation as well as continued therapy upon discharge with home health support.  [id number] note greater than 30 minutes spent on  this discharge summary-greater than 50% of time spent coordinating plan of care for numerous diagnoses

## 2015-07-12 ENCOUNTER — Ambulatory Visit (INDEPENDENT_AMBULATORY_CARE_PROVIDER_SITE_OTHER): Payer: Medicare Other | Admitting: *Deleted

## 2015-07-12 ENCOUNTER — Telehealth: Payer: Self-pay | Admitting: Internal Medicine

## 2015-07-12 ENCOUNTER — Ambulatory Visit (HOSPITAL_BASED_OUTPATIENT_CLINIC_OR_DEPARTMENT_OTHER): Payer: Medicare Other | Admitting: Internal Medicine

## 2015-07-12 ENCOUNTER — Encounter: Payer: Self-pay | Admitting: Internal Medicine

## 2015-07-12 ENCOUNTER — Other Ambulatory Visit (HOSPITAL_BASED_OUTPATIENT_CLINIC_OR_DEPARTMENT_OTHER): Payer: Medicare Other

## 2015-07-12 VITALS — BP 134/78 | HR 75 | Temp 97.8°F | Resp 18 | Ht 74.0 in | Wt 186.1 lb

## 2015-07-12 DIAGNOSIS — D72829 Elevated white blood cell count, unspecified: Secondary | ICD-10-CM

## 2015-07-12 DIAGNOSIS — I4891 Unspecified atrial fibrillation: Secondary | ICD-10-CM | POA: Diagnosis not present

## 2015-07-12 DIAGNOSIS — I2699 Other pulmonary embolism without acute cor pulmonale: Secondary | ICD-10-CM | POA: Diagnosis not present

## 2015-07-12 DIAGNOSIS — Z85118 Personal history of other malignant neoplasm of bronchus and lung: Secondary | ICD-10-CM

## 2015-07-12 DIAGNOSIS — C3410 Malignant neoplasm of upper lobe, unspecified bronchus or lung: Secondary | ICD-10-CM

## 2015-07-12 DIAGNOSIS — C3411 Malignant neoplasm of upper lobe, right bronchus or lung: Secondary | ICD-10-CM

## 2015-07-12 HISTORY — DX: Elevated white blood cell count, unspecified: D72.829

## 2015-07-12 LAB — CBC WITH DIFFERENTIAL/PLATELET
BASO%: 1 % (ref 0.0–2.0)
Basophils Absolute: 0.1 10*3/uL (ref 0.0–0.1)
EOS%: 2.1 % (ref 0.0–7.0)
Eosinophils Absolute: 0.2 10*3/uL (ref 0.0–0.5)
HCT: 49.1 % (ref 38.4–49.9)
HEMOGLOBIN: 16.1 g/dL (ref 13.0–17.1)
LYMPH%: 21.1 % (ref 14.0–49.0)
MCH: 29.7 pg (ref 27.2–33.4)
MCHC: 32.8 g/dL (ref 32.0–36.0)
MCV: 90.5 fL (ref 79.3–98.0)
MONO#: 1 10*3/uL — AB (ref 0.1–0.9)
MONO%: 8.8 % (ref 0.0–14.0)
NEUT%: 67 % (ref 39.0–75.0)
NEUTROS ABS: 7.6 10*3/uL — AB (ref 1.5–6.5)
PLATELETS: 351 10*3/uL (ref 140–400)
RBC: 5.42 10*6/uL (ref 4.20–5.82)
RDW: 15.7 % — ABNORMAL HIGH (ref 11.0–14.6)
WBC: 11.3 10*3/uL — AB (ref 4.0–10.3)
lymph#: 2.4 10*3/uL (ref 0.9–3.3)

## 2015-07-12 LAB — COMPREHENSIVE METABOLIC PANEL
ALT: 44 U/L (ref 0–55)
AST: 29 U/L (ref 5–34)
Albumin: 3.3 g/dL — ABNORMAL LOW (ref 3.5–5.0)
Alkaline Phosphatase: 156 U/L — ABNORMAL HIGH (ref 40–150)
Anion Gap: 9 mEq/L (ref 3–11)
BUN: 17.9 mg/dL (ref 7.0–26.0)
CO2: 30 mEq/L — ABNORMAL HIGH (ref 22–29)
Calcium: 9.1 mg/dL (ref 8.4–10.4)
Chloride: 105 mEq/L (ref 98–109)
Creatinine: 1.4 mg/dL — ABNORMAL HIGH (ref 0.7–1.3)
EGFR: 48 mL/min/{1.73_m2} — ABNORMAL LOW (ref >90)
Glucose: 86 mg/dl (ref 70–140)
Potassium: 4 mEq/L (ref 3.5–5.1)
Sodium: 144 mEq/L (ref 136–145)
Total Bilirubin: 0.8 mg/dL (ref 0.20–1.20)
Total Protein: 7.1 g/dL (ref 6.4–8.3)

## 2015-07-12 LAB — POCT INR: INR: 1.9

## 2015-07-12 NOTE — Telephone Encounter (Signed)
Gave and printed appt sched and avs for pt for June °

## 2015-07-12 NOTE — Progress Notes (Signed)
[place] [phone]   [phone]  CONSULT NOTE  REFERRING PHYSICIAN: Dr. Gerald Stabs Rama  REASON FOR CONSULTATION:  82 years old white male with persistent leukocytosis.  HPI Justin Garner is a 82 y.o. male with past medical history significant for multiple medical problems including history of COPD, hypertension, coronary artery disease, diabetes mellitus, congestive heart failure, hypothyroidism as well as atrial fibrillation. The patient was admitted to [facility] on 06/13/2015 was fever, productive cough and shortness of breath. Workup during the admission showed that the patient had pneumonia as well as urinary tract infection. He was treated with cefepime and vancomycin. He was noted to have elevated white blood count during his admission. His leukocytosis persistent during the admission. He was advised after discharge to come to the [facility] for evaluation of his persistent leukocytosis. Has been feeling fine over the last few weeks with no significant complaints. He denied having any significant fever or chills. He has no nausea or vomiting. He denied having any significant shortness breath, cough or hemoptysis. He continues to have right-sided chest pain after his surgical resection in 2009. He has a history of stage II non-small cell lung cancer status post chemoradiation followed by surgery in 2009 under the care of Dr. Arlyce Dice. Family history significant for father died from heart attack and mother died from old age. The patient is married and has 3 children. He was accompanied by his wife Justin Garner. He is to work and a Equities trader. He had a history of smoking for around 50 years but quit in 2003. No history of alcohol or drug abuse.  HPI  Past Medical History  Diagnosis Date  . Diabetes mellitus   . Thyroid disease   . Coronary artery disease   . COPD (chronic obstructive pulmonary disease) ([place])   . GERD (gastroesophageal reflux  disease)   . Pulmonary embolism (HCC)     PMH of  . Hyperlipidemia   . PAF (paroxysmal atrial fibrillation) (HCC)     Chronic warfarin therapy  . Hypertension   . ASCVD (arteriosclerotic cardiovascular disease)   . Cancer [facility]) 2009    Upper lobectomy;radiation; chemotherapy  . Chronic systolic CHF (congestive heart failure) ([place])   . CKD (chronic kidney disease), stage II   . Leukocytosis 07/12/2015    Past Surgical History  Procedure Laterality Date  . Cholecystectomy    . Hernia repair    . Carotid stent    . Inguinal hernia repair  10/10/2010  . Fiberoptic bronchoscopy and meiastinoscopy  06/07/2007  . R vats,thoracotomy and upper lobectomy  08/02/2007  . Left heart catheterization with coronary angiogram N/A 08/19/2013    Procedure: LEFT HEART CATHETERIZATION WITH CORONARY ANGIOGRAM;  Surgeon: Jettie Booze, MD;  [facility];  Service: Cardiovascular;  Laterality: N/A;    Family History  Problem Relation Age of Onset  . Heart disease Father     Heart attack  . Diabetes Brother   . Cancer Neg Hx   . Stroke Neg Hx     Social History Social History  Substance Use Topics  . Smoking status: Former Smoker -- 1.00 packs/day for 50 years    Types: Cigarettes    Quit date: 03/20/2001  . Smokeless tobacco: Former Systems developer  . Alcohol Use: No    No Known Allergies  No current outpatient prescriptions on file.   No current facility-administered medications for this visit.    Review of Systems  Constitutional:  positive for fatigue Eyes: negative Ears, nose, mouth, throat, and face: negative Respiratory: positive for pleurisy/chest pain Cardiovascular: negative Gastrointestinal: negative Genitourinary:negative Integument/breast: negative Hematologic/lymphatic: negative Musculoskeletal:negative Neurological: negative Behavioral/Psych: negative Endocrine: negative Allergic/Immunologic: negative  Physical Exam  LNL:GXQJJ, healthy, no distress, well  nourished and well developed SKIN: skin color, texture, turgor are normal, no rashes or significant lesions HEAD: Normocephalic, No masses, lesions, tenderness or abnormalities EYES: normal, PERRLA, Conjunctiva are pink and non-injected EARS: External ears normal, Canals clear OROPHARYNX:no exudate, no erythema and lips, buccal mucosa, and tongue normal  NECK: supple, no adenopathy, no JVD LYMPH:  no palpable lymphadenopathy, no hepatosplenomegaly LUNGS: clear to auscultation , and palpation HEART: regular rate & rhythm and no murmurs ABDOMEN:abdomen soft, non-tender, normal bowel sounds and no masses or organomegaly BACK: Back symmetric, no curvature., No CVA tenderness EXTREMITIES:no joint deformities, effusion, or inflammation, no edema, no skin discoloration  NEURO: alert & oriented x 3 with fluent speech, no focal motor/sensory deficits  PERFORMANCE STATUS: ECOG 1  LABORATORY DATA: Lab Results  Component Value Date   WBC 11.3* 07/12/2015   HGB 16.1 07/12/2015   HCT 49.1 07/12/2015   MCV 90.5 07/12/2015   PLT 351 07/12/2015      Chemistry      Component Value Date/Time   NA 144 07/12/2015 1348   NA 141 07/08/2015 0745   NA 135* 06/29/2015   K 4.0 07/12/2015 1348   K 4.1 07/08/2015 0745   CL 105 07/08/2015 0745   CO2 30* 07/12/2015 1348   CO2 29 07/08/2015 0745   BUN 17.9 07/12/2015 1348   BUN 19 07/08/2015 0745   BUN 20 06/29/2015   CREATININE 1.4* 07/12/2015 1348   CREATININE 1.14 07/08/2015 0745   CREATININE 1.2 06/29/2015   GLU 64 06/29/2015 0730      Component Value Date/Time   CALCIUM 9.1 07/12/2015 1348   CALCIUM 8.7* 07/08/2015 0745   ALKPHOS 156* 07/12/2015 1348   ALKPHOS 131* 06/18/2015 0742   AST 29 07/12/2015 1348   AST 35 06/18/2015 0742   ALT 44 07/12/2015 1348   ALT 38 06/18/2015 0742   BILITOT 0.80 07/12/2015 1348   BILITOT 1.4* 06/18/2015 0742       RADIOGRAPHIC STUDIES: Dg Chest 1 View  06/21/2015  CLINICAL DATA:  Status post  thoracentesis EXAM: CHEST 1 VIEW COMPARISON:  Chest radiograph June 15, 2015; chest CT June 20, 2015 FINDINGS: There is only small residual pleural effusion on the right following thoracentesis. No pneumothorax. There is fibrotic type change in the lung bases. There is been interval clearing of interstitial edema since recent studies. There is no new opacity. Heart is mildly enlarged with pulmonary vascularity unremarkable. There is bullous disease in the upper lobe regions near the apices. IMPRESSION: No pneumothorax. Minimal residual pleural effusion. Fibrotic change in the bases. Stable cardiomegaly. Bullous disease in the apices. No frank edema or consolidation. Electronically Signed   By: Lowella Grip III M.D.   On: 06/21/2015 15:25   Dg Chest 2 View  06/28/2015  CLINICAL DATA:  Chest congestion, shortness of breath and weakness for several weeks. EXAM: CHEST  2 VIEW COMPARISON:  Chest x-ray 06/21/2015 FINDINGS: The cardiac silhouette, mediastinal and hilar contours are within normal limits and stable. Mild tortuosity of the thoracic aorta. Chronic emphysematous changes and pulmonary scarring with persistent small right effusion and overlying atelectasis or scarring. No new/acute pulmonary findings. IMPRESSION: Chronic emphysematous changes and pulmonary scarring. Persistent Small right pleural effusion with overlying atelectasis or  scarring. Electronically Signed   By: Marijo Sanes M.D.   On: 06/28/2015 20:01   Dg Chest 2 View  06/13/2015  CLINICAL DATA:  Cough and fever for 3 days, initial encounter EXAM: CHEST  2 VIEW COMPARISON:  08/21/2014 FINDINGS: Cardiac shadow is stable. The lungs are well aerated bilaterally and demonstrate some chronic changes similar to that seen on the prior exam. No focal infiltrate or sizable effusion is noted. No acute bony abnormality is seen. IMPRESSION: Chronic changes similar to that noted on the prior exam. No acute infiltrate is seen. Electronically Signed    By: Inez Catalina M.D.   On: 06/13/2015 19:32   Ct Chest W Contrast  06/20/2015  CLINICAL DATA:  Leukocytosis. History of right lung cancer post lumpectomy. EXAM: CT CHEST WITH CONTRAST TECHNIQUE: Multidetector CT imaging of the chest was performed during intravenous contrast administration. CONTRAST:  1 ISOVUE-300 IOPAMIDOL (ISOVUE-300) INJECTION 61% COMPARISON:  06/24/2013 and 05/26/2013 FINDINGS: Evidence of previous right upper lobectomy. Examination demonstrates moderate emphysematous change with bullous emphysematous disease over the left apex and right middle lobe unchanged. Stable small to moderate right pleural effusion. There is associated mixed interstitial airspace opacification over the right lower lobe which may be due to atelectasis or infection and less likely lymphangitic progression of disease. Patchy opacification over the left lower lobe and minimally in the central left midlung. Airways are within normal. Mild stable cardiomegaly. Calcified plaque over the coronary arteries. There is calcified plaque over the thoracic aorta. Few small mediastinal lymph nodes slightly improved. No significant hilar adenopathy. No evidence of axillary adenopathy. Remaining mediastinal structures are within normal. Images through the upper abdomen demonstrate evidence of previous cholecystectomy. Remainder the exam is unchanged. IMPRESSION: Postsurgical change compatible with previous right upper lobectomy for lung cancer. Stable small to moderate right pleural effusion. There is mixed interstitial airspace density over the right lower lobe and to lesser extent the posterior left lobe lower lobe and left midlung which may be due to infection and less likely atelectasis or lymphangitic spread of patient's underlying neoplasm. No significant adenopathy. Moderate emphysematous disease with bullous disease over the right middle lobe and left apex unchanged. Mild cardiomegaly with 3 vessel atherosclerotic coronary  artery disease. Electronically Signed   By: Marin Olp M.D.   On: 06/20/2015 15:51   Dg Chest Port 1 View  06/15/2015  CLINICAL DATA:  Community-acquired pneumonia, history of COPD, lung malignancy EXAM: PORTABLE CHEST 1 VIEW COMPARISON:  PA and lateral chest x-ray of June 13, 2015 FINDINGS: The lungs are adequately inflated. The interstitial markings have become more conspicuous bilaterally. The cardiac silhouette is top-normal in size. The central pulmonary vascularity is prominent. The mediastinum is normal in width. There is a pleural effusion on the right. The left lateral costophrenic angle is excluded from the study. The bony thorax is unremarkable where visualized. IMPRESSION: The study is limited by patient positioning. Worsening of bilateral interstitial infiltrates has occurred. A small right pleural effusion is present and more conspicuous than on the earlier study. Electronically Signed   By: David  Martinique M.D.   On: 06/15/2015 07:23   US Thoracentesis Garner Pleural Space W/img Guide  06/21/2015  INDICATION: Pneumonia. Right-sided pleural effusion. Request diagnostic and therapeutic thoracentesis. EXAM: ULTRASOUND GUIDED RIGHT THORACENTESIS MEDICATIONS: None. COMPLICATIONS: None immediate. PROCEDURE: An ultrasound guided thoracentesis was thoroughly discussed with the patient and questions answered. The benefits, risks, alternatives and complications were also discussed. The patient understands and wishes to proceed with the procedure. Written  consent was obtained. Ultrasound was performed to localize and mark an adequate pocket of fluid in the right chest. The area was then prepped and draped in the normal sterile fashion. 1% Lidocaine was used for local anesthesia. Under ultrasound guidance a 7 cm, 19 gauge, Yueh catheter was introduced. Thoracentesis was performed. The catheter was removed and a dressing applied. FINDINGS: A total of approximately 700 mL of clear, amber colored fluid was  removed. Samples were sent to the laboratory as requested by the clinical team. IMPRESSION: Successful ultrasound guided right thoracentesis yielding 700 mL of pleural fluid. Read by: Ascencion Dike PA-C Electronically Signed   By: Lucrezia Europe M.D.   On: 06/21/2015 15:12    ASSESSMENT: This is a very pleasant 82 years old white male presented for evaluation of persistent leukocytosis. This is most likely reactive in nature for patient was recently treated for pneumonia and urinary tract infection. The patient also has a history of stage II a non-small cell lung cancer status post concurrent chemoradiation followed by surgical resection and has been observation for several years with no evidence for disease recurrence.   PLAN: I had a lengthy discussion with the patient and his wife today about his current condition. Regarding Leukocytosis, this is most likely reactive in nature. His total white blood count is much better. I recommended for the patient to continue on observation. I will repeat CBC and LDH in 2 months for reevaluation of his condition. If he continues to have persistent leukocytosis, I will order molecular study for BCR/ABL. For the history of stage IIA non-small cell lung cancer diagnosed in 2009, the patient will continue on observation and I would consider him for repeat CT scan of the chest if he has any concerning symptoms.  The patient voices understanding of current disease status and treatment options and is in agreement with the current care plan.  All questions were answered. The patient knows to call the clinic with any problems, questions or concerns. We can certainly see the patient much sooner if necessary.  Thank you so much for allowing me to participate in the care of Justin Garner. I will continue to follow up the patient with you and assist in his care.  I spent 40 minutes counseling the patient face to face. The total time spent in the appointment was 60  minutes.  Disclaimer: This note was dictated with voice recognition software. Similar sounding words can inadvertently be transcribed and may not be corrected upon review.   Justin Garner K. July 12, 2015, 2:53 PM

## 2015-07-19 ENCOUNTER — Ambulatory Visit (INDEPENDENT_AMBULATORY_CARE_PROVIDER_SITE_OTHER): Payer: Medicare Other | Admitting: *Deleted

## 2015-07-19 DIAGNOSIS — I4891 Unspecified atrial fibrillation: Secondary | ICD-10-CM | POA: Diagnosis not present

## 2015-07-19 DIAGNOSIS — I2699 Other pulmonary embolism without acute cor pulmonale: Secondary | ICD-10-CM | POA: Diagnosis not present

## 2015-07-19 LAB — POCT INR: INR: 2.7

## 2015-07-28 ENCOUNTER — Encounter: Payer: Self-pay | Admitting: *Deleted

## 2015-08-02 ENCOUNTER — Encounter: Payer: Self-pay | Admitting: Interventional Cardiology

## 2015-08-02 ENCOUNTER — Ambulatory Visit (INDEPENDENT_AMBULATORY_CARE_PROVIDER_SITE_OTHER): Payer: Medicare Other | Admitting: *Deleted

## 2015-08-02 ENCOUNTER — Ambulatory Visit (INDEPENDENT_AMBULATORY_CARE_PROVIDER_SITE_OTHER): Payer: Medicare Other | Admitting: Interventional Cardiology

## 2015-08-02 VITALS — BP 119/70 | HR 80 | Ht 74.0 in | Wt 194.0 lb

## 2015-08-02 DIAGNOSIS — I2699 Other pulmonary embolism without acute cor pulmonale: Secondary | ICD-10-CM

## 2015-08-02 DIAGNOSIS — I4891 Unspecified atrial fibrillation: Secondary | ICD-10-CM

## 2015-08-02 DIAGNOSIS — I10 Essential (primary) hypertension: Secondary | ICD-10-CM

## 2015-08-02 DIAGNOSIS — I2510 Atherosclerotic heart disease of native coronary artery without angina pectoris: Secondary | ICD-10-CM

## 2015-08-02 LAB — POCT INR: INR: 2.7

## 2015-08-02 MED ORDER — METOPROLOL TARTRATE 25 MG PO TABS: 12.5000 mg | ORAL_TABLET | Freq: Two times a day (BID) | ORAL | Status: DC

## 2015-08-02 NOTE — Patient Instructions (Signed)

## 2015-08-02 NOTE — Progress Notes (Signed)
Patient ID: Justin Garner, male   DOB: [birth year], 82 y.o.   MRN: [id number]     Cardiology Office Note   Date:  08/02/2015   ID:  [name], DOB Jan 14, 1933, MRN [id number]  PCP:  Kandice Hams, MD    No chief complaint on file. Follow-up atrial fibrillation   Wt Readings from Last 3 Encounters:  08/02/15 194 lb (87.998 kg)  07/12/15 186 lb 1.6 oz (84.414 kg)  07/07/15 179 lb (81.194 kg)       History of Present Illness: Justin Garner is a 82 y.o. male  who has CAD, AFib.  PTX in Dec 2012, treated with chest tube.  c/o Shortness of breath at patient's baseline. - worse with walking up a hill; but overall better. Denies : Chest pain. Dizziness. Leg edema. Orthopnea. Palpitations. Syncope.  He continues to deny angina, but does have shortness of breath with exertion. Back in 2003 when he had stents placed, he did not have chest pressure at that time.  Cath was done in 2014 due to abnormal echo. no PCI needed in 2015 June.  He was admitted with lung issues in 3-4/17.  He had to follow with oncology as well.  His f/u was ok.  It was not thought to be related to malignancy.      Past Medical History  Diagnosis Date  . Diabetes mellitus   . Thyroid disease   . Coronary artery disease   . COPD (chronic obstructive pulmonary disease) ([place])   . GERD (gastroesophageal reflux disease)   . Pulmonary embolism (HCC)     PMH of  . Hyperlipidemia   . PAF (paroxysmal atrial fibrillation) (HCC)     Chronic warfarin therapy  . Hypertension   . ASCVD (arteriosclerotic cardiovascular disease)   . Cancer [facility]) 2009    Upper lobectomy;radiation; chemotherapy  . Chronic systolic CHF (congestive heart failure) ([place])   . CKD (chronic kidney disease), stage II   . Leukocytosis 07/12/2015  . Primary cancer of right upper lobe of lung ([place]) 06/30/2013    RUL lobectomy 2009 with chest wall resection for superior sulcus tumor Postoperative radiation and chemotherapy; Dr Earlie Server  Oncology monitor [phone]    Past Surgical History  Procedure Laterality Date  . Cholecystectomy    . Hernia repair    . Carotid stent    . Inguinal hernia repair  10/10/2010  . Fiberoptic bronchoscopy and meiastinoscopy  06/07/2007  . R vats,thoracotomy and upper lobectomy  08/02/2007  . Left heart catheterization with coronary angiogram N/A 08/19/2013    Procedure: LEFT HEART CATHETERIZATION WITH CORONARY ANGIOGRAM;  Surgeon: Jettie Booze, MD;  Location: [facility] CATH LAB;  Service: Cardiovascular;  Laterality: N/A;     Current Outpatient Prescriptions  Medication Sig Dispense Refill  . atorvastatin (LIPITOR) 20 MG tablet Take 20 mg by mouth daily.    Marland Kitchen dextromethorphan-guaiFENesin (MUCINEX DM) 30-600 MG 12hr tablet Take 1 tablet by mouth 2 (two) times daily as needed for cough.    . diltiazem (CARDIZEM CD) 240 MG 24 hr capsule Take 1 capsule (240 mg total) by mouth daily. 90 capsule 3  . fish oil-omega-3 fatty acids 1000 MG capsule Take 1 g by mouth 2 (two) times daily.     . furosemide (LASIX) 20 MG tablet Take 20 mg by mouth as directed.    Marland Kitchen JANUVIA 50 MG tablet Take 50 mg by mouth every morning.     Marland Kitchen levothyroxine (SYNTHROID, LEVOTHROID) 112 MCG tablet Take  112 mcg by mouth daily.      . metoprolol tartrate (LOPRESSOR) 25 MG tablet Take 0.5 tablets (12.5 mg total) by mouth 2 (two) times daily.    . nitroGLYCERIN (NITROSTAT) 0.4 MG SL tablet Place 0.4 mg under the tongue every 5 (five) minutes as needed for chest pain (UP TO 3 DOSES BEFORE CALLING EMS). Reported on 07/12/2015    . omeprazole (PRILOSEC) 20 MG capsule Take 20 mg by mouth daily.      Marland Kitchen warfarin (COUMADIN) 5 MG tablet Take as directed by [facility] (Patient taking differently: Take one tablet by mouth once daily) 135 tablet 1   No current facility-administered medications for this visit.    Allergies:   Review of patient's allergies indicates no known allergies.    Social History:  The patient  reports that  he quit smoking about 14 years ago. His smoking use included Cigarettes. He has a 50 pack-year smoking history. He has quit using smokeless tobacco. He reports that he does not drink alcohol or use illicit drugs.   Family History:  The patient's family history includes Diabetes in his brother; Heart attack in his father; Heart disease in his father. There is no history of Cancer, Stroke, or Hypertension.    ROS:  Please see the history of present illness.   Otherwise, review of systems are positive for DOE.   All other systems are reviewed and negative.    PHYSICAL EXAM: VS:  BP 119/70 mmHg  Pulse 80  Ht '6\' 2"'$  (1.88 m)  Wt 194 lb (87.998 kg)  BMI 24.90 kg/m2 , BMI Body mass index is 24.9 kg/(m^2). GEN: Well nourished, well developed, in no acute distress HEENT: normal Neck: no JVD, carotid bruits, or masses Cardiac: irregularly irregular; no murmurs, rubs, or gallops,no edema  Respiratory:  clear to auscultation bilaterally, normal work of breathing GI: soft, nontender, nondistended, + BS MS: no deformity or atrophy Skin: warm and dry, no rash Neuro:  Strength and sensation are intact Psych: euthymic mood, full affect    Recent Labs: 06/19/2015: Magnesium 1.9 06/22/2015: B Natriuretic Peptide 101.1* 07/12/2015: ALT 44; BUN 17.9; Creatinine 1.4*; HGB 16.1; Platelets 351; Potassium 4.0; Sodium 144   Lipid Panel    Component Value Date/Time   CHOL 78 06/15/2015 1147   TRIG 98 06/15/2015 1147   HDL 15* 06/15/2015 1147   CHOLHDL 5.2 06/15/2015 1147   VLDL 20 06/15/2015 1147   LDLCALC 43 06/15/2015 1147     Other studies Reviewed: Additional studies/ records that were reviewed today with results demonstrating: cath in 2015 results reviewed.   ASSESSMENT AND PLAN:  1. Atrial fibrillation: Continue diltiazem. Metoprolol was added in the hospital. He is well rate controlled. Warfarin for stroke prevention. 2. Coronary artery disease: Heart cath in 2015 showed no significant CAD.  He does have prior stents placed in the LAD and circumflex from 2003.  3. Anticoagulant monitoring: Continue warfarin. 4. Hypertension: Blood pressure well controlled. Continue current medicines. 5. Pulmonary: Continue management for COPD as well as prior lung malignancy.   Current medicines are reviewed at length with the patient today.  The patient concerns regarding his medicines were addressed.  The following changes have been made:  No change  Labs/ tests ordered today include:  No orders of the defined types were placed in this encounter.    Recommend 150 minutes/week of aerobic exercise Low fat, low carb, high fiber diet recommended  Disposition:   FU in  1 year  Signed, Larae Grooms, MD  08/02/2015 9:12 AM    [facility] [address], [place], [place]  [address] Phone: [phone]; Fax: [phone]

## 2015-08-06 ENCOUNTER — Other Ambulatory Visit: Payer: Self-pay | Admitting: Interventional Cardiology

## 2015-08-06 MED ORDER — ATORVASTATIN CALCIUM 20 MG PO TABS: 20.0000 mg | ORAL_TABLET | Freq: Every day | ORAL | Status: DC

## 2015-08-09 ENCOUNTER — Other Ambulatory Visit: Payer: Self-pay | Admitting: *Deleted

## 2015-08-09 MED ORDER — DILTIAZEM HCL ER COATED BEADS 240 MG PO CP24: 240.0000 mg | ORAL_CAPSULE | Freq: Every day | ORAL | Status: DC

## 2015-08-09 MED ORDER — METOPROLOL TARTRATE 25 MG PO TABS: 12.5000 mg | ORAL_TABLET | Freq: Two times a day (BID) | ORAL | Status: DC

## 2015-08-25 ENCOUNTER — Ambulatory Visit (INDEPENDENT_AMBULATORY_CARE_PROVIDER_SITE_OTHER): Payer: Medicare Other | Admitting: *Deleted

## 2015-08-25 DIAGNOSIS — I2699 Other pulmonary embolism without acute cor pulmonale: Secondary | ICD-10-CM

## 2015-08-25 DIAGNOSIS — I4891 Unspecified atrial fibrillation: Secondary | ICD-10-CM | POA: Diagnosis not present

## 2015-08-25 LAB — POCT INR: INR: 3.3

## 2015-09-06 ENCOUNTER — Encounter: Payer: Self-pay | Admitting: Internal Medicine

## 2015-09-06 ENCOUNTER — Ambulatory Visit (HOSPITAL_BASED_OUTPATIENT_CLINIC_OR_DEPARTMENT_OTHER): Payer: Medicare Other | Admitting: Internal Medicine

## 2015-09-06 ENCOUNTER — Other Ambulatory Visit (HOSPITAL_BASED_OUTPATIENT_CLINIC_OR_DEPARTMENT_OTHER): Payer: Medicare Other

## 2015-09-06 VITALS — BP 107/77 | HR 84 | Temp 97.0°F | Resp 18 | Ht 74.0 in | Wt 203.8 lb

## 2015-09-06 DIAGNOSIS — C3411 Malignant neoplasm of upper lobe, right bronchus or lung: Secondary | ICD-10-CM

## 2015-09-06 DIAGNOSIS — Z85118 Personal history of other malignant neoplasm of bronchus and lung: Secondary | ICD-10-CM

## 2015-09-06 DIAGNOSIS — D72829 Elevated white blood cell count, unspecified: Secondary | ICD-10-CM

## 2015-09-06 LAB — CBC WITH DIFFERENTIAL/PLATELET
BASO%: 0.5 % (ref 0.0–2.0)
BASOS ABS: 0.1 10*3/uL (ref 0.0–0.1)
EOS%: 2 % (ref 0.0–7.0)
Eosinophils Absolute: 0.2 10*3/uL (ref 0.0–0.5)
HCT: 45 % (ref 38.4–49.9)
HEMOGLOBIN: 15 g/dL (ref 13.0–17.1)
LYMPH%: 23.7 % (ref 14.0–49.0)
MCH: 30.6 pg (ref 27.2–33.4)
MCHC: 33.3 g/dL (ref 32.0–36.0)
MCV: 91.8 fL (ref 79.3–98.0)
MONO#: 0.9 10*3/uL (ref 0.1–0.9)
MONO%: 9.5 % (ref 0.0–14.0)
NEUT#: 6.2 10*3/uL (ref 1.5–6.5)
NEUT%: 64.3 % (ref 39.0–75.0)
Platelets: 201 10*3/uL (ref 140–400)
RBC: 4.9 10*6/uL (ref 4.20–5.82)
RDW: 16.5 % — AB (ref 11.0–14.6)
WBC: 9.7 10*3/uL (ref 4.0–10.3)
lymph#: 2.3 10*3/uL (ref 0.9–3.3)

## 2015-09-06 LAB — LACTATE DEHYDROGENASE: LDH: 200 U/L (ref 125–245)

## 2015-09-06 NOTE — Progress Notes (Signed)
[place] [phone]   [phone]  OFFICE PROGRESS NOTE  Kandice Hams, MD [address] [place] [address]  DIAGNOSIS:  1) reactive leukocytosis, resolved. 2) Stage II a non-small cell lung cancer status post concurrent chemoradiation followed by surgical resection and has been observation for several years with no evidence for disease   PRIOR THERAPY: None  CURRENT THERAPY: Observation.  INTERVAL HISTORY: Justin Garner 82 y.o. male returns to the clinic today for follow-up visit accompanied by his wife. The patient is feeling fine today with no specific complaints. He denied having any significant fever or chills. He has no nausea or vomiting. He denied having any significant chest pain, shortness of breath, cough or hemoptysis. He had repeat CBC performed earlier today and he is here for evaluation and discussion of his lab results.  MEDICAL HISTORY: Past Medical History  Diagnosis Date  . Diabetes mellitus   . Thyroid disease   . Coronary artery disease   . COPD (chronic obstructive pulmonary disease) ([place])   . GERD (gastroesophageal reflux disease)   . Pulmonary embolism (HCC)     PMH of  . Hyperlipidemia   . PAF (paroxysmal atrial fibrillation) (HCC)     Chronic warfarin therapy  . Hypertension   . ASCVD (arteriosclerotic cardiovascular disease)   . Cancer [facility]) 2009    Upper lobectomy;radiation; chemotherapy  . Chronic systolic CHF (congestive heart failure) ([place])   . CKD (chronic kidney disease), stage II   . Leukocytosis 07/12/2015  . Primary cancer of right upper lobe of lung ([place]) 06/30/2013    RUL lobectomy 2009 with chest wall resection for superior sulcus tumor Postoperative radiation and chemotherapy; Dr Earlie Server Oncology monitor [phone]    ALLERGIES:  has No Known Allergies.  MEDICATIONS:  Current Outpatient Prescriptions  Medication Sig Dispense Refill  . atorvastatin (LIPITOR) 20 MG  tablet Take 1 tablet (20 mg total) by mouth daily. 90 tablet 3  . diltiazem (CARDIZEM CD) 240 MG 24 hr capsule Take 1 capsule (240 mg total) by mouth daily. 90 capsule 3  . fish oil-omega-3 fatty acids 1000 MG capsule Take 1 g by mouth 2 (two) times daily.     . furosemide (LASIX) 20 MG tablet Take 20 mg by mouth as directed.    Marland Kitchen glipiZIDE (GLUCOTROL) 10 MG tablet Take 10 mg by mouth daily.  3  . JANUVIA 50 MG tablet Take 50 mg by mouth every morning.     Marland Kitchen levothyroxine (SYNTHROID, LEVOTHROID) 112 MCG tablet Take 112 mcg by mouth daily.      . metoprolol tartrate (LOPRESSOR) 25 MG tablet Take 0.5 tablets (12.5 mg total) by mouth 2 (two) times daily. 90 tablet 3  . omeprazole (PRILOSEC) 20 MG capsule Take 20 mg by mouth daily.      Marland Kitchen warfarin (COUMADIN) 5 MG tablet Take as directed by [facility] (Patient taking differently: Take one tablet by mouth once daily) 135 tablet 1  . FREESTYLE LITE test strip   1  . nitroGLYCERIN (NITROSTAT) 0.4 MG SL tablet Place 0.4 mg under the tongue every 5 (five) minutes as needed for chest pain (UP TO 3 DOSES BEFORE CALLING EMS). Reported on 09/06/2015     No current facility-administered medications for this visit.    SURGICAL HISTORY:  Past Surgical History  Procedure Laterality Date  . Cholecystectomy    . Hernia repair    . Carotid stent    .  Inguinal hernia repair  10/10/2010  . Fiberoptic bronchoscopy and meiastinoscopy  06/07/2007  . R vats,thoracotomy and upper lobectomy  08/02/2007  . Left heart catheterization with coronary angiogram N/A 08/19/2013    Procedure: LEFT HEART CATHETERIZATION WITH CORONARY ANGIOGRAM;  Surgeon: Jettie Booze, MD;  Location: [facility] CATH LAB;  Service: Cardiovascular;  Laterality: N/A;    REVIEW OF SYSTEMS:  A comprehensive review of systems was negative.   PHYSICAL EXAMINATION: General appearance: alert, cooperative and no distress Head: Normocephalic, without obvious abnormality, atraumatic Neck: no  adenopathy, no JVD, supple, symmetrical, trachea midline and thyroid not enlarged, symmetric, no tenderness/mass/nodules Lymph nodes: Cervical, supraclavicular, and axillary nodes normal. Resp: clear to auscultation bilaterally Back: symmetric, no curvature. ROM normal. No CVA tenderness. Cardio: regular rate and rhythm, S1, S2 normal, no murmur, click, rub or gallop GI: soft, non-tender; bowel sounds normal; no masses,  no organomegaly Extremities: extremities normal, atraumatic, no cyanosis or edema  ECOG PERFORMANCE STATUS: 1 - Symptomatic but completely ambulatory  Blood pressure 107/77, pulse 84, temperature 97 F (36.1 C), temperature source Oral, resp. rate 18, height '6\' 2"'$  (1.88 m), weight 203 lb 12.8 oz (92.443 kg), SpO2 99 %.  LABORATORY DATA: Lab Results  Component Value Date   WBC 9.7 09/06/2015   HGB 15.0 09/06/2015   HCT 45.0 09/06/2015   MCV 91.8 09/06/2015   PLT 201 09/06/2015      Chemistry      Component Value Date/Time   NA 144 07/12/2015 1348   NA 141 07/08/2015 0745   NA 135* 06/29/2015   K 4.0 07/12/2015 1348   K 4.1 07/08/2015 0745   CL 105 07/08/2015 0745   CO2 30* 07/12/2015 1348   CO2 29 07/08/2015 0745   BUN 17.9 07/12/2015 1348   BUN 19 07/08/2015 0745   BUN 20 06/29/2015   CREATININE 1.4* 07/12/2015 1348   CREATININE 1.14 07/08/2015 0745   CREATININE 1.2 06/29/2015   GLU 64 06/29/2015 0730      Component Value Date/Time   CALCIUM 9.1 07/12/2015 1348   CALCIUM 8.7* 07/08/2015 0745   ALKPHOS 156* 07/12/2015 1348   ALKPHOS 131* 06/18/2015 0742   AST 29 07/12/2015 1348   AST 35 06/18/2015 0742   ALT 44 07/12/2015 1348   ALT 38 06/18/2015 0742   BILITOT 0.80 07/12/2015 1348   BILITOT 1.4* 06/18/2015 0742       RADIOGRAPHIC STUDIES: No results found.  ASSESSMENT AND PLAN: This is a very pleasant 82 years old white male with history of persistent leukocytosis likely reactive in nature secondary to previous infection including pneumonia  and urinary tract infection. His CBC today showed normal white blood count. The patient also has a history of stage II a non-small cell lung cancer status post concurrent chemoradiation followed by surgical resection and has been observation for several years. I recommended for the patient to continue on observation with routine follow-up visit by his primary care physician. I don't see a need to see the patient at regular basis at this point but will be happy to see him in the future if needed. The patient voices understanding of current disease status and treatment options and is in agreement with the current care plan.  All questions were answered. The patient knows to call the clinic with any problems, questions or concerns. We can certainly see the patient much sooner if necessary.  Disclaimer: This note was dictated with voice recognition software. Similar sounding words can inadvertently be transcribed and  may not be corrected upon review.

## 2015-09-08 ENCOUNTER — Ambulatory Visit (INDEPENDENT_AMBULATORY_CARE_PROVIDER_SITE_OTHER): Payer: Medicare Other | Admitting: *Deleted

## 2015-09-08 DIAGNOSIS — I4891 Unspecified atrial fibrillation: Secondary | ICD-10-CM

## 2015-09-08 DIAGNOSIS — I2699 Other pulmonary embolism without acute cor pulmonale: Secondary | ICD-10-CM

## 2015-09-08 LAB — POCT INR: INR: 2.4

## 2015-09-14 ENCOUNTER — Encounter: Payer: Self-pay | Admitting: Internal Medicine

## 2015-09-29 ENCOUNTER — Ambulatory Visit (INDEPENDENT_AMBULATORY_CARE_PROVIDER_SITE_OTHER): Payer: Medicare Other | Admitting: *Deleted

## 2015-09-29 DIAGNOSIS — I2699 Other pulmonary embolism without acute cor pulmonale: Secondary | ICD-10-CM | POA: Diagnosis not present

## 2015-09-29 DIAGNOSIS — I4891 Unspecified atrial fibrillation: Secondary | ICD-10-CM | POA: Diagnosis not present

## 2015-09-29 LAB — POCT INR: INR: 2.4

## 2015-10-10 IMAGING — CR DG CHEST 2V
2 series · 2 of 2 positions shown · non-contrast
Comparison: 01/14/2014.

CLINICAL DATA: 82-year-old male with a history of hemoptysis.

EXAM:
CHEST - 2 VIEW

[w chest pa]
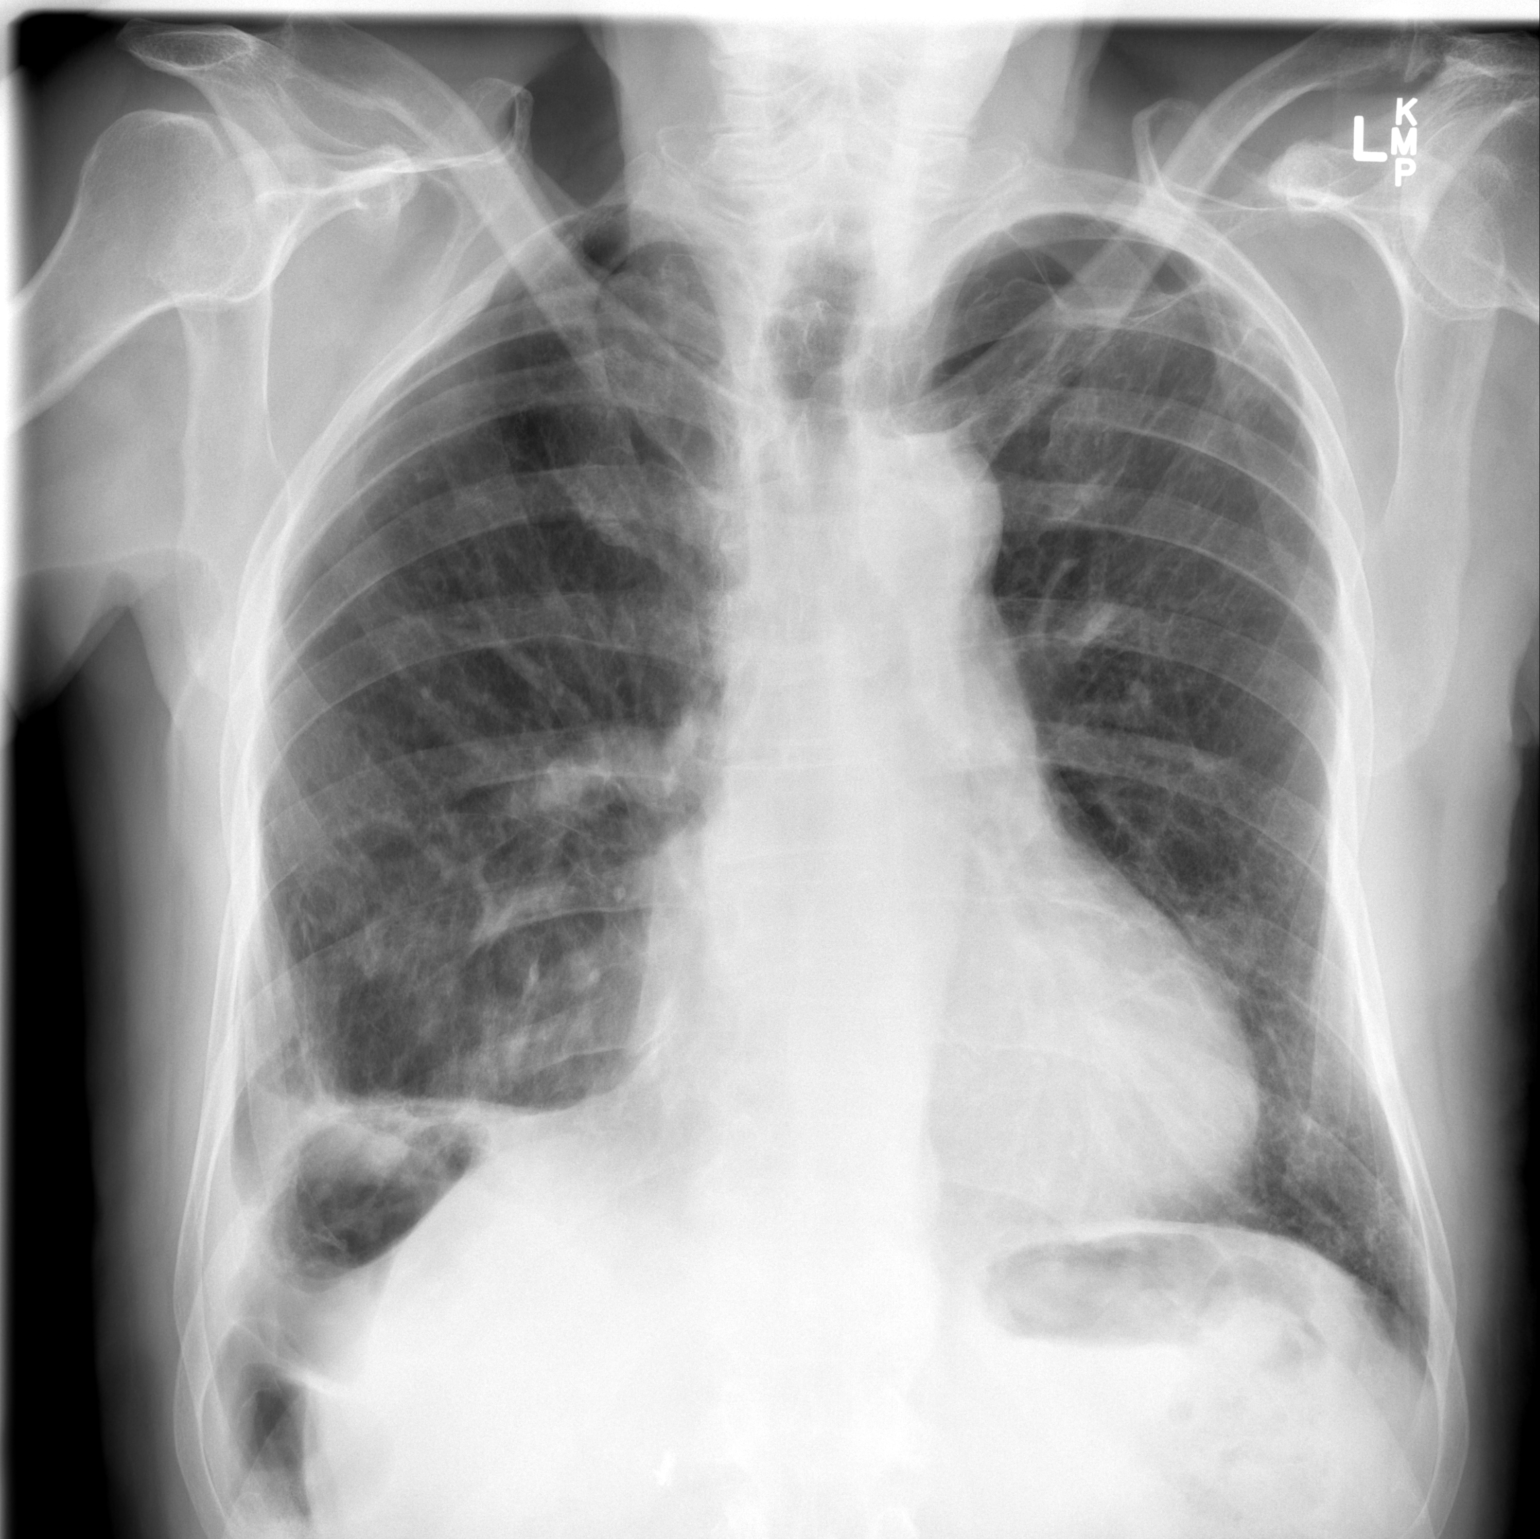

[w chest lat]
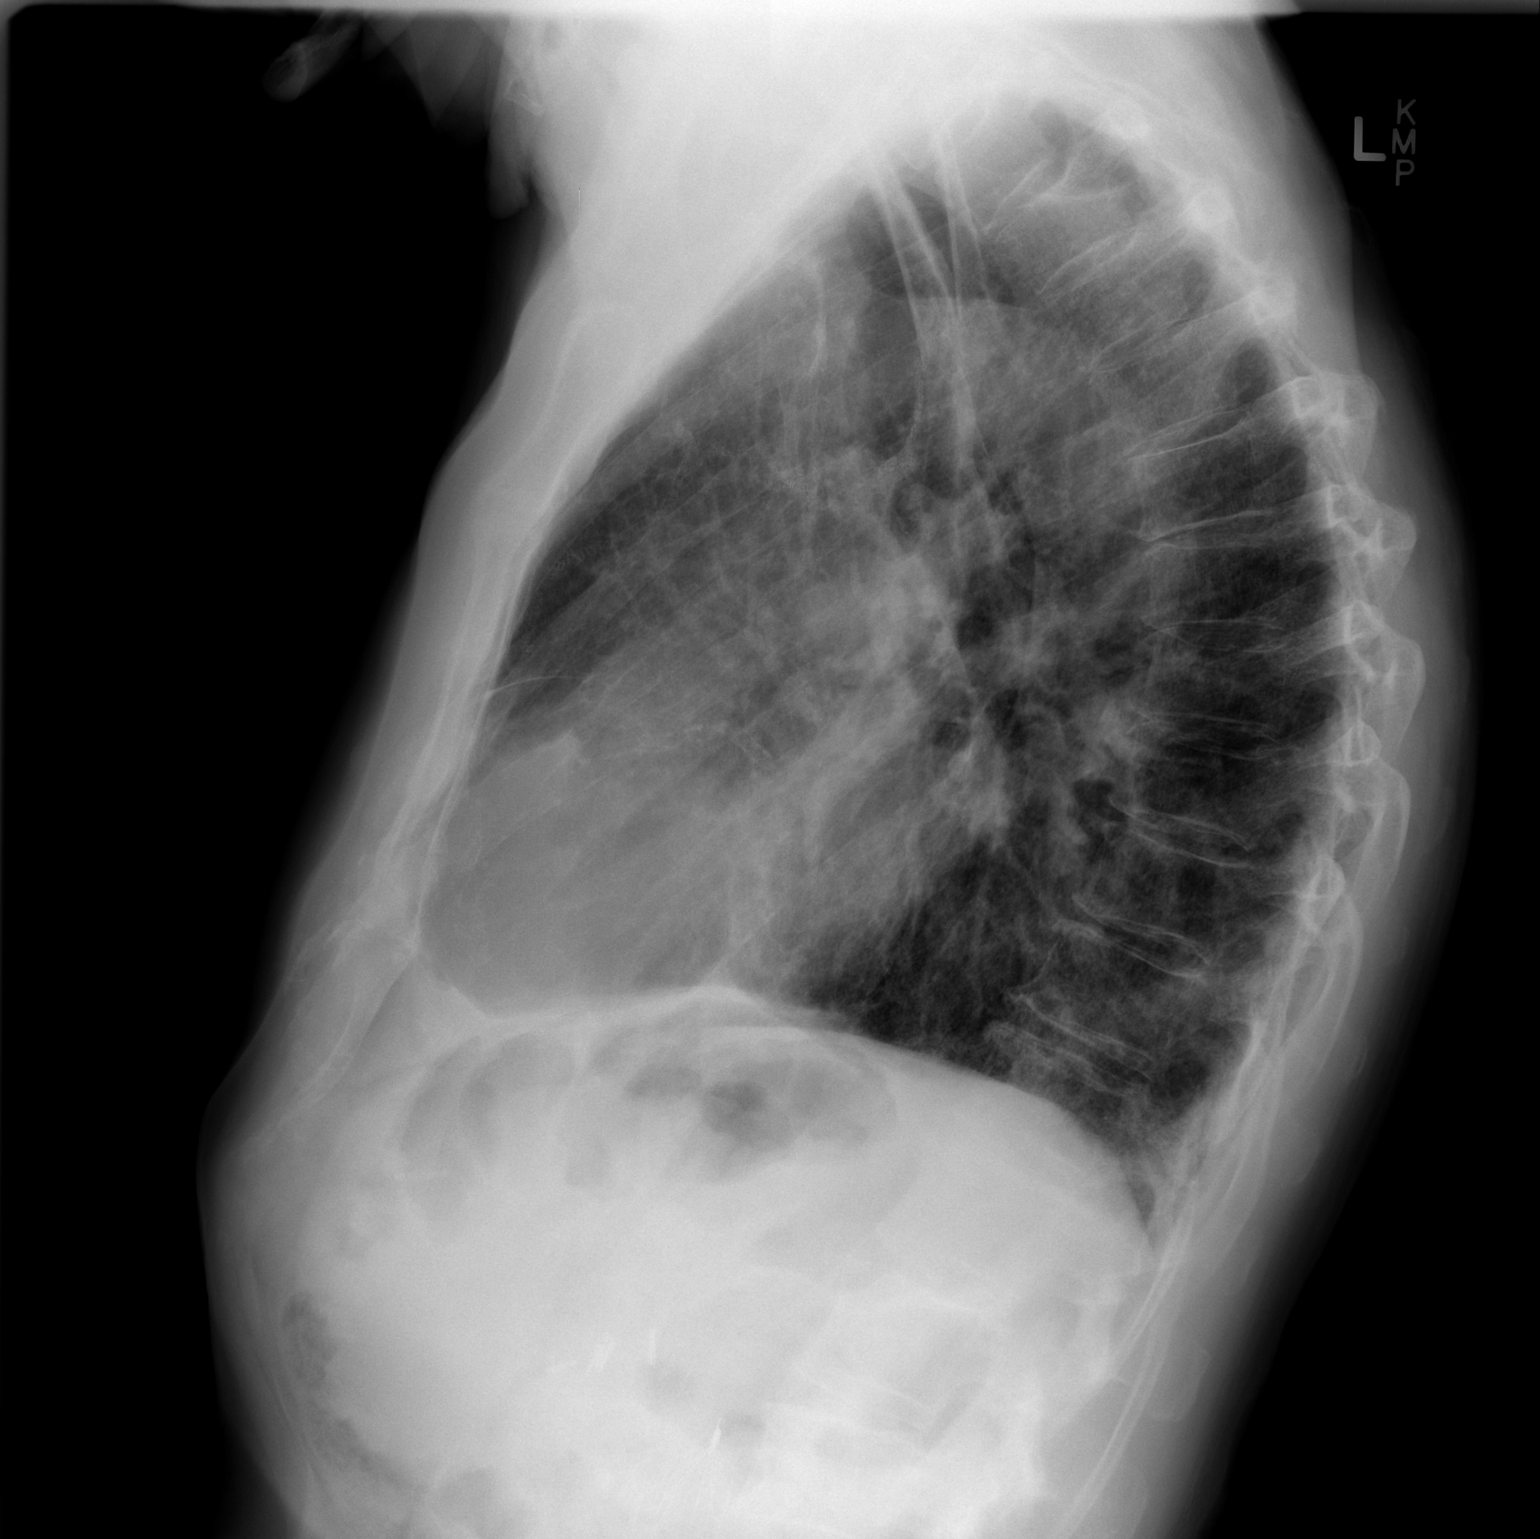

[2 of 2 positions shown; findings below may reference images not displayed]

FINDINGS: Cardiomediastinal silhouette unchanged.

Stigmata of emphysema, with increased retrosternal airspace,
flattened hemidiaphragms, increased AP diameter, and hyperinflation
on the AP view.

Diffusely coarsened interstitial markings with linear and nodular
opacities of the bilateral lungs. Blunting of the right costophrenic
angle persists.

Nodular opacities in the right mid lung and lower lung persist.

No confluent airspace disease.  No pneumothorax or pleural effusion.

Calcifications of the aorta and of coronary arteries.

Accentuated kyphotic curvature with degenerative changes of the
spine.

Surgical changes of the upper abdomen.
IMPRESSION: Extensive chronic changes of the lungs with emphysema and no
definite evidence of superimposed acute cardiopulmonary disease.

Atherosclerosis with evidence of coronary artery disease.

## 2015-10-26 ENCOUNTER — Other Ambulatory Visit: Payer: Self-pay | Admitting: *Deleted

## 2015-10-26 MED ORDER — WARFARIN SODIUM 5 MG PO TABS
ORAL_TABLET | ORAL | 0 refills | Status: DC
Start: 2015-10-26 — End: 2015-11-16

## 2015-10-27 ENCOUNTER — Ambulatory Visit (INDEPENDENT_AMBULATORY_CARE_PROVIDER_SITE_OTHER): Payer: Medicare Other | Admitting: *Deleted

## 2015-10-27 DIAGNOSIS — I4891 Unspecified atrial fibrillation: Secondary | ICD-10-CM

## 2015-10-27 DIAGNOSIS — I2699 Other pulmonary embolism without acute cor pulmonale: Secondary | ICD-10-CM | POA: Diagnosis not present

## 2015-10-27 LAB — POCT INR: INR: 3.1

## 2015-11-14 ENCOUNTER — Observation Stay (HOSPITAL_COMMUNITY)
Admission: EM | Admit: 2015-11-14 | Discharge: 2015-11-16 | Disposition: A | Discharge status: Home / Self Care | Payer: Medicare Other | Attending: Internal Medicine | Admitting: Internal Medicine

## 2015-11-14 ENCOUNTER — Emergency Department (HOSPITAL_COMMUNITY): Payer: Medicare Other

## 2015-11-14 ENCOUNTER — Encounter (HOSPITAL_COMMUNITY): Payer: Self-pay | Admitting: *Deleted

## 2015-11-14 DIAGNOSIS — E039 Hypothyroidism, unspecified: Secondary | ICD-10-CM | POA: Diagnosis present

## 2015-11-14 DIAGNOSIS — E440 Moderate protein-calorie malnutrition: Secondary | ICD-10-CM | POA: Diagnosis not present

## 2015-11-14 DIAGNOSIS — I2782 Chronic pulmonary embolism: Secondary | ICD-10-CM | POA: Diagnosis not present

## 2015-11-14 DIAGNOSIS — Z6825 Body mass index (BMI) 25.0-25.9, adult: Secondary | ICD-10-CM | POA: Diagnosis not present

## 2015-11-14 DIAGNOSIS — E785 Hyperlipidemia, unspecified: Secondary | ICD-10-CM | POA: Diagnosis not present

## 2015-11-14 DIAGNOSIS — Z7901 Long term (current) use of anticoagulants: Secondary | ICD-10-CM | POA: Insufficient documentation

## 2015-11-14 DIAGNOSIS — I482 Chronic atrial fibrillation, unspecified: Secondary | ICD-10-CM | POA: Diagnosis present

## 2015-11-14 DIAGNOSIS — I10 Essential (primary) hypertension: Secondary | ICD-10-CM | POA: Diagnosis present

## 2015-11-14 DIAGNOSIS — E876 Hypokalemia: Secondary | ICD-10-CM | POA: Diagnosis not present

## 2015-11-14 DIAGNOSIS — I5032 Chronic diastolic (congestive) heart failure: Secondary | ICD-10-CM | POA: Diagnosis not present

## 2015-11-14 DIAGNOSIS — E1122 Type 2 diabetes mellitus with diabetic chronic kidney disease: Secondary | ICD-10-CM | POA: Diagnosis not present

## 2015-11-14 DIAGNOSIS — C3411 Malignant neoplasm of upper lobe, right bronchus or lung: Secondary | ICD-10-CM | POA: Insufficient documentation

## 2015-11-14 DIAGNOSIS — E43 Unspecified severe protein-calorie malnutrition: Secondary | ICD-10-CM | POA: Diagnosis not present

## 2015-11-14 DIAGNOSIS — R269 Unspecified abnormalities of gait and mobility: Secondary | ICD-10-CM | POA: Insufficient documentation

## 2015-11-14 DIAGNOSIS — N179 Acute kidney failure, unspecified: Secondary | ICD-10-CM | POA: Diagnosis not present

## 2015-11-14 DIAGNOSIS — K219 Gastro-esophageal reflux disease without esophagitis: Secondary | ICD-10-CM | POA: Insufficient documentation

## 2015-11-14 DIAGNOSIS — Z87891 Personal history of nicotine dependence: Secondary | ICD-10-CM | POA: Insufficient documentation

## 2015-11-14 DIAGNOSIS — I480 Paroxysmal atrial fibrillation: Secondary | ICD-10-CM | POA: Diagnosis not present

## 2015-11-14 DIAGNOSIS — Z794 Long term (current) use of insulin: Secondary | ICD-10-CM | POA: Insufficient documentation

## 2015-11-14 DIAGNOSIS — N182 Chronic kidney disease, stage 2 (mild): Secondary | ICD-10-CM

## 2015-11-14 DIAGNOSIS — N183 Chronic kidney disease, stage 3 (moderate): Secondary | ICD-10-CM | POA: Insufficient documentation

## 2015-11-14 DIAGNOSIS — J441 Chronic obstructive pulmonary disease with (acute) exacerbation: Principal | ICD-10-CM | POA: Insufficient documentation

## 2015-11-14 DIAGNOSIS — R531 Weakness: Secondary | ICD-10-CM | POA: Insufficient documentation

## 2015-11-14 DIAGNOSIS — Z5111 Encounter for antineoplastic chemotherapy: Secondary | ICD-10-CM | POA: Insufficient documentation

## 2015-11-14 DIAGNOSIS — N189 Chronic kidney disease, unspecified: Secondary | ICD-10-CM

## 2015-11-14 DIAGNOSIS — I130 Hypertensive heart and chronic kidney disease with heart failure and stage 1 through stage 4 chronic kidney disease, or unspecified chronic kidney disease: Secondary | ICD-10-CM | POA: Insufficient documentation

## 2015-11-14 DIAGNOSIS — R509 Fever, unspecified: Secondary | ICD-10-CM

## 2015-11-14 LAB — COMPREHENSIVE METABOLIC PANEL
ALBUMIN: 3.7 g/dL (ref 3.5–5.0)
ALT: 19 U/L (ref 17–63)
AST: 27 U/L (ref 15–41)
Alkaline Phosphatase: 163 U/L — ABNORMAL HIGH (ref 38–126)
Anion gap: 11 (ref 5–15)
BUN: 18 mg/dL (ref 6–20)
CHLORIDE: 100 mmol/L — AB (ref 101–111)
CO2: 24 mmol/L (ref 22–32)
Calcium: 8.5 mg/dL — ABNORMAL LOW (ref 8.9–10.3)
Creatinine, Ser: 1.38 mg/dL — ABNORMAL HIGH (ref 0.61–1.24)
GFR calc Af Amer: 54 mL/min — ABNORMAL LOW (ref >60)
GFR calc non Af Amer: 46 mL/min — ABNORMAL LOW (ref >60)
GLUCOSE: 85 mg/dL (ref 65–99)
POTASSIUM: 3.4 mmol/L — AB (ref 3.5–5.1)
SODIUM: 135 mmol/L (ref 135–145)
Total Bilirubin: 2.4 mg/dL — ABNORMAL HIGH (ref 0.3–1.2)
Total Protein: 6.8 g/dL (ref 6.5–8.1)

## 2015-11-14 LAB — CBC WITH DIFFERENTIAL/PLATELET
BASOS PCT: 0 %
Basophils Absolute: 0.1 10*3/uL (ref 0.0–0.1)
EOS ABS: 0.2 10*3/uL (ref 0.0–0.7)
EOS PCT: 1 %
HEMATOCRIT: 47.2 % (ref 39.0–52.0)
Hemoglobin: 15.6 g/dL (ref 13.0–17.0)
Lymphocytes Relative: 19 %
Lymphs Abs: 2.7 10*3/uL (ref 0.7–4.0)
MCH: 30.1 pg (ref 26.0–34.0)
MCHC: 33.1 g/dL (ref 30.0–36.0)
MCV: 91.1 fL (ref 78.0–100.0)
MONO ABS: 1.2 10*3/uL — AB (ref 0.1–1.0)
Monocytes Relative: 8 %
NEUTROS ABS: 10.2 10*3/uL — AB (ref 1.7–7.7)
Neutrophils Relative %: 72 %
PLATELETS: 228 10*3/uL (ref 150–400)
RBC: 5.18 MIL/uL (ref 4.22–5.81)
RDW: 15.3 % (ref 11.5–15.5)
WBC: 14.4 10*3/uL — ABNORMAL HIGH (ref 4.0–10.5)

## 2015-11-14 LAB — URINE MICROSCOPIC-ADD ON

## 2015-11-14 LAB — URINALYSIS, ROUTINE W REFLEX MICROSCOPIC
GLUCOSE, UA: NEGATIVE mg/dL
HGB URINE DIPSTICK: NEGATIVE
Ketones, ur: NEGATIVE mg/dL
LEUKOCYTES UA: NEGATIVE
Nitrite: NEGATIVE
Protein, ur: 100 mg/dL — AB
Specific Gravity, Urine: 1.021 (ref 1.005–1.030)
pH: 6 (ref 5.0–8.0)

## 2015-11-14 MED ORDER — DEXTROSE 5 % IV SOLN
500.0000 mg | Freq: Once | INTRAVENOUS | Status: AC
  Administered 2015-11-15: 500 mg via INTRAVENOUS
  Filled 2015-11-14: qty 500

## 2015-11-14 MED ORDER — IPRATROPIUM-ALBUTEROL 0.5-2.5 (3) MG/3ML IN SOLN
3.0000 mL | Freq: Once | RESPIRATORY_TRACT | Status: AC
  Administered 2015-11-14: 3 mL via RESPIRATORY_TRACT
  Filled 2015-11-14: qty 3

## 2015-11-14 MED ORDER — DEXTROSE 5 % IV SOLN
1.0000 g | Freq: Once | INTRAVENOUS | Status: AC
  Administered 2015-11-15: 1 g via INTRAVENOUS
  Filled 2015-11-14: qty 10

## 2015-11-14 NOTE — ED Triage Notes (Signed)
Patient presents with c/o fever and cough.  States difficulty sleeping due to coughing

## 2015-11-14 NOTE — H&P (Addendum)
History and Physical    RADEN BYINGTON QIH:[id number] DOB: 1933-02-24 DOA: 11/14/2015  Referring MD/NP/PA:   PCP: Kandice Hams, MD   Patient coming from:  The patient is coming from home.  At baseline, pt is independent for most of ADL.   Chief Complaint: fever and cough  HPI: Justin Garner is a 82 y.o. male with medical history significant of hypertension, hyperlipidemia, diabetes mellitus, COPD, GERD, hypothyroidism, PAF and PE on Coumadin, dCHF, CKD-II, lung cancer (s/p of topectomy, radiation and chemotherapy), who presents with fever and cough.  Patient states that she started having fever andcough since last night. He had temperature of 100.6. Per his wife, patient was mildly confused earlier, but mental status is back to normal currently. Patient denies shortness of breath and chest pain. He coughs up white mucus. Patient denies nausea, vomiting, abdominal pain, diarrhea, symptoms of UTI. No unilateral weakness. No rashes.  ED Course: pt was found to have WBC 14.4, negative urinalysis, potassium 3.4, slightly worsening renal function, temperature normal, no tachycardia, slightly tachypnea, oxygen saturation 91-95% on room air, chest x-ray is negative for infiltration, but showed emphysematous changes, chronic fibrosis, right pleural thickening, and postoperative changes. Patient is placed on telemetry bed for observation.  Review of Systems:   General: has fevers, chills, no changes in body weight, has fatigue HEENT: no blurry vision, hearing changes or sore throat Respiratory: has dyspnea, coughing, no wheezing CV: no chest pain, no palpitations GI: no nausea, vomiting, abdominal pain, diarrhea, constipation GU: no dysuria, burning on urination, increased urinary frequency, hematuria  Ext: has leg edema Neuro: no unilateral weakness, numbness, or tingling, no vision change or hearing loss Skin: no rash MSK: No muscle spasm, no deformity, no limitation of range of  movement in spin Heme: No easy bruising.  Travel history: No recent long distant travel.  Allergy: No Known Allergies  Past Medical History:  Diagnosis Date  . ASCVD (arteriosclerotic cardiovascular disease)   . Cancer [facility]) 2009   Upper lobectomy;radiation; chemotherapy  . Chronic systolic CHF (congestive heart failure) ([place])   . CKD (chronic kidney disease), stage II   . COPD (chronic obstructive pulmonary disease) ([place])   . Coronary artery disease   . Diabetes mellitus   . GERD (gastroesophageal reflux disease)   . Hyperlipidemia   . Hypertension   . Leukocytosis 07/12/2015  . PAF (paroxysmal atrial fibrillation) (HCC)    Chronic warfarin therapy  . Primary cancer of right upper lobe of lung ([place]) 06/30/2013   RUL lobectomy 2009 with chest wall resection for superior sulcus tumor Postoperative radiation and chemotherapy; Dr Earlie Server Oncology monitor [phone]  . Pulmonary embolism (HCC)    PMH of  . Thyroid disease     Past Surgical History:  Procedure Laterality Date  . CAROTID STENT    . CHOLECYSTECTOMY    . FIBEROPTIC BRONCHOSCOPY AND MEIASTINOSCOPY  06/07/2007  . HERNIA REPAIR    . INGUINAL HERNIA REPAIR  10/10/2010  . LEFT HEART CATHETERIZATION WITH CORONARY ANGIOGRAM N/A 08/19/2013   Procedure: LEFT HEART CATHETERIZATION WITH CORONARY ANGIOGRAM;  Surgeon: Jettie Booze, MD;  Location: [facility] CATH LAB;  Service: Cardiovascular;  Laterality: N/A;  . R VATS,THORACOTOMY AND UPPER LOBECTOMY  08/02/2007    Social History:  reports that he quit smoking about 14 years ago. His smoking use included Cigarettes. He has a 50.00 pack-year smoking history. He has quit using smokeless tobacco. He reports that he does not drink alcohol or use drugs.  Family History:  Family History  Problem Relation Age of Onset  . Heart disease Father     Heart attack  . Heart attack Father   . Diabetes Brother   . Cancer Neg Hx   . Stroke Neg Hx   . Hypertension Neg Hx      Prior to  Admission medications   Medication Sig Start Date End Date Taking? Authorizing Provider  atorvastatin (LIPITOR) 20 MG tablet Take 1 tablet (20 mg total) by mouth daily. 08/06/15  Yes Jettie Booze, MD  diltiazem (CARDIZEM CD) 240 MG 24 hr capsule Take 1 capsule (240 mg total) by mouth daily. 08/09/15  Yes Jettie Booze, MD  fish oil-omega-3 fatty acids 1000 MG capsule Take 1 g by mouth 2 (two) times daily.    Yes Historical Provider, MD  furosemide (LASIX) 20 MG tablet Take 20 mg by mouth daily.    Yes Historical Provider, MD  glipiZIDE (GLUCOTROL) 10 MG tablet Take 10 mg by mouth daily. 08/09/15  Yes Historical Provider, MD  JANUVIA 50 MG tablet Take 50 mg by mouth every morning.  07/21/11  Yes Historical Provider, MD  levothyroxine (SYNTHROID, LEVOTHROID) 112 MCG tablet Take 112 mcg by mouth daily.     Yes Historical Provider, MD  metoprolol tartrate (LOPRESSOR) 25 MG tablet Take 0.5 tablets (12.5 mg total) by mouth 2 (two) times daily. 08/09/15  Yes Jettie Booze, MD  nitroGLYCERIN (NITROSTAT) 0.4 MG SL tablet Place 0.4 mg under the tongue every 5 (five) minutes as needed for chest pain (UP TO 3 DOSES BEFORE CALLING EMS). Reported on 09/06/2015   Yes Historical Provider, MD  omeprazole (PRILOSEC) 20 MG capsule Take 20 mg by mouth daily.     Yes Historical Provider, MD  warfarin (COUMADIN) 7.5 MG tablet Take 7.5-11.25 mg by mouth See admin instructions. Take 1 tablet on Thursdays then take 1 and 1/2 tablets all the other days   Yes Historical Provider, MD  FREESTYLE LITE test strip  08/09/15   Historical Provider, MD  warfarin (COUMADIN) 5 MG tablet Take as directed by Coumadin Clinic Patient not taking: Reported on 11/14/2015 10/26/15   Jettie Booze, MD    Physical Exam: Vitals:   11/14/15 2104 11/14/15 2105 11/14/15 2317 11/15/15 0009  BP: 135/75  97/72   Pulse: 68  88   Resp: (!) 32  19   Temp: 97.9 F (36.6 C)  98.6 F (37 C) 100.3 F (37.9 C)  TempSrc: Oral  Oral  Rectal  SpO2: 91%  95%   Weight:  90.3 kg (199 lb 1.6 oz)    Height:  '6\' 1"'$  (1.854 m)     General: Not in acute distress HEENT:       Eyes: PERRL, EOMI, no scleral icterus.       ENT: No discharge from the ears and nose, no pharynx injection, no tonsillar enlargement.        Neck: No JVD, no bruit, no mass felt. Heme: No neck lymph node enlargement. Cardiac: S1/S2, RRR, No murmurs, No gallops or rubs. Respiratory: has rhonchi bilaterally. No rales or rubs. GI: Soft, nondistended, nontender, no rebound pain, no organomegaly, BS present. GU: No hematuria Ext: 1+ pitting leg edema bilaterally. 2+DP/PT pulse bilaterally. Musculoskeletal: No joint deformities, No joint redness or warmth, no limitation of ROM in spin. Skin: No rashes.  Neuro: Alert, oriented X3, cranial nerves II-XII grossly intact, moves all extremities normally.  Psych: Patient is not psychotic, no suicidal or hemocidal ideation.  Labs on Admission: I have personally reviewed following labs and imaging studies  CBC:  Recent Labs Lab 11/14/15 2134  WBC 14.4*  NEUTROABS 10.2*  HGB 15.6  HCT 47.2  MCV 91.1  PLT 932   Basic Metabolic Panel:  Recent Labs Lab 11/14/15 2134  NA 135  K 3.4*  CL 100*  CO2 24  GLUCOSE 85  BUN 18  CREATININE 1.38*  CALCIUM 8.5*   GFR: Estimated Creatinine Clearance: 47.4 mL/min (by C-G formula based on SCr of 1.38 mg/dL). Liver Function Tests:  Recent Labs Lab 11/14/15 2134  AST 27  ALT 19  ALKPHOS 163*  BILITOT 2.4*  PROT 6.8  ALBUMIN 3.7   No results for input(s): LIPASE, AMYLASE in the last 168 hours. No results for input(s): AMMONIA in the last 168 hours. Coagulation Profile: No results for input(s): INR, PROTIME in the last 168 hours. Cardiac Enzymes: No results for input(s): CKTOTAL, CKMB, CKMBINDEX, TROPONINI in the last 168 hours. BNP (last 3 results) No results for input(s): PROBNP in the last 8760 hours. HbA1C: No results for input(s): HGBA1C in the  last 72 hours. CBG: No results for input(s): GLUCAP in the last 168 hours. Lipid Profile: No results for input(s): CHOL, HDL, LDLCALC, TRIG, CHOLHDL, LDLDIRECT in the last 72 hours. Thyroid Function Tests: No results for input(s): TSH, T4TOTAL, FREET4, T3FREE, THYROIDAB in the last 72 hours. Anemia Panel: No results for input(s): VITAMINB12, FOLATE, FERRITIN, TIBC, IRON, RETICCTPCT in the last 72 hours. Urine analysis:    Component Value Date/Time   COLORURINE AMBER (A) 11/14/2015 2120   APPEARANCEUR CLEAR 11/14/2015 2120   LABSPEC 1.021 11/14/2015 2120   PHURINE 6.0 11/14/2015 2120   GLUCOSEU NEGATIVE 11/14/2015 2120   HGBUR NEGATIVE 11/14/2015 2120   BILIRUBINUR SMALL (A) 11/14/2015 2120   [place] NEGATIVE 11/14/2015 2120   PROTEINUR 100 (A) 11/14/2015 2120   UROBILINOGEN 4.0 (H) 12/27/2013 2225   NITRITE NEGATIVE 11/14/2015 2120   LEUKOCYTESUR NEGATIVE 11/14/2015 2120   Sepsis Labs: '@LABRCNTIP'$ (procalcitonin:4,lacticidven:4) )No results found for this or any previous visit (from the past 240 hour(s)).   Radiological Exams on Admission: Dg Chest 2 View  Result Date: 11/14/2015 CLINICAL DATA:  Productive cough and fever for 1 day. History of right upper lobe lung cancer in 2015. Previous history of CHF, COPD, coronary disease, hypertension, diabetes, pulmonary embolus. Former smoker. EXAM: CHEST  2 VIEW COMPARISON:  06/28/2015 FINDINGS: Normal heart size and pulmonary vascularity. Diffuse interstitial changes throughout the lungs likely due to fibrosis. Scarring in the left lung apex. Scarring and pleural thickening in the right costophrenic angle. Postoperative changes in the right upper chest with posterior apical rib resections. Emphysematous changes in the lungs. No significant change since previous study. No superimposed consolidation. Calcified and tortuous aorta. IMPRESSION: Emphysematous changes, chronic fibrosis, right pleural thickening, and postoperative changes. No  evidence of active pulmonary disease. Electronically Signed   By: Lucienne Capers M.D.   On: 11/14/2015 22:08     EKG: Not done in ED, will get one.   Assessment/Plan Principal Problem:   COPD exacerbation (HCC) Active Problems:   Chronic atrial fibrillation (HCC)   Essential hypertension, benign   Primary cancer of right upper lobe of lung ([place])   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 (HCC)   Protein-calorie malnutrition, severe (HCC)   Hypothyroidism   HLD (hyperlipidemia)   Chronic diastolic CHF (congestive heart failure) (HCC)   Acute on chronic kidney failure-II   COPD exacerbation ([place]): Patient's cough and  fever are likely caused by COPD exacerbation given by bilateral rhonchi on lung auscultation. No infiltration on chest x-ray.  -will place on telemetry bed for obs -Nebulizers: scheduled Duoneb and prn albuterol -Solu-Medrol 60 mg IV bid  -Patient received 1 dose of Rocephin and azithromycin by IV in ED, will change to oral azithromycin since pt does not have infiltration on chest x-ray. -Mucinex for cough  -Urine S. pneumococcal antigen -Follow up blood culture x2, sputum culture, respiratory virus panel  Atrial Fibrillation: CHA2DS2-VASc Score is 5, needs oral anticoagulation. Patient is on Coumadin at home. INR is pending on admission. Heart rate is well controlled. -continue metoprolol and Cardizem -Continue Coumadin per pharmacy  Hx of PE: -on Coumadin  HTN: Blood pressure 97/72--> 146/92 -Continue home Lasix, metoprolol, Cardizem  Primary cancer of right upper lobe of lung ([place]): pt has stage II a non-small cell lung cancer, s/p of concurrent chemoradiation followed by surgical resection. He is followed up by Dr. Julien Nordmann, last assay was on 09/06/15. He has been observation for several years with no evidence for disease per Dr. Worthy Flank note. -Follow-up with Dr. Julien Nordmann   DM-II: Last A1c 7.1 on 07/02/15, well controled. Patient is taking glucosan and Januvia at  home -SSI  HLD: Last LDL was 43 on 06/15/15 -Continue home medications: Lipitor  Hypothyroidism: Last TSH was 5.913 on 08/06/15 -Continue home Synthroid -Check TSH  Acute on chronic kidney failure-II: Slightly worse renal function. Baseline creatinine 1.1-1.2, her creatinine is 1.38, BUN 18. -Follow-up renal function by BMP  Chronic diastolic CHF (congestive heart failure) [facility]): Patient has 1+ leg edema, but no JVD. No rales on lung auscultation. CHF seems to be compensated. -Continue metoprolol -Continue Lasix 20 mg daily -check BNP  Protein-calorie malnutrition, severe ([place]): -Nutrition consult  Hypokalemia: K= 3.4 on admission. - Repleted   DVT ppx: on Coumadin  Code Status: Full code Family Communication:  Yes, patient's wife at bed side Disposition Plan:  Anticipate discharge back to previous home environment Consults called:  none Admission status: Obs / tele      Date of Service 11/15/2015    Ivor Costa [facility] Pager [phone]  If 7PM-7AM, please contact night-coverage [email or web address] Password [facility] 11/15/2015, 12:45 AM

## 2015-11-14 NOTE — ED Provider Notes (Signed)
[facility] DEPT Provider Note   CSN: [id number] Arrival date & time: 11/14/15  2052     History   Chief Complaint Chief Complaint  Patient presents with  . Fever  . Cough    HPI Justin Garner is a 82 y.o. male.  HPI   Patient with hx DM, COPD, CAD, PE, PAF on coumadin, CHF, RUL  lung cancer (s/p surgery, radiation, chemotherapy) presents with cough and fever that began overnight last night.  Wife notes he mowed the lawn the yesterday without the mask he usually wears and was likely exposed to particulates.  His cough has been dry, fever at home today to 100.6.  Wife was concerned and brought him to the hospital because he was very fatigued and had no energy to do anything today, slept a lot, and started getting confused.  She notes that this is very similar to last spring when he had a pneumonia that resulted in extended hospitalization and SNF stay.  The confusion she describes was pt getting confused about the rules of golf and what the golfers were doing while watching it on television, even though he has played golf and watched it his entire life.  Pt denies chest pain, SOB, orthopnea, change in chronic lower extremity edema.  He has been eating and drinking well.  No change in medications recently.  No known sick contacts.  No recent travel.    Past Medical History:  Diagnosis Date  . ASCVD (arteriosclerotic cardiovascular disease)   . Cancer [facility]) 2009   Upper lobectomy;radiation; chemotherapy  . Chronic systolic CHF (congestive heart failure) ([place])   . CKD (chronic kidney disease), stage II   . COPD (chronic obstructive pulmonary disease) ([place])   . Coronary artery disease   . Diabetes mellitus   . GERD (gastroesophageal reflux disease)   . Hyperlipidemia   . Hypertension   . Leukocytosis 07/12/2015  . PAF (paroxysmal atrial fibrillation) (HCC)    Chronic warfarin therapy  . Primary cancer of right upper lobe of lung ([place]) 06/30/2013   RUL lobectomy 2009 with chest  wall resection for superior sulcus tumor Postoperative radiation and chemotherapy; Dr Earlie Server Oncology monitor [phone]  . Pulmonary embolism (HCC)    PMH of  . Thyroid disease     Patient Active Problem List   Diagnosis Date Noted  . COPD exacerbation ([place]) 11/15/2015  . Acute on chronic kidney failure-II 11/15/2015  . Leukocytosis 07/12/2015  . Chronic obstructive pulmonary disease ([place])   . Chronic diastolic CHF (congestive heart failure) ([place])   . Cardiomyopathy ([facility])   . CAD in native artery   . Hypomagnesemia   . CAP (community acquired pneumonia) 06/13/2015  . Acute on chronic respiratory failure with hypoxia ([place]) 06/13/2015  . Hypothyroidism 06/13/2015  . HLD (hyperlipidemia) 06/13/2015  . Chronic systolic CHF (congestive heart failure) ([place])   . Protein-calorie malnutrition, severe ([place]) 12/29/2013  . Hypokalemia 12/28/2013  . 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 ([place]) 12/28/2013  . Acute systolic heart failure ( [place]) 08/12/2013  . Primary cancer of right upper lobe of lung ([place]) 06/30/2013  . Coronary atherosclerosis of native coronary artery 05/28/2013  . Essential hypertension, benign 05/28/2013  . Chronic atrial fibrillation ([place]) 12/24/2012  . Other pulmonary embolism and infarction 12/24/2012  . COPD (chronic obstructive pulmonary disease) with emphysema ( [place]) 04/23/2008    Past Surgical History:  Procedure Laterality Date  . CAROTID STENT    . CHOLECYSTECTOMY    . FIBEROPTIC BRONCHOSCOPY AND MEIASTINOSCOPY  06/07/2007  . HERNIA REPAIR    . INGUINAL HERNIA REPAIR  10/10/2010  . LEFT HEART CATHETERIZATION WITH CORONARY ANGIOGRAM N/A 08/19/2013   Procedure: LEFT HEART CATHETERIZATION WITH CORONARY ANGIOGRAM;  Surgeon: Jettie Booze, MD;  Location: [facility] CATH LAB;  Service: Cardiovascular;  Laterality: N/A;  . R VATS,THORACOTOMY AND UPPER LOBECTOMY  08/02/2007       Home Medications    Prior to Admission medications   Medication Sig Start Date End Date  Taking? Authorizing Provider  atorvastatin (LIPITOR) 20 MG tablet Take 1 tablet (20 mg total) by mouth daily. 08/06/15  Yes Jettie Booze, MD  diltiazem (CARDIZEM CD) 240 MG 24 hr capsule Take 1 capsule (240 mg total) by mouth daily. 08/09/15  Yes Jettie Booze, MD  fish oil-omega-3 fatty acids 1000 MG capsule Take 1 g by mouth 2 (two) times daily.    Yes Historical Provider, MD  furosemide (LASIX) 20 MG tablet Take 20 mg by mouth daily.    Yes Historical Provider, MD  glipiZIDE (GLUCOTROL) 10 MG tablet Take 10 mg by mouth daily. 08/09/15  Yes Historical Provider, MD  JANUVIA 50 MG tablet Take 50 mg by mouth every morning.  07/21/11  Yes Historical Provider, MD  levothyroxine (SYNTHROID, LEVOTHROID) 112 MCG tablet Take 112 mcg by mouth daily.     Yes Historical Provider, MD  metoprolol tartrate (LOPRESSOR) 25 MG tablet Take 0.5 tablets (12.5 mg total) by mouth 2 (two) times daily. 08/09/15  Yes Jettie Booze, MD  nitroGLYCERIN (NITROSTAT) 0.4 MG SL tablet Place 0.4 mg under the tongue every 5 (five) minutes as needed for chest pain (UP TO 3 DOSES BEFORE CALLING EMS). Reported on 09/06/2015   Yes Historical Provider, MD  omeprazole (PRILOSEC) 20 MG capsule Take 20 mg by mouth daily.     Yes Historical Provider, MD  warfarin (COUMADIN) 7.5 MG tablet Take 7.5-11.25 mg by mouth See admin instructions. Take 1 tablet on Thursdays then take 1 and 1/2 tablets all the other days   Yes Historical Provider, MD  FREESTYLE LITE test strip  08/09/15   Historical Provider, MD  warfarin (COUMADIN) 5 MG tablet Take as directed by [facility] Patient not taking: Reported on 11/14/2015 10/26/15   Jettie Booze, MD    Family History Family History  Problem Relation Age of Onset  . Heart disease Father     Heart attack  . Heart attack Father   . Diabetes Brother   . Cancer Neg Hx   . Stroke Neg Hx   . Hypertension Neg Hx     Social History Social History  Substance Use Topics  . Smoking  status: Former Smoker    Packs/day: 1.00    Years: 50.00    Types: Cigarettes    Quit date: 03/20/2001  . Smokeless tobacco: Former Systems developer  . Alcohol use No     Allergies   Review of patient's allergies indicates no known allergies.   Review of Systems Review of Systems  All other systems reviewed and are negative.    Physical Exam Updated Vital Signs BP 133/96 (BP Location: Right Arm)   Pulse 88   Temp 98.7 F (37.1 C) (Oral)   Resp (!) 33   Ht '6\' 1"'$  (1.854 m)   Wt 88.9 kg   SpO2 91%   BMI 25.85 kg/m   Physical Exam  Constitutional: He appears well-developed and well-nourished. No distress.  HENT:  Head: Normocephalic and atraumatic.  Neck: Neck supple.  Cardiovascular: Normal rate and regular rhythm.   Pulmonary/Chest: Effort normal and breath sounds normal. No respiratory distress. He has no wheezes. He has no rales.  Coughing   Abdominal: Soft. He exhibits no distension and no mass. There is no tenderness. There is no rebound and no guarding.  Musculoskeletal: He exhibits edema (bilateral lower extremity edema, symmetric ).  Neurological: He is alert. He exhibits normal muscle tone.  Skin: He is not diaphoretic.  Nursing note and vitals reviewed.    ED Treatments / Results  Labs (all labs ordered are listed, but only abnormal results are displayed) Labs Reviewed  CBC WITH DIFFERENTIAL/PLATELET - Abnormal; Notable for the following:       Result Value   WBC 14.4 (*)    Neutro Abs 10.2 (*)    Monocytes Absolute 1.2 (*)    All other components within normal limits  COMPREHENSIVE METABOLIC PANEL - Abnormal; Notable for the following:    Potassium 3.4 (*)    Chloride 100 (*)    Creatinine, Ser 1.38 (*)    Calcium 8.5 (*)    Alkaline Phosphatase 163 (*)    Total Bilirubin 2.4 (*)    GFR calc non Af Amer 46 (*)    GFR calc Af Amer 54 (*)    All other components within normal limits  URINALYSIS, ROUTINE W REFLEX MICROSCOPIC (NOT AT [facility]) - Abnormal;  Notable for the following:    Color, Urine AMBER (*)    Bilirubin Urine SMALL (*)    Protein, ur 100 (*)    All other components within normal limits  URINE MICROSCOPIC-ADD ON - Abnormal; Notable for the following:    Squamous Epithelial / LPF 0-5 (*)    Bacteria, UA RARE (*)    All other components within normal limits  RESPIRATORY PANEL BY PCR  CULTURE, BLOOD (ROUTINE X 2)  CULTURE, BLOOD (ROUTINE X 2)  CULTURE, EXPECTORATED SPUTUM-ASSESSMENT  GRAM STAIN  BRAIN NATRIURETIC PEPTIDE  PROTIME-INR  CREATININE, URINE, RANDOM  UREA NITROGEN, URINE  TSH  HIV ANTIBODY (ROUTINE TESTING)  STREP PNEUMONIAE URINARY ANTIGEN  I-STAT CG4 LACTIC ACID, ED    EKG  EKG Interpretation None       Radiology Dg Chest 2 View  Result Date: 11/14/2015 CLINICAL DATA:  Productive cough and fever for 1 day. History of right upper lobe lung cancer in 2015. Previous history of CHF, COPD, coronary disease, hypertension, diabetes, pulmonary embolus. Former smoker. EXAM: CHEST  2 VIEW COMPARISON:  06/28/2015 FINDINGS: Normal heart size and pulmonary vascularity. Diffuse interstitial changes throughout the lungs likely due to fibrosis. Scarring in the left lung apex. Scarring and pleural thickening in the right costophrenic angle. Postoperative changes in the right upper chest with posterior apical rib resections. Emphysematous changes in the lungs. No significant change since previous study. No superimposed consolidation. Calcified and tortuous aorta. IMPRESSION: Emphysematous changes, chronic fibrosis, right pleural thickening, and postoperative changes. No evidence of active pulmonary disease. Electronically Signed   By: Lucienne Capers M.D.   On: 11/14/2015 22:08    Procedures Procedures (including critical care time)  Medications Ordered in ED Medications  albuterol (PROVENTIL) (2.5 MG/3ML) 0.083% nebulizer solution 5 mg (not administered)  ipratropium-albuterol (DUONEB) 0.5-2.5 (3) MG/3ML nebulizer  solution 3 mL (not administered)  diltiazem (CARDIZEM CD) 24 hr capsule 240 mg (not administered)  atorvastatin (LIPITOR) tablet 20 mg (not administered)  furosemide (LASIX) tablet 20 mg (not administered)  nitroGLYCERIN (NITROSTAT) SL tablet 0.4 mg (not administered)  fish oil-omega-3  fatty acids capsule 1 g (not administered)  levothyroxine (SYNTHROID, LEVOTHROID) tablet 112 mcg (not administered)  pantoprazole (PROTONIX) EC tablet 40 mg (not administered)  potassium chloride 20 MEQ/15ML (10%) solution 20 mEq (not administered)  dextromethorphan-guaiFENesin (MUCINEX DM) 30-600 MG per 12 hr tablet 1 tablet (not administered)  azithromycin (ZITHROMAX) tablet 250 mg (not administered)  insulin aspart (novoLOG) injection 0-9 Units (not administered)  insulin aspart (novoLOG) injection 0-5 Units (not administered)  ipratropium-albuterol (DUONEB) 0.5-2.5 (3) MG/3ML nebulizer solution 3 mL (3 mLs Nebulization Given 11/14/15 2339)  cefTRIAXone (ROCEPHIN) 1 g in dextrose 5 % 50 mL IVPB (0 g Intravenous Stopped 11/15/15 0122)  azithromycin (ZITHROMAX) 500 mg in dextrose 5 % 250 mL IVPB (0 mg Intravenous Stopped 11/15/15 0122)     Initial Impression / Assessment and Plan / ED Course  I have reviewed the triage vital signs and the nursing notes.  Pertinent labs & imaging results that were available during my care of the patient were reviewed by me and considered in my medical decision making (see chart for details).  Clinical Course    Pt with COPD, CAD, CHF, paroxysmal Afib on coumadin, lung cancer (s/p surgery ,radiation, chemo) p/w fever and cough that began last night.  Workup demonstrates leukocytosis, O2 sat as low as 91% at rest in ED.  Tachypneic on exam.  Appears fatigued.  Concern given wife's report of confusion as well.  Discussed pt with Dr Tomi Bamberger who also saw the patient.  Pt given nebs, azithromycin, rocephin in ED.  Rectal temp elevated.  Admitted to [facility] for overnight  observation.  Dr Blaine Hamper accepting.    Final Clinical Impressions(s) / ED Diagnoses   Final diagnoses:  COPD exacerbation ([place])  Fever, unspecified fever cause    New Prescriptions New Prescriptions   No medications on file     [place], PA-C 11/15/15 0148

## 2015-11-15 DIAGNOSIS — N189 Chronic kidney disease, unspecified: Secondary | ICD-10-CM

## 2015-11-15 DIAGNOSIS — E039 Hypothyroidism, unspecified: Secondary | ICD-10-CM

## 2015-11-15 DIAGNOSIS — I482 Chronic atrial fibrillation: Secondary | ICD-10-CM | POA: Diagnosis not present

## 2015-11-15 DIAGNOSIS — E43 Unspecified severe protein-calorie malnutrition: Secondary | ICD-10-CM

## 2015-11-15 DIAGNOSIS — C3411 Malignant neoplasm of upper lobe, right bronchus or lung: Secondary | ICD-10-CM

## 2015-11-15 DIAGNOSIS — N179 Acute kidney failure, unspecified: Secondary | ICD-10-CM | POA: Diagnosis not present

## 2015-11-15 DIAGNOSIS — E785 Hyperlipidemia, unspecified: Secondary | ICD-10-CM

## 2015-11-15 DIAGNOSIS — J441 Chronic obstructive pulmonary disease with (acute) exacerbation: Secondary | ICD-10-CM

## 2015-11-15 DIAGNOSIS — N182 Chronic kidney disease, stage 2 (mild): Secondary | ICD-10-CM

## 2015-11-15 DIAGNOSIS — E1122 Type 2 diabetes mellitus with diabetic chronic kidney disease: Secondary | ICD-10-CM

## 2015-11-15 DIAGNOSIS — I5032 Chronic diastolic (congestive) heart failure: Secondary | ICD-10-CM | POA: Diagnosis not present

## 2015-11-15 DIAGNOSIS — E876 Hypokalemia: Secondary | ICD-10-CM

## 2015-11-15 DIAGNOSIS — E440 Moderate protein-calorie malnutrition: Secondary | ICD-10-CM

## 2015-11-15 DIAGNOSIS — I10 Essential (primary) hypertension: Secondary | ICD-10-CM

## 2015-11-15 LAB — GLUCOSE, CAPILLARY
GLUCOSE-CAPILLARY: 104 mg/dL — AB (ref 65–99)
GLUCOSE-CAPILLARY: 156 mg/dL — AB (ref 65–99)
GLUCOSE-CAPILLARY: 306 mg/dL — AB (ref 65–99)
Glucose-Capillary: 276 mg/dL — ABNORMAL HIGH (ref 65–99)
Glucose-Capillary: 258 mg/dL — ABNORMAL HIGH (ref 65–99)

## 2015-11-15 LAB — CREATININE, URINE, RANDOM: Creatinine, Urine: 156.54 mg/dL

## 2015-11-15 LAB — BRAIN NATRIURETIC PEPTIDE: B Natriuretic Peptide: 205.8 pg/mL — ABNORMAL HIGH (ref 0.0–100.0)

## 2015-11-15 LAB — PROTIME-INR
INR: 2.21
Prothrombin Time: 24.9 seconds — ABNORMAL HIGH (ref 11.4–15.2)

## 2015-11-15 LAB — STREP PNEUMONIAE URINARY ANTIGEN: Strep Pneumo Urinary Antigen: NEGATIVE

## 2015-11-15 LAB — TSH: TSH: 1.213 u[IU]/mL (ref 0.350–4.500)

## 2015-11-15 LAB — HIV ANTIBODY (ROUTINE TESTING W REFLEX): HIV Screen 4th Generation wRfx: NONREACTIVE

## 2015-11-15 MED ORDER — INSULIN ASPART 100 UNIT/ML ~~LOC~~ SOLN
0.0000 [IU] | Freq: Three times a day (TID) | SUBCUTANEOUS | Status: DC
Start: 2015-11-15 — End: 2015-11-15
  Administered 2015-11-15: 7 [IU] via SUBCUTANEOUS
  Administered 2015-11-15: 5 [IU] via SUBCUTANEOUS
  Administered 2015-11-15: 2 [IU] via SUBCUTANEOUS

## 2015-11-15 MED ORDER — METHYLPREDNISOLONE SODIUM SUCC 125 MG IJ SOLR
60.0000 mg | Freq: Two times a day (BID) | INTRAMUSCULAR | Status: DC
  Administered 2015-11-15 – 2015-11-16 (×2): 60 mg via INTRAVENOUS
  Filled 2015-11-15 (×2): qty 2

## 2015-11-15 MED ORDER — PRO-STAT SUGAR FREE PO LIQD
30.0000 mL | Freq: Two times a day (BID) | ORAL | Status: DC
  Administered 2015-11-15: 30 mL via ORAL
  Filled 2015-11-15: qty 30

## 2015-11-15 MED ORDER — ATORVASTATIN CALCIUM 20 MG PO TABS
20.0000 mg | ORAL_TABLET | Freq: Every day | ORAL | Status: DC
  Administered 2015-11-15: 20 mg via ORAL
  Filled 2015-11-15: qty 1

## 2015-11-15 MED ORDER — IPRATROPIUM-ALBUTEROL 0.5-2.5 (3) MG/3ML IN SOLN
3.0000 mL | RESPIRATORY_TRACT | Status: DC
  Filled 2015-11-15: qty 3

## 2015-11-15 MED ORDER — LEVOTHYROXINE SODIUM 112 MCG PO TABS
112.0000 ug | ORAL_TABLET | Freq: Every day | ORAL | Status: DC
  Administered 2015-11-15 – 2015-11-16 (×2): 112 ug via ORAL
  Filled 2015-11-15 (×2): qty 1

## 2015-11-15 MED ORDER — INSULIN ASPART 100 UNIT/ML ~~LOC~~ SOLN
0.0000 [IU] | Freq: Three times a day (TID) | SUBCUTANEOUS | Status: DC
Start: 2015-11-16 — End: 2015-11-16
  Administered 2015-11-16 (×2): 5 [IU] via SUBCUTANEOUS

## 2015-11-15 MED ORDER — DILTIAZEM HCL ER COATED BEADS 240 MG PO CP24
240.0000 mg | ORAL_CAPSULE | Freq: Every day | ORAL | Status: DC
  Administered 2015-11-15 – 2015-11-16 (×2): 240 mg via ORAL
  Filled 2015-11-15 (×2): qty 1

## 2015-11-15 MED ORDER — OMEGA-3-ACID ETHYL ESTERS 1 G PO CAPS
1.0000 g | ORAL_CAPSULE | Freq: Two times a day (BID) | ORAL | Status: DC
  Administered 2015-11-15 – 2015-11-16 (×3): 1 g via ORAL
  Filled 2015-11-15 (×3): qty 1

## 2015-11-15 MED ORDER — IPRATROPIUM-ALBUTEROL 0.5-2.5 (3) MG/3ML IN SOLN
3.0000 mL | Freq: Two times a day (BID) | RESPIRATORY_TRACT | Status: DC
  Administered 2015-11-15 – 2015-11-16 (×2): 3 mL via RESPIRATORY_TRACT
  Filled 2015-11-15 (×2): qty 3

## 2015-11-15 MED ORDER — METOPROLOL TARTRATE 25 MG PO TABS
12.5000 mg | ORAL_TABLET | Freq: Two times a day (BID) | ORAL | Status: DC
  Administered 2015-11-15 – 2015-11-16 (×4): 12.5 mg via ORAL
  Filled 2015-11-15 (×4): qty 1

## 2015-11-15 MED ORDER — INSULIN ASPART 100 UNIT/ML ~~LOC~~ SOLN
0.0000 [IU] | Freq: Every day | SUBCUTANEOUS | Status: DC
  Administered 2015-11-15: 3 [IU] via SUBCUTANEOUS

## 2015-11-15 MED ORDER — DM-GUAIFENESIN ER 30-600 MG PO TB12: 1.0000 | ORAL_TABLET | Freq: Two times a day (BID) | ORAL | Status: DC | PRN

## 2015-11-15 MED ORDER — ALBUTEROL SULFATE (2.5 MG/3ML) 0.083% IN NEBU: 5.0000 mg | INHALATION_SOLUTION | RESPIRATORY_TRACT | Status: DC | PRN

## 2015-11-15 MED ORDER — POTASSIUM CHLORIDE 20 MEQ/15ML (10%) PO SOLN
20.0000 meq | Freq: Once | ORAL | Status: AC
Start: 2015-11-15 — End: 2015-11-15
  Administered 2015-11-15: 20 meq via ORAL
  Filled 2015-11-15: qty 15

## 2015-11-15 MED ORDER — FUROSEMIDE 20 MG PO TABS
20.0000 mg | ORAL_TABLET | Freq: Every day | ORAL | Status: DC
  Administered 2015-11-15 – 2015-11-16 (×2): 20 mg via ORAL
  Filled 2015-11-15 (×2): qty 1

## 2015-11-15 MED ORDER — PANTOPRAZOLE SODIUM 40 MG PO TBEC
40.0000 mg | DELAYED_RELEASE_TABLET | Freq: Every day | ORAL | Status: DC
Start: 2015-11-15 — End: 2015-11-16
  Administered 2015-11-15 – 2015-11-16 (×2): 40 mg via ORAL
  Filled 2015-11-15 (×2): qty 1

## 2015-11-15 MED ORDER — METHYLPREDNISOLONE SODIUM SUCC 125 MG IJ SOLR
60.0000 mg | Freq: Once | INTRAMUSCULAR | Status: AC
  Administered 2015-11-15: 60 mg via INTRAVENOUS
  Filled 2015-11-15: qty 2

## 2015-11-15 MED ORDER — OMEGA-3 FATTY ACIDS 1000 MG PO CAPS: 1.0000 g | ORAL_CAPSULE | Freq: Two times a day (BID) | ORAL | Status: DC

## 2015-11-15 MED ORDER — WARFARIN SODIUM 7.5 MG PO TABS
11.2500 mg | ORAL_TABLET | Freq: Once | ORAL | Status: AC
  Administered 2015-11-15: 11.25 mg via ORAL
  Filled 2015-11-15: qty 1.5

## 2015-11-15 MED ORDER — AZITHROMYCIN 250 MG PO TABS
250.0000 mg | ORAL_TABLET | Freq: Every day | ORAL | Status: DC
  Filled 2015-11-15: qty 1

## 2015-11-15 MED ORDER — WARFARIN - PHARMACIST DOSING INPATIENT
Freq: Every day | Status: DC
  Administered 2015-11-15: 18:00:00

## 2015-11-15 MED ORDER — NITROGLYCERIN 0.4 MG SL SUBL: 0.4000 mg | SUBLINGUAL_TABLET | SUBLINGUAL | Status: DC | PRN

## 2015-11-15 MED ORDER — ENSURE ENLIVE PO LIQD
237.0000 mL | ORAL | Status: DC
  Administered 2015-11-15: 237 mL via ORAL

## 2015-11-15 MED ORDER — AZITHROMYCIN 250 MG PO TABS
250.0000 mg | ORAL_TABLET | Freq: Every day | ORAL | Status: DC
  Administered 2015-11-15: 250 mg via ORAL
  Filled 2015-11-15: qty 1

## 2015-11-15 NOTE — Discharge Instructions (Signed)

## 2015-11-15 NOTE — Progress Notes (Signed)
Patient respiratory assessment done and patient scored a 7. Changed from Q4 to BID for today, patient has no home regimen other than Symbicort stated by Wife twice a day. X ray showed no active pulmonary disease at this time. Possible fibrosis and scarring due to this in the lungs. Patient BBS clear throughout with Diminished in the bases. Congested strong cough. Will continue to monitor and assess as needed.

## 2015-11-15 NOTE — Evaluation (Signed)
Physical Therapy Evaluation Patient Details Name: Justin Garner MRN: [id number] DOB: 1932-04-04 Today's Date: 11/15/2015   History of Present Illness  RORIK VESPA is a 82 y.o. male with medical history significant of hypertension, hyperlipidemia, diabetes mellitus, COPD, GERD, hypothyroidism, PAF and PE on Coumadin, dCHF, CKD-II, lung cancer (s/p of topectomy, radiation and chemotherapy), who presents with fever and cough.  Clinical Impression  Pt admitted with above diagnosis. Pt currently with functional limitations due to the deficits listed below (see PT Problem List). Pt ambulated 200' with RW and min-guard A, unsteady initially and would benefit from RW at home. Recently finished a course of HHPT so not recommending at this time but will follow acutely. Encouraged pt to ambulate again today with RW and wife or nsg, relayed to NT.  Pt will benefit from skilled PT to increase their independence and safety with mobility to allow discharge to the venue listed below.       Follow Up Recommendations No PT follow up    Equipment Recommendations  Rolling walker with 5" wheels    Recommendations for Other Services       Precautions / Restrictions Precautions Precautions: Fall Restrictions Weight Bearing Restrictions: No      Mobility  Bed Mobility Overal bed mobility: Modified Independent                Transfers Overall transfer level: Needs assistance Equipment used: None Transfers: Sit to/from Stand Sit to Stand: Supervision         General transfer comment: pt unsteady with initial standing but improved with time up  Ambulation/Gait Ambulation/Gait assistance: Min guard Ambulation Distance (Feet): 200 Feet Assistive device: Rolling walker (2 wheeled) Gait Pattern/deviations: Step-through pattern;Decreased stride length;Trunk flexed Gait velocity: decreased Gait velocity interpretation: Below normal speed for age/gender (between 1.8 and 2.62  ft/sec) General Gait Details: pt with decreased step height and appeared unsteady with initial gait without AD so RW used in hallway. Once back in room, pt able to ambulate bathroom to window without AD with no LOB  Stairs            Wheelchair Mobility    Modified Rankin (Stroke Patients Only)       Balance Overall balance assessment: Needs assistance Sitting-balance support: No upper extremity supported Sitting balance-Leahy Scale: Good     Standing balance support: No upper extremity supported Standing balance-Leahy Scale: Fair Standing balance comment: pt able to maintain static stance, requires UE support for safety when reaching out of BOS                             Pertinent Vitals/Pain Pain Assessment: No/denies pain    Home Living Family/patient expects to be discharged to:: Private residence (pt states home, chart states ALF) Living Arrangements: Spouse/significant other Available Help at Discharge: Family Type of Home: Mobile home Home Access: Stairs to enter Entrance Stairs-Rails: Can reach both;Left;Right Entrance Stairs-Number of Steps: 6 Home Layout: One level Home Equipment: Cane - single point      Prior Function Level of Independence: Needs assistance   Gait / Transfers Assistance Needed: pt ambulates with cane when out of home  ADL's / Homemaking Assistance Needed: wife cooks and cleans as well as drives, pt reports independence with self care        Hand Dominance   Dominant Hand: Right    Extremity/Trunk Assessment   Upper Extremity Assessment: Generalized weakness  Lower Extremity Assessment: Generalized weakness      Cervical / Trunk Assessment: Kyphotic  Communication   Communication: No difficulties  Cognition Arousal/Alertness: Awake/alert Behavior During Therapy: WFL for tasks assessed/performed Overall Cognitive Status: No family/caregiver present to determine baseline cognitive functioning        Memory: Decreased short-term memory              General Comments General comments (skin integrity, edema, etc.): O2 sats 92-93% with ambulation, DOE 2/4, HR up to 118 bpm    Exercises        Assessment/Plan    PT Assessment Patient needs continued PT services  PT Diagnosis Abnormality of gait;Generalized weakness   PT Problem List Decreased strength;Decreased activity tolerance;Decreased balance;Decreased mobility;Decreased knowledge of use of DME;Decreased knowledge of precautions;Cardiopulmonary status limiting activity  PT Treatment Interventions DME instruction;Gait training;Stair training;Functional mobility training;Therapeutic activities;Therapeutic exercise;Balance training;Patient/family education   PT Goals (Current goals can be found in the Care Plan section) Acute Rehab PT Goals Patient Stated Goal: return home PT Goal Formulation: With patient Time For Goal Achievement: 11/29/15 Potential to Achieve Goals: Good    Frequency Min 3X/week   Barriers to discharge        Co-evaluation               End of Session Equipment Utilized During Treatment: Gait belt Activity Tolerance: Patient tolerated treatment well Patient left: in chair;with chair alarm set;with call bell/phone within reach Nurse Communication: Mobility status    Functional Assessment Tool Used: clinical judgement Functional Limitation: Mobility: Walking and moving around Mobility: Walking and Moving Around Current Status 830 100 0100): At least 1 percent but less than 20 percent impaired, limited or restricted Mobility: Walking and Moving Around Goal Status [phone]): 0 percent impaired, limited or restricted    Time: 2671-2458 PT Time Calculation (min) (ACUTE ONLY): 28 min   Charges:   PT Evaluation $PT Eval Moderate Complexity: 1 Procedure PT Treatments $Gait Training: 8-22 mins   PT G Codes:   PT G-Codes **NOT FOR INPATIENT CLASS** Functional Assessment Tool Used: clinical  judgement Functional Limitation: Mobility: Walking and moving around Mobility: Walking and Moving Around Current Status ([id number]): At least 1 percent but less than 20 percent impaired, limited or restricted Mobility: Walking and Moving Around Goal Status [phone]): 0 percent impaired, limited or restricted  Leighton Roach, [place], [place] 11/15/2015, 10:35 AM

## 2015-11-15 NOTE — Progress Notes (Signed)
Initial Nutrition Assessment  DOCUMENTATION CODES:   Non-severe (moderate) malnutrition in context of chronic illness  INTERVENTION:  Provide Ensure Enlive po once daily, each supplement provides 350 kcal and 20 grams of protein Provide 30 ml of Pro-stat BID, each dose provides 15 grams of protein and 100 kcal   NUTRITION DIAGNOSIS:   Inadequate protein intake related to social / environmental circumstances, chronic illness as evidenced by per patient/family report, moderate depletions of muscle mass.   GOAL:   Patient will meet greater than or equal to 90% of their needs   MONITOR:   PO intake, Supplement acceptance, Labs, Weight trends, Skin, I & O's  REASON FOR ASSESSMENT:   Consult Assessment of nutrition requirement/status  ASSESSMENT:   82 y.o. male with medical history significant of hypertension, hyperlipidemia, diabetes mellitus, COPD, GERD, hypothyroidism, PAF and PE on Coumadin, dCHF, CKD-II, lung cancer (s/p of topectomy, radiation and chemotherapy), who presents with fever and cough.  Pt reports that his appetite has been good and he was eating well PTA. Pt reports eating 100% of breakfast this morning and that it wasn't enough food. He reports maintaining his weight at 195 lbs, denies any weight loss. He has moderate muscle wasting and mild fat wasting per nutrition-focused physical exam. States that his clothes have fit a little looser recently. Reviewed patient's diet recall and protein intake. He eats eggs for breakfast, but doesn't necessarily get any protein-rich foods at lunch or dinner. States that he was instructed by his doctor a few years ago to stop eating milk and ice cream. RD reviewed protein rich foods and encouraged intake of protein as well as a general healthful diet. Will allow extra portion of protein at meals and order protein supplements.   Labs: elevated prothrombin time, low calcium  Diet Order:  Diet heart healthy/carb modified Room service  appropriate? Yes; Fluid consistency: Thin  Skin:  Reviewed, no issues  Last BM:  unknown  Height:   Ht Readings from Last 1 Encounters:  11/15/15 '6\' 1"'$  (1.854 m)    Weight:   Wt Readings from Last 1 Encounters:  11/15/15 195 lb 14.4 oz (88.9 kg)    Ideal Body Weight:  83.6 kg  BMI:  Body mass index is 25.85 kg/m.  Estimated Nutritional Needs:   Kcal:  2200-2500  Protein:  120-140 grams  Fluid:  2.2-2.5 L/day  EDUCATION NEEDS:   No education needs identified at this time  Scarlette Ar RD, LDN, CSP Inpatient Clinical Dietitian Pager: [phone] After Hours Pager: [phone]

## 2015-11-15 NOTE — Progress Notes (Signed)
ANTICOAGULATION CONSULT NOTE - Initial Consult  Pharmacy Consult for Coumadin Indication: PE and afib  No Known Allergies  Patient Measurements: Height: '6\' 1"'$  (185.4 cm) Weight: 195 lb 14.4 oz (88.9 kg) IBW/kg (Calculated) : 79.9  Vital Signs: Temp: 98.7 F (37.1 C) (08/28 0139) Temp Source: Oral (08/28 0139) BP: 133/96 (08/28 0139) Pulse Rate: 88 (08/28 0139)  Labs:  Recent Labs  11/14/15 2134  HGB 15.6  HCT 47.2  PLT 228  CREATININE 1.38*    Estimated Creatinine Clearance: 47.4 mL/min (by C-G formula based on SCr of 1.38 mg/dL).   Medical History: Past Medical History:  Diagnosis Date  . ASCVD (arteriosclerotic cardiovascular disease)   . Cancer [facility]) 2009   Upper lobectomy;radiation; chemotherapy  . Chronic systolic CHF (congestive heart failure) ([place])   . CKD (chronic kidney disease), stage II   . COPD (chronic obstructive pulmonary disease) ([place])   . Coronary artery disease   . Diabetes mellitus   . GERD (gastroesophageal reflux disease)   . Hyperlipidemia   . Hypertension   . Leukocytosis 07/12/2015  . PAF (paroxysmal atrial fibrillation) (HCC)    Chronic warfarin therapy  . Primary cancer of right upper lobe of lung ([place]) 06/30/2013   RUL lobectomy 2009 with chest wall resection for superior sulcus tumor Postoperative radiation and chemotherapy; Dr Earlie Server Oncology monitor [phone]  . Pulmonary embolism (HCC)    PMH of  . Thyroid disease     Medications:  Prescriptions Prior to Admission  Medication Sig Dispense Refill Last Dose  . atorvastatin (LIPITOR) 20 MG tablet Take 1 tablet (20 mg total) by mouth daily. 90 tablet 3 11/14/2015 at Unknown time  . diltiazem (CARDIZEM CD) 240 MG 24 hr capsule Take 1 capsule (240 mg total) by mouth daily. 90 capsule 3 11/14/2015 at Unknown time  . fish oil-omega-3 fatty acids 1000 MG capsule Take 1 g by mouth 2 (two) times daily.    11/14/2015 at Unknown time  . furosemide (LASIX) 20 MG tablet Take 20 mg by mouth  daily.    11/14/2015 at Unknown time  . glipiZIDE (GLUCOTROL) 10 MG tablet Take 10 mg by mouth daily.  3 11/14/2015 at Unknown time  . JANUVIA 50 MG tablet Take 50 mg by mouth every morning.    11/14/2015 at Unknown time  . levothyroxine (SYNTHROID, LEVOTHROID) 112 MCG tablet Take 112 mcg by mouth daily.     11/14/2015 at Unknown time  . metoprolol tartrate (LOPRESSOR) 25 MG tablet Take 0.5 tablets (12.5 mg total) by mouth 2 (two) times daily. 90 tablet 3 11/14/2015 at 0800  . nitroGLYCERIN (NITROSTAT) 0.4 MG SL tablet Place 0.4 mg under the tongue every 5 (five) minutes as needed for chest pain (UP TO 3 DOSES BEFORE CALLING EMS). Reported on 09/06/2015   unknown  . omeprazole (PRILOSEC) 20 MG capsule Take 20 mg by mouth daily.     11/14/2015 at Unknown time  . warfarin (COUMADIN) 7.5 MG tablet Take 7.5-11.25 mg by mouth See admin instructions. Take 1 tablet on Thursdays then take 1 and 1/2 tablets all the other days   11/14/2015 at 0800  . FREESTYLE LITE test strip   1 Taking  . warfarin (COUMADIN) 5 MG tablet Take as directed by Coumadin Clinic (Patient not taking: Reported on 11/14/2015) 135 tablet 0 Not Taking at Unknown time    Assessment: 82 y.o.  M presents with fever and cough. Pt on coumadin PTA for h/o PE and afib. CBC stable. Home dose: 7.'5mg'$   on Thursdays then 11.25 mg all other days -last dose 8/27 1800.  Goal of Therapy:  INR 2-3 Monitor platelets by anticoagulation protocol: Yes   Plan:  Daily INR  Sherlon Handing, PharmD, BCPS Clinical pharmacist, pager [phone] 11/15/2015,3:15 AM

## 2015-11-15 NOTE — Evaluation (Signed)
Occupational Therapy Evaluation Patient Details Name: Justin Garner MRN: [id number] DOB: 05/15/1932 Today's Date: 11/15/2015    History of Present Illness BLAND RUDZINSKI is a 82 y.o. male with medical history significant of hypertension, hyperlipidemia, diabetes mellitus, COPD, GERD, hypothyroidism, PAF and PE on Coumadin, dCHF, CKD-II, lung cancer (s/p of topectomy, radiation and chemotherapy), who presents with fever and cough.   Clinical Impression   PTA, pt was independent with basic ADLs and used [facility] for outdoor mobility. Pt currently requires supervision for basic transfers/ambulation and ADLs due to mild unsteady gait and decreased safety awareness. Pt plans to d/c home with 24/7 assistance from his wife. Pt will benefit from continued acute OT to increase independence and safety with ADLs and mobility to allow for safe discharge home. No OT follow up or DME recommended at this time.    Follow Up Recommendations  No OT follow up;Supervision/Assistance - 24 hour    Equipment Recommendations  None recommended by OT    Recommendations for Other Services       Precautions / Restrictions Precautions Precautions: Fall Restrictions Weight Bearing Restrictions: No      Mobility Bed Mobility Overal bed mobility: Modified Independent                Transfers Overall transfer level: Needs assistance Equipment used: None   Sit to Stand: Supervision         General transfer comment: Supervision for safety as pt is mildly unsteady initially upon standing and ambulating.     Balance Overall balance assessment: Needs assistance Sitting-balance support: No upper extremity supported;Feet supported Sitting balance-Leahy Scale: Good     Standing balance support: No upper extremity supported;During functional activity Standing balance-Leahy Scale: Fair Standing balance comment: Pt able to maintain balance without UE support for static standing tasks, but mildly  unsteady with dynamic standing tasks                            ADL Overall ADL's : Needs assistance/impaired Eating/Feeding: Set up;Sitting   Grooming: Wash/dry hands;Wash/dry face;Supervision/safety;Standing   Upper Body Bathing: Supervision/ safety;Sitting   Lower Body Bathing: Supervison/ safety;Sit to/from stand           Toilet Transfer: Supervision/safety;Cueing for safety;Ambulation;Regular Toilet   Toileting- Water quality scientist and Hygiene: Supervision/safety;Sit to/from stand       Functional mobility during ADLs: Supervision/safety       Vision Vision Assessment?: No apparent visual deficits   Perception     Praxis      Pertinent Vitals/Pain Pain Assessment: No/denies pain     Hand Dominance Right   Extremity/Trunk Assessment Upper Extremity Assessment Upper Extremity Assessment: Generalized weakness   Lower Extremity Assessment Lower Extremity Assessment: Generalized weakness   Cervical / Trunk Assessment Cervical / Trunk Assessment: Kyphotic   Communication Communication Communication: No difficulties   Cognition Arousal/Alertness: Awake/alert Behavior During Therapy: WFL for tasks assessed/performed Overall Cognitive Status: No family/caregiver present to determine baseline cognitive functioning       Memory: Decreased short-term memory             General Comments       Exercises       Shoulder Instructions      Home Living Family/patient expects to be discharged to:: Private residence Living Arrangements: Spouse/significant other Available Help at Discharge: Family;Available 24 hours/day Type of Home: Mobile home Home Access: Stairs to enter Entrance Stairs-Number of Steps: 6 Entrance Stairs-Rails: Can  reach both;Left;Right Home Layout: One level     Bathroom Shower/Tub: Occupational psychologist: Standard     Home Equipment: Cane - single point          Prior Functioning/Environment  Level of Independence: Needs assistance  Gait / Transfers Assistance Needed: pt ambulates with cane when out of home ADL's / Homemaking Assistance Needed: wife cooks and cleans as well as drives, pt reports independence with self care        OT Diagnosis: Generalized weakness;Cognitive deficits   OT Problem List: Decreased activity tolerance;Impaired balance (sitting and/or standing);Decreased strength;Decreased safety awareness;Decreased knowledge of use of DME or AE   OT Treatment/Interventions: Therapeutic exercise;Self-care/ADL training;Energy conservation;DME and/or AE instruction;Therapeutic activities;Patient/family education;Balance training    OT Goals(Current goals can be found in the care plan section) Acute Rehab OT Goals Patient Stated Goal: return home OT Goal Formulation: With patient Time For Goal Achievement: 11/29/15 Potential to Achieve Goals: Good ADL Goals Pt Will Perform Upper Body Bathing: with modified independence;sitting Pt Will Perform Lower Body Bathing: with modified independence;sit to/from stand Pt Will Transfer to Toilet: with modified independence;ambulating;regular height toilet Pt Will Perform Toileting - Clothing Manipulation and hygiene: with modified independence;sit to/from stand Pt Will Perform Tub/Shower Transfer: Shower transfer;with supervision;ambulating Additional ADL Goal #1: Pt will verbalize 2 home safety strategies with min verbal cues to minimize fall risk.  OT Frequency: Min 2X/week   Barriers to D/C:            Co-evaluation              End of Session Equipment Utilized During Treatment: Gait belt Nurse Communication: Mobility status  Activity Tolerance: Patient tolerated treatment well Patient left: in bed;with call bell/phone within reach;with bed alarm set   Time: 1325-1339 OT Time Calculation (min): 14 min Charges:  OT General Charges $OT Visit: 1 Procedure OT Evaluation $OT Eval Moderate Complexity: 1  Procedure G-Codes: OT G-codes **NOT FOR INPATIENT CLASS** Functional Assessment Tool Used: clinical judgement Functional Limitation: Self care Self Care Current Status (T1572): At least 1 percent but less than 20 percent impaired, limited or restricted Self Care Goal Status (I2035): 0 percent impaired, limited or restricted  Redmond Baseman, OTR/L Pager: [phone] 11/15/2015, 3:24 PM

## 2015-11-15 NOTE — Progress Notes (Signed)
ANTICOAGULATION CONSULT NOTE - Initial Consult  Pharmacy Consult for Coumadin Indication: PE and afib  No Known Allergies  Patient Measurements: Height: '6\' 1"'$  (185.4 cm) Weight: 195 lb 14.4 oz (88.9 kg) IBW/kg (Calculated) : 79.9  Vital Signs: Temp: 98.1 F (36.7 C) (08/28 0940) Temp Source: Oral (08/28 0940) BP: 128/80 (08/28 0940) Pulse Rate: 91 (08/28 0940)  Labs:  Recent Labs  11/14/15 2134 11/15/15 0335  HGB 15.6  --   HCT 47.2  --   PLT 228  --   LABPROT  --  24.9*  INR  --  2.21  CREATININE 1.38*  --     Estimated Creatinine Clearance: 47.4 mL/min (by C-G formula based on SCr of 1.38 mg/dL).   Medical History: Past Medical History:  Diagnosis Date  . ASCVD (arteriosclerotic cardiovascular disease)   . Cancer [facility]) 2009   Upper lobectomy;radiation; chemotherapy  . Chronic systolic CHF (congestive heart failure) ([place])   . CKD (chronic kidney disease), stage II   . COPD (chronic obstructive pulmonary disease) ([place])   . Coronary artery disease   . Diabetes mellitus   . GERD (gastroesophageal reflux disease)   . Hyperlipidemia   . Hypertension   . Leukocytosis 07/12/2015  . PAF (paroxysmal atrial fibrillation) (HCC)    Chronic warfarin therapy  . Primary cancer of right upper lobe of lung ([place]) 06/30/2013   RUL lobectomy 2009 with chest wall resection for superior sulcus tumor Postoperative radiation and chemotherapy; Dr Earlie Server Oncology monitor [phone]  . Pulmonary embolism (HCC)    PMH of  . Thyroid disease     Medications:  Prescriptions Prior to Admission  Medication Sig Dispense Refill Last Dose  . atorvastatin (LIPITOR) 20 MG tablet Take 1 tablet (20 mg total) by mouth daily. 90 tablet 3 11/14/2015 at Unknown time  . diltiazem (CARDIZEM CD) 240 MG 24 hr capsule Take 1 capsule (240 mg total) by mouth daily. 90 capsule 3 11/14/2015 at Unknown time  . fish oil-omega-3 fatty acids 1000 MG capsule Take 1 g by mouth 2 (two) times daily.    11/14/2015  at Unknown time  . furosemide (LASIX) 20 MG tablet Take 20 mg by mouth daily.    11/14/2015 at Unknown time  . glipiZIDE (GLUCOTROL) 10 MG tablet Take 10 mg by mouth daily.  3 11/14/2015 at Unknown time  . JANUVIA 50 MG tablet Take 50 mg by mouth every morning.    11/14/2015 at Unknown time  . levothyroxine (SYNTHROID, LEVOTHROID) 112 MCG tablet Take 112 mcg by mouth daily.     11/14/2015 at Unknown time  . metoprolol tartrate (LOPRESSOR) 25 MG tablet Take 0.5 tablets (12.5 mg total) by mouth 2 (two) times daily. 90 tablet 3 11/14/2015 at 0800  . nitroGLYCERIN (NITROSTAT) 0.4 MG SL tablet Place 0.4 mg under the tongue every 5 (five) minutes as needed for chest pain (UP TO 3 DOSES BEFORE CALLING EMS). Reported on 09/06/2015   unknown  . omeprazole (PRILOSEC) 20 MG capsule Take 20 mg by mouth daily.     11/14/2015 at Unknown time  . warfarin (COUMADIN) 7.5 MG tablet Take 7.5-11.25 mg by mouth See admin instructions. Take 1 tablet on Thursdays then take 1 and 1/2 tablets all the other days   11/14/2015 at 0800  . FREESTYLE LITE test strip   1 Taking  . warfarin (COUMADIN) 5 MG tablet Take as directed by Coumadin Clinic (Patient not taking: Reported on 11/14/2015) 135 tablet 0 Not Taking at Unknown time  Assessment: 82 y.o.  M presents with fever and cough. Pt on coumadin PTA for h/o PE and afib. CBC stable. Home dose: 7.'5mg'$  on Thursdays then 11.25 mg all other days -last dose 8/27 1800.  Goal of Therapy:  INR 2-3 Monitor platelets by anticoagulation protocol: Yes   Plan:  Warfarin 11.'25mg'$  tonight x1 Daily INR Monitor s/sx of bleeding  Andrey Cota. Diona Foley, PharmD, BCPS Clinical Pharmacist Pager [phone] 11/15/2015,11:00 AM

## 2015-11-15 NOTE — Progress Notes (Signed)
PROGRESS NOTE    Justin Garner  OEU:[id number] DOB: Feb 06, 1933 DOA: 11/14/2015 PCP: Kandice Hams, MD   Chief Complaint  Patient presents with  . Fever  . Cough    Brief Narrative:  HPI on 11/14/2015 by Dr. Ivor Costa Justin Garner is a 82 y.o. male with medical history significant of hypertension, hyperlipidemia, diabetes mellitus, COPD, GERD, hypothyroidism, PAF and PE on Coumadin, dCHF, CKD-II, lung cancer (s/p of topectomy, radiation and chemotherapy), who presents with fever and cough.  Patient states that she started having fever andcough since last night. He had temperature of 100.6. Per his wife, patient was mildly confused earlier, but mental status is back to normal currently. Patient denies shortness of breath and chest pain. He coughs up white mucus. Patient denies nausea, vomiting, abdominal pain, diarrhea, symptoms of UTI. No unilateral weakness. No rashes. Assessment & Plan   COPD exacerbation  -Patient presented with fever as well as cough. -Chest x-ray showed no infiltration, emphysematous changes chronic fibrosis noted -Respiratory viral panel pending -Placed on nebs, Solu-Medrol, azithromycin, Mucinex -Blood and sputum cultures pending -Respiratory viral panel pending -Strep pneumoniae urine antigen negative  Atrial Fibrillation -CHADSVASC 5 -Continue Coumadin per pharmacy, INR 2.21 -Continue metoprolol as well as Cardizem  History of PE -on Coumadin  Essential hypertension  -Continue Lasix, metoprolol, Cardizem  Primary cancer of right upper lobe of lung -Patient has stage II a non-small cell lung cancer, s/p of concurrent chemoradiation followed by surgical resection. He is followed up by Dr. Julien Nordmann, last assay was on 09/06/15. He has been observation for several years with no evidence for disease per Dr. Worthy Flank note. -Follow-up with Dr. Julien Nordmann   Diabetes mellitus, type II  -Last A1c 7.1 on 07/02/15, well controled.  -Continue insulin  sliding scale CBG monitoring -Home medications held  Hyperlipidemia  -Last LDL was 43 on 06/15/15 -Continue statin  Hypothyroidism  -Last TSH was 5.913 on 08/06/15 -Continue Synthroid -TSH 1.213  Acute on chronic kidney failure, stage III -Baseline creatinine 1.1-1.2, currently 1.38, however GFR still in stage III -Continue to monitor BMP  Chronic diastolic CHF (congestive heart failure) -Patient appears to be compensated. He does have trace lower extremity edema -Continue metoprolol, Lasix -BNP 205  Protein-calorie malnutrition, moderate -Nutrition consult -Continue supplements  Hypokalemia -repleted, will continue to monitor BMP  DVT Prophylaxis  Coumadin  Code Status: Full  Family Communication: Wife at bedside  Disposition Plan: Admitted for observation. Possible discharge on 8/29.  Consultants None  Procedures  None  Antibiotics   Anti-infectives    Start     Dose/Rate Route Frequency Ordered Stop   11/15/15 1102  azithromycin (ZITHROMAX) tablet 250 mg     250 mg Oral Daily at 10 pm 11/15/15 1102     11/15/15 1000  azithromycin (ZITHROMAX) tablet 250 mg  Status:  Discontinued     250 mg Oral Daily 11/15/15 0028 11/15/15 1102   11/14/15 2330  cefTRIAXone (ROCEPHIN) 1 g in dextrose 5 % 50 mL IVPB     1 g 100 mL/hr over 30 Minutes Intravenous  Once 11/14/15 2326 11/15/15 0122   11/14/15 2330  azithromycin (ZITHROMAX) 500 mg in dextrose 5 % 250 mL IVPB     500 mg 250 mL/hr over 60 Minutes Intravenous  Once 11/14/15 2326 11/15/15 0122      Subjective:   Justin Garner seen and examined today. Patient feels his breathing has improved.  Continues to have cough, but nonproductive.  Denies Chest pain, abdominal pain,  nausea or vomiting, diarrhea or constipation. Recently saw Dr. Julien Nordmann in June of this year.  Objective:   Vitals:   11/15/15 0139 11/15/15 0639 11/15/15 0940 11/15/15 1139  BP: 133/96 130/83 128/80 127/90  Pulse: 88 93 91 91  Resp:   (!) '21 18 18  '$ Temp: 98.7 F (37.1 C) 100.3 F (37.9 C) 98.1 F (36.7 C) 98.3 F (36.8 C)  TempSrc: Oral Oral Oral Oral  SpO2:  100% 92% 96%  Weight: 88.9 kg (195 lb 14.4 oz)     Height: '6\' 1"'$  (1.854 m)       Intake/Output Summary (Last 24 hours) at 11/15/15 1259 Last data filed at 11/15/15 0854  Gross per 24 hour  Intake              120 ml  Output                0 ml  Net              120 ml   Filed Weights   11/14/15 2105 11/15/15 0139  Weight: 90.3 kg (199 lb 1.6 oz) 88.9 kg (195 lb 14.4 oz)    Exam  General: Well developed, well nourished, NAD, appears stated age  HEENT: NCAT, mucous membranes moist.   Cardiovascular: S1 S2 auscultated, irregular  Respiratory: Diminished but clear, no wheezing or rales noted.  Back: RUL surgical scarring noted  Abdomen: Soft, nontender, nondistended, + bowel sounds  Extremities: warm dry without cyanosis clubbing. Trace LE edema  Neuro: AAOx3, hard of hearing, otherwise nonfocal  Psych: Normal affect and demeanor with intact judgement and insight   Data Reviewed: I have personally reviewed following labs and imaging studies  CBC:  Recent Labs Lab 11/14/15 2134  WBC 14.4*  NEUTROABS 10.2*  HGB 15.6  HCT 47.2  MCV 91.1  PLT 676   Basic Metabolic Panel:  Recent Labs Lab 11/14/15 2134  NA 135  K 3.4*  CL 100*  CO2 24  GLUCOSE 85  BUN 18  CREATININE 1.38*  CALCIUM 8.5*   GFR: Estimated Creatinine Clearance: 47.4 mL/min (by C-G formula based on SCr of 1.38 mg/dL). Liver Function Tests:  Recent Labs Lab 11/14/15 2134  AST 27  ALT 19  ALKPHOS 163*  BILITOT 2.4*  PROT 6.8  ALBUMIN 3.7   No results for input(s): LIPASE, AMYLASE in the last 168 hours. No results for input(s): AMMONIA in the last 168 hours. Coagulation Profile:  Recent Labs Lab 11/15/15 0335  INR 2.21   Cardiac Enzymes: No results for input(s): CKTOTAL, CKMB, CKMBINDEX, TROPONINI in the last 168 hours. BNP (last 3 results) No  results for input(s): PROBNP in the last 8760 hours. HbA1C: No results for input(s): HGBA1C in the last 72 hours. CBG:  Recent Labs Lab 11/15/15 0352 11/15/15 0644 11/15/15 1136  GLUCAP 104* 156* 306*   Lipid Profile: No results for input(s): CHOL, HDL, LDLCALC, TRIG, CHOLHDL, LDLDIRECT in the last 72 hours. Thyroid Function Tests:  Recent Labs  11/15/15 0336  TSH 1.213   Anemia Panel: No results for input(s): VITAMINB12, FOLATE, FERRITIN, TIBC, IRON, RETICCTPCT in the last 72 hours. Urine analysis:    Component Value Date/Time   COLORURINE AMBER (A) 11/14/2015 2120   APPEARANCEUR CLEAR 11/14/2015 2120   LABSPEC 1.021 11/14/2015 2120   PHURINE 6.0 11/14/2015 2120   GLUCOSEU NEGATIVE 11/14/2015 2120   HGBUR NEGATIVE 11/14/2015 2120   BILIRUBINUR SMALL (A) 11/14/2015 2120   [place] NEGATIVE 11/14/2015 2120  PROTEINUR 100 (A) 11/14/2015 2120   UROBILINOGEN 4.0 (H) 12/27/2013 2225   NITRITE NEGATIVE 11/14/2015 2120   LEUKOCYTESUR NEGATIVE 11/14/2015 2120   Sepsis Labs: '@LABRCNTIP'$ (procalcitonin:4,lacticidven:4)  )No results found for this or any previous visit (from the past 240 hour(s)).    Radiology Studies: Dg Chest 2 View  Result Date: 11/14/2015 CLINICAL DATA:  Productive cough and fever for 1 day. History of right upper lobe lung cancer in 2015. Previous history of CHF, COPD, coronary disease, hypertension, diabetes, pulmonary embolus. Former smoker. EXAM: CHEST  2 VIEW COMPARISON:  06/28/2015 FINDINGS: Normal heart size and pulmonary vascularity. Diffuse interstitial changes throughout the lungs likely due to fibrosis. Scarring in the left lung apex. Scarring and pleural thickening in the right costophrenic angle. Postoperative changes in the right upper chest with posterior apical rib resections. Emphysematous changes in the lungs. No significant change since previous study. No superimposed consolidation. Calcified and tortuous aorta. IMPRESSION: Emphysematous  changes, chronic fibrosis, right pleural thickening, and postoperative changes. No evidence of active pulmonary disease. Electronically Signed   By: Lucienne Capers M.D.   On: 11/14/2015 22:08     Scheduled Meds: . atorvastatin  20 mg Oral q1800  . azithromycin  250 mg Oral Q2200  . diltiazem  240 mg Oral Daily  . feeding supplement (ENSURE ENLIVE)  237 mL Oral Q24H  . feeding supplement (PRO-STAT SUGAR FREE 64)  30 mL Oral BID WC  . furosemide  20 mg Oral Daily  . insulin aspart  0-5 Units Subcutaneous QHS  . insulin aspart  0-9 Units Subcutaneous TID WC  . ipratropium-albuterol  3 mL Nebulization BID  . levothyroxine  112 mcg Oral QAC breakfast  . methylPREDNISolone (SOLU-MEDROL) injection  60 mg Intravenous Q12H  . metoprolol tartrate  12.5 mg Oral BID  . omega-3 acid ethyl esters  1 g Oral BID  . pantoprazole  40 mg Oral Daily  . warfarin  11.25 mg Oral ONCE-1800  . Warfarin - Pharmacist Dosing Inpatient   Does not apply q1800   Continuous Infusions:    LOS: 0 days   Time Spent in minutes   30 minutes  Tagen Milby D.O. on 11/15/2015 at 12:59 PM  Between 7am to 7pm - Pager - [phone]  After 7pm go to [email or web address] - password [email or web address]  And look for the night coverage person covering for me after hours  [facility] Office  [phone]

## 2015-11-16 ENCOUNTER — Telehealth: Payer: Self-pay | Admitting: Interventional Cardiology

## 2015-11-16 DIAGNOSIS — J441 Chronic obstructive pulmonary disease with (acute) exacerbation: Secondary | ICD-10-CM | POA: Diagnosis not present

## 2015-11-16 DIAGNOSIS — I5032 Chronic diastolic (congestive) heart failure: Secondary | ICD-10-CM | POA: Diagnosis not present

## 2015-11-16 DIAGNOSIS — I482 Chronic atrial fibrillation: Secondary | ICD-10-CM | POA: Diagnosis not present

## 2015-11-16 DIAGNOSIS — N179 Acute kidney failure, unspecified: Secondary | ICD-10-CM | POA: Diagnosis not present

## 2015-11-16 LAB — BASIC METABOLIC PANEL
ANION GAP: 9 (ref 5–15)
BUN: 25 mg/dL — ABNORMAL HIGH (ref 6–20)
CALCIUM: 9.2 mg/dL (ref 8.9–10.3)
CO2: 25 mmol/L (ref 22–32)
CREATININE: 1.14 mg/dL (ref 0.61–1.24)
Chloride: 104 mmol/L (ref 101–111)
GFR calc Af Amer: >60 mL/min (ref >60)
GFR, EST NON AFRICAN AMERICAN: 58 mL/min — AB (ref >60)
Glucose, Bld: 203 mg/dL — ABNORMAL HIGH (ref 65–99)
Potassium: 3.5 mmol/L (ref 3.5–5.1)
SODIUM: 138 mmol/L (ref 135–145)

## 2015-11-16 LAB — RESPIRATORY PANEL BY PCR
Adenovirus: NOT DETECTED
BORDETELLA PERTUSSIS-RVPCR: NOT DETECTED
CORONAVIRUS OC43-RVPPCR: NOT DETECTED
Chlamydophila pneumoniae: NOT DETECTED
Coronavirus 229E: NOT DETECTED
Coronavirus HKU1: NOT DETECTED
Coronavirus NL63: NOT DETECTED
INFLUENZA A-RVPPCR: NOT DETECTED
INFLUENZA B-RVPPCR: NOT DETECTED
METAPNEUMOVIRUS-RVPPCR: NOT DETECTED
Mycoplasma pneumoniae: NOT DETECTED
PARAINFLUENZA VIRUS 1-RVPPCR: NOT DETECTED
PARAINFLUENZA VIRUS 2-RVPPCR: NOT DETECTED
PARAINFLUENZA VIRUS 3-RVPPCR: NOT DETECTED
PARAINFLUENZA VIRUS 4-RVPPCR: NOT DETECTED
RESPIRATORY SYNCYTIAL VIRUS-RVPPCR: NOT DETECTED
RHINOVIRUS / ENTEROVIRUS - RVPPCR: NOT DETECTED

## 2015-11-16 LAB — CBC
HCT: 45.6 % (ref 39.0–52.0)
HEMOGLOBIN: 15.3 g/dL (ref 13.0–17.0)
MCH: 30.3 pg (ref 26.0–34.0)
MCHC: 33.6 g/dL (ref 30.0–36.0)
MCV: 90.3 fL (ref 78.0–100.0)
Platelets: 249 10*3/uL (ref 150–400)
RBC: 5.05 MIL/uL (ref 4.22–5.81)
RDW: 15.1 % (ref 11.5–15.5)
WBC: 17 10*3/uL — AB (ref 4.0–10.5)

## 2015-11-16 LAB — PROTIME-INR
INR: 2.21
Prothrombin Time: 24.9 seconds — ABNORMAL HIGH (ref 11.4–15.2)

## 2015-11-16 LAB — GLUCOSE, CAPILLARY
GLUCOSE-CAPILLARY: 264 mg/dL — AB (ref 65–99)
GLUCOSE-CAPILLARY: 259 mg/dL — AB (ref 65–99)

## 2015-11-16 MED ORDER — IPRATROPIUM-ALBUTEROL 0.5-2.5 (3) MG/3ML IN SOLN: 3.0000 mL | RESPIRATORY_TRACT | Status: DC | PRN

## 2015-11-16 MED ORDER — DM-GUAIFENESIN ER 30-600 MG PO TB12: 1.0000 | ORAL_TABLET | Freq: Two times a day (BID) | ORAL | 0 refills | Status: DC | PRN

## 2015-11-16 MED ORDER — POTASSIUM CHLORIDE CRYS ER 20 MEQ PO TBCR: 40.0000 meq | EXTENDED_RELEASE_TABLET | Freq: Once | ORAL | Status: DC

## 2015-11-16 MED ORDER — PREDNISONE 10 MG PO TABS: ORAL_TABLET | ORAL | 0 refills | Status: DC

## 2015-11-16 MED ORDER — PRO-STAT SUGAR FREE PO LIQD
30.0000 mL | Freq: Two times a day (BID) | ORAL | Status: DC
  Filled 2015-11-16: qty 30

## 2015-11-16 MED ORDER — BUDESONIDE-FORMOTEROL FUMARATE 80-4.5 MCG/ACT IN AERO: 2.0000 | INHALATION_SPRAY | Freq: Every morning | RESPIRATORY_TRACT | 2 refills | Status: DC

## 2015-11-16 MED ORDER — AZITHROMYCIN 250 MG PO TABS: 250.0000 mg | ORAL_TABLET | Freq: Every day | ORAL | 0 refills | Status: DC

## 2015-11-16 MED ORDER — ALBUTEROL SULFATE HFA 108 (90 BASE) MCG/ACT IN AERS: 2.0000 | INHALATION_SPRAY | Freq: Four times a day (QID) | RESPIRATORY_TRACT | 2 refills | Status: DC | PRN

## 2015-11-16 MED ORDER — PRO-STAT SUGAR FREE PO LIQD: 30.0000 mL | Freq: Two times a day (BID) | ORAL | 0 refills | Status: DC

## 2015-11-16 MED ORDER — BUDESONIDE-FORMOTEROL FUMARATE 80-4.5 MCG/ACT IN AERO: 2.0000 | INHALATION_SPRAY | Freq: Every morning | RESPIRATORY_TRACT | 2 refills | Status: AC

## 2015-11-16 MED ORDER — ALBUTEROL SULFATE HFA 108 (90 BASE) MCG/ACT IN AERS: 2.0000 | INHALATION_SPRAY | Freq: Four times a day (QID) | RESPIRATORY_TRACT | 2 refills | Status: AC | PRN

## 2015-11-16 MED ORDER — ENSURE ENLIVE PO LIQD: 237.0000 mL | ORAL | 0 refills | Status: DC

## 2015-11-16 MED ORDER — DM-GUAIFENESIN ER 30-600 MG PO TB12
1.0000 | ORAL_TABLET | Freq: Two times a day (BID) | ORAL | 0 refills | Status: DC | PRN
Start: 2015-11-16 — End: 2015-11-16

## 2015-11-16 NOTE — Progress Notes (Signed)
Physical Therapy Treatment Patient Details Name: Justin Garner MRN: [id number] DOB: 1932-09-06 Today's Date: 11/16/2015    History of Present Illness Justin Garner is a 82 y.o. male with medical history significant of hypertension, hyperlipidemia, diabetes mellitus, COPD, GERD, hypothyroidism, PAF and PE on Coumadin, dCHF, CKD-II, lung cancer (s/p of topectomy, radiation and chemotherapy), who presents with fever and cough.    PT Comments    Pt making good progress and was able to initiate stair training.  Ambulating well with RW and encouraged to use this at home until he gets a bit stronger.  Pt with supportive wife.  Follow Up Recommendations  No PT follow up     Equipment Recommendations  Rolling walker with 5" wheels    Recommendations for Other Services       Precautions / Restrictions Precautions Precautions: Fall Restrictions Weight Bearing Restrictions: No    Mobility  Bed Mobility Overal bed mobility: Modified Independent                Transfers   Equipment used: None Transfers: Sit to/from Stand Sit to Stand: Supervision         General transfer comment: S only, no physical A needed  Ambulation/Gait Ambulation/Gait assistance: Supervision Ambulation Distance (Feet): 240 Feet Assistive device: Rolling walker (2 wheeled) Gait Pattern/deviations: Step-through pattern     General Gait Details: Steady with RW for longer distances.  Amb in room without AD. Discussed with pt and wife use of AD   Stairs Stairs: Yes Stairs assistance: Supervision Stair Management: Two rails;Alternating pattern;Step to pattern;Forwards Number of Stairs: 8 General stair comments: Pt fluctuated between step through and step to pattern  Wheelchair Mobility    Modified Rankin (Stroke Patients Only)       Balance           Standing balance support: No upper extremity supported Standing balance-Leahy Scale: Fair                       Cognition Arousal/Alertness: Awake/alert Behavior During Therapy: WFL for tasks assessed/performed Overall Cognitive Status: Within Functional Limits for tasks assessed                      Exercises      General Comments        Pertinent Vitals/Pain Pain Assessment: No/denies pain    Home Living                      Prior Function            PT Goals (current goals can now be found in the care plan section) Acute Rehab PT Goals Patient Stated Goal: return home PT Goal Formulation: With patient Time For Goal Achievement: 11/29/15 Potential to Achieve Goals: Good Progress towards PT goals: Progressing toward goals    Frequency  Min 3X/week    PT Plan Current plan remains appropriate    Co-evaluation             End of Session Equipment Utilized During Treatment: Gait belt Activity Tolerance: Patient tolerated treatment well Patient left: in chair;with call bell/phone within reach;with family/visitor present (wife stated she would be with pt and declined chair alarm)     Time: 3646-8032 PT Time Calculation (min) (ACUTE ONLY): 21 min  Charges:  $Gait Training: 8-22 mins  G Codes:      Justin Garner 11/16/2015, 11:10 AM

## 2015-11-16 NOTE — Discharge Summary (Signed)
Physician Discharge Summary  Justin Garner KCL:[id number] DOB: 12-30-1932 DOA: 11/14/2015  PCP: Justin Hams, MD  Admit date: 11/14/2015 Discharge date: 11/16/2015  Time spent: 45 minutes  Recommendations for Outpatient Follow-up:  Patient will be discharged to home.  Patient will need to follow up with primary care provider within one week of discharge, repeat BMP.  Patient should continue medications as prescribed.  Patient should follow a heart healthy/carb modified diet.   Discharge Diagnoses:  Principal Problem:   COPD exacerbation ([place]) Active Problems:   Chronic atrial fibrillation (HCC)   Essential hypertension, benign   Primary cancer of right upper lobe of lung (HCC)   Hypokalemia   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 (HCC)   Protein-calorie malnutrition, severe (HCC)   Hypothyroidism   HLD (hyperlipidemia)   Chronic diastolic CHF (congestive heart failure) ([place])   Acute on chronic kidney failure-II   Malnutrition of moderate degree   Discharge Condition: Stable  Diet recommendation: heart healthy/carb modified  Filed Weights   11/14/15 2105 11/15/15 0139 11/16/15 0540  Weight: 90.3 kg (199 lb 1.6 oz) 88.9 kg (195 lb 14.4 oz) 88.2 kg (194 lb 8 oz)    History of present illness:  on 11/14/2015 by Dr. Lupita Dawn Griffinis a 82 y.o.malewith medical history significant of hypertension, hyperlipidemia, diabetes mellitus, COPD, GERD, hypothyroidism, PAF and PE on Coumadin, dCHF, CKD-II, lung cancer (s/p of topectomy, radiation and chemotherapy), who presents with fever and cough.  Patient states that she started having fever andcough since last night. He had temperature of 100.6. Per his wife, patient was mildly confused earlier, but mental status isback to normal currently. Patient denies shortnessof breath and chest pain. He coughs up white mucus. Patient denies nausea, vomiting, abdominal pain, diarrhea, symptoms of UTI. No unilateral weakness. No  rashes.  Hospital Course:  COPD exacerbation -Patient presented with fever as well as cough. -Chest x-ray showed no infiltration, emphysematous changes chronic fibrosis noted -Respiratory viral panel negative -Placed on nebs, Solu-Medrol, azithromycin, Mucinex. Will discharge with azithromycin, prednisone taper.  -Blood cultures show no growth to date -Strep pneumoniae urine antigen negative -PT/OT consulted, no further needs  Atrial Fibrillation -CHADSVASC 5 -Continue Coumadin per pharmacy, INR 2.21 -Continue metoprolol as well as Cardizem  History of PE -on Coumadin  Essential hypertension  -Continue Lasix, metoprolol, Cardizem  Primary cancer of right upper lobe of lung -Patient has stage II a non-small cell lung cancer, s/p of concurrent chemoradiation followed by surgical resection. He is followed up by Dr. Julien Garner, last assay was on 09/06/15. He has been observation for several years with no evidence for disease per Dr. Worthy Garner note. -Follow-up with Dr. Julien Garner   Diabetes mellitus, type II  -Last A1c 7.1 on 07/02/15, well controlled.  -Continue insulin sliding scale CBG monitoring -Home medications held  Hyperlipidemia  -Last LDL was 43 on 06/15/15 -Continue statin  Hypothyroidism  -Last TSH was 5.913 on 08/06/15 -Continue Synthroid -TSH 1.213  Acute on chronic kidney failure, stage III -Baseline creatinine 1.1-1.2, currently 1.38, however GFR still in stage III -Creatinine today   Chronic diastolic CHF (congestive heart failure) -Patient appears to be compensated. He does have trace lower extremity edema -Continue metoprolol, Lasix -BNP 205  Protein-calorie malnutrition, moderate -Nutrition consult -Continue supplements  Hypokalemia -repleted, will give another dose of KCl (potassaium 3.5) -Repeat BMP in one week  Consultants None  Procedures  None  Discharge Exam: Vitals:   11/16/15 0540 11/16/15 1155  BP: 125/68 135/75  Pulse: 76  70  Resp: 18 18  Temp: 97.6 F (36.4 C) 97.9 F (36.6 C)    Exam  General: Well developed, well nourished, NAD, appears stated age  HEENT: NCAT, mucous membranes moist.   Cardiovascular: S1 S2 auscultated, irregular  Respiratory: Clear to auscultation, (no sounds RUL)  Back: RUL surgical scarring noted  Abdomen: Soft, nontender, nondistended, + bowel sounds  Extremities: warm dry without cyanosis clubbing, edema  Neuro: AAOx3, nonfocal  Psych: Appropriate mood and affect  Discharge Instructions  Current Discharge Medication List    START taking these medications   Details  Amino Acids-Protein Hydrolys (FEEDING SUPPLEMENT, PRO-STAT SUGAR FREE 64,) LIQD Take 30 mLs by mouth 2 (two) times daily with a meal. Qty: 900 mL, Refills: 0    azithromycin (ZITHROMAX) 250 MG tablet Take 1 tablet (250 mg total) by mouth daily. Qty: 4 each, Refills: 0    dextromethorphan-guaiFENesin (MUCINEX DM) 30-600 MG 12hr tablet Take 1 tablet by mouth 2 (two) times daily as needed for cough. Qty: 14 tablet, Refills: 0    feeding supplement, ENSURE ENLIVE, (ENSURE ENLIVE) LIQD Take 237 mLs by mouth daily. Qty: 30 Bottle, Refills: 0    predniSONE (DELTASONE) 10 MG tablet Take '40mg'$  (4 tabs) x 2 days, then taper to '30mg'$  (3 tabs) x 2 days, then '20mg'$  (2 tabs) x 2 days, then '10mg'$  (1 tab) x 2 days, then OFF. Qty: 20 tablet, Refills: 0      CONTINUE these medications which have NOT CHANGED   Details  atorvastatin (LIPITOR) 20 MG tablet Take 1 tablet (20 mg total) by mouth daily. Qty: 90 tablet, Refills: 3    diltiazem (CARDIZEM CD) 240 MG 24 hr capsule Take 1 capsule (240 mg total) by mouth daily. Qty: 90 capsule, Refills: 3    fish oil-omega-3 fatty acids 1000 MG capsule Take 1 g by mouth 2 (two) times daily.     furosemide (LASIX) 20 MG tablet Take 20 mg by mouth daily.     glipiZIDE (GLUCOTROL) 10 MG tablet Take 10 mg by mouth daily. Refills: 3    JANUVIA 50 MG tablet Take 50 mg by  mouth every morning.     levothyroxine (SYNTHROID, LEVOTHROID) 112 MCG tablet Take 112 mcg by mouth daily.      metoprolol tartrate (LOPRESSOR) 25 MG tablet Take 0.5 tablets (12.5 mg total) by mouth 2 (two) times daily. Qty: 90 tablet, Refills: 3    nitroGLYCERIN (NITROSTAT) 0.4 MG SL tablet Place 0.4 mg under the tongue every 5 (five) minutes as needed for chest pain (UP TO 3 DOSES BEFORE CALLING EMS). Reported on 09/06/2015    omeprazole (PRILOSEC) 20 MG capsule Take 20 mg by mouth daily.      warfarin (COUMADIN) 7.5 MG tablet Take 7.5-11.25 mg by mouth See admin instructions. Take 1 tablet on Thursdays then take 1 and 1/2 tablets all the other days    FREESTYLE LITE test strip Refills: 1       No Known Allergies    The results of significant diagnostics from this hospitalization (including imaging, microbiology, ancillary and laboratory) are listed below for reference.    Significant Diagnostic Studies: Dg Chest 2 View  Result Date: 11/14/2015 CLINICAL DATA:  Productive cough and fever for 1 day. History of right upper lobe lung cancer in 2015. Previous history of CHF, COPD, coronary disease, hypertension, diabetes, pulmonary embolus. Former smoker. EXAM: CHEST  2 VIEW COMPARISON:  06/28/2015 FINDINGS: Normal heart size and pulmonary vascularity. Diffuse  interstitial changes throughout the lungs likely due to fibrosis. Scarring in the left lung apex. Scarring and pleural thickening in the right costophrenic angle. Postoperative changes in the right upper chest with posterior apical rib resections. Emphysematous changes in the lungs. No significant change since previous study. No superimposed consolidation. Calcified and tortuous aorta. IMPRESSION: Emphysematous changes, chronic fibrosis, right pleural thickening, and postoperative changes. No evidence of active pulmonary disease. Electronically Signed   By: Lucienne Capers M.D.   On: 11/14/2015 22:08    Microbiology: Recent Results  (from the past 240 hour(s))  Culture, blood (routine x 2) Call MD if unable to obtain prior to antibiotics being given     Status: None (Preliminary result)   Collection Time: 11/15/15  3:03 AM  Result Value Ref Range Status   Specimen Description BLOOD LEFT ANTECUBITAL  Final   Special Requests BOTTLES DRAWN AEROBIC AND ANAEROBIC 5CC  Final   Culture NO GROWTH 1 DAY  Final   Report Status PENDING  Incomplete  Culture, blood (routine x 2) Call MD if unable to obtain prior to antibiotics being given     Status: None (Preliminary result)   Collection Time: 11/15/15  3:12 AM  Result Value Ref Range Status   Specimen Description BLOOD RIGHT ANTECUBITAL  Final   Special Requests BOTTLES DRAWN AEROBIC AND ANAEROBIC 5CC  Final   Culture NO GROWTH 1 DAY  Final   Report Status PENDING  Incomplete  Respiratory Panel by PCR     Status: None   Collection Time: 11/15/15 10:40 PM  Result Value Ref Range Status   Adenovirus NOT DETECTED NOT DETECTED Final   Coronavirus 229E NOT DETECTED NOT DETECTED Final   Coronavirus HKU1 NOT DETECTED NOT DETECTED Final   Coronavirus NL63 NOT DETECTED NOT DETECTED Final   Coronavirus OC43 NOT DETECTED NOT DETECTED Final   Metapneumovirus NOT DETECTED NOT DETECTED Final   Rhinovirus / Enterovirus NOT DETECTED NOT DETECTED Final   Influenza A NOT DETECTED NOT DETECTED Final   Influenza B NOT DETECTED NOT DETECTED Final   Parainfluenza Virus 1 NOT DETECTED NOT DETECTED Final   Parainfluenza Virus 2 NOT DETECTED NOT DETECTED Final   Parainfluenza Virus 3 NOT DETECTED NOT DETECTED Final   Parainfluenza Virus 4 NOT DETECTED NOT DETECTED Final   Respiratory Syncytial Virus NOT DETECTED NOT DETECTED Final   Bordetella pertussis NOT DETECTED NOT DETECTED Final   Chlamydophila pneumoniae NOT DETECTED NOT DETECTED Final   Mycoplasma pneumoniae NOT DETECTED NOT DETECTED Final     Labs: Basic Metabolic Panel:  Recent Labs Lab 11/14/15 2134 11/16/15 0753  NA 135 138   K 3.4* 3.5  CL 100* 104  CO2 24 25  GLUCOSE 85 203*  BUN 18 25*  CREATININE 1.38* 1.14  CALCIUM 8.5* 9.2   Liver Function Tests:  Recent Labs Lab 11/14/15 2134  AST 27  ALT 19  ALKPHOS 163*  BILITOT 2.4*  PROT 6.8  ALBUMIN 3.7   No results for input(s): LIPASE, AMYLASE in the last 168 hours. No results for input(s): AMMONIA in the last 168 hours. CBC:  Recent Labs Lab 11/14/15 2134 11/16/15 0753  WBC 14.4* 17.0*  NEUTROABS 10.2*  --   HGB 15.6 15.3  HCT 47.2 45.6  MCV 91.1 90.3  PLT 228 249   Cardiac Enzymes: No results for input(s): CKTOTAL, CKMB, CKMBINDEX, TROPONINI in the last 168 hours. BNP: BNP (last 3 results)  Recent Labs  06/14/15 0306 06/22/15 0244 11/15/15 0335  BNP  189.0* 101.1* 205.8*    ProBNP (last 3 results) No results for input(s): PROBNP in the last 8760 hours.  CBG:  Recent Labs Lab 11/15/15 1136 11/15/15 1652 11/15/15 2146 11/16/15 0600 11/16/15 1127  GLUCAP 306* 276* 258* 264* 259*       Signed:  Cristal Ford  [facility] 11/16/2015, 12:27 PM

## 2015-11-16 NOTE — Progress Notes (Signed)
Patient finishing lunch, will review discharge instructions when finished and will complete discharge. When ready we will contact volunteer services to transport patient to the car. No questions or concerns at this time. IV has been removed.

## 2015-11-16 NOTE — Care Management Note (Signed)
Case Management Note  Patient Details  Name: Justin Garner MRN: [id number] Date of Birth: February 21, 1933  Subjective/Objective:        Admitted wot Observation for COPD            Action/Plan: Patient lives at home with spouse, independent of all of his ADL's, continue to drive his car; DME - use a cane as needed; PCP is Dr Delfina Redwood; has private insurance with BCBS with prescription drug coverage; pharmacy of choice is [facility] pt reports no problem getting his medication. No needs identified at this time.  Expected Discharge Date:    11/16/2015              Expected Discharge Plan:  Home/Self Care   Discharge planning Services  CM Consult    Choice offered to:  NA   Status of Service:  In process, will continue to follow  Sherrilyn Rist [phone] 11/16/2015, 1:39 PM

## 2015-11-16 NOTE — Progress Notes (Signed)
Patient states he feels better today and would like to know when he may go home. PT working with him and he walked the hall, no concerns at this time.

## 2015-11-16 NOTE — Telephone Encounter (Signed)
Called spoke with pt's wife.  Pt was in hospital 11/14/15-11/16/15 discharged on Prednisone taper '40mg'$  x 2, '30mg'$  x 2, '20mg'$  x 2, '10mg'$  x 2, then stop and zpack.  INR 2.21 at hospital on 11/16/15.  Made pt appt on Friday 11/19/15 to check INR secondary to being on Prednisone.

## 2015-11-16 NOTE — Care Management Obs Status (Signed)
MEDICARE OBSERVATION STATUS NOTIFICATION   Patient Details  Name: Justin Garner MRN: [id number] Date of Birth: 12/07/1932   Medicare Observation Status Notification Given:  Yes    Royston Bake, RN 11/16/2015, 1:38 PM

## 2015-11-16 NOTE — Progress Notes (Signed)
[place] for Coumadin Indication: PE and afib  No Known Allergies  Patient Measurements: Height: '6\' 1"'$  (185.4 cm) Weight: 194 lb 8 oz (88.2 kg) IBW/kg (Calculated) : 79.9  Vital Signs: Temp: 97.6 F (36.4 C) (08/29 0540) Temp Source: Oral (08/29 0540) BP: 125/68 (08/29 0540) Pulse Rate: 76 (08/29 0540)  Labs:  Recent Labs  11/14/15 2134 11/15/15 0335  HGB 15.6  --   HCT 47.2  --   PLT 228  --   LABPROT  --  24.9*  INR  --  2.21  CREATININE 1.38*  --     Estimated Creatinine Clearance: 47.4 mL/min (by C-G formula based on SCr of 1.38 mg/dL).   Medical History: Past Medical History:  Diagnosis Date  . ASCVD (arteriosclerotic cardiovascular disease)   . Cancer [facility]) 2009   Upper lobectomy;radiation; chemotherapy  . Chronic systolic CHF (congestive heart failure) ([place])   . CKD (chronic kidney disease), stage II   . COPD (chronic obstructive pulmonary disease) ([place])   . Coronary artery disease   . Diabetes mellitus   . GERD (gastroesophageal reflux disease)   . Hyperlipidemia   . Hypertension   . Leukocytosis 07/12/2015  . PAF (paroxysmal atrial fibrillation) (HCC)    Chronic warfarin therapy  . 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 Pulmonary embolism (HCC)    PMH of  . Thyroid disease     Medications:  Prescriptions Prior to Admission  Medication Sig Dispense Refill Last Dose  . atorvastatin (LIPITOR) 20 MG tablet Take 1 tablet (20 mg total) by mouth daily. 90 tablet 3 11/14/2015 at Unknown time  . diltiazem (CARDIZEM CD) 240 MG 24 hr capsule Take 1 capsule (240 mg total) by mouth daily. 90 capsule 3 11/14/2015 at Unknown time  . fish oil-omega-3 fatty acids 1000 MG capsule Take 1 g by mouth 2 (two) times daily.    11/14/2015 at Unknown time  .  furosemide (LASIX) 20 MG tablet Take 20 mg by mouth daily.    11/14/2015 at Unknown time  . glipiZIDE (GLUCOTROL) 10 MG tablet Take 10 mg by mouth daily.  3 11/14/2015 at Unknown time  . JANUVIA 50 MG tablet Take 50 mg by mouth every morning.    11/14/2015 at Unknown time  . levothyroxine (SYNTHROID, LEVOTHROID) 112 MCG tablet Take 112 mcg by mouth daily.     11/14/2015 at Unknown time  . metoprolol tartrate (LOPRESSOR) 25 MG tablet Take 0.5 tablets (12.5 mg total) by mouth 2 (two) times daily. 90 tablet 3 11/14/2015 at 0800  . nitroGLYCERIN (NITROSTAT) 0.4 MG SL tablet Place 0.4 mg under the tongue every 5 (five) minutes as needed for chest pain (UP TO 3 DOSES BEFORE CALLING EMS). Reported on 09/06/2015   unknown  . omeprazole (PRILOSEC) 20 MG capsule Take 20 mg by mouth daily.     11/14/2015 at Unknown time  . warfarin (COUMADIN) 7.5 MG tablet Take 7.5-11.25 mg by mouth See admin instructions. Take 1 tablet on Thursdays then take 1 and 1/2 tablets all the other days   11/14/2015 at 0800  . FREESTYLE LITE test strip   1 Taking  . warfarin (COUMADIN) 5 MG tablet Take as directed by Coumadin Clinic (Patient not taking: Reported on 11/14/2015) 135 tablet 0 Not Taking at Unknown time    Assessment:  82 y.o.  M presents with fever and cough. Pt on coumadin PTA for h/o PE and afib. CBC stable. Home dose: 7.'5mg'$  Thurs and 11.'25mg'$  AODs - last dose 8/27 1800.  Goal of Therapy:  INR 2-3 Monitor platelets by anticoagulation protocol: Yes   Plan:  Warfarin 7.'5mg'$  tonight x1 Daily INR Monitor s/sx of bleeding  Andrey Cota. Diona Foley, PharmD, BCPS Clinical Pharmacist Pager [phone] 11/16/2015,8:16 AM

## 2015-11-16 NOTE — ED Provider Notes (Signed)
Pt is a 82 y.o. male who presents with  Chief Complaint  Patient presents with  . Fever  . Cough    Physical Exam  Constitutional: No distress.  elderly  HENT:  Head: Normocephalic and atraumatic.  Eyes: Conjunctivae are normal. Left eye exhibits no discharge. No scleral icterus.  Neck: No tracheal deviation present. No thyromegaly present.  Pulmonary/Chest: Effort normal and breath sounds normal. No stridor.  ronchi  Abdominal: He exhibits no distension.  Musculoskeletal: He exhibits no tenderness or deformity.  Neurological: He is alert. Cranial nerve deficit: no facial droop.  Skin: Skin is warm. No rash noted. He is not diaphoretic. No erythema.  Psychiatric: Affect normal.    Clinical Course  Treated with abx, nebs, steroids.  Admitted for further treatment.  COPD exacerbation (HCC)  Fever, unspecified fever cause   Medical screening examination/treatment/procedure(s) were conducted as a shared visit with non-physician practitioner(s) and myself.  I personally evaluated the patient during the encounter.        Dorie Rank, MD 11/16/15 1049

## 2015-11-16 NOTE — Progress Notes (Signed)
Respiratory Panel PCR sent at 22:30 last night. Will continue to monitor.

## 2015-11-16 NOTE — Progress Notes (Signed)
Patient doing well, up to the bathroom independently. Bathed self and clean gown applied. His only question is "can I go home now". Good appetite, good attitude, wife is at the bedside.

## 2015-11-16 NOTE — Telephone Encounter (Signed)
New message       Calling to let coumadin clinic know that he was in the hosp from Sunday midnight until 2:30 today.  His next pt/inr appt is sept 6.  Should he come in sooner?

## 2015-11-18 LAB — UREA NITROGEN, URINE: Urea Nitrogen, Ur: 961 mg/dL

## 2015-11-19 ENCOUNTER — Ambulatory Visit (INDEPENDENT_AMBULATORY_CARE_PROVIDER_SITE_OTHER): Payer: Medicare Other | Admitting: *Deleted

## 2015-11-19 DIAGNOSIS — I482 Chronic atrial fibrillation, unspecified: Secondary | ICD-10-CM

## 2015-11-19 DIAGNOSIS — I4891 Unspecified atrial fibrillation: Secondary | ICD-10-CM | POA: Diagnosis not present

## 2015-11-19 DIAGNOSIS — I2699 Other pulmonary embolism without acute cor pulmonale: Secondary | ICD-10-CM | POA: Diagnosis not present

## 2015-11-19 LAB — POCT INR: INR: 4.8

## 2015-11-20 LAB — CULTURE, BLOOD (ROUTINE X 2)
CULTURE: NO GROWTH
CULTURE: NO GROWTH

## 2015-11-25 ENCOUNTER — Ambulatory Visit (INDEPENDENT_AMBULATORY_CARE_PROVIDER_SITE_OTHER): Payer: Medicare Other | Admitting: *Deleted

## 2015-11-25 DIAGNOSIS — I2699 Other pulmonary embolism without acute cor pulmonale: Secondary | ICD-10-CM

## 2015-11-25 DIAGNOSIS — I4891 Unspecified atrial fibrillation: Secondary | ICD-10-CM | POA: Diagnosis not present

## 2015-11-25 DIAGNOSIS — I482 Chronic atrial fibrillation, unspecified: Secondary | ICD-10-CM

## 2015-11-25 LAB — POCT INR: INR: 3.1

## 2015-12-13 ENCOUNTER — Ambulatory Visit (INDEPENDENT_AMBULATORY_CARE_PROVIDER_SITE_OTHER): Payer: Medicare Other | Admitting: *Deleted

## 2015-12-13 DIAGNOSIS — I4891 Unspecified atrial fibrillation: Secondary | ICD-10-CM | POA: Diagnosis not present

## 2015-12-13 DIAGNOSIS — I2699 Other pulmonary embolism without acute cor pulmonale: Secondary | ICD-10-CM | POA: Diagnosis not present

## 2015-12-13 DIAGNOSIS — I482 Chronic atrial fibrillation, unspecified: Secondary | ICD-10-CM

## 2015-12-13 LAB — POCT INR: INR: 2.1

## 2016-01-03 ENCOUNTER — Ambulatory Visit (INDEPENDENT_AMBULATORY_CARE_PROVIDER_SITE_OTHER): Payer: Medicare Other

## 2016-01-03 DIAGNOSIS — I482 Chronic atrial fibrillation, unspecified: Secondary | ICD-10-CM

## 2016-01-03 DIAGNOSIS — I2699 Other pulmonary embolism without acute cor pulmonale: Secondary | ICD-10-CM | POA: Diagnosis not present

## 2016-01-03 DIAGNOSIS — I4891 Unspecified atrial fibrillation: Secondary | ICD-10-CM | POA: Diagnosis not present

## 2016-01-03 LAB — POCT INR: INR: 2.3

## 2016-01-14 ENCOUNTER — Other Ambulatory Visit: Payer: Self-pay | Admitting: *Deleted

## 2016-01-14 MED ORDER — WARFARIN SODIUM 5 MG PO TABS: ORAL_TABLET | ORAL | 0 refills | Status: DC

## 2016-01-31 ENCOUNTER — Ambulatory Visit (INDEPENDENT_AMBULATORY_CARE_PROVIDER_SITE_OTHER): Payer: Medicare Other | Admitting: *Deleted

## 2016-01-31 DIAGNOSIS — I482 Chronic atrial fibrillation, unspecified: Secondary | ICD-10-CM

## 2016-01-31 DIAGNOSIS — I2699 Other pulmonary embolism without acute cor pulmonale: Secondary | ICD-10-CM | POA: Diagnosis not present

## 2016-01-31 DIAGNOSIS — I4891 Unspecified atrial fibrillation: Secondary | ICD-10-CM

## 2016-01-31 LAB — POCT INR: INR: 2.5

## 2016-03-14 ENCOUNTER — Ambulatory Visit (INDEPENDENT_AMBULATORY_CARE_PROVIDER_SITE_OTHER): Payer: Medicare Other | Admitting: *Deleted

## 2016-03-14 DIAGNOSIS — I2699 Other pulmonary embolism without acute cor pulmonale: Secondary | ICD-10-CM

## 2016-03-14 DIAGNOSIS — I482 Chronic atrial fibrillation, unspecified: Secondary | ICD-10-CM

## 2016-03-14 DIAGNOSIS — I4891 Unspecified atrial fibrillation: Secondary | ICD-10-CM | POA: Diagnosis not present

## 2016-03-14 LAB — POCT INR: INR: 2.6

## 2016-03-22 ENCOUNTER — Other Ambulatory Visit: Payer: Self-pay | Admitting: *Deleted

## 2016-03-22 MED ORDER — WARFARIN SODIUM 5 MG PO TABS: ORAL_TABLET | ORAL | 0 refills | Status: DC

## 2016-04-25 ENCOUNTER — Ambulatory Visit (INDEPENDENT_AMBULATORY_CARE_PROVIDER_SITE_OTHER): Payer: Medicare Other | Admitting: *Deleted

## 2016-04-25 DIAGNOSIS — I2699 Other pulmonary embolism without acute cor pulmonale: Secondary | ICD-10-CM | POA: Diagnosis not present

## 2016-04-25 DIAGNOSIS — I482 Chronic atrial fibrillation, unspecified: Secondary | ICD-10-CM

## 2016-04-25 DIAGNOSIS — I4891 Unspecified atrial fibrillation: Secondary | ICD-10-CM

## 2016-04-25 LAB — POCT INR: INR: 2.5

## 2016-06-06 ENCOUNTER — Ambulatory Visit (INDEPENDENT_AMBULATORY_CARE_PROVIDER_SITE_OTHER): Payer: Medicare Other | Admitting: *Deleted

## 2016-06-06 DIAGNOSIS — I482 Chronic atrial fibrillation, unspecified: Secondary | ICD-10-CM

## 2016-06-06 DIAGNOSIS — I2699 Other pulmonary embolism without acute cor pulmonale: Secondary | ICD-10-CM | POA: Diagnosis not present

## 2016-06-06 DIAGNOSIS — I4891 Unspecified atrial fibrillation: Secondary | ICD-10-CM

## 2016-06-06 LAB — POCT INR: INR: 2.4

## 2016-06-09 ENCOUNTER — Encounter (HOSPITAL_COMMUNITY): Payer: Self-pay

## 2016-06-09 ENCOUNTER — Inpatient Hospital Stay (HOSPITAL_COMMUNITY)
Admission: EM | Admit: 2016-06-09 | Discharge: 2016-06-12 | DRG: 871 | Disposition: A | Discharge status: Home / Self Care | Payer: Medicare Other | Attending: Family Medicine | Admitting: Family Medicine

## 2016-06-09 ENCOUNTER — Emergency Department (HOSPITAL_COMMUNITY): Payer: Medicare Other

## 2016-06-09 DIAGNOSIS — N182 Chronic kidney disease, stage 2 (mild): Secondary | ICD-10-CM | POA: Diagnosis present

## 2016-06-09 DIAGNOSIS — Z9221 Personal history of antineoplastic chemotherapy: Secondary | ICD-10-CM | POA: Diagnosis not present

## 2016-06-09 DIAGNOSIS — C3490 Malignant neoplasm of unspecified part of unspecified bronchus or lung: Secondary | ICD-10-CM | POA: Diagnosis present

## 2016-06-09 DIAGNOSIS — Z87891 Personal history of nicotine dependence: Secondary | ICD-10-CM

## 2016-06-09 DIAGNOSIS — Z79899 Other long term (current) drug therapy: Secondary | ICD-10-CM

## 2016-06-09 DIAGNOSIS — I5042 Chronic combined systolic (congestive) and diastolic (congestive) heart failure: Secondary | ICD-10-CM | POA: Diagnosis present

## 2016-06-09 DIAGNOSIS — I130 Hypertensive heart and chronic kidney disease with heart failure and stage 1 through stage 4 chronic kidney disease, or unspecified chronic kidney disease: Secondary | ICD-10-CM | POA: Diagnosis present

## 2016-06-09 DIAGNOSIS — I10 Essential (primary) hypertension: Secondary | ICD-10-CM | POA: Diagnosis not present

## 2016-06-09 DIAGNOSIS — J9621 Acute and chronic respiratory failure with hypoxia: Secondary | ICD-10-CM | POA: Diagnosis present

## 2016-06-09 DIAGNOSIS — Z86711 Personal history of pulmonary embolism: Secondary | ICD-10-CM

## 2016-06-09 DIAGNOSIS — E1122 Type 2 diabetes mellitus with diabetic chronic kidney disease: Secondary | ICD-10-CM | POA: Diagnosis present

## 2016-06-09 DIAGNOSIS — Z902 Acquired absence of lung [part of]: Secondary | ICD-10-CM

## 2016-06-09 DIAGNOSIS — G9340 Encephalopathy, unspecified: Secondary | ICD-10-CM | POA: Diagnosis present

## 2016-06-09 DIAGNOSIS — Z85118 Personal history of other malignant neoplasm of bronchus and lung: Secondary | ICD-10-CM | POA: Diagnosis not present

## 2016-06-09 DIAGNOSIS — I2510 Atherosclerotic heart disease of native coronary artery without angina pectoris: Secondary | ICD-10-CM | POA: Diagnosis present

## 2016-06-09 DIAGNOSIS — E039 Hypothyroidism, unspecified: Secondary | ICD-10-CM | POA: Diagnosis present

## 2016-06-09 DIAGNOSIS — J441 Chronic obstructive pulmonary disease with (acute) exacerbation: Secondary | ICD-10-CM | POA: Diagnosis present

## 2016-06-09 DIAGNOSIS — Z6824 Body mass index (BMI) 24.0-24.9, adult: Secondary | ICD-10-CM

## 2016-06-09 DIAGNOSIS — E785 Hyperlipidemia, unspecified: Secondary | ICD-10-CM | POA: Diagnosis not present

## 2016-06-09 DIAGNOSIS — Z7952 Long term (current) use of systemic steroids: Secondary | ICD-10-CM

## 2016-06-09 DIAGNOSIS — J209 Acute bronchitis, unspecified: Secondary | ICD-10-CM | POA: Diagnosis not present

## 2016-06-09 DIAGNOSIS — I480 Paroxysmal atrial fibrillation: Secondary | ICD-10-CM | POA: Diagnosis present

## 2016-06-09 DIAGNOSIS — E43 Unspecified severe protein-calorie malnutrition: Secondary | ICD-10-CM | POA: Diagnosis present

## 2016-06-09 DIAGNOSIS — I4891 Unspecified atrial fibrillation: Secondary | ICD-10-CM | POA: Diagnosis present

## 2016-06-09 DIAGNOSIS — K219 Gastro-esophageal reflux disease without esophagitis: Secondary | ICD-10-CM | POA: Diagnosis present

## 2016-06-09 DIAGNOSIS — A419 Sepsis, unspecified organism: Secondary | ICD-10-CM | POA: Diagnosis present

## 2016-06-09 DIAGNOSIS — G9341 Metabolic encephalopathy: Secondary | ICD-10-CM | POA: Diagnosis present

## 2016-06-09 DIAGNOSIS — I5032 Chronic diastolic (congestive) heart failure: Secondary | ICD-10-CM | POA: Diagnosis not present

## 2016-06-09 DIAGNOSIS — Z7901 Long term (current) use of anticoagulants: Secondary | ICD-10-CM | POA: Diagnosis not present

## 2016-06-09 DIAGNOSIS — J440 Chronic obstructive pulmonary disease with acute lower respiratory infection: Secondary | ICD-10-CM | POA: Diagnosis present

## 2016-06-09 DIAGNOSIS — I2699 Other pulmonary embolism without acute cor pulmonale: Secondary | ICD-10-CM | POA: Diagnosis present

## 2016-06-09 DIAGNOSIS — Z7984 Long term (current) use of oral hypoglycemic drugs: Secondary | ICD-10-CM

## 2016-06-09 DIAGNOSIS — J189 Pneumonia, unspecified organism: Secondary | ICD-10-CM | POA: Diagnosis not present

## 2016-06-09 LAB — CBC
HCT: 46.2 % (ref 39.0–52.0)
Hemoglobin: 15.8 g/dL (ref 13.0–17.0)
MCH: 30.6 pg (ref 26.0–34.0)
MCHC: 34.2 g/dL (ref 30.0–36.0)
MCV: 89.5 fL (ref 78.0–100.0)
Platelets: 286 K/uL (ref 150–400)
RBC: 5.16 MIL/uL (ref 4.22–5.81)
RDW: 15.6 % — ABNORMAL HIGH (ref 11.5–15.5)
WBC: 13.1 K/uL — ABNORMAL HIGH (ref 4.0–10.5)

## 2016-06-09 LAB — COMPREHENSIVE METABOLIC PANEL
ALBUMIN: 3.5 g/dL (ref 3.5–5.0)
ALK PHOS: 180 U/L — AB (ref 38–126)
ALT: 28 U/L (ref 17–63)
ANION GAP: 13 (ref 5–15)
AST: 41 U/L (ref 15–41)
BUN: 19 mg/dL (ref 6–20)
CALCIUM: 8.6 mg/dL — AB (ref 8.9–10.3)
CHLORIDE: 104 mmol/L (ref 101–111)
CO2: 23 mmol/L (ref 22–32)
Creatinine, Ser: 1.36 mg/dL — ABNORMAL HIGH (ref 0.61–1.24)
GFR calc Af Amer: 54 mL/min — ABNORMAL LOW (ref >60)
GFR calc non Af Amer: 47 mL/min — ABNORMAL LOW (ref >60)
GLUCOSE: 110 mg/dL — AB (ref 65–99)
POTASSIUM: 3.7 mmol/L (ref 3.5–5.1)
SODIUM: 140 mmol/L (ref 135–145)
Total Bilirubin: 2 mg/dL — ABNORMAL HIGH (ref 0.3–1.2)
Total Protein: 6.4 g/dL — ABNORMAL LOW (ref 6.5–8.1)

## 2016-06-09 LAB — I-STAT TROPONIN, ED
TROPONIN I, POC: 0.02 ng/mL (ref 0.00–0.08)
Troponin i, poc: 0.01 ng/mL (ref 0.00–0.08)

## 2016-06-09 LAB — URINALYSIS, ROUTINE W REFLEX MICROSCOPIC
Bacteria, UA: NONE SEEN
Bilirubin Urine: NEGATIVE
GLUCOSE, UA: NEGATIVE mg/dL
KETONES UR: NEGATIVE mg/dL
LEUKOCYTES UA: NEGATIVE
NITRITE: NEGATIVE
PH: 5 (ref 5.0–8.0)
Protein, ur: 100 mg/dL — AB
SPECIFIC GRAVITY, URINE: 1.017 (ref 1.005–1.030)
Squamous Epithelial / LPF: NONE SEEN

## 2016-06-09 LAB — PROTIME-INR
INR: 2.36
Prothrombin Time: 26.3 seconds — ABNORMAL HIGH (ref 11.4–15.2)

## 2016-06-09 LAB — I-STAT CHEM 8, ED
BUN: 22 mg/dL — ABNORMAL HIGH (ref 6–20)
Calcium, Ion: 1.04 mmol/L — ABNORMAL LOW (ref 1.15–1.40)
Chloride: 103 mmol/L (ref 101–111)
Creatinine, Ser: 1.2 mg/dL (ref 0.61–1.24)
Glucose, Bld: 109 mg/dL — ABNORMAL HIGH (ref 65–99)
HEMATOCRIT: 49 % (ref 39.0–52.0)
HEMOGLOBIN: 16.7 g/dL (ref 13.0–17.0)
POTASSIUM: 3.5 mmol/L (ref 3.5–5.1)
SODIUM: 144 mmol/L (ref 135–145)
TCO2: 26 mmol/L (ref 0–100)

## 2016-06-09 LAB — I-STAT CG4 LACTIC ACID, ED: LACTIC ACID, VENOUS: 1.64 mmol/L (ref 0.5–1.9)

## 2016-06-09 MED ORDER — IPRATROPIUM BROMIDE 0.02 % IN SOLN
0.5000 mg | RESPIRATORY_TRACT | Status: DC
  Administered 2016-06-10: 0.5 mg via RESPIRATORY_TRACT
  Filled 2016-06-09: qty 2.5

## 2016-06-09 MED ORDER — ATORVASTATIN CALCIUM 20 MG PO TABS
20.0000 mg | ORAL_TABLET | Freq: Every day | ORAL | Status: DC
  Administered 2016-06-10 – 2016-06-11 (×2): 20 mg via ORAL
  Filled 2016-06-09 (×2): qty 1

## 2016-06-09 MED ORDER — ZOLPIDEM TARTRATE 5 MG PO TABS: 5.0000 mg | ORAL_TABLET | Freq: Every evening | ORAL | Status: DC | PRN

## 2016-06-09 MED ORDER — OMEGA-3-ACID ETHYL ESTERS 1 G PO CAPS
1.0000 g | ORAL_CAPSULE | Freq: Two times a day (BID) | ORAL | Status: DC
  Administered 2016-06-10 – 2016-06-12 (×5): 1 g via ORAL
  Filled 2016-06-09 (×5): qty 1

## 2016-06-09 MED ORDER — PRO-STAT SUGAR FREE PO LIQD
30.0000 mL | Freq: Two times a day (BID) | ORAL | Status: DC
  Administered 2016-06-10 – 2016-06-12 (×2): 30 mL via ORAL
  Filled 2016-06-09 (×2): qty 30

## 2016-06-09 MED ORDER — DEXTROSE 5 % IV SOLN
500.0000 mg | Freq: Every day | INTRAVENOUS | Status: DC
  Administered 2016-06-10: 500 mg via INTRAVENOUS
  Filled 2016-06-09: qty 500

## 2016-06-09 MED ORDER — SODIUM CHLORIDE 0.9 % IV SOLN: Freq: Once | INTRAVENOUS | Status: DC

## 2016-06-09 MED ORDER — OSELTAMIVIR PHOSPHATE 75 MG PO CAPS: 75.0000 mg | ORAL_CAPSULE | Freq: Once | ORAL | Status: DC

## 2016-06-09 MED ORDER — DM-GUAIFENESIN ER 30-600 MG PO TB12
1.0000 | ORAL_TABLET | Freq: Two times a day (BID) | ORAL | Status: DC | PRN
  Administered 2016-06-10: 1 via ORAL
  Filled 2016-06-09: qty 1

## 2016-06-09 MED ORDER — METOPROLOL TARTRATE 12.5 MG HALF TABLET
12.5000 mg | ORAL_TABLET | Freq: Two times a day (BID) | ORAL | Status: DC
  Administered 2016-06-10: 12.5 mg via ORAL
  Filled 2016-06-09: qty 1

## 2016-06-09 MED ORDER — METHYLPREDNISOLONE SODIUM SUCC 125 MG IJ SOLR
60.0000 mg | Freq: Two times a day (BID) | INTRAMUSCULAR | Status: DC
  Administered 2016-06-10: 60 mg via INTRAVENOUS
  Filled 2016-06-09: qty 2

## 2016-06-09 MED ORDER — SODIUM CHLORIDE 0.9 % IV BOLUS (SEPSIS)
1000.0000 mL | Freq: Once | INTRAVENOUS | Status: AC
  Administered 2016-06-09: 1000 mL via INTRAVENOUS

## 2016-06-09 MED ORDER — LEVOTHYROXINE SODIUM 112 MCG PO TABS
112.0000 ug | ORAL_TABLET | Freq: Every day | ORAL | Status: DC
  Administered 2016-06-10 – 2016-06-12 (×3): 112 ug via ORAL
  Filled 2016-06-09 (×3): qty 1

## 2016-06-09 MED ORDER — NITROGLYCERIN 0.4 MG SL SUBL: 0.4000 mg | SUBLINGUAL_TABLET | SUBLINGUAL | Status: DC | PRN

## 2016-06-09 MED ORDER — WARFARIN SODIUM 7.5 MG PO TABS
7.5000 mg | ORAL_TABLET | ORAL | Status: DC
  Administered 2016-06-10: 7.5 mg via ORAL
  Filled 2016-06-09: qty 2

## 2016-06-09 MED ORDER — LEVALBUTEROL HCL 1.25 MG/0.5ML IN NEBU
1.2500 mg | INHALATION_SOLUTION | Freq: Four times a day (QID) | RESPIRATORY_TRACT | Status: DC
Start: 2016-06-10 — End: 2016-06-10
  Administered 2016-06-10: 1.25 mg via RESPIRATORY_TRACT
  Filled 2016-06-09 (×3): qty 0.5

## 2016-06-09 MED ORDER — DILTIAZEM HCL ER COATED BEADS 240 MG PO CP24
240.0000 mg | ORAL_CAPSULE | Freq: Every day | ORAL | Status: DC
  Administered 2016-06-10 – 2016-06-12 (×3): 240 mg via ORAL
  Filled 2016-06-09 (×3): qty 1

## 2016-06-09 MED ORDER — INSULIN ASPART 100 UNIT/ML ~~LOC~~ SOLN
0.0000 [IU] | Freq: Three times a day (TID) | SUBCUTANEOUS | Status: DC
  Administered 2016-06-10: 7 [IU] via SUBCUTANEOUS
  Administered 2016-06-10: 3 [IU] via SUBCUTANEOUS

## 2016-06-09 MED ORDER — IPRATROPIUM-ALBUTEROL 0.5-2.5 (3) MG/3ML IN SOLN
3.0000 mL | Freq: Once | RESPIRATORY_TRACT | Status: AC
  Administered 2016-06-09: 3 mL via RESPIRATORY_TRACT
  Filled 2016-06-09: qty 3

## 2016-06-09 MED ORDER — DEXTROSE 5 % IV SOLN
1.0000 g | Freq: Every day | INTRAVENOUS | Status: DC
  Administered 2016-06-10 – 2016-06-11 (×2): 1 g via INTRAVENOUS
  Filled 2016-06-09 (×2): qty 10

## 2016-06-09 MED ORDER — WARFARIN - PHARMACIST DOSING INPATIENT: Freq: Every day | Status: DC

## 2016-06-09 MED ORDER — VANCOMYCIN HCL 10 G IV SOLR
1500.0000 mg | Freq: Once | INTRAVENOUS | Status: AC
  Administered 2016-06-09: 1500 mg via INTRAVENOUS
  Filled 2016-06-09: qty 1500

## 2016-06-09 MED ORDER — ACETAMINOPHEN 325 MG PO TABS: 650.0000 mg | ORAL_TABLET | Freq: Four times a day (QID) | ORAL | Status: DC | PRN

## 2016-06-09 MED ORDER — OMEGA-3 FATTY ACIDS 1000 MG PO CAPS: 1.0000 g | ORAL_CAPSULE | Freq: Two times a day (BID) | ORAL | Status: DC

## 2016-06-09 MED ORDER — PIPERACILLIN-TAZOBACTAM 3.375 G IVPB 30 MIN
3.3750 g | Freq: Once | INTRAVENOUS | Status: AC
  Administered 2016-06-09: 3.375 g via INTRAVENOUS
  Filled 2016-06-09: qty 50

## 2016-06-09 MED ORDER — OSELTAMIVIR PHOSPHATE 30 MG PO CAPS: 30.0000 mg | ORAL_CAPSULE | ORAL | Status: DC

## 2016-06-09 MED ORDER — ALBUTEROL SULFATE (2.5 MG/3ML) 0.083% IN NEBU
INHALATION_SOLUTION | RESPIRATORY_TRACT | Status: AC
Start: 2016-06-09 — End: 2016-06-10
  Filled 2016-06-09: qty 6

## 2016-06-09 MED ORDER — ONDANSETRON HCL 4 MG/2ML IJ SOLN: 4.0000 mg | Freq: Three times a day (TID) | INTRAMUSCULAR | Status: DC | PRN

## 2016-06-09 MED ORDER — HYDRALAZINE HCL 20 MG/ML IJ SOLN: 5.0000 mg | INTRAMUSCULAR | Status: DC | PRN

## 2016-06-09 MED ORDER — VANCOMYCIN HCL IN DEXTROSE 1-5 GM/200ML-% IV SOLN: 1000.0000 mg | Freq: Once | INTRAVENOUS | Status: DC

## 2016-06-09 MED ORDER — PANTOPRAZOLE SODIUM 40 MG PO TBEC
40.0000 mg | DELAYED_RELEASE_TABLET | Freq: Every day | ORAL | Status: DC
  Administered 2016-06-10 – 2016-06-12 (×3): 40 mg via ORAL
  Filled 2016-06-09 (×3): qty 1

## 2016-06-09 MED ORDER — INSULIN ASPART 100 UNIT/ML ~~LOC~~ SOLN: 0.0000 [IU] | Freq: Every day | SUBCUTANEOUS | Status: DC

## 2016-06-09 MED ORDER — ALBUTEROL SULFATE (2.5 MG/3ML) 0.083% IN NEBU
5.0000 mg | INHALATION_SOLUTION | Freq: Once | RESPIRATORY_TRACT | Status: AC
  Administered 2016-06-09: 5 mg via RESPIRATORY_TRACT

## 2016-06-09 MED ORDER — SODIUM CHLORIDE 0.9 % IV SOLN
INTRAVENOUS | Status: DC
  Administered 2016-06-10 – 2016-06-11 (×3): via INTRAVENOUS

## 2016-06-09 MED ORDER — WARFARIN SODIUM 5 MG PO TABS
5.0000 mg | ORAL_TABLET | ORAL | Status: DC
  Administered 2016-06-11: 5 mg via ORAL

## 2016-06-09 NOTE — H&P (Addendum)
History and Physical    Justin Garner MPN:[id number] DOB: 08-Aug-1932 DOA: 06/09/2016  Referring MD/NP/PA:   PCP: Kandice Hams, MD   Patient coming from:  The patient is coming from home.  At baseline, pt is independent for most of ADL.   Chief Complaint: SOB, cough and chills, mild confusion  HPI: Justin Garner is a 83 y.o. male with medical history significant of hypertension, hyperlipidemia, diabetes mellitus, COPD, GERD, hypothyroidism, PAF and PE on Coumadin, dCHF, CKD-II, lung cancer (s/p of topectomy, radiation and chemotherapy), who presents with SOB, cough and chills, mild confusion.  Per patient's wife, patient has been having chills, cough and SOB for almost a week. He coughs up "pink" mucus. Denies chest pain and fever. Patient has runny nose, but no sore throat. He has mild intermittent confusion, but still is oriented 3 when I talked to him. Pt speaks in full sentence. Patient denies nausea, vomiting, diarrhea, abdominal pain, symptoms of UTI or unilateral weakness.  ED Course: pt was found to have  WBC 13.1, lactate 1.64, INR 2.36, troponin negative, negative urinalysis, stable renal function. Temperature 98.6, tachycardia, tachypnea, oxygen section 90-95% on room air. Chest x-ray showed right perihilar infiltration. Patient is admitted to stepdown as inpatient.  Review of Systems:   General: no fevers, has chills, no changes in body weight, has poor appetite, has fatigue HEENT: no blurry vision, hearing changes or sore throat Respiratory: has dyspnea, coughing, no wheezing CV: no chest pain, no palpitations GI: no nausea, vomiting, abdominal pain, diarrhea, constipation GU: no dysuria, burning on urination, increased urinary frequency, hematuria  Ext: has leg edema Neuro: no unilateral weakness, numbness, or tingling, no vision change or hearing loss Skin: no rash, no skin tear. MSK: No muscle spasm, no deformity, no limitation of range of movement in  spin Heme: No easy bruising.  Travel history: No recent long distant travel.  Allergy: No Known Allergies  Past Medical History:  Diagnosis Date  . ASCVD (arteriosclerotic cardiovascular disease)   . Cancer [facility]) 2009   Upper lobectomy;radiation; chemotherapy  . Chronic systolic CHF (congestive heart failure) ([place])   . CKD (chronic kidney disease), stage II   . COPD (chronic obstructive pulmonary disease) ([place])   . Coronary artery disease   . Diabetes mellitus   . GERD (gastroesophageal reflux disease)   . Hyperlipidemia   . Hypertension   . Leukocytosis 07/12/2015  . PAF (paroxysmal atrial fibrillation) (HCC)    Chronic warfarin therapy  . Primary cancer of right upper lobe of lung ([place]) 06/30/2013   RUL lobectomy 2009 with chest wall resection for superior sulcus tumor Postoperative radiation and chemotherapy; Dr Earlie Server Oncology monitor [phone]  . Pulmonary embolism (HCC)    PMH of  . Thyroid disease     Past Surgical History:  Procedure Laterality Date  . CAROTID STENT    . CHOLECYSTECTOMY    . FIBEROPTIC BRONCHOSCOPY AND MEIASTINOSCOPY  06/07/2007  . HERNIA REPAIR    . INGUINAL HERNIA REPAIR  10/10/2010  . LEFT HEART CATHETERIZATION WITH CORONARY ANGIOGRAM N/A 08/19/2013   Procedure: LEFT HEART CATHETERIZATION WITH CORONARY ANGIOGRAM;  Surgeon: Jettie Booze, MD;  Location: [facility] CATH LAB;  Service: Cardiovascular;  Laterality: N/A;  . R VATS,THORACOTOMY AND UPPER LOBECTOMY  08/02/2007    Social History:  reports that he quit smoking about 15 years ago. His smoking use included Cigarettes. He has a 50.00 pack-year smoking history. He has quit using smokeless tobacco. He reports that he does not drink  alcohol or use drugs.  Family History:  Family History  Problem Relation Age of Onset  . Heart disease Father     Heart attack  . Heart attack Father   . Diabetes Brother   . Cancer Neg Hx   . Stroke Neg Hx   . Hypertension Neg Hx      Prior to Admission  medications   Medication Sig Start Date End Date Taking? Authorizing Provider  albuterol (PROVENTIL HFA;VENTOLIN HFA) 108 (90 Base) MCG/ACT inhaler Inhale 2 puffs into the lungs every 6 (six) hours as needed for wheezing or shortness of breath. 11/16/15   Maryann Mikhail, DO  Amino Acids-Protein Hydrolys (FEEDING SUPPLEMENT, PRO-STAT SUGAR FREE 64,) LIQD Take 30 mLs by mouth 2 (two) times daily with a meal. 11/16/15   Maryann Mikhail, DO  atorvastatin (LIPITOR) 20 MG tablet Take 1 tablet (20 mg total) by mouth daily. 08/06/15   Jettie Booze, MD  azithromycin (ZITHROMAX) 250 MG tablet Take 1 tablet (250 mg total) by mouth daily. 11/16/15   Maryann Mikhail, DO  budesonide-formoterol (SYMBICORT) 80-4.5 MCG/ACT inhaler Inhale 2 puffs into the lungs every morning. 11/16/15   Maryann Mikhail, DO  dextromethorphan-guaiFENesin (MUCINEX DM) 30-600 MG 12hr tablet Take 1 tablet by mouth 2 (two) times daily as needed for cough. 11/16/15   Maryann Mikhail, DO  diltiazem (CARDIZEM CD) 240 MG 24 hr capsule Take 1 capsule (240 mg total) by mouth daily. 08/09/15   Jettie Booze, MD  feeding supplement, ENSURE ENLIVE, (ENSURE ENLIVE) LIQD Take 237 mLs by mouth daily. 11/16/15   Maryann Mikhail, DO  fish oil-omega-3 fatty acids 1000 MG capsule Take 1 g by mouth 2 (two) times daily.     Historical Provider, MD  FREESTYLE LITE test strip  08/09/15   Historical Provider, MD  furosemide (LASIX) 20 MG tablet Take 20 mg by mouth daily.     Historical Provider, MD  glipiZIDE (GLUCOTROL) 10 MG tablet Take 10 mg by mouth daily. 08/09/15   Historical Provider, MD  JANUVIA 50 MG tablet Take 50 mg by mouth every morning.  07/21/11   Historical Provider, MD  levothyroxine (SYNTHROID, LEVOTHROID) 112 MCG tablet Take 112 mcg by mouth daily.      Historical Provider, MD  metoprolol tartrate (LOPRESSOR) 25 MG tablet Take 0.5 tablets (12.5 mg total) by mouth 2 (two) times daily. 08/09/15   Jettie Booze, MD  nitroGLYCERIN  (NITROSTAT) 0.4 MG SL tablet Place 0.4 mg under the tongue every 5 (five) minutes as needed for chest pain (UP TO 3 DOSES BEFORE CALLING EMS). Reported on 09/06/2015    Historical Provider, MD  omeprazole (PRILOSEC) 20 MG capsule Take 20 mg by mouth daily.      Historical Provider, MD  predniSONE (DELTASONE) 10 MG tablet Take '40mg'$  (4 tabs) x 2 days, then taper to '30mg'$  (3 tabs) x 2 days, then '20mg'$  (2 tabs) x 2 days, then '10mg'$  (1 tab) x 2 days, then OFF. 11/16/15   Maryann Mikhail, DO  warfarin (COUMADIN) 5 MG tablet Take as directed by coumadin clinic 03/22/16   Jettie Booze, MD    Physical Exam: Vitals:   06/09/16 2000 06/09/16 2030 06/09/16 2200 06/09/16 2230  BP: (!) 159/109 (!) 150/80 (!) 148/81 131/87  Pulse: (!) 133 (!) 116 (!) 56 (!) 121  Resp:  (!) 31 (!) 29 (!) 35  Temp:      TempSrc:      SpO2: 90% 95% 95% 95%  Weight:  Height:       General: Not in acute distress HEENT:       Eyes: PERRL, EOMI, no scleral icterus.       ENT: No discharge from the ears and nose, no pharynx injection, no tonsillar enlargement.        Neck: No JVD, no bruit, no mass felt. Heme: No neck lymph node enlargement. Cardiac: S1/S2, irregularly ureter rhythm. No murmurs, No gallops or rubs. Respiratory: Has diffuse crackles and rhonchi bilaterally. No rubs. GI: Soft, nondistended, nontender, no rebound pain, no organomegaly, BS present. GU: No hematuria Ext: 1+ pitting leg edema bilaterally. 2+DP/PT pulse bilaterally. Musculoskeletal: No joint deformities, No joint redness or warmth, no limitation of ROM in spin. Skin: No rashes.  Neuro: mildly confused, but still oriented X3, cranial nerves II-XII grossly intact, moves all extremities normally. Marland Kitchen Psych: Patient is not psychotic, no suicidal or hemocidal ideation.  Labs on Admission: I have personally reviewed following labs and imaging studies  CBC:  Recent Labs Lab 06/09/16 1921 06/09/16 1939  WBC 13.1*  --   HGB 15.8 16.7  HCT  46.2 49.0  MCV 89.5  --   PLT 286  --    Basic Metabolic Panel:  Recent Labs Lab 06/09/16 1921 06/09/16 1939  NA 140 144  K 3.7 3.5  CL 104 103  CO2 23  --   GLUCOSE 110* 109*  BUN 19 22*  CREATININE 1.36* 1.20  CALCIUM 8.6*  --    GFR: Estimated Creatinine Clearance: 55.2 mL/min (by C-G formula based on SCr of 1.2 mg/dL). Liver Function Tests:  Recent Labs Lab 06/09/16 1921  AST 41  ALT 28  ALKPHOS 180*  BILITOT 2.0*  PROT 6.4*  ALBUMIN 3.5   No results for input(s): LIPASE, AMYLASE in the last 168 hours. No results for input(s): AMMONIA in the last 168 hours. Coagulation Profile:  Recent Labs Lab 06/06/16 0838 06/09/16 1921  INR 2.4 2.36   Cardiac Enzymes: No results for input(s): CKTOTAL, CKMB, CKMBINDEX, TROPONINI in the last 168 hours. BNP (last 3 results) No results for input(s): PROBNP in the last 8760 hours. HbA1C: No results for input(s): HGBA1C in the last 72 hours. CBG: No results for input(s): GLUCAP in the last 168 hours. Lipid Profile: No results for input(s): CHOL, HDL, LDLCALC, TRIG, CHOLHDL, LDLDIRECT in the last 72 hours. Thyroid Function Tests: No results for input(s): TSH, T4TOTAL, FREET4, T3FREE, THYROIDAB in the last 72 hours. Anemia Panel: No results for input(s): VITAMINB12, FOLATE, FERRITIN, TIBC, IRON, RETICCTPCT in the last 72 hours. Urine analysis:    Component Value Date/Time   COLORURINE YELLOW 06/09/2016 1949   APPEARANCEUR CLEAR 06/09/2016 1949   LABSPEC 1.017 06/09/2016 1949   PHURINE 5.0 06/09/2016 1949   GLUCOSEU NEGATIVE 06/09/2016 1949   HGBUR SMALL (A) 06/09/2016 1949   BILIRUBINUR NEGATIVE 06/09/2016 1949   KETONESUR NEGATIVE 06/09/2016 1949   PROTEINUR 100 (A) 06/09/2016 1949   UROBILINOGEN 4.0 (H) 12/27/2013 2225   NITRITE NEGATIVE 06/09/2016 1949   LEUKOCYTESUR NEGATIVE 06/09/2016 1949   Sepsis Labs: '@LABRCNTIP'$ (procalcitonin:4,lacticidven:4) )No results found for this or any previous visit (from the  past 240 hour(s)).   Radiological Exams on Admission: Dg Chest 2 View  Result Date: 06/09/2016 CLINICAL DATA:  Shortness of breath and coughing. Chills this evening. History of lung cancer, diabetes and congestive heart failure. EXAM: CHEST  2 VIEW COMPARISON:  11/14/2015 and 06/28/2015 radiographs. Chest CT 06/20/2015. FINDINGS: The heart size is stable for the AP technique  of the current frontal examination. There is aortic atherosclerosis. There is increased density in the right perihilar region, demonstrated on both views. Generalized interstitial prominence and chronic blunting of the right costophrenic angle appear unchanged. No acute osseous findings are seen. IMPRESSION: New right perihilar airspace disease in this patient status post right upper lobe resection for lung cancer. Findings are favored to reflect pneumonia. Followup PA and lateral chest X-ray is recommended in 3-4 weeks following trial of antibiotic therapy to ensure resolution and exclude recurrent malignancy. Electronically Signed   By: Richardean Sale M.D.   On: 06/09/2016 20:37     EKG: Independently reviewed.  Atrial fibrillation, QTC 475, PVC, poor R-wave progression   Assessment/Plan Principal Problem:   Acute on chronic respiratory failure with hypoxia (HCC) Active Problems:   COPD with acute bronchitis (HCC)   Pulmonary embolism (HCC)   Coronary atherosclerosis of native coronary artery   Essential hypertension, benign   Sepsis ([place])   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 (HCC)   CAP (community acquired pneumonia)   Hypothyroidism   HLD (hyperlipidemia)   Chronic diastolic CHF (congestive heart failure) (HCC)   Atrial fibrillation with RVR (HCC)   Acute encephalopathy   Acute on chronic respiratory failure with hypoxia due to CAP and COPD exacerbation and sepsis: Patient's cough, SOB, chills and infiltration of right perihilar on chest x-ray, consistent with CAP. He also has bilateral rhonchi on lung auscultation,  indicating COPD exacerbation. Patient meets criteria for sepsis with leukocytosis, tachycardia and tachypnea. Lactate is normal. Currently hemodynamically stable  -will admit to SDU as inpt -check ABG -start BiPAP -Nebulizers: scheduled atrovent nebs and prn Xopenex -Solu-Medrol 60 mg IV bid  -Patient received 1 dose of vanco and zosyn-->will switch to Rocephin and azithromycin for CAP -give one dose of tamiflu, pending flu pcr -Mucinex for cough  -Urine  legionella and S. pneumococcal antigen -Follow up blood culture x2, sputum culture, Flu pcr -will get Procalcitonin and trend lactic acid levels per sepsis protocol. -IVF: 1L of NS bolus in ED, followed by  cc/h   Acute encephalopathy: Patient has intermittent mild confusion, but oriented 3. Most likely due to CAP, COPD exacerbation and sepsis. -Treat underlying issues -Frequent neuro check  Atrial Fibrillation with RVR:  CHA2DS2-VASc Score is 5, needs oral anticoagulation. Patient is on Coumadin at home. INR is 2.36 pending on admission. Heart rate is 110s-130. Pt coughs up "pink" mucus, not sure if this is blood, but not actively having hemoptysis. We'll observe pt closely. If patient develops hemolysis, may need to hold Coumadin. For now, will continue Coumadin -continue metoprolol and Cardizem-->if HR is persistently higher than 120, will start cardizem gtt. -Continue Coumadin per pharmacy  Hx of PE: -on Coumadin as above  HTN:  -Continue home metoprolol, Cardizem -hold lasix due to sepsis -IV hydralazine when necessary  Primary cancer of right upper lobe of lung ([place]): pt has stage II a non-small cell lung cancer, s/p of concurrent chemoradiation followed by surgical resection. He is followed up by Dr. Julien Nordmann, last assay was 6 months ago.  -Follow-up with Dr. Julien Nordmann   DM-II: Last A1c 7.1 on 07/02/15, well controled. Patient is taking  glipizide and Januvia at home -SSI  HLD: Last LDL was 43 on 06/15/15 -Continue  home medications: Lipitor  Hypothyroidism: Last TSH was 1.213 on 11/15/15 -Continue home Synthroid  Chronic kidney failure-II: Slightly worse renal function. Baseline creatinine 1.1-1.3, her creatinine is 1.36, BUN 19 -Follow-up renal function by BMP  Chronic diastolic CHF (congestive heart failure) [facility]): Patient has 1+ leg edema, but no JVD. No pulmonary edema on chest x-ray. CHF seems to be compensated -Continue metoprolol -hold lasix due to sepsis -check BNP  Protein-calorie malnutrition, severe ([place]): -start Ensure when ready to eat   DVT ppx: On coumadin Code Status: Full code (I discussed with pt, he wants to be Full code) Family Communication: Yes, patient's wife at bed side Disposition Plan:  Anticipate discharge back to previous home environment Consults called:  none Admission status:SDU/inpation       Date of Service 06/09/2016    Ivor Costa [facility] Pager [phone]  If 7PM-7AM, please contact night-coverage [email or web address] Password [facility] 06/09/2016, 10:44 PM

## 2016-06-09 NOTE — ED Notes (Signed)
Patient transported to X-ray 

## 2016-06-09 NOTE — ED Triage Notes (Signed)
Pt reports chills that started this evening. Denies chest pain or shortness of breath but is tachypenic in triage and has coarse breath sound. Pt denies chest pain

## 2016-06-09 NOTE — Progress Notes (Signed)
ANTICOAGULATION CONSULT NOTE - Initial Consult  Pharmacy Consult for Coumadin Indication: atrial fibrillation and pulmonary embolus  No Known Allergies  Patient Measurements: Height: '6\' 2"'$  (188 cm) Weight: 195 lb (88.5 kg) IBW/kg (Calculated) : 82.2  Vital Signs: Temp: 102.7 F (39.3 C) (03/23 2055) Temp Source: Oral (03/23 1911) BP: 131/87 (03/23 2230) Pulse Rate: 121 (03/23 2230)  Labs:  Recent Labs  06/09/16 1921 06/09/16 1939  HGB 15.8 16.7  HCT 46.2 49.0  PLT 286  --   LABPROT 26.3*  --   INR 2.36  --   CREATININE 1.36* 1.20    Estimated Creatinine Clearance: 55.2 mL/min (by C-G formula based on SCr of 1.2 mg/dL).   Medical History: Past Medical History:  Diagnosis Date  . ASCVD (arteriosclerotic cardiovascular disease)   . Cancer [facility]) 2009   Upper lobectomy;radiation; chemotherapy  . Chronic systolic CHF (congestive heart failure) ([place])   . CKD (chronic kidney disease), stage II   . COPD (chronic obstructive pulmonary disease) ([place])   . Coronary artery disease   . Diabetes mellitus   . GERD (gastroesophageal reflux disease)   . Hyperlipidemia   . Hypertension   . Leukocytosis 07/12/2015  . PAF (paroxysmal atrial fibrillation) (HCC)    Chronic warfarin therapy  . Primary cancer of right upper lobe of lung ([place]) 06/30/2013   RUL lobectomy 2009 with chest wall resection for superior sulcus tumor Postoperative radiation and chemotherapy; Dr Earlie Server Oncology monitor [phone]  . Pulmonary embolism (HCC)    PMH of  . Thyroid disease     Medications:  Prescriptions Prior to Admission  Medication Sig Dispense Refill Last Dose  . albuterol (PROVENTIL HFA;VENTOLIN HFA) 108 (90 Base) MCG/ACT inhaler Inhale 2 puffs into the lungs every 6 (six) hours as needed for wheezing or shortness of breath. 1 Inhaler 2   . Amino Acids-Protein Hydrolys (FEEDING SUPPLEMENT, PRO-STAT SUGAR FREE 64,) LIQD Take 30 mLs by mouth 2 (two) times daily with a meal. 900 mL 0   .  atorvastatin (LIPITOR) 20 MG tablet Take 1 tablet (20 mg total) by mouth daily. 90 tablet 3 11/14/2015 at Unknown time  . azithromycin (ZITHROMAX) 250 MG tablet Take 1 tablet (250 mg total) by mouth daily. 4 each 0   . budesonide-formoterol (SYMBICORT) 80-4.5 MCG/ACT inhaler Inhale 2 puffs into the lungs every morning. 1 Inhaler 2   . dextromethorphan-guaiFENesin (MUCINEX DM) 30-600 MG 12hr tablet Take 1 tablet by mouth 2 (two) times daily as needed for cough. 14 tablet 0   . diltiazem (CARDIZEM CD) 240 MG 24 hr capsule Take 1 capsule (240 mg total) by mouth daily. 90 capsule 3 11/14/2015 at Unknown time  . feeding supplement, ENSURE ENLIVE, (ENSURE ENLIVE) LIQD Take 237 mLs by mouth daily. 30 Bottle 0   . fish oil-omega-3 fatty acids 1000 MG capsule Take 1 g by mouth 2 (two) times daily.    11/14/2015 at Unknown time  . FREESTYLE LITE test strip   1 Taking  . furosemide (LASIX) 20 MG tablet Take 20 mg by mouth daily.    11/14/2015 at Unknown time  . glipiZIDE (GLUCOTROL) 10 MG tablet Take 10 mg by mouth daily.  3 11/14/2015 at Unknown time  . JANUVIA 50 MG tablet Take 50 mg by mouth every morning.    11/14/2015 at Unknown time  . levothyroxine (SYNTHROID, LEVOTHROID) 112 MCG tablet Take 112 mcg by mouth daily.     11/14/2015 at Unknown time  . metoprolol tartrate (LOPRESSOR) 25 MG  tablet Take 0.5 tablets (12.5 mg total) by mouth 2 (two) times daily. 90 tablet 3 11/14/2015 at 0800  . nitroGLYCERIN (NITROSTAT) 0.4 MG SL tablet Place 0.4 mg under the tongue every 5 (five) minutes as needed for chest pain (UP TO 3 DOSES BEFORE CALLING EMS). Reported on 09/06/2015   unknown  . omeprazole (PRILOSEC) 20 MG capsule Take 20 mg by mouth daily.     11/14/2015 at Unknown time  . predniSONE (DELTASONE) 10 MG tablet Take '40mg'$  (4 tabs) x 2 days, then taper to '30mg'$  (3 tabs) x 2 days, then '20mg'$  (2 tabs) x 2 days, then '10mg'$  (1 tab) x 2 days, then OFF. 20 tablet 0   . warfarin (COUMADIN) 5 MG tablet Take as directed by coumadin  clinic 135 tablet 0    Scheduled:  . albuterol      . [START ON 06/10/2016] atorvastatin  20 mg Oral q1800  . azithromycin  500 mg Intravenous QHS  . [START ON 06/10/2016] cefTRIAXone (ROCEPHIN)  IV  1 g Intravenous Q0600  . [START ON 06/10/2016] diltiazem  240 mg Oral Daily  . [START ON 06/10/2016] feeding supplement (PRO-STAT SUGAR FREE 64)  30 mL Oral BID WC  . insulin aspart  0-5 Units Subcutaneous QHS  . [START ON 06/10/2016] insulin aspart  0-9 Units Subcutaneous TID WC  . [START ON 06/10/2016] ipratropium  0.5 mg Nebulization Q4H  . [START ON 06/10/2016] levalbuterol  1.25 mg Nebulization Q6H  . [START ON 06/10/2016] levothyroxine  112 mcg Oral QAC breakfast  . methylPREDNISolone (SOLU-MEDROL) injection  60 mg Intravenous Q12H  . metoprolol tartrate  12.5 mg Oral BID  . omega-3 acid ethyl esters  1 g Oral BID  . oseltamivir  30 mg Oral NOW  . [START ON 06/10/2016] pantoprazole  40 mg Oral Daily   Infusions:  . sodium chloride      Assessment: 83yo male admitted for acute on chronic respiratory failure, to continue Coumadin for Afib and PE; current INR at goal with last dose taken today.  Goal of Therapy:  INR 2-3   Plan:  Will continue home Coumadin dose of 7.'5mg'$  MTuWFSat and '5mg'$  ThuSun and monitor INR.  Wynona Neat, PharmD, BCPS  06/09/2016,11:37 PM

## 2016-06-09 NOTE — ED Provider Notes (Signed)
[facility] DEPT Provider Note   CSN: [id number] Arrival date & time: 06/09/16  1905     History   Chief Complaint Chief Complaint  Patient presents with  . Chills  . Shortness of Breath    HPI Justin Garner is a 83 y.o. male.  This is an 83 year old male with a history of atrial fibrillation, lung cancer with right lobe resection who presents with one week of coughing and one day of chills, confusion, and shortness of breath.      Past Medical History:  Diagnosis Date  . ASCVD (arteriosclerotic cardiovascular disease)   . Cancer [facility]) 2009   Upper lobectomy;radiation; chemotherapy  . Chronic systolic CHF (congestive heart failure) ([place])   . CKD (chronic kidney disease), stage II   . COPD (chronic obstructive pulmonary disease) ([place])   . Coronary artery disease   . Diabetes mellitus   . GERD (gastroesophageal reflux disease)   . Hyperlipidemia   . Hypertension   . Leukocytosis 07/12/2015  . PAF (paroxysmal atrial fibrillation) (HCC)    Chronic warfarin therapy  . Primary cancer of right upper lobe of lung ([place]) 06/30/2013   RUL lobectomy 2009 with chest wall resection for superior sulcus tumor Postoperative radiation and chemotherapy; Dr Earlie Server Oncology monitor [phone]  . Pulmonary embolism (HCC)    PMH of  . Thyroid disease     Patient Active Problem List   Diagnosis Date Noted  . Pneumonia 06/09/2016  . COPD exacerbation ([place]) 11/15/2015  . Acute on chronic kidney failure-II 11/15/2015  . Malnutrition of moderate degree 11/15/2015  . Leukocytosis 07/12/2015  . Chronic obstructive pulmonary disease ([place])   . Chronic diastolic CHF (congestive heart failure) ([place])   . Cardiomyopathy ([place])   . CAD in native artery   . Hypomagnesemia   . CAP (community acquired pneumonia) 06/13/2015  . Acute on chronic respiratory failure with hypoxia ([place]) 06/13/2015  . Hypothyroidism 06/13/2015  . HLD (hyperlipidemia) 06/13/2015  . Chronic systolic CHF  (congestive heart failure) ([place])   . Protein-calorie malnutrition, severe ([place]) 12/29/2013  . Hypokalemia 12/28/2013  . 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 ([place]) 12/28/2013  . Acute systolic heart failure ([place]) 08/12/2013  . Primary cancer of right upper lobe of lung ([place]) 06/30/2013  . Coronary atherosclerosis of native coronary artery 05/28/2013  . Essential hypertension, benign 05/28/2013  . Chronic atrial fibrillation ([place]) 12/24/2012  . Other pulmonary embolism and infarction 12/24/2012  . COPD (chronic obstructive pulmonary disease) with emphysema ([place]) 04/23/2008    Past Surgical History:  Procedure Laterality Date  . CAROTID STENT    . CHOLECYSTECTOMY    . FIBEROPTIC BRONCHOSCOPY AND MEIASTINOSCOPY  06/07/2007  . HERNIA REPAIR    . INGUINAL HERNIA REPAIR  10/10/2010  . LEFT HEART CATHETERIZATION WITH CORONARY ANGIOGRAM N/A 08/19/2013   Procedure: LEFT HEART CATHETERIZATION WITH CORONARY ANGIOGRAM;  Surgeon: Jettie Booze, MD;  Location: [facility] CATH LAB;  Service: Cardiovascular;  Laterality: N/A;  . R VATS,THORACOTOMY AND UPPER LOBECTOMY  08/02/2007       Home Medications    Prior to Admission medications   Medication Sig Start Date End Date Taking? Authorizing Provider  albuterol (PROVENTIL HFA;VENTOLIN HFA) 108 (90 Base) MCG/ACT inhaler Inhale 2 puffs into the lungs every 6 (six) hours as needed for wheezing or shortness of breath. 11/16/15   Maryann Mikhail, DO  Amino Acids-Protein Hydrolys (FEEDING SUPPLEMENT, PRO-STAT SUGAR FREE 64,) LIQD Take 30 mLs by mouth 2 (two) times daily with a meal. 11/16/15  Maryann Mikhail, DO  atorvastatin (LIPITOR) 20 MG tablet Take 1 tablet (20 mg total) by mouth daily. 08/06/15   Jettie Booze, MD  azithromycin (ZITHROMAX) 250 MG tablet Take 1 tablet (250 mg total) by mouth daily. 11/16/15   Maryann Mikhail, DO  budesonide-formoterol (SYMBICORT) 80-4.5 MCG/ACT inhaler Inhale 2 puffs into the lungs every morning. 11/16/15   Maryann Mikhail,  DO  dextromethorphan-guaiFENesin (MUCINEX DM) 30-600 MG 12hr tablet Take 1 tablet by mouth 2 (two) times daily as needed for cough. 11/16/15   Maryann Mikhail, DO  diltiazem (CARDIZEM CD) 240 MG 24 hr capsule Take 1 capsule (240 mg total) by mouth daily. 08/09/15   Jettie Booze, MD  feeding supplement, ENSURE ENLIVE, (ENSURE ENLIVE) LIQD Take 237 mLs by mouth daily. 11/16/15   Maryann Mikhail, DO  fish oil-omega-3 fatty acids 1000 MG capsule Take 1 g by mouth 2 (two) times daily.     Historical Provider, MD  FREESTYLE LITE test strip  08/09/15   Historical Provider, MD  furosemide (LASIX) 20 MG tablet Take 20 mg by mouth daily.     Historical Provider, MD  glipiZIDE (GLUCOTROL) 10 MG tablet Take 10 mg by mouth daily. 08/09/15   Historical Provider, MD  JANUVIA 50 MG tablet Take 50 mg by mouth every morning.  07/21/11   Historical Provider, MD  levothyroxine (SYNTHROID, LEVOTHROID) 112 MCG tablet Take 112 mcg by mouth daily.      Historical Provider, MD  metoprolol tartrate (LOPRESSOR) 25 MG tablet Take 0.5 tablets (12.5 mg total) by mouth 2 (two) times daily. 08/09/15   Jettie Booze, MD  nitroGLYCERIN (NITROSTAT) 0.4 MG SL tablet Place 0.4 mg under the tongue every 5 (five) minutes as needed for chest pain (UP TO 3 DOSES BEFORE CALLING EMS). Reported on 09/06/2015    Historical Provider, MD  omeprazole (PRILOSEC) 20 MG capsule Take 20 mg by mouth daily.      Historical Provider, MD  predniSONE (DELTASONE) 10 MG tablet Take '40mg'$  (4 tabs) x 2 days, then taper to '30mg'$  (3 tabs) x 2 days, then '20mg'$  (2 tabs) x 2 days, then '10mg'$  (1 tab) x 2 days, then OFF. 11/16/15   Maryann Mikhail, DO  warfarin (COUMADIN) 5 MG tablet Take as directed by coumadin clinic 03/22/16   Jettie Booze, MD    Family History Family History  Problem Relation Age of Onset  . Heart disease Father     Heart attack  . Heart attack Father   . Diabetes Brother   . Cancer Neg Hx   . Stroke Neg Hx   . Hypertension Neg Hx       Social History Social History  Substance Use Topics  . Smoking status: Former Smoker    Packs/day: 1.00    Years: 50.00    Types: Cigarettes    Quit date: 03/20/2001  . Smokeless tobacco: Former Systems developer  . Alcohol use No     Allergies   Patient has no known allergies.   Review of Systems Review of Systems  Constitutional: Positive for activity change and chills.  Respiratory: Positive for cough and shortness of breath.   Cardiovascular: Negative for chest pain and leg swelling.  Skin: Negative for rash and wound.  Neurological: Negative for speech difficulty.  Psychiatric/Behavioral: Positive for confusion.     Physical Exam Updated Vital Signs BP (!) 150/80   Pulse (!) 116   Temp 98.6 F (37 C) (Oral)   Resp (!) 31  Ht '6\' 2"'$  (1.88 m)   Wt 88.5 kg   SpO2 95%   BMI 25.04 kg/m   Physical Exam  Constitutional: He appears well-developed and well-nourished.  HENT:  Head: Normocephalic.  Mouth/Throat: Oropharynx is clear and moist.  Eyes: Pupils are equal, round, and reactive to light.  Cardiovascular: Normal heart sounds.  Tachycardia present.   Pulmonary/Chest: Tachypnea noted. No respiratory distress. He has rales.  Abdominal: Soft.  Musculoskeletal: Normal range of motion.  Neurological: He is alert.  Skin: Skin is warm. He is diaphoretic. There is pallor.  Nursing note and vitals reviewed.    ED Treatments / Results  Labs (all labs ordered are listed, but only abnormal results are displayed) Labs Reviewed  CBC - Abnormal; Notable for the following:       Result Value   WBC 13.1 (*)    RDW 15.6 (*)    All other components within normal limits  URINALYSIS, ROUTINE W REFLEX MICROSCOPIC - Abnormal; Notable for the following:    Hgb urine dipstick SMALL (*)    Protein, ur 100 (*)    All other components within normal limits  PROTIME-INR - Abnormal; Notable for the following:    Prothrombin Time 26.3 (*)    All other components within normal limits   COMPREHENSIVE METABOLIC PANEL - Abnormal; Notable for the following:    Glucose, Bld 110 (*)    Creatinine, Ser 1.36 (*)    Calcium 8.6 (*)    Total Protein 6.4 (*)    Alkaline Phosphatase 180 (*)    Total Bilirubin 2.0 (*)    GFR calc non Af Amer 47 (*)    GFR calc Af Amer 54 (*)    All other components within normal limits  I-STAT CHEM 8, ED - Abnormal; Notable for the following:    BUN 22 (*)    Glucose, Bld 109 (*)    Calcium, Ion 1.04 (*)    All other components within normal limits  CULTURE, BLOOD (ROUTINE X 2)  CULTURE, BLOOD (ROUTINE X 2)  I-STAT TROPOININ, ED  I-STAT CG4 LACTIC ACID, ED  I-STAT TROPOININ, ED  I-STAT CG4 LACTIC ACID, ED  POC OCCULT BLOOD, ED    EKG  EKG Interpretation  Date/Time:  Friday June 09 2016 21:41:27 EDT Ventricular Rate:  105 PR Interval:    QRS Duration: 114 QT Interval:  359 QTC Calculation: 475 R Axis:   83 Text Interpretation:  Atrial fibrillation Borderline low voltage, extremity leads Probable anteroseptal infarct, old No significant change since last tracing Confirmed by Canary Brim  MD, MARTHA [phone]) on 06/09/2016 9:45:10 PM       Radiology Dg Chest 2 View  Result Date: 06/09/2016 CLINICAL DATA:  Shortness of breath and coughing. Chills this evening. History of lung cancer, diabetes and congestive heart failure. EXAM: CHEST  2 VIEW COMPARISON:  11/14/2015 and 06/28/2015 radiographs. Chest CT 06/20/2015. FINDINGS: The heart size is stable for the AP technique of the current frontal examination. There is aortic atherosclerosis. There is increased density in the right perihilar region, demonstrated on both views. Generalized interstitial prominence and chronic blunting of the right costophrenic angle appear unchanged. No acute osseous findings are seen. IMPRESSION: New right perihilar airspace disease in this patient status post right upper lobe resection for lung cancer. Findings are favored to reflect pneumonia. Followup PA and lateral  chest X-ray is recommended in 3-4 weeks following trial of antibiotic therapy to ensure resolution and exclude recurrent malignancy. Electronically Signed  By: Richardean Sale M.D.   On: 06/09/2016 20:37    Procedures .Critical Care Performed by: Junius Creamer Authorized by: Junius Creamer   Critical care provider statement:    Critical care time (minutes):  45   Critical care start time:  06/09/2016 8:30 PM   Critical care end time:  06/09/2016 10:19 PM   Critical care was necessary to treat or prevent imminent or life-threatening deterioration of the following conditions:  Sepsis   Critical care was time spent personally by me on the following activities:  Development of treatment plan with patient or surrogate, evaluation of patient's response to treatment, examination of patient, obtaining history from patient or surrogate, ordering and performing treatments and interventions, ordering and review of laboratory studies, ordering and review of radiographic studies, pulse oximetry and re-evaluation of patient's condition   I assumed direction of critical care for this patient from another provider in my specialty: yes     (including critical care time)  Medications Ordered in ED Medications  albuterol (PROVENTIL) (2.5 MG/3ML) 0.083% nebulizer solution (not administered)  vancomycin (VANCOCIN) 1,500 mg in sodium chloride 0.9 % 500 mL IVPB (1,500 mg Intravenous New Bag/Given 06/09/16 2122)  0.9 %  sodium chloride infusion (not administered)  albuterol (PROVENTIL) (2.5 MG/3ML) 0.083% nebulizer solution 5 mg (5 mg Nebulization Given 06/09/16 1921)  ipratropium-albuterol (DUONEB) 0.5-2.5 (3) MG/3ML nebulizer solution 3 mL (3 mLs Nebulization Given 06/09/16 2126)  sodium chloride 0.9 % bolus 1,000 mL (1,000 mLs Intravenous New Bag/Given 06/09/16 2103)  piperacillin-tazobactam (ZOSYN) IVPB 3.375 g (0 g Intravenous Stopped 06/09/16 2138)     Initial Impression / Assessment and Plan / ED Course  I have  reviewed the triage vital signs and the nursing notes.  Pertinent labs & imaging results that were available during my care of the patient were reviewed by me and considered in my medical decision making (see chart for details).    Code sepsis activated.  White count elevated at 13.1 confirmed a pneumonia on chest x-ray.  Antibiotics have been started as well as fluid resuscitation lactic acid is 1.4 on 1 L of fluid has been ordered, plus fluid at 150 per hour.     Final Clinical Impressions(s) / ED Diagnoses   Final diagnoses:  Community acquired pneumonia of right lung, unspecified part of lung  Sepsis, due to unspecified organism [facility])    New Prescriptions New Prescriptions   No medications on file     Junius Creamer, NP 06/09/16 [place], MD 06/09/16 [phone]

## 2016-06-09 NOTE — ED Notes (Signed)
Admitting Provider at bedside. 

## 2016-06-09 NOTE — ED Notes (Signed)
No lab draw,  Pt enoute to inpatient floor.

## 2016-06-10 DIAGNOSIS — J9621 Acute and chronic respiratory failure with hypoxia: Secondary | ICD-10-CM

## 2016-06-10 DIAGNOSIS — G9340 Encephalopathy, unspecified: Secondary | ICD-10-CM

## 2016-06-10 DIAGNOSIS — I5032 Chronic diastolic (congestive) heart failure: Secondary | ICD-10-CM

## 2016-06-10 DIAGNOSIS — J189 Pneumonia, unspecified organism: Secondary | ICD-10-CM

## 2016-06-10 DIAGNOSIS — I4891 Unspecified atrial fibrillation: Secondary | ICD-10-CM

## 2016-06-10 LAB — STREP PNEUMONIAE URINARY ANTIGEN: STREP PNEUMO URINARY ANTIGEN: NEGATIVE

## 2016-06-10 LAB — BLOOD GAS, ARTERIAL
ACID-BASE EXCESS: 0.5 mmol/L (ref 0.0–2.0)
Bicarbonate: 23.5 mmol/L (ref 20.0–28.0)
Drawn by: 225631
O2 Content: 3 L/min
O2 SAT: 94.2 %
PCO2 ART: 30.9 mmHg — AB (ref 32.0–48.0)
PO2 ART: 70.9 mmHg — AB (ref 83.0–108.0)
Patient temperature: 98.6
pH, Arterial: 7.493 — ABNORMAL HIGH (ref 7.350–7.450)

## 2016-06-10 LAB — BASIC METABOLIC PANEL
ANION GAP: 13 (ref 5–15)
BUN: 15 mg/dL (ref 6–20)
CHLORIDE: 102 mmol/L (ref 101–111)
CO2: 24 mmol/L (ref 22–32)
Calcium: 8.1 mg/dL — ABNORMAL LOW (ref 8.9–10.3)
Creatinine, Ser: 1.2 mg/dL (ref 0.61–1.24)
GFR calc Af Amer: >60 mL/min (ref >60)
GFR, EST NON AFRICAN AMERICAN: 54 mL/min — AB (ref >60)
Glucose, Bld: 268 mg/dL — ABNORMAL HIGH (ref 65–99)
POTASSIUM: 3.2 mmol/L — AB (ref 3.5–5.1)
Sodium: 139 mmol/L (ref 135–145)

## 2016-06-10 LAB — MAGNESIUM: Magnesium: 1.1 mg/dL — ABNORMAL LOW (ref 1.7–2.4)

## 2016-06-10 LAB — GLUCOSE, CAPILLARY
GLUCOSE-CAPILLARY: 287 mg/dL — AB (ref 65–99)
Glucose-Capillary: 114 mg/dL — ABNORMAL HIGH (ref 65–99)
Glucose-Capillary: 205 mg/dL — ABNORMAL HIGH (ref 65–99)
Glucose-Capillary: 240 mg/dL — ABNORMAL HIGH (ref 65–99)
Glucose-Capillary: 316 mg/dL — ABNORMAL HIGH (ref 65–99)

## 2016-06-10 LAB — LACTIC ACID, PLASMA
Lactic Acid, Venous: 1.8 mmol/L (ref 0.5–1.9)
Lactic Acid, Venous: 1.3 mmol/L (ref 0.5–1.9)

## 2016-06-10 LAB — INFLUENZA PANEL BY PCR (TYPE A & B)
INFLAPCR: NEGATIVE
INFLBPCR: NEGATIVE

## 2016-06-10 LAB — PROTIME-INR
INR: 2.36
PROTHROMBIN TIME: 26.2 s — AB (ref 11.4–15.2)

## 2016-06-10 LAB — MRSA PCR SCREENING: MRSA by PCR: NEGATIVE

## 2016-06-10 LAB — PROCALCITONIN: PROCALCITONIN: 0.37 ng/mL

## 2016-06-10 LAB — BRAIN NATRIURETIC PEPTIDE: B Natriuretic Peptide: 249.8 pg/mL — ABNORMAL HIGH (ref 0.0–100.0)

## 2016-06-10 MED ORDER — GLUCERNA SHAKE PO LIQD
237.0000 mL | Freq: Two times a day (BID) | ORAL | Status: DC
  Administered 2016-06-10 – 2016-06-12 (×4): 237 mL via ORAL
  Filled 2016-06-10: qty 237

## 2016-06-10 MED ORDER — IPRATROPIUM BROMIDE 0.02 % IN SOLN
0.5000 mg | Freq: Four times a day (QID) | RESPIRATORY_TRACT | Status: DC
  Administered 2016-06-10 – 2016-06-12 (×9): 0.5 mg via RESPIRATORY_TRACT
  Filled 2016-06-10 (×9): qty 2.5

## 2016-06-10 MED ORDER — PREDNISONE 20 MG PO TABS
40.0000 mg | ORAL_TABLET | Freq: Every day | ORAL | Status: DC
  Administered 2016-06-10 – 2016-06-12 (×3): 40 mg via ORAL
  Filled 2016-06-10 (×3): qty 2

## 2016-06-10 MED ORDER — INSULIN ASPART 100 UNIT/ML ~~LOC~~ SOLN
0.0000 [IU] | Freq: Three times a day (TID) | SUBCUTANEOUS | Status: DC
  Administered 2016-06-10 – 2016-06-11 (×2): 11 [IU] via SUBCUTANEOUS
  Administered 2016-06-11: 4 [IU] via SUBCUTANEOUS
  Administered 2016-06-11 – 2016-06-12 (×2): 7 [IU] via SUBCUTANEOUS

## 2016-06-10 MED ORDER — INSULIN ASPART 100 UNIT/ML ~~LOC~~ SOLN
0.0000 [IU] | Freq: Every day | SUBCUTANEOUS | Status: DC
  Administered 2016-06-10 – 2016-06-11 (×2): 2 [IU] via SUBCUTANEOUS

## 2016-06-10 MED ORDER — METOPROLOL TARTRATE 25 MG PO TABS
25.0000 mg | ORAL_TABLET | Freq: Two times a day (BID) | ORAL | Status: DC
  Administered 2016-06-10 – 2016-06-12 (×5): 25 mg via ORAL
  Filled 2016-06-10 (×5): qty 1

## 2016-06-10 MED ORDER — METOPROLOL TARTRATE 25 MG PO TABS: 25.0000 mg | ORAL_TABLET | Freq: Two times a day (BID) | ORAL | Status: DC

## 2016-06-10 MED ORDER — LEVALBUTEROL HCL 1.25 MG/0.5ML IN NEBU
1.2500 mg | INHALATION_SOLUTION | Freq: Four times a day (QID) | RESPIRATORY_TRACT | Status: DC
  Administered 2016-06-10 – 2016-06-12 (×9): 1.25 mg via RESPIRATORY_TRACT
  Filled 2016-06-10 (×9): qty 0.5

## 2016-06-10 MED ORDER — DILTIAZEM HCL 25 MG/5ML IV SOLN
10.0000 mg | INTRAVENOUS | Status: DC | PRN
  Administered 2016-06-11: 10 mg via INTRAVENOUS
  Filled 2016-06-10 (×3): qty 5

## 2016-06-10 NOTE — Progress Notes (Addendum)
ANTICOAGULATION CONSULT NOTE - Follow-up Consult  Pharmacy Consult for Coumadin Indication: atrial fibrillation and pulmonary embolus  No Known Allergies  Patient Measurements: Height: '6\' 2"'$  (188 cm) Weight: 189 lb 13.1 oz (86.1 kg) IBW/kg (Calculated) : 82.2  Vital Signs: Temp: 97.7 F (36.5 C) (03/24 0743) Temp Source: Oral (03/24 0743) BP: 125/101 (03/24 0743) Pulse Rate: 104 (03/24 0743)  Labs:  Recent Labs  06/09/16 1921 06/09/16 1939 06/10/16 0226  HGB 15.8 16.7  --   HCT 46.2 49.0  --   PLT 286  --   --   LABPROT 26.3*  --  26.2*  INR 2.36  --  2.36  CREATININE 1.36* 1.20  --     Estimated Creatinine Clearance: 55.2 mL/min (by C-G formula based on SCr of 1.2 mg/dL).   Medical History: Past Medical History:  Diagnosis Date  . ASCVD (arteriosclerotic cardiovascular disease)   . Cancer [facility]) 2009   Upper lobectomy;radiation; chemotherapy  . Chronic systolic CHF (congestive heart failure) ([place])   . CKD (chronic kidney disease), stage II   . COPD (chronic obstructive pulmonary disease) ([place])   . Coronary artery disease   . Diabetes mellitus   . GERD (gastroesophageal reflux disease)   . Hyperlipidemia   . Hypertension   . Leukocytosis 07/12/2015  . PAF (paroxysmal atrial fibrillation) (HCC)    Chronic warfarin therapy  . Primary cancer of right upper lobe of lung ([place]) 06/30/2013   RUL lobectomy 2009 with chest wall resection for superior sulcus tumor Postoperative radiation and chemotherapy; Dr Earlie Server Oncology monitor [phone]  . Pulmonary embolism (HCC)    PMH of  . Thyroid disease     Medications:  Prescriptions Prior to Admission  Medication Sig Dispense Refill Last Dose  . albuterol (PROVENTIL HFA;VENTOLIN HFA) 108 (90 Base) MCG/ACT inhaler Inhale 2 puffs into the lungs every 6 (six) hours as needed for wheezing or shortness of breath. 1 Inhaler 2   . Amino Acids-Protein Hydrolys (FEEDING SUPPLEMENT, PRO-STAT SUGAR FREE 64,) LIQD Take 30 mLs by  mouth 2 (two) times daily with a meal. 900 mL 0   . atorvastatin (LIPITOR) 20 MG tablet Take 1 tablet (20 mg total) by mouth daily. 90 tablet 3 11/14/2015 at Unknown time  . azithromycin (ZITHROMAX) 250 MG tablet Take 1 tablet (250 mg total) by mouth daily. 4 each 0   . budesonide-formoterol (SYMBICORT) 80-4.5 MCG/ACT inhaler Inhale 2 puffs into the lungs every morning. 1 Inhaler 2   . dextromethorphan-guaiFENesin (MUCINEX DM) 30-600 MG 12hr tablet Take 1 tablet by mouth 2 (two) times daily as needed for cough. 14 tablet 0   . diltiazem (CARDIZEM CD) 240 MG 24 hr capsule Take 1 capsule (240 mg total) by mouth daily. 90 capsule 3 11/14/2015 at Unknown time  . feeding supplement, ENSURE ENLIVE, (ENSURE ENLIVE) LIQD Take 237 mLs by mouth daily. 30 Bottle 0   . fish oil-omega-3 fatty acids 1000 MG capsule Take 1 g by mouth 2 (two) times daily.    11/14/2015 at Unknown time  . FREESTYLE LITE test strip   1 Taking  . furosemide (LASIX) 20 MG tablet Take 20 mg by mouth daily.    11/14/2015 at Unknown time  . glipiZIDE (GLUCOTROL) 10 MG tablet Take 10 mg by mouth daily.  3 11/14/2015 at Unknown time  . JANUVIA 50 MG tablet Take 50 mg by mouth every morning.    11/14/2015 at Unknown time  . levothyroxine (SYNTHROID, LEVOTHROID) 112 MCG tablet Take 112 mcg  by mouth daily.     11/14/2015 at Unknown time  . metoprolol tartrate (LOPRESSOR) 25 MG tablet Take 0.5 tablets (12.5 mg total) by mouth 2 (two) times daily. 90 tablet 3 11/14/2015 at 0800  . nitroGLYCERIN (NITROSTAT) 0.4 MG SL tablet Place 0.4 mg under the tongue every 5 (five) minutes as needed for chest pain (UP TO 3 DOSES BEFORE CALLING EMS). Reported on 09/06/2015   unknown  . omeprazole (PRILOSEC) 20 MG capsule Take 20 mg by mouth daily.     11/14/2015 at Unknown time  . predniSONE (DELTASONE) 10 MG tablet Take '40mg'$  (4 tabs) x 2 days, then taper to '30mg'$  (3 tabs) x 2 days, then '20mg'$  (2 tabs) x 2 days, then '10mg'$  (1 tab) x 2 days, then OFF. 20 tablet 0   .  warfarin (COUMADIN) 5 MG tablet Take as directed by coumadin clinic 135 tablet 0    Scheduled:  . atorvastatin  20 mg Oral q1800  . azithromycin  500 mg Intravenous QHS  . cefTRIAXone (ROCEPHIN)  IV  1 g Intravenous Q0600  . diltiazem  240 mg Oral Daily  . feeding supplement (PRO-STAT SUGAR FREE 64)  30 mL Oral BID WC  . insulin aspart  0-5 Units Subcutaneous QHS  . insulin aspart  0-9 Units Subcutaneous TID WC  . ipratropium  0.5 mg Nebulization Q6H  . levalbuterol  1.25 mg Nebulization Q6H  . levothyroxine  112 mcg Oral QAC breakfast  . methylPREDNISolone (SOLU-MEDROL) injection  60 mg Intravenous Q12H  . metoprolol tartrate  25 mg Oral BID  . omega-3 acid ethyl esters  1 g Oral BID  . oseltamivir  30 mg Oral NOW  . pantoprazole  40 mg Oral Daily  . [START ON 06/11/2016] warfarin  5 mg Oral Once per day on Sun Thu  . warfarin  7.5 mg Oral Once per day on Mon Tue Wed Fri Sat  . Warfarin - Pharmacist Dosing Inpatient   Does not apply q1800   Infusions:  . sodium chloride 75 mL/hr at 06/10/16 3200    Assessment: 83 yo male admitted for acute on chronic respiratory failure, to continue Coumadin for  Afib and PE; current INR at goal with last dose taken 3/23. Per notes, pt admitted with "pink mucus" however, MD documentation states not having active hemoptysis. INR this AM therapeutic at 2.36. CBC stable (Hgb 16.7). No overt s/s bleeding per notes.   Goal of Therapy:  INR 2-3   Plan:  Continue home Coumadin dose of 7.'5mg'$  MTuWFSat and '5mg'$  ThuSun  Daily INR and CBC PRN Monitor for s/s bleeding   Argie Ramming, PharmD Pharmacy Resident  Pager [phone] 06/10/16 7:55 AM

## 2016-06-10 NOTE — Progress Notes (Signed)
Pt is resting comfortably on 2L.  Pt states that he cannot cough when the BIPAP is on.  BiPAP will remain on standy-by this evening.

## 2016-06-10 NOTE — Progress Notes (Signed)
PROGRESS NOTE  Justin Garner  QRF:[id number] DOB: 01/26/1933 DOA: 06/09/2016 PCP: Justin Hams, MD   Brief Narrative: Justin Garner is a 83 y.o. male with a history of hypertension, hyperlipidemia, diabetes mellitus, COPD, GERD, hypothyroidism, PAF and PE on Coumadin, dCHF, CKD-II, lung cancer (s/p lobectomy, radiation and chemotherapy) brought by his wife for chills, cough and dyspnea  associated with wheezing, worsening constantly over the past week.   On arrival Tmax in ED was 102.23F, tachypneic to 38, and in borderline rapid AFib (HR 95 - 121). WBC 13.1, lactate 1.64, INR 2.36, troponin negative, negative urinalysis, stable renal function. CXR showed perihilar infiltrate. He was admitted to SDU for CAP and concomitant COPD exacerbation. Though there was no documented hypoxemia, nursing staff state he became hypoxic overnight with increased WOB necessitating BiPAP for several hours. He was taken off early this morning and has been stable on 2.5L supplemental oxygen. HR stable in 100's.  Assessment & Plan: Principal Problem:   Acute on chronic respiratory failure with hypoxia (HCC) Active Problems:   COPD with acute bronchitis (HCC)   Pulmonary embolism (HCC)   Coronary atherosclerosis of native coronary artery   Essential hypertension, benign   Sepsis ([place])   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 ([place])   CAP (community acquired pneumonia)   Hypothyroidism   HLD (hyperlipidemia)   Chronic diastolic CHF (congestive heart failure) (HCC)   Atrial fibrillation with RVR (HCC)   Acute encephalopathy  Acute on chronic respiratory failure with hypoxia due to CAP and COPD exacerbation and sepsis: Patient'scough, SOB, chills and infiltration of right perihilar on chest x-ray, consistent with CAP. Very coarse breath sounds including end-expiratory wheezing consistent with COPD exacerbation. Patient meets criteria for sepsis with leukocytosis, tachycardia and tachypnea. Lactate is normal.  Currently hemodynamically stable. Flu negative, DC tamiflu.  - Continue oxygen to maintain SpO2 88-92%.  - Keep in SDU until no BiPAP for 24 hours.  - Nebulizers: scheduled atrovent nebs and prn Xopenex (AFib) - Solu-Medrol 60 mg IV BID > will transition to prednisone.  - Continue CTX, azithromycin (received vanc/zosyn x1 in ED) - Mucinex for cough  - Urine legionella and S. pneumococcal antigen - Follow up blood culture x2, sputum culture - PCT modest at 0.37. Trend in 48 hrs.   Acute encephalopathy: Patient has intermittent mild confusion, but oriented 3. Most likely due to CAP, COPD exacerbation and sepsis. Improving.  - Treat underlying issues, monitor - Delirium precautions  Atrial Fibrillation with RVR: Rate borderline this morning. Asymptomatic.  - CHA2DS2-VASc Scoreis 5, on coumadin. INR therapeutic at admission. - Increase metoprolol and keep diltiazem '240mg'$  po to try to control rate without gtt. Cardizem '10mg'$  IV q4h prn HR > 110bpm - Check K and Mg, replete as indicated.   Hx of PE: - on coumadin as above  HTN:  - Continue home metoprolol, Cardizem - hold lasix due to sepsis - IV hydralazine prn  Stage IIA NSCLC of right upper lobe:s/p chemo, resection. He is followed up by Dr. Julien Nordmann - Dr. Julien Nordmann added to treatment team as FYI   T2DM:Last A1c 7.1 on 07/02/15, well controled. Hold glipizide and januvia - CBG w/SSI qAC/HS  IYM:EBRA LDL was 43 on 06/15/15 - Continue home lipitor  Hypothyroidism: Last TSH was 1.213 on 11/15/15 - Continue home synthroid  Stage II CKD: Baseline creatinine 1.1-1.3; Cr 1.39 > 1.2.  - Follow-up renal function by BMP - Avoid nephrotoxins  Chronic diastolic XEN:MMHWKGS has 1+ leg edema, but no  JVD. No pulmonary edema on chest x-ray. CHF seems to be compensated. BNP 249, equivocal.  - Continue metoprolol - Once sepsis physiology resolves, will restart lasix. Likely 3/25  Protein-calorie malnutrition, severe ([place]): -  Nutrition consult  DVT prophylaxis: Coumadin Code Status: Full Family Communication: Wife at bedside Disposition Plan: Anticipate DC to home environment, transfer to SDU in AM if stable.   Consultants:   None  Procedures:   BiPAP 3/23 > 3/24  Antimicrobials:  Vancomycin 3/23  Cefepime 3/23  Azithromycin 3/23 >>   Ceftriaxone 3/23 >>    Subjective: Pt feeling better, still much more dyspneic than baseline. Wants to eat very badly. Wife says mental status has improved. Off BiPAP for ~2 hours at time of exam.   Objective: Vitals:   06/10/16 0405 06/10/16 0743 06/10/16 0810 06/10/16 0831  BP: (!) 136/93 (!) 125/101    Pulse: (!) 106 (!) 104    Resp: (!) 29 (!) 24    Temp: 98 F (36.7 C) 97.7 F (36.5 C)  98 F (36.7 C)  TempSrc: Oral Oral  Axillary  SpO2: 100% 97% 97%   Weight:      Height:        Intake/Output Summary (Last 24 hours) at 06/10/16 7169 Last data filed at 06/10/16 0800  Gross per 24 hour  Intake           771.25 ml  Output              525 ml  Net           246.25 ml   Filed Weights   06/09/16 1912 06/10/16 0024  Weight: 88.5 kg (195 lb) 86.1 kg (189 lb 13.1 oz)    Examination: General exam: Pleasant elderly male in no distress Respiratory system: Accessory muscle use on 2L by [place]. Diffuse rhonchi and scant wheezing without prolongation of expiration  Cardiovascular system: Irreg irreg ~100bpm, up to 120 w/exertion. No murmur, rub, or gallop. No JVD, and no pedal edema. Gastrointestinal system: Abdomen soft, non-tender, non-distended, with normoactive bowel sounds. No organomegaly or masses felt. Central nervous system: Alert and oriented. No focal neurological deficits. Extremities: Warm, no deformities Skin: Large right upper back surgical scar.  Psychiatry: Judgement and insight appear intact. Mood & affect appropriate.   Data Reviewed: I have personally reviewed following labs and imaging studies  CBC:  Recent Labs Lab  06/09/16 1921 06/09/16 1939  WBC 13.1*  --   HGB 15.8 16.7  HCT 46.2 49.0  MCV 89.5  --   PLT 286  --    Basic Metabolic Panel:  Recent Labs Lab 06/09/16 1921 06/09/16 1939  NA 140 144  K 3.7 3.5  CL 104 103  CO2 23  --   GLUCOSE 110* 109*  BUN 19 22*  CREATININE 1.36* 1.20  CALCIUM 8.6*  --    GFR: Estimated Creatinine Clearance: 55.2 mL/min (by C-G formula based on SCr of 1.2 mg/dL). Liver Function Tests:  Recent Labs Lab 06/09/16 1921  AST 41  ALT 28  ALKPHOS 180*  BILITOT 2.0*  PROT 6.4*  ALBUMIN 3.5   No results for input(s): LIPASE, AMYLASE in the last 168 hours. No results for input(s): AMMONIA in the last 168 hours. Coagulation Profile:  Recent Labs Lab 06/06/16 0838 06/09/16 1921 06/10/16 0226  INR 2.4 2.36 2.36   Cardiac Enzymes: No results for input(s): CKTOTAL, CKMB, CKMBINDEX, TROPONINI in the last 168 hours. BNP (last 3 results) No results for input(s):  PROBNP in the last 8760 hours. HbA1C: No results for input(s): HGBA1C in the last 72 hours. CBG:  Recent Labs Lab 06/10/16 0021 06/10/16 0905  GLUCAP 114* 205*   Lipid Profile: No results for input(s): CHOL, HDL, LDLCALC, TRIG, CHOLHDL, LDLDIRECT in the last 72 hours. Thyroid Function Tests: No results for input(s): TSH, T4TOTAL, FREET4, T3FREE, THYROIDAB in the last 72 hours. Anemia Panel: No results for input(s): VITAMINB12, FOLATE, FERRITIN, TIBC, IRON, RETICCTPCT in the last 72 hours. Urine analysis:    Component Value Date/Time   COLORURINE YELLOW 06/09/2016 1949   APPEARANCEUR CLEAR 06/09/2016 1949   LABSPEC 1.017 06/09/2016 1949   PHURINE 5.0 06/09/2016 1949   GLUCOSEU NEGATIVE 06/09/2016 1949   HGBUR SMALL (A) 06/09/2016 1949   BILIRUBINUR NEGATIVE 06/09/2016 1949   KETONESUR NEGATIVE 06/09/2016 1949   PROTEINUR 100 (A) 06/09/2016 1949   UROBILINOGEN 4.0 (H) 12/27/2013 2225   NITRITE NEGATIVE 06/09/2016 1949   LEUKOCYTESUR NEGATIVE 06/09/2016 1949   Recent  Results (from the past 240 hour(s))  MRSA PCR Screening     Status: None   Collection Time: 06/09/16 11:55 PM  Result Value Ref Range Status   MRSA by PCR NEGATIVE NEGATIVE Final    Comment:        The GeneXpert MRSA Assay (FDA approved for NASAL specimens only), is one component of a comprehensive MRSA colonization surveillance program. It is not intended to diagnose MRSA infection nor to guide or monitor treatment for MRSA infections.       Radiology Studies: Dg Chest 2 View  Result Date: 06/09/2016 CLINICAL DATA:  Shortness of breath and coughing. Chills this evening. History of lung cancer, diabetes and congestive heart failure. EXAM: CHEST  2 VIEW COMPARISON:  11/14/2015 and 06/28/2015 radiographs. Chest CT 06/20/2015. FINDINGS: The heart size is stable for the AP technique of the current frontal examination. There is aortic atherosclerosis. There is increased density in the right perihilar region, demonstrated on both views. Generalized interstitial prominence and chronic blunting of the right costophrenic angle appear unchanged. No acute osseous findings are seen. IMPRESSION: New right perihilar airspace disease in this patient status post right upper lobe resection for lung cancer. Findings are favored to reflect pneumonia. Followup PA and lateral chest X-ray is recommended in 3-4 weeks following trial of antibiotic therapy to ensure resolution and exclude recurrent malignancy. Electronically Signed   By: Richardean Sale M.D.   On: 06/09/2016 20:37    Scheduled Meds: . atorvastatin  20 mg Oral q1800  . azithromycin  500 mg Intravenous QHS  . cefTRIAXone (ROCEPHIN)  IV  1 g Intravenous Q0600  . diltiazem  240 mg Oral Daily  . feeding supplement (PRO-STAT SUGAR FREE 64)  30 mL Oral BID WC  . insulin aspart  0-5 Units Subcutaneous QHS  . insulin aspart  0-9 Units Subcutaneous TID WC  . ipratropium  0.5 mg Nebulization Q6H  . levalbuterol  1.25 mg Nebulization Q6H  .  levothyroxine  112 mcg Oral QAC breakfast  . methylPREDNISolone (SOLU-MEDROL) injection  60 mg Intravenous Q12H  . metoprolol tartrate  25 mg Oral BID  . omega-3 acid ethyl esters  1 g Oral BID  . oseltamivir  30 mg Oral NOW  . pantoprazole  40 mg Oral Daily  . [START ON 06/11/2016] warfarin  5 mg Oral Once per day on Sun Thu  . warfarin  7.5 mg Oral Once per day on Mon Tue Wed Fri Sat  . Warfarin - Pharmacist Dosing Inpatient  Does not apply q1800   Continuous Infusions: . sodium chloride 75 mL/hr at 06/10/16 0800     LOS: 1 day   Time spent: 25 minutes.  Vance Gather, MD [facility] Pager [phone]  If 7PM-7AM, please contact night-coverage [email or web address] Password [facility] 06/10/2016, 9:21 AM

## 2016-06-10 NOTE — Progress Notes (Signed)
Initial Nutrition Assessment  DOCUMENTATION CODES:  Non-severe (moderate) malnutrition in context of chronic illness    Pt meets criteria for  MODERATE MALNUTRITION in the context of Chronic illness as evidenced by Mild muscle wasting and moderate fat wasting.  INTERVENTION:  Glucerna Shake po BID, each supplement provides 220 kcal and 10 grams of protein  Continue Prostat BID  NUTRITION DIAGNOSIS:  Increased nutrient needs related to acute illness, chronic illness as evidenced by mild depletion of body muscle and moderate depletion of body fat  GOAL:  Patient will meet greater than or equal to 90% of their needs  MONITOR:  PO intake, Supplement acceptance, Labs, I & O's  REASON FOR ASSESSMENT:  Consult Assessment of nutrition requirement/status  ASSESSMENT:  83 y/o male PMHx HTN, HLD, DM, COPD, GERD, hypothyroidism, COPD, GERD, PAF, PE, CHF, CKD2, Lung cancer. Presents with SOB, cough, chills, confusion x 1 week. Worked up for acute on chronic resp failure w/ hypoxia due to CAP and copd exacerbation and sepsis.   Pt reports no acute decrease in his appetite. He endorses a chronically poor appetite. He eats 2 meals a day with 1 snack in between. He did not follow any type of diet at home, though notes he cannot drink milk due to intolerance. He did not take any type of supplements or vitamins.   He says the highest his Blood sugars ran at home was 150 mg/dl  He gives a UBW of 190 lbs. He denies any wt changes. Per documented wt history, his weight has been stable 180-190 lbs for the last couple years.   At this time, the patient is getting ready to eat lunch. (When reviewed later, the pt ate 100% of this mea).    He is not too enthusiastic about supplements.   Physical exam: displays mild-moderate muscle/fat wasting.   Labs: BG 250-316, Mag:1.1, K: 3.2,  Mediactions: IV abx, Insulin, Prostat, Omega 3s, Tamiflu, ppi, Prednisone, Warfarin, IVF  Recent Labs Lab 06/09/16 1921  06/09/16 1939 06/10/16 0944  NA 140 144 139  K 3.7 3.5 3.2*  CL 104 103 102  CO2 23  --  24  BUN 19 22* 15  CREATININE 1.36* 1.20 1.20  CALCIUM 8.6*  --  8.1*  MG  --   --  1.1*  GLUCOSE 110* 109* 268*   Diet Order:  Diet heart healthy/carb modified Room service appropriate? Yes; Fluid consistency: Thin  Skin:  Reviewed, no issues  Last BM:  3/22  Height:  Ht Readings from Last 1 Encounters:  06/10/16 '6\' 2"'$  (1.88 m)   Weight:  Wt Readings from Last 1 Encounters:  06/10/16 189 lb 13.1 oz (86.1 kg)   Wt Readings from Last 10 Encounters:  06/10/16 189 lb 13.1 oz (86.1 kg)  11/16/15 194 lb 8 oz (88.2 kg)  09/06/15 203 lb 12.8 oz (92.4 kg)  08/02/15 194 lb (88 kg)  07/12/15 186 lb 1.6 oz (84.4 kg)  07/07/15 179 lb (81.2 kg)  06/29/15 187 lb 12.8 oz (85.2 kg)  06/25/15 186 lb 15.2 oz (84.8 kg)  10/29/14 198 lb (89.8 kg)  07/27/14 192 lb (87.1 kg)   Ideal Body Weight:  86.36 kg  BMI:  Body mass index is 24.37 kg/m.  Estimated Nutritional Needs:  Kcal:  2050-2250 kcals (24-26 kcal/kg bw) Protein:  95-112g (1.1-1.3 g/kg bw) Fluid:  >2.2 L (25 ml/kg bw)  EDUCATION NEEDS:  No education needs identified at this time  Burtis Junes RD, LDN, [place] Clinical Nutrition  Pager: [phone] 06/10/2016 3:08 PM

## 2016-06-11 DIAGNOSIS — J209 Acute bronchitis, unspecified: Secondary | ICD-10-CM

## 2016-06-11 DIAGNOSIS — J440 Chronic obstructive pulmonary disease with acute lower respiratory infection: Secondary | ICD-10-CM

## 2016-06-11 LAB — GLUCOSE, CAPILLARY
GLUCOSE-CAPILLARY: 210 mg/dL — AB (ref 65–99)
Glucose-Capillary: 263 mg/dL — ABNORMAL HIGH (ref 65–99)
Glucose-Capillary: 199 mg/dL — ABNORMAL HIGH (ref 65–99)
Glucose-Capillary: 246 mg/dL — ABNORMAL HIGH (ref 65–99)

## 2016-06-11 LAB — BASIC METABOLIC PANEL
Anion gap: 13 (ref 5–15)
BUN: 27 mg/dL — AB (ref 6–20)
CALCIUM: 8.4 mg/dL — AB (ref 8.9–10.3)
CO2: 22 mmol/L (ref 22–32)
CREATININE: 1.04 mg/dL (ref 0.61–1.24)
Chloride: 100 mmol/L — ABNORMAL LOW (ref 101–111)
GFR calc Af Amer: >60 mL/min (ref >60)
GLUCOSE: 256 mg/dL — AB (ref 65–99)
Potassium: 3.1 mmol/L — ABNORMAL LOW (ref 3.5–5.1)
Sodium: 135 mmol/L (ref 135–145)

## 2016-06-11 LAB — PROTIME-INR
INR: 2.53
Prothrombin Time: 27.8 seconds — ABNORMAL HIGH (ref 11.4–15.2)

## 2016-06-11 LAB — PROCALCITONIN: PROCALCITONIN: 0.54 ng/mL

## 2016-06-11 LAB — MAGNESIUM: Magnesium: 1.4 mg/dL — ABNORMAL LOW (ref 1.7–2.4)

## 2016-06-11 MED ORDER — MAGNESIUM SULFATE 2 GM/50ML IV SOLN
2.0000 g | Freq: Once | INTRAVENOUS | Status: AC
  Administered 2016-06-11: 2 g via INTRAVENOUS
  Filled 2016-06-11: qty 50

## 2016-06-11 MED ORDER — POTASSIUM CHLORIDE CRYS ER 20 MEQ PO TBCR
40.0000 meq | EXTENDED_RELEASE_TABLET | Freq: Two times a day (BID) | ORAL | Status: AC
  Administered 2016-06-11: 40 meq via ORAL
  Filled 2016-06-11: qty 2

## 2016-06-11 MED ORDER — LEVOFLOXACIN 500 MG PO TABS
500.0000 mg | ORAL_TABLET | Freq: Every day | ORAL | Status: DC
  Administered 2016-06-11 – 2016-06-12 (×2): 500 mg via ORAL
  Filled 2016-06-11 (×2): qty 1

## 2016-06-11 MED ORDER — POTASSIUM CHLORIDE CRYS ER 20 MEQ PO TBCR
40.0000 meq | EXTENDED_RELEASE_TABLET | Freq: Once | ORAL | Status: AC
  Administered 2016-06-11: 40 meq via ORAL
  Filled 2016-06-11: qty 2

## 2016-06-11 NOTE — Progress Notes (Addendum)
PT Cancellation Note  Patient Details Name: Justin Garner MRN: [id number] DOB: Jul 25, 1932   Cancelled Treatment:    Reason Eval/Treat Not Completed: Other (comment)  Received Imminent discharge PT orders.  Patient not discharging today. Will see in am for PT eval.   Despina Pole 06/11/2016, 1:20 PM Carita Pian. Sanjuana Kava, [place] Pager [phone]

## 2016-06-11 NOTE — Progress Notes (Signed)
Attempted to call report 2nd time, no answer when unit called. Consuelo Pandy RN

## 2016-06-11 NOTE — Progress Notes (Signed)
ANTICOAGULATION CONSULT NOTE - Follow-up Consult  Pharmacy Consult for Coumadin Indication: atrial fibrillation and pulmonary embolus  No Known Allergies  Patient Measurements: Height: '6\' 2"'$  (188 cm) Weight: 189 lb 13.1 oz (86.1 kg) IBW/kg (Calculated) : 82.2  Vital Signs: Temp: 97.8 F (36.6 C) (03/25 0725) Temp Source: Axillary (03/25 0725) BP: 130/88 (03/25 0725) Pulse Rate: 114 (03/25 0725)  Labs:  Recent Labs  06/09/16 1921 06/09/16 1939 06/10/16 0226 06/10/16 0944 06/11/16 0255  HGB 15.8 16.7  --   --   --   HCT 46.2 49.0  --   --   --   PLT 286  --   --   --   --   LABPROT 26.3*  --  26.2*  --  27.8*  INR 2.36  --  2.36  --  2.53  CREATININE 1.36* 1.20  --  1.20 1.04    Estimated Creatinine Clearance: 63.7 mL/min (by C-G formula based on SCr of 1.04 mg/dL).   Medical History: Past Medical History:  Diagnosis Date  . ASCVD (arteriosclerotic cardiovascular disease)   . Cancer [facility]) 2009   Upper lobectomy;radiation; chemotherapy  . Chronic systolic CHF (congestive heart failure) ([place])   . CKD (chronic kidney disease), stage II   . COPD (chronic obstructive pulmonary disease) ([place])   . Coronary artery disease   . Diabetes mellitus   . GERD (gastroesophageal reflux disease)   . Hyperlipidemia   . Hypertension   . Leukocytosis 07/12/2015  . PAF (paroxysmal atrial fibrillation) (HCC)    Chronic warfarin therapy  . 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 Pulmonary embolism (HCC)    PMH of  . Thyroid disease     Medications:  Prescriptions Prior to Admission  Medication Sig Dispense Refill Last Dose  . atorvastatin (LIPITOR) 20 MG tablet Take 1 tablet (20 mg total) by mouth daily. 90 tablet 3 06/09/2016 at Unknown time  . budesonide-formoterol (SYMBICORT) 80-4.5 MCG/ACT inhaler  Inhale 2 puffs into the lungs every morning. 1 Inhaler 2 06/09/2016 at Unknown time  . dextromethorphan-guaiFENesin (MUCINEX DM) 30-600 MG 12hr tablet Take 1 tablet by mouth 2 (two) times daily as needed for cough. 14 tablet 0 06/06/2016 at Unknown time  . diltiazem (CARDIZEM CD) 240 MG 24 hr capsule Take 1 capsule (240 mg total) by mouth daily. 90 capsule 3 06/09/2016 at Unknown time  . fish oil-omega-3 fatty acids 1000 MG capsule Take 1 g by mouth daily.    06/09/2016 at Unknown time  . furosemide (LASIX) 20 MG tablet Take 20 mg by mouth daily.    06/09/2016 at Unknown time  . glipiZIDE (GLUCOTROL) 10 MG tablet Take 10 mg by mouth daily.  3 06/09/2016 at Unknown time  . levothyroxine (SYNTHROID, LEVOTHROID) 112 MCG tablet Take 112 mcg by mouth daily.     06/09/2016 at Unknown time  . metoprolol tartrate (LOPRESSOR) 25 MG tablet Take 0.5 tablets (12.5 mg total) by mouth 2 (two) times daily. 90 tablet 3 06/09/2016 at 0930  . omeprazole (PRILOSEC) 20 MG capsule Take 20 mg by mouth daily.     06/09/2016 at Unknown time  . warfarin (COUMADIN) 5 MG tablet Take as directed by coumadin clinic (Patient taking differently: Take 5-7.5 mg by mouth daily. Take 1 tablet on Sun and Thurs. And 1 1/2 tablet all other days) 135  tablet 0 06/09/2016 at Unknown time  . albuterol (PROVENTIL HFA;VENTOLIN HFA) 108 (90 Base) MCG/ACT inhaler Inhale 2 puffs into the lungs every 6 (six) hours as needed for wheezing or shortness of breath. 1 Inhaler 2 prn  . Amino Acids-Protein Hydrolys (FEEDING SUPPLEMENT, PRO-STAT SUGAR FREE 64,) LIQD Take 30 mLs by mouth 2 (two) times daily with a meal. (Patient not taking: Reported on 06/10/2016) 900 mL 0 Not Taking at Unknown time  . feeding supplement, ENSURE ENLIVE, (ENSURE ENLIVE) LIQD Take 237 mLs by mouth daily. (Patient not taking: Reported on 06/10/2016) 30 Bottle 0 Not Taking at Unknown time  . nitroGLYCERIN (NITROSTAT) 0.4 MG SL tablet Place 0.4 mg under the tongue every 5 (five) minutes as  needed for chest pain (UP TO 3 DOSES BEFORE CALLING EMS). Reported on 09/06/2015   rescue   Scheduled:  . atorvastatin  20 mg Oral q1800  . diltiazem  240 mg Oral Daily  . feeding supplement (GLUCERNA SHAKE)  237 mL Oral BID BM  . feeding supplement (PRO-STAT SUGAR FREE 64)  30 mL Oral BID WC  . insulin aspart  0-20 Units Subcutaneous TID WC  . insulin aspart  0-5 Units Subcutaneous QHS  . ipratropium  0.5 mg Nebulization Q6H  . levalbuterol  1.25 mg Nebulization Q6H  . levofloxacin  500 mg Oral Daily  . levothyroxine  112 mcg Oral QAC breakfast  . metoprolol tartrate  25 mg Oral BID  . omega-3 acid ethyl esters  1 g Oral BID  . pantoprazole  40 mg Oral Daily  . potassium chloride  40 mEq Oral BID  . predniSONE  40 mg Oral Q breakfast  . warfarin  5 mg Oral Once per day on Sun Thu  . warfarin  7.5 mg Oral Once per day on Mon Tue Wed Fri Sat  . Warfarin - Pharmacist Dosing Inpatient   Does not apply q1800   Infusions:  . sodium chloride 75 mL/hr at 06/11/16 0725    Assessment: 83 yo male admitted for acute on chronic respiratory failure, to continue Coumadin for  Afib and PE; INR at goal PTA with last dose taken 3/23. Per notes, pt admitted with "pink mucus" however, MD documentation states not having active hemoptysis. INR this AM therapeutic at 2.53. CBC stable (Hgb 16.7). No overt s/s bleeding per notes.   Goal of Therapy:  INR 2-3   Plan:  Continue home Coumadin dose of 7.'5mg'$  MTuWFSat and '5mg'$  ThuSun  Daily INR and CBC PRN Monitor for s/s bleeding  Monitor for drug interaction with Levaquin   Argie Ramming, PharmD Pharmacy Resident  Pager [phone] 06/11/16 9:20 AM

## 2016-06-11 NOTE — Progress Notes (Addendum)
Pharmacy Antibiotic Note  Justin Garner is a 83 y.o. male admitted on 06/09/2016 with SOB, cough/chills, and AMS. Pharmacy has been consulted for Levaquin dosing for suspected CAP. Patient afebrile, with WBC 13.1 (last 3/23). SCr improved to 1.04 (baseline ~1.2) and urine output sufficient.   Plan: Levaquin 500 mg PO q24hr  Monitor clinical picture, renal function, culture results Follow-up length of therapy  Monitor drug drug interaction with warfarin   Height: '6\' 2"'$  (188 cm) Weight: 189 lb 13.1 oz (86.1 kg) IBW/kg (Calculated) : 82.2  Temp (24hrs), Avg:97.8 F (36.6 C), Min:97.5 F (36.4 C), Max:98.1 F (36.7 C)   Recent Labs Lab 06/09/16 1921 06/09/16 1939 06/09/16 2148 06/09/16 2357 06/10/16 0226 06/10/16 0944 06/11/16 0255  WBC 13.1*  --   --   --   --   --   --   CREATININE 1.36* 1.20  --   --   --  1.20 1.04  LATICACIDVEN  --   --  1.64 1.8 1.3  --   --     Estimated Creatinine Clearance: 63.7 mL/min (by C-G formula based on SCr of 1.04 mg/dL).    No Known Allergies  Antimicrobials this admission: Levaquin 3/25>>  CTX 3/24 > 3/25 Azith 3/23>3/25 Zosyn 3/23 x1 Vanc 3/23 x1  Microbiology results: 3/23 MRSA PCR: neg 3/23 blood cx: ngtd   Argie Ramming, PharmD Pharmacy Resident  Pager [phone] 06/11/16 9:04 AM

## 2016-06-11 NOTE — Progress Notes (Signed)
Attempted to call report, no answer when unit called. Consuelo Pandy RN

## 2016-06-11 NOTE — Discharge Instructions (Signed)

## 2016-06-11 NOTE — Progress Notes (Signed)
PROGRESS NOTE  Justin Garner  PZW:[id number] DOB: December 01, 1932 DOA: 06/09/2016 PCP: Kandice Hams, MD   Brief Narrative: Justin Garner is a 83 y.o. male with a history of hypertension, hyperlipidemia, diabetes mellitus, COPD, GERD, hypothyroidism, PAF and PE on Coumadin, dCHF, CKD-II, lung cancer (s/p lobectomy, radiation and chemotherapy) brought by his wife for chills, cough and dyspnea  associated with wheezing, worsening constantly over the past week.   On arrival Tmax in ED was 102.18F, tachypneic to 38, and in borderline rapid AFib (HR 95 - 121). WBC 13.1, lactate 1.64, INR 2.36, troponin negative, negative urinalysis, stable renal function. CXR showed perihilar infiltrate. He was admitted to SDU for CAP and concomitant COPD exacerbation. Though there was no documented hypoxemia, nursing staff state he became hypoxic overnight with increased WOB necessitating BiPAP for several hours. He was taken off 3/24 and has been stable on 2.5L supplemental oxygen, most recently weaned to room air. HR has been stable.   Assessment & Plan: Principal Problem:   Acute on chronic respiratory failure with hypoxia (HCC) Active Problems:   COPD with acute bronchitis (HCC)   Pulmonary embolism (HCC)   Coronary atherosclerosis of native coronary artery   Essential hypertension, benign   Sepsis ([place])   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 ([place])   CAP (community acquired pneumonia)   Hypothyroidism   HLD (hyperlipidemia)   Chronic diastolic CHF (congestive heart failure) (HCC)   Atrial fibrillation with RVR (HCC)   Acute encephalopathy  Acute on chronic respiratory failure with hypoxia due to CAP and COPD exacerbation and sepsis: Patient'scough, SOB, chills and infiltration of right perihilar on chest x-ray, consistent with CAP. Very coarse breath sounds including end-expiratory wheezing consistent with COPD exacerbation. Patient meets criteria for sepsis with leukocytosis, tachycardia and tachypnea.  Lactate is normal. Currently hemodynamically stable. Flu negative, DC tamiflu.  - Continue oxygen to maintain SpO2 88-92%. Will ambulate to see if still needs oxygen.  - Transfer to floor - Nebulizers: scheduled atrovent nebs and prn Xopenex (AFib) - Solu-Medrol 60 mg IV BID > will transition to prednisone, continue 3/24 - 3/27.  - CTX, azithromycin (received vanc/zosyn x1 in ED) > levaquin - Mucinex for cough  - Urine legionella and S. pneumococcal antigen - Follow up blood culture x2, sputum culture - PCT modest at 0.37. Trend in 48 hrs.   Acute encephalopathy: Patient has intermittent mild confusion, but oriented 3. Most likely due to CAP, COPD exacerbation and sepsis. Resolved per wife.  - Treat underlying issues, monitor - Delirium precautions  Atrial Fibrillation with RVR: Rate borderline this morning. Asymptomatic.  - CHA2DS2-VASc Scoreis 5, on coumadin. INR therapeutic at admission. - Increase metoprolol and keep diltiazem '240mg'$  po. Cardizem '10mg'$  IV q4h prn HR > 110bpm - Check K and Mg, replete as indicated. Repleting again today.  Hx of PE: - on coumadin as above  HTN:  - Continue home metoprolol, Cardizem - hold lasix due to sepsis - IV hydralazine prn  Stage IIA NSCLC of right upper lobe:s/p chemo, resection. He is followed up by Dr. Julien Nordmann - Dr. Julien Nordmann added to treatment team as FYI   T2DM:Last A1c 7.1 on 07/02/15, well controled. Holding glipizide and januvia - CBG w/SSI qAC/HS  UMP:NTIR LDL was 43 on 06/15/15 - Continue home lipitor  Hypothyroidism: Last TSH was 1.213 on 11/15/15 - Continue home synthroid  Stage II CKD: Baseline creatinine 1.1-1.3; Cr 1.39 > 1.2.  - Follow-up renal function by BMP - Avoid nephrotoxins  Chronic  diastolic IOE:VOJJKKX has 1+ leg edema, but no JVD. No pulmonary edema on chest x-ray. CHF seems to be compensated. BNP 249, equivocal.  - Continue metoprolol - Once sepsis physiology resolves, will restart lasix.  Likely 3/25  Protein-calorie malnutrition, severe ([place]): - Nutrition consult  DVT prophylaxis: Coumadin Code Status: Full Family Communication: Wife at bedside Disposition Plan: Anticipate DC in next 24 hours to home environment, transfer to floor. Therapy eval ordered.  Consultants:   None  Procedures:   BiPAP 3/23 > 3/24  Antimicrobials:  Vancomycin 3/23  Cefepime 3/23  Azithromycin 3/23 >> 3/25  Ceftriaxone 3/23 >>  3/25  Levaquin 3/25  Subjective: Pt feeling better, dyspnea improved. No chest pain. Back to baseline mental status per wife.  Objective: Vitals:   06/11/16 0414 06/11/16 0725 06/11/16 0800 06/11/16 1211  BP: 123/74 130/88  113/74  Pulse: 92 (!) 114  70  Resp: (!) 27 (!) 26  (!) 21  Temp: 98.1 F (36.7 C) 97.8 F (36.6 C)  97.4 F (36.3 C)  TempSrc: Oral Axillary  Axillary  SpO2: 98% 95% 94% 92%  Weight:      Height:        Intake/Output Summary (Last 24 hours) at 06/11/16 1432 Last data filed at 06/11/16 1300  Gross per 24 hour  Intake             3147 ml  Output             1350 ml  Net             1797 ml   Filed Weights   06/09/16 1912 06/10/16 0024  Weight: 88.5 kg (195 lb) 86.1 kg (189 lb 13.1 oz)    Examination: General exam: Pleasant elderly male in no distress Respiratory system: No accessory muscle use breathing room air at rest, more dyspnea with any exertion. Expiratory rhonchi without definite wheezing.  Cardiovascular system: Irreg irreg ~90bpm. No murmur, rub, or gallop. No JVD, and no pedal edema. Gastrointestinal system: Abdomen soft, non-tender, non-distended, with normoactive bowel sounds. No organomegaly or masses felt. Central nervous system: Alert and oriented. No focal neurological deficits. Extremities: Warm, no deformities Skin: Large right upper back surgical scar.  Psychiatry: Judgement and insight appear intact. Mood & affect appropriate.   Data Reviewed: I have personally reviewed following labs and  imaging studies  CBC:  Recent Labs Lab 06/09/16 1921 06/09/16 1939  WBC 13.1*  --   HGB 15.8 16.7  HCT 46.2 49.0  MCV 89.5  --   PLT 286  --    Basic Metabolic Panel:  Recent Labs Lab 06/09/16 1921 06/09/16 1939 06/10/16 0944 06/11/16 0255  NA 140 144 139 135  K 3.7 3.5 3.2* 3.1*  CL 104 103 102 100*  CO2 23  --  24 22  GLUCOSE 110* 109* 268* 256*  BUN 19 22* 15 27*  CREATININE 1.36* 1.20 1.20 1.04  CALCIUM 8.6*  --  8.1* 8.4*  MG  --   --  1.1* 1.4*   GFR: Estimated Creatinine Clearance: 63.7 mL/min (by C-G formula based on SCr of 1.04 mg/dL). Liver Function Tests:  Recent Labs Lab 06/09/16 1921  AST 41  ALT 28  ALKPHOS 180*  BILITOT 2.0*  PROT 6.4*  ALBUMIN 3.5   No results for input(s): LIPASE, AMYLASE in the last 168 hours. No results for input(s): AMMONIA in the last 168 hours. Coagulation Profile:  Recent Labs Lab 06/06/16 3818 06/09/16 1921 06/10/16 0226 06/11/16 0255  INR 2.4 2.36 2.36 2.53   Cardiac Enzymes: No results for input(s): CKTOTAL, CKMB, CKMBINDEX, TROPONINI in the last 168 hours. BNP (last 3 results) No results for input(s): PROBNP in the last 8760 hours. HbA1C: No results for input(s): HGBA1C in the last 72 hours. CBG:  Recent Labs Lab 06/10/16 1321 06/10/16 1653 06/10/16 2128 06/11/16 0901 06/11/16 1307  GLUCAP 316* 287* 240* 263* 199*   Lipid Profile: No results for input(s): CHOL, HDL, LDLCALC, TRIG, CHOLHDL, LDLDIRECT in the last 72 hours. Thyroid Function Tests: No results for input(s): TSH, T4TOTAL, FREET4, T3FREE, THYROIDAB in the last 72 hours. Anemia Panel: No results for input(s): VITAMINB12, FOLATE, FERRITIN, TIBC, IRON, RETICCTPCT in the last 72 hours. Urine analysis:    Component Value Date/Time   COLORURINE YELLOW 06/09/2016 1949   APPEARANCEUR CLEAR 06/09/2016 1949   LABSPEC 1.017 06/09/2016 1949   PHURINE 5.0 06/09/2016 1949   GLUCOSEU NEGATIVE 06/09/2016 1949   HGBUR SMALL (A) 06/09/2016  1949   BILIRUBINUR NEGATIVE 06/09/2016 1949   KETONESUR NEGATIVE 06/09/2016 1949   PROTEINUR 100 (A) 06/09/2016 1949   UROBILINOGEN 4.0 (H) 12/27/2013 2225   NITRITE NEGATIVE 06/09/2016 1949   LEUKOCYTESUR NEGATIVE 06/09/2016 1949   Recent Results (from the past 240 hour(s))  Culture, blood (Routine X 2) w Reflex to ID Panel     Status: None (Preliminary result)   Collection Time: 06/09/16  9:35 PM  Result Value Ref Range Status   Specimen Description BLOOD RIGHT HAND  Final   Special Requests BOTTLES DRAWN AEROBIC AND ANAEROBIC 5CC  Final   Culture NO GROWTH 2 DAYS  Final   Report Status PENDING  Incomplete  Culture, blood (Routine X 2) w Reflex to ID Panel     Status: None (Preliminary result)   Collection Time: 06/09/16  9:40 PM  Result Value Ref Range Status   Specimen Description BLOOD LEFT ANTECUBITAL  Final   Special Requests BOTTLES DRAWN AEROBIC AND ANAEROBIC 5CC  Final   Culture NO GROWTH 2 DAYS  Final   Report Status PENDING  Incomplete  MRSA PCR Screening     Status: None   Collection Time: 06/09/16 11:55 PM  Result Value Ref Range Status   MRSA by PCR NEGATIVE NEGATIVE Final    Comment:        The GeneXpert MRSA Assay (FDA approved for NASAL specimens only), is one component of a comprehensive MRSA colonization surveillance program. It is not intended to diagnose MRSA infection nor to guide or monitor treatment for MRSA infections.       Radiology Studies: Dg Chest 2 View  Result Date: 06/09/2016 CLINICAL DATA:  Shortness of breath and coughing. Chills this evening. History of lung cancer, diabetes and congestive heart failure. EXAM: CHEST  2 VIEW COMPARISON:  11/14/2015 and 06/28/2015 radiographs. Chest CT 06/20/2015. FINDINGS: The heart size is stable for the AP technique of the current frontal examination. There is aortic atherosclerosis. There is increased density in the right perihilar region, demonstrated on both views. Generalized interstitial prominence  and chronic blunting of the right costophrenic angle appear unchanged. No acute osseous findings are seen. IMPRESSION: New right perihilar airspace disease in this patient status post right upper lobe resection for lung cancer. Findings are favored to reflect pneumonia. Followup PA and lateral chest X-ray is recommended in 3-4 weeks following trial of antibiotic therapy to ensure resolution and exclude recurrent malignancy. Electronically Signed   By: Richardean Sale M.D.   On: 06/09/2016 20:37  Scheduled Meds: . atorvastatin  20 mg Oral q1800  . diltiazem  240 mg Oral Daily  . feeding supplement (GLUCERNA SHAKE)  237 mL Oral BID BM  . feeding supplement (PRO-STAT SUGAR FREE 64)  30 mL Oral BID WC  . insulin aspart  0-20 Units Subcutaneous TID WC  . insulin aspart  0-5 Units Subcutaneous QHS  . ipratropium  0.5 mg Nebulization Q6H  . levalbuterol  1.25 mg Nebulization Q6H  . levofloxacin  500 mg Oral Daily  . levothyroxine  112 mcg Oral QAC breakfast  . metoprolol tartrate  25 mg Oral BID  . omega-3 acid ethyl esters  1 g Oral BID  . pantoprazole  40 mg Oral Daily  . predniSONE  40 mg Oral Q breakfast  . warfarin  5 mg Oral Once per day on Sun Thu  . warfarin  7.5 mg Oral Once per day on Mon Tue Wed Fri Sat  . Warfarin - Pharmacist Dosing Inpatient   Does not apply q1800   Continuous Infusions: . sodium chloride Stopped (06/11/16 1126)     LOS: 2 days   Time spent: 25 minutes.  Vance Gather, MD [facility] Pager [phone]  If 7PM-7AM, please contact night-coverage [email or web address] Password [facility] 06/11/2016, 2:32 PM

## 2016-06-11 NOTE — Progress Notes (Addendum)
Pt transferred from 4E via wheelchair. Report given by Consuelo Pandy RN. NS @ 75 restarted, vitals taken, pt given ice water and ice pitcher. No other needs at this time. Will continue to follow as needed.

## 2016-06-12 LAB — BASIC METABOLIC PANEL
Anion gap: 11 (ref 5–15)
BUN: 29 mg/dL — AB (ref 6–20)
CO2: 24 mmol/L (ref 22–32)
CREATININE: 1.16 mg/dL (ref 0.61–1.24)
Calcium: 8.9 mg/dL (ref 8.9–10.3)
Chloride: 102 mmol/L (ref 101–111)
GFR calc Af Amer: >60 mL/min (ref >60)
GFR, EST NON AFRICAN AMERICAN: 57 mL/min — AB (ref >60)
GLUCOSE: 214 mg/dL — AB (ref 65–99)
POTASSIUM: 4.1 mmol/L (ref 3.5–5.1)
SODIUM: 137 mmol/L (ref 135–145)

## 2016-06-12 LAB — GLUCOSE, CAPILLARY: Glucose-Capillary: 213 mg/dL — ABNORMAL HIGH (ref 65–99)

## 2016-06-12 LAB — LEGIONELLA PNEUMOPHILA SEROGP 1 UR AG: L. pneumophila Serogp 1 Ur Ag: NEGATIVE

## 2016-06-12 LAB — PROTIME-INR
INR: 2.47
Prothrombin Time: 27.2 seconds — ABNORMAL HIGH (ref 11.4–15.2)

## 2016-06-12 MED ORDER — LEVOFLOXACIN 500 MG PO TABS: 500.0000 mg | ORAL_TABLET | Freq: Every day | ORAL | 0 refills | Status: DC

## 2016-06-12 MED ORDER — PREDNISONE 20 MG PO TABS: 40.0000 mg | ORAL_TABLET | Freq: Every day | ORAL | 0 refills | Status: DC

## 2016-06-12 MED ORDER — METOPROLOL TARTRATE 25 MG PO TABS: 25.0000 mg | ORAL_TABLET | Freq: Two times a day (BID) | ORAL | 0 refills | Status: DC

## 2016-06-12 MED ORDER — LEVALBUTEROL HCL 1.25 MG/0.5ML IN NEBU: 1.2500 mg | INHALATION_SOLUTION | Freq: Two times a day (BID) | RESPIRATORY_TRACT | Status: DC

## 2016-06-12 MED ORDER — IPRATROPIUM BROMIDE 0.02 % IN SOLN: 0.5000 mg | Freq: Two times a day (BID) | RESPIRATORY_TRACT | Status: DC

## 2016-06-12 NOTE — Evaluation (Addendum)
Physical Therapy Evaluation Patient Details Name: KHANG HANNUM MRN: [id number] DOB: 1932/12/27 Today's Date: 06/12/2016   History of Present Illness  Pt is an 83 y.o. male with a history of hypertension, hyperlipidemia, diabetes mellitus, COPD, GERD, hypothyroidism, PAF and PE on Coumadin, dCHF, CKD-II, and lung cancer (s/p lobectomy, radiation and chemotherapy). He was admitted for acute on chronic resp failure with hypoxia.  Clinical Impression  PT eval complete. Pt required supervision for transfers and ambulation 180 feet with RW. Pt educated on energy conservation techniques. He and his wife verbalize understanding. He will have 24-hour assist from his wife at home. He also has cane and RW at home. Pt and spouse feel he will progress back to baseline without HHPT intervention. This therapist is in agreement. Pt to d/c home today. PT signing off.    Follow Up Recommendations No PT follow up;Supervision/Assistance - 24 hour    Equipment Recommendations  None recommended by PT    Recommendations for Other Services       Precautions / Restrictions Precautions Precautions: None      Mobility  Bed Mobility Overal bed mobility: Modified Independent                Transfers Overall transfer level: Needs assistance Equipment used: Rolling walker (2 wheeled) Transfers: Sit to/from Stand Sit to Stand: Supervision            Ambulation/Gait Ambulation/Gait assistance: Supervision Ambulation Distance (Feet): 180 Feet Assistive device: Rolling walker (2 wheeled) Gait Pattern/deviations: Step-through pattern;Decreased stride length Gait velocity: decreased Gait velocity interpretation: Below normal speed for age/gender General Gait Details: SpO2 88% at rest on RA and 90% after ambulation on RA.  Stairs Stairs:  (Pt declined stair training reporting no concerns getting into his home.)          Wheelchair Mobility    Modified Rankin (Stroke Patients Only)        Balance Overall balance assessment: Needs assistance Sitting-balance support: Feet supported;No upper extremity supported Sitting balance-Leahy Scale: Good     Standing balance support: Bilateral upper extremity supported;During functional activity Standing balance-Leahy Scale: Fair Standing balance comment: RW needed for ambulation.                             Pertinent Vitals/Pain Pain Assessment: No/denies pain    Home Living Family/patient expects to be discharged to:: Private residence Living Arrangements: Spouse/significant other Available Help at Discharge: Family;Available 24 hours/day Type of Home: Mobile home Home Access: Stairs to enter Entrance Stairs-Rails: Right;Left;Can reach both Entrance Stairs-Number of Steps: 4 Home Layout: One level Home Equipment: Walker - 2 wheels;Cane - single point      Prior Function Level of Independence: Independent with assistive device(s)   Gait / Transfers Assistance Needed: Pt ambulates with cane.  ADL's / Homemaking Assistance Needed: Pt independent with bADLS. Wife does cooking, cleaning and driving.        Hand Dominance   Dominant Hand: Right    Extremity/Trunk Assessment   Upper Extremity Assessment Upper Extremity Assessment: Overall WFL for tasks assessed    Lower Extremity Assessment Lower Extremity Assessment: Overall WFL for tasks assessed    Cervical / Trunk Assessment Cervical / Trunk Assessment: Kyphotic  Communication   Communication: HOH  Cognition Arousal/Alertness: Awake/alert Behavior During Therapy: WFL for tasks assessed/performed Overall Cognitive Status: Within Functional Limits for tasks assessed  General Comments      Exercises     Assessment/Plan    PT Assessment Patent does not need any further PT services  PT Problem List         PT Treatment Interventions      PT Goals (Current goals can be found  in the Care Plan section)  Acute Rehab PT Goals Patient Stated Goal: home PT Goal Formulation: All assessment and education complete, DC therapy    Frequency     Barriers to discharge        Co-evaluation               End of Session Equipment Utilized During Treatment: Gait belt Activity Tolerance: Patient tolerated treatment well Patient left: in bed;with call bell/phone within reach;with family/visitor present Nurse Communication: Mobility status PT Visit Diagnosis: Difficulty in walking, not elsewhere classified (R26.2)    Time: 0076-2263 PT Time Calculation (min) (ACUTE ONLY): 17 min   Charges:   PT Evaluation $PT Eval Moderate Complexity: 1 Procedure     PT G Codes:        Lorrin Goodell, PT  Office # [phone] Pager [phone]   Lorriane Shire 06/12/2016, 10:32 AM

## 2016-06-12 NOTE — Progress Notes (Signed)
ANTICOAGULATION CONSULT NOTE - Follow-up Consult  Pharmacy Consult for warfarin Indication: atrial fibrillation and pulmonary embolus  No Known Allergies  Patient Measurements: Height: '6\' 2"'$  (188 cm) Weight: 189 lb 13.1 oz (86.1 kg) IBW/kg (Calculated) : 82.2  Vital Signs: Temp: 97.6 F (36.4 C) (03/26 0550) Temp Source: Oral (03/25 2126) BP: 122/88 (03/26 0550) Pulse Rate: 74 (03/26 0550)  Labs:  Recent Labs  06/09/16 1921 06/09/16 1939 06/10/16 0226 06/10/16 0944 06/11/16 0255 06/12/16 0541  HGB 15.8 16.7  --   --   --   --   HCT 46.2 49.0  --   --   --   --   PLT 286  --   --   --   --   --   LABPROT 26.3*  --  26.2*  --  27.8* 27.2*  INR 2.36  --  2.36  --  2.53 2.47  CREATININE 1.36* 1.20  --  1.20 1.04 1.16    Estimated Creatinine Clearance: 57.1 mL/min (by C-G formula based on SCr of 1.16 mg/dL).   Medical History: Past Medical History:  Diagnosis Date  . ASCVD (arteriosclerotic cardiovascular disease)   . Cancer [facility] 2) 2009   Upper lobectomy;radiation; chemotherapy  . Chronic systolic CHF (congestive heart failure) ([place])   . CKD (chronic kidney disease), stage II   . COPD (chronic obstructive pulmonary disease) ([place])   . Coronary artery disease   . Diabetes mellitus   . GERD (gastroesophageal reflux disease)   . Hyperlipidemia   . Hypertension   . Leukocytosis 07/12/2015  . PAF (paroxysmal atrial fibrillation) (HCC)    Chronic warfarin therapy  . 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 Pulmonary embolism (HCC)    PMH of  . Thyroid disease     Medications:  Prescriptions Prior to Admission  Medication Sig Dispense Refill Last Dose  . atorvastatin (LIPITOR) 20 MG tablet Take 1 tablet (20 mg total) by mouth daily. 90 tablet 3 06/09/2016 at Unknown time  .  budesonide-formoterol (SYMBICORT) 80-4.5 MCG/ACT inhaler Inhale 2 puffs into the lungs every morning. 1 Inhaler 2 06/09/2016 at Unknown time  . dextromethorphan-guaiFENesin (MUCINEX DM) 30-600 MG 12hr tablet Take 1 tablet by mouth 2 (two) times daily as needed for cough. 14 tablet 0 06/06/2016 at Unknown time  . diltiazem (CARDIZEM CD) 240 MG 24 hr capsule Take 1 capsule (240 mg total) by mouth daily. 90 capsule 3 06/09/2016 at Unknown time  . fish oil-omega-3 fatty acids 1000 MG capsule Take 1 g by mouth daily.    06/09/2016 at Unknown time  . furosemide (LASIX) 20 MG tablet Take 20 mg by mouth daily.    06/09/2016 at Unknown time  . glipiZIDE (GLUCOTROL) 10 MG tablet Take 10 mg by mouth daily.  3 06/09/2016 at Unknown time  . levothyroxine (SYNTHROID, LEVOTHROID) 112 MCG tablet Take 112 mcg by mouth daily.     06/09/2016 at Unknown time  . metoprolol tartrate (LOPRESSOR) 25 MG tablet Take 0.5 tablets (12.5 mg total) by mouth 2 (two) times daily. 90 tablet 3 06/09/2016 at 0930  . omeprazole (PRILOSEC) 20 MG capsule Take 20 mg by mouth daily.     06/09/2016 at Unknown time  . warfarin (COUMADIN) 5 MG tablet Take as directed by coumadin clinic (Patient taking differently: Take 5-7.5 mg by mouth daily. Take  1 tablet on Sun and Thurs. And 1 1/2 tablet all other days) 135 tablet 0 06/09/2016 at Unknown time  . albuterol (PROVENTIL HFA;VENTOLIN HFA) 108 (90 Base) MCG/ACT inhaler Inhale 2 puffs into the lungs every 6 (six) hours as needed for wheezing or shortness of breath. 1 Inhaler 2 prn  . Amino Acids-Protein Hydrolys (FEEDING SUPPLEMENT, PRO-STAT SUGAR FREE 64,) LIQD Take 30 mLs by mouth 2 (two) times daily with a meal. (Patient not taking: Reported on 06/10/2016) 900 mL 0 Not Taking at Unknown time  . feeding supplement, ENSURE ENLIVE, (ENSURE ENLIVE) LIQD Take 237 mLs by mouth daily. (Patient not taking: Reported on 06/10/2016) 30 Bottle 0 Not Taking at Unknown time  . nitroGLYCERIN (NITROSTAT) 0.4 MG SL tablet  Place 0.4 mg under the tongue every 5 (five) minutes as needed for chest pain (UP TO 3 DOSES BEFORE CALLING EMS). Reported on 09/06/2015   rescue   Scheduled:  . atorvastatin  20 mg Oral q1800  . diltiazem  240 mg Oral Daily  . feeding supplement (GLUCERNA SHAKE)  237 mL Oral BID BM  . feeding supplement (PRO-STAT SUGAR FREE 64)  30 mL Oral BID WC  . insulin aspart  0-20 Units Subcutaneous TID WC  . insulin aspart  0-5 Units Subcutaneous QHS  . ipratropium  0.5 mg Nebulization Q6H  . levalbuterol  1.25 mg Nebulization Q6H  . levofloxacin  500 mg Oral Daily  . levothyroxine  112 mcg Oral QAC breakfast  . metoprolol tartrate  25 mg Oral BID  . omega-3 acid ethyl esters  1 g Oral BID  . pantoprazole  40 mg Oral Daily  . predniSONE  40 mg Oral Q breakfast  . warfarin  5 mg Oral Once per day on Sun Thu  . warfarin  7.5 mg Oral Once per day on Mon Tue Wed Fri Sat  . Warfarin - Pharmacist Dosing Inpatient   Does not apply q1800   Infusions:    Assessment: 83 yo male admitted for acute on chronic respiratory failure, to continue warfarin for  Afib and hx PE; Prior to admission INR at goal with last dose taken 3/23. Per notes, pt admitted with "pink mucus" however, MD documentation states not having active hemoptysis. INR this AM remains therapeutic at 2.47. CBC stable (last Hgb 16.7). No overt s/s bleeding per notes.   Goal of Therapy:  INR 2-3   Plan:  Continue home warfarin dose of 7.'5mg'$  MTuWFSat and '5mg'$  ThuSun  Daily INR and CBC PRN Monitor for s/s bleeding  Monitor for drug interaction with Levaquin    Thank you for allowing [place] to participate in this patients care.  Jens Som, PharmD Clinical phone for 06/12/2016 from 7a-3:30p: [phone] If after 3:30p, please call main pharmacy at: [phone] 06/12/2016 9:18 AM

## 2016-06-12 NOTE — Discharge Summary (Signed)
Physician Discharge Summary  Justin Garner GGY:[id number] DOB: [birth year] DOA: 81/23/2018  PCP: Kandice Hams, MD  Admit date: 06/09/2016 Discharge date: 81/27/2018  Admitted From: Home Disposition: Home   Recommendations for Outpatient Follow-up:  1. Follow up with PCP in 1-2 weeks. To continue abx and prednisone for community-acquired pneumonia and COPD exacerbation.  2. Please obtain BMP/CBC in one week 3. Follow up blood culture x2 (negative from 3/23 at discharge), sputum culture (pending at discharge)  Home Health: None recommended Equipment/Devices: None Discharge Condition: Stable CODE STATUS: Full Diet recommendation: Heart healthy  Brief/Interim Summary: Justin Amedee Griffinis a 83 y.o.malewith a history of hypertension, hyperlipidemia, diabetes mellitus, COPD, GERD, hypothyroidism, PAF and PE on Coumadin, dCHF, CKD-II, lung cancer (s/p lobectomy, radiation and chemotherapy) brought by his wife for chills, cough and dyspnea  associated with wheezing, worsening constantly over the past week.   On arrival Tmax in ED was 102.83F, tachypneic to 38, and in borderline rapid AFib (HR 95 - 121). WBC 13.1, lactate 1.64, INR 2.36, troponin negative, negative urinalysis, stable renal function. CXR showed perihilar infiltrate. He was admitted to SDU for CAP and concomitant COPD exacerbation. Though there was no documented hypoxemia, nursing staff state he became hypoxic overnight with increased WOB necessitating BiPAP for several hours. He was taken off 3/24 and has been stable on 2.5L supplemental oxygen, most recently weaned to room air. HR has been stable. Physical therapy has worked with him, recommending no follow up or DME. He is discharged with antibiotics and prednisone.   Discharge Diagnoses:  Principal Problem:   Acute on chronic respiratory failure with hypoxia (HCC) Active Problems:   COPD with acute bronchitis (HCC)   Pulmonary embolism (HCC)   Coronary atherosclerosis  of native coronary artery   Essential hypertension, benign   Sepsis ([place])   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 (HCC)   CAP (community acquired pneumonia)   Hypothyroidism   HLD (hyperlipidemia)   Chronic diastolic CHF (congestive heart failure) (HCC)   Atrial fibrillation with RVR (HCC)   Acute encephalopathy  Acute on chronic respiratory failure with hypoxia due to CAP and COPD exacerbation and sepsis: Patient'scough, SOB, chills and infiltration of right perihilar on chest x-ray, consistent with CAP. Very coarse breath sounds including end-expiratory wheezing consistent with COPD exacerbation. Patient met criteria for sepsis with leukocytosis, tachycardia and tachypnea. Lactate is normal. Currently hemodynamically stable. Flu negative, DC tamiflu.  - No further oxygen requirement. - Nebulizers: scheduled atrovent nebs and prn Xopenex (AFib), will continue home medications - Solu-Medrol 60 mg IV BID > will transition to prednisone, continue 3/24 - 3/27.  - CTX, azithromycin (received vanc/zosyn x1 in ED) > levaquin - Mucinex for cough  - Urinelegionella pending and S. pneumococcal antigen negative - PCT modest at 0.37. Will continue antibiotics.   Acute encephalopathy:Patient has intermittent mild confusion, but oriented 3. Most likely due to CAP, COPD exacerbation and sepsis. Resolved per wife.  - Resolved with treatment of underlying issues, monitor.  Atrial Fibrillation with RVR: Rate remained borderline elevated. Asymptomatic.  - CHA2DS2-VASc Scoreis 5, on coumadin. INR therapeutic at admission, continued.  - Increased metoprolol and kept diltiazem '240mg'$  po.  - Check K and Mg, repleted as indicated, returned to normal limits  Hx of PE: - on coumadin as above  HTN:  - Continue home metoprolol, Cardizem - hold lasix due to sepsis - IV hydralazine prn  Stage IIA NSCLC of right upper lobe:s/p chemo, resection. He is followed up by Dr. Julien Nordmann -  Dr. Julien Nordmann added to  treatment team as FYI   T2DM:Last A1c 7.1 on 07/02/15, well controled. Held glipizide and [place], restart at discharge. - CBG w/SSI qAC/HS  EQA:STMH LDL was 43 on 06/15/15 - Continue home lipitor  Hypothyroidism: Last TSH was 1.213 on 11/15/15 - Continue home synthroid  Stage II CKD: Baseline creatinine 1.1-1.3; Cr 1.39 > 1.16.  - Follow-up renal function by BMP - Avoid nephrotoxins  Chronic diastolic DQQ:IWLNLGX has 1+ leg edema, but no JVD. No pulmonary edema on chest x-ray. CHF seems to be compensated. BNP 249, equivocal.  - Continue metoprolol - Restart lasix as sepsis physiology resolved  Moderate protein-calorie malnutrition  - Nutrition consult recommended:  - Glucerna Shake po BID  - Continue Prostat BID  Discharge Instructions Discharge Instructions    Discharge instructions    Complete by:  As directed    You were admitted with pneumonia causing a COPD exacerbation and have improved with treatment. You are stable for discharge with the following recommendations:  - Keep taking antibiotics, levaquin as directed until you run out of medications.  - Keep taking prednisone as directed until you run out of medication. Both have been sent to your pharmacy.  - Change metoprolol: Take a full '25mg'$  instead of 1/2 tab as directed, to help control heart rate.  - Call your PCP for a follow up appointment in the next week. - If your symptoms return, seek medical attention right away.     Allergies as of 06/12/2016   No Known Allergies     Medication List    TAKE these medications   albuterol 108 (90 Base) MCG/ACT inhaler Commonly known as:  PROVENTIL HFA;VENTOLIN HFA Inhale 2 puffs into the lungs every 6 (six) hours as needed for wheezing or shortness of breath.   atorvastatin 20 MG tablet Commonly known as:  LIPITOR Take 1 tablet (20 mg total) by mouth daily.   budesonide-formoterol 80-4.5 MCG/ACT inhaler Commonly known as:  SYMBICORT Inhale 2 puffs into the  lungs every morning.   dextromethorphan-guaiFENesin 30-600 MG 12hr tablet Commonly known as:  MUCINEX DM Take 1 tablet by mouth 2 (two) times daily as needed for cough.   diltiazem 240 MG 24 hr capsule Commonly known as:  CARDIZEM CD Take 1 capsule (240 mg total) by mouth daily.   feeding supplement (ENSURE ENLIVE) Liqd Take 237 mLs by mouth daily.   feeding supplement (PRO-STAT SUGAR FREE 64) Liqd Take 30 mLs by mouth 2 (two) times daily with a meal.   fish oil-omega-3 fatty acids 1000 MG capsule Take 1 g by mouth daily.   furosemide 20 MG tablet Commonly known as:  LASIX Take 20 mg by mouth daily.   glipiZIDE 10 MG tablet Commonly known as:  GLUCOTROL Take 10 mg by mouth daily.   levofloxacin 500 MG tablet Commonly known as:  LEVAQUIN Take 1 tablet (500 mg total) by mouth daily.   levothyroxine 112 MCG tablet Commonly known as:  SYNTHROID, LEVOTHROID Take 112 mcg by mouth daily.   metoprolol tartrate 25 MG tablet Commonly known as:  LOPRESSOR Take 1 tablet (25 mg total) by mouth 2 (two) times daily. What changed:  how much to take   nitroGLYCERIN 0.4 MG SL tablet Commonly known as:  NITROSTAT Place 0.4 mg under the tongue every 5 (five) minutes as needed for chest pain (UP TO 3 DOSES BEFORE CALLING EMS). Reported on 09/06/2015   omeprazole 20 MG capsule Commonly known as:  PRILOSEC Take 20 mg  by mouth daily.   predniSONE 20 MG tablet Commonly known as:  DELTASONE Take 2 tablets (40 mg total) by mouth daily with breakfast.   warfarin 5 MG tablet Commonly known as:  COUMADIN Take as directed by coumadin clinic What changed:  how much to take  how to take this  when to take this  additional instructions      Follow-up Information    POLITE,RONALD D, MD. Schedule an appointment as soon as possible for a visit in 1 week.   Specialty:  Internal Medicine Why:  Left message for them to call patient at home with an followup Contact information: [address] [place] [address] [phone]        Kathee Delton, MD.   Specialty:  Pulmonary Disease Contact information: [place] [address] [phone]        Please follow up.   Why:  06/22/2016, 2:30P DR Polite         No Known Allergies  Consultations:  None  Procedures/Studies: Dg Chest 2 View  Result Date: 06/09/2016 CLINICAL DATA:  Shortness of breath and coughing. Chills this evening. History of lung cancer, diabetes and congestive heart failure. EXAM: CHEST  2 VIEW COMPARISON:  11/14/2015 and 06/28/2015 radiographs. Chest CT 06/20/2015. FINDINGS: The heart size is stable for the AP technique of the current frontal examination. There is aortic atherosclerosis. There is increased density in the right perihilar region, demonstrated on both views. Generalized interstitial prominence and chronic blunting of the right costophrenic angle appear unchanged. No acute osseous findings are seen. IMPRESSION: New right perihilar airspace disease in this patient status post right upper lobe resection for lung cancer. Findings are favored to reflect pneumonia. Followup PA and lateral chest X-ray is recommended in 3-4 weeks following trial of antibiotic therapy to ensure resolution and exclude recurrent malignancy. Electronically Signed   By: Richardean Sale M.D.   On: 06/09/2016 20:37    BiPAP 3/23 > 3/24  Subjective: Pt breathing nearly back to baseline, ambulating without significant dyspnea. No chest pain or palpitations.   Discharge Exam: Vitals:   06/12/16 0234 06/12/16 0550  BP:  122/88  Pulse: 68 74  Resp: 18 18  Temp:  97.6 F (36.4 C)   General exam: Pleasant elderly male in no distress Respiratory system: No accessory muscle use breathing room air. Expiratory rhonchi improved significantly from prior exams.  Cardiovascular system: Irreg irreg ~90bpm. No murmur, rub, or gallop. No JVD, and no pedal edema. Skin: Large right upper  back surgical scar.   The results of significant diagnostics from this hospitalization (including imaging, microbiology, ancillary and laboratory) are listed below for reference.    Labs: BNP (last 3 results)  Recent Labs  06/22/15 0244 11/15/15 0335 06/09/16 2357  BNP 101.1* 205.8* 250.5*   Basic Metabolic Panel:  Recent Labs Lab 06/09/16 1921 06/09/16 1939 06/10/16 0944 06/11/16 0255 06/12/16 0541  NA 140 144 139 135 137  K 3.7 3.5 3.2* 3.1* 4.1  CL 104 103 102 100* 102  CO2 23  --  '24 22 24  '$ GLUCOSE 110* 109* 268* 256* 214*  BUN 19 22* 15 27* 29*  CREATININE 1.36* 1.20 1.20 1.04 1.16  CALCIUM 8.6*  --  8.1* 8.4* 8.9  MG  --   --  1.1* 1.4*  --    Liver Function Tests:  Recent Labs Lab 06/09/16 1921  AST 41  ALT 28  ALKPHOS 180*  BILITOT 2.0*  PROT 6.4*  ALBUMIN 3.5   No results for input(s): LIPASE, AMYLASE in the last 168 hours. No results for input(s): AM[place]IA in the last 168 hours. CBC:  Recent Labs Lab 06/09/16 1921 06/09/16 1939  WBC 13.1*  --   HGB 15.8 16.7  HCT 46.2 49.0  MCV 89.5  --   PLT 286  --    Cardiac Enzymes: No results for input(s): CKTOTAL, CKMB, CKMBINDEX, TROPONINI in the last 168 hours. BNP: Invalid input(s): POCBNP CBG:  Recent Labs Lab 06/11/16 0901 06/11/16 1307 06/11/16 1728 06/11/16 2126 06/12/16 0752  GLUCAP 263* 199* 210* 246* 213*   D-Dimer No results for input(s): DDIMER in the last 72 hours. Hgb A1c No results for input(s): HGBA1C in the last 72 hours. Lipid Profile No results for input(s): CHOL, HDL, LDLCALC, TRIG, CHOLHDL, LDLDIRECT in the last 72 hours. Thyroid function studies No results for input(s): TSH, T4TOTAL, T3FREE, THYROIDAB in the last 72 hours.  Invalid input(s): FREET3 Anemia work up No results for input(s): VITAMINB12, FOLATE, FERRITIN, TIBC, IRON, RETICCTPCT in the last 72 hours. Urinalysis    Component Value Date/Time   COLORURINE YELLOW 06/09/2016 1949   APPEARANCEUR CLEAR  06/09/2016 1949   LABSPEC 1.017 06/09/2016 1949   [place] 5.0 06/09/2016 1949   GLUCOSEU NEGATIVE 06/09/2016 1949   HGBUR SMALL (A) 06/09/2016 1949   BILIRUBINUR NEGATIVE 06/09/2016 1949   KETONESUR NEGATIVE 06/09/2016 1949   PROTEINUR 100 (A) 06/09/2016 1949   UROBILINOGEN 4.0 (H) 12/27/2013 2225   NITRITE NEGATIVE 06/09/2016 1949   LEUKOCYTESUR NEGATIVE 06/09/2016 1949    Microbiology Recent Results (from the past 240 hour(s))  Culture, blood (Routine X 2) w Reflex to ID Panel     Status: None (Preliminary result)   Collection Time: 06/09/16  9:35 PM  Result Value Ref Range Status   Specimen Description BLOOD RIGHT HAND  Final   Special Requests BOTTLES DRAWN AEROBIC AND ANAEROBIC 5CC  Final   Culture NO GROWTH 4 DAYS  Final   Report Status PENDING  Incomplete  Culture, blood (Routine X 2) w Reflex to ID Panel     Status: None (Preliminary result)   Collection Time: 06/09/16  9:40 PM  Result Value Ref Range Status   Specimen Description BLOOD LEFT ANTECUBITAL  Final   Special Requests BOTTLES DRAWN AEROBIC AND ANAEROBIC 5CC  Final   Culture NO GROWTH 4 DAYS  Final   Report Status PENDING  Incomplete  MRSA PCR Screening     Status: None   Collection Time: 06/09/16 11:55 PM  Result Value Ref Range Status   MRSA by PCR NEGATIVE NEGATIVE Final    Comment:        The GeneXpert MRSA Assay (FDA approved for NASAL specimens only), is one component of a comprehensive MRSA colonization surveillance program. It is not intended to diagnose MRSA infection nor to guide or monitor treatment for MRSA infections.     Time coordinating discharge: Approximately 40 minutes  Vance Gather, MD  [facility] 06/13/2016, 4:36 PM Pager [phone]

## 2016-06-12 NOTE — Progress Notes (Signed)
Pt given discharge instructions, prescriptions, and care notes. Pt verbalized understanding AEB no further questions or concerns at this time. IV was discontinued, no redness, pain, or swelling noted at this time. Telemetry discontinued and Centralized Telemetry was notified. Pt left the floor via wheelchair with staff in stable condition. 

## 2016-06-14 ENCOUNTER — Telehealth: Payer: Self-pay | Admitting: Interventional Cardiology

## 2016-06-14 LAB — CULTURE, BLOOD (ROUTINE X 2)
CULTURE: NO GROWTH
CULTURE: NO GROWTH

## 2016-06-15 ENCOUNTER — Ambulatory Visit (INDEPENDENT_AMBULATORY_CARE_PROVIDER_SITE_OTHER): Payer: Medicare Other | Admitting: *Deleted

## 2016-06-15 DIAGNOSIS — I482 Chronic atrial fibrillation, unspecified: Secondary | ICD-10-CM

## 2016-06-15 DIAGNOSIS — I4891 Unspecified atrial fibrillation: Secondary | ICD-10-CM | POA: Diagnosis not present

## 2016-06-15 DIAGNOSIS — I2699 Other pulmonary embolism without acute cor pulmonale: Secondary | ICD-10-CM | POA: Diagnosis not present

## 2016-06-15 LAB — POCT INR: INR: 4.8

## 2016-06-22 ENCOUNTER — Ambulatory Visit (INDEPENDENT_AMBULATORY_CARE_PROVIDER_SITE_OTHER): Payer: Medicare Other | Admitting: *Deleted

## 2016-06-22 DIAGNOSIS — I482 Chronic atrial fibrillation, unspecified: Secondary | ICD-10-CM

## 2016-06-22 DIAGNOSIS — I4891 Unspecified atrial fibrillation: Secondary | ICD-10-CM

## 2016-06-22 DIAGNOSIS — I2699 Other pulmonary embolism without acute cor pulmonale: Secondary | ICD-10-CM | POA: Diagnosis not present

## 2016-06-22 LAB — POCT INR: INR: 1.9

## 2016-07-06 ENCOUNTER — Ambulatory Visit (INDEPENDENT_AMBULATORY_CARE_PROVIDER_SITE_OTHER): Payer: Medicare Other | Admitting: *Deleted

## 2016-07-06 DIAGNOSIS — I4891 Unspecified atrial fibrillation: Secondary | ICD-10-CM

## 2016-07-06 DIAGNOSIS — I2699 Other pulmonary embolism without acute cor pulmonale: Secondary | ICD-10-CM | POA: Diagnosis not present

## 2016-07-06 DIAGNOSIS — I482 Chronic atrial fibrillation, unspecified: Secondary | ICD-10-CM

## 2016-07-06 LAB — POCT INR: INR: 3.5

## 2016-07-10 ENCOUNTER — Other Ambulatory Visit: Payer: Self-pay | Admitting: Interventional Cardiology

## 2016-07-10 NOTE — Telephone Encounter (Signed)
error 

## 2016-07-18 ENCOUNTER — Encounter: Payer: Self-pay | Admitting: Interventional Cardiology

## 2016-07-20 ENCOUNTER — Ambulatory Visit (INDEPENDENT_AMBULATORY_CARE_PROVIDER_SITE_OTHER): Payer: Medicare Other | Admitting: *Deleted

## 2016-07-20 DIAGNOSIS — I482 Chronic atrial fibrillation, unspecified: Secondary | ICD-10-CM

## 2016-07-20 DIAGNOSIS — I4891 Unspecified atrial fibrillation: Secondary | ICD-10-CM | POA: Diagnosis not present

## 2016-07-20 DIAGNOSIS — I2699 Other pulmonary embolism without acute cor pulmonale: Secondary | ICD-10-CM

## 2016-07-20 LAB — POCT INR: INR: 2.4

## 2016-07-31 ENCOUNTER — Other Ambulatory Visit: Payer: Self-pay | Admitting: Interventional Cardiology

## 2016-08-01 NOTE — Progress Notes (Addendum)
Patient ID: Justin Garner, male   DOB: [birth year], 83 y.o.   MRN: [id number]     Cardiology Office Note   Date:  08/03/2016   ID:  [name], DOB 10-07-1932, MRN [id number]  PCP:  [name], MD    No chief complaint on file. Follow-up atrial fibrillation   Wt Readings from Last 3 Encounters:  08/03/16 190 lb 12.8 oz (86.5 kg)  06/10/16 189 lb 13.1 oz (86.1 kg)  11/16/15 194 lb 8 oz (88.2 kg)       History of Present Illness: Justin Garner is a 83 y.o. male  who has CAD, AFib.  PTX in Dec 2012, treated with chest tube.  He was hospitalized in 3/18 for pneumonia.  He feels that he is recovering.  c/o Shortness of breath at patient's baseline. - worse with walking up a hill, or stairs.  Worse with walking fast. Denies : Chest pain. Dizziness. Leg edema. Orthopnea. Palpitations. Syncope.  He does not report angina on current meds. No NTG use. Back in 2003 when he had stents placed, he did not have chest pressure at that time.  Cath was done a few years ago due to abnormal echo. no PCI needed in 2015 June.  He was admitted with lung issues in 3-4/17.  He had to follow with oncology as well.  His f/u was ok.  No further f/u with Dr. Earlie Server at this time.   March seems to be the time that he has lung problems.      No bleeding issues.  No recent dose changes.    Past Medical History:  Diagnosis Date  . ASCVD (arteriosclerotic cardiovascular disease)   . Cancer [place] [facility]) 2009   Upper lobectomy;radiation; chemotherapy  . Chronic systolic CHF (congestive heart failure) ([place])   . CKD (chronic kidney disease), stage II   . COPD (chronic obstructive pulmonary disease) ([place])   . Coronary artery disease   . Diabetes mellitus   . GERD (gastroesophageal reflux disease)   . Hyperlipidemia   . Hypertension   . Leukocytosis 07/12/2015  . PAF (paroxysmal atrial fibrillation) (HCC)    Chronic warfarin therapy  . Primary cancer of right upper lobe of lung ([place])  06/30/2013   RUL lobectomy 2009 with chest wall resection for superior sulcus tumor Postoperative radiation and chemotherapy; Dr Earlie Server Oncology monitor [phone]  . Pulmonary embolism (HCC)    PMH of  . Thyroid disease     Past Surgical History:  Procedure Laterality Date  . CAROTID STENT    . CHOLECYSTECTOMY    . FIBEROPTIC BRONCHOSCOPY AND MEIASTINOSCOPY  06/07/2007  . HERNIA REPAIR    . INGUINAL HERNIA REPAIR  10/10/2010  . LEFT HEART CATHETERIZATION WITH CORONARY ANGIOGRAM N/A 08/19/2013   Procedure: LEFT HEART CATHETERIZATION WITH CORONARY ANGIOGRAM;  Surgeon: Jettie Booze, MD;  Location: [facility] CATH LAB;  Service: Cardiovascular;  Laterality: N/A;  . R VATS,THORACOTOMY AND UPPER LOBECTOMY  08/02/2007     Current Outpatient Prescriptions  Medication Sig Dispense Refill  . albuterol (PROVENTIL HFA;VENTOLIN HFA) 108 (90 Base) MCG/ACT inhaler Inhale 2 puffs into the lungs every 6 (six) hours as needed for wheezing or shortness of breath. 1 Inhaler 2  . Amino Acids-Protein Hydrolys (FEEDING SUPPLEMENT, PRO-STAT SUGAR FREE 64,) LIQD Take 30 mLs by mouth 2 (two) times daily with a meal. 900 mL 0  . atorvastatin (LIPITOR) 20 MG tablet Take 1 tablet (20 mg total) by mouth daily. 90 tablet 3  . budesonide-formoterol (  SYMBICORT) 80-4.5 MCG/ACT inhaler Inhale 2 puffs into the lungs every morning. 1 Inhaler 2  . dextromethorphan-guaiFENesin (MUCINEX DM) 30-600 MG 12hr tablet Take 1 tablet by mouth 2 (two) times daily as needed for cough. 14 tablet 0  . diltiazem (CARDIZEM CD) 240 MG 24 hr capsule Take 1 capsule (240 mg total) by mouth daily. 90 capsule 3  . fish oil-omega-3 fatty acids 1000 MG capsule Take 1 g by mouth daily.     . furosemide (LASIX) 20 MG tablet Take 20 mg by mouth daily.     Marland Kitchen glipiZIDE (GLUCOTROL) 10 MG tablet Take 10 mg by mouth daily.  3  . levofloxacin (LEVAQUIN) 500 MG tablet Take 1 tablet (500 mg total) by mouth daily. 4 tablet 0  . levothyroxine (SYNTHROID,  LEVOTHROID) 112 MCG tablet Take 112 mcg by mouth daily.      . metoprolol tartrate (LOPRESSOR) 25 MG tablet TAKE 1/2 TABLET BY MOUTH TWICE A DAY (Patient taking differently: TAKE 1 TABLET BY MOUTH TWICE A DAY) 90 tablet 0  . nitroGLYCERIN (NITROSTAT) 0.4 MG SL tablet Place 0.4 mg under the tongue every 5 (five) minutes as needed for chest pain (UP TO 3 DOSES BEFORE CALLING EMS). Reported on 09/06/2015    . omeprazole (PRILOSEC) 20 MG capsule Take 20 mg by mouth daily.      Marland Kitchen warfarin (COUMADIN) 5 MG tablet TAKE AS DIRECTED BY  COUMADIN CLINIC 135 tablet 1   No current facility-administered medications for this visit.     Allergies:   Patient has no known allergies.    Social History:  The patient  reports that he quit smoking about 15 years ago. His smoking use included Cigarettes. He has a 50.00 pack-year smoking history. He has quit using smokeless tobacco. He reports that he does not drink alcohol or use drugs.   Family History:  The patient's family history includes Diabetes in his brother; Heart attack in his father; Heart disease in his father.    ROS:  Please see the history of present illness.   Otherwise, review of systems are positive for DOE; hospital stay in 3/18.   All other systems are reviewed and negative.    PHYSICAL EXAM: VS:  BP 128/80 (BP Location: Left Arm, Patient Position: Sitting)   Pulse 83   Ht '6\' 2"'$  (1.88 m)   Wt 190 lb 12.8 oz (86.5 kg)   BMI 24.50 kg/m  , BMI Body mass index is 24.5 kg/m. GEN: Well nourished, well developed, in no acute distress  HEENT: normal  Neck: no JVD, carotid bruits, or masses Cardiac: irregularly irregular; no murmurs, rubs, or gallops,; Right > left edema (chronic) Respiratory:  clear to auscultation bilaterally, normal work of breathing GI: soft, nontender, nondistended, + BS MS: no deformity or atrophy  Skin: warm and dry, no rash Neuro:  Strength and sensation are intact Psych: euthymic mood, flat affect  ECG: AFib with  controlled ventricular response  Recent Labs: 11/15/2015: TSH 1.213 06/09/2016: ALT 28; B Natriuretic Peptide 249.8; Hemoglobin 16.7; Platelets 286 06/11/2016: Magnesium 1.4 06/12/2016: BUN 29; Creatinine, Ser 1.16; Potassium 4.1; Sodium 137   Lipid Panel    Component Value Date/Time   CHOL 78 06/15/2015 1147   TRIG 98 06/15/2015 1147   HDL 15 (L) 06/15/2015 1147   CHOLHDL 5.2 06/15/2015 1147   VLDL 20 06/15/2015 1147   LDLCALC 43 06/15/2015 1147     Other studies Reviewed: Additional studies/ records that were reviewed today with results demonstrating:  cath in 2015 results reviewed.  2018 hospital records reviewed   ASSESSMENT AND PLAN:  1. Atrial fibrillation: Continue diltiazem for rate control. Metoprolol was increased in the hospital due to high heart rate. He is well rate controlled at this. Warfarin for stroke prevention- dose has been stable. 2. Coronary artery disease: Heart cath in 2015 showed no significant CAD. He does have prior stents placed in the LAD and circumflex from 2003.   No angina on medical therapy.  Lipids to be checked with Dr. Delfina Redwood.  Refill SL NTG. 3. Anticoagulant monitoring: Continue warfarin.  No bleeding issues. 4. Hypertension: Blood pressure well controlled today. Continue current medicines.  At home, BP can be in the low 161W systolic.  WOuld not increase any meds at this time. 5. Pulmonary: Continue management for COPD as well as prior lung malignancy.   Current medicines are reviewed at length with the patient today.  The patient concerns regarding his medicines were addressed.  The following changes have been made:  No change  Labs/ tests ordered today include:  No orders of the defined types were placed in this encounter.   Recommend 150 minutes/week of aerobic exercise Low fat, low carb, high fiber diet recommended  Disposition:   FU in  1 year   Signed, Larae Grooms, MD  08/03/2016 8:22 AM    [facility]  [facility], [place], [place]  [address] Phone: [phone]; Fax: [phone]

## 2016-08-03 ENCOUNTER — Ambulatory Visit (INDEPENDENT_AMBULATORY_CARE_PROVIDER_SITE_OTHER): Payer: Medicare Other | Admitting: Interventional Cardiology

## 2016-08-03 ENCOUNTER — Ambulatory Visit (INDEPENDENT_AMBULATORY_CARE_PROVIDER_SITE_OTHER): Payer: Medicare Other | Admitting: *Deleted

## 2016-08-03 ENCOUNTER — Encounter: Payer: Self-pay | Admitting: Interventional Cardiology

## 2016-08-03 VITALS — BP 128/80 | HR 83 | Ht 74.0 in | Wt 190.8 lb

## 2016-08-03 DIAGNOSIS — I25119 Atherosclerotic heart disease of native coronary artery with unspecified angina pectoris: Secondary | ICD-10-CM | POA: Diagnosis not present

## 2016-08-03 DIAGNOSIS — I482 Chronic atrial fibrillation, unspecified: Secondary | ICD-10-CM

## 2016-08-03 DIAGNOSIS — I481 Persistent atrial fibrillation: Secondary | ICD-10-CM

## 2016-08-03 DIAGNOSIS — Z7901 Long term (current) use of anticoagulants: Secondary | ICD-10-CM

## 2016-08-03 DIAGNOSIS — I10 Essential (primary) hypertension: Secondary | ICD-10-CM

## 2016-08-03 DIAGNOSIS — I4819 Other persistent atrial fibrillation: Secondary | ICD-10-CM

## 2016-08-03 LAB — POCT INR: INR: 2.9

## 2016-08-03 MED ORDER — NITROGLYCERIN 0.4 MG SL SUBL: 0.4000 mg | SUBLINGUAL_TABLET | SUBLINGUAL | 3 refills | Status: DC | PRN

## 2016-08-03 MED ORDER — NITROGLYCERIN 0.4 MG SL SUBL: SUBLINGUAL_TABLET | SUBLINGUAL | 3 refills | Status: AC

## 2016-08-03 NOTE — Patient Instructions (Signed)

## 2016-08-03 NOTE — Addendum Note (Signed)
Addended by: Drue Novel I on: 08/03/2016 09:05 AM   Modules accepted: Orders

## 2016-08-18 ENCOUNTER — Emergency Department (HOSPITAL_COMMUNITY): Payer: Medicare Other

## 2016-08-18 ENCOUNTER — Telehealth: Payer: Self-pay | Admitting: Interventional Cardiology

## 2016-08-18 ENCOUNTER — Inpatient Hospital Stay (HOSPITAL_COMMUNITY)
Admission: EM | Admit: 2016-08-18 | Discharge: 2016-08-21 | DRG: 291 | Disposition: A | Discharge status: Home Health | Payer: Medicare Other | Attending: Internal Medicine | Admitting: Internal Medicine

## 2016-08-18 ENCOUNTER — Encounter (HOSPITAL_COMMUNITY): Payer: Self-pay | Admitting: Nurse Practitioner

## 2016-08-18 DIAGNOSIS — J449 Chronic obstructive pulmonary disease, unspecified: Secondary | ICD-10-CM | POA: Diagnosis present

## 2016-08-18 DIAGNOSIS — J440 Chronic obstructive pulmonary disease with acute lower respiratory infection: Secondary | ICD-10-CM | POA: Diagnosis present

## 2016-08-18 DIAGNOSIS — K219 Gastro-esophageal reflux disease without esophagitis: Secondary | ICD-10-CM | POA: Diagnosis present

## 2016-08-18 DIAGNOSIS — E1165 Type 2 diabetes mellitus with hyperglycemia: Secondary | ICD-10-CM | POA: Diagnosis present

## 2016-08-18 DIAGNOSIS — E876 Hypokalemia: Secondary | ICD-10-CM | POA: Diagnosis present

## 2016-08-18 DIAGNOSIS — I2510 Atherosclerotic heart disease of native coronary artery without angina pectoris: Secondary | ICD-10-CM

## 2016-08-18 DIAGNOSIS — Z9221 Personal history of antineoplastic chemotherapy: Secondary | ICD-10-CM

## 2016-08-18 DIAGNOSIS — I130 Hypertensive heart and chronic kidney disease with heart failure and stage 1 through stage 4 chronic kidney disease, or unspecified chronic kidney disease: Secondary | ICD-10-CM | POA: Diagnosis not present

## 2016-08-18 DIAGNOSIS — I4891 Unspecified atrial fibrillation: Secondary | ICD-10-CM | POA: Diagnosis present

## 2016-08-18 DIAGNOSIS — J441 Chronic obstructive pulmonary disease with (acute) exacerbation: Secondary | ICD-10-CM | POA: Diagnosis present

## 2016-08-18 DIAGNOSIS — Z85118 Personal history of other malignant neoplasm of bronchus and lung: Secondary | ICD-10-CM

## 2016-08-18 DIAGNOSIS — I071 Rheumatic tricuspid insufficiency: Secondary | ICD-10-CM | POA: Diagnosis present

## 2016-08-18 DIAGNOSIS — Z79899 Other long term (current) drug therapy: Secondary | ICD-10-CM

## 2016-08-18 DIAGNOSIS — Z8249 Family history of ischemic heart disease and other diseases of the circulatory system: Secondary | ICD-10-CM

## 2016-08-18 DIAGNOSIS — Z86711 Personal history of pulmonary embolism: Secondary | ICD-10-CM

## 2016-08-18 DIAGNOSIS — I429 Cardiomyopathy, unspecified: Secondary | ICD-10-CM | POA: Diagnosis present

## 2016-08-18 DIAGNOSIS — J9621 Acute and chronic respiratory failure with hypoxia: Secondary | ICD-10-CM | POA: Diagnosis present

## 2016-08-18 DIAGNOSIS — E1122 Type 2 diabetes mellitus with diabetic chronic kidney disease: Secondary | ICD-10-CM | POA: Diagnosis present

## 2016-08-18 DIAGNOSIS — I482 Chronic atrial fibrillation, unspecified: Secondary | ICD-10-CM | POA: Diagnosis present

## 2016-08-18 DIAGNOSIS — Z7901 Long term (current) use of anticoagulants: Secondary | ICD-10-CM

## 2016-08-18 DIAGNOSIS — Z833 Family history of diabetes mellitus: Secondary | ICD-10-CM

## 2016-08-18 DIAGNOSIS — R0602 Shortness of breath: Secondary | ICD-10-CM | POA: Diagnosis not present

## 2016-08-18 DIAGNOSIS — J209 Acute bronchitis, unspecified: Secondary | ICD-10-CM | POA: Diagnosis present

## 2016-08-18 DIAGNOSIS — Z902 Acquired absence of lung [part of]: Secondary | ICD-10-CM

## 2016-08-18 DIAGNOSIS — I10 Essential (primary) hypertension: Secondary | ICD-10-CM | POA: Diagnosis present

## 2016-08-18 DIAGNOSIS — I2699 Other pulmonary embolism without acute cor pulmonale: Secondary | ICD-10-CM | POA: Diagnosis present

## 2016-08-18 DIAGNOSIS — N182 Chronic kidney disease, stage 2 (mild): Secondary | ICD-10-CM | POA: Diagnosis present

## 2016-08-18 DIAGNOSIS — Z86718 Personal history of other venous thrombosis and embolism: Secondary | ICD-10-CM

## 2016-08-18 DIAGNOSIS — I5021 Acute systolic (congestive) heart failure: Secondary | ICD-10-CM | POA: Diagnosis present

## 2016-08-18 DIAGNOSIS — C3411 Malignant neoplasm of upper lobe, right bronchus or lung: Secondary | ICD-10-CM | POA: Diagnosis present

## 2016-08-18 DIAGNOSIS — Z923 Personal history of irradiation: Secondary | ICD-10-CM

## 2016-08-18 DIAGNOSIS — E039 Hypothyroidism, unspecified: Secondary | ICD-10-CM | POA: Diagnosis present

## 2016-08-18 DIAGNOSIS — E785 Hyperlipidemia, unspecified: Secondary | ICD-10-CM | POA: Diagnosis present

## 2016-08-18 DIAGNOSIS — I5043 Acute on chronic combined systolic (congestive) and diastolic (congestive) heart failure: Secondary | ICD-10-CM | POA: Diagnosis present

## 2016-08-18 LAB — I-STAT ARTERIAL BLOOD GAS, ED
Acid-Base Excess: 2 mmol/L (ref 0.0–2.0)
Bicarbonate: 24.2 mmol/L (ref 20.0–28.0)
O2 Saturation: 96 %
PCO2 ART: 31 mmHg — AB (ref 32.0–48.0)
TCO2: 25 mmol/L (ref 0–100)
pH, Arterial: 7.5 — ABNORMAL HIGH (ref 7.350–7.450)
pO2, Arterial: 71 mmHg — ABNORMAL LOW (ref 83.0–108.0)

## 2016-08-18 LAB — PROTIME-INR
INR: 2.58
Prothrombin Time: 28.2 seconds — ABNORMAL HIGH (ref 11.4–15.2)

## 2016-08-18 LAB — RAPID URINE DRUG SCREEN, HOSP PERFORMED
AMPHETAMINES: NOT DETECTED
Barbiturates: NOT DETECTED
Benzodiazepines: NOT DETECTED
Cocaine: NOT DETECTED
Opiates: NOT DETECTED
Tetrahydrocannabinol: NOT DETECTED

## 2016-08-18 LAB — BASIC METABOLIC PANEL
Anion gap: 9 (ref 5–15)
BUN: 17 mg/dL (ref 6–20)
CHLORIDE: 101 mmol/L (ref 101–111)
CO2: 27 mmol/L (ref 22–32)
CREATININE: 1.1 mg/dL (ref 0.61–1.24)
Calcium: 8.5 mg/dL — ABNORMAL LOW (ref 8.9–10.3)
GFR calc Af Amer: >60 mL/min (ref >60)
GFR calc non Af Amer: >60 mL/min (ref >60)
GLUCOSE: 202 mg/dL — AB (ref 65–99)
POTASSIUM: 3.3 mmol/L — AB (ref 3.5–5.1)
Sodium: 137 mmol/L (ref 135–145)

## 2016-08-18 LAB — BRAIN NATRIURETIC PEPTIDE: B NATRIURETIC PEPTIDE 5: 356.4 pg/mL — AB (ref 0.0–100.0)

## 2016-08-18 LAB — URINALYSIS, ROUTINE W REFLEX MICROSCOPIC
BACTERIA UA: NONE SEEN
BILIRUBIN URINE: NEGATIVE
GLUCOSE, UA: NEGATIVE mg/dL
HGB URINE DIPSTICK: NEGATIVE
KETONES UR: NEGATIVE mg/dL
LEUKOCYTES UA: NEGATIVE
NITRITE: NEGATIVE
PROTEIN: 30 mg/dL — AB
Specific Gravity, Urine: 1.017 (ref 1.005–1.030)
pH: 5 (ref 5.0–8.0)

## 2016-08-18 LAB — CBC
HEMATOCRIT: 44.5 % (ref 39.0–52.0)
Hemoglobin: 14.9 g/dL (ref 13.0–17.0)
MCH: 30 pg (ref 26.0–34.0)
MCHC: 33.5 g/dL (ref 30.0–36.0)
MCV: 89.7 fL (ref 78.0–100.0)
PLATELETS: 405 10*3/uL — AB (ref 150–400)
RBC: 4.96 MIL/uL (ref 4.22–5.81)
RDW: 16.1 % — AB (ref 11.5–15.5)
WBC: 12.9 10*3/uL — ABNORMAL HIGH (ref 4.0–10.5)

## 2016-08-18 LAB — PROCALCITONIN: Procalcitonin: 0.14 ng/mL

## 2016-08-18 LAB — I-STAT TROPONIN, ED: Troponin i, poc: 0.02 ng/mL (ref 0.00–0.08)

## 2016-08-18 LAB — GLUCOSE, CAPILLARY: GLUCOSE-CAPILLARY: 188 mg/dL — AB (ref 65–99)

## 2016-08-18 LAB — CBG MONITORING, ED: GLUCOSE-CAPILLARY: 121 mg/dL — AB (ref 65–99)

## 2016-08-18 LAB — TSH: TSH: 2.18 u[IU]/mL (ref 0.350–4.500)

## 2016-08-18 LAB — TROPONIN I: TROPONIN I: 0.03 ng/mL — AB (ref <0.03)

## 2016-08-18 MED ORDER — WARFARIN - PHARMACIST DOSING INPATIENT: Freq: Every day | Status: DC

## 2016-08-18 MED ORDER — BISACODYL 10 MG RE SUPP: 10.0000 mg | Freq: Every day | RECTAL | Status: DC | PRN

## 2016-08-18 MED ORDER — ATORVASTATIN CALCIUM 20 MG PO TABS
20.0000 mg | ORAL_TABLET | Freq: Every day | ORAL | Status: DC
  Administered 2016-08-19 – 2016-08-21 (×3): 20 mg via ORAL
  Filled 2016-08-18 (×3): qty 1

## 2016-08-18 MED ORDER — IOPAMIDOL (ISOVUE-370) INJECTION 76%
INTRAVENOUS | Status: AC
  Filled 2016-08-18: qty 100

## 2016-08-18 MED ORDER — SODIUM CHLORIDE 0.9% FLUSH: 3.0000 mL | INTRAVENOUS | Status: DC | PRN

## 2016-08-18 MED ORDER — METHYLPREDNISOLONE SODIUM SUCC 125 MG IJ SOLR: 60.0000 mg | Freq: Two times a day (BID) | INTRAMUSCULAR | Status: DC

## 2016-08-18 MED ORDER — FUROSEMIDE 20 MG PO TABS: 20.0000 mg | ORAL_TABLET | Freq: Every day | ORAL | Status: DC

## 2016-08-18 MED ORDER — DEXTROSE 5 % IV SOLN
1.0000 g | INTRAVENOUS | Status: DC
  Administered 2016-08-18 – 2016-08-20 (×3): 1 g via INTRAVENOUS
  Filled 2016-08-18 (×4): qty 10

## 2016-08-18 MED ORDER — DEXTROSE 5 % IV SOLN
500.0000 mg | INTRAVENOUS | Status: DC
  Administered 2016-08-18 – 2016-08-19 (×2): 500 mg via INTRAVENOUS
  Filled 2016-08-18 (×3): qty 500

## 2016-08-18 MED ORDER — LEVOTHYROXINE SODIUM 112 MCG PO TABS
112.0000 ug | ORAL_TABLET | Freq: Every day | ORAL | Status: DC
  Administered 2016-08-19 – 2016-08-21 (×3): 112 ug via ORAL
  Filled 2016-08-18 (×3): qty 1

## 2016-08-18 MED ORDER — WARFARIN SODIUM 7.5 MG PO TABS
7.5000 mg | ORAL_TABLET | ORAL | Status: DC
  Administered 2016-08-18 – 2016-08-21 (×3): 7.5 mg via ORAL
  Filled 2016-08-18 (×3): qty 1

## 2016-08-18 MED ORDER — DM-GUAIFENESIN ER 30-600 MG PO TB12: 1.0000 | ORAL_TABLET | Freq: Two times a day (BID) | ORAL | Status: DC | PRN

## 2016-08-18 MED ORDER — POTASSIUM CHLORIDE CRYS ER 20 MEQ PO TBCR
40.0000 meq | EXTENDED_RELEASE_TABLET | Freq: Once | ORAL | Status: DC
  Filled 2016-08-18: qty 2

## 2016-08-18 MED ORDER — POTASSIUM CHLORIDE CRYS ER 20 MEQ PO TBCR
40.0000 meq | EXTENDED_RELEASE_TABLET | Freq: Once | ORAL | Status: AC
  Administered 2016-08-18: 40 meq via ORAL
  Filled 2016-08-18: qty 2

## 2016-08-18 MED ORDER — MOMETASONE FURO-FORMOTEROL FUM 100-5 MCG/ACT IN AERO
2.0000 | INHALATION_SPRAY | Freq: Two times a day (BID) | RESPIRATORY_TRACT | Status: DC
  Administered 2016-08-19 – 2016-08-21 (×5): 2 via RESPIRATORY_TRACT
  Filled 2016-08-18: qty 8.8

## 2016-08-18 MED ORDER — DILTIAZEM HCL ER COATED BEADS 240 MG PO CP24: 240.0000 mg | ORAL_CAPSULE | Freq: Every day | ORAL | Status: DC

## 2016-08-18 MED ORDER — ONDANSETRON HCL 4 MG/2ML IJ SOLN: 4.0000 mg | Freq: Four times a day (QID) | INTRAMUSCULAR | Status: DC | PRN

## 2016-08-18 MED ORDER — PANTOPRAZOLE SODIUM 40 MG PO TBEC
40.0000 mg | DELAYED_RELEASE_TABLET | Freq: Every day | ORAL | Status: DC
  Administered 2016-08-19 – 2016-08-21 (×3): 40 mg via ORAL
  Filled 2016-08-18 (×3): qty 1

## 2016-08-18 MED ORDER — SODIUM CHLORIDE 0.9% FLUSH
3.0000 mL | Freq: Two times a day (BID) | INTRAVENOUS | Status: DC
  Administered 2016-08-19 – 2016-08-20 (×4): 3 mL via INTRAVENOUS

## 2016-08-18 MED ORDER — ACETAMINOPHEN 325 MG PO TABS
650.0000 mg | ORAL_TABLET | Freq: Four times a day (QID) | ORAL | Status: DC | PRN
Start: 2016-08-18 — End: 2016-08-21
  Administered 2016-08-19: 650 mg via ORAL
  Filled 2016-08-18: qty 2

## 2016-08-18 MED ORDER — INSULIN ASPART 100 UNIT/ML ~~LOC~~ SOLN
0.0000 [IU] | Freq: Three times a day (TID) | SUBCUTANEOUS | Status: DC
Start: 2016-08-18 — End: 2016-08-21
  Administered 2016-08-18: 1 [IU] via SUBCUTANEOUS
  Administered 2016-08-19: 5 [IU] via SUBCUTANEOUS
  Administered 2016-08-19: 1 [IU] via SUBCUTANEOUS
  Administered 2016-08-20 (×3): 3 [IU] via SUBCUTANEOUS
  Administered 2016-08-21: 9 [IU] via SUBCUTANEOUS
  Filled 2016-08-18: qty 1

## 2016-08-18 MED ORDER — WARFARIN SODIUM 5 MG PO TABS: 5.0000 mg | ORAL_TABLET | ORAL | Status: DC

## 2016-08-18 MED ORDER — SENNOSIDES-DOCUSATE SODIUM 8.6-50 MG PO TABS: 1.0000 | ORAL_TABLET | Freq: Every evening | ORAL | Status: DC | PRN

## 2016-08-18 MED ORDER — ONDANSETRON HCL 4 MG PO TABS: 4.0000 mg | ORAL_TABLET | Freq: Four times a day (QID) | ORAL | Status: DC | PRN

## 2016-08-18 MED ORDER — ALBUTEROL SULFATE (2.5 MG/3ML) 0.083% IN NEBU: 3.0000 mL | INHALATION_SOLUTION | Freq: Four times a day (QID) | RESPIRATORY_TRACT | Status: DC | PRN

## 2016-08-18 MED ORDER — FUROSEMIDE 10 MG/ML IJ SOLN
60.0000 mg | Freq: Two times a day (BID) | INTRAMUSCULAR | Status: DC
  Administered 2016-08-18 – 2016-08-21 (×6): 60 mg via INTRAVENOUS
  Filled 2016-08-18 (×6): qty 6

## 2016-08-18 MED ORDER — METOPROLOL TARTRATE 12.5 MG HALF TABLET
12.5000 mg | ORAL_TABLET | Freq: Two times a day (BID) | ORAL | Status: DC
  Administered 2016-08-18: 12.5 mg via ORAL
  Filled 2016-08-18: qty 1

## 2016-08-18 MED ORDER — ACETAMINOPHEN 650 MG RE SUPP: 650.0000 mg | Freq: Four times a day (QID) | RECTAL | Status: DC | PRN

## 2016-08-18 MED ORDER — SODIUM CHLORIDE 0.9 % IV SOLN: 250.0000 mL | INTRAVENOUS | Status: DC | PRN

## 2016-08-18 NOTE — ED Notes (Signed)
Patient transported to X-ray 

## 2016-08-18 NOTE — ED Notes (Signed)
Patient at 92 % oxygen saturation while at rest. Upon sitting up on the edge of the bed to get up and walk patient saturation dropped to 88%. Patient placed back in bed and 2L of oxygen applied. EDP aware.

## 2016-08-18 NOTE — ED Notes (Signed)
Patient placed on 2 L o2

## 2016-08-18 NOTE — Telephone Encounter (Signed)
New Message   Pt wife call requesting to speak with Rn about pt feet and legs swelling. Pt wife wants to get him in for an appt today. Please call back to discuss

## 2016-08-18 NOTE — H&P (Signed)
History and Physical    THAXTON PELLEY WNI:[id number] DOB: Jul 14, 1932 DOA: 08/18/2016   PCP: [name], MD   Patient coming from:  Home    Chief Complaint: Fatigue    HPI: Justin Garner is a 83 y.o. male with medical history significant for lung cnacer not on chemo, chronic systolic CHF, CKD,  COPD not O2 dependent, DM, hyperlipidemia, hypertension, history of PAF on chronic Coumadin, history of PE , hypothyroidism, presenting to the ED, with increasing fatigue, and, which has been somewhat worsening over the last few days due to decreased motility.  His urine is darker as he has had less oral intake , He has been sleeping more than normal, with decreased activity in appetite. No increase confusion. Patient has been more short of breath, denies any cough, or hemoptysis. Denies any fever chills, recent infections, sick contacts. Denies any vomiting or nausea. No falls, trauma, or preceding couple episodes. Denies any chest pain or palpitations. Denies any abdominal pain. Denies constipation or diarrhea. No recent long distance trip. The patient has been compliant with his medications.  ED Course:  BP 125/82 (BP Location: Left Arm)   Pulse 83   Temp 97.3 F (36.3 C) (Oral)   Resp (!) 28   SpO2 96%    sodium 137 potassium 3.3  glucose 202  bicarb 27 creatinine 1.1  GFR  60   BNP 356 troponin 0.02  white count 12.9 hemoglobin 14.9 platelets 405  PT 28.2 INR 2.58  CXR  Resolved right medial lower airspace disease. Bilateral chronic fibrotic changes Last 2 D echo 3 3818 shows  Systolic  function was normal.  EF 50% to 55%. EKG Afib without ACS    Review of Systems:  As per HPI otherwise all other systems reviewed and are negative  Past Medical History:  Diagnosis Date  . ASCVD (arteriosclerotic cardiovascular disease)   . Cancer [facility]) 2009   Upper lobectomy;radiation; chemotherapy  . Chronic systolic CHF (congestive heart failure) ([place])   . CKD (chronic kidney disease),  stage II   . COPD (chronic obstructive pulmonary disease) ([place])   . Coronary artery disease   . Diabetes mellitus   . GERD (gastroesophageal reflux disease)   . Hyperlipidemia   . Hypertension   . Leukocytosis 07/12/2015  . PAF (paroxysmal atrial fibrillation) (HCC)    Chronic warfarin therapy  . Primary cancer of right upper lobe of lung ([place]) 06/30/2013   RUL lobectomy 2009 with chest wall resection for superior sulcus tumor Postoperative radiation and chemotherapy; Dr Earlie Server Oncology monitor [phone]  . Pulmonary embolism (HCC)    PMH of  . Thyroid disease     Past Surgical History:  Procedure Laterality Date  . CAROTID STENT    . CHOLECYSTECTOMY    . FIBEROPTIC BRONCHOSCOPY AND MEIASTINOSCOPY  06/07/2007  . HERNIA REPAIR    . INGUINAL HERNIA REPAIR  10/10/2010  . LEFT HEART CATHETERIZATION WITH CORONARY ANGIOGRAM N/A 08/19/2013   Procedure: LEFT HEART CATHETERIZATION WITH CORONARY ANGIOGRAM;  Surgeon: Jettie Booze, MD;  Location: [facility] CATH LAB;  Service: Cardiovascular;  Laterality: N/A;  . R VATS,THORACOTOMY AND UPPER LOBECTOMY  08/02/2007    Social History Social History   Social History  . Marital status: Married    Spouse name: N/A  . Number of children: N/A  . Years of education: N/A   Occupational History  . retired    Social History Main Topics  . Smoking status: Former Smoker    Packs/day: 1.00  Years: 50.00    Types: Cigarettes    Quit date: 03/20/2001  . Smokeless tobacco: Former Systems developer  . Alcohol use No  . Drug use: No  . Sexual activity: Not on file   Other Topics Concern  . Not on file   Social History Narrative  . No narrative on file     No Known Allergies  Family History  Problem Relation Age of Onset  . Heart disease Father        Heart attack  . Heart attack Father   . Diabetes Brother   . Cancer Neg Hx   . Stroke Neg Hx   . Hypertension Neg Hx       Prior to Admission medications   Medication Sig Start Date End Date  Taking? Authorizing Provider  albuterol (PROVENTIL HFA;VENTOLIN HFA) 108 (90 Base) MCG/ACT inhaler Inhale 2 puffs into the lungs every 6 (six) hours as needed for wheezing or shortness of breath. 11/16/15   Mikhail, Velta Addison, DO  Amino Acids-Protein Hydrolys (FEEDING SUPPLEMENT, PRO-STAT SUGAR FREE 64,) LIQD Take 30 mLs by mouth 2 (two) times daily with a meal. 11/16/15   Mikhail, Velta Addison, DO  atorvastatin (LIPITOR) 20 MG tablet Take 1 tablet (20 mg total) by mouth daily. 08/06/15   Jettie Booze, MD  budesonide-formoterol [facility]) 80-4.5 MCG/ACT inhaler Inhale 2 puffs into the lungs every morning. 11/16/15   Cristal Ford, DO  dextromethorphan-guaiFENesin [facility] DM) 30-600 MG 12hr tablet Take 1 tablet by mouth 2 (two) times daily as needed for cough. 11/16/15   Mikhail, Velta Addison, DO  diltiazem (CARDIZEM CD) 240 MG 24 hr capsule Take 1 capsule (240 mg total) by mouth daily. 08/09/15   Jettie Booze, MD  fish oil-omega-3 fatty acids 1000 MG capsule Take 1 g by mouth daily.     [provider]  furosemide (LASIX) 20 MG tablet Take 20 mg by mouth daily.     [provider]  glipiZIDE (GLUCOTROL) 10 MG tablet Take 10 mg by mouth daily. 08/09/15   [provider]  levofloxacin (LEVAQUIN) 500 MG tablet Take 1 tablet (500 mg total) by mouth daily. 06/13/16   Patrecia Pour, MD  levothyroxine (SYNTHROID, LEVOTHROID) 112 MCG tablet Take 112 mcg by mouth daily.      [provider]  metoprolol tartrate (LOPRESSOR) 25 MG tablet TAKE 1/2 TABLET BY MOUTH TWICE A DAY Patient taking differently: TAKE 1 TABLET BY MOUTH TWICE A DAY 07/11/16   Jettie Booze, MD  nitroGLYCERIN (NITROSTAT) 0.4 MG SL tablet Place 1 tablet (0.4 mg total) under the tongue every 5 (five) minutes as needed for chest pain up to 3 doses 08/03/16   Jettie Booze, MD  omeprazole (PRILOSEC) 20 MG capsule Take 20 mg by mouth daily.      [provider]  warfarin (COUMADIN) 5 MG  tablet TAKE AS DIRECTED BY  COUMADIN CLINIC 07/31/16   Jettie Booze, MD    Physical Exam:  Vitals:   08/18/16 1530 08/18/16 1540 08/18/16 1600 08/18/16 1643  BP: (!) 122/92 (!) 122/92 (!) 131/97 125/82  Pulse: 75 83 80 83  Resp: (!) 22 (!) 33 (!) 24 (!) 28  Temp:      TempSrc:      SpO2: 93% 94% 95% 96%   Constitutional: Moderate degree of discomfort due to shortness of breath, fatigue, very ill appearing.  Eyes: PERRL, lids and conjunctivae normal ENMT: Mucous membranes are moist, without exudate or lesions  Neck: normal,  supple, no masses, no thyromegaly Respiratory: essentially clear to auscultation bilaterally, no wheezing, no crackles. Normal respiratory effort  Cardiovascular: irregularly irregular rate and rhythm, no murmurs, rubs or gallops to plus bilateral lower extremity edema. 2+ pedal pulses. No carotid bruits.  Abdomen: Soft, non tender, No hepatosplenomegaly. Bowel sounds positive.  Musculoskeletal: no clubbing / cyanosis. Moves all extremities Skin: no jaundice, No lesions.  Neurologic: Sensation intact  Strength equal in all extremities Psychiatric:   Alert and oriented x 3.     Labs on Admission: I have personally reviewed following labs and imaging studies  CBC:  Recent Labs Lab 08/18/16 1247  WBC 12.9*  HGB 14.9  HCT 44.5  MCV 89.7  PLT 405*    Basic Metabolic Panel:  Recent Labs Lab 08/18/16 1247  NA 137  K 3.3*  CL 101  CO2 27  GLUCOSE 202*  BUN 17  CREATININE 1.10  CALCIUM 8.5*    GFR: CrCl cannot be calculated (Unknown ideal weight.).  Liver Function Tests: No results for input(s): AST, ALT, ALKPHOS, BILITOT, PROT, ALBUMIN in the last 168 hours. No results for input(s): LIPASE, AMYLASE in the last 168 hours. No results for input(s): AMMONIA in the last 168 hours.  Coagulation Profile:  Recent Labs Lab 08/18/16 1247  INR 2.58    Cardiac Enzymes: No results for input(s): CKTOTAL, CKMB, CKMBINDEX, TROPONINI in the  last 168 hours.  BNP (last 3 results) No results for input(s): PROBNP in the last 8760 hours.  HbA1C: No results for input(s): HGBA1C in the last 72 hours.  CBG:  Recent Labs Lab 08/18/16 1705  GLUCAP 121*    Lipid Profile: No results for input(s): CHOL, HDL, LDLCALC, TRIG, CHOLHDL, LDLDIRECT in the last 72 hours.  Thyroid Function Tests: No results for input(s): TSH, T4TOTAL, FREET4, T3FREE, THYROIDAB in the last 72 hours.  Anemia Panel: No results for input(s): VITAMINB12, FOLATE, FERRITIN, TIBC, IRON, RETICCTPCT in the last 72 hours.  Urine analysis:    Component Value Date/Time   COLORURINE YELLOW 08/18/2016 [place] 08/18/2016 1509   LABSPEC 1.017 08/18/2016 1509   PHURINE 5.0 08/18/2016 1509   GLUCOSEU NEGATIVE 08/18/2016 1509   HGBUR NEGATIVE 08/18/2016 1509   BILIRUBINUR NEGATIVE 08/18/2016 1509   KETONESUR NEGATIVE 08/18/2016 1509   PROTEINUR 30 (A) 08/18/2016 1509   UROBILINOGEN 4.0 (H) 12/27/2013 2225   NITRITE NEGATIVE 08/18/2016 1509   LEUKOCYTESUR NEGATIVE 08/18/2016 1509    Sepsis Labs: @LABRCNTIP (procalcitonin:4,lacticidven:4) )No results found for this or any previous visit (from the past 240 hour(s)).   Radiological Exams on Admission: Dg Chest 2 View  Result Date: 08/18/2016 CLINICAL DATA:  Short of breath and cough EXAM: CHEST  2 VIEW COMPARISON:  06/09/2016.  09/14/2015. FINDINGS: The heart remains markedly enlarged. Course chronic parenchymal changes throughout both lungs is stable. Pleural thickening at the right base is stable. Airspace disease seen on the recent chest x-ray a in the medial right middle and lower lobes has resolved appear no pneumothorax. Bullous changes at the left apex. Osteopenia. IMPRESSION: Resolved right medial lower airspace disease. Bilateral chronic fibrotic changes persist. Electronically Signed   By: Marybelle Killings M.D.   On: 08/18/2016 13:15    EKG: Independently reviewed.  Assessment/Plan  Active  Problems:   Acute systolic heart failure (HCC)   COPD with acute bronchitis (HCC)   Chronic atrial fibrillation (HCC)   Pulmonary embolism (HCC)   Coronary atherosclerosis of native coronary artery   Essential hypertension, benign  Primary cancer of right upper lobe of lung (HCC)   Hypokalemia   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 (HCC)   Hypothyroidism   CAD in native artery   COPD exacerbation (HCC)  Acute on chronic systolic congestive heart failure with hypoxia    BNP 346   [place] drop to 88 without O2    Weight  190 lbs  Last 2 D echo 3 3005 shows  Systolic  function was normal.  EF 50% to 55%. Admit to SDU     CHF order set  - TSH  - Cycle troponins - Daily weights and strict I/O - ECHO  - Lasix 60 mg IV BID Spoke with Dr. Meda [facility]. To consult in am   COPD with possible exacerbation Osats in the 80s without O2, currently at 2 L [place]   CXR unrevealing   WBC  12.9  Continue nebs and O2 prn  Procalcitonin CXR in am   Atrial Fibrillation/ History of PE/DVT  on anticoagulation with Coumadin . EKG without ACS.  ContinueCoumadin and Cardizem    Hypertension BP 125/82  Pulse 83   Controlled Continue home anti-hypertensive medications   Hyperlipidemia Continue home statins   Type II Diabetes Current blood sugar level is 202 Lab Results  Component Value Date   HGBA1C 7.1 (H) 07/02/2015   Hgb A1C Hold home oral diabetic medications.   SSI  Hypokalemia, may be due to diuretics  Current K  3.3  Oral replenishment with 40 kdur at the ED  Check Mg Repeat CMET in am  Chronic kidney disease stage    baseline creatinine  1   Lab Results  Component Value Date   CREATININE 1.10 08/18/2016   CREATININE 1.16 06/12/2016   CREATININE 1.04 06/11/2016   Monitor closely  Repeat CMET in am   Hypothyroidism: Continue home Synthroid Check TSH      DVT prophylaxis:   Coumadin   Code Status:   Full   Family Communication:  Discussed with patient Disposition Plan:  Expect patient to be discharged to home after condition improves Consults called:    Cardiology in am  Admission status t SDU    [place], PA-C [facility]   08/18/2016, 5:19 PM

## 2016-08-18 NOTE — Progress Notes (Signed)
ANTICOAGULATION CONSULT NOTE - Initial Consult  Pharmacy Consult for warfarin Indication: atrial fibrillation  No Known Allergies  Patient Measurements:    Vital Signs: Temp: 97.3 F (36.3 C) (06/01 1236) Temp Source: Oral (06/01 1236) BP: 125/82 (06/01 1643) Pulse Rate: 83 (06/01 1643)  Labs:  Recent Labs  08/18/16 1247  HGB 14.9  HCT 44.5  PLT 405*  LABPROT 28.2*  INR 2.58  CREATININE 1.10    CrCl cannot be calculated (Unknown ideal weight.).   Medical History: Past Medical History:  Diagnosis Date  . ASCVD (arteriosclerotic cardiovascular disease)   . Cancer [facility]) 2009   Upper lobectomy;radiation; chemotherapy  . Chronic systolic CHF (congestive heart failure) ([place])   . CKD (chronic kidney disease), stage II   . COPD (chronic obstructive pulmonary disease) ([place])   . Coronary artery disease   . Diabetes mellitus   . GERD (gastroesophageal reflux disease)   . Hyperlipidemia   . Hypertension   . Leukocytosis 07/12/2015  . PAF (paroxysmal atrial fibrillation) (HCC)    Chronic warfarin therapy  . Primary cancer of right upper lobe of lung ([place]) 06/30/2013   RUL lobectomy 2009 with chest wall resection for superior sulcus tumor Postoperative radiation and chemotherapy; Dr Earlie Server Oncology monitor [phone]  . Pulmonary embolism (HCC)    PMH of  . Thyroid disease      Assessment: 9 YOM here with fatigue and peripheral edema for a few days. History of AFib and PE on warfarin- home dose 7.5mg  daily except 5mg  on Sundays and Thursdays. Admit INR in therapeutic range at 2.58. Hgb 14.9, plts 405- no bleeding noted.  Patient took warfarin this morning.  Goal of Therapy:  INR 2-3 Monitor platelets by anticoagulation protocol: Yes   Plan:  Continue home dose of warfarin- 7.5mg  daily except 5mg  on Sundays and Thursdays Daily INR for now Follow s/s bleeding  Justin Garner D. [place], PharmD, [place] Clinical Pharmacist [phone] 08/18/2016 5:09 PM

## 2016-08-18 NOTE — ED Notes (Signed)
Paged admitting about duplicate potassium order.

## 2016-08-18 NOTE — Progress Notes (Signed)
Ceftriaxone for upper respiratory infection per pharmacy ordered.  Ceftriaxone does not need renal adjustment.  [employer] policy allows pharmacy to change the ordered dose based on indication without contacting the provider, therefore a consult is not required.  Plan: -ceftriaxone 1g IV q24h -pharmacy to sign off as no adjustment needed.   Jazzlene Huot D. Kaushal Vannice, PharmD, [place] Clinical Pharmacist [phone] 08/18/2016 5:03 PM

## 2016-08-18 NOTE — ED Triage Notes (Signed)
Pt presents with c/o fatigue. The fatigue began last weekend and has been progressively worse over the course of this week. His wife says that he had to sit down during his normal activities this week because he's been so tired. He reports weakness, tachycardia, shortness of breath, cough, bilateral lower extremity edema. He denies fevers, chest pain.

## 2016-08-18 NOTE — Telephone Encounter (Signed)
Spoke with patient's wife and she states that the patient's ankles and legs are swollen and that he has gained 6 lbs since yesterday. She states that he is SOB. Patient takes furosemide 20 mg QD. She also states that the patient's HR has been in the low 100s. Patient has history of Afib and takes diltiazem 240 mg QD. The patient also takes metoprolol 25 mg BID that was recently increased for rate control. The wife states that her husband is very tired, has decreased urine, and is very confused. She is requesting an appointment today. Will discuss with DOD.

## 2016-08-18 NOTE — Telephone Encounter (Signed)
Discussed with Dr. Harrington Challenger (Covering DOD at this time). Given the patient's symptoms, the need to be diuresed, and new onset of confusion the patient needs to be seen in the ER. [name] made aware of her recommendations. Wife verbalized understanding and was in agreement with this plan.

## 2016-08-18 NOTE — ED Provider Notes (Signed)
[facility] DEPT Provider Note   CSN: [id number] Arrival date & time: 08/18/16  1232     History   Chief Complaint Chief Complaint  Patient presents with  . Fatigue    HPI Justin Garner is a 83 y.o. male.  HPI   Level V caveat due to mental status change.  Justin Garner is a 83 y.o. male, with a history of CAD, COPD, lung cancer, HTN, PAF, PE, presenting to the ED with fatigue and peripheral edema worsening over last few days.  Sleeping more than normal. Decreased activity and appetite. Confusion such as not knowing his birthday. Dark colored urine and decreased urine over last few days. Diarrhea last night. Intermittent shortness of breath.   Wife called cardiologist to get him an appointment, but they didn't have openings and told patient to come to ED.   Patient and wife deny fever/chills, vomiting, cough, falls/trauma, or any other complaints.   Cardiologist: Irish Lack  Past Medical History:  Diagnosis Date  . ASCVD (arteriosclerotic cardiovascular disease)   . Cancer [facility]) 2009   Upper lobectomy;radiation; chemotherapy  . Chronic systolic CHF (congestive heart failure) ([place])   . CKD (chronic kidney disease), stage II   . COPD (chronic obstructive pulmonary disease) ([place])   . Coronary artery disease   . Diabetes mellitus   . GERD (gastroesophageal reflux disease)   . Hyperlipidemia   . Hypertension   . Leukocytosis 07/12/2015  . PAF (paroxysmal atrial fibrillation) (HCC)    Chronic warfarin therapy  . Primary cancer of right upper lobe of lung ([place]) 06/30/2013   RUL lobectomy 2009 with chest wall resection for superior sulcus tumor Postoperative radiation and chemotherapy; Dr Earlie Server Oncology monitor [phone]  . Pulmonary embolism (HCC)    PMH of  . Thyroid disease     Patient Active Problem List   Diagnosis Date Noted  . Atrial fibrillation with RVR ([place]) 06/09/2016  . Acute encephalopathy 06/09/2016  . COPD exacerbation ([place]) 11/15/2015  .  Acute on chronic kidney failure-II 11/15/2015  . Malnutrition of moderate degree 11/15/2015  . Leukocytosis 07/12/2015  . Chronic obstructive pulmonary disease ([place])   . Chronic diastolic CHF (congestive heart failure) ([place])   . Cardiomyopathy ([place])   . CAD in native artery   . Hypomagnesemia   . CAP (community acquired pneumonia) 06/13/2015  . Acute on chronic respiratory failure with hypoxia ([place]) 06/13/2015  . Hypothyroidism 06/13/2015  . HLD (hyperlipidemia) 06/13/2015  . Chronic systolic CHF (congestive heart failure) ([place])   . Protein-calorie malnutrition, severe ([place]) 12/29/2013  . Hypokalemia 12/28/2013  . Sepsis ([place]) 12/28/2013  . 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 ([place]) 12/28/2013  . Acute systolic heart failure ([place]) 08/12/2013  . Primary cancer of right upper lobe of lung ([place]) 06/30/2013  . Coronary atherosclerosis of native coronary artery 05/28/2013  . Essential hypertension, benign 05/28/2013  . Chronic atrial fibrillation ([place]) 12/24/2012  . Pulmonary embolism ([place]) 12/24/2012  . COPD with acute bronchitis ([place]) 04/23/2008    Past Surgical History:  Procedure Laterality Date  . CAROTID STENT    . CHOLECYSTECTOMY    . FIBEROPTIC BRONCHOSCOPY AND MEIASTINOSCOPY  06/07/2007  . HERNIA REPAIR    . INGUINAL HERNIA REPAIR  10/10/2010  . LEFT HEART CATHETERIZATION WITH CORONARY ANGIOGRAM N/A 08/19/2013   Procedure: LEFT HEART CATHETERIZATION WITH CORONARY ANGIOGRAM;  Surgeon: Jettie Booze, MD;  Location: [facility] CATH LAB;  Service: Cardiovascular;  Laterality: N/A;  . R VATS,THORACOTOMY AND UPPER LOBECTOMY  08/02/2007  Home Medications    Prior to Admission medications   Medication Sig Start Date End Date Taking? Authorizing Provider  albuterol (PROVENTIL HFA;VENTOLIN HFA) 108 (90 Base) MCG/ACT inhaler Inhale 2 puffs into the lungs every 6 (six) hours as needed for wheezing or shortness of breath. 11/16/15   Mikhail, Velta Addison, DO  Amino Acids-Protein Hydrolys  (FEEDING SUPPLEMENT, PRO-STAT SUGAR FREE 64,) LIQD Take 30 mLs by mouth 2 (two) times daily with a meal. 11/16/15   Mikhail, Velta Addison, DO  atorvastatin (LIPITOR) 20 MG tablet Take 1 tablet (20 mg total) by mouth daily. 08/06/15   Jettie Booze, MD  budesonide-formoterol [facility]) 80-4.5 MCG/ACT inhaler Inhale 2 puffs into the lungs every morning. 11/16/15   Cristal Ford, DO  dextromethorphan-guaiFENesin [facility] DM) 30-600 MG 12hr tablet Take 1 tablet by mouth 2 (two) times daily as needed for cough. 11/16/15   Mikhail, Velta Addison, DO  diltiazem (CARDIZEM CD) 240 MG 24 hr capsule Take 1 capsule (240 mg total) by mouth daily. 08/09/15   Jettie Booze, MD  fish oil-omega-3 fatty acids 1000 MG capsule Take 1 g by mouth daily.     [provider]  furosemide (LASIX) 20 MG tablet Take 20 mg by mouth daily.     [provider]  glipiZIDE (GLUCOTROL) 10 MG tablet Take 10 mg by mouth daily. 08/09/15   [provider]  levofloxacin (LEVAQUIN) 500 MG tablet Take 1 tablet (500 mg total) by mouth daily. 06/13/16   Patrecia Pour, MD  levothyroxine (SYNTHROID, LEVOTHROID) 112 MCG tablet Take 112 mcg by mouth daily.      [provider]  metoprolol tartrate (LOPRESSOR) 25 MG tablet TAKE 1/2 TABLET BY MOUTH TWICE A DAY Patient taking differently: TAKE 1 TABLET BY MOUTH TWICE A DAY 07/11/16   Jettie Booze, MD  nitroGLYCERIN (NITROSTAT) 0.4 MG SL tablet Place 1 tablet (0.4 mg total) under the tongue every 5 (five) minutes as needed for chest pain up to 3 doses 08/03/16   Jettie Booze, MD  omeprazole (PRILOSEC) 20 MG capsule Take 20 mg by mouth daily.      [provider]  warfarin (COUMADIN) 5 MG tablet TAKE AS DIRECTED BY  COUMADIN CLINIC 07/31/16   Jettie Booze, MD    Family History Family History  Problem Relation Age of Onset  . Heart disease Father        Heart attack  . Heart attack Father   . Diabetes Brother   . Cancer Neg Hx     . Stroke Neg Hx   . Hypertension Neg Hx     Social History Social History  Substance Use Topics  . Smoking status: Former Smoker    Packs/day: 1.00    Years: 50.00    Types: Cigarettes    Quit date: 03/20/2001  . Smokeless tobacco: Former Systems developer  . Alcohol use No     Allergies   Patient has no known allergies.   Review of Systems Review of Systems  Constitutional: Positive for activity change, appetite change and fatigue. Negative for chills, diaphoresis and fever.  Respiratory: Positive for shortness of breath (intermittent). Negative for cough.   Cardiovascular: Negative for chest pain.  Gastrointestinal: Positive for diarrhea. Negative for abdominal pain, nausea and vomiting.  Genitourinary: Positive for decreased urine volume.  All other systems reviewed and are negative.    Physical Exam Updated Vital Signs BP 120/78   Pulse 75   Temp 97.3 F (36.3 C) (Oral)  Resp (!) 24   SpO2 97%   Physical Exam  Constitutional: He is oriented to person, place, and time. He appears well-developed and well-nourished. No distress.  HENT:  Head: Normocephalic and atraumatic.  Mouth/Throat: Mucous membranes are dry.  Eyes: Conjunctivae and EOM are normal. Pupils are equal, round, and reactive to light.  Neck: Neck supple.  Cardiovascular: Normal rate, normal heart sounds and intact distal pulses.  A regularly irregular rhythm present.  Pulmonary/Chest: Effort normal and breath sounds normal. No respiratory distress.  Upper right lobe surgically absent.   Abdominal: Soft. There is no tenderness. There is no guarding.  Musculoskeletal: He exhibits edema (bilateral, pitting). He exhibits no tenderness.  Normal motor function intact in all extremities and spine. No midline spinal tenderness.   Lymphadenopathy:    He has no cervical adenopathy.  Neurological: He is alert and oriented to person, place, and time.  No sensory deficits. Strength 5/5 in all extremities. Coordination  intact. Cranial nerves III-XII grossly intact. No facial droop.   Skin: Skin is warm and dry. He is not diaphoretic.  Psychiatric: He has a normal mood and affect. His behavior is normal.  Nursing note and vitals reviewed.    ED Treatments / Results  Labs (all labs ordered are listed, but only abnormal results are displayed) Labs Reviewed  BASIC METABOLIC PANEL - Abnormal; Notable for the following:       Result Value   Potassium 3.3 (*)    Glucose, Bld 202 (*)    Calcium 8.5 (*)    All other components within normal limits  CBC - Abnormal; Notable for the following:    WBC 12.9 (*)    RDW 16.1 (*)    Platelets 405 (*)    All other components within normal limits  PROTIME-INR - Abnormal; Notable for the following:    Prothrombin Time 28.2 (*)    All other components within normal limits  URINALYSIS, ROUTINE W REFLEX MICROSCOPIC - Abnormal; Notable for the following:    Protein, ur 30 (*)    Squamous Epithelial / LPF 0-5 (*)    All other components within normal limits  BRAIN NATRIURETIC PEPTIDE - Abnormal; Notable for the following:    B Natriuretic Peptide 356.4 (*)    All other components within normal limits  RAPID URINE DRUG SCREEN, HOSP PERFORMED  TSH  I-STAT TROPOININ, ED    EKG  EKG Interpretation  Date/Time:  Friday August 18 2016 12:41:00 EDT Ventricular Rate:  99 PR Interval:    QRS Duration: 98 QT Interval:  392 QTC Calculation: 503 R Axis:   98 Text Interpretation:  Atrial fibrillation Rightward axis Incomplete right bundle branch block Anterior infarct , age undetermined Prolonged QT Abnormal ECG No significant change since last tracing Confirmed by Hardwick ([id number]) on 08/18/2016 1:36:29 PM       Radiology Dg Chest 2 View  Result Date: 08/18/2016 CLINICAL DATA:  Short of breath and cough EXAM: CHEST  2 VIEW COMPARISON:  06/09/2016.  09/14/2015. FINDINGS: The heart remains markedly enlarged. Course chronic parenchymal changes throughout both  lungs is stable. Pleural thickening at the right base is stable. Airspace disease seen on the recent chest x-ray a in the medial right middle and lower lobes has resolved appear no pneumothorax. Bullous changes at the left apex. Osteopenia. IMPRESSION: Resolved right medial lower airspace disease. Bilateral chronic fibrotic changes persist. Electronically Signed   By: Marybelle Killings M.D.   On: 08/18/2016 13:15  Procedures Procedures (including critical care time)  Medications Ordered in ED Medications  potassium chloride SA (K-DUR,KLOR-CON) CR tablet 40 mEq (40 mEq Oral Given 08/18/16 1556)     Initial Impression / Assessment and Plan / ED Course  I have reviewed the triage vital signs and the nursing notes.  Pertinent labs & imaging results that were available during my care of the patient were reviewed by me and considered in my medical decision making (see chart for details).  Clinical Course as of Aug 18 1629  Fri Aug 18, 2016  1629 Spoke with Sharene Butters, with [facility] who agree to admit the patient.   [SJ]    Clinical Course User Index [SJ] Evia Goldsmith C, PA-C     Patient presents with signs and symptoms of fluid overload. Tachypnea and drop in SPO2 down to 88% with exertion. Requires supplemental O2 to recover. Patient will likely need with fluids and Lasix. Admission for new oxygen requirement and management of IV fluids and Lasix. CT PE study will be performed by admitting team. Patient and his wife agree to plan.   Findings and plan of care discussed with Zenovia Jarred, MD. Dr. Thomasene Lot personally evaluated and examined this patient.  Vitals:   08/18/16 1500 08/18/16 1530 08/18/16 1540 08/18/16 1600  BP: 115/84 (!) 122/92 (!) 122/92 (!) 131/97  Pulse: 89 75 83 80  Resp: 17 (!) 22 (!) 33 (!) 24  Temp:      TempSrc:      SpO2: 94% 93% 94% 95%     Final Clinical Impressions(s) / ED Diagnoses   Final diagnoses:  Shortness of breath    New  Prescriptions New Prescriptions   No medications on file     Layla Maw 08/18/16 1633    [place], Fredia Sorrow, MD 08/22/16 1529

## 2016-08-18 NOTE — ED Notes (Signed)
Patient transported to CT 

## 2016-08-19 ENCOUNTER — Observation Stay (HOSPITAL_COMMUNITY): Payer: Medicare Other

## 2016-08-19 DIAGNOSIS — I5043 Acute on chronic combined systolic (congestive) and diastolic (congestive) heart failure: Secondary | ICD-10-CM | POA: Diagnosis present

## 2016-08-19 DIAGNOSIS — J209 Acute bronchitis, unspecified: Secondary | ICD-10-CM | POA: Diagnosis present

## 2016-08-19 DIAGNOSIS — Z9221 Personal history of antineoplastic chemotherapy: Secondary | ICD-10-CM | POA: Diagnosis not present

## 2016-08-19 DIAGNOSIS — E785 Hyperlipidemia, unspecified: Secondary | ICD-10-CM | POA: Diagnosis present

## 2016-08-19 DIAGNOSIS — E1122 Type 2 diabetes mellitus with diabetic chronic kidney disease: Secondary | ICD-10-CM | POA: Diagnosis present

## 2016-08-19 DIAGNOSIS — I5021 Acute systolic (congestive) heart failure: Secondary | ICD-10-CM | POA: Diagnosis not present

## 2016-08-19 DIAGNOSIS — E876 Hypokalemia: Secondary | ICD-10-CM | POA: Diagnosis present

## 2016-08-19 DIAGNOSIS — I10 Essential (primary) hypertension: Secondary | ICD-10-CM | POA: Diagnosis not present

## 2016-08-19 DIAGNOSIS — I071 Rheumatic tricuspid insufficiency: Secondary | ICD-10-CM | POA: Diagnosis present

## 2016-08-19 DIAGNOSIS — J9621 Acute and chronic respiratory failure with hypoxia: Secondary | ICD-10-CM | POA: Diagnosis present

## 2016-08-19 DIAGNOSIS — I4891 Unspecified atrial fibrillation: Secondary | ICD-10-CM | POA: Diagnosis not present

## 2016-08-19 DIAGNOSIS — I509 Heart failure, unspecified: Secondary | ICD-10-CM | POA: Diagnosis not present

## 2016-08-19 DIAGNOSIS — I2510 Atherosclerotic heart disease of native coronary artery without angina pectoris: Secondary | ICD-10-CM | POA: Diagnosis present

## 2016-08-19 DIAGNOSIS — J441 Chronic obstructive pulmonary disease with (acute) exacerbation: Secondary | ICD-10-CM | POA: Diagnosis present

## 2016-08-19 DIAGNOSIS — R0602 Shortness of breath: Secondary | ICD-10-CM | POA: Diagnosis present

## 2016-08-19 DIAGNOSIS — J440 Chronic obstructive pulmonary disease with acute lower respiratory infection: Secondary | ICD-10-CM | POA: Diagnosis present

## 2016-08-19 DIAGNOSIS — E039 Hypothyroidism, unspecified: Secondary | ICD-10-CM | POA: Diagnosis present

## 2016-08-19 DIAGNOSIS — J9601 Acute respiratory failure with hypoxia: Secondary | ICD-10-CM

## 2016-08-19 DIAGNOSIS — Z86711 Personal history of pulmonary embolism: Secondary | ICD-10-CM | POA: Diagnosis not present

## 2016-08-19 DIAGNOSIS — I5033 Acute on chronic diastolic (congestive) heart failure: Secondary | ICD-10-CM | POA: Diagnosis not present

## 2016-08-19 DIAGNOSIS — N182 Chronic kidney disease, stage 2 (mild): Secondary | ICD-10-CM | POA: Diagnosis present

## 2016-08-19 DIAGNOSIS — K219 Gastro-esophageal reflux disease without esophagitis: Secondary | ICD-10-CM | POA: Diagnosis present

## 2016-08-19 DIAGNOSIS — Z902 Acquired absence of lung [part of]: Secondary | ICD-10-CM | POA: Diagnosis not present

## 2016-08-19 DIAGNOSIS — Z923 Personal history of irradiation: Secondary | ICD-10-CM | POA: Diagnosis not present

## 2016-08-19 DIAGNOSIS — E1165 Type 2 diabetes mellitus with hyperglycemia: Secondary | ICD-10-CM | POA: Diagnosis present

## 2016-08-19 DIAGNOSIS — I130 Hypertensive heart and chronic kidney disease with heart failure and stage 1 through stage 4 chronic kidney disease, or unspecified chronic kidney disease: Secondary | ICD-10-CM | POA: Diagnosis present

## 2016-08-19 DIAGNOSIS — Z7901 Long term (current) use of anticoagulants: Secondary | ICD-10-CM | POA: Diagnosis not present

## 2016-08-19 DIAGNOSIS — I482 Chronic atrial fibrillation: Secondary | ICD-10-CM | POA: Diagnosis not present

## 2016-08-19 DIAGNOSIS — I429 Cardiomyopathy, unspecified: Secondary | ICD-10-CM | POA: Diagnosis present

## 2016-08-19 DIAGNOSIS — Z79899 Other long term (current) drug therapy: Secondary | ICD-10-CM | POA: Diagnosis not present

## 2016-08-19 LAB — CBC
HEMATOCRIT: 45.1 % (ref 39.0–52.0)
HEMOGLOBIN: 15 g/dL (ref 13.0–17.0)
MCH: 30.2 pg (ref 26.0–34.0)
MCHC: 33.3 g/dL (ref 30.0–36.0)
MCV: 90.7 fL (ref 78.0–100.0)
Platelets: 418 10*3/uL — ABNORMAL HIGH (ref 150–400)
RBC: 4.97 MIL/uL (ref 4.22–5.81)
RDW: 16.4 % — ABNORMAL HIGH (ref 11.5–15.5)
WBC: 13 10*3/uL — AB (ref 4.0–10.5)

## 2016-08-19 LAB — GLUCOSE, CAPILLARY
GLUCOSE-CAPILLARY: 237 mg/dL — AB (ref 65–99)
GLUCOSE-CAPILLARY: 270 mg/dL — AB (ref 65–99)
GLUCOSE-CAPILLARY: 138 mg/dL — AB (ref 65–99)
GLUCOSE-CAPILLARY: 209 mg/dL — AB (ref 65–99)

## 2016-08-19 LAB — BLOOD GAS, ARTERIAL
ACID-BASE EXCESS: 3.1 mmol/L — AB (ref 0.0–2.0)
BICARBONATE: 25.8 mmol/L (ref 20.0–28.0)
Drawn by: 270221
O2 Content: 6 L/min
O2 Saturation: 90.1 %
PCO2 ART: 30.5 mmHg — AB (ref 32.0–48.0)
PH ART: 7.537 — AB (ref 7.350–7.450)
PO2 ART: 56.7 mmHg — AB (ref 83.0–108.0)
Patient temperature: 98.6

## 2016-08-19 LAB — COMPREHENSIVE METABOLIC PANEL
ALT: 27 U/L (ref 17–63)
ANION GAP: 12 (ref 5–15)
AST: 27 U/L (ref 15–41)
Albumin: 2.5 g/dL — ABNORMAL LOW (ref 3.5–5.0)
Alkaline Phosphatase: 212 U/L — ABNORMAL HIGH (ref 38–126)
BUN: 17 mg/dL (ref 6–20)
CHLORIDE: 101 mmol/L (ref 101–111)
CO2: 25 mmol/L (ref 22–32)
Calcium: 8.3 mg/dL — ABNORMAL LOW (ref 8.9–10.3)
Creatinine, Ser: 1.03 mg/dL (ref 0.61–1.24)
Glucose, Bld: 230 mg/dL — ABNORMAL HIGH (ref 65–99)
Potassium: 3.4 mmol/L — ABNORMAL LOW (ref 3.5–5.1)
Sodium: 138 mmol/L (ref 135–145)
Total Bilirubin: 1.7 mg/dL — ABNORMAL HIGH (ref 0.3–1.2)
Total Protein: 5.8 g/dL — ABNORMAL LOW (ref 6.5–8.1)

## 2016-08-19 LAB — PROTIME-INR
INR: 2.66
Prothrombin Time: 28.8 seconds — ABNORMAL HIGH (ref 11.4–15.2)

## 2016-08-19 LAB — MAGNESIUM: MAGNESIUM: 1.2 mg/dL — AB (ref 1.7–2.4)

## 2016-08-19 LAB — TROPONIN I

## 2016-08-19 MED ORDER — POTASSIUM CHLORIDE CRYS ER 20 MEQ PO TBCR: 40.0000 meq | EXTENDED_RELEASE_TABLET | Freq: Once | ORAL | Status: DC

## 2016-08-19 MED ORDER — MAGNESIUM SULFATE 2 GM/50ML IV SOLN
2.0000 g | Freq: Once | INTRAVENOUS | Status: AC
  Administered 2016-08-19: 2 g via INTRAVENOUS
  Filled 2016-08-19: qty 50

## 2016-08-19 MED ORDER — METOPROLOL TARTRATE 50 MG PO TABS
50.0000 mg | ORAL_TABLET | Freq: Two times a day (BID) | ORAL | Status: DC
  Administered 2016-08-19 – 2016-08-20 (×4): 50 mg via ORAL
  Filled 2016-08-19 (×4): qty 1

## 2016-08-19 MED ORDER — POTASSIUM CHLORIDE CRYS ER 20 MEQ PO TBCR
30.0000 meq | EXTENDED_RELEASE_TABLET | Freq: Once | ORAL | Status: AC
  Administered 2016-08-19: 30 meq via ORAL
  Filled 2016-08-19: qty 1

## 2016-08-19 MED ORDER — METOPROLOL TARTRATE 5 MG/5ML IV SOLN
5.0000 mg | Freq: Once | INTRAVENOUS | Status: AC
  Administered 2016-08-19: 5 mg via INTRAVENOUS
  Filled 2016-08-19: qty 5

## 2016-08-19 MED ORDER — DILTIAZEM HCL ER COATED BEADS 180 MG PO CP24
300.0000 mg | ORAL_CAPSULE | Freq: Every day | ORAL | Status: DC
  Administered 2016-08-19 – 2016-08-20 (×2): 300 mg via ORAL
  Filled 2016-08-19 (×2): qty 1

## 2016-08-19 MED ORDER — DILTIAZEM HCL 25 MG/5ML IV SOLN
10.0000 mg | Freq: Once | INTRAVENOUS | Status: AC
  Administered 2016-08-19: 10 mg via INTRAVENOUS
  Filled 2016-08-19: qty 5

## 2016-08-19 MED ORDER — DILTIAZEM LOAD VIA INFUSION: 10.0000 mg | Freq: Once | INTRAVENOUS | Status: DC

## 2016-08-19 NOTE — Progress Notes (Signed)
Pt now on HFNC at 7L. Pt resting in bed and denies SOB. Wife at bedside.

## 2016-08-19 NOTE — Progress Notes (Signed)
PROGRESS NOTE                                                                                                                                                                                                             Patient Demographics:    Justin Garner, is a 83 y.o. male, DOB - May 06, 1932, JTT:[id number]  Admit date - 08/18/2016   Admitting Physician Reyne Dumas, MD  Outpatient Primary MD for the patient is [name], MD  LOS - 0  Outpatient Specialists:  Dr Irish Lack  Chief Complaint  Patient presents with  . Fatigue       Brief Narrative   83 year old male with history of chronic systolic CHF, chronic kidney disease stage II, COPD not on home O2, diabetes mellitus, paroxysmal A. fib on Coumadin, primary right upper lobe lung cancer status post resection followed by postoperative radiation and chemotherapy, history of PE, hypothyroidism, hypertension who was brought to the ED by his wife for increased fatigue, lethargic and somnolence. Patient also had poor appetite and was minimally active. He was also found to be increasingly short of breath without any complaints of chest pain, fevers, chills, recent illness or travel. Patient reported being compliant with his medications. In the ED he had stable vitals, labs showed a potassium of 3.3, BNP of 346 glucose of 202. Chest x-ray showed bilateral chronic fibrotic changes. His O2 sat dropped to 88% on room air.  ABG showed pH of 7.5, PCO2 of 31 and PO2 of 71.  Patient admitted to telemetry for acute on chronic diastolic CHF with hypoxic respiratory failure. Overnight patient went into rapid A. fib and increased oxygen requirement.   Subjective:   Patient the setting to 80s on 4 L via nasal cannula, placed on an RB for about 30 minutes and then transitioned to 6 L with stable O2 sat in low 90s. Heart rate ranging from 100-120s.   Assessment  & Plan :    Principal problem Acute respiratory failure with hypoxia ( HCC) Possibly due to acute on chronic diastolic CHF and? COPD exacerbation. Currently on 6 L via nasal cannula. Check ABG. Continue IV Lasix 60 mg twice a day. Transitioned to stepdown level of care. Atrial fibrillation with RVR. Strict I/O and daily weight. Check 2-D echo.  Cardiology consulted.  Active Problems: Atrial fibrillation with RVR (HCC) Heart  rate ranging from 100-120s. Increase Cardizem dose to 300 mg daily and metoprolol 50 mg twice a day. Continue Coumadin. (INR therapeutic) Replenished low potassium and magnesium.    COPD with acute bronchitis ([place]) When necessary meds. Empiric Rocephin and azithromycin. Antitussives when necessary.     Coronary atherosclerosis of native coronary artery Continue beta blocker and statin.    Essential hypertension, benign Stable. Home medications adjusted.    Primary cancer of right upper lobe of lung ([place]) Status post lobectomy and postsurgical radiation and chemotherapy in 2009. Currently being monitored by Dr. Julien Nordmann.    Hypokalemia/hypomagnesemia Replenished    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 (HCC) Monitor on sliding scale coverage.     Hypothyroidism Continue Synthroid.    1.    Code Status : Full code  Family Communication  : Wife at bedside  Disposition Plan  : Transition to stepdown level of care  Barriers For Discharge : Active symptoms  Consults  :   Cardiology  Procedures  :  2-D echo  DVT Prophylaxis  :  Coumadin  Lab Results  Component Value Date   PLT 418 (H) 08/19/2016    Antibiotics  :    Anti-infectives    Start     Dose/Rate Route Frequency Ordered Stop   08/18/16 1730  cefTRIAXone (ROCEPHIN) 1 g in dextrose 5 % 50 mL IVPB     1 g 100 mL/hr over 30 Minutes Intravenous Every 24 hours 08/18/16 1704     08/18/16 1715  azithromycin (ZITHROMAX) 500 mg in dextrose 5 % 250 mL IVPB     500 mg 250 mL/hr over 60 Minutes Intravenous  Every 24 hours 08/18/16 1702          Objective:   Vitals:   08/19/16 0500 08/19/16 0737 08/19/16 0904 08/19/16 0911  BP: (!) 144/114 118/90    Pulse: 93 77    Resp: (!) 26 (!) 34    Temp:  98.6 F (37 C)    TempSrc:  Oral    SpO2: (!) 88% (!) 83% 99% 94%  Weight:      Height:        Wt Readings from Last 3 Encounters:  08/19/16 82.7 kg (182 lb 4.8 oz)  08/03/16 86.5 kg (190 lb 12.8 oz)  06/10/16 86.1 kg (189 lb 13.1 oz)     Intake/Output Summary (Last 24 hours) at 08/19/16 0938 Last data filed at 08/19/16 0558  Gross per 24 hour  Intake              370 ml  Output             3675 ml  Net            -3305 ml     Physical Exam  Gen: Elderly male not in distress but somnolent, easily arousable HEENT: No pallor, jvd+, moist mucosa, supple neck Chest: Diminished bilateral breath sounds, no rhonchi, wheeze or crackles CVS: S1 and S2 irregularly irregular, no murmurs rub or gallop GI: soft, NT, ND, BS+ Musculoskeletal: warm, no edema CNS: Sleepy but arousable, oriented 2    Data Review:    CBC  Recent Labs Lab 08/18/16 1247 08/19/16 0507  WBC 12.9* 13.0*  HGB 14.9 15.0  HCT 44.5 45.1  PLT 405* 418*  MCV 89.7 90.7  MCH 30.0 30.2  MCHC 33.5 33.3  RDW 16.1* 16.4*    Chemistries   Recent Labs Lab 08/18/16 1247 08/18/16 2358 08/19/16 0507  NA  137  --  138  K 3.3*  --  3.4*  CL 101  --  101  CO2 27  --  25  GLUCOSE 202*  --  230*  BUN 17  --  17  CREATININE 1.10  --  1.03  CALCIUM 8.5*  --  8.3*  MG  --  1.2*  --   AST  --   --  27  ALT  --   --  27  ALKPHOS  --   --  212*  BILITOT  --   --  1.7*   ------------------------------------------------------------------------------------------------------------------ No results for input(s): CHOL, HDL, LDLCALC, TRIG, CHOLHDL, LDLDIRECT in the last 72 hours.  Lab Results  Component Value Date   HGBA1C 7.1 (H) 07/02/2015    ------------------------------------------------------------------------------------------------------------------  Recent Labs  08/18/16 1637  TSH 2.180   ------------------------------------------------------------------------------------------------------------------ No results for input(s): VITAMINB12, FOLATE, FERRITIN, TIBC, IRON, RETICCTPCT in the last 72 hours.  Coagulation profile  Recent Labs Lab 08/18/16 1247 08/19/16 0507  INR 2.58 2.66    No results for input(s): DDIMER in the last 72 hours.  Cardiac Enzymes  Recent Labs Lab 08/18/16 1740 08/18/16 2358 08/19/16 0507  TROPONINI 0.03* <0.03 <0.03   ------------------------------------------------------------------------------------------------------------------    Component Value Date/Time   BNP 356.4 (H) 08/18/2016 1428    Inpatient Medications  Scheduled Meds: . atorvastatin  20 mg Oral Daily  . diltiazem  300 mg Oral Daily  . furosemide  60 mg Intravenous BID  . insulin aspart  0-9 Units Subcutaneous TID WC  . levothyroxine  112 mcg Oral QAC breakfast  . metoprolol tartrate  50 mg Oral BID  . mometasone-formoterol  2 puff Inhalation BID  . pantoprazole  40 mg Oral Daily  . potassium chloride  40 mEq Oral Once  . sodium chloride flush  3 mL Intravenous Q12H  . [START ON 08/20/2016] warfarin  5 mg Oral Once per day on Sun Thu  . warfarin  7.5 mg Oral Once per day on Mon Tue Wed Fri Sat  . Warfarin - Pharmacist Dosing Inpatient   Oral q1800   Continuous Infusions: . sodium chloride    . azithromycin Stopped (08/19/16 0012)  . cefTRIAXone (ROCEPHIN)  IV Stopped (08/18/16 1831)   PRN Meds:.sodium chloride, acetaminophen **OR** acetaminophen, albuterol, bisacodyl, dextromethorphan-guaiFENesin, ondansetron **OR** ondansetron (ZOFRAN) IV, senna-docusate, sodium chloride flush  Micro Results No results found for this or any previous visit (from the past 240 hour(s)).  Radiology Reports Dg Chest 2  View  Result Date: 08/18/2016 CLINICAL DATA:  Short of breath and cough EXAM: CHEST  2 VIEW COMPARISON:  06/09/2016.  09/14/2015. FINDINGS: The heart remains markedly enlarged. Course chronic parenchymal changes throughout both lungs is stable. Pleural thickening at the right base is stable. Airspace disease seen on the recent chest x-ray a in the medial right middle and lower lobes has resolved appear no pneumothorax. Bullous changes at the left apex. Osteopenia. IMPRESSION: Resolved right medial lower airspace disease. Bilateral chronic fibrotic changes persist. Electronically Signed   By: Marybelle Killings M.D.   On: 08/18/2016 13:15    Time Spent in minutes 35   Louellen Molder M.D on 08/19/2016 at 9:38 AM  Between 7am to 7pm - Pager - [phone]  After 7pm go to [email or web address] - password [facility]  [facility] -  Office  [phone]

## 2016-08-19 NOTE — Progress Notes (Signed)
ANTICOAGULATION CONSULT NOTE - Follow Up Consult  Pharmacy Consult for warfarin Indication: atrial fibrillation  No Known Allergies  Patient Measurements: Height: 6\' 2"  (188 cm) Weight: 182 lb 4.8 oz (82.7 kg) IBW/kg (Calculated) : 82.2  Vital Signs: Temp: 98.6 F (37 C) (06/02 0737) Temp Source: Oral (06/02 0737) BP: 118/90 (06/02 0737) Pulse Rate: 77 (06/02 0737)  Labs:  Recent Labs  08/18/16 1247 08/18/16 1740 08/18/16 2358 08/19/16 0507  HGB 14.9  --   --  15.0  HCT 44.5  --   --  45.1  PLT 405*  --   --  418*  LABPROT 28.2*  --   --  28.8*  INR 2.58  --   --  2.66  CREATININE 1.10  --   --  1.03  TROPONINI  --  0.03* <0.03 <0.03    Estimated Creatinine Clearance: 64.3 mL/min (by C-G formula based on SCr of 1.03 mg/dL).   Medical History: Past Medical History:  Diagnosis Date  . ASCVD (arteriosclerotic cardiovascular disease)   . Cancer [facility]) 2009   Upper lobectomy;radiation; chemotherapy  . Chronic systolic CHF (congestive heart failure) ([place])   . CKD (chronic kidney disease), stage II   . COPD (chronic obstructive pulmonary disease) ([place])   . Coronary artery disease   . Diabetes mellitus   . GERD (gastroesophageal reflux disease)   . Hyperlipidemia   . Hypertension   . Leukocytosis 07/12/2015  . PAF (paroxysmal atrial fibrillation) (HCC)    Chronic warfarin therapy  . Primary cancer of right upper lobe of lung ([place]) 06/30/2013   RUL lobectomy 2009 with chest wall resection for superior sulcus tumor Postoperative radiation and chemotherapy; Dr Earlie Server Oncology monitor [phone]  . Pulmonary embolism (HCC)    PMH of  . Thyroid disease      Assessment: 75 YOM here with fatigue and peripheral edema for a few days. History of AFib and PE on warfarin- home dose 7.5mg  daily except 5mg  on Sundays and Thursdays.  INR within range after home dose, CBC stable with no bleeding noted. Will continue home dose  Goal of Therapy:  INR 2-3 Monitor platelets  by anticoagulation protocol: Yes   Plan:  Continue home dose of warfarin- 7.5mg  daily except 5mg  on Sundays and Thursdays Daily INR/CBC Follow s/s bleeding  Dierdre Harness, [place], PharmD Clinical Pharmacy Resident [phone] (Pager) 08/19/2016 9:50 AM

## 2016-08-19 NOTE — Evaluation (Signed)
Physical Therapy Evaluation Patient Details Name: Justin Garner MRN: [id number] DOB: [birth year] Today's Date: 08/19/2016   History of Present Illness  Pt is an 83 y.o. male with a history of hypertension, hyperlipidemia, diabetes mellitus, COPD, GERD, hypothyroidism, PAF and PE on Coumadin, dCHF, CKD-II, and lung cancer (s/p lobectomy, radiation and chemotherapy). He was admitted on 08/18/16 for acute on chronic resp failure with hypoxia, COPD/CHF exacerbation, LE edema.  Clinical Impression  Patient presents with problems listed below.  Will benefit from acute PT to maximize functional independence prior to return home with wife.  Patient will need to reach min guard level to return home safely.  Recommend f/u HHPT at d/c for continued therapy.    Follow Up Recommendations Home health PT;Supervision for mobility/OOB    Equipment Recommendations  3in1 (PT)    Recommendations for Other Services       Precautions / Restrictions Precautions Precautions: Fall Precaution Comments: Patient on HFNC O2 at 7-8 L Restrictions Weight Bearing Restrictions: No      Mobility  Bed Mobility Overal bed mobility: Needs Assistance Bed Mobility: Supine to Sit     Supine to sit: Min assist     General bed mobility comments: Assist to bring trunk to upright position.  Patient with very flexed position in sitting with forward head.  Patient can extend trunk to more upright position, but can hold for < 30 seconds at a time.  In sitting, worked on upright sitting posture.  Patient with increased DOE in sitting.  Transfers                 General transfer comment: NT - DOE  Ambulation/Gait                Stairs            Wheelchair Mobility    Modified Rankin (Stroke Patients Only)       Balance Overall balance assessment: Needs assistance Sitting-balance support: Single extremity supported;Feet supported Sitting balance-Leahy Scale: Fair                                        Pertinent Vitals/Pain Pain Assessment: No/denies pain    Home Living Family/patient expects to be discharged to:: Private residence Living Arrangements: Spouse/significant other Available Help at Discharge: Family;Available 24 hours/day Type of Home: Mobile home Home Access: Stairs to enter Entrance Stairs-Rails: Right;Left;Can reach both Entrance Stairs-Number of Steps: 3 Home Layout: One level Home Equipment: Walker - 2 wheels;Cane - single point      Prior Function Level of Independence: Independent with assistive device(s)         Comments: Patient uses cane for ambulation.  Independent in ADL's     Hand Dominance   Dominant Hand: Right    Extremity/Trunk Assessment   Upper Extremity Assessment Upper Extremity Assessment: Defer to OT evaluation    Lower Extremity Assessment Lower Extremity Assessment: Generalized weakness    Cervical / Trunk Assessment Cervical / Trunk Assessment: Kyphotic (With forward head)  Communication   Communication: HOH  Cognition Arousal/Alertness: Awake/alert Behavior During Therapy: Flat affect Overall Cognitive Status: Within Functional Limits for tasks assessed                                        General Comments  Exercises     Assessment/Plan    PT Assessment Patient needs continued PT services  PT Problem List Decreased strength;Decreased activity tolerance;Decreased balance;Decreased mobility;Decreased knowledge of use of DME;Cardiopulmonary status limiting activity       PT Treatment Interventions DME instruction;Gait training;Stair training;Functional mobility training;Therapeutic activities;Patient/family education    PT Goals (Current goals can be found in the Care Plan section)  Acute Rehab PT Goals Patient Stated Goal: To get stronger PT Goal Formulation: With patient/family Time For Goal Achievement: 08/26/16 Potential to Achieve Goals: Good     Frequency Min 3X/week   Barriers to discharge        Co-evaluation               AM-PAC PT "6 Clicks" Daily Activity  Outcome Measure Difficulty turning over in bed (including adjusting bedclothes, sheets and blankets)?: A Little Difficulty moving from lying on back to sitting on the side of the bed? : Total Difficulty sitting down on and standing up from a chair with arms (e.g., wheelchair, bedside commode, etc,.)?: Total Help needed moving to and from a bed to chair (including a wheelchair)?: A Little Help needed walking in hospital room?: A Little Help needed climbing 3-5 steps with a railing? : A Little 6 Click Score: 14    End of Session Equipment Utilized During Treatment: Oxygen (HFNC at 7L) Activity Tolerance: Patient limited by fatigue Patient left: in bed;with call bell/phone within reach;with bed alarm set;with family/visitor present (sitting EOB) Nurse Communication: Mobility status PT Visit Diagnosis: Other abnormalities of gait and mobility (R26.89);Muscle weakness (generalized) (M62.81)    Time: 8832-5498 PT Time Calculation (min) (ACUTE ONLY): 18 min   Charges:   PT Evaluation $PT Eval Moderate Complexity: 1 Procedure     PT G Codes:        Carita Pian. Sanjuana Kava, [facility] Acute Rehab Services Pager [phone]   Despina Pole 08/19/2016, 8:01 PM

## 2016-08-19 NOTE — Progress Notes (Signed)
Pt's 84 sats ranging anywhere from the low 80's to high 80's  on 4 L and 6L of 02 via nasal cannula.While resting in bed.  Pt placed on a non re breather pt now sating in the mid to high 90's. Pt denies any SOB. Wife at bedside. Will cont to monitor pt.

## 2016-08-19 NOTE — Consult Note (Signed)
Cardiology Consultation:   Patient ID: Justin Garner; [id number]; 05/05/1932   Admit date: 08/18/2016 Date of Consult: 08/19/2016  Primary Care Provider: [name], MD Primary Cardiologist: Dr. Irish Lack Primary Electrophysiologist:  None   Patient Profile:   Justin Garner is a 83 y.o. male with a hx of atrial fibrillation, and CHF who is being seen today for the evaluation of atrial fib and sob at the request of Dr. Flonnie Overman. The patient has had chronic dyspnea, CAD, and diastolic heart failure and was admitted with worsening dyspnea with exertion and peripheral edema.  History of Present Illness:   Justin Garner is an 83 yo man with the above problems. He also has a h/o COPD and Lung CA and is s/p resection and XRT in the past. The patient has had no syncope. His wife noted that he has c/o progressive dyspnea with exertion over the past few days and noted to have increased edema. He has been treated with IV diuretic therapy and his weight is down about 7 lbs. His dyspnea is improved. Has had atrial fib for over a year (review of all old ECG's) and takes chronic warfarin therapy for thromboembolic prevention. He has not c/o recurrent angina. He denies dietary or medical non-compliance.  Past Medical History:  Diagnosis Date  . ASCVD (arteriosclerotic cardiovascular disease)   . Cancer [facility]) 2009   Upper lobectomy;radiation; chemotherapy  . Chronic systolic CHF (congestive heart failure) ([place])   . CKD (chronic kidney disease), stage II   . COPD (chronic obstructive pulmonary disease) ([place])   . Coronary artery disease   . Diabetes mellitus   . GERD (gastroesophageal reflux disease)   . Hyperlipidemia   . Hypertension   . Leukocytosis 07/12/2015  . PAF (paroxysmal atrial fibrillation) (HCC)    Chronic warfarin therapy  . Primary cancer of right upper lobe of lung ([place]) 06/30/2013   RUL lobectomy 2009 with chest wall resection for superior sulcus tumor Postoperative  radiation and chemotherapy; Dr Earlie Server Oncology monitor [phone]  . Pulmonary embolism (HCC)    PMH of  . Thyroid disease     Past Surgical History:  Procedure Laterality Date  . CAROTID STENT    . CHOLECYSTECTOMY    . FIBEROPTIC BRONCHOSCOPY AND MEIASTINOSCOPY  06/07/2007  . HERNIA REPAIR    . INGUINAL HERNIA REPAIR  10/10/2010  . LEFT HEART CATHETERIZATION WITH CORONARY ANGIOGRAM N/A 08/19/2013   Procedure: LEFT HEART CATHETERIZATION WITH CORONARY ANGIOGRAM;  Surgeon: Jettie Booze, MD;  Location: [facility] CATH LAB;  Service: Cardiovascular;  Laterality: N/A;  . R VATS,THORACOTOMY AND UPPER LOBECTOMY  08/02/2007     Inpatient Medications: Scheduled Meds: . atorvastatin  20 mg Oral Daily  . diltiazem  300 mg Oral Daily  . furosemide  60 mg Intravenous BID  . insulin aspart  0-9 Units Subcutaneous TID WC  . levothyroxine  112 mcg Oral QAC breakfast  . metoprolol tartrate  50 mg Oral BID  . mometasone-formoterol  2 puff Inhalation BID  . pantoprazole  40 mg Oral Daily  . potassium chloride  40 mEq Oral Once  . sodium chloride flush  3 mL Intravenous Q12H  . [START ON 08/20/2016] warfarin  5 mg Oral Once per day on Sun Thu  . warfarin  7.5 mg Oral Once per day on Mon Tue Wed Fri Sat  . Warfarin - Pharmacist Dosing Inpatient   Oral q1800   Continuous Infusions: . sodium chloride    . azithromycin Stopped (08/19/16  0012)  . cefTRIAXone (ROCEPHIN)  IV Stopped (08/18/16 1831)   PRN Meds: sodium chloride, acetaminophen **OR** acetaminophen, albuterol, bisacodyl, dextromethorphan-guaiFENesin, ondansetron **OR** ondansetron (ZOFRAN) IV, senna-docusate, sodium chloride flush  Allergies:   No Known Allergies  Social History:   Social History   Social History  . Marital status: Married    Spouse name: N/A  . Number of children: N/A  . Years of education: N/A   Occupational History  . retired    Social History Main Topics  . Smoking status: Former Smoker    Packs/day: 1.00      Years: 50.00    Types: Cigarettes    Quit date: 03/20/2001  . Smokeless tobacco: Former Systems developer  . Alcohol use No  . Drug use: No  . Sexual activity: Not on file   Other Topics Concern  . Not on file   Social History Narrative  . No narrative on file    Family History:   The patient's family history includes Diabetes in his brother; Heart attack in his father; Heart disease in his father. There is no history of Cancer, Stroke, or Hypertension.  ROS:  Please see the history of present illness.  ROS  All other ROS reviewed and negative.     Physical Exam/Data:   Vitals:   08/19/16 0900 08/19/16 0904 08/19/16 0911 08/19/16 1120  BP: 119/90   (!) 118/94  Pulse: (!) 47   (!) 116  Resp: (!) 39   (!) 32  Temp:    98.1 F (36.7 C)  TempSrc:    Oral  SpO2: (!) 89% 99% 94% (!) 89%  Weight:      Height:        Intake/Output Summary (Last 24 hours) at 08/19/16 1306 Last data filed at 08/19/16 1100  Gross per 24 hour  Intake              530 ml  Output             5275 ml  Net            -4745 ml   Filed Weights   08/18/16 1827 08/19/16 0356  Weight: 189 lb 4.8 oz (85.9 kg) 182 lb 4.8 oz (82.7 kg)   Body mass index is 23.41 kg/m.  General:  Elderly appearing but in no acute distress HEENT: normal Lymph: no adenopathy Neck: no JVD Endocrine:  No thryomegaly Vascular: No carotid bruits; FA pulses 2+ bilaterally without bruits  Cardiac:  normal S1, S2; IRIR; no murmur  Lungs:  Reduced breath sounds and scattered expiratory rales. No wheezing or rhonchi. Large well healed scar on the right back below the scapula. Abd: soft, nontender, no hepatomegaly  Ext: no edema Musculoskeletal:  No deformities, BUE and BLE strength sub normal and equal Skin: warm and dry  Neuro:  CNs 2-12 intact, no focal abnormalities noted Psych:  blunted affect    EKG:  The EKG was personally reviewed and demonstrates atrial fib with a RVR/CVR 99/min  Relevant CV Studies: Prior echo with  normal LV function  Laboratory Data:  Chemistry Recent Labs Lab 08/18/16 1247 08/19/16 0507  NA 137 138  K 3.3* 3.4*  CL 101 101  CO2 27 25  GLUCOSE 202* 230*  BUN 17 17  CREATININE 1.10 1.03  CALCIUM 8.5* 8.3*  GFRNONAA >60 >60  GFRAA >60 >60  ANIONGAP 9 12     Recent Labs Lab 08/19/16 0507  PROT 5.8*  ALBUMIN 2.5*  AST 27  ALT 27  ALKPHOS 212*  BILITOT 1.7*   Hematology Recent Labs Lab 08/18/16 1247 08/19/16 0507  WBC 12.9* 13.0*  RBC 4.96 4.97  HGB 14.9 15.0  HCT 44.5 45.1  MCV 89.7 90.7  MCH 30.0 30.2  MCHC 33.5 33.3  RDW 16.1* 16.4*  PLT 405* 418*   Cardiac Enzymes Recent Labs Lab 08/18/16 1740 08/18/16 2358 08/19/16 0507  TROPONINI 0.03* <0.03 <0.03    Recent Labs Lab 08/18/16 1255  TROPIPOC 0.02    BNP Recent Labs Lab 08/18/16 1428  BNP 356.4*    DDimer No results for input(s): DDIMER in the last 168 hours.  Radiology/Studies:  Dg Chest 2 View  Result Date: 08/18/2016 CLINICAL DATA:  Short of breath and cough EXAM: CHEST  2 VIEW COMPARISON:  06/09/2016.  09/14/2015. FINDINGS: The heart remains markedly enlarged. Course chronic parenchymal changes throughout both lungs is stable. Pleural thickening at the right base is stable. Airspace disease seen on the recent chest x-ray a in the medial right middle and lower lobes has resolved appear no pneumothorax. Bullous changes at the left apex. Osteopenia. IMPRESSION: Resolved right medial lower airspace disease. Bilateral chronic fibrotic changes persist. Electronically Signed   By: Marybelle Killings M.D.   On: 08/18/2016 13:15    Assessment and Plan:   1. Atrial fib with a CVR/RVR - his rate is not optimal. He is on a significant amount of both beta blocker and calcium channel blocker. I will ask him to increase the cardizem. 2. CAD - he denies anginal symptoms. Will follow. 3. Acute on chronic diastolic heart failure - his symptoms are improved. He will continue IV diuresis. Not quite  euvolemic.  4. Lung CA/COPD - he will be dyspneic at baseline with his underlying lung condition.    Signed, Cristopher Peru, MD  08/19/2016 1:06 PM

## 2016-08-20 ENCOUNTER — Inpatient Hospital Stay (HOSPITAL_COMMUNITY): Payer: Medicare Other

## 2016-08-20 DIAGNOSIS — J440 Chronic obstructive pulmonary disease with acute lower respiratory infection: Secondary | ICD-10-CM

## 2016-08-20 DIAGNOSIS — J209 Acute bronchitis, unspecified: Secondary | ICD-10-CM

## 2016-08-20 DIAGNOSIS — I509 Heart failure, unspecified: Secondary | ICD-10-CM

## 2016-08-20 LAB — ECHOCARDIOGRAM COMPLETE
CHL CUP DOP CALC LVOT VTI: 9.88 cm
CHL CUP RV SYS PRESS: 27 mmHg
FS: 20 % — AB (ref 28–44)
HEIGHTINCHES: 74 in
IV/PV OW: 1
LA vol A4C: 87.1 ml
LA vol index: 42.9 mL/m2
LA vol: 91.3 mL
LADIAMINDEX: 2.07 cm/m2
LASIZE: 44 mm
LEFT ATRIUM END SYS DIAM: 44 mm
LV PW d: 12 mm — AB (ref 0.6–1.1)
LVOT area: 3.14 cm2
LVOT diameter: 20 mm
LVOT peak grad rest: 2 mmHg
LVOT peak vel: 73.9 cm/s
LVOTSV: 31 mL
RV LATERAL S' VELOCITY: 6.74 cm/s
Reg peak vel: 219 cm/s
TAPSE: 11.2 mm
TRMAXVEL: 219 cm/s
WEIGHTICAEL: 2867.2 [oz_av]

## 2016-08-20 LAB — BASIC METABOLIC PANEL
ANION GAP: 12 (ref 5–15)
BUN: 18 mg/dL (ref 6–20)
CO2: 29 mmol/L (ref 22–32)
Calcium: 8.3 mg/dL — ABNORMAL LOW (ref 8.9–10.3)
Chloride: 97 mmol/L — ABNORMAL LOW (ref 101–111)
Creatinine, Ser: 1.18 mg/dL (ref 0.61–1.24)
GFR calc Af Amer: >60 mL/min (ref >60)
GFR, EST NON AFRICAN AMERICAN: 56 mL/min — AB (ref >60)
GLUCOSE: 209 mg/dL — AB (ref 65–99)
POTASSIUM: 3 mmol/L — AB (ref 3.5–5.1)
Sodium: 138 mmol/L (ref 135–145)

## 2016-08-20 LAB — GLUCOSE, CAPILLARY
GLUCOSE-CAPILLARY: 219 mg/dL — AB (ref 65–99)
GLUCOSE-CAPILLARY: 215 mg/dL — AB (ref 65–99)
GLUCOSE-CAPILLARY: 278 mg/dL — AB (ref 65–99)
Glucose-Capillary: 416 mg/dL — ABNORMAL HIGH (ref 65–99)

## 2016-08-20 LAB — PROTIME-INR
INR: 3.26
Prothrombin Time: 34 seconds — ABNORMAL HIGH (ref 11.4–15.2)

## 2016-08-20 LAB — CBC
HEMATOCRIT: 45.1 % (ref 39.0–52.0)
HEMOGLOBIN: 14.9 g/dL (ref 13.0–17.0)
MCH: 29.7 pg (ref 26.0–34.0)
MCHC: 33 g/dL (ref 30.0–36.0)
MCV: 89.8 fL (ref 78.0–100.0)
Platelets: 414 10*3/uL — ABNORMAL HIGH (ref 150–400)
RBC: 5.02 MIL/uL (ref 4.22–5.81)
RDW: 16 % — ABNORMAL HIGH (ref 11.5–15.5)
WBC: 12.9 10*3/uL — AB (ref 4.0–10.5)

## 2016-08-20 LAB — PROCALCITONIN: Procalcitonin: <0.1 ng/mL

## 2016-08-20 LAB — MAGNESIUM: Magnesium: 1.6 mg/dL — ABNORMAL LOW (ref 1.7–2.4)

## 2016-08-20 LAB — HEMOGLOBIN A1C
Hgb A1c MFr Bld: 8.9 % — ABNORMAL HIGH (ref 4.8–5.6)
Mean Plasma Glucose: 209 mg/dL

## 2016-08-20 MED ORDER — WARFARIN SODIUM 5 MG PO TABS
5.0000 mg | ORAL_TABLET | ORAL | Status: DC
  Filled 2016-08-20: qty 1

## 2016-08-20 MED ORDER — ALBUTEROL SULFATE (2.5 MG/3ML) 0.083% IN NEBU: 2.5000 mg | INHALATION_SOLUTION | RESPIRATORY_TRACT | Status: DC | PRN

## 2016-08-20 MED ORDER — IPRATROPIUM-ALBUTEROL 0.5-2.5 (3) MG/3ML IN SOLN
3.0000 mL | Freq: Four times a day (QID) | RESPIRATORY_TRACT | Status: DC
  Administered 2016-08-20: 3 mL via RESPIRATORY_TRACT
  Filled 2016-08-20: qty 3

## 2016-08-20 MED ORDER — AZITHROMYCIN 250 MG PO TABS
500.0000 mg | ORAL_TABLET | Freq: Every day | ORAL | Status: DC
  Administered 2016-08-20 – 2016-08-21 (×2): 500 mg via ORAL
  Filled 2016-08-20 (×2): qty 2

## 2016-08-20 MED ORDER — DILTIAZEM HCL ER COATED BEADS 180 MG PO CP24
360.0000 mg | ORAL_CAPSULE | Freq: Every day | ORAL | Status: DC
  Administered 2016-08-21: 360 mg via ORAL
  Filled 2016-08-20: qty 2

## 2016-08-20 MED ORDER — IPRATROPIUM-ALBUTEROL 0.5-2.5 (3) MG/3ML IN SOLN
3.0000 mL | Freq: Two times a day (BID) | RESPIRATORY_TRACT | Status: DC
  Administered 2016-08-21: 3 mL via RESPIRATORY_TRACT
  Filled 2016-08-20: qty 3

## 2016-08-20 MED ORDER — PREDNISONE 20 MG PO TABS
40.0000 mg | ORAL_TABLET | Freq: Every day | ORAL | Status: DC
  Administered 2016-08-20 – 2016-08-21 (×2): 40 mg via ORAL
  Filled 2016-08-20 (×2): qty 2

## 2016-08-20 MED ORDER — MAGNESIUM SULFATE 2 GM/50ML IV SOLN
2.0000 g | Freq: Once | INTRAVENOUS | Status: AC
  Administered 2016-08-20: 2 g via INTRAVENOUS
  Filled 2016-08-20: qty 50

## 2016-08-20 MED ORDER — POTASSIUM CHLORIDE CRYS ER 20 MEQ PO TBCR
40.0000 meq | EXTENDED_RELEASE_TABLET | ORAL | Status: AC
  Administered 2016-08-20 (×2): 40 meq via ORAL
  Filled 2016-08-20 (×2): qty 4

## 2016-08-20 NOTE — Progress Notes (Signed)
  Echocardiogram 2D Echocardiogram has been performed.  Justin Garner 08/20/2016, 11:07 AM

## 2016-08-20 NOTE — Progress Notes (Signed)
PHARMACIST - PHYSICIAN COMMUNICATION DR:   Flonnie Overman Dhungel CONCERNING: Antibiotic IV to Oral Route Change Policy  RECOMMENDATION: This patient is receiving azithromycin by the intravenous route.  Based on criteria approved by the Pharmacy and Therapeutics Committee, the antibiotic(s) is/are being converted to the equivalent oral dose form(s).   DESCRIPTION: These criteria include:  Patient being treated for a respiratory tract infection, urinary tract infection, cellulitis or clostridium difficile associated diarrhea if on metronidazole  The patient is not neutropenic and does not exhibit a GI malabsorption state  The patient is eating (either orally or via tube) and/or has been taking other orally administered medications for a least 24 hours  The patient is improving clinically and has a Tmax < 100.5  If you have questions about this conversion, please contact the Pharmacy Department  [x]   [phone] )  Justin Garner   Stark Klein, PharmD Clinical Pharmacy Resident [phone] (Pager) 08/20/2016 8:58 AM

## 2016-08-20 NOTE — Evaluation (Signed)
Occupational Therapy Evaluation Patient Details Name: Justin Garner MRN: [id number] DOB: 21-Sep-1932 Today's Date: 08/20/2016    History of Present Illness Pt is an 83 y.o. male with a history of hypertension, hyperlipidemia, diabetes mellitus, COPD, GERD, hypothyroidism, PAF and PE on Coumadin, dCHF, CKD-II, and lung cancer (s/p lobectomy, radiation and chemotherapy). He was admitted on 08/18/16 for COPD/CHF exacerbation, LE edema.   Clinical Impression   Pt admitted with above. Pt independent with ADLs, PTA. Feel pt will benefit from acute OT to increase independence prior to d/c. Recommending HHOT upon d/c.     Follow Up Recommendations  Home health OT;Supervision/Assistance - 24 hour    Equipment Recommendations  3 in 1 bedside commode    Recommendations for Other Services       Precautions / Restrictions Precautions Precautions: Fall Precaution Comments: Patient on O2 at 7-8 L Restrictions Weight Bearing Restrictions: No      Mobility Bed Mobility Overal bed mobility: Needs Assistance Bed Mobility: Supine to Sit;Sit to Supine     Supine to sit: Supervision Sit to supine: Supervision      Transfers                 General transfer comment: not assessed-HR up to at least 130s in session.    Balance      No assist given for sitting balance EOB.                                     ADL either performed or assessed with clinical judgement   ADL Overall ADL's : Needs assistance/impaired     Grooming: Wash/dry face;Oral care;Set up;Supervision/safety;Sitting               Lower Body Dressing: Sitting/lateral leans;Set up;Supervision/safety                 General ADL Comments: Educated on energy conservation.      Vision         Perception     Praxis      Pertinent Vitals/Pain Pain Assessment: No/denies pain     Hand Dominance Right   Extremity/Trunk Assessment Upper Extremity Assessment Upper Extremity  Assessment: LUE deficits/detail;RUE deficits/detail RUE Deficits / Details: unable to achieve full AROM shoulder flexion LUE Deficits / Details: unable to achieve full AROM shoulder flexion; Left shoulder flexor strength weaker than right   Lower Extremity Assessment Lower Extremity Assessment: Defer to PT evaluation   Cervical / Trunk Assessment Cervical / Trunk Assessment: Kyphotic   Communication Communication Communication: HOH   Cognition Arousal/Alertness: Awake/alert Behavior During Therapy: Flat affect Overall Cognitive Status: No family/caregiver present to determine baseline cognitive functioning                                 General Comments: not oriented to time   General Comments       Exercises     Shoulder Instructions      Home Living Family/patient expects to be discharged to:: Private residence Living Arrangements: Spouse/significant other Available Help at Discharge: Family;Available 24 hours/day Type of Home: Mobile home Home Access: Stairs to enter Entrance Stairs-Number of Steps: 3 Entrance Stairs-Rails: Right;Left;Can reach both Home Layout: One level     Bathroom Shower/Tub: Occupational psychologist: Standard     Home Equipment: Environmental consultant - 2 wheels;Cane -  single point          Prior Functioning/Environment Level of Independence: Independent with assistive device(s)        Comments: Patient uses cane for ambulation.  Independent in ADL's        OT Problem List: Decreased strength;Decreased range of motion;Decreased activity tolerance;Decreased cognition;Decreased knowledge of use of DME or AE      OT Treatment/Interventions: Self-care/ADL training;Energy conservation;Therapeutic exercise;DME and/or AE instruction;Therapeutic activities;Cognitive remediation/compensation;Balance training;Patient/family education    OT Goals(Current goals can be found in the care plan section) Acute Rehab OT Goals Patient  Stated Goal: go home OT Goal Formulation: With patient Time For Goal Achievement: 08/27/16 Potential to Achieve Goals: Good ADL Goals Pt Will Perform Lower Body Bathing: with set-up;with supervision;sit to/from stand Pt Will Perform Lower Body Dressing: with set-up;with supervision;sit to/from stand Pt Will Transfer to Toilet: with min guard assist;ambulating;bedside commode Pt Will Perform Toileting - Clothing Manipulation and hygiene: with set-up;with supervision;sit to/from stand Additional ADL Goal #1: Pt will independently verbalize 3 energy conservation techniques.   OT Frequency: Min 2X/week   Barriers to D/C:            Co-evaluation              AM-PAC PT "6 Clicks" Daily Activity     Outcome Measure Help from another person eating meals?: None Help from another person taking care of personal grooming?: A Little Help from another person toileting, which includes using toliet, bedpan, or urinal?: A Little Help from another person bathing (including washing, rinsing, drying)?: A Little Help from another person to put on and taking off regular upper body clothing?: A Little Help from another person to put on and taking off regular lower body clothing?: A Little 6 Click Score: 19   End of Session Equipment Utilized During Treatment: Oxygen Nurse Communication: Other (comment);Mobility status (pt wanting to get in touch with wife; HR)  Activity Tolerance: Other (comment) (HR up to at least 130s in session on EOB) Patient left: in bed;with call bell/phone within reach  OT Visit Diagnosis: Muscle weakness (generalized) (M62.81)                Time: 7017-7939 OT Time Calculation (min): 17 min Charges:  OT General Charges $OT Visit: 1 Procedure OT Evaluation $OT Eval Moderate Complexity: 1 Procedure G-Codes:      Benito Mccreedy OTR/L 08/20/2016, 9:18 AM

## 2016-08-20 NOTE — Progress Notes (Signed)
ANTICOAGULATION CONSULT NOTE - Follow Up Consult  Pharmacy Consult for warfarin Indication: atrial fibrillation  No Known Allergies  Patient Measurements: Height: 6\' 2"  (188 cm) Weight: 179 lb 3.2 oz (81.3 kg) IBW/kg (Calculated) : 82.2  Vital Signs: Temp: 97.4 F (36.3 C) (06/03 0734) Temp Source: Oral (06/03 0734) BP: 123/92 (06/03 0734) Pulse Rate: 102 (06/03 0734)  Labs:  Recent Labs  08/18/16 1247 08/18/16 1740 08/18/16 2358 08/19/16 0507 08/20/16 0550  HGB 14.9  --   --  15.0 14.9  HCT 44.5  --   --  45.1 45.1  PLT 405*  --   --  418* 414*  LABPROT 28.2*  --   --  28.8* 34.0*  INR 2.58  --   --  2.66 3.26  CREATININE 1.10  --   --  1.03 1.18  TROPONINI  --  0.03* <0.03 <0.03  --     Estimated Creatinine Clearance: 55.5 mL/min (by C-G formula based on SCr of 1.18 mg/dL).   Medical History: Past Medical History:  Diagnosis Date  . ASCVD (arteriosclerotic cardiovascular disease)   . Cancer [facility]) 2009   Upper lobectomy;radiation; chemotherapy  . Chronic systolic CHF (congestive heart failure) ([place])   . CKD (chronic kidney disease), stage II   . COPD (chronic obstructive pulmonary disease) ([place])   . Coronary artery disease   . Diabetes mellitus   . GERD (gastroesophageal reflux disease)   . Hyperlipidemia   . Hypertension   . Leukocytosis 07/12/2015  . PAF (paroxysmal atrial fibrillation) (HCC)    Chronic warfarin therapy  . Primary cancer of right upper lobe of lung ([place]) 06/30/2013   RUL lobectomy 2009 with chest wall resection for superior sulcus tumor Postoperative radiation and chemotherapy; Dr Earlie Server Oncology monitor [phone]  . Pulmonary embolism (HCC)    PMH of  . Thyroid disease      Assessment: 30 YOM here with fatigue and peripheral edema for a few days. History of AFib and PE on warfarin- home dose 7.5mg  daily except 5mg  on Sundays and Thursdays.  INR rose precipitously from yesterday (2.66>3.26), CBC stable with no bleeding noted. Of  note, patient is currently receiving azithromycin which could potentiate the antithrombotic effects of INR. Will hold warfarin tonight and assess INR tomorrow. If still elevated, then can reduce daily doses according to INR.  Goal of Therapy:  INR 2-3 Monitor platelets by anticoagulation protocol: Yes   Plan:  Hold warfarin tonigh Continue home dose of warfarin- 7.5mg  daily except 5mg  on Sundays and Thursdays Daily INR/CBC Follow s/s bleeding  Dierdre Harness, Cain Sieve, PharmD Clinical Pharmacy Resident [phone] (Pager) 08/20/2016 9:00 AM

## 2016-08-20 NOTE — Progress Notes (Signed)
Progress Note  Patient Name: Justin Garner Date of Encounter: 08/20/2016  Primary Cardiologist: Irish Lack  Subjective   No complaints this morning. Denies chest pain or sob.   Inpatient Medications    Scheduled Meds: . atorvastatin  20 mg Oral Daily  . azithromycin  500 mg Oral Daily  . diltiazem  300 mg Oral Daily  . furosemide  60 mg Intravenous BID  . insulin aspart  0-9 Units Subcutaneous TID WC  . ipratropium-albuterol  3 mL Nebulization Q6H  . levothyroxine  112 mcg Oral QAC breakfast  . metoprolol tartrate  50 mg Oral BID  . mometasone-formoterol  2 puff Inhalation BID  . pantoprazole  40 mg Oral Daily  . potassium chloride  40 mEq Oral Once  . potassium chloride  40 mEq Oral Q4H  . predniSONE  40 mg Oral Q breakfast  . sodium chloride flush  3 mL Intravenous Q12H  . [START ON 08/21/2016] warfarin  5 mg Oral Once per day on Sun Thu  . warfarin  7.5 mg Oral Once per day on Mon Tue Wed Fri Sat  . Warfarin - Pharmacist Dosing Inpatient   Oral q1800   Continuous Infusions: . sodium chloride    . cefTRIAXone (ROCEPHIN)  IV Stopped (08/19/16 1834)   PRN Meds: sodium chloride, acetaminophen **OR** acetaminophen, albuterol, bisacodyl, dextromethorphan-guaiFENesin, ondansetron **OR** ondansetron (ZOFRAN) IV, senna-docusate, sodium chloride flush   Vital Signs    Vitals:   08/20/16 0734 08/20/16 0914 08/20/16 0915 08/20/16 0940  BP: (!) 123/92 (!) 123/92 (!) 123/92   Pulse: (!) 102  (!) 105   Resp: 19     Temp: 97.4 F (36.3 C)     TempSrc: Oral     SpO2: 94%   95%  Weight:      Height:        Intake/Output Summary (Last 24 hours) at 08/20/16 1132 Last data filed at 08/20/16 0916  Gross per 24 hour  Intake              883 ml  Output             1625 ml  Net             -742 ml   Filed Weights   08/18/16 1827 08/19/16 0356 08/20/16 0356  Weight: 189 lb 4.8 oz (85.9 kg) 182 lb 4.8 oz (82.7 kg) 179 lb 3.2 oz (81.3 kg)    Telemetry    Atrial fib with a  CVR/RVR - Personally Reviewed  ECG    none - Personally Reviewed  Physical Exam   GEN: elderly appearing man, No acute distress.   Neck: 6 cm JVD Cardiac: IRIRI, no murmurs, rubs, or gallops.  Respiratory: scattered rales and reduced breath sounds, right greater than left. GI: Soft, nontender, non-distended  MS: No edema; No deformity. Neuro:  Nonfocal  Psych: Normal affect  Ext. - 1+ peripheral edema.  Labs    Chemistry Recent Labs Lab 08/18/16 1247 08/19/16 0507 08/20/16 0550  NA 137 138 138  K 3.3* 3.4* 3.0*  CL 101 101 97*  CO2 27 25 29   GLUCOSE 202* 230* 209*  BUN 17 17 18   CREATININE 1.10 1.03 1.18  CALCIUM 8.5* 8.3* 8.3*  PROT  --  5.8*  --   ALBUMIN  --  2.5*  --   AST  --  27  --   ALT  --  27  --   ALKPHOS  --  212*  --  BILITOT  --  1.7*  --   GFRNONAA >60 >60 56*  GFRAA >60 >60 >60  ANIONGAP 9 12 12      Hematology Recent Labs Lab 08/18/16 1247 08/19/16 0507 08/20/16 0550  WBC 12.9* 13.0* 12.9*  RBC 4.96 4.97 5.02  HGB 14.9 15.0 14.9  HCT 44.5 45.1 45.1  MCV 89.7 90.7 89.8  MCH 30.0 30.2 29.7  MCHC 33.5 33.3 33.0  RDW 16.1* 16.4* 16.0*  PLT 405* 418* 414*    Cardiac Enzymes Recent Labs Lab 08/18/16 1740 08/18/16 2358 08/19/16 0507  TROPONINI 0.03* <0.03 <0.03    Recent Labs Lab 08/18/16 1255  TROPIPOC 0.02     BNP Recent Labs Lab 08/18/16 1428  BNP 356.4*     DDimer No results for input(s): DDIMER in the last 168 hours.   Radiology    Dg Chest 2 View  Result Date: 08/19/2016 CLINICAL DATA:  Shortness of breath.  History of COPD. EXAM: CHEST  2 VIEW COMPARISON:  08/18/2016 and prior radiographs FINDINGS: Cardiomegaly noted. COPD/emphysema changes again noted with right basilar scarring. There is no evidence of focal airspace disease, pulmonary edema, suspicious pulmonary nodule/mass, pleural effusion, or pneumothorax. No acute bony abnormalities are identified. IMPRESSION: No evidence of acute cardiopulmonary disease.  Cardiomegaly and COPD/emphysema. Electronically Signed   By: Margarette Canada M.D.   On: 08/19/2016 15:14   Dg Chest 2 View  Result Date: 08/18/2016 CLINICAL DATA:  Short of breath and cough EXAM: CHEST  2 VIEW COMPARISON:  06/09/2016.  09/14/2015. FINDINGS: The heart remains markedly enlarged. Course chronic parenchymal changes throughout both lungs is stable. Pleural thickening at the right base is stable. Airspace disease seen on the recent chest x-ray a in the medial right middle and lower lobes has resolved appear no pneumothorax. Bullous changes at the left apex. Osteopenia. IMPRESSION: Resolved right medial lower airspace disease. Bilateral chronic fibrotic changes persist. Electronically Signed   By: Marybelle Killings M.D.   On: 08/18/2016 13:15    Cardiac Studies   2D echo - pending  Patient Profile     83 y.o. male admitted with volume overload and atrial fib with a RVR  Assessment & Plan    1. Atrial fib - his rates remain controlled or a little high. We have increased his dose of cardizem. Will follow. 2. Acute on chronic diastolic heart failure - He has diuresed 10 lbs since admit. Would continue IV lasix today although I suspect he is nearly euvolemic.  3. COPD - Continue current meds.  4. CAD - he denies anginal symptoms.  5. Disp. - I suspect he will be ready for DC home tomorrow if no new problems develop.  Signed, Cristopher Peru, MD  08/20/2016, 11:32 AM  Patient ID: Justin Garner, male   DOB: March 01, 1933, 83 y.o.   MRN: [id number]

## 2016-08-20 NOTE — Progress Notes (Signed)
Physical Therapy Treatment Patient Details Name: Justin Garner MRN: [id number] DOB: [birth year] Today's Date: 08/20/2016    History of Present Illness Pt is an 83 y.o. male with a history of hypertension, hyperlipidemia, diabetes mellitus, COPD, GERD, hypothyroidism, PAF and PE on Coumadin, dCHF, CKD-II, and lung cancer (s/p lobectomy, radiation and chemotherapy). He was admitted on 08/18/16 for COPD/CHF exacerbation, LE edema.    PT Comments    Patient making progress with mobility and gait.  Able to ambulate 50' with RW on O2 at 3.5L.  Continue to recommend HHPT at d/c.   Follow Up Recommendations  Home health PT;Supervision for mobility/OOB     Equipment Recommendations  3in1 (PT)    Recommendations for Other Services       Precautions / Restrictions Precautions Precautions: Fall Precaution Comments: HFNC at 3.5 L Restrictions Weight Bearing Restrictions: No    Mobility  Bed Mobility Overal bed mobility: Needs Assistance Bed Mobility: Supine to Sit     Supine to sit: Supervision     General bed mobility comments: Patient required no physical assist to move to sitting.  Good balance once sitting.  Posture remains flexed.  Transfers Overall transfer level: Needs assistance Equipment used: Rolling walker (2 wheeled) Transfers: Sit to/from Stand Sit to Stand: Min assist;+2 safety/equipment         General transfer comment: Assist to steady during transfers.  Ambulation/Gait Ambulation/Gait assistance: Min assist;+2 safety/equipment Ambulation Distance (Feet): 52 Feet Assistive device: Rolling walker (2 wheeled) Gait Pattern/deviations: Step-through pattern;Decreased step length - right;Decreased step length - left;Decreased stride length;Shuffle;Trunk flexed Gait velocity: decreased Gait velocity interpretation: Below normal speed for age/gender General Gait Details: Verbal cues for use of RW.  Patient ambulating on O2 at 4 L.  O2 sats dropped to 88% x1 during  gait.  Patient required several standing rest breaks.   Stairs            Wheelchair Mobility    Modified Rankin (Stroke Patients Only)       Balance Overall balance assessment: Needs assistance Sitting-balance support: No upper extremity supported;Feet supported Sitting balance-Leahy Scale: Fair     Standing balance support: Bilateral upper extremity supported Standing balance-Leahy Scale: Poor                              Cognition Arousal/Alertness: Awake/alert Behavior During Therapy: Flat affect Overall Cognitive Status: Within Functional Limits for tasks assessed                                 General Comments: not oriented to time      Exercises      General Comments        Pertinent Vitals/Pain Pain Assessment: No/denies pain    Home Living                      Prior Function            PT Goals (current goals can now be found in the care plan section) Acute Rehab PT Goals Patient Stated Goal: go home Progress towards PT goals: Progressing toward goals    Frequency    Min 3X/week      PT Plan Current plan remains appropriate    Co-evaluation              AM-PAC PT "6 Clicks" Daily Activity  Outcome Measure  Difficulty turning over in bed (including adjusting bedclothes, sheets and blankets)?: None Difficulty moving from lying on back to sitting on the side of the bed? : A Little Difficulty sitting down on and standing up from a chair with arms (e.g., wheelchair, bedside commode, etc,.)?: A Little Help needed moving to and from a bed to chair (including a wheelchair)?: A Little Help needed walking in hospital room?: A Little Help needed climbing 3-5 steps with a railing? : A Lot 6 Click Score: 18    End of Session Equipment Utilized During Treatment: Gait belt;Oxygen Activity Tolerance: Patient limited by fatigue Patient left: in chair;with call bell/phone within reach;with  nursing/sitter in room;with family/visitor present Nurse Communication: Mobility status PT Visit Diagnosis: Other abnormalities of gait and mobility (R26.89);Muscle weakness (generalized) (M62.81)     Time: 3568-6168 PT Time Calculation (min) (ACUTE ONLY): 23 min  Charges:  $Gait Training: 23-37 mins                    G Codes:       Carita Pian. Sanjuana Kava, [facility] Acute Rehab Services Pager [place] 08/20/2016, 9:28 PM

## 2016-08-20 NOTE — Progress Notes (Signed)
PROGRESS NOTE                                                                                                                                                                                                             Patient Demographics:    Justin Garner, is a 83 y.o. male, DOB - 12/03/1932, WNU:[id number]  Admit date - 08/18/2016   Admitting Physician Reyne Dumas, MD  Outpatient Primary MD for the patient is [name], MD  LOS - 1  Outpatient Specialists:  Dr Irish Lack  Chief Complaint  Patient presents with  . Fatigue       Brief Narrative   83 year old male with history of chronic systolic CHF, chronic kidney disease stage II, COPD not on home O2, diabetes mellitus, paroxysmal A. fib on Coumadin, primary right upper lobe lung cancer status post resection followed by postoperative radiation and chemotherapy, history of PE, hypothyroidism, hypertension who was brought to the ED by his wife for increased fatigue, lethargic and somnolence. Patient also had poor appetite and was minimally active. He was also found to be increasingly short of breath without any complaints of chest pain, fevers, chills, recent illness or travel. Patient reported being compliant with his medications. In the ED he had stable vitals, labs showed a potassium of 3.3, BNP of 346 glucose of 202. Chest x-ray showed bilateral chronic fibrotic changes. His O2 sat dropped to 88% on room air.  ABG showed pH of 7.5, PCO2 of 31 and PO2 of 71.  Patient admitted to telemetry for acute on chronic diastolic CHF with hypoxic respiratory failure. Overnight patient went into rapid A. fib and increased oxygen requirement.   Subjective:   Patient was on high flow nasal cannula almost at 10 L. Oxygen dropped down to 3 L and has been maintaining O2 sat. Per wife he remained drowsy yesterday evening. This morning he is alert and communicating.  Assessment  &  Plan :    Principal problem Acute respiratory failure with hypoxia ( HCC) Possibly due to acute on chronic diastolic CHF and Mild COPD exacerbation. Maintaining sats on 3 L nasal cannula this morning. ABG showing hypoxemia. Continue IV Lasix at current dose. Follow 2-D echo. -Strict I/O and daily weight. Replenish low potassium and magnesium. Cardiology consult appreciated.   Active Problems: Atrial fibrillation with RVR (HCC) -Heart rate slightly  improved (still ranges from 90-110). Have room for further going up on Cardizem dose (was increased to 300 mg daily yesterday). Continue metoprolol at increased dose. Continue Coumadin.     COPD with acute bronchitis (HCC) Continue nebs. Empiric Rocephin and azithromycin. Antitussives when necessary. We'll add oral prednisone.     Coronary atherosclerosis of native coronary artery Continue beta blocker and statin.    Essential hypertension, benign Stable. Home medications adjusted.    Primary cancer of right upper lobe of lung ([place]) Status post lobectomy and postsurgical radiation and chemotherapy in 2009. Currently being monitored by Dr. Julien Nordmann.    Hypokalemia/hypomagnesemia Replenished    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 (HCC) Monitor on sliding scale coverage.     Hypothyroidism Continue Synthroid. TSH normal.  Lethargy Suspected hypercapnic respiratory failure but his PCO2 is not elevated. No clinical signs of infection. He is on empiric Rocephin and azithromycin for COPD exacerbation. TSH normal. Check B12.      Code Status : Full code  Family Communication  : Wife at bedside  Disposition Plan  : Continue step down monitoring today. Possible home in the next 48 hours  Barriers For Discharge : Active symptoms  Consults  :   Cardiology  Procedures  :  2-D echo  DVT Prophylaxis  :  Coumadin  Lab Results  Component Value Date   PLT 414 (H) 08/20/2016    Antibiotics  :    Anti-infectives    Start      Dose/Rate Route Frequency Ordered Stop   08/20/16 1800  azithromycin (ZITHROMAX) tablet 500 mg     500 mg Oral Daily 08/20/16 0900     08/18/16 1730  cefTRIAXone (ROCEPHIN) 1 g in dextrose 5 % 50 mL IVPB     1 g 100 mL/hr over 30 Minutes Intravenous Every 24 hours 08/18/16 1704     08/18/16 1715  azithromycin (ZITHROMAX) 500 mg in dextrose 5 % 250 mL IVPB  Status:  Discontinued     500 mg 250 mL/hr over 60 Minutes Intravenous Every 24 hours 08/18/16 1702 08/20/16 0900        Objective:   Vitals:   08/20/16 0734 08/20/16 0914 08/20/16 0915 08/20/16 0940  BP: (!) 123/92 (!) 123/92 (!) 123/92   Pulse: (!) 102  (!) 105   Resp: 19     Temp: 97.4 F (36.3 C)     TempSrc: Oral     SpO2: 94%   95%  Weight:      Height:        Wt Readings from Last 3 Encounters:  08/20/16 81.3 kg (179 lb 3.2 oz)  08/03/16 86.5 kg (190 lb 12.8 oz)  06/10/16 86.1 kg (189 lb 13.1 oz)     Intake/Output Summary (Last 24 hours) at 08/20/16 1100 Last data filed at 08/20/16 0916  Gross per 24 hour  Intake              883 ml  Output             1625 ml  Net             -742 ml     Physical Exam Gen.: Elderly male not in distress, more alert HEENT: Moist mucosa, JVD +, supple neck Chest: Diminished bibasilar breath sounds, no rhonchi or wheeze CVS: S1 and S2 irregularly irregular, no murmurs or gallop GI: Soft, nondistended, nontender Musculoskeletal: Warm, trace edema CNS: Alert and oriented     Data Review:  CBC  Recent Labs Lab 08/18/16 1247 08/19/16 0507 08/20/16 0550  WBC 12.9* 13.0* 12.9*  HGB 14.9 15.0 14.9  HCT 44.5 45.1 45.1  PLT 405* 418* 414*  MCV 89.7 90.7 89.8  MCH 30.0 30.2 29.7  MCHC 33.5 33.3 33.0  RDW 16.1* 16.4* 16.0*    Chemistries   Recent Labs Lab 08/18/16 1247 08/18/16 2358 08/19/16 0507 08/20/16 0550  NA 137  --  138 138  K 3.3*  --  3.4* 3.0*  CL 101  --  101 97*  CO2 27  --  25 29  GLUCOSE 202*  --  230* 209*  BUN 17  --  17 18    CREATININE 1.10  --  1.03 1.18  CALCIUM 8.5*  --  8.3* 8.3*  MG  --  1.2*  --  1.6*  AST  --   --  27  --   ALT  --   --  27  --   ALKPHOS  --   --  212*  --   BILITOT  --   --  1.7*  --    ------------------------------------------------------------------------------------------------------------------ No results for input(s): CHOL, HDL, LDLCALC, TRIG, CHOLHDL, LDLDIRECT in the last 72 hours.  Lab Results  Component Value Date   HGBA1C 8.9 (H) 08/18/2016   ------------------------------------------------------------------------------------------------------------------  Recent Labs  08/18/16 1637  TSH 2.180   ------------------------------------------------------------------------------------------------------------------ No results for input(s): VITAMINB12, FOLATE, FERRITIN, TIBC, IRON, RETICCTPCT in the last 72 hours.  Coagulation profile  Recent Labs Lab 08/18/16 1247 08/19/16 0507 08/20/16 0550  INR 2.58 2.66 3.26    No results for input(s): DDIMER in the last 72 hours.  Cardiac Enzymes  Recent Labs Lab 08/18/16 1740 08/18/16 2358 08/19/16 0507  TROPONINI 0.03* <0.03 <0.03   ------------------------------------------------------------------------------------------------------------------    Component Value Date/Time   BNP 356.4 (H) 08/18/2016 1428    Inpatient Medications  Scheduled Meds: . atorvastatin  20 mg Oral Daily  . azithromycin  500 mg Oral Daily  . diltiazem  300 mg Oral Daily  . furosemide  60 mg Intravenous BID  . insulin aspart  0-9 Units Subcutaneous TID WC  . ipratropium-albuterol  3 mL Nebulization Q6H  . levothyroxine  112 mcg Oral QAC breakfast  . metoprolol tartrate  50 mg Oral BID  . mometasone-formoterol  2 puff Inhalation BID  . pantoprazole  40 mg Oral Daily  . potassium chloride  40 mEq Oral Once  . potassium chloride  40 mEq Oral Q4H  . sodium chloride flush  3 mL Intravenous Q12H  . [START ON 08/21/2016] warfarin  5 mg  Oral Once per day on Sun Thu  . warfarin  7.5 mg Oral Once per day on Mon Tue Wed Fri Sat  . Warfarin - Pharmacist Dosing Inpatient   Oral q1800   Continuous Infusions: . sodium chloride    . cefTRIAXone (ROCEPHIN)  IV Stopped (08/19/16 1834)   PRN Meds:.sodium chloride, acetaminophen **OR** acetaminophen, albuterol, bisacodyl, dextromethorphan-guaiFENesin, ondansetron **OR** ondansetron (ZOFRAN) IV, senna-docusate, sodium chloride flush  Micro Results Recent Results (from the past 240 hour(s))  Culture, blood (Routine X 2) w Reflex to ID Panel     Status: None (Preliminary result)   Collection Time: 08/18/16  5:32 PM  Result Value Ref Range Status   Specimen Description BLOOD RIGHT WRIST  Final   Special Requests   Final    BOTTLES DRAWN AEROBIC AND ANAEROBIC Blood Culture adequate volume   Culture NO GROWTH < 24 HOURS  Final  Report Status PENDING  Incomplete  Culture, blood (Routine X 2) w Reflex to ID Panel     Status: None (Preliminary result)   Collection Time: 08/18/16  5:40 PM  Result Value Ref Range Status   Specimen Description BLOOD LEFT FOREARM  Final   Special Requests   Final    BOTTLES DRAWN AEROBIC AND ANAEROBIC Blood Culture adequate volume   Culture NO GROWTH < 24 HOURS  Final   Report Status PENDING  Incomplete    Radiology Reports Dg Chest 2 View  Result Date: 08/19/2016 CLINICAL DATA:  Shortness of breath.  History of COPD. EXAM: CHEST  2 VIEW COMPARISON:  08/18/2016 and prior radiographs FINDINGS: Cardiomegaly noted. COPD/emphysema changes again noted with right basilar scarring. There is no evidence of focal airspace disease, pulmonary edema, suspicious pulmonary nodule/mass, pleural effusion, or pneumothorax. No acute bony abnormalities are identified. IMPRESSION: No evidence of acute cardiopulmonary disease. Cardiomegaly and COPD/emphysema. Electronically Signed   By: Margarette Canada M.D.   On: 08/19/2016 15:14   Dg Chest 2 View  Result Date:  08/18/2016 CLINICAL DATA:  Short of breath and cough EXAM: CHEST  2 VIEW COMPARISON:  06/09/2016.  09/14/2015. FINDINGS: The heart remains markedly enlarged. Course chronic parenchymal changes throughout both lungs is stable. Pleural thickening at the right base is stable. Airspace disease seen on the recent chest x-ray a in the medial right middle and lower lobes has resolved appear no pneumothorax. Bullous changes at the left apex. Osteopenia. IMPRESSION: Resolved right medial lower airspace disease. Bilateral chronic fibrotic changes persist. Electronically Signed   By: Marybelle Killings M.D.   On: 08/18/2016 13:15    Time Spent in minutes 35   Louellen Molder M.D on 08/20/2016 at 11:00 AM  Between 7am to 7pm - Pager - [phone]  After 7pm go to [email or web address] - password [facility]  [facility] -  Office  [phone]

## 2016-08-21 DIAGNOSIS — I10 Essential (primary) hypertension: Secondary | ICD-10-CM

## 2016-08-21 DIAGNOSIS — E1165 Type 2 diabetes mellitus with hyperglycemia: Secondary | ICD-10-CM

## 2016-08-21 DIAGNOSIS — J9621 Acute and chronic respiratory failure with hypoxia: Secondary | ICD-10-CM

## 2016-08-21 LAB — BASIC METABOLIC PANEL
Anion gap: 11 (ref 5–15)
BUN: 31 mg/dL — AB (ref 6–20)
CHLORIDE: 96 mmol/L — AB (ref 101–111)
CO2: 27 mmol/L (ref 22–32)
CREATININE: 1.35 mg/dL — AB (ref 0.61–1.24)
Calcium: 8.3 mg/dL — ABNORMAL LOW (ref 8.9–10.3)
GFR calc Af Amer: 55 mL/min — ABNORMAL LOW (ref >60)
GFR calc non Af Amer: 47 mL/min — ABNORMAL LOW (ref >60)
Glucose, Bld: 379 mg/dL — ABNORMAL HIGH (ref 65–99)
Potassium: 3.7 mmol/L (ref 3.5–5.1)
SODIUM: 134 mmol/L — AB (ref 135–145)

## 2016-08-21 LAB — CBC
HCT: 44.4 % (ref 39.0–52.0)
HEMOGLOBIN: 14.7 g/dL (ref 13.0–17.0)
MCH: 29.7 pg (ref 26.0–34.0)
MCHC: 33.1 g/dL (ref 30.0–36.0)
MCV: 89.7 fL (ref 78.0–100.0)
PLATELETS: 455 10*3/uL — AB (ref 150–400)
RBC: 4.95 MIL/uL (ref 4.22–5.81)
RDW: 15.9 % — ABNORMAL HIGH (ref 11.5–15.5)
WBC: 14.7 10*3/uL — ABNORMAL HIGH (ref 4.0–10.5)

## 2016-08-21 LAB — PROTIME-INR
INR: 3.11
Prothrombin Time: 32.8 seconds — ABNORMAL HIGH (ref 11.4–15.2)

## 2016-08-21 LAB — GLUCOSE, CAPILLARY
GLUCOSE-CAPILLARY: 364 mg/dL — AB (ref 65–99)
Glucose-Capillary: 290 mg/dL — ABNORMAL HIGH (ref 65–99)
Glucose-Capillary: 349 mg/dL — ABNORMAL HIGH (ref 65–99)

## 2016-08-21 MED ORDER — GLIPIZIDE 10 MG PO TABS
10.0000 mg | ORAL_TABLET | Freq: Every day | ORAL | Status: DC
  Administered 2016-08-21: 10 mg via ORAL
  Filled 2016-08-21: qty 1

## 2016-08-21 MED ORDER — FUROSEMIDE 20 MG PO TABS: 40.0000 mg | ORAL_TABLET | Freq: Every day | ORAL | 0 refills | Status: DC

## 2016-08-21 MED ORDER — PREDNISONE 20 MG PO TABS: 40.0000 mg | ORAL_TABLET | Freq: Every day | ORAL | 0 refills | Status: AC

## 2016-08-21 MED ORDER — SENNOSIDES-DOCUSATE SODIUM 8.6-50 MG PO TABS
2.0000 | ORAL_TABLET | Freq: Once | ORAL | Status: AC
Start: 2016-08-21 — End: 2016-08-21
  Administered 2016-08-21: 2 via ORAL
  Filled 2016-08-21: qty 2

## 2016-08-21 MED ORDER — AMOXICILLIN-POT CLAVULANATE 875-125 MG PO TABS: 1.0000 | ORAL_TABLET | Freq: Two times a day (BID) | ORAL | 0 refills | Status: AC

## 2016-08-21 MED ORDER — INSULIN ASPART 100 UNIT/ML ~~LOC~~ SOLN: 0.0000 [IU] | Freq: Every day | SUBCUTANEOUS | Status: DC

## 2016-08-21 MED ORDER — LISINOPRIL 5 MG PO TABS
5.0000 mg | ORAL_TABLET | Freq: Every day | ORAL | Status: DC
  Administered 2016-08-21: 5 mg via ORAL
  Filled 2016-08-21: qty 1

## 2016-08-21 MED ORDER — INSULIN ASPART 100 UNIT/ML ~~LOC~~ SOLN
0.0000 [IU] | Freq: Three times a day (TID) | SUBCUTANEOUS | Status: DC
  Administered 2016-08-21: 8 [IU] via SUBCUTANEOUS
  Administered 2016-08-21: 11 [IU] via SUBCUTANEOUS

## 2016-08-21 MED ORDER — SENNOSIDES-DOCUSATE SODIUM 8.6-50 MG PO TABS
1.0000 | ORAL_TABLET | Freq: Every evening | ORAL | 0 refills | Status: DC | PRN
Start: 2016-08-21 — End: 2017-05-08

## 2016-08-21 MED ORDER — LISINOPRIL 5 MG PO TABS: 5.0000 mg | ORAL_TABLET | Freq: Every day | ORAL | 0 refills | Status: DC

## 2016-08-21 MED ORDER — METOPROLOL TARTRATE 25 MG PO TABS
25.0000 mg | ORAL_TABLET | Freq: Two times a day (BID) | ORAL | Status: DC
  Administered 2016-08-21: 25 mg via ORAL
  Filled 2016-08-21: qty 1

## 2016-08-21 MED ORDER — DILTIAZEM HCL ER COATED BEADS 360 MG PO CP24: 360.0000 mg | ORAL_CAPSULE | Freq: Every day | ORAL | 0 refills | Status: DC

## 2016-08-21 NOTE — Progress Notes (Signed)
Physical Therapy Treatment Patient Details Name: Justin Garner MRN: [id number] DOB: March 13, 1933 Today's Date: 08/21/2016    History of Present Illness Pt is an 83 y.o. male with a history of hypertension, hyperlipidemia, diabetes mellitus, COPD, GERD, hypothyroidism, PAF and PE on Coumadin, dCHF, CKD-II, and lung cancer (s/p lobectomy, radiation and chemotherapy). He was admitted on 08/18/16 for COPD/CHF exacerbation, LE edema.    PT Comments    Pt is progressing well with gait and mobility and is maintaining sats on RA at rest at 90-91%, however, he needs up to 3 L O2 [place] to maintain O2 sats >90% during gait (see separate O2 qualification note) and HR ranged from 85 at rest to 120 during gait. PT will continue to follow acutely to help progress mobility to encourage d/c home.  IS given and use reviewed with pt at end of session.   Follow Up Recommendations  Home health PT;Supervision for mobility/OOB     Equipment Recommendations  3in1 (PT)    Recommendations for Other Services    NA    Precautions / Restrictions Precautions Precautions: Fall Precaution Comments: shuffling gait pattern.     Mobility  Bed Mobility Overal bed mobility: Needs Assistance Bed Mobility: Supine to Sit     Supine to sit: Min assist     General bed mobility comments: Min assist to give pt a hand to pull out of hole in his bed (very compliant in the middle.   Transfers Overall transfer level: Needs assistance Equipment used: Rolling walker (2 wheeled) Transfers: Sit to/from Stand Sit to Stand: Min assist         General transfer comment: Min assist to get to standing from low bed and to help stabilize RW once up.   Ambulation/Gait Ambulation/Gait assistance: Min guard Ambulation Distance (Feet): 150 Feet Assistive device: Rolling walker (2 wheeled) Gait Pattern/deviations: Step-through pattern;Shuffle;Trunk flexed;Narrow base of support Gait velocity: decreased Gait velocity  interpretation: Below normal speed for age/gender General Gait Details: Pt with very narrow, shuffling gait.  Per wife he has walked like this for a few months now, but uses a cane only at baseline.  He was unable to consistantly maintain good O2 sats while walking on RA, and needed 3 L O2 [place] to maintain >90%.  HR increased to 120s from 85 at rest.           Balance Overall balance assessment: Needs assistance Sitting-balance support: Feet supported;No upper extremity supported Sitting balance-Leahy Scale: Good     Standing balance support: Bilateral upper extremity supported Standing balance-Leahy Scale: Poor                              Cognition Arousal/Alertness: Awake/alert Behavior During Therapy: WFL for tasks assessed/performed Overall Cognitive Status: Within Functional Limits for tasks assessed                                        Exercises Other Exercises Other Exercises: IS x 5 reps 1750 max inspired volume        Pertinent Vitals/Pain Pain Assessment: No/denies pain           PT Goals (current goals can now be found in the care plan section) Acute Rehab PT Goals Patient Stated Goal: go home Progress towards PT goals: Progressing toward goals    Frequency    Min  3X/week      PT Plan Current plan remains appropriate       AM-PAC PT "6 Clicks" Daily Activity  Outcome Measure  Difficulty turning over in bed (including adjusting bedclothes, sheets and blankets)?: None Difficulty moving from lying on back to sitting on the side of the bed? : Total Difficulty sitting down on and standing up from a chair with arms (e.g., wheelchair, bedside commode, etc,.)?: Total Help needed moving to and from a bed to chair (including a wheelchair)?: A Little Help needed walking in hospital room?: A Little Help needed climbing 3-5 steps with a railing? : A Little 6 Click Score: 15    End of Session Equipment Utilized During  Treatment: Gait belt;Oxygen (3 L O2 [place]) Activity Tolerance: Patient limited by fatigue Patient left: in chair;with call bell/phone within reach;with family/visitor present;with chair alarm set Nurse Communication: Mobility status PT Visit Diagnosis: Other abnormalities of gait and mobility (R26.89);Muscle weakness (generalized) (M62.81)     Time: 9735-3299 PT Time Calculation (min) (ACUTE ONLY): 18 min  Charges:  $Gait Training: 8-22 mins          Ivette Castronova B. [place], [place], DPT [phone]            08/21/2016, 1:45 PM

## 2016-08-21 NOTE — Progress Notes (Signed)
Progress Note  Patient Name: Justin Garner Date of Encounter: 08/21/2016  Primary Cardiologist: Dr. Irish Lack   Subjective   "I feel like I always do"  No chest pain and no awareness of SOB.  Inpatient Medications    Scheduled Meds: . atorvastatin  20 mg Oral Daily  . azithromycin  500 mg Oral Daily  . diltiazem  360 mg Oral Daily  . furosemide  60 mg Intravenous BID  . glipiZIDE  10 mg Oral QAC breakfast  . insulin aspart  0-15 Units Subcutaneous TID WC  . insulin aspart  0-5 Units Subcutaneous QHS  . ipratropium-albuterol  3 mL Nebulization BID  . levothyroxine  112 mcg Oral QAC breakfast  . metoprolol tartrate  50 mg Oral BID  . mometasone-formoterol  2 puff Inhalation BID  . pantoprazole  40 mg Oral Daily  . potassium chloride  40 mEq Oral Once  . predniSONE  40 mg Oral Q breakfast  . sodium chloride flush  3 mL Intravenous Q12H  . warfarin  5 mg Oral Once per day on Sun Thu  . warfarin  7.5 mg Oral Once per day on Mon Tue Wed Fri Sat  . Warfarin - Pharmacist Dosing Inpatient   Oral q1800   Continuous Infusions: . sodium chloride    . cefTRIAXone (ROCEPHIN)  IV 1 g (08/20/16 1718)   PRN Meds: sodium chloride, acetaminophen **OR** acetaminophen, albuterol, bisacodyl, dextromethorphan-guaiFENesin, ondansetron **OR** ondansetron (ZOFRAN) IV, senna-docusate, sodium chloride flush   Vital Signs    Vitals:   08/21/16 0600 08/21/16 0734 08/21/16 0741 08/21/16 0748  BP: 101/65 112/77    Pulse: (!) 40 75    Resp: (!) 23 (!) 26    Temp:  97.5 F (36.4 C)    TempSrc:  Oral    SpO2: 97% 96% 96% 93%  Weight:      Height:        Intake/Output Summary (Last 24 hours) at 08/21/16 0857 Last data filed at 08/21/16 0444  Gross per 24 hour  Intake              603 ml  Output             2350 ml  Net            -1747 ml   Filed Weights   08/19/16 0356 08/20/16 0356 08/21/16 0443  Weight: 182 lb 4.8 oz (82.7 kg) 179 lb 3.2 oz (81.3 kg) 180 lb 9.6 oz (81.9 kg)     Telemetry    A fib rate controlled, at times at night rare pause and HR rarely to 38 or 44.  - Personally Reviewed  ECG    No new - Personally Reviewed  Physical Exam   GEN: No acute distress.   Neck: No JVD Cardiac: irrreg irreg, no murmurs, rubs, or gallops.  Respiratory: bilateral breath sounds to auscultation bilaterally. + rhonchi GI: Soft, nontender, non-distended  MS: No edema; No deformity. Neuro:  Nonfocal  Psych: Normal affect   Labs    Chemistry Recent Labs Lab 08/19/16 0507 08/20/16 0550 08/21/16 0411  NA 138 138 134*  K 3.4* 3.0* 3.7  CL 101 97* 96*  CO2 25 29 27   GLUCOSE 230* 209* 379*  BUN 17 18 31*  CREATININE 1.03 1.18 1.35*  CALCIUM 8.3* 8.3* 8.3*  PROT 5.8*  --   --   ALBUMIN 2.5*  --   --   AST 27  --   --  ALT 27  --   --   ALKPHOS 212*  --   --   BILITOT 1.7*  --   --   GFRNONAA >60 56* 47*  GFRAA >60 >60 55*  ANIONGAP 12 12 11      Hematology Recent Labs Lab 08/19/16 0507 08/20/16 0550 08/21/16 0411  WBC 13.0* 12.9* 14.7*  RBC 4.97 5.02 4.95  HGB 15.0 14.9 14.7  HCT 45.1 45.1 44.4  MCV 90.7 89.8 89.7  MCH 30.2 29.7 29.7  MCHC 33.3 33.0 33.1  RDW 16.4* 16.0* 15.9*  PLT 418* 414* 455*    Cardiac Enzymes Recent Labs Lab 08/18/16 1740 08/18/16 2358 08/19/16 0507  TROPONINI 0.03* <0.03 <0.03    Recent Labs Lab 08/18/16 1255  TROPIPOC 0.02     BNP Recent Labs Lab 08/18/16 1428  BNP 356.4*     DDimer No results for input(s): DDIMER in the last 168 hours.   Radiology    Dg Chest 2 View  Result Date: 08/19/2016 CLINICAL DATA:  Shortness of breath.  History of COPD. EXAM: CHEST  2 VIEW COMPARISON:  08/18/2016 and prior radiographs FINDINGS: Cardiomegaly noted. COPD/emphysema changes again noted with right basilar scarring. There is no evidence of focal airspace disease, pulmonary edema, suspicious pulmonary nodule/mass, pleural effusion, or pneumothorax. No acute bony abnormalities are identified. IMPRESSION:  No evidence of acute cardiopulmonary disease. Cardiomegaly and COPD/emphysema. Electronically Signed   By: Margarette Canada M.D.   On: 08/19/2016 15:14    Cardiac Studies   Echo 08/20/16  Study Conclusions  - Left ventricle: The cavity size was normal. Wall thickness was   increased in a pattern of mild LVH. Systolic function was   moderately reduced. The estimated ejection fraction was in the   range of 35% to 40%. Moderate diffuse hypokinesis with no   identifiable regional variations. - Mitral valve: Calcified annulus. Mildly thickened leaflets .   There was mild to moderate regurgitation directed centrally. - Left atrium: The atrium was mildly to moderately dilated. - Right ventricle: The cavity size was moderately dilated. Systolic   function was moderately reduced. - Right atrium: The atrium was severely dilated. - Tricuspid valve: There was moderate regurgitation. - Pulmonary arteries: Systolic pressure was mildly increased. PA   peak pressure: 34 mm Hg (S).  This is with decrease of EF from 06/16/15  Echo 06/15/16 Study Conclusions  - Left ventricle: EF hard to judge due to rapid afib Systolic   function was normal. The estimated ejection fraction was in the   range of 50% to 55%. - Mitral valve: Calcified annulus. There was mild regurgitation. - Left atrium: The atrium was moderately dilated. - Right ventricle: The cavity size was mildly dilated. - Right atrium: The atrium was moderately dilated. - Atrial septum: No defect or patent foramen ovale was identified  Patient Profile     83 y.o. male admitted with volume overload and atrial fib with a RVR.  Has had atrial fib for over a year (review of all old ECG's) and takes chronic warfarin therapy for thromboembolic prevention  Assessment & Plan    Atrial fib - on dilt now at 360 mg  Up from 240 mg, also lopressor 25 mg BID  --rate controlled down to 38 last pm --NSVT vs. aberrancy on the 3rd  Acute on chronic diastolic  HF and now sytolic HF..  IV lasix  Negative 7,357  Wt down from 189 to 180 lbs.  --Lasix 60 mg IV  BID change to  po today  (at home was on lasix 20 mg daily.)  D/c at 40 mg po daily?    Anticoaglulation - on coumadin  INR 3.11  CHA2DS2VASC 6.  Cardiomyopathy possible due to a fib for a year?    COPD  Treated- not previously on home 02 but may need at discharge.   With acute bronchitis.  On zithromax.   CAD  No chest pain.   DM-2 per IM  Possible discharge today.?    Signed, Cecilie Kicks, NP  08/21/2016, 8:57 AM

## 2016-08-21 NOTE — Discharge Summary (Addendum)
Physician Discharge Summary  ALA CAPRI TLX:[id number] DOB: January 12, 1933 DOA: 08/18/2016  PCP: [name], MD  Admit date: 08/18/2016 Discharge date: 08/21/2016  Admitted From:Home Disposition:  Home  Recommendations for Outpatient Follow-up:  1. Follow up with PCP in 1-2 weeks. Patient will complete 5 day course of antibiotics on 6/6. 2. Please check BMET outpatient follow-up if renal function is stable, please oral metformin or another hypoglycemic agent for better blood glucose control. 3. Follow-up with cardiologist in about 3 weeks.  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   Hypothyroidism   CAD in native artery    Brief narrative/History of present illness 83 year old male with history of chronic systolic CHF, chronic kidney disease stage II, COPD not on home O2, diabetes mellitus, paroxysmal A. fib on Coumadin, primary right upper lobe lung cancer status post resection followed by postoperative radiation and chemotherapy, history of PE, hypothyroidism, hypertension who was brought to the ED by his wife for increased fatigue, lethargic and somnolence. Patient also had poor appetite and was minimally active. He was also found to be increasingly short of breath without any complaints of chest pain, fevers, chills, recent illness or  travel. Patient reported being compliant with his medications. In the ED he had stable vitals, labs showed a potassium of 3.3, BNP of 346 glucose of 202. Chest x-ray showed bilateral chronic fibrotic changes. His O2 sat dropped to 88% on room air.  ABG showed pH of 7.5, PCO2 of 31 and PO2 of 71.  Patient admitted to telemetry for acute on chronic diastolic CHF with hypoxic respiratory failure. Overnight patient went into rapid A. fib and increased oxygen requirement.  Hospital course   Principal problem Acute respiratory failure with hypoxia ( HCC) Secondary to acute systolic CHF and mild COPD exacerbation.Marland Kitchen Required nonrebreather briefly while in the hospital. Placed on IV Lasix 60 mg twice a day with good diuresis.  -Symptoms much improved. 2-D echo showing markedly reduced EF of 35-40% with diffuse hypokinesis. -Cardiology consult appreciated. Continue metoprolol at current dose, added low-dose lisinopril and will discharge him home on increased dose of Lasix (40 mg daily). Continue statin. -Patient also needs oxygen (3L ) with ambulation. Home health thousand and PT arranged. -Follow up with cardiology as outpatient.   Active Problems: Atrial fibrillation with RVR (HCC) -Heart rate improved after increasing Cardizem dose to 360 mg daily. Metoprolol dose was also increased but given his COPD and sinus pause of 2.5 seconds and Robson HR to the 40s overnight with continue him on his home dose of metoprolol. -Continue Coumadin. INR therapeutic.    COPD with acute bronchitis ([place]) Started on oral prednisone. (We'll treat for 5 days only given hyperglycemia). Symptoms improved with nebs and empiric antibiotic. Will discharge him to oral Augmentin to complete 5 day course. Continue home inhalers.     Coronary atherosclerosis of native  coronary artery Continue beta blocker and statin.    Essential hypertension, benign Stable. Home medications adjusted.    Primary cancer  of right upper lobe of lung ([place]) Status post lobectomy and postsurgical radiation and chemotherapy in 2009. Currently being monitored by Dr. Julien Nordmann.    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 Patient on glipizide at home which he resumed. Patient hyperglycemic with CBG in the 300s-400s while on prednisone. Will discharge him on short course prednisone. Discussed options for home insulin but patient and wife prefer to had another oral hypoglycemic. His renal function is mildly worsened this morning with diuresis and have also added low-dose lisinopril. Please check renal function as outpatient and stable, added metformin.     Hypothyroidism Continue Synthroid. TSH normal.  Lethargy Possibly due to acute symptoms. Now resolved.      Code Status : Full code  Family Communication  : Wife at bedside  Disposition Plan  :  home with home health   Consults  :   Cardiology  Procedures  :  2-D echo  Discharge Instructions   Allergies as of 08/21/2016   No Known Allergies     Medication List    STOP taking these medications   feeding supplement (PRO-STAT SUGAR FREE 64) Liqd   levofloxacin 500 MG tablet Commonly known as:  LEVAQUIN     TAKE these medications   albuterol 108 (90 Base) MCG/ACT inhaler Commonly known as:  PROVENTIL HFA;VENTOLIN HFA Inhale 2 puffs into the lungs every 6 (six) hours as needed for wheezing or shortness of breath.   amoxicillin-clavulanate 875-125 MG tablet Commonly known as:  AUGMENTIN Take 1 tablet by mouth 2 (two) times daily.   atorvastatin 20 MG tablet Commonly known as:  LIPITOR Take 1 tablet (20 mg total) by mouth daily.   budesonide-formoterol 80-4.5 MCG/ACT inhaler Commonly known as:  SYMBICORT Inhale 2 puffs into the lungs every morning.   dextromethorphan-guaiFENesin 30-600 MG 12hr tablet Commonly known as:  MUCINEX DM Take 1 tablet by mouth 2  (two) times daily as needed for cough.   diltiazem 360 MG 24 hr capsule Commonly known as:  CARDIZEM CD Take 1 capsule (360 mg total) by mouth daily. What changed:  medication strength  how much to take   fish oil-omega-3 fatty acids 1000 MG capsule Take 1 g by mouth daily.   furosemide 20 MG tablet Commonly known as:  LASIX Take 2 tablets (40 mg total) by mouth daily. What changed:  how much to take   glipiZIDE 10 MG tablet Commonly known as:  GLUCOTROL Take 10 mg by mouth daily.   levothyroxine 112 MCG tablet Commonly known as:  SYNTHROID, LEVOTHROID Take 112 mcg by mouth daily.   lisinopril 5 MG tablet Commonly known as:  PRINIVIL,ZESTRIL Take 1 tablet (5 mg total) by mouth daily. Start taking on:  08/22/2016   metoprolol tartrate 25 MG tablet Commonly known as:  LOPRESSOR TAKE 1/2 TABLET BY MOUTH TWICE A DAY What changed:  See the new instructions.   nitroGLYCERIN 0.4 MG SL tablet Commonly known as:  NITROSTAT Place 1 tablet (0.4 mg total) under the tongue every 5 (five) minutes as needed for chest pain up to 3 doses   omeprazole 20 MG capsule Commonly known as:  PRILOSEC Take 20 mg by mouth daily.   predniSONE 20 MG tablet Commonly known as:  DELTASONE Take 2 tablets (40 mg total) by mouth daily with  breakfast. Start taking on:  08/22/2016   senna-docusate 8.6-50 MG tablet Commonly known as:  Senokot-S Take 1 tablet by mouth at bedtime as needed for mild constipation.   warfarin 5 MG tablet Commonly known as:  COUMADIN TAKE AS DIRECTED BY  COUMADIN CLINIC What changed:  See the new instructions.      Follow-up Information    [name], MD. Schedule an appointment as soon as possible for a visit in 1 week(s).   Specialty:  Internal Medicine Contact information: [address] [place] [address] [phone]        Jettie Booze, MD. Schedule an appointment as soon as possible for a visit in 3 week(s).   Specialties:   Cardiology, Radiology, Interventional Cardiology Contact information: [address] [place] [address] [phone]          No Known Allergies    Procedures/Studies: Dg Chest 2 View  Result Date: 08/19/2016 CLINICAL DATA:  Shortness of breath.  History of COPD. EXAM: CHEST  2 VIEW COMPARISON:  08/18/2016 and prior radiographs FINDINGS: Cardiomegaly noted. COPD/emphysema changes again noted with right basilar scarring. There is no evidence of focal airspace disease, pulmonary edema, suspicious pulmonary nodule/mass, pleural effusion, or pneumothorax. No acute bony abnormalities are identified. IMPRESSION: No evidence of acute cardiopulmonary disease. Cardiomegaly and COPD/emphysema. Electronically Signed   By: Margarette Canada M.D.   On: 08/19/2016 15:14   Dg Chest 2 View  Result Date: 08/18/2016 CLINICAL DATA:  Short of breath and cough EXAM: CHEST  2 VIEW COMPARISON:  06/09/2016.  09/14/2015. FINDINGS: The heart remains markedly enlarged. Course chronic parenchymal changes throughout both lungs is stable. Pleural thickening at the right base is stable. Airspace disease seen on the recent chest x-ray a in the medial right middle and lower lobes has resolved appear no pneumothorax. Bullous changes at the left apex. Osteopenia. IMPRESSION: Resolved right medial lower airspace disease. Bilateral chronic fibrotic changes persist. Electronically Signed   By: Marybelle Killings M.D.   On: 08/18/2016 13:15    2-D echo Study Conclusions  - Left ventricle: The cavity size was normal. Wall thickness was   increased in a pattern of mild LVH. Systolic function was   moderately reduced. The estimated ejection fraction was in the   range of 35% to 40%. Moderate diffuse hypokinesis with no   identifiable regional variations. - Mitral valve: Calcified annulus. Mildly thickened leaflets .   There was mild to moderate regurgitation directed centrally. - Left atrium: The atrium was mildly  to moderately dilated. - Right ventricle: The cavity size was moderately dilated. Systolic   function was moderately reduced. - Right atrium: The atrium was severely dilated. - Tricuspid valve: There was moderate regurgitation. - Pulmonary arteries: Systolic pressure was mildly increased. PA   peak pressure: 34 mm Hg (S).   Subjective: Reports his breathing continues to be better. Heart rate got to 40s on the monitor with 2.5 second pause overnight.  Discharge Exam: Vitals:   08/21/16 0734 08/21/16 1110  BP: 112/77 113/73  Pulse: 75 100  Resp: (!) 26 (!) 23  Temp: 97.5 F (36.4 C) 97.4 F (36.3 C)   Vitals:   08/21/16 0734 08/21/16 0741 08/21/16 0748 08/21/16 1110  BP: 112/77   113/73  Pulse: 75   100  Resp: (!) 26   (!) 23  Temp: 97.5 F (36.4 C)   97.4 F (36.3 C)  TempSrc: Oral   Oral  SpO2: 96%  96% 93% 98%  Weight:      Height:        Gen.: Elderly male not in distress, more alert HEENT: Moist mucosa, JVD +, supple neck Chest: Improved bibasilar breath sounds, no rhonchi or wheeze CVS: S1 and S2 irregularly irregular, no murmurs or gallop GI: Soft, nondistended, nontender Musculoskeletal: Warm, improved edema. CNS: Alert and oriented   The results of significant diagnostics from this hospitalization (including imaging, microbiology, ancillary and laboratory) are listed below for reference.     Microbiology: Recent Results (from the past 240 hour(s))  Culture, blood (Routine X 2) w Reflex to ID Panel     Status: None (Preliminary result)   Collection Time: 08/18/16  5:32 PM  Result Value Ref Range Status   Specimen Description BLOOD RIGHT WRIST  Final   Special Requests   Final    BOTTLES DRAWN AEROBIC AND ANAEROBIC Blood Culture adequate volume   Culture NO GROWTH 2 DAYS  Final   Report Status PENDING  Incomplete  Culture, blood (Routine X 2) w Reflex to ID Panel     Status: None (Preliminary result)   Collection Time: 08/18/16  5:40 PM  Result Value  Ref Range Status   Specimen Description BLOOD LEFT FOREARM  Final   Special Requests   Final    BOTTLES DRAWN AEROBIC AND ANAEROBIC Blood Culture adequate volume   Culture NO GROWTH 2 DAYS  Final   Report Status PENDING  Incomplete     Labs: BNP (last 3 results)  Recent Labs  11/15/15 0335 06/09/16 2357 08/18/16 1428  BNP 205.8* 249.8* 626.9*   Basic Metabolic Panel:  Recent Labs Lab 08/18/16 1247 08/18/16 2358 08/19/16 0507 08/20/16 0550 08/21/16 0411  NA 137  --  138 138 134*  K 3.3*  --  3.4* 3.0* 3.7  CL 101  --  101 97* 96*  CO2 27  --  25 29 27   GLUCOSE 202*  --  230* 209* 379*  BUN 17  --  17 18 31*  CREATININE 1.10  --  1.03 1.18 1.35*  CALCIUM 8.5*  --  8.3* 8.3* 8.3*  MG  --  1.2*  --  1.6*  --    Liver Function Tests:  Recent Labs Lab 08/19/16 0507  AST 27  ALT 27  ALKPHOS 212*  BILITOT 1.7*  PROT 5.8*  ALBUMIN 2.5*   No results for input(s): LIPASE, AMYLASE in the last 168 hours. No results for input(s): AMMONIA in the last 168 hours. CBC:  Recent Labs Lab 08/18/16 1247 08/19/16 0507 08/20/16 0550 08/21/16 0411  WBC 12.9* 13.0* 12.9* 14.7*  HGB 14.9 15.0 14.9 14.7  HCT 44.5 45.1 45.1 44.4  MCV 89.7 90.7 89.8 89.7  PLT 405* 418* 414* 455*   Cardiac Enzymes:  Recent Labs Lab 08/18/16 1740 08/18/16 2358 08/19/16 0507  TROPONINI 0.03* <0.03 <0.03   BNP: Invalid input(s): POCBNP CBG:  Recent Labs Lab 08/20/16 1153 08/20/16 1625 08/20/16 2041 08/21/16 0733 08/21/16 1109  GLUCAP 215* 278* 416* 364* 290*   D-Dimer No results for input(s): DDIMER in the last 72 hours. Hgb A1c  Recent Labs  08/18/16 2358  HGBA1C 8.9*   Lipid Profile No results for input(s): CHOL, HDL, LDLCALC, TRIG, CHOLHDL, LDLDIRECT in the last 72 hours. Thyroid function studies  Recent Labs  08/18/16 1637  TSH 2.180   Anemia work up No results for input(s): VITAMINB12, FOLATE, FERRITIN, TIBC, IRON, RETICCTPCT in the last 72  hours. Urinalysis  Component Value Date/Time   COLORURINE YELLOW 08/18/2016 1509   APPEARANCEUR CLEAR 08/18/2016 1509   LABSPEC 1.017 08/18/2016 1509   PHURINE 5.0 08/18/2016 1509   GLUCOSEU NEGATIVE 08/18/2016 1509   HGBUR NEGATIVE 08/18/2016 1509   BILIRUBINUR NEGATIVE 08/18/2016 1509   KETONESUR NEGATIVE 08/18/2016 1509   PROTEINUR 30 (A) 08/18/2016 1509   UROBILINOGEN 4.0 (H) 12/27/2013 2225   NITRITE NEGATIVE 08/18/2016 1509   LEUKOCYTESUR NEGATIVE 08/18/2016 1509   Sepsis Labs Invalid input(s): PROCALCITONIN,  WBC,  LACTICIDVEN Microbiology Recent Results (from the past 240 hour(s))  Culture, blood (Routine X 2) w Reflex to ID Panel     Status: None (Preliminary result)   Collection Time: 08/18/16  5:32 PM  Result Value Ref Range Status   Specimen Description BLOOD RIGHT WRIST  Final   Special Requests   Final    BOTTLES DRAWN AEROBIC AND ANAEROBIC Blood Culture adequate volume   Culture NO GROWTH 2 DAYS  Final   Report Status PENDING  Incomplete  Culture, blood (Routine X 2) w Reflex to ID Panel     Status: None (Preliminary result)   Collection Time: 08/18/16  5:40 PM  Result Value Ref Range Status   Specimen Description BLOOD LEFT FOREARM  Final   Special Requests   Final    BOTTLES DRAWN AEROBIC AND ANAEROBIC Blood Culture adequate volume   Culture NO GROWTH 2 DAYS  Final   Report Status PENDING  Incomplete     Time coordinating discharge: Over 30 minutes  SIGNED:   Louellen Molder, MD  [facility] 08/21/2016, 1:39 PM Pager   If 7PM-7AM, please contact night-coverage [email or web address] Password [email or web address]

## 2016-08-21 NOTE — Discharge Instructions (Addendum)
Heart Failure °Heart failure is a condition in which the heart has trouble pumping blood because it has become weak or stiff. This means that the heart does not pump blood efficiently for the body to work well. For some people with heart failure, fluid may back up into the lungs and there may be swelling (edema) in the lower legs. Heart failure is usually a long-term (chronic) condition. It is important for you to take good care of yourself and follow the treatment plan from your health care provider. °What are the causes? °This condition is caused by some health problems, including: °· High blood pressure (hypertension). Hypertension causes the heart muscle to work harder than normal. High blood pressure eventually causes the heart to become stiff and weak. °· Coronary artery disease (CAD). CAD is the buildup of cholesterol and fat (plaques) in the arteries of the heart. °· Heart attack (myocardial infarction). Injured tissue, which is caused by the heart attack, does not contract as well and the heart's ability to pump blood is weakened. °· Abnormal heart valves. When the heart valves do not open and close properly, the heart muscle must pump harder to keep the blood flowing. °· Heart muscle disease (cardiomyopathy or myocarditis). Heart muscle disease is damage to the heart muscle from a variety of causes, such as drug or alcohol abuse, infections, or unknown causes. These can increase the risk of heart failure. °· Lung disease. When the lungs do not work properly, the heart must work harder. ° °What increases the risk? °Risk of heart failure increases as a person ages. This condition is also more likely to develop in people who: °· Are overweight. °· Are male. °· Smoke or chew tobacco. °· Abuse alcohol or illegal drugs. °· Have taken medicines that can damage the heart, such as chemotherapy drugs. °· Have diabetes. °? High blood sugar (glucose) is associated with high fat (lipid) levels in the blood. °? Diabetes  can also damage tiny blood vessels that carry nutrients to the heart muscle. °· Have abnormal heart rhythms. °· Have thyroid problems. °· Have low blood counts (anemia). ° °What are the signs or symptoms? °Symptoms of this condition include: °· Shortness of breath with activity, such as when climbing stairs. °· Persistent cough. °· Swelling of the feet, ankles, legs, or abdomen. °· Unexplained weight gain. °· Difficulty breathing when lying flat (orthopnea). °· Waking from sleep because of the need to sit up and get more air. °· Rapid heartbeat. °· Fatigue and loss of energy. °· Feeling light-headed, dizzy, or close to fainting. °· Loss of appetite. °· Nausea. °· Increased urination during the night (nocturia). °· Confusion. ° °How is this diagnosed? °This condition is diagnosed based on: °· Medical history, symptoms, and a physical exam. °· Diagnostic tests, which may include: °? Echocardiogram. °? Electrocardiogram (ECG). °? Chest X-ray. °? Blood tests. °? Exercise stress test. °? Radionuclide scans. °? Cardiac catheterization and angiogram. ° °How is this treated? °Treatment for this condition is aimed at managing the symptoms of heart failure. Medicines, behavioral changes, or other treatments may be necessary to treat heart failure. °Medicines °These may include: °· Angiotensin-converting enzyme (ACE) inhibitors. This type of medicine blocks the effects of a blood protein called angiotensin-converting enzyme. ACE inhibitors relax (dilate) the blood vessels and help to lower blood pressure. °· Angiotensin receptor blockers (ARBs). This type of medicine blocks the actions of a blood protein called angiotensin. ARBs dilate the blood vessels and help to lower blood pressure. °· Water   pills (diuretics). Diuretics cause the kidneys to remove salt and water from the blood. The extra fluid is removed through urination, leaving a lower volume of blood that the heart has to pump. °· Beta blockers. These improve heart  muscle strength and they prevent the heart from beating too quickly. °· Digoxin. This increases the force of the heartbeat. ° °Healthy behavior changes °These may include: °· Reaching and maintaining a healthy weight. °· Stopping smoking or chewing tobacco. °· Eating heart-healthy foods. °· Limiting or avoiding alcohol. °· Stopping use of street drugs (illegal drugs). °· Physical activity. ° °Other treatments °These may include: °· Surgery to open blocked coronary arteries or repair damaged heart valves. °· Placement of a biventricular pacemaker to improve heart muscle function (cardiac resynchronization therapy). This device paces both the right ventricle and left ventricle. °· Placement of a device to treat serious abnormal heart rhythms (implantable cardioverter defibrillator, or ICD). °· Placement of a device to improve the pumping ability of the heart (left ventricular assist device, or LVAD). °· Heart transplant. This can cure heart failure, and it is considered for certain patients who do not improve with other therapies. ° °Follow these instructions at home: °Medicines °· Take over-the-counter and prescription medicines only as told by your health care provider. Medicines are important in reducing the workload of your heart, slowing the progression of heart failure, and improving your symptoms. °? Do not stop taking your medicine unless your health care provider told you to do that. °? Do not skip any dose of medicine. °? Refill your prescriptions before you run out of medicine. You need your medicines every day. °Eating and drinking ° °· Eat heart-healthy foods. Talk with a dietitian to make an eating plan that is right for you. °? Choose foods that contain no trans fat and are low in saturated fat and cholesterol. Healthy choices include fresh or frozen fruits and vegetables, fish, lean meats, legumes, fat-free or low-fat dairy products, and whole-grain or high-fiber foods. °? Limit salt (sodium) if  directed by your health care provider. Sodium restriction may reduce symptoms of heart failure. Ask a dietitian to recommend heart-healthy seasonings. °? Use healthy cooking methods instead of frying. Healthy methods include roasting, grilling, broiling, baking, poaching, steaming, and stir-frying. °· Limit your fluid intake if directed by your health care provider. Fluid restriction may reduce symptoms of heart failure. °Lifestyle °· Stop smoking or using chewing tobacco. Nicotine and tobacco can damage your heart and your blood vessels. Do not use nicotine gum or patches before talking to your health care provider. °· Limit alcohol intake to no more than 1 drink per day for non-pregnant women and 2 drinks per day for men. One drink equals 12 oz of beer, 5 oz of wine, or 1½ oz of hard liquor. °? Drinking more than that is harmful to your heart. Tell your health care provider if you drink alcohol several times a week. °? Talk with your health care provider about whether any level of alcohol use is safe for you. °? If your heart has already been damaged by alcohol or you have severe heart failure, drinking alcohol should be stopped completely. °· Stop use of illegal drugs. °· Lose weight if directed by your health care provider. Weight loss may reduce symptoms of heart failure. °· Do moderate physical activity if directed by your health care provider. People who are elderly and people with severe heart failure should consult with a health care provider for physical activity recommendations. °  Monitor important information  Weigh yourself every day. Keeping track of your weight daily helps you to notice excess fluid sooner. ? Weigh yourself every morning after you urinate and before you eat breakfast. ? Wear the same amount of clothing each time you weigh yourself. ? Record your daily weight. Provide your health care provider with your weight record.  Monitor and record your blood pressure as told by your health  care provider.  Check your pulse as told by your health care provider. Dealing with extreme temperatures  If the weather is extremely hot: ? Avoid vigorous physical activity. ? Use air conditioning or fans or seek a cooler location. ? Avoid caffeine and alcohol. ? Wear loose-fitting, lightweight, and light-colored clothing.  If the weather is extremely cold: ? Avoid vigorous physical activity. ? Layer your clothes. ? Wear mittens or gloves, a hat, and a scarf when you go outside. ? Avoid alcohol. General instructions  Manage other health conditions such as hypertension, diabetes, thyroid disease, or abnormal heart rhythms as told by your health care provider.  Learn to manage stress. If you need help to do this, ask your health care provider.  Plan rest periods when fatigued.  Get ongoing education and support as needed.  Participate in or seek rehabilitation as needed to maintain or improve independence and quality of life.  Stay up to date with immunizations. Keeping current on pneumococcal and influenza immunizations is especially important to prevent respiratory infections.  Keep all follow-up visits as told by your health care provider. This is important. Contact a health care provider if:  You have a rapid weight gain.  You have increasing shortness of breath that is unusual for you.  You are unable to participate in your usual physical activities.  You tire easily.  You cough more than normal, especially with physical activity.  You have any swelling or more swelling in areas such as your hands, feet, ankles, or abdomen.  You are unable to sleep because it is hard to breathe.  You feel like your heart is beating quickly (palpitations).  You become dizzy or light-headed when you stand up. Get help right away if:  You have difficulty breathing.  You notice or your family notices a change in your awareness, such as having trouble staying awake or having  difficulty with concentration.  You have pain or discomfort in your chest.  You have an episode of fainting (syncope). This information is not intended to replace advice given to you by your health care provider. Make sure you discuss any questions you have with your health care  provider. Document Released: 03/06/2005 Document Revised: 11/09/2015 Document Reviewed: 09/29/2015  ==================================================================================  Information on my medicine - Coumadin   (Warfarin)  This medication education was reviewed with me or my healthcare representative as part of my discharge preparation.    Why was Coumadin prescribed for you? Coumadin was prescribed for you because you have a blood clot or a medical condition that can cause an increased risk of forming blood clots. Blood clots can cause serious health problems by blocking the flow of blood to the heart, lung, or brain. Coumadin can prevent harmful blood clots from forming. As a reminder your indication for Coumadin is:   Stroke Prevention Because Of Atrial Fibrillation  What test will check on my response to Coumadin? While on Coumadin (warfarin) you will need to have an INR test regularly to ensure that your dose is keeping you in the desired range. The INR (  international normalized ratio) number is calculated from the result of the laboratory test called prothrombin time (PT).  If an INR APPOINTMENT HAS NOT ALREADY BEEN MADE FOR YOU please schedule an appointment to have this lab work done by your health care provider within 7 days. Your INR goal is usually a number between:  2 to 3 or your provider may give you a more narrow range like 2-2.5.  Ask your health care provider during an office visit what your goal INR is.  What  do you need to  know  About  COUMADIN? Take Coumadin (warfarin) exactly as prescribed by your healthcare provider about the same time each day.  DO NOT stop taking without  talking to the doctor who prescribed the medication.  Stopping without other blood clot prevention medication to take the place of Coumadin may increase your risk of developing a new clot or stroke.  Get refills before you run out.  What do you do if you miss a dose? If you miss a dose, take it as soon as you remember on the same day then continue your regularly scheduled regimen the next day.  Do not take two doses of Coumadin at the same time.  Important Safety Information A possible side effect of Coumadin (Warfarin) is an increased risk of bleeding. You should call your healthcare provider right away if you experience any of the following: ? Bleeding from an injury or your nose that does not stop. ? Unusual colored urine (red or dark brown) or unusual colored stools (red or black). ? Unusual bruising for unknown reasons. ? A serious fall or if you hit your head (even if there is no bleeding).  Some foods or medicines interact with Coumadin (warfarin) and might alter your response to warfarin. To help avoid this: ? Eat a balanced diet, maintaining a consistent amount of Vitamin K. ? Notify your provider about major diet changes you plan to make. ? Avoid alcohol or limit your intake to 1 drink for women and 2 drinks for men per day. (1 drink is 5 oz. wine, 12 oz. beer, or 1.5 oz. liquor.)  Make sure that ANY health care provider who prescribes medication for you knows that you are taking Coumadin (warfarin).  Also make sure the healthcare provider who is monitoring your Coumadin knows when you have started a new medication including herbals and non-prescription products.  Coumadin (Warfarin)  Major Drug Interactions  Increased Warfarin Effect Decreased Warfarin Effect  Alcohol (large quantities) Antibiotics (esp. Septra/Bactrim, Flagyl, Cipro) Amiodarone (Cordarone) Aspirin (ASA) Cimetidine (Tagamet) Megestrol (Megace) NSAIDs (ibuprofen, naproxen, etc.) Piroxicam  (Feldene) Propafenone (Rythmol SR) Propranolol (Inderal) Isoniazid (INH) Posaconazole (Noxafil) Barbiturates (Phenobarbital) Carbamazepine (Tegretol) Chlordiazepoxide (Librium) Cholestyramine (Questran) Griseofulvin Oral Contraceptives Rifampin Sucralfate (Carafate) Vitamin K   Coumadin (Warfarin) Major Herbal Interactions  Increased Warfarin Effect Decreased Warfarin Effect  Garlic Ginseng Ginkgo biloba Coenzyme Q10 Green tea St. Johns wort    Coumadin (Warfarin) FOOD Interactions  Eat a consistent number of servings per week of foods HIGH in Vitamin K (1 serving =  cup)  Collards (cooked, or boiled & drained) Kale (cooked, or boiled & drained) Mustard greens (cooked, or boiled & drained) Parsley *serving size only =  cup Spinach (cooked, or boiled & drained) Swiss chard (cooked, or boiled & drained) Turnip greens (cooked, or boiled & drained)  Eat a consistent number of servings per week of foods MEDIUM-HIGH in Vitamin K (1 serving = 1 cup)  Asparagus (cooked, or boiled &  drained) Broccoli (cooked, boiled & drained, or raw & chopped) Brussel sprouts (cooked, or boiled & drained) *serving size only =  cup Lettuce, raw (green leaf, endive, romaine) Spinach, raw Turnip greens, raw & chopped   These websites have more information on Coumadin (warfarin):  FailFactory.se; VeganReport.com.au;   Chartered certified accountant Patient Education  [employer].

## 2016-08-21 NOTE — Progress Notes (Signed)
Inpatient Diabetes Program Recommendations  AACE/ADA: New Consensus Statement on Inpatient Glycemic Control (2015)  Target Ranges:  Prepandial:   less than 140 mg/dL      Peak postprandial:   less than 180 mg/dL (1-2 hours)      Critically ill patients:  140 - 180 mg/dL   Lab Results  Component Value Date   GLUCAP 364 (H) 08/21/2016   HGBA1C 8.9 (H) 08/18/2016    Review of Glycemic Control:  Results for DAINE, Justin Garner (MRN [id number]) as of 08/21/2016 09:46  Ref. Range 08/20/2016 07:32 08/20/2016 11:53 08/20/2016 16:25 08/20/2016 20:41 08/21/2016 07:33  Glucose-Capillary Latest Ref Range: 65 - 99 mg/dL 219 (H) 215 (H) 278 (H) 416 (H) 364 (H)   Diabetes history: Type 2 diabetes Outpatient Diabetes medications: Glipizide 10 mg daily Current orders for Inpatient glycemic control:  Novolog moderate tid with meals and HS, Glucotrol 10 mg daily  Inpatient Diabetes Program Recommendations:   Note that blood sugars increased.  Consider adding Lantus 12 units daily and Novolog 3 units tid with meals (hold if patient eats less than 50%).   Thanks, Adah Perl, RN, BC-ADM Inpatient Diabetes Coordinator Pager [phone] (8a-5p)

## 2016-08-21 NOTE — Progress Notes (Signed)
08/21/2016 SATURATION QUALIFICATIONS: (This note is used to comply with regulatory documentation for home oxygen)  Patient Saturations on Room Air at Rest = 90%  Patient Saturations on Room Air while Ambulating = 84%  Patient Saturations on 3 Liters of oxygen while Ambulating = 91%  Please briefly explain why patient needs home oxygen: Pt cannot consistently maintain O2 sats on RA while mobilizing.    Barbarann Ehlers [place], [place], DPT [phone]

## 2016-08-21 NOTE — Progress Notes (Signed)
RT changed HFNC to 4L [place] due to stable sats

## 2016-08-21 NOTE — Care Management Note (Signed)
Case Management Note  Patient Details  Name: Justin Garner MRN: [id number] Date of Birth: 17-Nov-1932  Subjective/Objective:  Pt presented for COPD Exacerbation. Pt is from home with wife. Plan to return home 08-21-16 with [facility] Services and DME 02. Pt is refusing DME 3n1.                  Action/Plan: Pt is agreeable to have DME 02 delivered via [facility]. Agency List provided for [employer]. Pt chose [facility] for [facility] RN/ PT Services. Referral made to [facility] [place]. SOC to begin within 24-48 hours post d/c. No further needs from CM at this time.   Expected Discharge Date:  08/21/16               Expected Discharge Plan:  [place]  In-House Referral:  NA  Discharge planning Services  CM Consult  Post Acute Care Choice:  Durable Medical Equipment, Home Health Choice offered to:  Patient, Spouse  DME Arranged:  Oxygen DME Agency:  [place]:  RN, PT [facility] Agency:  [facility] (now [facility])  Status of Service:  Completed, signed off  If discussed at [place] of Stay Meetings, dates discussed:    Additional Comments:  Bethena Roys, RN 08/21/2016, 3:01 PM

## 2016-08-23 LAB — CULTURE, BLOOD (ROUTINE X 2)
CULTURE: NO GROWTH
Culture: NO GROWTH
SPECIAL REQUESTS: ADEQUATE
Special Requests: ADEQUATE

## 2016-08-24 ENCOUNTER — Ambulatory Visit (INDEPENDENT_AMBULATORY_CARE_PROVIDER_SITE_OTHER): Payer: Medicare Other

## 2016-08-24 DIAGNOSIS — I482 Chronic atrial fibrillation, unspecified: Secondary | ICD-10-CM

## 2016-08-24 DIAGNOSIS — Z5181 Encounter for therapeutic drug level monitoring: Secondary | ICD-10-CM | POA: Diagnosis not present

## 2016-08-24 LAB — PROTIME-INR
INR: 6 (ref 0.8–1.2)
Prothrombin Time: 59.8 s — ABNORMAL HIGH (ref 9.1–12.0)

## 2016-08-24 LAB — POCT INR: INR: 7

## 2016-08-30 ENCOUNTER — Ambulatory Visit (INDEPENDENT_AMBULATORY_CARE_PROVIDER_SITE_OTHER): Payer: Medicare Other | Admitting: *Deleted

## 2016-08-30 DIAGNOSIS — I482 Chronic atrial fibrillation, unspecified: Secondary | ICD-10-CM

## 2016-08-30 LAB — POCT INR: INR: 1.7

## 2016-09-06 ENCOUNTER — Ambulatory Visit (INDEPENDENT_AMBULATORY_CARE_PROVIDER_SITE_OTHER): Payer: Medicare Other | Admitting: Cardiology

## 2016-09-06 DIAGNOSIS — I482 Chronic atrial fibrillation, unspecified: Secondary | ICD-10-CM

## 2016-09-06 LAB — POCT INR: INR: 2.6

## 2016-09-14 ENCOUNTER — Other Ambulatory Visit: Payer: Self-pay | Admitting: *Deleted

## 2016-09-14 ENCOUNTER — Ambulatory Visit (INDEPENDENT_AMBULATORY_CARE_PROVIDER_SITE_OTHER): Payer: Medicare Other | Admitting: Cardiology

## 2016-09-14 ENCOUNTER — Encounter: Payer: Self-pay | Admitting: Cardiology

## 2016-09-14 VITALS — BP 96/58 | HR 36 | Ht 74.0 in | Wt 176.1 lb

## 2016-09-14 DIAGNOSIS — Z79899 Other long term (current) drug therapy: Secondary | ICD-10-CM | POA: Diagnosis not present

## 2016-09-14 DIAGNOSIS — I482 Chronic atrial fibrillation, unspecified: Secondary | ICD-10-CM

## 2016-09-14 LAB — BASIC METABOLIC PANEL
BUN / CREAT RATIO: 27 — AB (ref 10–24)
BUN: 35 mg/dL — ABNORMAL HIGH (ref 8–27)
CO2: 22 mmol/L (ref 20–29)
Calcium: 9.4 mg/dL (ref 8.6–10.2)
Chloride: 99 mmol/L (ref 96–106)
Creatinine, Ser: 1.32 mg/dL — ABNORMAL HIGH (ref 0.76–1.27)
GFR calc Af Amer: 58 mL/min/{1.73_m2} — ABNORMAL LOW (ref >59)
GFR, EST NON AFRICAN AMERICAN: 50 mL/min/{1.73_m2} — AB (ref >59)
Glucose: 174 mg/dL — ABNORMAL HIGH (ref 65–99)
Potassium: 3.9 mmol/L (ref 3.5–5.2)
SODIUM: 141 mmol/L (ref 134–144)

## 2016-09-14 MED ORDER — FUROSEMIDE 20 MG PO TABS: 40.0000 mg | ORAL_TABLET | Freq: Every day | ORAL | 2 refills | Status: DC

## 2016-09-14 MED ORDER — LISINOPRIL 5 MG PO TABS: 2.5000 mg | ORAL_TABLET | Freq: Every day | ORAL | 3 refills | Status: DC

## 2016-09-14 MED ORDER — DILTIAZEM HCL ER COATED BEADS 360 MG PO CP24: 360.0000 mg | ORAL_CAPSULE | Freq: Every day | ORAL | 3 refills | Status: DC

## 2016-09-14 NOTE — Patient Instructions (Addendum)
Medication Instructions:    START TAKING LISINOPRIL 2.5 MG ONCE A DAY ( HALF OF 5 MG TABLET )  If you need a refill on your cardiac medications before your next appointment, please call your pharmacy.  Labwork: BMET TODAY    Testing/Procedures: NONE ORDERED  TODAY    Follow-Up: WITH DR Irish Lack IN 3 TO 4 MONTHS    Any Other Special Instructions Will Be Listed Below (If Applicable

## 2016-09-14 NOTE — Progress Notes (Signed)
09/14/2016 Justin Garner   29-Jan-1933  [id number]  Primary Physician [name], MD Primary Cardiologist: Dr. Irish Lack   Reason for Visit/CC: [facility] f/u for Afib w/ RVR and A/c CHF and COPD exacerbation  HPI:  Justin Garner is a 83 y.o. male who is being seen today for post hospital f/u. He has a h/o atrial fibrillation, on chronic coumadin, chronic systolic HF EF 41-28%, mild nonobstructive CAD by cath 08/2013, COPD and h/o right sided lung CA s/p resection and chemo + radiation. He is on chronic supplemental O2 at home.   He was recently admitted to the hospital for acute hypoxic respiratory failure in the setting of acute COPD exacerbation, a/c systolic HF and atrial fibrillation w/ RVR. He was admitted by IM and placed on antibiotics and steroids. Also treated with IV lasix and transitioned back to PO after diuresis. Home lasix increased from 20 mg daily to 20 mg BID. He required increasing doses of rate control meds. Given his COPD, further titration of his BB was avoided. His Cardizem was increased from 240 mg to 360 mg. He was seen by EP, Dr. Lovena Le, who agreed with increasing dose to 360 mg. He was noticed to have a 2.5 sec pause during his admission however he was asymptomatic. He was continued on lisinopril 5 mg.   He presents to clinic today for post hospital f/u. He is here with his wife. He has done well since discharge. No significant dyspnea beyond his usual baseline. No CP. He is asymptomatic with his afib. EKG shows afib with a CVR in the 80s. BP is soft at 96/58. He is a bit dizzy but denies syncope/ near syncope. No abnormal bleeding or falls with coumadin. He has been compliant with low salt diet and daily weights. His home weights have been stable. No significant weight gain. He has trace ankle edema on exam.    Current Meds  Medication Sig  . albuterol (PROVENTIL HFA;VENTOLIN HFA) 108 (90 Base) MCG/ACT inhaler Inhale 2 puffs into the lungs every 6 (six)  hours as needed for wheezing or shortness of breath.  Marland Kitchen atorvastatin (LIPITOR) 20 MG tablet Take 1 tablet (20 mg total) by mouth daily.  . budesonide-formoterol (SYMBICORT) 80-4.5 MCG/ACT inhaler Inhale 2 puffs into the lungs every morning.  Marland Kitchen dextromethorphan-guaiFENesin (MUCINEX DM) 30-600 MG 12hr tablet Take 1 tablet by mouth 2 (two) times daily as needed for cough.  . diltiazem (CARDIZEM CD) 360 MG 24 hr capsule Take 1 capsule (360 mg total) by mouth daily.  . fish oil-omega-3 fatty acids 1000 MG capsule Take 1 g by mouth daily.   . furosemide (LASIX) 20 MG tablet Take 2 tablets (40 mg total) by mouth daily.  Marland Kitchen glipiZIDE (GLUCOTROL) 10 MG tablet Take 10 mg by mouth daily.  Marland Kitchen levothyroxine (SYNTHROID, LEVOTHROID) 112 MCG tablet Take 112 mcg by mouth daily.    Marland Kitchen lisinopril (PRINIVIL,ZESTRIL) 5 MG tablet Take 1 tablet (5 mg total) by mouth daily.  . metoprolol tartrate (LOPRESSOR) 25 MG tablet Take 25 mg by mouth 2 (two) times daily.  . nitroGLYCERIN (NITROSTAT) 0.4 MG SL tablet Place 1 tablet (0.4 mg total) under the tongue every 5 (five) minutes as needed for chest pain up to 3 doses  . omeprazole (PRILOSEC) 20 MG capsule Take 20 mg by mouth daily.    Marland Kitchen senna-docusate (SENOKOT-S) 8.6-50 MG tablet Take 1 tablet by mouth at bedtime as needed for mild constipation.  Marland Kitchen warfarin (COUMADIN) 5 MG tablet  Take 5 mg by mouth. Use as directed.  . [DISCONTINUED] metoprolol tartrate (LOPRESSOR) 25 MG tablet TAKE 1/2 TABLET BY MOUTH TWICE A DAY (Patient taking differently: TAKE 1 TABLET (25mg ) BY MOUTH TWICE A DAY)  . [DISCONTINUED] warfarin (COUMADIN) 5 MG tablet TAKE AS DIRECTED BY  COUMADIN CLINIC (Patient taking differently: 7.5mg  M T W FRI SAT and 5mg  SUN THUR or AS DIRECTED BY  COUMADIN CLINIC)   No Known Allergies Past Medical History:  Diagnosis Date  . ASCVD (arteriosclerotic cardiovascular disease)   . Cancer [facility]) 2009   Upper lobectomy;radiation; chemotherapy  . Chronic systolic CHF  (congestive heart failure) ([place])   . CKD (chronic kidney disease), stage II   . COPD (chronic obstructive pulmonary disease) ([place])   . Coronary artery disease   . Diabetes mellitus   . GERD (gastroesophageal reflux disease)   . Hyperlipidemia   . Hypertension   . Leukocytosis 07/12/2015  . PAF (paroxysmal atrial fibrillation) (HCC)    Chronic warfarin therapy  . Primary cancer of right upper lobe of lung ([place]) 06/30/2013   RUL lobectomy 2009 with chest wall resection for superior sulcus tumor Postoperative radiation and chemotherapy; Dr Earlie Server Oncology monitor [phone]  . Pulmonary embolism (HCC)    PMH of  . Thyroid disease    Family History  Problem Relation Age of Onset  . Heart disease Father        Heart attack  . Heart attack Father   . Diabetes Brother   . Cancer Neg Hx   . Stroke Neg Hx   . Hypertension Neg Hx    Past Surgical History:  Procedure Laterality Date  . CAROTID STENT    . CHOLECYSTECTOMY    . FIBEROPTIC BRONCHOSCOPY AND MEIASTINOSCOPY  06/07/2007  . HERNIA REPAIR    . INGUINAL HERNIA REPAIR  10/10/2010  . LEFT HEART CATHETERIZATION WITH CORONARY ANGIOGRAM N/A 08/19/2013   Procedure: LEFT HEART CATHETERIZATION WITH CORONARY ANGIOGRAM;  Surgeon: Jettie Booze, MD;  Location: [facility] CATH LAB;  Service: Cardiovascular;  Laterality: N/A;  . R VATS,THORACOTOMY AND UPPER LOBECTOMY  08/02/2007   Social History   Social History  . Marital status: Married    Spouse name: N/A  . Number of children: N/A  . Years of education: N/A   Occupational History  . retired    Social History Main Topics  . Smoking status: Former Smoker    Packs/day: 1.00    Years: 50.00    Types: Cigarettes    Quit date: 03/20/2001  . Smokeless tobacco: Former Systems developer  . Alcohol use No  . Drug use: No  . Sexual activity: Not on file   Other Topics Concern  . Not on file   Social History Narrative  . No narrative on file     Review of Systems: General: negative for chills,  fever, night sweats or weight changes.  Cardiovascular: negative for chest pain, dyspnea on exertion, edema, orthopnea, palpitations, paroxysmal nocturnal dyspnea or shortness of breath Dermatological: negative for rash Respiratory: negative for cough or wheezing Urologic: negative for hematuria Abdominal: negative for nausea, vomiting, diarrhea, bright red blood per rectum, melena, or hematemesis Neurologic: negative for visual changes, syncope, or dizziness All other systems reviewed and are otherwise negative except as noted above.   Physical Exam:  Blood pressure (!) 96/58, pulse (!) 36, height 6\' 2"  (1.88 m), weight 176 lb 1.9 oz (79.9 kg).  General appearance: alert, cooperative and no distress Neck: no carotid bruit and no JVD  Lungs: clear to auscultation bilaterally Heart: irregularly irregular, regular rate, S1, S2 normal, no murmur, click, rub or gallop Extremities: trace ankle edema bilaterally, atraumatic, no cyanosis  Pulses: 2+ and symmetric Skin: Skin color, texture, turgor normal. No rashes or lesions Neurologic: Grossly normal  EKG afib w/ CVR 80 bpm  -- personally reviewed   ASSESSMENT AND PLAN:   1. Afib: remains in afib with a CVR. Asymptomatic. Continue Cardizem and metoprolol for rate control and coumadin for a/c. His INRs are followed by our Coumadin clinic. No falls or abnormal bleeding.   2. Chronic Systolic HF: EF 83-66%. Volume is stable. Weight also stable. Continue lasix, 20 mg BID. We will check a f/u BMP today for monitoring of renal function and electrolytes. Continue BB and ACE-I. Continued daily weights and low sodium diet advised.   3. HTN: BP is soft but stable. He has mild dizziness. We will reduce lisinopril in half to 2.5 mg daily.   4. COPD: stable. On chronic home O2.   5. H/o lung CA: s/p resection, chemo + radiation. Followed by Dr. Julien Nordmann.   6. CAD: mild nonobstructive disease by cath in 2015. Stable w/o CP.    Follow-Up w/ Dr.  Irish Lack in 3-4 months   [place] PA-C, [facility] 09/14/2016 11:09 AM

## 2016-09-15 MED ORDER — DILTIAZEM HCL ER COATED BEADS 360 MG PO CP24: 360.0000 mg | ORAL_CAPSULE | Freq: Every day | ORAL | 3 refills | Status: DC

## 2016-09-15 NOTE — Telephone Encounter (Signed)
-----   Message from Consuelo Pandy, [place] sent at 09/15/2016  2:49 PM EDT ----- Kidney function and K both stable after increase in lasix. Continue lasix as ordered.

## 2016-09-19 ENCOUNTER — Ambulatory Visit (INDEPENDENT_AMBULATORY_CARE_PROVIDER_SITE_OTHER): Payer: Self-pay | Admitting: Cardiology

## 2016-09-19 DIAGNOSIS — I482 Chronic atrial fibrillation, unspecified: Secondary | ICD-10-CM

## 2016-09-19 LAB — POCT INR: INR: 2.3

## 2016-10-02 ENCOUNTER — Other Ambulatory Visit: Payer: Self-pay | Admitting: Interventional Cardiology

## 2016-10-03 ENCOUNTER — Ambulatory Visit (INDEPENDENT_AMBULATORY_CARE_PROVIDER_SITE_OTHER): Payer: Medicare Other

## 2016-10-03 ENCOUNTER — Other Ambulatory Visit: Payer: Self-pay | Admitting: Interventional Cardiology

## 2016-10-03 DIAGNOSIS — I482 Chronic atrial fibrillation, unspecified: Secondary | ICD-10-CM

## 2016-10-03 LAB — POCT INR: INR: 2.7

## 2016-10-23 LAB — POCT INR: INR: 2.4

## 2016-10-24 ENCOUNTER — Ambulatory Visit (INDEPENDENT_AMBULATORY_CARE_PROVIDER_SITE_OTHER): Payer: Medicare Other | Admitting: Interventional Cardiology

## 2016-10-24 DIAGNOSIS — I482 Chronic atrial fibrillation, unspecified: Secondary | ICD-10-CM

## 2016-11-10 ENCOUNTER — Encounter: Payer: Self-pay | Admitting: Pharmacist

## 2016-11-21 ENCOUNTER — Ambulatory Visit (INDEPENDENT_AMBULATORY_CARE_PROVIDER_SITE_OTHER): Payer: Medicare Other

## 2016-11-21 DIAGNOSIS — I482 Chronic atrial fibrillation, unspecified: Secondary | ICD-10-CM

## 2016-11-21 DIAGNOSIS — Z5181 Encounter for therapeutic drug level monitoring: Secondary | ICD-10-CM | POA: Diagnosis not present

## 2016-11-21 LAB — POCT INR: INR: 2.6

## 2016-12-12 ENCOUNTER — Ambulatory Visit (INDEPENDENT_AMBULATORY_CARE_PROVIDER_SITE_OTHER): Payer: Medicare Other | Admitting: Cardiology

## 2016-12-12 DIAGNOSIS — I2699 Other pulmonary embolism without acute cor pulmonale: Secondary | ICD-10-CM

## 2016-12-12 DIAGNOSIS — I482 Chronic atrial fibrillation, unspecified: Secondary | ICD-10-CM

## 2016-12-12 DIAGNOSIS — Z5181 Encounter for therapeutic drug level monitoring: Secondary | ICD-10-CM | POA: Diagnosis not present

## 2016-12-12 LAB — POCT INR: INR: 2.7

## 2016-12-20 ENCOUNTER — Encounter (HOSPITAL_COMMUNITY): Payer: Self-pay | Admitting: Emergency Medicine

## 2016-12-20 ENCOUNTER — Emergency Department (HOSPITAL_COMMUNITY): Payer: Medicare Other

## 2016-12-20 ENCOUNTER — Inpatient Hospital Stay (HOSPITAL_COMMUNITY)
Admission: EM | Admit: 2016-12-20 | Discharge: 2016-12-23 | DRG: 871 | Disposition: A | Discharge status: Home / Self Care | Payer: Medicare Other | Attending: Internal Medicine | Admitting: Internal Medicine

## 2016-12-20 DIAGNOSIS — I5042 Chronic combined systolic (congestive) and diastolic (congestive) heart failure: Secondary | ICD-10-CM | POA: Diagnosis present

## 2016-12-20 DIAGNOSIS — Z9221 Personal history of antineoplastic chemotherapy: Secondary | ICD-10-CM

## 2016-12-20 DIAGNOSIS — Z85118 Personal history of other malignant neoplasm of bronchus and lung: Secondary | ICD-10-CM

## 2016-12-20 DIAGNOSIS — Z6824 Body mass index (BMI) 24.0-24.9, adult: Secondary | ICD-10-CM

## 2016-12-20 DIAGNOSIS — I482 Chronic atrial fibrillation, unspecified: Secondary | ICD-10-CM | POA: Diagnosis present

## 2016-12-20 DIAGNOSIS — I2510 Atherosclerotic heart disease of native coronary artery without angina pectoris: Secondary | ICD-10-CM | POA: Diagnosis present

## 2016-12-20 DIAGNOSIS — J189 Pneumonia, unspecified organism: Secondary | ICD-10-CM | POA: Diagnosis present

## 2016-12-20 DIAGNOSIS — K219 Gastro-esophageal reflux disease without esophagitis: Secondary | ICD-10-CM | POA: Diagnosis present

## 2016-12-20 DIAGNOSIS — Z902 Acquired absence of lung [part of]: Secondary | ICD-10-CM

## 2016-12-20 DIAGNOSIS — A419 Sepsis, unspecified organism: Secondary | ICD-10-CM | POA: Diagnosis not present

## 2016-12-20 DIAGNOSIS — I2699 Other pulmonary embolism without acute cor pulmonale: Secondary | ICD-10-CM | POA: Diagnosis not present

## 2016-12-20 DIAGNOSIS — I130 Hypertensive heart and chronic kidney disease with heart failure and stage 1 through stage 4 chronic kidney disease, or unspecified chronic kidney disease: Secondary | ICD-10-CM | POA: Diagnosis present

## 2016-12-20 DIAGNOSIS — Z923 Personal history of irradiation: Secondary | ICD-10-CM | POA: Diagnosis not present

## 2016-12-20 DIAGNOSIS — I5022 Chronic systolic (congestive) heart failure: Secondary | ICD-10-CM | POA: Diagnosis present

## 2016-12-20 DIAGNOSIS — E43 Unspecified severe protein-calorie malnutrition: Secondary | ICD-10-CM | POA: Diagnosis present

## 2016-12-20 DIAGNOSIS — C3411 Malignant neoplasm of upper lobe, right bronchus or lung: Secondary | ICD-10-CM | POA: Diagnosis present

## 2016-12-20 DIAGNOSIS — N189 Chronic kidney disease, unspecified: Secondary | ICD-10-CM

## 2016-12-20 DIAGNOSIS — N182 Chronic kidney disease, stage 2 (mild): Secondary | ICD-10-CM | POA: Diagnosis not present

## 2016-12-20 DIAGNOSIS — Z87891 Personal history of nicotine dependence: Secondary | ICD-10-CM | POA: Diagnosis not present

## 2016-12-20 DIAGNOSIS — E785 Hyperlipidemia, unspecified: Secondary | ICD-10-CM | POA: Diagnosis present

## 2016-12-20 DIAGNOSIS — Z95828 Presence of other vascular implants and grafts: Secondary | ICD-10-CM

## 2016-12-20 DIAGNOSIS — I480 Paroxysmal atrial fibrillation: Secondary | ICD-10-CM | POA: Diagnosis present

## 2016-12-20 DIAGNOSIS — N179 Acute kidney failure, unspecified: Secondary | ICD-10-CM | POA: Diagnosis present

## 2016-12-20 DIAGNOSIS — E1122 Type 2 diabetes mellitus with diabetic chronic kidney disease: Secondary | ICD-10-CM | POA: Diagnosis not present

## 2016-12-20 DIAGNOSIS — J440 Chronic obstructive pulmonary disease with acute lower respiratory infection: Secondary | ICD-10-CM | POA: Diagnosis present

## 2016-12-20 DIAGNOSIS — I10 Essential (primary) hypertension: Secondary | ICD-10-CM | POA: Diagnosis not present

## 2016-12-20 DIAGNOSIS — Z7984 Long term (current) use of oral hypoglycemic drugs: Secondary | ICD-10-CM

## 2016-12-20 DIAGNOSIS — E039 Hypothyroidism, unspecified: Secondary | ICD-10-CM | POA: Diagnosis present

## 2016-12-20 DIAGNOSIS — E876 Hypokalemia: Secondary | ICD-10-CM | POA: Diagnosis present

## 2016-12-20 DIAGNOSIS — J181 Lobar pneumonia, unspecified organism: Secondary | ICD-10-CM | POA: Diagnosis not present

## 2016-12-20 DIAGNOSIS — Z7901 Long term (current) use of anticoagulants: Secondary | ICD-10-CM

## 2016-12-20 DIAGNOSIS — Z7951 Long term (current) use of inhaled steroids: Secondary | ICD-10-CM

## 2016-12-20 DIAGNOSIS — Z86711 Personal history of pulmonary embolism: Secondary | ICD-10-CM

## 2016-12-20 DIAGNOSIS — T502X5A Adverse effect of carbonic-anhydrase inhibitors, benzothiadiazides and other diuretics, initial encounter: Secondary | ICD-10-CM | POA: Diagnosis present

## 2016-12-20 DIAGNOSIS — Z79899 Other long term (current) drug therapy: Secondary | ICD-10-CM

## 2016-12-20 LAB — CBC WITH DIFFERENTIAL/PLATELET
BASOS ABS: 0 10*3/uL (ref 0.0–0.1)
BASOS PCT: 0 %
Eosinophils Absolute: 0.1 10*3/uL (ref 0.0–0.7)
Eosinophils Relative: 0 %
HEMATOCRIT: 48.1 % (ref 39.0–52.0)
Hemoglobin: 16.2 g/dL (ref 13.0–17.0)
LYMPHS PCT: 10 %
Lymphs Abs: 1.5 10*3/uL (ref 0.7–4.0)
MCH: 31 pg (ref 26.0–34.0)
MCHC: 33.7 g/dL (ref 30.0–36.0)
MCV: 92 fL (ref 78.0–100.0)
MONO ABS: 1.2 10*3/uL — AB (ref 0.1–1.0)
Monocytes Relative: 8 %
NEUTROS ABS: 13.2 10*3/uL — AB (ref 1.7–7.7)
NEUTROS PCT: 82 %
Platelets: 241 10*3/uL (ref 150–400)
RBC: 5.23 MIL/uL (ref 4.22–5.81)
RDW: 14.8 % (ref 11.5–15.5)
WBC: 16 10*3/uL — AB (ref 4.0–10.5)

## 2016-12-20 LAB — I-STAT CG4 LACTIC ACID, ED: Lactic Acid, Venous: 2.75 mmol/L (ref 0.5–1.9)

## 2016-12-20 LAB — BASIC METABOLIC PANEL
ANION GAP: 13 (ref 5–15)
BUN: 29 mg/dL — ABNORMAL HIGH (ref 6–20)
CALCIUM: 8.6 mg/dL — AB (ref 8.9–10.3)
CO2: 23 mmol/L (ref 22–32)
Chloride: 103 mmol/L (ref 101–111)
Creatinine, Ser: 1.42 mg/dL — ABNORMAL HIGH (ref 0.61–1.24)
GFR, EST AFRICAN AMERICAN: 52 mL/min — AB (ref >60)
GFR, EST NON AFRICAN AMERICAN: 44 mL/min — AB (ref >60)
Glucose, Bld: 191 mg/dL — ABNORMAL HIGH (ref 65–99)
POTASSIUM: 3.9 mmol/L (ref 3.5–5.1)
Sodium: 139 mmol/L (ref 135–145)

## 2016-12-20 LAB — I-STAT TROPONIN, ED: Troponin i, poc: 0.02 ng/mL (ref 0.00–0.08)

## 2016-12-20 LAB — PROTIME-INR
INR: 2.28
PROTHROMBIN TIME: 24.9 s — AB (ref 11.4–15.2)

## 2016-12-20 LAB — BRAIN NATRIURETIC PEPTIDE: B NATRIURETIC PEPTIDE 5: 231.1 pg/mL — AB (ref 0.0–100.0)

## 2016-12-20 MED ORDER — HYDRALAZINE HCL 20 MG/ML IJ SOLN: 5.0000 mg | INTRAMUSCULAR | Status: DC | PRN

## 2016-12-20 MED ORDER — SODIUM CHLORIDE 0.9 % IV BOLUS (SEPSIS)
1000.0000 mL | Freq: Once | INTRAVENOUS | Status: AC
  Administered 2016-12-20: 1000 mL via INTRAVENOUS

## 2016-12-20 MED ORDER — DEXTROSE 5 % IV SOLN
500.0000 mg | Freq: Once | INTRAVENOUS | Status: AC
  Administered 2016-12-20: 500 mg via INTRAVENOUS
  Filled 2016-12-20: qty 500

## 2016-12-20 MED ORDER — LEVOTHYROXINE SODIUM 112 MCG PO TABS
112.0000 ug | ORAL_TABLET | Freq: Every day | ORAL | Status: DC
  Administered 2016-12-21 – 2016-12-23 (×3): 112 ug via ORAL
  Filled 2016-12-20 (×3): qty 1

## 2016-12-20 MED ORDER — DEXTROSE 5 % IV SOLN
1.0000 g | INTRAVENOUS | Status: DC
  Administered 2016-12-21 – 2016-12-22 (×2): 1 g via INTRAVENOUS
  Filled 2016-12-20 (×2): qty 10

## 2016-12-20 MED ORDER — PANTOPRAZOLE SODIUM 40 MG PO TBEC
40.0000 mg | DELAYED_RELEASE_TABLET | Freq: Every day | ORAL | Status: DC
  Administered 2016-12-21 – 2016-12-23 (×3): 40 mg via ORAL
  Filled 2016-12-20 (×3): qty 1

## 2016-12-20 MED ORDER — ALBUTEROL SULFATE (2.5 MG/3ML) 0.083% IN NEBU: 2.5000 mg | INHALATION_SOLUTION | RESPIRATORY_TRACT | Status: DC | PRN

## 2016-12-20 MED ORDER — ATORVASTATIN CALCIUM 20 MG PO TABS
20.0000 mg | ORAL_TABLET | Freq: Every day | ORAL | Status: DC
  Administered 2016-12-21 – 2016-12-23 (×3): 20 mg via ORAL
  Filled 2016-12-20 (×3): qty 1

## 2016-12-20 MED ORDER — SODIUM CHLORIDE 0.9 % IV BOLUS (SEPSIS): 500.0000 mL | Freq: Once | INTRAVENOUS | Status: DC

## 2016-12-20 MED ORDER — METOPROLOL TARTRATE 12.5 MG HALF TABLET
12.5000 mg | ORAL_TABLET | Freq: Two times a day (BID) | ORAL | Status: DC
  Administered 2016-12-21 – 2016-12-23 (×6): 12.5 mg via ORAL
  Filled 2016-12-20 (×6): qty 1

## 2016-12-20 MED ORDER — NITROGLYCERIN 0.4 MG SL SUBL: 0.4000 mg | SUBLINGUAL_TABLET | SUBLINGUAL | Status: DC | PRN

## 2016-12-20 MED ORDER — ACETAMINOPHEN 325 MG PO TABS: 650.0000 mg | ORAL_TABLET | Freq: Four times a day (QID) | ORAL | Status: DC | PRN

## 2016-12-20 MED ORDER — OMEGA-3-ACID ETHYL ESTERS 1 G PO CAPS
1.0000 g | ORAL_CAPSULE | Freq: Every day | ORAL | Status: DC
  Administered 2016-12-21 – 2016-12-23 (×3): 1 g via ORAL
  Filled 2016-12-20 (×3): qty 1

## 2016-12-20 MED ORDER — DM-GUAIFENESIN ER 30-600 MG PO TB12: 1.0000 | ORAL_TABLET | Freq: Two times a day (BID) | ORAL | Status: DC | PRN

## 2016-12-20 MED ORDER — CEFTRIAXONE SODIUM 1 G IJ SOLR
1.0000 g | Freq: Once | INTRAMUSCULAR | Status: AC
  Administered 2016-12-20: 1 g via INTRAVENOUS
  Filled 2016-12-20: qty 10

## 2016-12-20 MED ORDER — ZOLPIDEM TARTRATE 5 MG PO TABS: 5.0000 mg | ORAL_TABLET | Freq: Every evening | ORAL | Status: DC | PRN

## 2016-12-20 MED ORDER — MOMETASONE FURO-FORMOTEROL FUM 100-5 MCG/ACT IN AERO
2.0000 | INHALATION_SPRAY | Freq: Two times a day (BID) | RESPIRATORY_TRACT | Status: DC
  Administered 2016-12-21 – 2016-12-23 (×5): 2 via RESPIRATORY_TRACT
  Filled 2016-12-20: qty 8.8

## 2016-12-20 MED ORDER — ONDANSETRON HCL 4 MG/2ML IJ SOLN: 4.0000 mg | Freq: Three times a day (TID) | INTRAMUSCULAR | Status: DC | PRN

## 2016-12-20 MED ORDER — SODIUM CHLORIDE 0.9 % IV SOLN
INTRAVENOUS | Status: DC
  Administered 2016-12-21 (×2): via INTRAVENOUS

## 2016-12-20 MED ORDER — DILTIAZEM HCL ER COATED BEADS 360 MG PO CP24
360.0000 mg | ORAL_CAPSULE | Freq: Every day | ORAL | Status: DC
  Administered 2016-12-21 – 2016-12-23 (×3): 360 mg via ORAL
  Filled 2016-12-20: qty 1
  Filled 2016-12-20 (×2): qty 2
  Filled 2016-12-20 (×2): qty 1
  Filled 2016-12-20: qty 2

## 2016-12-20 MED ORDER — AZITHROMYCIN 500 MG IV SOLR
500.0000 mg | INTRAVENOUS | Status: DC
  Administered 2016-12-21 – 2016-12-22 (×2): 500 mg via INTRAVENOUS
  Filled 2016-12-20 (×2): qty 500

## 2016-12-20 NOTE — ED Triage Notes (Signed)
Patient reports chest congestion, productive cough , fatigue/generalized weakness and chills onset yesterday . History of CHF/COPD , denies fever.

## 2016-12-20 NOTE — H&P (Signed)
History and Physical    Justin Garner NUU:[id number] DOB: 1932/11/27 DOA: 12/20/2016  Referring MD/NP/PA:   PCP: [name], MD   Patient coming from:  The patient is coming from home.  At baseline, pt is independent for most of ADL.   Chief Complaint: Cough, shortness breath, chest congestion  HPI: Justin Garner is a 83 y.o. male with medical history significant of hypertension, hyperlipidemia, diabetes mellitus, COPD, GERD, hypothyroidism, PAF and PE on Coumadin, sCHF, CKD-II, lung cancer (s/p of topectomy, radiation and chemotherapy), who presents with SOB, cough and chills, chest congestion.  Patient states that he started having cough, shortness breath, chest congestion since yesterday, which has been progressively getting worse. He has chills, but no fever. He coughs up white and clear mucus. Denies chest pain. Patient does not have nausea, vomiting, diarrhea, abdominal pain, symptoms of UTI or unilateral weakness. No leg edema  ED Course: pt was found to have WBC 16.0, slightly worsening renal function, lactic acid is 2.75, BNP 231, temperature normal, no tachycardia, has tachypnea, O2 sat are 93% on room air. Chest x-ray showed left base groundglass opacity. Patient is admitted to telemetry bed as inpatient.   Review of Systems:   General: no fevers, has chills, no body weight gain, has poor appetite, has fatigue HEENT: no blurry vision, hearing changes or sore throat Respiratory: has dyspnea, coughing, no wheezing CV: no chest pain, no palpitations GI: no nausea, vomiting, abdominal pain, diarrhea, constipation GU: no dysuria, burning on urination, increased urinary frequency, hematuria  Ext: no leg edema Neuro: no unilateral weakness, numbness, or tingling, no vision change or hearing loss Skin: no rash, no skin tear. MSK: No muscle spasm, no deformity, no limitation of range of movement in spin Heme: No easy bruising.  Travel history: No recent long distant  travel.  Allergy: No Known Allergies  Past Medical History:  Diagnosis Date  . ASCVD (arteriosclerotic cardiovascular disease)   . Cancer [facility]) 2009   Upper lobectomy;radiation; chemotherapy  . Chronic systolic CHF (congestive heart failure) ([place])   . CKD (chronic kidney disease), stage II   . COPD (chronic obstructive pulmonary disease) ([place])   . Coronary artery disease   . Diabetes mellitus   . GERD (gastroesophageal reflux disease)   . Hyperlipidemia   . Hypertension   . Leukocytosis 07/12/2015  . PAF (paroxysmal atrial fibrillation) (HCC)    Chronic warfarin therapy  . Primary cancer of right upper lobe of lung ([place]) 06/30/2013   RUL lobectomy 2009 with chest wall resection for superior sulcus tumor Postoperative radiation and chemotherapy; Dr Earlie Server Oncology monitor [phone]  . Pulmonary embolism (HCC)    PMH of  . Thyroid disease     Past Surgical History:  Procedure Laterality Date  . CAROTID STENT    . CHOLECYSTECTOMY    . FIBEROPTIC BRONCHOSCOPY AND MEIASTINOSCOPY  06/07/2007  . HERNIA REPAIR    . INGUINAL HERNIA REPAIR  10/10/2010  . LEFT HEART CATHETERIZATION WITH CORONARY ANGIOGRAM N/A 08/19/2013   Procedure: LEFT HEART CATHETERIZATION WITH CORONARY ANGIOGRAM;  Surgeon: Jettie Booze, MD;  Location: [facility] CATH LAB;  Service: Cardiovascular;  Laterality: N/A;  . R VATS,THORACOTOMY AND UPPER LOBECTOMY  08/02/2007    Social History:  reports that he quit smoking about 15 years ago. His smoking use included Cigarettes. He has a 50.00 pack-year smoking history. He has quit using smokeless tobacco. He reports that he does not drink alcohol or use drugs.  Family History:  Family History  Problem Relation Age of Onset  . Heart disease Father        Heart attack  . Heart attack Father   . Diabetes Brother   . Cancer Neg Hx   . Stroke Neg Hx   . Hypertension Neg Hx      Prior to Admission medications   Medication Sig Start Date End Date Taking? Authorizing  Provider  albuterol (PROVENTIL HFA;VENTOLIN HFA) 108 (90 Base) MCG/ACT inhaler Inhale 2 puffs into the lungs every 6 (six) hours as needed for wheezing or shortness of breath. 11/16/15  Yes Mikhail, [place], DO  Artificial Tear Ointment (DRY EYES OP) Place 1 drop into both eyes as needed (dry eye).   Yes [provider]  atorvastatin (LIPITOR) 20 MG tablet TAKE 1 TABLET BY MOUTH  DAILY Patient taking differently: TAKE 20 mg TABLET BY MOUTH  DAILY 10/03/16  Yes Jettie Booze, MD  budesonide-formoterol [facility]) 80-4.5 MCG/ACT inhaler Inhale 2 puffs into the lungs every morning. 11/16/15  Yes Mikhail, Velta Addison, DO  dextromethorphan-guaiFENesin (MUCINEX DM) 30-600 MG 12hr tablet Take 1 tablet by mouth 2 (two) times daily as needed for cough. 11/16/15  Yes Mikhail, Velta Addison, DO  diltiazem (CARDIZEM CD) 360 MG 24 hr capsule Take 1 capsule (360 mg total) by mouth daily. 09/15/16 09/15/17 Yes Simmons, Brittainy M, PA-C  fish oil-omega-3 fatty acids 1000 MG capsule Take 1 g by mouth daily.    Yes [provider]  furosemide (LASIX) 20 MG tablet Take 2 tablets (40 mg total) by mouth daily. 09/14/16  Yes Simmons, Brittainy M, PA-C  glipiZIDE (GLUCOTROL) 10 MG tablet Take 10 mg by mouth daily. 08/09/15  Yes [provider]  levothyroxine (SYNTHROID, LEVOTHROID) 112 MCG tablet Take 112 mcg by mouth daily.     Yes [provider]  lisinopril (PRINIVIL,ZESTRIL) 5 MG tablet Take 0.5 tablets (2.5 mg total) by mouth daily. 09/14/16  Yes Lyda Jester M, PA-C  metoprolol tartrate (LOPRESSOR) 25 MG tablet Take 12.5 mg by mouth 2 (two) times daily.    Yes [provider]  nitroGLYCERIN (NITROSTAT) 0.4 MG SL tablet Place 1 tablet (0.4 mg total) under the tongue every 5 (five) minutes as needed for chest pain up to 3 doses 08/03/16  Yes Jettie Booze, MD  omeprazole (PRILOSEC) 20 MG capsule Take 20 mg by mouth every other day.    Yes [provider]  warfarin  (COUMADIN) 5 MG tablet Take 5 mg by mouth See admin instructions. Take 5 mg on Sunday and Thursday Takes 7.5 mg on Mon. Tues. Wed. Fri. And sat.   Yes [provider]  metoprolol tartrate (LOPRESSOR) 25 MG tablet TAKE 1/2 TABLET BY MOUTH TWICE A DAY Patient not taking: Reported on 12/20/2016 10/02/16   Lyda Jester M, PA-C  senna-docusate (SENOKOT-S) 8.6-50 MG tablet Take 1 tablet by mouth at bedtime as needed for mild constipation. Patient not taking: Reported on 12/20/2016 08/21/16   Louellen Molder, MD    Physical Exam: Vitals:   12/20/16 2130 12/20/16 2151 12/20/16 2200 12/20/16 2206  BP: 112/66 112/66 119/78   Pulse: 99 97 (!) 106   Resp: (!) 36 20 (!) 21   Temp:    (!) 101.9 F (38.8 C)  TempSrc:    Rectal  SpO2: 93% 99% 94%    General: Not in acute distress HEENT:       Eyes: PERRL, EOMI, no scleral icterus.       ENT: No discharge from the ears and  nose, no pharynx injection, no tonsillar enlargement.        Neck: No JVD, no bruit, no mass felt. Heme: No neck lymph node enlargement. Cardiac: S1/S2, RRR, No murmurs, No gallops or rubs. Respiratory: Has rhonchi bilaterally. No wheezing or rubs.  GI: Soft, nondistended, nontender, no rebound pain, no organomegaly, BS present. GU: No hematuria Ext: No pitting leg edema bilaterally. 2+DP/PT pulse bilaterally. Musculoskeletal: No joint deformities, No joint redness or warmth, no limitation of ROM in spin. Skin: No rashes.  Neuro: Alert, oriented X3, cranial nerves II-XII grossly intact, moves all extremities normally.  Psych: Patient is not psychotic, no suicidal or hemocidal ideation.  Labs on Admission: I have personally reviewed following labs and imaging studies  CBC:  Recent Labs Lab 12/20/16 1942  WBC 16.0*  NEUTROABS 13.2*  HGB 16.2  HCT 48.1  MCV 92.0  PLT 408   Basic Metabolic Panel:  Recent Labs Lab 12/20/16 1942  NA 139  K 3.9  CL 103  CO2 23  GLUCOSE 191*  BUN 29*  CREATININE 1.42*    CALCIUM 8.6*   GFR: CrCl cannot be calculated (Unknown ideal weight.). Liver Function Tests: No results for input(s): AST, ALT, ALKPHOS, BILITOT, PROT, ALBUMIN in the last 168 hours. No results for input(s): LIPASE, AMYLASE in the last 168 hours. No results for input(s): AMMONIA in the last 168 hours. Coagulation Profile: No results for input(s): INR, PROTIME in the last 168 hours. Cardiac Enzymes: No results for input(s): CKTOTAL, CKMB, CKMBINDEX, TROPONINI in the last 168 hours. BNP (last 3 results) No results for input(s): PROBNP in the last 8760 hours. HbA1C: No results for input(s): HGBA1C in the last 72 hours. CBG: No results for input(s): GLUCAP in the last 168 hours. Lipid Profile: No results for input(s): CHOL, HDL, LDLCALC, TRIG, CHOLHDL, LDLDIRECT in the last 72 hours. Thyroid Function Tests: No results for input(s): TSH, T4TOTAL, FREET4, T3FREE, THYROIDAB in the last 72 hours. Anemia Panel: No results for input(s): VITAMINB12, FOLATE, FERRITIN, TIBC, IRON, RETICCTPCT in the last 72 hours. Urine analysis:    Component Value Date/Time   COLORURINE YELLOW 08/18/2016 [place] 08/18/2016 1509   LABSPEC 1.017 08/18/2016 1509   PHURINE 5.0 08/18/2016 1509   GLUCOSEU NEGATIVE 08/18/2016 1509   HGBUR NEGATIVE 08/18/2016 1509   BILIRUBINUR NEGATIVE 08/18/2016 1509   KETONESUR NEGATIVE 08/18/2016 1509   PROTEINUR 30 (A) 08/18/2016 1509   UROBILINOGEN 4.0 (H) 12/27/2013 2225   NITRITE NEGATIVE 08/18/2016 1509   LEUKOCYTESUR NEGATIVE 08/18/2016 1509   Sepsis Labs: @LABRCNTIP (procalcitonin:4,lacticidven:4) )No results found for this or any previous visit (from the past 240 hour(s)).   Radiological Exams on Admission: Dg Chest 2 View  Result Date: 12/20/2016 CLINICAL DATA:  Productive cough and chest congestion EXAM: CHEST  2 VIEW COMPARISON:  08/19/2016, 06/09/2016, 11/14/2015. FINDINGS: Hyperinflation with right greater than left basilar pleural and  parenchymal scarring. Ill-defined ground-glass density in the left lung base. Stable slightly enlarged cardiomediastinal silhouette. No pneumothorax. IMPRESSION: 1. Hyperinflation with emphysematous disease and chronic right greater than left basilar scarring 2. Increased ground-glass opacity at the left base may reflect superimposed infiltrate. Electronically Signed   By: Donavan Foil M.D.   On: 12/20/2016 20:51     EKG: Independently reviewed. Atrial fibrillation, QTC 486, frequent PVC, fusion beats   Assessment/Plan Principal Problem:   Lobar pneumonia (HCC) Active Problems:   Chronic atrial fibrillation (HCC)   Pulmonary embolism (HCC)   Essential hypertension, benign   Sepsis ([place])  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 (HCC)   CAP (community acquired pneumonia)   Chronic systolic CHF (congestive heart failure) (HCC)   Hypothyroidism   HLD (hyperlipidemia)   Acute on chronic kidney failure-II   Sepsis due to lobar pneumonia [facility]): Patient has cough, SOB, chills, leukocytosis, plus chest x-ray findings of left base groundglass opacity, consistent with lobar pneumonia. Patient admits criteria for sepsis with leukocytosis, tachypnea. Lactic acid is elevated at 2.75. Currently hemodynamically stable, but blood pressure is soft.  - will admit to tele bed as inpt - IV Rocephin and azithromycin - Mucinex for cough  - prn Albuterol for SOB - Urine legionella and S. pneumococcal antigen - Follow up blood culture x2, sputum culture and respiratory virus panel, plus Flu pcr - will get Procalcitonin and trend lactic acid level per sepsis protocol - IVF: 1L of NS bolus in ED, followed by 75 mL per hour of NS (patient has systolic congestive heart failure, limiting aggressive IV fluids treatment)  COPD: -Dulera inhaler, prn albuterol nebs  Atrial Fibrillation:  CHA2DS2-VASc Scoreis 5, needs oral anticoagulation. Patient is on Coumadin at home. Heart rate is 47-97. -continue metoprolol and  Cardizem -Continue Coumadin per pharmacy  Hx of PE: -on Coumadin as above  HTN:  -Continue home metoprolol, Cardizem -hold lasix due to sepsis -hold lisinopril due to soft blood pressure and her worsening renal function -IV hydralazine when necessary  Primary cancer of right upper lobe of lung (HCC):pt has stage II a non-small cell lung cancer, s/p of concurrent chemoradiation followed by surgical resection. He is followed up by Dr. Julien Nordmann -Follow-up with oncologist  DM-II:Last A1c 8.9 on 08/18/16, poorly controled. Patient is taking glipizideat home -SSI  HLD: -Continue home medications: Lipitor  Hypothyroidism: Last TSH was 2.180 on 08/18/16 -Continue home Synthroid  Chronic kidney failure-II:Slightly worse renal function. Baseline creatinine 1.1-1.3, her creatinine is 1.42, BUN 29 -Follow-up renal function by BMP -hold Lasix and lisinopril  Chronic diastolic CHF (congestive heart failure) (HCC):Patient has no leg edema, but no JVD. No pulmonary edema on chest x-ray. BNP 231. CHF seems to be compensated -Continue metoprolol -hold lasix due to sepsis  Protein-calorie malnutrition, severe ([place]): -Nutrition consult   DVT ppx: on coumadin Code Status: Full code Family Communication:   Yes, patient's wife  at bed side Disposition Plan:  Anticipate discharge back to previous home environment Consults called:  none Admission status: Inpatient/tele    Date of Service 12/20/2016    Ivor Costa [facility] Pager [phone]  If 7PM-7AM, please contact night-coverage [email or web address] Password [email or web address] 12/20/2016, 10:23 PM

## 2016-12-20 NOTE — ED Provider Notes (Signed)
[facility] DEPT Provider Note   CSN: [id number] Arrival date & time: 12/20/16  1910     History   Chief Complaint Chief Complaint  Patient presents with  . Cough    Chest Congestion   . Chills    HPI Justin Garner is a 83 y.o. male.  HPI  84 year old male with a history of COPD, CHF, diabetes, and paroxysmal atrial fibrillation on warfarin presents with cough. He states the cough started about a week ago although his wife states it really started yesterday. Appears to always have some degree of cough but it is worsening. No fever but has had some chills. Some sore throat. No significant headache. No leg swelling. Had trouble walking to his car because he got weak and short of breath.  Past Medical History:  Diagnosis Date  . ASCVD (arteriosclerotic cardiovascular disease)   . Cancer [facility]) 2009   Upper lobectomy;radiation; chemotherapy  . Chronic systolic CHF (congestive heart failure) ([place])   . CKD (chronic kidney disease), stage II   . COPD (chronic obstructive pulmonary disease) ([place])   . Coronary artery disease   . Diabetes mellitus   . GERD (gastroesophageal reflux disease)   . Hyperlipidemia   . Hypertension   . Leukocytosis 07/12/2015  . PAF (paroxysmal atrial fibrillation) (HCC)    Chronic warfarin therapy  . Primary cancer of right upper lobe of lung ([place]) 06/30/2013   RUL lobectomy 2009 with chest wall resection for superior sulcus tumor Postoperative radiation and chemotherapy; Dr Earlie Server Oncology monitor [phone]  . Pulmonary embolism (HCC)    PMH of  . Thyroid disease     Patient Active Problem List   Diagnosis Date Noted  . Lobar pneumonia ([facility]) 12/20/2016  . Atrial fibrillation with RVR ([place]) 06/09/2016  . Acute encephalopathy 06/09/2016  . COPD exacerbation ([place]) 11/15/2015  . Acute on chronic kidney failure-II 11/15/2015  . Malnutrition of moderate degree 11/15/2015  . Leukocytosis 07/12/2015  . Chronic obstructive pulmonary disease ([place])    . Cardiomyopathy ([place])   . CAD in native artery   . Hypomagnesemia   . CAP (community acquired pneumonia) 06/13/2015  . Acute on chronic respiratory failure with hypoxia ([place]) 06/13/2015  . Hypothyroidism 06/13/2015  . HLD (hyperlipidemia) 06/13/2015  . Chronic systolic CHF (congestive heart failure) ([place])   . Protein-calorie malnutrition, severe ([place]) 12/29/2013  . Hypokalemia 12/28/2013  . Sepsis ([place]) 12/28/2013  . 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 ([place]) 12/28/2013  . Acute systolic heart failure ([place]) 08/12/2013  . Primary cancer of right upper lobe of lung ([place]) 06/30/2013  . Coronary atherosclerosis of native coronary artery 05/28/2013  . Essential hypertension, benign 05/28/2013  . Chronic atrial fibrillation ([place]) 12/24/2012  . Pulmonary embolism ([place]) 12/24/2012  . COPD with acute bronchitis ([place]) 04/23/2008    Past Surgical History:  Procedure Laterality Date  . CAROTID STENT    . CHOLECYSTECTOMY    . FIBEROPTIC BRONCHOSCOPY AND MEIASTINOSCOPY  06/07/2007  . HERNIA REPAIR    . INGUINAL HERNIA REPAIR  10/10/2010  . LEFT HEART CATHETERIZATION WITH CORONARY ANGIOGRAM N/A 08/19/2013   Procedure: LEFT HEART CATHETERIZATION WITH CORONARY ANGIOGRAM;  Surgeon: Jettie Booze, MD;  Location: [facility] CATH LAB;  Service: Cardiovascular;  Laterality: N/A;  . R VATS,THORACOTOMY AND UPPER LOBECTOMY  08/02/2007       Home Medications    Prior to Admission medications   Medication Sig Start Date End Date Taking? Authorizing Provider  albuterol (PROVENTIL HFA;VENTOLIN HFA) 108 (90 Base)  MCG/ACT inhaler Inhale 2 puffs into the lungs every 6 (six) hours as needed for wheezing or shortness of breath. 11/16/15  Yes Mikhail, [place], DO  Artificial Tear Ointment (DRY EYES OP) Place 1 drop into both eyes as needed (dry eye).   Yes [provider]  atorvastatin (LIPITOR) 20 MG tablet TAKE 1 TABLET BY MOUTH  DAILY Patient taking differently: TAKE 20 mg TABLET BY MOUTH  DAILY 10/03/16   Yes Jettie Booze, MD  budesonide-formoterol [facility]) 80-4.5 MCG/ACT inhaler Inhale 2 puffs into the lungs every morning. 11/16/15  Yes Mikhail, Velta Addison, DO  dextromethorphan-guaiFENesin (MUCINEX DM) 30-600 MG 12hr tablet Take 1 tablet by mouth 2 (two) times daily as needed for cough. 11/16/15  Yes Mikhail, Velta Addison, DO  diltiazem (CARDIZEM CD) 360 MG 24 hr capsule Take 1 capsule (360 mg total) by mouth daily. 09/15/16 09/15/17 Yes Simmons, Brittainy M, PA-C  fish oil-omega-3 fatty acids 1000 MG capsule Take 1 g by mouth daily.    Yes [provider]  furosemide (LASIX) 20 MG tablet Take 2 tablets (40 mg total) by mouth daily. 09/14/16  Yes Simmons, Brittainy M, PA-C  glipiZIDE (GLUCOTROL) 10 MG tablet Take 10 mg by mouth daily. 08/09/15  Yes [provider]  levothyroxine (SYNTHROID, LEVOTHROID) 112 MCG tablet Take 112 mcg by mouth daily.     Yes [provider]  lisinopril (PRINIVIL,ZESTRIL) 5 MG tablet Take 0.5 tablets (2.5 mg total) by mouth daily. 09/14/16  Yes Lyda Jester M, PA-C  metoprolol tartrate (LOPRESSOR) 25 MG tablet Take 12.5 mg by mouth 2 (two) times daily.    Yes [provider]  nitroGLYCERIN (NITROSTAT) 0.4 MG SL tablet Place 1 tablet (0.4 mg total) under the tongue every 5 (five) minutes as needed for chest pain up to 3 doses 08/03/16  Yes Jettie Booze, MD  omeprazole (PRILOSEC) 20 MG capsule Take 20 mg by mouth every other day.    Yes [provider]  warfarin (COUMADIN) 5 MG tablet Take 5 mg by mouth See admin instructions. Take 5 mg on Sunday and Thursday Takes 7.5 mg on Mon. Tues. Wed. Fri. And sat.   Yes [provider]  metoprolol tartrate (LOPRESSOR) 25 MG tablet TAKE 1/2 TABLET BY MOUTH TWICE A DAY Patient not taking: Reported on 12/20/2016 10/02/16   Lyda Jester M, PA-C  senna-docusate (SENOKOT-S) 8.6-50 MG tablet Take 1 tablet by mouth at bedtime as needed for mild constipation. Patient not  taking: Reported on 12/20/2016 08/21/16   Louellen Molder, MD    Family History Family History  Problem Relation Age of Onset  . Heart disease Father        Heart attack  . Heart attack Father   . Diabetes Brother   . Cancer Neg Hx   . Stroke Neg Hx   . Hypertension Neg Hx     Social History Social History  Substance Use Topics  . Smoking status: Former Smoker    Packs/day: 1.00    Years: 50.00    Types: Cigarettes    Quit date: 03/20/2001  . Smokeless tobacco: Former Systems developer  . Alcohol use No     Allergies   Patient has no known allergies.   Review of Systems Review of Systems  Constitutional: Positive for chills. Negative for fever.  HENT: Positive for sore throat.   Respiratory: Positive for cough and shortness of breath.   Cardiovascular: Negative for leg swelling.  Gastrointestinal: Negative for abdominal pain and vomiting.  All other  systems reviewed and are negative.    Physical Exam Updated Vital Signs BP 109/76   Pulse (!) 49   Temp 99.1 F (37.3 C) (Oral)   Resp (!) 31   SpO2 93%   Physical Exam  Constitutional: He is oriented to person, place, and time. He appears well-developed and well-nourished.  HENT:  Head: Normocephalic and atraumatic.  Right Ear: External ear normal.  Left Ear: External ear normal.  Nose: Nose normal.  Eyes: Right eye exhibits no discharge. Left eye exhibits no discharge.  Neck: Neck supple.  Cardiovascular: Normal rate and normal heart sounds.  An irregular rhythm present.  Pulmonary/Chest: Effort normal. No accessory muscle usage. Tachypnea noted. No respiratory distress. He has no wheezes. He has rales in the left lower field.  Abdominal: Soft. There is no tenderness.  Musculoskeletal: He exhibits no edema.  Neurological: He is alert and oriented to person, place, and time.  Skin: Skin is warm and dry.  Nursing note and vitals reviewed.    ED Treatments / Results  Labs (all labs ordered are listed, but only  abnormal results are displayed) Labs Reviewed  BRAIN NATRIURETIC PEPTIDE - Abnormal; Notable for the following:       Result Value   B Natriuretic Peptide 231.1 (*)    All other components within normal limits  CBC WITH DIFFERENTIAL/PLATELET - Abnormal; Notable for the following:    WBC 16.0 (*)    Neutro Abs 13.2 (*)    Monocytes Absolute 1.2 (*)    All other components within normal limits  BASIC METABOLIC PANEL - Abnormal; Notable for the following:    Glucose, Bld 191 (*)    BUN 29 (*)    Creatinine, Ser 1.42 (*)    Calcium 8.6 (*)    GFR calc non Af Amer 44 (*)    GFR calc Af Amer 52 (*)    All other components within normal limits  PROTIME-INR - Abnormal; Notable for the following:    Prothrombin Time 24.9 (*)    All other components within normal limits  I-STAT CG4 LACTIC ACID, ED - Abnormal; Notable for the following:    Lactic Acid, Venous 2.75 (*)    All other components within normal limits  CULTURE, BLOOD (ROUTINE X 2)  CULTURE, BLOOD (ROUTINE X 2)  RESPIRATORY PANEL BY PCR  CULTURE, EXPECTORATED SPUTUM-ASSESSMENT  GRAM STAIN  INFLUENZA PANEL BY PCR (TYPE A & B)  PROCALCITONIN  HIV ANTIBODY (ROUTINE TESTING)  STREP PNEUMONIAE URINARY ANTIGEN  LEGIONELLA PNEUMOPHILA SEROGP 1 UR AG  PROTIME-INR  I-STAT TROPONIN, ED  I-STAT CG4 LACTIC ACID, ED    EKG  EKG Interpretation  Date/Time:  Wednesday December 20 2016 19:28:57 EDT Ventricular Rate:  87 PR Interval:    QRS Duration: 88 QT Interval:  404 QTC Calculation: 486 R Axis:   -150 Text Interpretation:  Atrial fibrillation with premature ventricular or aberrantly conducted complexes Right superior axis deviation Septal infarct , age undetermined Abnormal ECG more PVCs, otherwise similar to June 2018 Confirmed by Sherwood Gambler [phone]) on 12/20/2016 8:43:43 PM       Radiology Dg Chest 2 View  Result Date: 12/20/2016 CLINICAL DATA:  Productive cough and chest congestion EXAM: CHEST  2 VIEW COMPARISON:   08/19/2016, 06/09/2016, 11/14/2015. FINDINGS: Hyperinflation with right greater than left basilar pleural and parenchymal scarring. Ill-defined ground-glass density in the left lung base. Stable slightly enlarged cardiomediastinal silhouette. No pneumothorax. IMPRESSION: 1. Hyperinflation with emphysematous disease and chronic right greater than left  basilar scarring 2. Increased ground-glass opacity at the left base may reflect superimposed infiltrate. Electronically Signed   By: Donavan Foil M.D.   On: 12/20/2016 20:51    Procedures Procedures (including critical care time)  Medications Ordered in ED Medications  azithromycin (ZITHROMAX) 500 mg in dextrose 5 % 250 mL IVPB (500 mg Intravenous New Bag/Given 12/20/16 2226)  azithromycin (ZITHROMAX) 500 mg in dextrose 5 % 250 mL IVPB (not administered)  cefTRIAXone (ROCEPHIN) 1 g in dextrose 5 % 50 mL IVPB (not administered)  atorvastatin (LIPITOR) tablet 20 mg (not administered)  diltiazem (CARDIZEM CD) 24 hr capsule 360 mg (not administered)  metoprolol tartrate (LOPRESSOR) tablet 12.5 mg (not administered)  nitroGLYCERIN (NITROSTAT) SL tablet 0.4 mg (not administered)  mometasone-formoterol (DULERA) 100-5 MCG/ACT inhaler 2 puff (not administered)  dextromethorphan-guaiFENesin (MUCINEX DM) 30-600 MG per 12 hr tablet 1 tablet (not administered)  fish oil-omega-3 fatty acids capsule 1 g (not administered)  levothyroxine (SYNTHROID, LEVOTHROID) tablet 112 mcg (not administered)  pantoprazole (PROTONIX) EC tablet 40 mg (not administered)  sodium chloride 0.9 % bolus 1,000 mL (1,000 mLs Intravenous New Bag/Given 12/20/16 2306)  0.9 %  sodium chloride infusion (not administered)  albuterol (PROVENTIL) (2.5 MG/3ML) 0.083% nebulizer solution 2.5 mg (not administered)  acetaminophen (TYLENOL) tablet 650 mg (not administered)  hydrALAZINE (APRESOLINE) injection 5 mg (not administered)  ondansetron (ZOFRAN) injection 4 mg (not administered)  zolpidem  (AMBIEN) tablet 5 mg (not administered)  cefTRIAXone (ROCEPHIN) 1 g in dextrose 5 % 50 mL IVPB (0 g Intravenous Stopped 12/20/16 2308)     Initial Impression / Assessment and Plan / ED Course  I have reviewed the triage vital signs and the nursing notes.  Pertinent labs & imaging results that were available during my care of the patient were reviewed by me and considered in my medical decision making (see chart for details).     Patient's presentation/workup is consistent with pneumonia. Given his increased work of breathing but no distress, he will need admission for supportive care and antibiotics. I will also add on a flu swab although he is not having classic flu symptoms. Admit to the hospitalist.  Final Clinical Impressions(s) / ED Diagnoses   Final diagnoses:  Community acquired pneumonia of left lower lobe of lung [facility])    New Prescriptions Current Discharge Medication List       Sherwood Gambler, MD 12/20/16 2327

## 2016-12-20 NOTE — Progress Notes (Signed)
Pharmacy Antibiotic Note  Justin Garner is a 83 y.o. male admitted on 12/20/2016 with community acquired pneumonia.  Pharmacy has been consulted for Ceftriaxone and Azithromycin dosing. WBC 16.0, LA 2.75, afebrile.  CXR: opacity indicating superimposed infiltrate.  Plan: Ceftriaxone 1g IV X1, then Ceftriaxone 1g IV every 24 hours. Azithromycin 500mg  IV X1, then 500mg  IV daily. Ceftriaxone and Azithromycin are not renally dosed, so pharmacy will sign off on consult.  Monitor for c/s, clinical resolution.    Temp (24hrs), Avg:98.2 F (36.8 C), Min:98.2 F (36.8 C), Max:98.2 F (36.8 C)   Recent Labs Lab 12/20/16 1942 12/20/16 2000  WBC 16.0*  --   CREATININE 1.42*  --   LATICACIDVEN  --  2.75*    CrCl cannot be calculated (Unknown ideal weight.).    No Known Allergies  Antimicrobials this admission: Ceftriaxone 10/3 >>  Azithromycin 10/3 >>    Thank you for allowing pharmacy to be a part of this patient's care.  Jerrye Noble, PharmD Candidate 12/20/2016 9:54 PM

## 2016-12-20 NOTE — Progress Notes (Signed)
[place] for warfarin Indication: hx of  atrial fibrillation, PE   Assessment: 89 yom presenting with cough/SOB on warfarin PTA for afib and PE history. Pharmacy consulted to dose warfarin inpatient. INR 2.28 on admit, CBC wnl. No bleed documented. Noted started on ceftriaxone/azithromycin for CAP - may affect INR.  PTA warfarin dose: 7.5mg  daily except 5mg  on Sun/Thurs (last dose 10/3 PTA)  Goal of Therapy:  INR 2-3 Monitor platelets by anticoagulation protocol: Yes   Plan:  No further warfarin tonight - already took dose Daily INR Monitor CBC, s/sx bleeding, DDI  Elicia Lamp, PharmD, BCPS Clinical Pharmacist 12/20/2016 10:24 PM

## 2016-12-21 DIAGNOSIS — N179 Acute kidney failure, unspecified: Secondary | ICD-10-CM

## 2016-12-21 LAB — BASIC METABOLIC PANEL
ANION GAP: 11 (ref 5–15)
BUN: 24 mg/dL — ABNORMAL HIGH (ref 6–20)
CO2: 22 mmol/L (ref 22–32)
Calcium: 8.1 mg/dL — ABNORMAL LOW (ref 8.9–10.3)
Chloride: 105 mmol/L (ref 101–111)
Creatinine, Ser: 1.33 mg/dL — ABNORMAL HIGH (ref 0.61–1.24)
GFR calc Af Amer: 56 mL/min — ABNORMAL LOW (ref >60)
GFR, EST NON AFRICAN AMERICAN: 48 mL/min — AB (ref >60)
Glucose, Bld: 140 mg/dL — ABNORMAL HIGH (ref 65–99)
POTASSIUM: 3.4 mmol/L — AB (ref 3.5–5.1)
SODIUM: 138 mmol/L (ref 135–145)

## 2016-12-21 LAB — RESPIRATORY PANEL BY PCR
Adenovirus: NOT DETECTED
BORDETELLA PERTUSSIS-RVPCR: NOT DETECTED
CORONAVIRUS HKU1-RVPPCR: NOT DETECTED
CORONAVIRUS OC43-RVPPCR: NOT DETECTED
Chlamydophila pneumoniae: NOT DETECTED
Coronavirus 229E: NOT DETECTED
Coronavirus NL63: NOT DETECTED
INFLUENZA A H3-RVPPCR: NOT DETECTED
Influenza A H1 2009: NOT DETECTED
Influenza A H1: NOT DETECTED
Influenza A: NOT DETECTED
Influenza B: NOT DETECTED
METAPNEUMOVIRUS-RVPPCR: NOT DETECTED
Mycoplasma pneumoniae: NOT DETECTED
PARAINFLUENZA VIRUS 1-RVPPCR: NOT DETECTED
PARAINFLUENZA VIRUS 2-RVPPCR: NOT DETECTED
PARAINFLUENZA VIRUS 3-RVPPCR: NOT DETECTED
Parainfluenza Virus 4: NOT DETECTED
RHINOVIRUS / ENTEROVIRUS - RVPPCR: NOT DETECTED
Respiratory Syncytial Virus: NOT DETECTED

## 2016-12-21 LAB — EXPECTORATED SPUTUM ASSESSMENT W GRAM STAIN, RFLX TO RESP C

## 2016-12-21 LAB — HIV ANTIBODY (ROUTINE TESTING W REFLEX): HIV Screen 4th Generation wRfx: NONREACTIVE

## 2016-12-21 LAB — GLUCOSE, CAPILLARY
GLUCOSE-CAPILLARY: 112 mg/dL — AB (ref 65–99)
Glucose-Capillary: 160 mg/dL — ABNORMAL HIGH (ref 65–99)
Glucose-Capillary: 85 mg/dL (ref 65–99)

## 2016-12-21 LAB — EXPECTORATED SPUTUM ASSESSMENT W REFEX TO RESP CULTURE

## 2016-12-21 LAB — INFLUENZA PANEL BY PCR (TYPE A & B)
INFLBPCR: NEGATIVE
Influenza A By PCR: NEGATIVE

## 2016-12-21 LAB — PROTIME-INR
INR: 2.46
PROTHROMBIN TIME: 26.5 s — AB (ref 11.4–15.2)

## 2016-12-21 LAB — LACTIC ACID, PLASMA
Lactic Acid, Venous: 1.8 mmol/L (ref 0.5–1.9)
Lactic Acid, Venous: 1.8 mmol/L (ref 0.5–1.9)

## 2016-12-21 LAB — STREP PNEUMONIAE URINARY ANTIGEN: Strep Pneumo Urinary Antigen: NEGATIVE

## 2016-12-21 LAB — PROCALCITONIN: PROCALCITONIN: 1.42 ng/mL

## 2016-12-21 MED ORDER — INSULIN ASPART 100 UNIT/ML ~~LOC~~ SOLN
0.0000 [IU] | Freq: Three times a day (TID) | SUBCUTANEOUS | Status: DC
  Administered 2016-12-21 – 2016-12-22 (×2): 2 [IU] via SUBCUTANEOUS
  Administered 2016-12-23: 1 [IU] via SUBCUTANEOUS

## 2016-12-21 MED ORDER — GLUCERNA SHAKE PO LIQD
237.0000 mL | Freq: Three times a day (TID) | ORAL | Status: DC
  Administered 2016-12-22: 237 mL via ORAL

## 2016-12-21 MED ORDER — WARFARIN - PHARMACIST DOSING INPATIENT
Freq: Every day | Status: DC
  Administered 2016-12-21 – 2016-12-22 (×2)

## 2016-12-21 MED ORDER — WARFARIN SODIUM 5 MG PO TABS
5.0000 mg | ORAL_TABLET | ORAL | Status: DC
  Administered 2016-12-21: 5 mg via ORAL
  Filled 2016-12-21: qty 1

## 2016-12-21 MED ORDER — WARFARIN SODIUM 7.5 MG PO TABS
7.5000 mg | ORAL_TABLET | ORAL | Status: DC
  Administered 2016-12-22: 7.5 mg via ORAL
  Filled 2016-12-21 (×2): qty 1

## 2016-12-21 NOTE — Progress Notes (Signed)
Visited with patient who is being monitored for heart rhythm (thats what he told me) Had brief visit. He asked about my denomination [employer]) and offered a prayer for him to bless him and his care givers and the room where he was recieving care. Conard Novak, Chaplain    12/21/16 1100  Clinical Encounter Type  Visited With Patient  Visit Type Initial  Referral From Nurse  Spiritual Encounters  Spiritual Needs Prayer  Stress Factors  Patient Stress Factors None identified  Family Stress Factors None identified

## 2016-12-21 NOTE — Evaluation (Signed)
Physical Therapy Evaluation Patient Details Name: Justin Garner MRN: [id number] DOB: 1932/11/05 Today's Date: 12/21/2016   History of Present Illness  Justin Garner is a 83 y.o. male with medical history significant of hypertension, hyperlipidemia, diabetes mellitus, COPD, GERD, hypothyroidism, PAF and PE on Coumadin, sCHF, CKD-II, lung cancer (s/p of lobectomy, radiation and chemotherapy), who presents with SOB, cough and chills, chest congestion.    Clinical Impression  Patient presents with decreased mobility due to weakness from illness and decreased balance.  Currently requires min A for mobility and was independent with cane or no device PTA.  Feel he will need skilled PT in the acute setting prior to d/c home with wife assist and follow up HHPT. Encouraged frequent ambulation during hospital stay.     Follow Up Recommendations Home health PT    Equipment Recommendations  None recommended by PT    Recommendations for Other Services       Precautions / Restrictions Precautions Precautions: Fall      Mobility  Bed Mobility Overal bed mobility: Modified Independent                Transfers Overall transfer level: Needs assistance Equipment used: Straight cane Transfers: Sit to/from Stand Sit to Stand: Min assist;Min guard         General transfer comment: from EOB minguard for safety, from low toilet min A with grabbar  Ambulation/Gait Ambulation/Gait assistance: Supervision;Min guard Ambulation Distance (Feet): 100 Feet Assistive device: Straight cane Gait Pattern/deviations: Step-through pattern;Decreased stride length;Trunk flexed     General Gait Details: slow pace, but relatively steady with cane, assist for safety  Stairs            Wheelchair Mobility    Modified Rankin (Stroke Patients Only)       Balance Overall balance assessment: Needs assistance   Sitting balance-Leahy Scale: Good     Standing balance support: No upper  extremity supported Standing balance-Leahy Scale: Fair Standing balance comment: standing to wash hands in sink after toileting in bathroom                             Pertinent Vitals/Pain Pain Assessment: No/denies pain    Home Living Family/patient expects to be discharged to:: Private residence Living Arrangements: Spouse/significant other Available Help at Discharge: Family;Available 24 hours/day Type of Home: Mobile home Home Access: Stairs to enter Entrance Stairs-Rails: Right;Left;Can reach both Entrance Stairs-Number of Steps: 3 Home Layout: One level Home Equipment: Walker - 2 wheels;Cane - single point      Prior Function Level of Independence: Independent with assistive device(s)         Comments: Patient uses cane for ambulation.  Independent in ADL's     Hand Dominance   Dominant Hand: Right    Extremity/Trunk Assessment   Upper Extremity Assessment Upper Extremity Assessment: Overall WFL for tasks assessed    Lower Extremity Assessment Lower Extremity Assessment: Overall WFL for tasks assessed    Cervical / Trunk Assessment Cervical / Trunk Assessment: Kyphotic;Other exceptions Cervical / Trunk Exceptions: severe kyphosis with forward head   Communication   Communication: HOH  Cognition Arousal/Alertness: Awake/alert Behavior During Therapy: WFL for tasks assessed/performed Overall Cognitive Status: Within Functional Limits for tasks assessed  General Comments General comments (skin integrity, edema, etc.): able to cleanse self after toileting about 75%, helped with the rest    Exercises     Assessment/Plan    PT Assessment Patient needs continued PT services  PT Problem List Decreased strength;Decreased activity tolerance;Decreased mobility;Decreased safety awareness;Decreased balance       PT Treatment Interventions DME instruction;Gait training;Balance training;Stair  training;Functional mobility training;Therapeutic exercise;Patient/family education;Therapeutic activities    PT Goals (Current goals can be found in the Care Plan section)  Acute Rehab PT Goals Patient Stated Goal: To go home PT Goal Formulation: With patient/family Time For Goal Achievement: 12/28/16 Potential to Achieve Goals: Good    Frequency Min 3X/week   Barriers to discharge        Co-evaluation               AM-PAC PT "6 Clicks" Daily Activity  Outcome Measure Difficulty turning over in bed (including adjusting bedclothes, sheets and blankets)?: None Difficulty moving from lying on back to sitting on the side of the bed? : None Difficulty sitting down on and standing up from a chair with arms (e.g., wheelchair, bedside commode, etc,.)?: A Little Help needed moving to and from a bed to chair (including a wheelchair)?: A Little Help needed walking in hospital room?: A Little Help needed climbing 3-5 steps with a railing? : A Little 6 Click Score: 20    End of Session Equipment Utilized During Treatment: Gait belt Activity Tolerance: Patient tolerated treatment well Patient left: in bed;with call bell/phone within reach;with family/visitor present   PT Visit Diagnosis: Other abnormalities of gait and mobility (R26.89);Muscle weakness (generalized) (M62.81)    Time: 1410-1434 PT Time Calculation (min) (ACUTE ONLY): 24 min   Charges:   PT Evaluation $PT Eval Moderate Complexity: 1 Mod PT Treatments $Gait Training: 8-22 mins   PT G CodesMagda Kiel, [place] [phone] 12/21/2016   Reginia Naas 12/21/2016, 2:44 PM

## 2016-12-21 NOTE — Progress Notes (Signed)
PROGRESS NOTE  Justin Garner ZSW:[id number] DOB: 1932/06/21 DOA: 12/20/2016 PCP: [name], MD   LOS: 1 day   Brief Narrative / Interim history: Justin Garner is a 83 y.o. male with medical history significant of hypertension, hyperlipidemia, diabetes mellitus, COPD, GERD, hypothyroidism, PAF and PE on Coumadin, sCHF, CKD-II, lung cancer (s/p of lobectomy, radiation and chemotherapy), who presents with SOB, cough and chills, chest congestion.  He tells me he has been having cough, shortness of breath and chest congestion since couple of days prior to coming to the ED.    Assessment & Plan: Principal Problem:   Lobar pneumonia ([place]) Active Problems:   Chronic atrial fibrillation ([place])   Pulmonary embolism ([facility])   Essential hypertension, benign   Sepsis ([place])   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 ([place])   CAP (community acquired pneumonia)   Chronic systolic CHF (congestive heart failure) (H[facility])   Hypothyroidism   HLD (hyperlipidemia)   Acute on chronic kidney failure-II   Sepsis due to community-acquired pneumonia, left lower lobe pneumonia -Patient met criteria for sepsis with tachypnea, tachycardia as well as leukocytosis and apparent source of the chest x-ray -Started on antibiotics with ceftriaxone and azithromycin in the ED, continue -Respiratory panel negative -Strep pneumo negative, Legionella pending  COPD -No wheezing, continue Dulera, continue as needed nebulizers  A. fib -Continue Coumadin, rate controlled with metoprolol and Cardizem  History of PE -Continue Coumadin  Hypertension -Continue metoprolol, Cardizem, hold Lasix and lisinopril due to soft blood pressure and worsening renal function  Primary cancer of right upper lobe of lung -Status post resection, followed by Dr. Julien Nordmann as an outpatient  Type 2 diabetes mellitus -Continue sliding scale  Hyperlipidemia -Continue Lipitor  Hypothyroidism -Continue Synthroid  Chronic kidney disease stage  II -Creatinine at baseline today  Chronic diastolic CHF -Appears compensated, hold Lasix as above  Severe protein calorie malnutrition   DVT prophylaxis: Coumadin Code Status: Full code Family Communication: No family at bedside Disposition Plan: Discharge home 2-3 days  Consultants:   None  Procedures:   None   Antimicrobials:  Ceftriaxone 10/3 >>  Azithromycin 10/3 >>  Subjective: - no chest pain, shortness of breath, no abdominal pain, nausea or vomiting.  Feeling better than yesterday  Objective: Vitals:   12/20/16 2338 12/21/16 0446 12/21/16 0929 12/21/16 1013  BP: 113/75 111/74  105/72  Pulse: 74 99  80  Resp: (!) 22 20    Temp: 98 F (36.7 C) 98 F (36.7 C)    TempSrc: Oral Oral    SpO2: 93% 94% 95%   Weight: 87.2 kg (192 lb 3.9 Justin)     Height: '6\' 2"'$  (1.88 m)       Intake/Output Summary (Last 24 hours) at 12/21/16 1401 Last data filed at 12/21/16 1005  Gross per 24 hour  Intake           556.25 ml  Output              475 ml  Net            81.25 ml   Filed Weights   12/20/16 2338  Weight: 87.2 kg (192 lb 3.9 Justin)    Examination:  Constitutional: NAD, appears somewhat flushed Eyes: lids and conjunctivae normal ENMT: Mucous membranes are moist.  Respiratory: clear to auscultation bilaterally, no wheezing, no crackles. Normal respiratory effort. No accessory muscle use.  Overall decreased breath sounds Cardiovascular: Regular rate and rhythm, no murmurs / rubs / gallops.  No LE edema. Abdomen: no tenderness. Bowel sounds positive.  Skin: no rashes, lesions, ulcers. No induration Neurologic: CN 2-12 grossly intact. Strength 5/5 in all 4.    Data Reviewed: I have independently reviewed following labs and imaging studies   CBC:  Recent Labs Lab 12/20/16 1942  WBC 16.0*  NEUTROABS 13.2*  HGB 16.2  HCT 48.1  MCV 92.0  PLT 948   Basic Metabolic Panel:  Recent Labs Lab 12/20/16 1942 12/21/16 0959  NA 139 138  K 3.9 3.4*  CL 103  105  CO2 23 22  GLUCOSE 191* 140*  BUN 29* 24*  CREATININE 1.42* 1.33*  CALCIUM 8.6* 8.1*   GFR: Estimated Creatinine Clearance: 49.8 mL/min (A) (by C-G formula based on SCr of 1.33 mg/dL (H)). Liver Function Tests: No results for input(s): AST, ALT, ALKPHOS, BILITOT, PROT, ALBUMIN in the last 168 hours. No results for input(s): LIPASE, AMYLASE in the last 168 hours. No results for input(s): AMMONIA in the last 168 hours. Coagulation Profile:  Recent Labs Lab 12/20/16 2153 12/21/16 0003  INR 2.28 2.46   Cardiac Enzymes: No results for input(s): CKTOTAL, CKMB, CKMBINDEX, TROPONINI in the last 168 hours. BNP (last 3 results) No results for input(s): PROBNP in the last 8760 hours. HbA1C: No results for input(s): HGBA1C in the last 72 hours. CBG:  Recent Labs Lab 12/21/16 1257  GLUCAP 112*   Lipid Profile: No results for input(s): CHOL, HDL, LDLCALC, TRIG, CHOLHDL, LDLDIRECT in the last 72 hours. Thyroid Function Tests: No results for input(s): TSH, T4TOTAL, FREET4, T3FREE, THYROIDAB in the last 72 hours. Anemia Panel: No results for input(s): VITAMINB12, FOLATE, FERRITIN, TIBC, IRON, RETICCTPCT in the last 72 hours. Urine analysis:    Component Value Date/Time   COLORURINE YELLOW 08/18/2016 [place] 08/18/2016 1509   LABSPEC 1.017 08/18/2016 1509   PHURINE 5.0 08/18/2016 1509   GLUCOSEU NEGATIVE 08/18/2016 1509   HGBUR NEGATIVE 08/18/2016 1509   BILIRUBINUR NEGATIVE 08/18/2016 1509   KETONESUR NEGATIVE 08/18/2016 1509   PROTEINUR 30 (A) 08/18/2016 1509   UROBILINOGEN 4.0 (H) 12/27/2013 2225   NITRITE NEGATIVE 08/18/2016 1509   LEUKOCYTESUR NEGATIVE 08/18/2016 1509   Sepsis Labs: Invalid input(s): PROCALCITONIN, LACTICIDVEN  Recent Results (from the past 240 hour(s))  Blood Culture (routine x 2)     Status: None (Preliminary result)   Collection Time: 12/21/16 12:00 AM  Result Value Ref Range Status   Specimen Description BLOOD BLOOD RIGHT  FOREARM  Final   Special Requests IN PEDIATRIC BOTTLE Blood Culture adequate volume  Final   Culture PENDING  Incomplete   Report Status PENDING  Incomplete  Blood Culture (routine x 2)     Status: None (Preliminary result)   Collection Time: 12/21/16 12:05 AM  Result Value Ref Range Status   Specimen Description BLOOD BLOOD LEFT FOREARM  Final   Special Requests   Final    BOTTLES DRAWN AEROBIC AND ANAEROBIC Blood Culture adequate volume   Culture PENDING  Incomplete   Report Status PENDING  Incomplete  Culture, sputum-assessment     Status: None   Collection Time: 12/21/16 12:13 AM  Result Value Ref Range Status   Specimen Description EXPECTORATED SPUTUM  Final   Special Requests NONE  Final   Sputum evaluation THIS SPECIMEN IS ACCEPTABLE FOR SPUTUM CULTURE  Final   Report Status 12/21/2016 FINAL  Final  Culture, respiratory (NON-Expectorated)     Status: None (Preliminary result)   Collection Time: 12/21/16 12:13 AM  Result Value Ref Range Status   Specimen Description EXPECTORATED SPUTUM  Final   Special Requests NONE Reflexed from [id number]  Final   Gram Stain   Final    ABUNDANT WBC PRESENT, PREDOMINANTLY PMN ABUNDANT GRAM POSITIVE COCCI MODERATE GRAM POSITIVE RODS FEW GRAM NEGATIVE RODS    Culture PENDING  Incomplete   Report Status PENDING  Incomplete  Respiratory Panel by PCR     Status: None   Collection Time: 12/21/16  1:46 AM  Result Value Ref Range Status   Adenovirus NOT DETECTED NOT DETECTED Final   Coronavirus 229E NOT DETECTED NOT DETECTED Final   Coronavirus HKU1 NOT DETECTED NOT DETECTED Final   Coronavirus NL63 NOT DETECTED NOT DETECTED Final   Coronavirus OC43 NOT DETECTED NOT DETECTED Final   Metapneumovirus NOT DETECTED NOT DETECTED Final   Rhinovirus / Enterovirus NOT DETECTED NOT DETECTED Final   Influenza A NOT DETECTED NOT DETECTED Final   Influenza A H1 NOT DETECTED NOT DETECTED Final   Influenza A H1 2009 NOT DETECTED NOT DETECTED Final    Influenza A H3 NOT DETECTED NOT DETECTED Final   Influenza B NOT DETECTED NOT DETECTED Final   Parainfluenza Virus 1 NOT DETECTED NOT DETECTED Final   Parainfluenza Virus 2 NOT DETECTED NOT DETECTED Final   Parainfluenza Virus 3 NOT DETECTED NOT DETECTED Final   Parainfluenza Virus 4 NOT DETECTED NOT DETECTED Final   Respiratory Syncytial Virus NOT DETECTED NOT DETECTED Final   Bordetella pertussis NOT DETECTED NOT DETECTED Final   Chlamydophila pneumoniae NOT DETECTED NOT DETECTED Final   Mycoplasma pneumoniae NOT DETECTED NOT DETECTED Final      Radiology Studies: Dg Chest 2 View  Result Date: 12/20/2016 CLINICAL DATA:  Productive cough and chest congestion EXAM: CHEST  2 VIEW COMPARISON:  08/19/2016, 06/09/2016, 11/14/2015. FINDINGS: Hyperinflation with right greater than left basilar pleural and parenchymal scarring. Ill-defined ground-glass density in the left lung base. Stable slightly enlarged cardiomediastinal silhouette. No pneumothorax. IMPRESSION: 1. Hyperinflation with emphysematous disease and chronic right greater than left basilar scarring 2. Increased ground-glass opacity at the left base may reflect superimposed infiltrate. Electronically Signed   By: Donavan Foil M.D.   On: 12/20/2016 20:51     Scheduled Meds: . atorvastatin  20 mg Oral Daily  . diltiazem  360 mg Oral Daily  . insulin aspart  0-9 Units Subcutaneous TID WC  . levothyroxine  112 mcg Oral QAC breakfast  . metoprolol tartrate  12.5 mg Oral BID  . mometasone-formoterol  2 puff Inhalation BID  . omega-3 acid ethyl esters  1 g Oral Daily  . pantoprazole  40 mg Oral Daily  . warfarin  5 mg Oral Once per day on Sun Thu  . [START ON 12/22/2016] warfarin  7.5 mg Oral Once per day on Mon Tue Wed Fri Sat  . Warfarin - Pharmacist Dosing Inpatient   Does not apply q1800   Continuous Infusions: . sodium chloride 75 mL/hr at 12/21/16 1255  . azithromycin    . cefTRIAXone (ROCEPHIN)  IV       Marzetta Board,  MD, PhD [facility] Pager [phone]  If 7PM-7AM, please contact night-coverage [email or web address] Password [email or web address] 12/21/2016, 2:01 PM

## 2016-12-21 NOTE — Progress Notes (Signed)
Initial Nutrition Assessment  DOCUMENTATION CODES:   Not applicable  INTERVENTION:   -Glucerna Shake po TID, each supplement provides 220 kcal and 10 grams of protein  NUTRITION DIAGNOSIS:   Increased nutrient needs related to acute illness (sepsis) as evidenced by estimated needs.  GOAL:   Patient will meet greater than or equal to 90% of their needs  MONITOR:   PO intake, Supplement acceptance, Labs, Weight trends, Skin, I & O's  REASON FOR ASSESSMENT:   Consult Assessment of nutrition requirement/status  ASSESSMENT:   JB DULWORTH is a 83 y.o. male with medical history significant of hypertension, hyperlipidemia, diabetes mellitus, COPD, GERD, hypothyroidism, PAF and PE on Coumadin, sCHF, CKD-II, lung cancer (s/p of topectomy, radiation and chemotherapy), who presents with SOB, cough and chills, chest congestion.  Pt admitted with sepsis related to pneumonia.   Spoke with pt at bedside. He reports fair appetite. He reports fair intake of breakfast (oatmeal and eggs), because he did not like the taste of food. He usually eats 3 meals per day of Breakfast: eggs, toast, coffee, Lunch: moon pie, Dinner: meat, starch, and vegetable.   Pt reports UBW is around 185#. He reports he lost weight "a long time ago" and feels his clothes are looser. Noted upward wt gain trend.   Nutrition-Focused physical exam completed. Findings are mild fat depletion, mild muscle depletion, and no edema. Suspect muscle depletions may be related to advanced age.   Last Hgb A1c: 8.9 (08/26/2016). Pt with history of poorly controlled DM. Home DM medication regimen 10 mg glipizide BID. Pt reports home CBGS range from 100-150.  Pt with hx of malnutrition, however, unable to identify at this time. Per previous RD notes, wt and exam have improved since last admission, however, would still benefit from supplements.  Labs reviewed: K: 3.4, CBGS: 112 (inpatinet orders for glycemic control are 0-9 units  insulin aspart TID).   Diet Order:  Diet heart healthy/carb modified Room service appropriate? Yes; Fluid consistency: Thin  Skin:  Reviewed, no issues  Last BM:  12/20/16  Height:   Ht Readings from Last 1 Encounters:  12/20/16 6\' 2"  (1.88 m)    Weight:   Wt Readings from Last 1 Encounters:  12/20/16 192 lb 3.9 oz (87.2 kg)    Ideal Body Weight:  86.4 kg  BMI:  Body mass index is 24.68 kg/m.  Estimated Nutritional Needs:   Kcal:  2000-2200  Protein:  95-110 grams  Fluid:  2.0-2.2 L  EDUCATION NEEDS:   Education needs addressed  Mustaf Antonacci A. Jimmye Norman, RD, LDN, CDE Pager: [phone] After hours Pager: [phone]

## 2016-12-21 NOTE — Progress Notes (Signed)
ANTICOAGULATION CONSULT NOTE - Follow Up Consult  Pharmacy Consult for warfarin Indication: History of atrial fibrillation and pulmonary embolus  No Known Allergies  Patient Measurements: Height: 6\' 2"  (188 cm) Weight: 192 lb 3.9 oz (87.2 kg) IBW/kg (Calculated) : 82.2  Vital Signs: Temp: 98 F (36.7 C) (10/04 0446) Temp Source: Oral (10/04 0446) BP: 105/72 (10/04 1013) Pulse Rate: 80 (10/04 1013)  Labs:  Recent Labs  12/20/16 1942 12/20/16 2153 12/21/16 0003  HGB 16.2  --   --   HCT 48.1  --   --   PLT 241  --   --   LABPROT  --  24.9* 26.5*  INR  --  2.28 2.46  CREATININE 1.42*  --   --     Estimated Creatinine Clearance: 46.6 mL/min (A) (by C-G formula based on SCr of 1.42 mg/dL (H)).   Medications:  Prescriptions Prior to Admission  Medication Sig Dispense Refill Last Dose  . albuterol (PROVENTIL HFA;VENTOLIN HFA) 108 (90 Base) MCG/ACT inhaler Inhale 2 puffs into the lungs every 6 (six) hours as needed for wheezing or shortness of breath. 1 Inhaler 2 unknown at prn  . Artificial Tear Ointment (DRY EYES OP) Place 1 drop into both eyes as needed (dry eye).   Past Week at Unknown time  . atorvastatin (LIPITOR) 20 MG tablet TAKE 1 TABLET BY MOUTH  DAILY (Patient taking differently: TAKE 20 mg TABLET BY MOUTH  DAILY) 90 tablet 2 12/20/2016 at Unknown time  . budesonide-formoterol (SYMBICORT) 80-4.5 MCG/ACT inhaler Inhale 2 puffs into the lungs every morning. 1 Inhaler 2 12/20/2016 at Unknown time  . dextromethorphan-guaiFENesin (MUCINEX DM) 30-600 MG 12hr tablet Take 1 tablet by mouth 2 (two) times daily as needed for cough. 14 tablet 0 12/20/2016 at prn  . diltiazem (CARDIZEM CD) 360 MG 24 hr capsule Take 1 capsule (360 mg total) by mouth daily. 90 capsule 3 12/20/2016 at Unknown time  . fish oil-omega-3 fatty acids 1000 MG capsule Take 1 g by mouth daily.    12/20/2016 at Unknown time  . furosemide (LASIX) 20 MG tablet Take 2 tablets (40 mg total) by mouth daily. 180 tablet  2 12/20/2016 at Unknown time  . glipiZIDE (GLUCOTROL) 10 MG tablet Take 10 mg by mouth daily.  3 12/20/2016 at Unknown time  . levothyroxine (SYNTHROID, LEVOTHROID) 112 MCG tablet Take 112 mcg by mouth daily.     12/20/2016 at 0800  . lisinopril (PRINIVIL,ZESTRIL) 5 MG tablet Take 0.5 tablets (2.5 mg total) by mouth daily. 90 tablet 3 12/20/2016 at Unknown time  . metoprolol tartrate (LOPRESSOR) 25 MG tablet Take 12.5 mg by mouth 2 (two) times daily.    12/20/2016 at 1000  . nitroGLYCERIN (NITROSTAT) 0.4 MG SL tablet Place 1 tablet (0.4 mg total) under the tongue every 5 (five) minutes as needed for chest pain up to 3 doses 25 tablet 3 unknown at prn  . omeprazole (PRILOSEC) 20 MG capsule Take 20 mg by mouth every other day.    12/19/2016 at Unknown time  . warfarin (COUMADIN) 5 MG tablet Take 5 mg by mouth See admin instructions. Take 5 mg on Sunday and Thursday Takes 7.5 mg on Mon. Tues. Wed. Fri. And sat.   12/20/2016 at Unknown time  . metoprolol tartrate (LOPRESSOR) 25 MG tablet TAKE 1/2 TABLET BY MOUTH TWICE A DAY (Patient not taking: Reported on 12/20/2016) 90 tablet 0 Not Taking at Unknown time  . senna-docusate (SENOKOT-S) 8.6-50 MG tablet Take 1 tablet by  mouth at bedtime as needed for mild constipation. (Patient not taking: Reported on 12/20/2016) 10 tablet 0 Not Taking at Unknown time    Assessment: 5 yom presenting with cough/SOB on warfarin PTA for afib and PE history. Pharmacy consulted to dose warfarin inpatient. INR 2.28>2.46, CBC wnl. No bleed documented. Noted started on ceftriaxone/azithromycin for CAP - may affect INR. PO intake documented as 50%  PTA dose: warfarin 7.5mg  daily except 5mg  on Sun/Thurs (last dose 10/3 PTA)  Goal of Therapy:  INR 2-3 Monitor platelets by anticoagulation protocol: Yes   Plan:  Continue home dosing warfarin for now, 5 mg PO on Sunday and Thursday, and 7.5 mg on all other days Monitor daily INR, CBC, clinical course, s/sx of bleed, PO intake,  DDI   Thank you for allowing [place] to participate in this patients care.  Jens Som, PharmD Clinical phone for 12/21/2016 from 7a-3:30p: [phone] If after 3:30p, please call main pharmacy at: [phone] 12/21/2016 10:51 AM

## 2016-12-22 ENCOUNTER — Telehealth: Payer: Self-pay | Admitting: *Deleted

## 2016-12-22 LAB — CBC
HCT: 42.6 % (ref 39.0–52.0)
HEMOGLOBIN: 14.1 g/dL (ref 13.0–17.0)
MCH: 30.2 pg (ref 26.0–34.0)
MCHC: 33.1 g/dL (ref 30.0–36.0)
MCV: 91.2 fL (ref 78.0–100.0)
Platelets: 208 10*3/uL (ref 150–400)
RBC: 4.67 MIL/uL (ref 4.22–5.81)
RDW: 14.9 % (ref 11.5–15.5)
WBC: 15.3 10*3/uL — ABNORMAL HIGH (ref 4.0–10.5)

## 2016-12-22 LAB — BASIC METABOLIC PANEL
Anion gap: 12 (ref 5–15)
BUN: 18 mg/dL (ref 6–20)
CHLORIDE: 103 mmol/L (ref 101–111)
CO2: 22 mmol/L (ref 22–32)
Calcium: 8.3 mg/dL — ABNORMAL LOW (ref 8.9–10.3)
Creatinine, Ser: 1.22 mg/dL (ref 0.61–1.24)
GFR calc Af Amer: >60 mL/min (ref >60)
GFR calc non Af Amer: 53 mL/min — ABNORMAL LOW (ref >60)
GLUCOSE: 109 mg/dL — AB (ref 65–99)
Potassium: 2.9 mmol/L — ABNORMAL LOW (ref 3.5–5.1)
Sodium: 137 mmol/L (ref 135–145)

## 2016-12-22 LAB — PROTIME-INR
INR: 2.08
Prothrombin Time: 23.2 seconds — ABNORMAL HIGH (ref 11.4–15.2)

## 2016-12-22 LAB — GLUCOSE, CAPILLARY
GLUCOSE-CAPILLARY: 178 mg/dL — AB (ref 65–99)
Glucose-Capillary: 119 mg/dL — ABNORMAL HIGH (ref 65–99)
Glucose-Capillary: 176 mg/dL — ABNORMAL HIGH (ref 65–99)
Glucose-Capillary: 121 mg/dL — ABNORMAL HIGH (ref 65–99)

## 2016-12-22 LAB — LEGIONELLA PNEUMOPHILA SEROGP 1 UR AG: L. pneumophila Serogp 1 Ur Ag: NEGATIVE

## 2016-12-22 MED ORDER — POTASSIUM CHLORIDE CRYS ER 20 MEQ PO TBCR
40.0000 meq | EXTENDED_RELEASE_TABLET | Freq: Two times a day (BID) | ORAL | Status: AC
  Administered 2016-12-22 (×2): 40 meq via ORAL
  Filled 2016-12-22 (×2): qty 2

## 2016-12-22 NOTE — Progress Notes (Signed)
Physical Therapy Treatment Patient Details Name: Justin Garner MRN: [id number] DOB: 04/07/1932 Today's Date: 12/22/2016    History of Present Illness Justin Garner is a 83 y.o. male with medical history significant of hypertension, hyperlipidemia, diabetes mellitus, COPD, GERD, hypothyroidism, PAF and PE on Coumadin, sCHF, CKD-II, lung cancer (s/p of lobectomy, radiation and chemotherapy), who presents with SOB, cough and chills, chest congestion.      PT Comments    Pt was up with NT on arrival, as he had been requesting to walk. Pt is motivated to stay get better. Today's skilled session focused on gait training and stairs. He increased ambulation distance today, however utilized a RW instead of a cane. Pt is progressing well towards goals, and would benefit from continued skilled PT to achieve unmet goals, and improve safety with mobility. Will continue to follow acutely.    Follow Up Recommendations  Home health PT     Equipment Recommendations  None recommended by PT    Recommendations for Other Services       Precautions / Restrictions Precautions Precautions: Fall Restrictions Weight Bearing Restrictions: No    Mobility  Bed Mobility Overal bed mobility: Modified Independent             General bed mobility comments: up with NT on arrival  Transfers Overall transfer level: Needs assistance Equipment used: Rolling walker (2 wheeled) Transfers: Sit to/from Stand Sit to Stand: Min guard         General transfer comment: Cueing for correct technqiue and hand placement. Pt typically used straight cane.  Ambulation/Gait Ambulation/Gait assistance: Supervision;Min guard Ambulation Distance (Feet): 350 Feet Assistive device: Rolling walker (2 wheeled) Gait Pattern/deviations: Step-through pattern;Decreased stride length;Trunk flexed Gait velocity: decreased Gait velocity interpretation: Below normal speed for age/gender General Gait Details: slow pace,  but relatively steady with RW, assist for safety and cueing for walker proximity. Required one seated rest break during ambulation.   Stairs Stairs: Yes   Stair Management: Two rails;Alternating pattern;Step to pattern;Forwards Number of Stairs: 4 General stair comments: Pt ascends stairs reciprocally, and descends with a step to pattern. Pt overall steady with steps.  Wheelchair Mobility    Modified Rankin (Stroke Patients Only)       Balance Overall balance assessment: Needs assistance   Sitting balance-Leahy Scale: Good     Standing balance support: No upper extremity supported Standing balance-Leahy Scale: Fair Standing balance comment: standing to wash hands in sink after toileting in bathroom                            Cognition Arousal/Alertness: Awake/alert Behavior During Therapy: WFL for tasks assessed/performed Overall Cognitive Status: Within Functional Limits for tasks assessed                                        Exercises      General Comments General comments (skin integrity, edema, etc.): Wife present and helpful throughout session.      Pertinent Vitals/Pain Pain Assessment: No/denies pain    Home Living                      Prior Function            PT Goals (current goals can now be found in the care plan section) Acute Rehab PT Goals Patient Stated Goal:  To go home PT Goal Formulation: With patient/family Time For Goal Achievement: 12/28/16 Potential to Achieve Goals: Good Progress towards PT goals: Progressing toward goals    Frequency    Min 3X/week      PT Plan Current plan remains appropriate    Co-evaluation              AM-PAC PT "6 Clicks" Daily Activity  Outcome Measure  Difficulty turning over in bed (including adjusting bedclothes, sheets and blankets)?: None Difficulty moving from lying on back to sitting on the side of the bed? : None Difficulty sitting down on and  standing up from a chair with arms (e.g., wheelchair, bedside commode, etc,.)?: Unable Help needed moving to and from a bed to chair (including a wheelchair)?: A Little Help needed walking in hospital room?: A Little Help needed climbing 3-5 steps with a railing? : A Little 6 Click Score: 18    End of Session Equipment Utilized During Treatment: Gait belt Activity Tolerance: Patient tolerated treatment well Patient left: with call bell/phone within reach;with family/visitor present;in chair Nurse Communication: Mobility status PT Visit Diagnosis: Other abnormalities of gait and mobility (R26.89);Muscle weakness (generalized) (M62.81)     Time: 0300-9233 PT Time Calculation (min) (ACUTE ONLY): 27 min  Charges:  $Gait Training: 23-37 mins                    G Codes:       Benjiman Core, [place] Pager [phone] Acute Rehab   Allena Katz 12/22/2016, 11:33 AM

## 2016-12-22 NOTE — Progress Notes (Signed)
CM spoke with pt regarding  PT evaluation/ recommendation: home health PT. Pt declines home health physical therapy services. States if he changes his mind once d/c he will f/u with PCP. Whitman Hero RN,BSN, CM

## 2016-12-22 NOTE — Progress Notes (Signed)
ANTICOAGULATION CONSULT NOTE - Follow Up Consult  Pharmacy Consult for warfarin Indication: History of atrial fibrillation and pulmonary embolus  No Known Allergies  Patient Measurements: Height: 6\' 2"  (188 cm) Weight: 192 lb 3.9 oz (87.2 kg) IBW/kg (Calculated) : 82.2  Vital Signs: Temp: 98.4 F (36.9 C) (10/05 0513) Temp Source: Oral (10/05 0513) BP: 117/75 (10/05 0513) Pulse Rate: 78 (10/05 0513)  Labs:  Recent Labs  12/20/16 1942 12/20/16 2153 12/21/16 0003 12/21/16 0959 12/22/16 0532  HGB 16.2  --   --   --  14.1  HCT 48.1  --   --   --  42.6  PLT 241  --   --   --  208  LABPROT  --  24.9* 26.5*  --  23.2*  INR  --  2.28 2.46  --  2.08  CREATININE 1.42*  --   --  1.33* 1.22    Estimated Creatinine Clearance: 54.3 mL/min (by C-G formula based on SCr of 1.22 mg/dL).   Medications:  Prescriptions Prior to Admission  Medication Sig Dispense Refill Last Dose  . albuterol (PROVENTIL HFA;VENTOLIN HFA) 108 (90 Base) MCG/ACT inhaler Inhale 2 puffs into the lungs every 6 (six) hours as needed for wheezing or shortness of breath. 1 Inhaler 2 unknown at prn  . Artificial Tear Ointment (DRY EYES OP) Place 1 drop into both eyes as needed (dry eye).   Past Week at Unknown time  . atorvastatin (LIPITOR) 20 MG tablet TAKE 1 TABLET BY MOUTH  DAILY (Patient taking differently: TAKE 20 mg TABLET BY MOUTH  DAILY) 90 tablet 2 12/20/2016 at Unknown time  . budesonide-formoterol (SYMBICORT) 80-4.5 MCG/ACT inhaler Inhale 2 puffs into the lungs every morning. 1 Inhaler 2 12/20/2016 at Unknown time  . dextromethorphan-guaiFENesin (MUCINEX DM) 30-600 MG 12hr tablet Take 1 tablet by mouth 2 (two) times daily as needed for cough. 14 tablet 0 12/20/2016 at prn  . diltiazem (CARDIZEM CD) 360 MG 24 hr capsule Take 1 capsule (360 mg total) by mouth daily. 90 capsule 3 12/20/2016 at Unknown time  . fish oil-omega-3 fatty acids 1000 MG capsule Take 1 g by mouth daily.    12/20/2016 at Unknown time  .  furosemide (LASIX) 20 MG tablet Take 2 tablets (40 mg total) by mouth daily. 180 tablet 2 12/20/2016 at Unknown time  . glipiZIDE (GLUCOTROL) 10 MG tablet Take 10 mg by mouth daily.  3 12/20/2016 at Unknown time  . levothyroxine (SYNTHROID, LEVOTHROID) 112 MCG tablet Take 112 mcg by mouth daily.     12/20/2016 at 0800  . lisinopril (PRINIVIL,ZESTRIL) 5 MG tablet Take 0.5 tablets (2.5 mg total) by mouth daily. 90 tablet 3 12/20/2016 at Unknown time  . metoprolol tartrate (LOPRESSOR) 25 MG tablet Take 12.5 mg by mouth 2 (two) times daily.    12/20/2016 at 1000  . nitroGLYCERIN (NITROSTAT) 0.4 MG SL tablet Place 1 tablet (0.4 mg total) under the tongue every 5 (five) minutes as needed for chest pain up to 3 doses 25 tablet 3 unknown at prn  . omeprazole (PRILOSEC) 20 MG capsule Take 20 mg by mouth every other day.    12/19/2016 at Unknown time  . warfarin (COUMADIN) 5 MG tablet Take 5 mg by mouth See admin instructions. Take 5 mg on Sunday and Thursday Takes 7.5 mg on Mon. Tues. Wed. Fri. And sat.   12/20/2016 at Unknown time  . metoprolol tartrate (LOPRESSOR) 25 MG tablet TAKE 1/2 TABLET BY MOUTH TWICE A DAY (Patient  not taking: Reported on 12/20/2016) 90 tablet 0 Not Taking at Unknown time  . senna-docusate (SENOKOT-S) 8.6-50 MG tablet Take 1 tablet by mouth at bedtime as needed for mild constipation. (Patient not taking: Reported on 12/20/2016) 10 tablet 0 Not Taking at Unknown time    Assessment: 66 yom presenting with cough/SOB on warfarin PTA for afib and PE history. Pharmacy consulted to dose warfarin inpatient. INR remains therapeutic today at 2.08, CBC wnl. No bleed documented. Noted started on ceftriaxone/azithromycin for CAP - may affect INR. PO intake documented as 50%  PTA dose: warfarin 7.5mg  daily except 5mg  on Sun/Thurs (last dose 10/3 PTA)  Goal of Therapy:  INR 2-3 Monitor platelets by anticoagulation protocol: Yes   Plan:  Continue home dosing warfarin for now, 5 mg PO on Sunday and  Thursday, and 7.5 mg on all other days Monitor daily INR, CBC, clinical course, s/sx of bleed, PO intake, DDI   Thank you for allowing [place] to participate in this patients care.  Jens Som, PharmD Clinical phone for 12/22/2016 from 7a-3:30p: [phone] If after 3:30p, please call main pharmacy at: [phone] 12/22/2016 9:27 AM

## 2016-12-22 NOTE — Telephone Encounter (Signed)
Wife called to notify us that the pt was in the Hospital & may come home tomorrow. Advised her to call back once pt is discharged so his future appt can be moved dependent upon his Hospital MD recommendations, discharge meds,  & INR on discharge. She verbalized understanding.

## 2016-12-22 NOTE — Progress Notes (Signed)
PROGRESS NOTE  Justin Garner WIO:[id number] DOB: [birth year] DOA: 12/20/2016 PCP: [name], MD   LOS: 2 days   Brief Narrative / Interim history: Justin Garner is a 83 y.o. male with medical history significant of hypertension, hyperlipidemia, diabetes mellitus, COPD, GERD, hypothyroidism, PAF and PE on Coumadin, sCHF, CKD-II, lung cancer (s/p of lobectomy, radiation and chemotherapy), who presents with SOB, cough and chills, chest congestion.  He tells me he has been having cough, shortness of breath and chest congestion since couple of days prior to coming to the ED.    Assessment & Plan: Principal Problem:   Lobar pneumonia ([place]) Active Problems:   Chronic atrial fibrillation ([place])   Pulmonary embolism ([facility])   Essential hypertension, benign   Sepsis ([place])   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 ([place])   CAP (community acquired pneumonia)   Chronic systolic CHF (congestive heart failure) ([facility])   Hypothyroidism   HLD (hyperlipidemia)   Acute on chronic kidney failure-II   Sepsis due to community-acquired pneumonia, left lower lobe pneumonia -Patient met criteria for sepsis with tachypnea, tachycardia as well as leukocytosis and apparent source of the chest x-ray -Started on antibiotics with ceftriaxone and azithromycin in the ED, continue -Respiratory panel negative -Strep pneumo negative, Legionella pending -White count still 15 today, continue IV antibiotics for another 24 hours, may be able to go home tomorrow  COPD -No wheezing, continue Dulera, continue as needed nebulizers  A. fib -Continue Coumadin, rate controlled with metoprolol and Cardizem  History of PE -Continue Coumadin  Hypertension -Continue metoprolol, Cardizem, hold Lasix and lisinopril due to soft blood pressure and worsening renal function  Hypokalemia -Likely due to home diuretics, replete  Primary cancer of right upper lobe of lung -Status post resection, followed by Dr. Julien Nordmann as an  outpatient  Type 2 diabetes mellitus -Continue sliding scale  Hyperlipidemia -Continue Lipitor  Hypothyroidism -Continue Synthroid  Chronic kidney disease stage II -Creatinine at baseline today  Chronic diastolic CHF -Appears compensated, hold Lasix as above -Stop IV fluids today  Severe protein calorie malnutrition   DVT prophylaxis: Coumadin Code Status: Full code Family Communication: No family at bedside Disposition Plan: Discharge home 2-3 days  Consultants:   None  Procedures:   None   Antimicrobials:  Ceftriaxone 10/3 >>  Azithromycin 10/3 >>  Subjective: -Feeling well, denies any chest pain, denies any shortness of breath.  No abdominal pain, nausea or vomiting.  Objective: Vitals:   12/21/16 2054 12/22/16 0513 12/22/16 0900 12/22/16 0931  BP: 136/90 117/75  128/71  Pulse: 81 78  69  Resp: 18 18    Temp: 98.3 F (36.8 C) 98.4 F (36.9 C)    TempSrc: Oral Oral    SpO2: 96% 91% 93%   Weight:      Height:        Intake/Output Summary (Last 24 hours) at 12/22/16 1349 Last data filed at 12/22/16 1108  Gross per 24 hour  Intake                0 ml  Output              595 ml  Net             -595 ml   Filed Weights   12/20/16 2338  Weight: 87.2 kg (192 lb 3.9 oz)    Examination:  Constitutional: NAD, calm, comfortable Eyes: lids and conjunctivae normal ENMT: Mucous membranes are moist.  Neck: normal, supple Respiratory: clear  to auscultation bilaterally, no wheezing, no crackles. Normal respiratory effort.  Overall decreased breath sounds Cardiovascular: Regular rate and rhythm, no murmurs / rubs / gallops. No LE edema. 2+ pedal pulses.  Abdomen: no tenderness. Bowel sounds positive.  Skin: no rashes, lesions, ulcers. No induration Neurologic: non focal    Data Reviewed: I have independently reviewed following labs and imaging studies   CBC:  Recent Labs Lab 12/20/16 1942 12/22/16 0532  WBC 16.0* 15.3*  NEUTROABS 13.2*  --    HGB 16.2 14.1  HCT 48.1 42.6  MCV 92.0 91.2  PLT 241 884   Basic Metabolic Panel:  Recent Labs Lab 12/20/16 1942 12/21/16 0959 12/22/16 0532  NA 139 138 137  K 3.9 3.4* 2.9*  CL 103 105 103  CO2 _0 GLUCOSE 191* 140* 109*  BUN 29* 24* 18  CREATININE 1.42* 1.33* 1.22  CALCIUM 8.6* 8.1* 8.3*   GFR: Estimated Creatinine Clearance: 54.3 mL/min (by C-G formula based on SCr of 1.22 mg/dL). Liver Function Tests: No results for input(s): AST, ALT, ALKPHOS, BILITOT, PROT, ALBUMIN in the last 168 hours. No results for input(s): LIPASE, AMYLASE in the last 168 hours. No results for input(s): AMMONIA in the last 168 hours. Coagulation Profile:  Recent Labs Lab 12/20/16 2153 12/21/16 0003 12/22/16 0532  INR 2.28 2.46 2.08   Cardiac Enzymes: No results for input(s): CKTOTAL, CKMB, CKMBINDEX, TROPONINI in the last 168 hours. BNP (last 3 results) No results for input(s): PROBNP in the last 8760 hours. HbA1C: No results for input(s): HGBA1C in the last 72 hours. CBG:  Recent Labs Lab 12/21/16 1257 12/21/16 1723 12/21/16 2053 12/22/16 0757 12/22/16 1218  GLUCAP 112* 160* 85 119* 176*   Lipid Profile: No results for input(s): CHOL, HDL, LDLCALC, TRIG, CHOLHDL, LDLDIRECT in the last 72 hours. Thyroid Function Tests: No results for input(s): TSH, T4TOTAL, FREET4, T3FREE, THYROIDAB in the last 72 hours. Anemia Panel: No results for input(s): VITAMINB12, FOLATE, FERRITIN, TIBC, IRON, RETICCTPCT in the last 72 hours. Urine analysis:    Component Value Date/Time   COLORURINE YELLOW 08/18/2016 [place] 08/18/2016 1509   LABSPEC 1.017 08/18/2016 1509   PHURINE 5.0 08/18/2016 1509   GLUCOSEU NEGATIVE 08/18/2016 1509   HGBUR NEGATIVE 08/18/2016 1509   BILIRUBINUR NEGATIVE 08/18/2016 1509   KETONESUR NEGATIVE 08/18/2016 1509   PROTEINUR 30 (A) 08/18/2016 1509   UROBILINOGEN 4.0 (H) 12/27/2013 2225   NITRITE NEGATIVE 08/18/2016 1509   LEUKOCYTESUR  NEGATIVE 08/18/2016 1509   Sepsis Labs: Invalid input(s): PROCALCITONIN, LACTICIDVEN  Recent Results (from the past 240 hour(s))  Blood Culture (routine x 2)     Status: None (Preliminary result)   Collection Time: 12/21/16 12:00 AM  Result Value Ref Range Status   Specimen Description BLOOD BLOOD RIGHT FOREARM  Final   Special Requests IN PEDIATRIC BOTTLE Blood Culture adequate volume  Final   Culture PENDING  Incomplete   Report Status PENDING  Incomplete  Blood Culture (routine x 2)     Status: None (Preliminary result)   Collection Time: 12/21/16 12:05 AM  Result Value Ref Range Status   Specimen Description BLOOD BLOOD LEFT FOREARM  Final   Special Requests   Final    BOTTLES DRAWN AEROBIC AND ANAEROBIC Blood Culture adequate volume   Culture PENDING  Incomplete   Report Status PENDING  Incomplete  Culture, sputum-assessment     Status: None   Collection Time: 12/21/16 12:13 AM  Result Value Ref Range Status  Specimen Description EXPECTORATED SPUTUM  Final   Special Requests NONE  Final   Sputum evaluation THIS SPECIMEN IS ACCEPTABLE FOR SPUTUM CULTURE  Final   Report Status 12/21/2016 FINAL  Final  Culture, respiratory (NON-Expectorated)     Status: None (Preliminary result)   Collection Time: 12/21/16 12:13 AM  Result Value Ref Range Status   Specimen Description EXPECTORATED SPUTUM  Final   Special Requests NONE Reflexed from [id number]  Final   Gram Stain   Final    ABUNDANT WBC PRESENT, PREDOMINANTLY PMN ABUNDANT GRAM POSITIVE COCCI MODERATE GRAM POSITIVE RODS FEW GRAM NEGATIVE RODS    Culture CULTURE REINCUBATED FOR BETTER GROWTH  Final   Report Status PENDING  Incomplete  Respiratory Panel by PCR     Status: None   Collection Time: 12/21/16  1:46 AM  Result Value Ref Range Status   Adenovirus NOT DETECTED NOT DETECTED Final   Coronavirus 229E NOT DETECTED NOT DETECTED Final   Coronavirus HKU1 NOT DETECTED NOT DETECTED Final   Coronavirus NL63 NOT DETECTED NOT  DETECTED Final   Coronavirus OC43 NOT DETECTED NOT DETECTED Final   Metapneumovirus NOT DETECTED NOT DETECTED Final   Rhinovirus / Enterovirus NOT DETECTED NOT DETECTED Final   Influenza A NOT DETECTED NOT DETECTED Final   Influenza A H1 NOT DETECTED NOT DETECTED Final   Influenza A H1 2009 NOT DETECTED NOT DETECTED Final   Influenza A H3 NOT DETECTED NOT DETECTED Final   Influenza B NOT DETECTED NOT DETECTED Final   Parainfluenza Virus 1 NOT DETECTED NOT DETECTED Final   Parainfluenza Virus 2 NOT DETECTED NOT DETECTED Final   Parainfluenza Virus 3 NOT DETECTED NOT DETECTED Final   Parainfluenza Virus 4 NOT DETECTED NOT DETECTED Final   Respiratory Syncytial Virus NOT DETECTED NOT DETECTED Final   Bordetella pertussis NOT DETECTED NOT DETECTED Final   Chlamydophila pneumoniae NOT DETECTED NOT DETECTED Final   Mycoplasma pneumoniae NOT DETECTED NOT DETECTED Final      Radiology Studies: Dg Chest 2 View  Result Date: 12/20/2016 CLINICAL DATA:  Productive cough and chest congestion EXAM: CHEST  2 VIEW COMPARISON:  08/19/2016, 06/09/2016, 11/14/2015. FINDINGS: Hyperinflation with right greater than left basilar pleural and parenchymal scarring. Ill-defined ground-glass density in the left lung base. Stable slightly enlarged cardiomediastinal silhouette. No pneumothorax. IMPRESSION: 1. Hyperinflation with emphysematous disease and chronic right greater than left basilar scarring 2. Increased ground-glass opacity at the left base may reflect superimposed infiltrate. Electronically Signed   By: Donavan Foil M.D.   On: 12/20/2016 20:51     Scheduled Meds: . atorvastatin  20 mg Oral Daily  . diltiazem  360 mg Oral Daily  . feeding supplement (GLUCERNA SHAKE)  237 mL Oral TID BM  . insulin aspart  0-9 Units Subcutaneous TID WC  . levothyroxine  112 mcg Oral QAC breakfast  . metoprolol tartrate  12.5 mg Oral BID  . mometasone-formoterol  2 puff Inhalation BID  . omega-3 acid ethyl esters  1  g Oral Daily  . pantoprazole  40 mg Oral Daily  . potassium chloride  40 mEq Oral BID  . warfarin  5 mg Oral Once per day on Sun Thu  . warfarin  7.5 mg Oral Once per day on Mon Tue Wed Fri Sat  . Warfarin - Pharmacist Dosing Inpatient   Does not apply q1800   Continuous Infusions: . azithromycin Stopped (12/21/16 2330)  . cefTRIAXone (ROCEPHIN)  IV Stopped (12/21/16 2211)     Marzetta Board,  MD, PhD [facility] Pager [phone]  If 7PM-7AM, please contact night-coverage [email or web address] Password [email or web address] 12/22/2016, 1:49 PM

## 2016-12-23 LAB — PROTIME-INR
INR: 1.82
Prothrombin Time: 20.9 seconds — ABNORMAL HIGH (ref 11.4–15.2)

## 2016-12-23 LAB — CBC
HEMATOCRIT: 40.7 % (ref 39.0–52.0)
HEMOGLOBIN: 13.9 g/dL (ref 13.0–17.0)
MCH: 31.3 pg (ref 26.0–34.0)
MCHC: 34.2 g/dL (ref 30.0–36.0)
MCV: 91.7 fL (ref 78.0–100.0)
Platelets: 210 10*3/uL (ref 150–400)
RBC: 4.44 MIL/uL (ref 4.22–5.81)
RDW: 15.1 % (ref 11.5–15.5)
WBC: 12.5 10*3/uL — ABNORMAL HIGH (ref 4.0–10.5)

## 2016-12-23 LAB — GLUCOSE, CAPILLARY: Glucose-Capillary: 141 mg/dL — ABNORMAL HIGH (ref 65–99)

## 2016-12-23 LAB — CULTURE, RESPIRATORY W GRAM STAIN: Culture: NORMAL

## 2016-12-23 LAB — CULTURE, RESPIRATORY

## 2016-12-23 MED ORDER — DOXYCYCLINE HYCLATE 100 MG PO CAPS: 100.0000 mg | ORAL_CAPSULE | Freq: Two times a day (BID) | ORAL | 0 refills | Status: DC

## 2016-12-23 MED ORDER — POTASSIUM CHLORIDE ER 10 MEQ PO TBCR: 20.0000 meq | EXTENDED_RELEASE_TABLET | Freq: Every day | ORAL | 0 refills | Status: DC

## 2016-12-23 NOTE — Discharge Summary (Signed)
Physician Discharge Summary  Justin BEEZLEY JJK:[id number] DOB: May 03, 1932 DOA: 12/20/2016  PCP: [name], MD  Admit date: 12/20/2016 Discharge date: 12/23/2016  Admitted From: home Disposition:  Home with HHPT  Recommendations for Outpatient Follow-up:  1. Follow up with PCP in 1-2 weeks 2. Continue Doxycycline until finished  Home Health: PT Equipment/Devices: none  Discharge Condition: stable CODE STATUS: Full code Diet recommendation: heart healthy  HPI: Per Dr. Blaine Garner, Justin Garner is a 83 y.o. male with medical history significant of hypertension, hyperlipidemia, diabetes mellitus, COPD, GERD, hypothyroidism, PAF and PE on Coumadin, sCHF, CKD-II, lung cancer (s/p of topectomy, radiation and chemotherapy), who presents with SOB, cough and chills, chest congestion. Patient states that he started having cough, shortness breath, chest congestion since yesterday, which has been progressively getting worse. He has chills, but no fever. He coughs up white and clear mucus. Denies chest pain. Patient does not have nausea, vomiting, diarrhea, abdominal pain, symptoms of UTI or unilateral weakness. No leg edema ED Course: pt was found to have WBC 16.0, slightly worsening renal function, lactic acid is 2.75, BNP 231, temperature normal, no tachycardia, has tachypnea, O2 sat are 93% on room air. Chest x-ray showed left base groundglass opacity. Patient is admitted to telemetry bed as inpatient.   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  She was started on ceftriaxone and azithromycin, he was maintained on that while hospitalized, he improved clinically and patient appreciated that his respiratory status is back to baseline.  He was stable on room air.  His respiratory panel was negative.  Strep pneumo was negative, Legionella was negative as well.  He was afebrile and his white count has improved.  He was transitioned to oral antibiotics and he is to complete a course as an outpatient COPD -No wheezing, continue home medications A. Fib -Continue Coumadin, rate controlled with metoprolol and Cardizem History of PE -Continue Coumadin Hypertension -Continue home regimen Hypokalemia -Likely due to home diuretics, repleted and will have a short course of potassium at home Primary cancer of right upper lobe of lung -Status post resection, followed by Dr. Julien Garner as an outpatient Type 2 diabetes mellitus -Continue home regimen Hyperlipidemia -Continue Lipitor Hypothyroidism -Continue Synthroid Chronic kidney disease stage II -Creatinine at baseline  Chronic diastolic CHF -Appears compensated Severe protein calorie malnutrition  Discharge Instructions   Allergies as of 12/23/2016   No Known Allergies     Medication List    TAKE these medications   albuterol 108 (90 Base) MCG/ACT inhaler Commonly known as:  PROVENTIL HFA;VENTOLIN HFA Inhale 2 puffs into the lungs every 6 (six) hours as needed for wheezing or shortness of breath.   atorvastatin 20 MG tablet Commonly known as:  LIPITOR TAKE 1 TABLET BY MOUTH  DAILY What changed:  See the new instructions.   budesonide-formoterol 80-4.5 MCG/ACT inhaler Commonly known as:  SYMBICORT Inhale 2 puffs into the lungs every morning.  dextromethorphan-guaiFENesin 30-600 MG 12hr tablet Commonly known as:  MUCINEX DM Take 1 tablet by mouth 2 (two)  times daily as needed for cough.   diltiazem 360 MG 24 hr capsule Commonly known as:  CARDIZEM CD Take 1 capsule (360 mg total) by mouth daily.   doxycycline 100 MG capsule Commonly known as:  VIBRAMYCIN Take 1 capsule (100 mg total) by mouth 2 (two) times daily.   DRY EYES OP Place 1 drop into both eyes as needed (dry eye).   fish oil-omega-3 fatty acids 1000 MG capsule Take 1 g by mouth daily.   furosemide 20 MG tablet Commonly known as:  LASIX Take 2 tablets (40 mg total) by mouth daily.   glipiZIDE 10 MG tablet Commonly known as:  GLUCOTROL Take 10 mg by mouth daily.   levothyroxine 112 MCG tablet Commonly known as:  SYNTHROID, LEVOTHROID Take 112 mcg by mouth daily.   lisinopril 5 MG tablet Commonly known as:  PRINIVIL,ZESTRIL Take 0.5 tablets (2.5 mg total) by mouth daily.   metoprolol tartrate 25 MG tablet Commonly known as:  LOPRESSOR Take 12.5 mg by mouth 2 (two) times daily. What changed:  Another medication with the same name was removed. Continue taking this medication, and follow the directions you see here.   nitroGLYCERIN 0.4 MG SL tablet Commonly known as:  NITROSTAT Place 1 tablet (0.4 mg total) under the tongue every 5 (five) minutes as needed for chest pain up to 3 doses   omeprazole 20 MG capsule Commonly known as:  PRILOSEC Take 20 mg by mouth every other day.   potassium chloride 10 MEQ tablet Commonly known as:  K-DUR Take 2 tablets (20 mEq total) by mouth daily.   senna-docusate 8.6-50 MG tablet Commonly known as:  Senokot-S Take 1 tablet by mouth at bedtime as needed for mild constipation.   warfarin 5 MG tablet Commonly known as:  COUMADIN Take 5 mg by mouth See admin instructions. Take 5 mg on Sunday and Thursday Takes 7.5 mg on Mon. Tues. Wed. Fri. And sat.      Consultations:  None   Procedures/Studies:  Dg Chest 2 View  Result Date: 12/20/2016 CLINICAL DATA:  Productive cough and chest congestion EXAM: CHEST  2 VIEW  COMPARISON:  08/19/2016, 06/09/2016, 11/14/2015. FINDINGS: Hyperinflation with right greater than left basilar pleural and parenchymal scarring. Ill-defined ground-glass density in the left lung base. Stable slightly enlarged cardiomediastinal silhouette. No pneumothorax. IMPRESSION: 1. Hyperinflation with emphysematous disease and chronic right greater than left basilar scarring 2. Increased ground-glass opacity at the left base may reflect superimposed infiltrate. Electronically Signed   By: Donavan Foil M.D.   On: 12/20/2016 20:51      Subjective: - no chest pain, shortness of breath, no abdominal pain, nausea or vomiting.   Discharge Exam:  BP (!) 132/95 (BP Location: Right Arm)   Pulse (!) 59   Temp 98 F (36.7 C) (Oral)   Resp 18   Ht _0  (1.88 m)   Wt 87.2 kg (192 lb 3.9 oz)   SpO2 98%   BMI 24.68 kg/m   Vitals:   12/23/16 0603 12/23/16 0809  BP:    Pulse:    Resp:    Temp:    SpO2: 97% 98%    General: Pt is alert, awake, not in acute distress Cardiovascular: irregular, no rubs, no gallops Respiratory: CTA bilaterally, no wheezing, no rhonchi Abdominal: Soft, NT, ND, bowel sounds + Extremities: no edema, no cyanosis  The results of significant diagnostics from this hospitalization (including imaging, microbiology, ancillary and laboratory) are listed below for reference.     Microbiology: Recent Results (from the past 240 hour(s))  Blood Culture (routine x 2)     Status: None (Preliminary result)   Collection Time: 12/21/16 12:00 AM  Result Value Ref Range Status   Specimen Description BLOOD BLOOD RIGHT FOREARM  Final   Special Requests IN PEDIATRIC BOTTLE Blood Culture adequate volume  Final   Culture NO GROWTH 1 DAY  Final   Report Status PENDING  Incomplete  Blood Culture (routine x 2)     Status: None (Preliminary result)   Collection Time: 12/21/16 12:05 AM  Result Value Ref Range Status   Specimen Description BLOOD BLOOD LEFT FOREARM  Final    Special Requests   Final    BOTTLES DRAWN AEROBIC AND ANAEROBIC Blood Culture adequate volume   Culture NO GROWTH 1 DAY  Final   Report Status PENDING  Incomplete  Culture, sputum-assessment     Status: None   Collection Time: 12/21/16 12:13 AM  Result Value Ref Range Status   Specimen Description EXPECTORATED SPUTUM  Final   Special Requests NONE  Final   Sputum evaluation THIS SPECIMEN IS ACCEPTABLE FOR SPUTUM CULTURE  Final   Report Status 12/21/2016 FINAL  Final  Culture, respiratory (NON-Expectorated)     Status: None (Preliminary result)   Collection Time: 12/21/16 12:13 AM  Result Value Ref Range Status   Specimen Description EXPECTORATED SPUTUM  Final   Special Requests NONE Reflexed from [id number]  Final   Gram Stain   Final    ABUNDANT WBC PRESENT, PREDOMINANTLY PMN ABUNDANT GRAM POSITIVE COCCI MODERATE GRAM POSITIVE RODS FEW GRAM NEGATIVE RODS    Culture CULTURE REINCUBATED FOR BETTER GROWTH  Final   Report Status PENDING  Incomplete  Respiratory Panel by PCR     Status: None   Collection Time: 12/21/16  1:46 AM  Result Value Ref Range Status   Adenovirus NOT DETECTED NOT DETECTED Final   Coronavirus 229E NOT DETECTED NOT DETECTED Final   Coronavirus HKU1 NOT DETECTED NOT DETECTED Final   Coronavirus NL63 NOT DETECTED NOT DETECTED Final   Coronavirus OC43 NOT DETECTED NOT DETECTED Final   Metapneumovirus NOT DETECTED NOT DETECTED Final   Rhinovirus / Enterovirus NOT DETECTED NOT DETECTED Final   Influenza A NOT DETECTED NOT DETECTED Final   Influenza A H1 NOT DETECTED NOT DETECTED Final   Influenza A H1 2009 NOT DETECTED NOT DETECTED Final   Influenza A H3 NOT DETECTED NOT DETECTED Final   Influenza B NOT DETECTED NOT DETECTED Final   Parainfluenza Virus 1 NOT DETECTED NOT DETECTED Final   Parainfluenza Virus 2 NOT DETECTED NOT DETECTED Final   Parainfluenza Virus 3 NOT DETECTED NOT DETECTED Final   Parainfluenza Virus 4 NOT DETECTED NOT DETECTED Final   Respiratory  Syncytial Virus NOT DETECTED NOT DETECTED Final   Bordetella pertussis NOT DETECTED NOT DETECTED Final   Chlamydophila pneumoniae NOT DETECTED NOT DETECTED Final   Mycoplasma pneumoniae NOT DETECTED NOT DETECTED Final     Labs: BNP (last 3 results)  Recent Labs  06/09/16 2357 08/18/16 1428 12/20/16 1942  BNP 249.8* 356.4* 433.2*   Basic Metabolic Panel:  Recent Labs Lab 12/20/16 1942 12/21/16 0959 12/22/16 0532  NA 139 138 137  K 3.9 3.4* 2.9*  CL 103 105 103  CO2 _0 GLUCOSE 191* 140* 109*  BUN 29* 24* 18  CREATININE 1.42* 1.33* 1.22  CALCIUM 8.6* 8.1* 8.3*   Liver Function Tests: No results for input(s): AST, ALT, ALKPHOS, BILITOT, PROT, ALBUMIN in the last 168 hours. No results for input(s): LIPASE, AMYLASE in the last 168 hours. No results for input(s): AMMONIA in the last 168 hours. CBC:  Recent Labs Lab 12/20/16 1942 12/22/16 0532 12/23/16 0400  WBC 16.0* 15.3* 12.5*  NEUTROABS 13.2*  --   --   HGB 16.2 14.1 13.9  HCT 48.1 42.6 40.7  MCV 92.0 91.2 91.7  PLT 241 208 210   Cardiac Enzymes: No results for input(s): CKTOTAL, CKMB, CKMBINDEX, TROPONINI in the last 168 hours. BNP: Invalid input(s): POCBNP CBG:  Recent Labs Lab 12/22/16 0757 12/22/16 1218 12/22/16 1654 12/22/16 2208 12/23/16 0749  GLUCAP 119* 176* 121* 178* 141*   D-Dimer No results for input(s): DDIMER in the last 72 hours. Hgb A1c No results for input(s): HGBA1C in the last 72 hours. Lipid Profile No results for input(s): CHOL, HDL, LDLCALC, TRIG, CHOLHDL, LDLDIRECT in the last 72 hours. Thyroid function studies No results for input(s): TSH, T4TOTAL, T3FREE, THYROIDAB in the last 72 hours.  Invalid input(s): FREET3 Anemia work up No results for input(s): VITAMINB12, FOLATE, FERRITIN, TIBC, IRON, RETICCTPCT in the last 72 hours. Urinalysis    Component Value Date/Time   COLORURINE YELLOW 08/18/2016 1509   APPEARANCEUR CLEAR 08/18/2016 1509   LABSPEC 1.017  08/18/2016 1509   PHURINE 5.0 08/18/2016 1509   GLUCOSEU NEGATIVE 08/18/2016 1509   HGBUR NEGATIVE 08/18/2016 1509   BILIRUBINUR NEGATIVE 08/18/2016 1509   KETONESUR NEGATIVE 08/18/2016 1509   PROTEINUR 30 (A) 08/18/2016 1509   UROBILINOGEN 4.0 (H) 12/27/2013 2225   NITRITE NEGATIVE 08/18/2016 1509   LEUKOCYTESUR NEGATIVE 08/18/2016 1509   Sepsis Labs Invalid input(s): PROCALCITONIN,  WBC,  LACTICIDVEN   Time coordinating discharge: 25 minutes  SIGNED:  Marzetta Board, MD  [facility] 12/23/2016, 8:29 AM Pager [phone]  If 7PM-7AM, please contact night-coverage [email or web address] Password [email or web address]

## 2016-12-23 NOTE — Discharge Instructions (Signed)
Follow with [name], MD in 5-7 days  Please get a complete blood count and chemistry panel checked by your Primary MD at your next visit, and again as instructed by your Primary MD. Please get your medications reviewed and adjusted by your Primary MD.  Please request your Primary MD to go over all Hospital Tests and Procedure/Radiological results at the follow up, please get all Hospital records sent to your Prim MD by signing hospital release before you go home.  If you had Pneumonia of Lung problems at the Hospital: Please get a 2 view Chest X ray done in 6-8 weeks after hospital discharge or sooner if instructed by your Primary MD.  If you have Congestive Heart Failure: Please call your Cardiologist or Primary MD anytime you have any of the following symptoms:  1) 3 pound weight gain in 24 hours or 5 pounds in 1 week  2) shortness of breath, with or without a dry hacking cough  3) swelling in the hands, feet or stomach  4) if you have to sleep on extra pillows at night in order to breathe  Follow cardiac low salt diet and 1.5 lit/day fluid restriction.  If you have diabetes Accuchecks 4 times/day, Once in AM empty stomach and then before each meal. Log in all results and show them to your primary doctor at your next visit. If any glucose reading is under 80 or above 300 call your primary MD immediately.  If you have Seizure/Convulsions/Epilepsy: Please do not drive, operate heavy machinery, participate in activities at heights or participate in high speed sports until you have seen by Primary MD or a Neurologist and advised to do so again.  If you had Gastrointestinal Bleeding: Please ask your Primary MD to check a complete blood count within one week of discharge or at your next visit. Your endoscopic/colonoscopic biopsies that are pending at the time of discharge, will also need to followed by your Primary MD.  Get Medicines reviewed and adjusted. Please take all your  medications with you for your next visit with your Primary MD  Please request your Primary MD to go over all hospital tests and procedure/radiological results at the follow up, please ask your Primary MD to get all Hospital records sent to his/her office.  If you experience worsening of your admission symptoms, develop shortness of breath, life threatening emergency, suicidal or homicidal thoughts you must seek medical attention immediately by calling 911 or calling your MD immediately  if symptoms less severe.  You must read complete instructions/literature along with all the possible adverse reactions/side effects for all the Medicines you take and that have been prescribed to you. Take any new Medicines after you have completely understood and accpet all the possible adverse reactions/side effects.   Do not drive or operate heavy machinery when taking Pain medications.   Do not take more than prescribed Pain, Sleep and Anxiety Medications  Special Instructions: If you have smoked or chewed Tobacco  in the last 2 yrs please stop smoking, stop any regular Alcohol  and or any Recreational drug use.  Wear Seat belts while driving.  Please note You were cared for by a hospitalist during your hospital stay. If you have any questions about your discharge medications or the care you received while you were in the hospital after you are discharged, you can call the unit and asked to speak with the hospitalist on call if the hospitalist that took care of you is not available. Once  you are discharged, your primary care physician will handle any further medical issues. Please note that NO REFILLS for any discharge medications will be authorized once you are discharged, as it is imperative that you return to your primary care physician (or establish a relationship with a primary care physician if you do not have one) for your aftercare needs so that they can reassess your need for medications and monitor your  lab values.  You can reach the hospitalist office at phone [phone] or fax [phone]   If you do not have a primary care physician, you can call [phone] for a physician referral.  Activity: As tolerated with Full fall precautions use walker/cane & assistance as needed  Diet: heart healthy  Disposition Home

## 2016-12-23 NOTE — Progress Notes (Signed)
Nsg Discharge Note  Admit Date:  12/20/2016 Discharge date: 12/23/2016   Justin Garner to be D/C'd Home per MD order.  AVS completed.  Copy for chart, and copy for patient signed, and dated. Patient/caregiver able to verbalize understanding.  Discharge Medication: Allergies as of 12/23/2016   No Known Allergies     Medication List    TAKE these medications   albuterol 108 (90 Base) MCG/ACT inhaler Commonly known as:  PROVENTIL HFA;VENTOLIN HFA Inhale 2 puffs into the lungs every 6 (six) hours as needed for wheezing or shortness of breath.   atorvastatin 20 MG tablet Commonly known as:  LIPITOR TAKE 1 TABLET BY MOUTH  DAILY What changed:  See the new instructions.   budesonide-formoterol 80-4.5 MCG/ACT inhaler Commonly known as:  SYMBICORT Inhale 2 puffs into the lungs every morning.   dextromethorphan-guaiFENesin 30-600 MG 12hr tablet Commonly known as:  MUCINEX DM Take 1 tablet by mouth 2 (two) times daily as needed for cough.   diltiazem 360 MG 24 hr capsule Commonly known as:  CARDIZEM CD Take 1 capsule (360 mg total) by mouth daily.   doxycycline 100 MG capsule Commonly known as:  VIBRAMYCIN Take 1 capsule (100 mg total) by mouth 2 (two) times daily.   DRY EYES OP Place 1 drop into both eyes as needed (dry eye).   fish oil-omega-3 fatty acids 1000 MG capsule Take 1 g by mouth daily.   furosemide 20 MG tablet Commonly known as:  LASIX Take 2 tablets (40 mg total) by mouth daily.   glipiZIDE 10 MG tablet Commonly known as:  GLUCOTROL Take 10 mg by mouth daily.   levothyroxine 112 MCG tablet Commonly known as:  SYNTHROID, LEVOTHROID Take 112 mcg by mouth daily.   lisinopril 5 MG tablet Commonly known as:  PRINIVIL,ZESTRIL Take 0.5 tablets (2.5 mg total) by mouth daily.   metoprolol tartrate 25 MG tablet Commonly known as:  LOPRESSOR Take 12.5 mg by mouth 2 (two) times daily. What changed:  Another medication with the same name was removed. Continue  taking this medication, and follow the directions you see here.   nitroGLYCERIN 0.4 MG SL tablet Commonly known as:  NITROSTAT Place 1 tablet (0.4 mg total) under the tongue every 5 (five) minutes as needed for chest pain up to 3 doses   omeprazole 20 MG capsule Commonly known as:  PRILOSEC Take 20 mg by mouth every other day.   potassium chloride 10 MEQ tablet Commonly known as:  K-DUR Take 2 tablets (20 mEq total) by mouth daily.   senna-docusate 8.6-50 MG tablet Commonly known as:  Senokot-S Take 1 tablet by mouth at bedtime as needed for mild constipation.   warfarin 5 MG tablet Commonly known as:  COUMADIN Take 5 mg by mouth See admin instructions. Take 5 mg on Sunday and Thursday Takes 7.5 mg on Mon. Tues. Wed. Fri. And sat.       Discharge Assessment: Vitals:   12/23/16 0809 12/23/16 0922  BP:  133/77  Pulse:  (!) 116  Resp:    Temp:    SpO2: 98%    Skin clean, dry and intact without evidence of skin break down, no evidence of skin tears noted. IV catheter discontinued intact. Site without signs and symptoms of complications - no redness or edema noted at insertion site, patient denies c/o pain - only slight tenderness at site.  Dressing with slight pressure applied.  D/c Instructions-Education: Discharge instructions given to patient/family with verbalized understanding. D/c  education completed with patient/family including follow up instructions, medication list, d/c activities limitations if indicated, with other d/c instructions as indicated by MD - patient able to verbalize understanding, all questions fully answered. Patient instructed to return to ED, call 911, or call MD for any changes in condition.  Patient escorted via [place], and D/C home via private auto.  Eliot Ford, RN 12/23/2016 11:51 AM

## 2016-12-26 LAB — CULTURE, BLOOD (ROUTINE X 2)
CULTURE: NO GROWTH
CULTURE: NO GROWTH
SPECIAL REQUESTS: ADEQUATE
Special Requests: ADEQUATE

## 2016-12-27 NOTE — Progress Notes (Addendum)
Cardiology Office Note   Date:  12/28/2016   ID:  Justin Garner, DOB April 29, 1932, MRN [id number]  PCP:  [name], MD    No chief complaint on file.  Chronic systolic heart failure  Wt Readings from Last 3 Encounters:  12/28/16 186 lb 12.8 oz (84.7 kg)  12/20/16 192 lb 3.9 oz (87.2 kg)  09/14/16 176 lb 1.9 oz (79.9 kg)       History of Present Illness: Justin Garner is a 83 y.o. male  has a h/o atrial fibrillation, on chronic coumadin, chronic systolic HF EF 13-24%, mild nonobstructive CAD by cath 08/2013, COPD and h/o right sided lung CA s/p resection and chemo + radiation. He is on chronic supplemental O2 at home.   In June 2018, he was admitted to the hospital for acute hypoxic respiratory failure in the setting of acute COPD exacerbation, acute on chronic systolic HF and atrial fibrillation w/ RVR. He was admitted by IM and placed on antibiotics and steroids. Also treated with IV lasix and transitioned back to PO after diuresis. Home lasix increased from 20 mg daily to 20 mg BID. He required increasing doses of rate control meds. Given his COPD, further titration of his BB was avoided. His Cardizem was increased from 240 mg to 360 mg. He was seen by EP, Dr. Lovena Le, who agreed with increasing dose to 360 mg. He was noticed to have a 2.5 sec pause during his admission however he was asymptomatic. He was continued on lisinopril 5 mg.   He was again hospitalized for pneumonia in 10/18.  He has recovered well.  He was treated with antibiotics.  Denies : Chest pain. Dizziness. Leg edema. Nitroglycerin use. Orthopnea. Palpitations. Paroxysmal nocturnal dyspnea.  Syncope.   She has chronic shortness of breath.      Past Medical History:  Diagnosis Date  . ASCVD (arteriosclerotic cardiovascular disease)   . Cancer [facility]) 2009   Upper lobectomy;radiation; chemotherapy  . Chronic systolic CHF (congestive heart failure) ([place])   . CKD (chronic kidney disease), stage II    . COPD (chronic obstructive pulmonary disease) ([place])   . Coronary artery disease   . Diabetes mellitus   . GERD (gastroesophageal reflux disease)   . Hyperlipidemia   . Hypertension   . Leukocytosis 07/12/2015  . PAF (paroxysmal atrial fibrillation) (HCC)    Chronic warfarin therapy  . Primary cancer of right upper lobe of lung ([place]) 06/30/2013   RUL lobectomy 2009 with chest wall resection for superior sulcus tumor Postoperative radiation and chemotherapy; Dr Earlie Server Oncology monitor [phone]  . Pulmonary embolism (HCC)    PMH of  . Thyroid disease     Past Surgical History:  Procedure Laterality Date  . CAROTID STENT    . CHOLECYSTECTOMY    . FIBEROPTIC BRONCHOSCOPY AND MEIASTINOSCOPY  06/07/2007  . HERNIA REPAIR    . INGUINAL HERNIA REPAIR  10/10/2010  . LEFT HEART CATHETERIZATION WITH CORONARY ANGIOGRAM N/A 08/19/2013   Procedure: LEFT HEART CATHETERIZATION WITH CORONARY ANGIOGRAM;  Surgeon: Jettie Booze, MD;  Location: [facility] CATH LAB;  Service: Cardiovascular;  Laterality: N/A;  . R VATS,THORACOTOMY AND UPPER LOBECTOMY  08/02/2007     Current Outpatient Prescriptions  Medication Sig Dispense Refill  . albuterol (PROVENTIL HFA;VENTOLIN HFA) 108 (90 Base) MCG/ACT inhaler Inhale 2 puffs into the lungs every 6 (six) hours as needed for wheezing or shortness of breath. 1 Inhaler 2  . Artificial Tear Ointment (DRY EYES OP) Place 1 drop  into both eyes as needed (dry eye).    Marland Kitchen atorvastatin (LIPITOR) 20 MG tablet Take 20 mg by mouth daily.    . budesonide-formoterol (SYMBICORT) 80-4.5 MCG/ACT inhaler Inhale 2 puffs into the lungs every morning. 1 Inhaler 2  . dextromethorphan-guaiFENesin (MUCINEX DM) 30-600 MG 12hr tablet Take 1 tablet by mouth 2 (two) times daily as needed for cough. 14 tablet 0  . diltiazem (CARDIZEM CD) 360 MG 24 hr capsule Take 1 capsule (360 mg total) by mouth daily. 90 capsule 3  . fish oil-omega-3 fatty acids 1000 MG capsule Take 1 g by mouth daily.       . furosemide (LASIX) 20 MG tablet Take 2 tablets (40 mg total) by mouth daily. 180 tablet 2  . glipiZIDE (GLUCOTROL XL) 10 MG 24 hr tablet Take 10 mg by mouth daily.    Marland Kitchen levothyroxine (SYNTHROID, LEVOTHROID) 112 MCG tablet Take 112 mcg by mouth daily.      Marland Kitchen lisinopril (PRINIVIL,ZESTRIL) 5 MG tablet Take 0.5 tablets (2.5 mg total) by mouth daily. 90 tablet 3  . metoprolol tartrate (LOPRESSOR) 25 MG tablet Take 12.5 mg by mouth 2 (two) times daily.     . nitroGLYCERIN (NITROSTAT) 0.4 MG SL tablet Place 1 tablet (0.4 mg total) under the tongue every 5 (five) minutes as needed for chest pain up to 3 doses 25 tablet 3  . omeprazole (PRILOSEC) 20 MG capsule Take 20 mg by mouth every other day.     . potassium chloride (K-DUR) 10 MEQ tablet Take 2 tablets (20 mEq total) by mouth daily. 10 tablet 0  . potassium chloride (K-DUR,KLOR-CON) 10 MEQ tablet Take 2 tablets by mouth daily.  0  . senna-docusate (SENOKOT-S) 8.6-50 MG tablet Take 1 tablet by mouth at bedtime as needed for mild constipation. 10 tablet 0  . warfarin (COUMADIN) 5 MG tablet Take 5 mg by mouth See admin instructions. Take 5 mg on Sunday and Thursday Takes 7.5 mg on Mon. Tues. Wed. Fri. And sat.     No current facility-administered medications for this visit.     Allergies:   Patient has no known allergies.    Social History:  The patient  reports that he quit smoking about 15 years ago. His smoking use included Cigarettes. He has a 50.00 pack-year smoking history. He has quit using smokeless tobacco. He reports that he does not drink alcohol or use drugs.   Family History:  The patient's family history includes Diabetes in his brother; Heart attack in his father; Heart disease in his father.    ROS:  Please see the history of present illness.   Otherwise, review of systems are positive for .   All other systems are reviewed and negative.    PHYSICAL EXAM: VS:  BP 126/70   Pulse 87   Ht 6\' 2"  (1.88 m)   Wt 186 lb 12.8 oz  (84.7 kg)   SpO2 97%   BMI 23.98 kg/m  , BMI Body mass index is 23.98 kg/m. GEN: Well nourished, well developed, in no acute distress  HEENT: normal  Neck: no JVD, carotid bruits, or masses Cardiac: irregularly irregular; no murmurs, rubs, or gallops,no edema  Respiratory:  clear to auscultation bilaterally, normal work of breathing GI: soft, nontender, nondistended, + BS MS: no deformity or atrophy  Skin: warm and dry, no rash Neuro:  Strength and sensation are intact Psych: euthymic mood, flat affect     Recent Labs: 08/18/2016: TSH 2.180 08/19/2016: ALT 27 08/20/2016:  Magnesium 1.6 12/20/2016: B Natriuretic Peptide 231.1 12/22/2016: BUN 18; Creatinine, Ser 1.22; Potassium 2.9; Sodium 137 12/23/2016: Hemoglobin 13.9; Platelets 210   Lipid Panel    Component Value Date/Time   CHOL 78 06/15/2015 1147   TRIG 98 06/15/2015 1147   HDL 15 (L) 06/15/2015 1147   CHOLHDL 5.2 06/15/2015 1147   VLDL 20 06/15/2015 1147   LDLCALC 43 06/15/2015 1147     Other studies Reviewed: Additional studies/ records that were reviewed today with results demonstrating: Hopsital records reviewed.  He was negative for the flu.   ASSESSMENT AND PLAN:  1. Chronic systolic heart failure: Appears euvolemic.  CONtinue current meds. On low dose ACE as he has needed more rate control meds. 2. AFib: rate controlled.  Avoiding beta blocker due to severe COPD.  Watch for lightheadedness given change in rate control meds.  He had a 2.5 sec pause in the hospital but no signs of significant bradycardia or pauses. 3. HTN:: Controlled on current meds.   4. CHeck coumadin today.   Current medicines are reviewed at length with the patient today.  The patient concerns regarding his medicines were addressed.  The following changes have been made:  No change  Labs/ tests ordered today include:  No orders of the defined types were placed in this encounter.   Recommend 150 minutes/week of aerobic exercise Low fat,  low carb, high fiber diet recommended  Disposition:   FU in 8 months   Signed, Larae Grooms, MD  12/28/2016 8:32 AM    [facility] [address], [place], [place]  [address] Phone: [phone]; Fax: [phone]

## 2016-12-28 ENCOUNTER — Ambulatory Visit (INDEPENDENT_AMBULATORY_CARE_PROVIDER_SITE_OTHER): Payer: Medicare Other | Admitting: *Deleted

## 2016-12-28 ENCOUNTER — Ambulatory Visit (INDEPENDENT_AMBULATORY_CARE_PROVIDER_SITE_OTHER): Payer: Medicare Other | Admitting: Interventional Cardiology

## 2016-12-28 ENCOUNTER — Encounter: Payer: Self-pay | Admitting: Interventional Cardiology

## 2016-12-28 VITALS — BP 126/70 | HR 87 | Ht 74.0 in | Wt 186.8 lb

## 2016-12-28 DIAGNOSIS — I482 Chronic atrial fibrillation, unspecified: Secondary | ICD-10-CM

## 2016-12-28 DIAGNOSIS — I10 Essential (primary) hypertension: Secondary | ICD-10-CM | POA: Diagnosis not present

## 2016-12-28 DIAGNOSIS — I5022 Chronic systolic (congestive) heart failure: Secondary | ICD-10-CM

## 2016-12-28 DIAGNOSIS — I481 Persistent atrial fibrillation: Secondary | ICD-10-CM | POA: Diagnosis not present

## 2016-12-28 DIAGNOSIS — I2699 Other pulmonary embolism without acute cor pulmonale: Secondary | ICD-10-CM

## 2016-12-28 DIAGNOSIS — I4819 Other persistent atrial fibrillation: Secondary | ICD-10-CM

## 2016-12-28 LAB — POCT INR: INR: 3.4

## 2016-12-28 NOTE — Patient Instructions (Signed)
Medication Instructions:  Your physician recommends that you continue on your current medications as directed. Please refer to the Current Medication list given to you today.   Labwork: None ordered  Testing/Procedures: None ordered  Follow-Up: Your physician wants you to follow-up in: June 2019 with Dr. Irish Lack. You will receive a reminder letter in the mail two months in advance. If you don't receive a letter, please call our office to schedule the follow-up appointment.   Any Other Special Instructions Will Be Listed Below (If Applicable).     If you need a refill on your cardiac medications before your next appointment, please call your pharmacy.

## 2017-01-02 ENCOUNTER — Other Ambulatory Visit: Payer: Self-pay | Admitting: Interventional Cardiology

## 2017-01-03 ENCOUNTER — Other Ambulatory Visit: Payer: Self-pay

## 2017-01-03 MED ORDER — LISINOPRIL 5 MG PO TABS: 2.5000 mg | ORAL_TABLET | Freq: Every day | ORAL | 1 refills | Status: DC

## 2017-01-03 MED ORDER — DILTIAZEM HCL ER COATED BEADS 360 MG PO CP24: 360.0000 mg | ORAL_CAPSULE | Freq: Every day | ORAL | 1 refills | Status: DC

## 2017-01-03 MED ORDER — METOPROLOL TARTRATE 25 MG PO TABS: 12.5000 mg | ORAL_TABLET | Freq: Two times a day (BID) | ORAL | 1 refills | Status: DC

## 2017-01-03 MED ORDER — FUROSEMIDE 20 MG PO TABS: 40.0000 mg | ORAL_TABLET | Freq: Every day | ORAL | 1 refills | Status: DC

## 2017-01-10 ENCOUNTER — Ambulatory Visit (INDEPENDENT_AMBULATORY_CARE_PROVIDER_SITE_OTHER): Payer: Medicare Other | Admitting: *Deleted

## 2017-01-10 ENCOUNTER — Other Ambulatory Visit: Payer: Self-pay | Admitting: Geriatric Medicine

## 2017-01-10 ENCOUNTER — Ambulatory Visit
Admission: RE | Admit: 2017-01-10 | Discharge: 2017-01-10 | Disposition: A | Discharge status: Home / Self Care | Payer: Medicare Other | Source: Ambulatory Visit | Attending: Geriatric Medicine | Admitting: Geriatric Medicine

## 2017-01-10 DIAGNOSIS — M542 Cervicalgia: Secondary | ICD-10-CM

## 2017-01-10 DIAGNOSIS — Z5181 Encounter for therapeutic drug level monitoring: Secondary | ICD-10-CM

## 2017-01-10 DIAGNOSIS — I2699 Other pulmonary embolism without acute cor pulmonale: Secondary | ICD-10-CM | POA: Diagnosis not present

## 2017-01-10 DIAGNOSIS — I482 Chronic atrial fibrillation, unspecified: Secondary | ICD-10-CM

## 2017-01-10 LAB — POCT INR: INR: 5.2

## 2017-01-19 ENCOUNTER — Ambulatory Visit (INDEPENDENT_AMBULATORY_CARE_PROVIDER_SITE_OTHER): Payer: Medicare Other | Admitting: *Deleted

## 2017-01-19 DIAGNOSIS — I482 Chronic atrial fibrillation, unspecified: Secondary | ICD-10-CM

## 2017-01-19 DIAGNOSIS — I2699 Other pulmonary embolism without acute cor pulmonale: Secondary | ICD-10-CM | POA: Diagnosis not present

## 2017-01-19 DIAGNOSIS — Z5181 Encounter for therapeutic drug level monitoring: Secondary | ICD-10-CM | POA: Diagnosis not present

## 2017-01-19 LAB — POCT INR: INR: 2

## 2017-02-02 ENCOUNTER — Ambulatory Visit (INDEPENDENT_AMBULATORY_CARE_PROVIDER_SITE_OTHER): Payer: Medicare Other | Admitting: *Deleted

## 2017-02-02 DIAGNOSIS — I2699 Other pulmonary embolism without acute cor pulmonale: Secondary | ICD-10-CM

## 2017-02-02 DIAGNOSIS — Z5181 Encounter for therapeutic drug level monitoring: Secondary | ICD-10-CM | POA: Diagnosis not present

## 2017-02-02 DIAGNOSIS — I482 Chronic atrial fibrillation, unspecified: Secondary | ICD-10-CM

## 2017-02-02 LAB — POCT INR: INR: 1.9

## 2017-02-02 NOTE — Patient Instructions (Signed)
Today take 1.5 tablets then continue taking 1 tablet daily except 1.5 tablets on Tuesdays, Thursdays, and Saturdays. Recheck in 2 weeks.  Call our office if placed on any new medications or if you have any questions #[phone]- [phone].

## 2017-02-16 ENCOUNTER — Ambulatory Visit (INDEPENDENT_AMBULATORY_CARE_PROVIDER_SITE_OTHER): Payer: Medicare Other | Admitting: *Deleted

## 2017-02-16 DIAGNOSIS — I482 Chronic atrial fibrillation, unspecified: Secondary | ICD-10-CM

## 2017-02-16 DIAGNOSIS — Z5181 Encounter for therapeutic drug level monitoring: Secondary | ICD-10-CM | POA: Diagnosis not present

## 2017-02-16 DIAGNOSIS — I2699 Other pulmonary embolism without acute cor pulmonale: Secondary | ICD-10-CM | POA: Diagnosis not present

## 2017-02-16 LAB — POCT INR: INR: 2.2

## 2017-02-16 NOTE — Patient Instructions (Signed)
Continue taking 1 tablet daily except 1.5 tablets on Tuesdays, Thursdays, and Saturdays. Recheck in 3 weeks.  Call our office if placed on any new medications or if you have any questions #[phone]- [phone].

## 2017-03-09 ENCOUNTER — Ambulatory Visit (INDEPENDENT_AMBULATORY_CARE_PROVIDER_SITE_OTHER): Payer: Medicare Other | Admitting: Pharmacist

## 2017-03-09 DIAGNOSIS — I482 Chronic atrial fibrillation, unspecified: Secondary | ICD-10-CM

## 2017-03-09 LAB — PROTIME-INR
INR: 6.4 — AB (ref 0.8–1.2)
PROTHROMBIN TIME: 67.8 s — AB (ref 9.1–12.0)

## 2017-03-09 LAB — POCT INR: INR: 6.5

## 2017-03-09 NOTE — Patient Instructions (Addendum)
  Description   Stat INR back at 6.4. Called pt and spoke with his grandson - advised pt to hold his Coumadin for 3 days, take 1/2 tablet on Monday, then resume usual dose of 1 tablet daily except 1.5 tablets on Tuesdays, Thursdays, and Saturdays. Recheck INR in 1 week.

## 2017-03-16 ENCOUNTER — Ambulatory Visit (INDEPENDENT_AMBULATORY_CARE_PROVIDER_SITE_OTHER): Payer: Medicare Other | Admitting: *Deleted

## 2017-03-16 DIAGNOSIS — I482 Chronic atrial fibrillation, unspecified: Secondary | ICD-10-CM

## 2017-03-16 DIAGNOSIS — Z5181 Encounter for therapeutic drug level monitoring: Secondary | ICD-10-CM | POA: Diagnosis not present

## 2017-03-16 LAB — POCT INR: INR: 1.6

## 2017-03-16 NOTE — Patient Instructions (Signed)
Description   Continue taking  1 tablet daily except 1.5 tablets on Tuesdays, Thursdays, and Saturdays. Recheck INR in 10 days. Call us with any medication changes or concerns # [phone] [facility] # [phone].

## 2017-03-22 ENCOUNTER — Other Ambulatory Visit: Payer: Self-pay | Admitting: Interventional Cardiology

## 2017-03-22 MED ORDER — DILTIAZEM HCL ER COATED BEADS 360 MG PO CP24: 360.0000 mg | ORAL_CAPSULE | Freq: Every day | ORAL | 2 refills | Status: DC

## 2017-03-26 ENCOUNTER — Ambulatory Visit (INDEPENDENT_AMBULATORY_CARE_PROVIDER_SITE_OTHER): Payer: Medicare Other

## 2017-03-26 DIAGNOSIS — I482 Chronic atrial fibrillation, unspecified: Secondary | ICD-10-CM

## 2017-03-26 LAB — POCT INR: INR: 2.1

## 2017-03-26 NOTE — Patient Instructions (Signed)
Continue taking 1 tablet daily except 1.5 tablets on Tuesdays, Thursdays, and Saturdays. Recheck INR in 3 weeks. Call us with any medication changes or concerns # [phone] [facility] # [phone].

## 2017-04-17 ENCOUNTER — Ambulatory Visit (INDEPENDENT_AMBULATORY_CARE_PROVIDER_SITE_OTHER): Payer: Medicare Other | Admitting: *Deleted

## 2017-04-17 DIAGNOSIS — I4891 Unspecified atrial fibrillation: Secondary | ICD-10-CM

## 2017-04-17 DIAGNOSIS — I482 Chronic atrial fibrillation, unspecified: Secondary | ICD-10-CM

## 2017-04-17 DIAGNOSIS — Z5181 Encounter for therapeutic drug level monitoring: Secondary | ICD-10-CM

## 2017-04-17 LAB — POCT INR: INR: 2.2

## 2017-04-17 NOTE — Patient Instructions (Signed)
Description   Continue taking 1 tablet daily except 1.5 tablets on Tuesdays, Thursdays, and Saturdays. Recheck INR in 4 weeks. Call us with any medication changes or concerns # [phone] [facility] # [phone].

## 2017-04-26 ENCOUNTER — Inpatient Hospital Stay (HOSPITAL_COMMUNITY)
Admission: EM | Admit: 2017-04-26 | Discharge: 2017-04-29 | DRG: 194 | Disposition: A | Discharge status: Home Health | Payer: Medicare Other | Attending: Family Medicine | Admitting: Family Medicine

## 2017-04-26 ENCOUNTER — Observation Stay (HOSPITAL_COMMUNITY): Payer: Medicare Other

## 2017-04-26 ENCOUNTER — Encounter (HOSPITAL_COMMUNITY): Payer: Self-pay | Admitting: *Deleted

## 2017-04-26 ENCOUNTER — Other Ambulatory Visit: Payer: Self-pay

## 2017-04-26 ENCOUNTER — Emergency Department (HOSPITAL_COMMUNITY): Payer: Medicare Other

## 2017-04-26 DIAGNOSIS — I2510 Atherosclerotic heart disease of native coronary artery without angina pectoris: Secondary | ICD-10-CM | POA: Diagnosis present

## 2017-04-26 DIAGNOSIS — J189 Pneumonia, unspecified organism: Principal | ICD-10-CM | POA: Diagnosis present

## 2017-04-26 DIAGNOSIS — R042 Hemoptysis: Secondary | ICD-10-CM

## 2017-04-26 DIAGNOSIS — N182 Chronic kidney disease, stage 2 (mild): Secondary | ICD-10-CM | POA: Diagnosis present

## 2017-04-26 DIAGNOSIS — J181 Lobar pneumonia, unspecified organism: Secondary | ICD-10-CM | POA: Diagnosis not present

## 2017-04-26 DIAGNOSIS — Z7984 Long term (current) use of oral hypoglycemic drugs: Secondary | ICD-10-CM

## 2017-04-26 DIAGNOSIS — E1122 Type 2 diabetes mellitus with diabetic chronic kidney disease: Secondary | ICD-10-CM | POA: Diagnosis present

## 2017-04-26 DIAGNOSIS — K219 Gastro-esophageal reflux disease without esophagitis: Secondary | ICD-10-CM | POA: Diagnosis present

## 2017-04-26 DIAGNOSIS — J440 Chronic obstructive pulmonary disease with acute lower respiratory infection: Secondary | ICD-10-CM | POA: Diagnosis present

## 2017-04-26 DIAGNOSIS — Z923 Personal history of irradiation: Secondary | ICD-10-CM

## 2017-04-26 DIAGNOSIS — Z87891 Personal history of nicotine dependence: Secondary | ICD-10-CM

## 2017-04-26 DIAGNOSIS — E039 Hypothyroidism, unspecified: Secondary | ICD-10-CM | POA: Diagnosis present

## 2017-04-26 DIAGNOSIS — Z9221 Personal history of antineoplastic chemotherapy: Secondary | ICD-10-CM

## 2017-04-26 DIAGNOSIS — D72829 Elevated white blood cell count, unspecified: Secondary | ICD-10-CM | POA: Diagnosis present

## 2017-04-26 DIAGNOSIS — Z79890 Hormone replacement therapy (postmenopausal): Secondary | ICD-10-CM

## 2017-04-26 DIAGNOSIS — I5022 Chronic systolic (congestive) heart failure: Secondary | ICD-10-CM | POA: Diagnosis present

## 2017-04-26 DIAGNOSIS — E785 Hyperlipidemia, unspecified: Secondary | ICD-10-CM | POA: Diagnosis present

## 2017-04-26 DIAGNOSIS — I482 Chronic atrial fibrillation, unspecified: Secondary | ICD-10-CM | POA: Diagnosis present

## 2017-04-26 DIAGNOSIS — Z7901 Long term (current) use of anticoagulants: Secondary | ICD-10-CM

## 2017-04-26 DIAGNOSIS — N179 Acute kidney failure, unspecified: Secondary | ICD-10-CM | POA: Diagnosis present

## 2017-04-26 DIAGNOSIS — C3411 Malignant neoplasm of upper lobe, right bronchus or lung: Secondary | ICD-10-CM | POA: Diagnosis present

## 2017-04-26 DIAGNOSIS — Z833 Family history of diabetes mellitus: Secondary | ICD-10-CM

## 2017-04-26 DIAGNOSIS — Z85118 Personal history of other malignant neoplasm of bronchus and lung: Secondary | ICD-10-CM

## 2017-04-26 DIAGNOSIS — Z7951 Long term (current) use of inhaled steroids: Secondary | ICD-10-CM

## 2017-04-26 DIAGNOSIS — Z9049 Acquired absence of other specified parts of digestive tract: Secondary | ICD-10-CM

## 2017-04-26 DIAGNOSIS — I130 Hypertensive heart and chronic kidney disease with heart failure and stage 1 through stage 4 chronic kidney disease, or unspecified chronic kidney disease: Secondary | ICD-10-CM | POA: Diagnosis present

## 2017-04-26 DIAGNOSIS — Z79899 Other long term (current) drug therapy: Secondary | ICD-10-CM

## 2017-04-26 DIAGNOSIS — I10 Essential (primary) hypertension: Secondary | ICD-10-CM | POA: Diagnosis present

## 2017-04-26 DIAGNOSIS — E876 Hypokalemia: Secondary | ICD-10-CM | POA: Diagnosis present

## 2017-04-26 DIAGNOSIS — I480 Paroxysmal atrial fibrillation: Secondary | ICD-10-CM | POA: Diagnosis present

## 2017-04-26 DIAGNOSIS — Z86711 Personal history of pulmonary embolism: Secondary | ICD-10-CM

## 2017-04-26 DIAGNOSIS — J209 Acute bronchitis, unspecified: Secondary | ICD-10-CM | POA: Diagnosis present

## 2017-04-26 DIAGNOSIS — Z902 Acquired absence of lung [part of]: Secondary | ICD-10-CM

## 2017-04-26 DIAGNOSIS — Z8249 Family history of ischemic heart disease and other diseases of the circulatory system: Secondary | ICD-10-CM

## 2017-04-26 DIAGNOSIS — E860 Dehydration: Secondary | ICD-10-CM | POA: Diagnosis present

## 2017-04-26 LAB — CBG MONITORING, ED: Glucose-Capillary: 92 mg/dL (ref 65–99)

## 2017-04-26 LAB — BASIC METABOLIC PANEL
Anion gap: 14 (ref 5–15)
BUN: 30 mg/dL — AB (ref 6–20)
CO2: 22 mmol/L (ref 22–32)
CREATININE: 1.62 mg/dL — AB (ref 0.61–1.24)
Calcium: 8.6 mg/dL — ABNORMAL LOW (ref 8.9–10.3)
Chloride: 103 mmol/L (ref 101–111)
GFR calc Af Amer: 44 mL/min — ABNORMAL LOW (ref >60)
GFR calc non Af Amer: 38 mL/min — ABNORMAL LOW (ref >60)
Glucose, Bld: 117 mg/dL — ABNORMAL HIGH (ref 65–99)
Potassium: 3 mmol/L — ABNORMAL LOW (ref 3.5–5.1)
SODIUM: 139 mmol/L (ref 135–145)

## 2017-04-26 LAB — CBC
HCT: 47.5 % (ref 39.0–52.0)
Hemoglobin: 16.3 g/dL (ref 13.0–17.0)
MCH: 31.8 pg (ref 26.0–34.0)
MCHC: 34.3 g/dL (ref 30.0–36.0)
MCV: 92.6 fL (ref 78.0–100.0)
PLATELETS: 228 10*3/uL (ref 150–400)
RBC: 5.13 MIL/uL (ref 4.22–5.81)
RDW: 15.6 % — AB (ref 11.5–15.5)
WBC: 16.3 10*3/uL — AB (ref 4.0–10.5)

## 2017-04-26 LAB — I-STAT TROPONIN, ED: Troponin i, poc: 0.03 ng/mL (ref 0.00–0.08)

## 2017-04-26 LAB — I-STAT CG4 LACTIC ACID, ED: LACTIC ACID, VENOUS: 1.53 mmol/L (ref 0.5–1.9)

## 2017-04-26 LAB — PROTIME-INR
INR: 2.12
PROTHROMBIN TIME: 23.6 s — AB (ref 11.4–15.2)

## 2017-04-26 MED ORDER — METOPROLOL TARTRATE 12.5 MG HALF TABLET
12.5000 mg | ORAL_TABLET | Freq: Two times a day (BID) | ORAL | Status: DC
  Administered 2017-04-26 – 2017-04-29 (×6): 12.5 mg via ORAL
  Filled 2017-04-26 (×7): qty 1

## 2017-04-26 MED ORDER — METOPROLOL TARTRATE 5 MG/5ML IV SOLN
5.0000 mg | INTRAVENOUS | Status: DC | PRN
Start: 2017-04-26 — End: 2017-04-29

## 2017-04-26 MED ORDER — SODIUM CHLORIDE 0.9 % IV BOLUS (SEPSIS)
500.0000 mL | Freq: Once | INTRAVENOUS | Status: AC
  Administered 2017-04-26: 500 mL via INTRAVENOUS

## 2017-04-26 MED ORDER — DEXTROSE 5 % IV SOLN
500.0000 mg | INTRAVENOUS | Status: DC
  Administered 2017-04-27 – 2017-04-28 (×2): 500 mg via INTRAVENOUS
  Filled 2017-04-26 (×3): qty 500

## 2017-04-26 MED ORDER — HYDROCODONE-ACETAMINOPHEN 5-325 MG PO TABS: 1.0000 | ORAL_TABLET | ORAL | Status: DC | PRN

## 2017-04-26 MED ORDER — ACETAMINOPHEN 325 MG PO TABS: 650.0000 mg | ORAL_TABLET | Freq: Four times a day (QID) | ORAL | Status: DC | PRN

## 2017-04-26 MED ORDER — POTASSIUM CHLORIDE CRYS ER 20 MEQ PO TBCR
40.0000 meq | EXTENDED_RELEASE_TABLET | Freq: Once | ORAL | Status: AC
  Administered 2017-04-26: 40 meq via ORAL
  Filled 2017-04-26: qty 2

## 2017-04-26 MED ORDER — DEXTROSE 5 % IV SOLN
1.0000 g | Freq: Once | INTRAVENOUS | Status: AC
  Administered 2017-04-26: 1 g via INTRAVENOUS
  Filled 2017-04-26: qty 10

## 2017-04-26 MED ORDER — NITROGLYCERIN 0.4 MG SL SUBL: 0.4000 mg | SUBLINGUAL_TABLET | SUBLINGUAL | Status: DC | PRN

## 2017-04-26 MED ORDER — OMEGA-3-ACID ETHYL ESTERS 1 G PO CAPS
1.0000 g | ORAL_CAPSULE | Freq: Every day | ORAL | Status: DC
  Administered 2017-04-26 – 2017-04-29 (×4): 1 g via ORAL
  Filled 2017-04-26 (×4): qty 1

## 2017-04-26 MED ORDER — POLYETHYLENE GLYCOL 3350 17 G PO PACK: 17.0000 g | PACK | Freq: Every day | ORAL | Status: DC | PRN

## 2017-04-26 MED ORDER — DM-GUAIFENESIN ER 30-600 MG PO TB12: 1.0000 | ORAL_TABLET | Freq: Two times a day (BID) | ORAL | Status: DC | PRN

## 2017-04-26 MED ORDER — PANTOPRAZOLE SODIUM 40 MG PO TBEC
40.0000 mg | DELAYED_RELEASE_TABLET | Freq: Every day | ORAL | Status: DC
  Administered 2017-04-26 – 2017-04-29 (×4): 40 mg via ORAL
  Filled 2017-04-26 (×4): qty 1

## 2017-04-26 MED ORDER — DEXTROSE 5 % IV SOLN
500.0000 mg | Freq: Once | INTRAVENOUS | Status: AC
  Administered 2017-04-26: 500 mg via INTRAVENOUS
  Filled 2017-04-26: qty 500

## 2017-04-26 MED ORDER — LEVOTHYROXINE SODIUM 112 MCG PO TABS: 112.0000 ug | ORAL_TABLET | Freq: Every day | ORAL | Status: DC

## 2017-04-26 MED ORDER — DILTIAZEM HCL ER COATED BEADS 360 MG PO CP24: 360.0000 mg | ORAL_CAPSULE | Freq: Every day | ORAL | Status: DC

## 2017-04-26 MED ORDER — DILTIAZEM HCL ER COATED BEADS 180 MG PO CP24
360.0000 mg | ORAL_CAPSULE | Freq: Every day | ORAL | Status: DC
  Administered 2017-04-27 – 2017-04-29 (×3): 360 mg via ORAL
  Filled 2017-04-26 (×3): qty 2

## 2017-04-26 MED ORDER — IOPAMIDOL (ISOVUE-370) INJECTION 76%
INTRAVENOUS | Status: AC
  Administered 2017-04-26: 80 mL
  Filled 2017-04-26: qty 100

## 2017-04-26 MED ORDER — MOMETASONE FURO-FORMOTEROL FUM 100-5 MCG/ACT IN AERO
2.0000 | INHALATION_SPRAY | Freq: Two times a day (BID) | RESPIRATORY_TRACT | Status: DC
Start: 2017-04-26 — End: 2017-04-29
  Administered 2017-04-27 – 2017-04-29 (×5): 2 via RESPIRATORY_TRACT
  Filled 2017-04-26: qty 8.8

## 2017-04-26 MED ORDER — DEXTROSE 5 % IV SOLN
1.0000 g | INTRAVENOUS | Status: DC
  Administered 2017-04-27 – 2017-04-28 (×2): 1 g via INTRAVENOUS
  Filled 2017-04-26 (×3): qty 10

## 2017-04-26 MED ORDER — ONDANSETRON HCL 4 MG/2ML IJ SOLN: 4.0000 mg | Freq: Four times a day (QID) | INTRAMUSCULAR | Status: DC | PRN

## 2017-04-26 MED ORDER — LEVOTHYROXINE SODIUM 112 MCG PO TABS
112.0000 ug | ORAL_TABLET | Freq: Every day | ORAL | Status: DC
  Administered 2017-04-27 – 2017-04-29 (×3): 112 ug via ORAL
  Filled 2017-04-26 (×3): qty 1

## 2017-04-26 MED ORDER — ATORVASTATIN CALCIUM 20 MG PO TABS
20.0000 mg | ORAL_TABLET | Freq: Every day | ORAL | Status: DC
  Administered 2017-04-27 – 2017-04-29 (×3): 20 mg via ORAL
  Filled 2017-04-26 (×2): qty 1
  Filled 2017-04-26: qty 2
  Filled 2017-04-26: qty 1
  Filled 2017-04-26 (×2): qty 2

## 2017-04-26 MED ORDER — ALBUTEROL SULFATE (2.5 MG/3ML) 0.083% IN NEBU: 2.5000 mg | INHALATION_SOLUTION | Freq: Four times a day (QID) | RESPIRATORY_TRACT | Status: DC | PRN

## 2017-04-26 MED ORDER — SENNOSIDES-DOCUSATE SODIUM 8.6-50 MG PO TABS: 1.0000 | ORAL_TABLET | Freq: Every evening | ORAL | Status: DC | PRN

## 2017-04-26 NOTE — ED Notes (Signed)
Pt provided with [place] sandwich and diet ginger ale at this time

## 2017-04-26 NOTE — ED Notes (Signed)
Pt's CBG result was 92. Informed Megan - RN.

## 2017-04-26 NOTE — ED Triage Notes (Signed)
Pt is coughing up blood and reports it is thick.  Pt states his stomach was hurting this am and is sure he did not vomit the blood up.

## 2017-04-26 NOTE — H&P (Signed)
History and Physical    Justin Garner NLG:[id number] DOB: [birth year] DOA: 04/26/2017  PCP: [name], MD   Patient coming from: Home   Chief Complaint: Hemoptysis  HPI: Justin Garner is a 84 y.o. male with medical history significant of paroxysmal atrial fibrillation on warfarin, COPD recently weaned off home oxygen, right upper lobe lung cancer status post neoadjuvant chemoradiation status post resection in 2009, remote history of pulmonary embolism, hyperlipidemia, diabetes on oral hypoglycemics, hypertension who comes in with 3 days of hemoptysis.  Patient reports that he began having intermittent episodes of both large and small volume hemoptysis starting yesterday.  He could not feel which part of the lung the blood came from.  He denies any hematemesis however reports increasing cough during this time.  He does not remember his last INR check.  He reports the hemoptysis was a relatively small small volume.  He did have intermittent coughing and shortness of breath in between.  He had not had any wheezing and has not used any of his inhalers during this time.  He has not had any fevers, chest pain, lower extremity swelling that is new, recent travel, nausea, vomiting, diarrhea, abdominal pain.  He has never had hemoptysis before.  ED Course: On admission ED vitals were only notable for mild tachycardia.  This was intermittent and consistent with A. fib with RVR.  White count was 16.3 however patient has chronic leukocytosis of unclear etiology.  Creatinine was 1.6 with an elevated BUN consistent with mild AK I patient was also mildly hypokalemic.  Chest x-ray showed developing infiltrates in the left lung.  Patient was admitted for hemoptysis and pneumonia.  CTA was ordered.  INR was 2.0.  Review of Systems: As per HPI otherwise 10 point review of systems negative.   Past Medical History:  Diagnosis Date  . ASCVD (arteriosclerotic cardiovascular disease)   . Cancer [facility]) 2009   Upper lobectomy;radiation; chemotherapy  . Chronic systolic CHF (congestive heart failure) ([place])   . CKD (chronic kidney disease), stage II   . COPD (chronic obstructive pulmonary disease) ([place])   . Coronary artery disease   . Diabetes mellitus   . GERD (gastroesophageal reflux disease)   . Hyperlipidemia   . Hypertension   . Leukocytosis 07/12/2015  . PAF (paroxysmal atrial fibrillation) (HCC)    Chronic warfarin therapy  . Primary cancer of right upper lobe of lung ([place]) 06/30/2013   RUL lobectomy 2009 with chest wall resection for superior sulcus tumor Postoperative radiation and chemotherapy; Dr Earlie Server Oncology monitor [phone]  . Pulmonary embolism (HCC)    PMH of  . Thyroid disease     Past Surgical History:  Procedure Laterality Date  . CAROTID STENT    . CHOLECYSTECTOMY    . FIBEROPTIC BRONCHOSCOPY AND MEIASTINOSCOPY  06/07/2007  . HERNIA REPAIR    . INGUINAL HERNIA REPAIR  10/10/2010  . LEFT HEART CATHETERIZATION WITH CORONARY ANGIOGRAM N/A 08/19/2013   Procedure: LEFT HEART CATHETERIZATION WITH CORONARY ANGIOGRAM;  Surgeon: Jettie Booze, MD;  Location: [facility] CATH LAB;  Service: Cardiovascular;  Laterality: N/A;  . R VATS,THORACOTOMY AND UPPER LOBECTOMY  08/02/2007     reports that he quit smoking about 16 years ago. His smoking use included cigarettes. He has a 50.00 pack-year smoking history. He has quit using smokeless tobacco. He reports that he does not drink alcohol or use drugs.  No Known Allergies  Family History  Problem Relation Age of Onset  . Heart disease Father  Heart attack  . Heart attack Father   . Diabetes Brother   . Cancer Neg Hx   . Stroke Neg Hx   . Hypertension Neg Hx     Prior to Admission medications   Medication Sig Start Date End Date Taking? Authorizing Provider  albuterol (PROVENTIL HFA;VENTOLIN HFA) 108 (90 Base) MCG/ACT inhaler Inhale 2 puffs into the lungs every 6 (six) hours as needed for wheezing or shortness of  breath. 11/16/15   Cristal Ford, DO  Artificial Tear Ointment (DRY EYES OP) Place 1 drop into both eyes as needed (dry eye).    [provider]  atorvastatin (LIPITOR) 20 MG tablet Take 20 mg by mouth daily.    [provider]  budesonide-formoterol (SYMBICORT) 80-4.5 MCG/ACT inhaler Inhale 2 puffs into the lungs every morning. 11/16/15   Cristal Ford, DO  dextromethorphan-guaiFENesin [facility] DM) 30-600 MG 12hr tablet Take 1 tablet by mouth 2 (two) times daily as needed for cough. 11/16/15   Mikhail, Velta Addison, DO  diltiazem (CARDIZEM CD) 360 MG 24 hr capsule Take 1 capsule (360 mg total) by mouth daily. 03/22/17   Jettie Booze, MD  fish oil-omega-3 fatty acids 1000 MG capsule Take 1 g by mouth daily.     [provider]  furosemide (LASIX) 20 MG tablet Take 2 tablets (40 mg total) by mouth daily. 01/03/17   Jettie Booze, MD  glipiZIDE (GLUCOTROL XL) 10 MG 24 hr tablet Take 10 mg by mouth daily. 11/17/16   [provider]  levothyroxine (SYNTHROID, LEVOTHROID) 112 MCG tablet Take 112 mcg by mouth daily.      [provider]  lisinopril (PRINIVIL,ZESTRIL) 5 MG tablet Take 0.5 tablets (2.5 mg total) by mouth daily. 01/03/17   Jettie Booze, MD  metoprolol tartrate (LOPRESSOR) 25 MG tablet Take 0.5 tablets (12.5 mg total) by mouth 2 (two) times daily. 01/03/17   Jettie Booze, MD  nitroGLYCERIN (NITROSTAT) 0.4 MG SL tablet Place 1 tablet (0.4 mg total) under the tongue every 5 (five) minutes as needed for chest pain up to 3 doses 08/03/16   Jettie Booze, MD  omeprazole (PRILOSEC) 20 MG capsule Take 20 mg by mouth every other day.     [provider]  potassium chloride (K-DUR) 10 MEQ tablet Take 2 tablets (20 mEq total) by mouth daily. 12/23/16 12/28/16  Caren Griffins, MD  senna-docusate (SENOKOT-S) 8.6-50 MG tablet Take 1 tablet by mouth at bedtime as needed for mild constipation. 08/21/16   Dhungel, Flonnie Overman, MD   warfarin (COUMADIN) 5 MG tablet TAKE AS DIRECTED BY  COUMADIN CLINIC 01/02/17   Jettie Booze, MD    Physical Exam: Vitals:   04/26/17 1017 04/26/17 1039 04/26/17 1044  BP: (!) 137/118 106/76   Pulse: 75  (!) 120  Resp: 18    Temp: 98.3 F (36.8 C)    TempSrc: Oral    SpO2: 98%    Weight: 78.9 kg (174 lb)    Height: 6\' 2"  (1.88 m)      Constitutional: NAD, calm, comfortable Vitals:   04/26/17 1017 04/26/17 1039 04/26/17 1044  BP: (!) 137/118 106/76   Pulse: 75  (!) 120  Resp: 18    Temp: 98.3 F (36.8 C)    TempSrc: Oral    SpO2: 98%    Weight: 78.9 kg (174 lb)    Height: 6\' 2"  (1.88 m)     Eyes: Anicteric sclera ENMT: Poor dentition Neck: normal, supple, no masses  Respiratory: Normal work of breathing, no accessory muscle use, scattered rhonchi worse on the left Cardiovascular: Irregularly irregular, no murmurs rubs or gallops Abdomen: no tenderness, no masses palpated. No hepatosplenomegaly. Bowel sounds positive.  Musculoskeletal: n trace lower extremity edema Skin: no rashes or petechiae noted Neurologic: Grossly intact, moving all extremities independently Psychiatric: Normal judgment and insight. Alert and oriented x 3. Normal mood.   Labs on Admission: I have personally reviewed following labs and imaging studies  CBC: Recent Labs  Lab 04/26/17 1040  WBC 16.3*  HGB 16.3  HCT 47.5  MCV 92.6  PLT 332   Basic Metabolic Panel: Recent Labs  Lab 04/26/17 1040  NA 139  K 3.0*  CL 103  CO2 22  GLUCOSE 117*  BUN 30*  CREATININE 1.62*  CALCIUM 8.6*   GFR: Estimated Creatinine Clearance: 39.2 mL/min (A) (by C-G formula based on SCr of 1.62 mg/dL (H)). Liver Function Tests: No results for input(s): AST, ALT, ALKPHOS, BILITOT, PROT, ALBUMIN in the last 168 hours. No results for input(s): LIPASE, AMYLASE in the last 168 hours. No results for input(s): AMMONIA in the last 168 hours. Coagulation Profile: Recent Labs  Lab 04/26/17 1040  INR  2.12   Cardiac Enzymes: No results for input(s): CKTOTAL, CKMB, CKMBINDEX, TROPONINI in the last 168 hours. BNP (last 3 results) No results for input(s): PROBNP in the last 8760 hours. HbA1C: No results for input(s): HGBA1C in the last 72 hours. CBG: Recent Labs  Lab 04/26/17 1307  GLUCAP 92   Lipid Profile: No results for input(s): CHOL, HDL, LDLCALC, TRIG, CHOLHDL, LDLDIRECT in the last 72 hours. Thyroid Function Tests: No results for input(s): TSH, T4TOTAL, FREET4, T3FREE, THYROIDAB in the last 72 hours. Anemia Panel: No results for input(s): VITAMINB12, FOLATE, FERRITIN, TIBC, IRON, RETICCTPCT in the last 72 hours. Urine analysis:    Component Value Date/Time   COLORURINE YELLOW 08/18/2016 [place] 08/18/2016 1509   LABSPEC 1.017 08/18/2016 1509   PHURINE 5.0 08/18/2016 [place] 08/18/2016 1509   HGBUR NEGATIVE 08/18/2016 1509   BILIRUBINUR NEGATIVE 08/18/2016 1509   KETONESUR NEGATIVE 08/18/2016 1509   PROTEINUR 30 (A) 08/18/2016 1509   UROBILINOGEN 4.0 (H) 12/27/2013 2225   NITRITE NEGATIVE 08/18/2016 1509   LEUKOCYTESUR NEGATIVE 08/18/2016 1509    Radiological Exams on Admission: Dg Chest 2 View  Result Date: 04/26/2017 CLINICAL DATA:  Shortness of breath, hemoptysis EXAM: CHEST  2 VIEW COMPARISON:  12/20/2016 FINDINGS: Emphysema. Bullous changes in the left apex. Scarring in the right base with blunting of the right costophrenic angle likely reflecting pleural thickening. Increased densities in the mid and left lower lung concerning for pneumonia. Heart is borderline in size. IMPRESSION: Bullous emphysema, chronic changes. Increasing density in the mid and lower lung on the left concerning for pneumonia. Electronically Signed   By: Rolm Baptise M.D.   On: 04/26/2017 11:15    EKG: Independently reviewed.  Atrial fibrillation with RVR, no acute ST segment changes, unchanged from prior  Assessment/Plan Active Problems:   COPD with  acute bronchitis (HCC)   Chronic atrial fibrillation (HCC)   Essential hypertension, benign   Primary cancer of right upper lobe of lung (HCC)   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 (HCC)   CAP (community acquired pneumonia)   Chronic systolic CHF (congestive heart failure) (HCC)   Hypothyroidism   Leukocytosis   Hemoptysis   ##) Hemoptysis: Differential diagnosis of this is extensive.  Suspect reviewing CT  scan from approximately 2 year ago the patient has bronchiectasis likely traction bronchiectasis due to severe emphysematous lung disease.  This is the most likely source of his hemoptysis.  Other causes could be this developing pneumonia on the left side.  Unfortunately at this time because of his age and medical comorbidities he would not be a candidate for coil embolism from IR.  Furthermore his hemoptysis is stopped without any treatment.  It was likely exacerbated by his coagulopathic state that was iatrogenic. -We will obtain CTA -Hold warfarin, patient is therapeutic -Continue ceftriaxone and azithromycin for treatment of community acquired pneumonia -Follow-up blood cultures -We will consider consulting pulmonology if patient has persistent hemoptysis  ##) AK I: Likely secondary to mild dehydration - IV fluids -Hold lisinopril 2.5 mg - Home hold furosemide 80 mg daily  ##) Chronic atrial fibrillation: -Hold warfarin - Continue metoprolol 12.5 mg twice daily next line -continue diltiazem extended release 360 mg daily   ##) Hypothyroidism: -Continue levothyroxine 112 mcg daily  ##) COPD: Not on any home oxygen - Continue budesonide formoterol 2 puffs twice daily -Continue as needed albuterol  ##) Hyperlipidemia: -Continue atorvastatin 20 mill grams daily  Fluids: Gentle IV fluids Electrolytes: monitor and supp Nutrition: AHA diet  Prophylaxis: SCDs  Disposition: Observation, continue treatment for pneumonia and consider discharge if hemoptysis ceases.  Cristy Folks  MD [facility]  If 7PM-7AM, please contact night-coverage [email or web address] Password [email or web address]  04/26/2017, 2:00 PM

## 2017-04-26 NOTE — ED Notes (Signed)
ED Provider at bedside. 

## 2017-04-26 NOTE — ED Notes (Signed)
Hospitalist bedside 

## 2017-04-26 NOTE — ED Provider Notes (Signed)
[facility]MENT Provider Note   CSN: [id number] Arrival date & time: 04/26/17  1005     History   Chief Complaint Chief Complaint  Patient presents with  . Hemoptysis  . Abdominal Pain    HPI Justin Garner is a 84 y.o. male.  HPI  84 year old male presents with hemoptysis.  He has been having a cough with clear sputum for about 2 months.  However since yesterday he has been having blood in the sputum and most of the sputum is blood.  It is mostly a clot of blood.  He states he has not vomiting.  He does feel more short of breath than typical and had to use the oxygen that he has at home which he typically does not use.  There were reports of abdominal pain when he checked him but he states that he does not have current abdominal pain and sometimes has pain at the site of his bilateral hernias but that is not bothering him today.  There is no chest pain.  He has not noticed a fever.  He is on warfarin for atrial fibrillation.  Past Medical History:  Diagnosis Date  . ASCVD (arteriosclerotic cardiovascular disease)   . Cancer [facility]) 2009   Upper lobectomy;radiation; chemotherapy  . Chronic systolic CHF (congestive heart failure) ([place])   . CKD (chronic kidney disease), stage II   . COPD (chronic obstructive pulmonary disease) ([place])   . Coronary artery disease   . Diabetes mellitus   . GERD (gastroesophageal reflux disease)   . Hyperlipidemia   . Hypertension   . Leukocytosis 07/12/2015  . PAF (paroxysmal atrial fibrillation) (HCC)    Chronic warfarin therapy  . Primary cancer of right upper lobe of lung ([place]) 06/30/2013   RUL lobectomy 2009 with chest wall resection for superior sulcus tumor Postoperative radiation and chemotherapy; Dr Earlie Server Oncology monitor [phone]  . Pulmonary embolism (HCC)    PMH of  . Thyroid disease     Patient Active Problem List   Diagnosis Date Noted  . Hemoptysis 04/26/2017  . Lobar pneumonia ([place])  12/20/2016  . Atrial fibrillation with RVR ([place]) 06/09/2016  . Acute encephalopathy 06/09/2016  . COPD exacerbation ([place]) 11/15/2015  . Acute on chronic kidney failure-II 11/15/2015  . Malnutrition of moderate degree 11/15/2015  . Leukocytosis 07/12/2015  . Chronic obstructive pulmonary disease ([place])   . Cardiomyopathy ([place])   . CAD in native artery   . Hypomagnesemia   . CAP (community acquired pneumonia) 06/13/2015  . Acute on chronic respiratory failure with hypoxia ([place]) 06/13/2015  . Hypothyroidism 06/13/2015  . HLD (hyperlipidemia) 06/13/2015  . Chronic systolic CHF (congestive heart failure) ([place])   . Protein-calorie malnutrition, severe ([place]) 12/29/2013  . Hypokalemia 12/28/2013  . Sepsis ([place]) 12/28/2013  . 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 ([place]) 12/28/2013  . Acute systolic heart failure ([place]) 08/12/2013  . Primary cancer of right upper lobe of lung ([place]) 06/30/2013  . Coronary atherosclerosis of native coronary artery 05/28/2013  . Essential hypertension, benign 05/28/2013  . Chronic atrial fibrillation ([place]) 12/24/2012  . Pulmonary embolism ([place]) 12/24/2012  . COPD with acute bronchitis ([place]) 04/23/2008    Past Surgical History:  Procedure Laterality Date  . CAROTID STENT    . CHOLECYSTECTOMY    . FIBEROPTIC BRONCHOSCOPY AND MEIASTINOSCOPY  06/07/2007  . HERNIA REPAIR    . INGUINAL HERNIA REPAIR  10/10/2010  . LEFT HEART CATHETERIZATION WITH CORONARY ANGIOGRAM N/A 08/19/2013  Procedure: LEFT HEART CATHETERIZATION WITH CORONARY ANGIOGRAM;  Surgeon: Jettie Booze, MD;  Location: [facility] CATH LAB;  Service: Cardiovascular;  Laterality: N/A;  . R VATS,THORACOTOMY AND UPPER LOBECTOMY  08/02/2007       Home Medications    Prior to Admission medications   Medication Sig Start Date End Date Taking? Authorizing Provider  albuterol (PROVENTIL HFA;VENTOLIN HFA) 108 (90 Base) MCG/ACT inhaler Inhale 2 puffs into the lungs every 6 (six) hours as needed for wheezing or  shortness of breath. 11/16/15   Cristal Ford, DO  Artificial Tear Ointment (DRY EYES OP) Place 1 drop into both eyes as needed (dry eye).    [provider]  atorvastatin (LIPITOR) 20 MG tablet Take 20 mg by mouth daily.    [provider]  budesonide-formoterol (SYMBICORT) 80-4.5 MCG/ACT inhaler Inhale 2 puffs into the lungs every morning. 11/16/15   Cristal Ford, DO  dextromethorphan-guaiFENesin [facility] DM) 30-600 MG 12hr tablet Take 1 tablet by mouth 2 (two) times daily as needed for cough. 11/16/15   Mikhail, Velta Addison, DO  diltiazem (CARDIZEM CD) 360 MG 24 hr capsule Take 1 capsule (360 mg total) by mouth daily. 03/22/17   Jettie Booze, MD  fish oil-omega-3 fatty acids 1000 MG capsule Take 1 g by mouth daily.     [provider]  furosemide (LASIX) 20 MG tablet Take 2 tablets (40 mg total) by mouth daily. 01/03/17   Jettie Booze, MD  glipiZIDE (GLUCOTROL XL) 10 MG 24 hr tablet Take 10 mg by mouth daily. 11/17/16   [provider]  levothyroxine (SYNTHROID, LEVOTHROID) 112 MCG tablet Take 112 mcg by mouth daily.      [provider]  lisinopril (PRINIVIL,ZESTRIL) 5 MG tablet Take 0.5 tablets (2.5 mg total) by mouth daily. 01/03/17   Jettie Booze, MD  metoprolol tartrate (LOPRESSOR) 25 MG tablet Take 0.5 tablets (12.5 mg total) by mouth 2 (two) times daily. 01/03/17   Jettie Booze, MD  nitroGLYCERIN (NITROSTAT) 0.4 MG SL tablet Place 1 tablet (0.4 mg total) under the tongue every 5 (five) minutes as needed for chest pain up to 3 doses 08/03/16   Jettie Booze, MD  omeprazole (PRILOSEC) 20 MG capsule Take 20 mg by mouth every other day.     [provider]  potassium chloride (K-DUR) 10 MEQ tablet Take 2 tablets (20 mEq total) by mouth daily. 12/23/16 12/28/16  Caren Griffins, MD  senna-docusate (SENOKOT-S) 8.6-50 MG tablet Take 1 tablet by mouth at bedtime as needed for mild constipation. 08/21/16    Dhungel, Flonnie Overman, MD  warfarin (COUMADIN) 5 MG tablet TAKE AS DIRECTED BY  [facility] 01/02/17   Jettie Booze, MD    Family History Family History  Problem Relation Age of Onset  . Heart disease Father        Heart attack  . Heart attack Father   . Diabetes Brother   . Cancer Neg Hx   . Stroke Neg Hx   . Hypertension Neg Hx     Social History Social History   Tobacco Use  . Smoking status: Former Smoker    Packs/day: 1.00    Years: 50.00    Pack years: 50.00    Types: Cigarettes    Last attempt to quit: 03/20/2001    Years since quitting: 16.1  . Smokeless tobacco: Former Network engineer Use Topics  . Alcohol use: No  . Drug use: No     Allergies  Patient has no known allergies.   Review of Systems Review of Systems  Constitutional: Negative for fever.  Respiratory: Positive for cough and shortness of breath.   Cardiovascular: Negative for chest pain.  Gastrointestinal: Negative for abdominal pain and vomiting.  All other systems reviewed and are negative.    Physical Exam Updated Vital Signs BP 106/76 (BP Location: Left Arm)   Pulse (!) 120   Temp 98.3 F (36.8 C) (Oral)   Resp 18   Ht 6\' 2"  (1.88 m)   Wt 78.9 kg (174 lb)   SpO2 98%   BMI 22.34 kg/m   Physical Exam  Constitutional: He is oriented to person, place, and time. He appears well-developed and well-nourished.  Non-toxic appearance. He appears ill. No distress.  HENT:  Head: Normocephalic and atraumatic.  Right Ear: External ear normal.  Left Ear: External ear normal.  Nose: Nose normal.  Eyes: Right eye exhibits no discharge. Left eye exhibits no discharge.  Neck: Neck supple.  Cardiovascular: Normal heart sounds. An irregular rhythm present. Tachycardia present.  Pulmonary/Chest: No accessory muscle usage. Tachypnea noted. He has rales in the left lower field.  Abdominal: Soft. He exhibits no distension. There is no tenderness.  Musculoskeletal: He exhibits no edema.    Neurological: He is alert and oriented to person, place, and time.  Skin: Skin is warm and dry. He is not diaphoretic.  Nursing note and vitals reviewed.    ED Treatments / Results  Labs (all labs ordered are listed, but only abnormal results are displayed) Labs Reviewed  BASIC METABOLIC PANEL - Abnormal; Notable for the following components:      Result Value   Potassium 3.0 (*)    Glucose, Bld 117 (*)    BUN 30 (*)    Creatinine, Ser 1.62 (*)    Calcium 8.6 (*)    GFR calc non Af Amer 38 (*)    GFR calc Af Amer 44 (*)    All other components within normal limits  CBC - Abnormal; Notable for the following components:   WBC 16.3 (*)    RDW 15.6 (*)    All other components within normal limits  PROTIME-INR - Abnormal; Notable for the following components:   Prothrombin Time 23.6 (*)    All other components within normal limits  CULTURE, BLOOD (ROUTINE X 2)  CULTURE, BLOOD (ROUTINE X 2)  I-STAT TROPONIN, ED  CBG MONITORING, ED  I-STAT CG4 LACTIC ACID, ED    EKG  EKG Interpretation  Date/Time:  Thursday April 26 2017 10:40:59 EST Ventricular Rate:  120 PR Interval:    QRS Duration: 92 QT Interval:  360 QTC Calculation: 508 R Axis:   144 Text Interpretation:  ** Suspect arm lead reversal, interpretation assumes no reversal Atrial fibrillation with rapid ventricular response Septal infarct , age undetermined Lateral infarct , age undetermined Abnormal ECG rate is faster compared to Oct 2018 Confirmed by Sherwood Gambler [phone]) on 04/26/2017 1:07:35 PM       Radiology Dg Chest 2 View  Result Date: 04/26/2017 CLINICAL DATA:  Shortness of breath, hemoptysis EXAM: CHEST  2 VIEW COMPARISON:  12/20/2016 FINDINGS: Emphysema. Bullous changes in the left apex. Scarring in the right base with blunting of the right costophrenic angle likely reflecting pleural thickening. Increased densities in the mid and left lower lung concerning for pneumonia. Heart is borderline in size.  IMPRESSION: Bullous emphysema, chronic changes. Increasing density in the mid and lower lung on the left concerning for pneumonia.  Electronically Signed   By: Rolm Baptise M.D.   On: 04/26/2017 11:15    Procedures Procedures (including critical care time)  Medications Ordered in ED Medications  sodium chloride 0.9 % bolus 500 mL (not administered)  cefTRIAXone (ROCEPHIN) 1 g in dextrose 5 % 50 mL IVPB (not administered)  azithromycin (ZITHROMAX) 500 mg in dextrose 5 % 250 mL IVPB (not administered)  potassium chloride SA (K-DUR,KLOR-CON) CR tablet 40 mEq (not administered)     Initial Impression / Assessment and Plan / ED Course  I have reviewed the triage vital signs and the nursing notes.  Pertinent labs & imaging results that were available during my care of the patient were reviewed by me and considered in my medical decision making (see chart for details).     Patient presents with hemoptysis.  Chest x-ray findings and exam most consistent with pneumonia.  However he does not have a fever.  CT would probably be beneficial but at this point he cannot receive contrast based on his bump in creatinine up to 1.6.  Discussed with hospitalist, plan is for hydration and treatment and CT while as an inpatient.  Currently he does not appear to have a large volume of hemoptysis and seems to be doing it a few times a day.  However while on the warfarin he will need to be observed and so he will be admitted to the hospitalist service.  Given antibiotics for community acquired pneumonia as his last admission was in October 2018 and he lives at home.  CHA2DS2/VAS Stroke Risk Points      6 >= 2 Points: High Risk  1 - 1.99 Points: Medium Risk  0 Points: Low Risk    This is the only CHA2DS2/VAS Stroke Risk Points available for the past  year.:  Change: N/A     Details    This score determines the patient's risk of having a stroke if the  patient has atrial fibrillation.       Points Metrics    1 Has Congestive Heart Failure:  Yes   1 Has Vascular Disease:  Yes   1 Has Hypertension:  Yes   2 Age:  72   1 Has Diabetes:  Yes   0 Had Stroke:  No  Had TIA:  No  Had thromboembolism:  No   0 Male:  No             Final Clinical Impressions(s) / ED Diagnoses   Final diagnoses:  Community acquired pneumonia of left lower lobe of lung (HCC)  Cough with hemoptysis    ED Discharge Orders    None       Sherwood Gambler, MD 04/26/17 1404

## 2017-04-26 NOTE — ED Notes (Signed)
Patient transported to CT 

## 2017-04-26 NOTE — ED Notes (Signed)
Called pt for vitals recheck. No response.

## 2017-04-27 DIAGNOSIS — R042 Hemoptysis: Secondary | ICD-10-CM

## 2017-04-27 DIAGNOSIS — J181 Lobar pneumonia, unspecified organism: Secondary | ICD-10-CM | POA: Diagnosis not present

## 2017-04-27 DIAGNOSIS — I482 Chronic atrial fibrillation: Secondary | ICD-10-CM | POA: Diagnosis not present

## 2017-04-27 DIAGNOSIS — I5022 Chronic systolic (congestive) heart failure: Secondary | ICD-10-CM | POA: Diagnosis not present

## 2017-04-27 LAB — CBC
HCT: 43.3 % (ref 39.0–52.0)
Hemoglobin: 14.6 g/dL (ref 13.0–17.0)
MCH: 30.9 pg (ref 26.0–34.0)
MCHC: 33.7 g/dL (ref 30.0–36.0)
MCV: 91.5 fL (ref 78.0–100.0)
Platelets: 208 K/uL (ref 150–400)
RBC: 4.73 MIL/uL (ref 4.22–5.81)
RDW: 15.5 % (ref 11.5–15.5)
WBC: 13.2 10*3/uL — ABNORMAL HIGH (ref 4.0–10.5)

## 2017-04-27 LAB — PROTIME-INR
INR: 1.86
Prothrombin Time: 21.3 seconds — ABNORMAL HIGH (ref 11.4–15.2)

## 2017-04-27 LAB — BASIC METABOLIC PANEL WITH GFR
BUN: 29 mg/dL — ABNORMAL HIGH (ref 6–20)
CO2: 21 mmol/L — ABNORMAL LOW (ref 22–32)
Calcium: 8 mg/dL — ABNORMAL LOW (ref 8.9–10.3)
GFR calc non Af Amer: 46 mL/min — ABNORMAL LOW (ref >60)
Glucose, Bld: 99 mg/dL (ref 65–99)

## 2017-04-27 LAB — RESPIRATORY PANEL BY PCR
Adenovirus: NOT DETECTED
BORDETELLA PERTUSSIS-RVPCR: NOT DETECTED
CHLAMYDOPHILA PNEUMONIAE-RVPPCR: NOT DETECTED
CORONAVIRUS HKU1-RVPPCR: NOT DETECTED
CORONAVIRUS NL63-RVPPCR: NOT DETECTED
Coronavirus 229E: NOT DETECTED
Coronavirus OC43: NOT DETECTED
INFLUENZA A H3-RVPPCR: NOT DETECTED
INFLUENZA A-RVPPCR: NOT DETECTED
INFLUENZA B-RVPPCR: NOT DETECTED
Influenza A H1 2009: NOT DETECTED
Influenza A H1: NOT DETECTED
METAPNEUMOVIRUS-RVPPCR: NOT DETECTED
Mycoplasma pneumoniae: NOT DETECTED
PARAINFLUENZA VIRUS 2-RVPPCR: NOT DETECTED
PARAINFLUENZA VIRUS 3-RVPPCR: NOT DETECTED
PARAINFLUENZA VIRUS 4-RVPPCR: NOT DETECTED
Parainfluenza Virus 1: NOT DETECTED
RHINOVIRUS / ENTEROVIRUS - RVPPCR: NOT DETECTED
Respiratory Syncytial Virus: NOT DETECTED

## 2017-04-27 LAB — BASIC METABOLIC PANEL
Anion gap: 14 (ref 5–15)
Chloride: 104 mmol/L (ref 101–111)
Creatinine, Ser: 1.38 mg/dL — ABNORMAL HIGH (ref 0.61–1.24)
GFR calc Af Amer: 53 mL/min — ABNORMAL LOW (ref >60)
Potassium: 2.8 mmol/L — ABNORMAL LOW (ref 3.5–5.1)
Sodium: 139 mmol/L (ref 135–145)

## 2017-04-27 MED ORDER — POTASSIUM CHLORIDE CRYS ER 20 MEQ PO TBCR
40.0000 meq | EXTENDED_RELEASE_TABLET | Freq: Three times a day (TID) | ORAL | Status: AC
  Administered 2017-04-27 – 2017-04-28 (×4): 40 meq via ORAL
  Filled 2017-04-27 (×4): qty 2

## 2017-04-27 NOTE — Care Management Obs Status (Signed)
[place] NOTIFICATION   Patient Details  Name: Justin Garner MRN: [id number] Date of Birth: 02-02-1933   Medicare Observation Status Notification Given:  Yes    Carles Collet, RN 04/27/2017, 10:57 AM

## 2017-04-27 NOTE — Consult Note (Signed)
PULMONARY / CRITICAL CARE MEDICINE   Name: Justin Garner MRN: [id number] DOB: May 26, 1932    ADMISSION DATE:  04/26/2017 CONSULTATION DATE:  04/27/2017  REFERRING MD: Dr. Carolin Sicks  CHIEF COMPLAINT: Coughing up blood  HISTORY OF PRESENT ILLNESS: 84 year old male who presented to ED 2/7 with a 1 day hx of hemoptysis. He reports that the hemoptysis started around Tuesday (2/6), which was the day he had the most blood. States the blood is bright red and comes up with clear sputum. He also reports a 2 month history of chronic productive cough with clear sputum. He has a past medical history significant for lung cancer with partial R lung resection in 2009, 50 pack year smoking history, afib on coumadin, DM II, CKD, CHF, PE.   In the ED he was found to have an elevated WBC of 16.3 (has baseline chronic leukocytosis of unknown etiology), Afib with RVR, AKI and a CXR showing increasing density to the mid and lower left lung, indicating pneumonia. CTA was negative for PE. PCCM has been consulted for continued episodes of hemoptysis.  Pt reports hemoptysis has slowed since admit.  He reports two episodes 2/8 that was sputum mixed with blood.  He denies yellow sputum production, fevers, night sweats.  The patient reports occasional chills.   He worked in a Writer for a living.  States he had his flu shot this year.  Denies known exposures to TB.   PAST MEDICAL HISTORY :  He  has a past medical history of ASCVD (arteriosclerotic cardiovascular disease), Cancer ([place]) (7989), Chronic systolic CHF (congestive heart failure) ([place]), CKD (chronic kidney disease), stage II, COPD (chronic obstructive pulmonary disease) ([place]), Coronary artery disease, Diabetes mellitus, GERD (gastroesophageal reflux disease), Hyperlipidemia, Hypertension, Leukocytosis (07/12/2015), PAF (paroxysmal atrial fibrillation) ([place]), Primary cancer of right upper lobe of lung ([place]) (06/30/2013), Pulmonary embolism ([place]), and Thyroid  disease.  PAST SURGICAL HISTORY: He  has a past surgical history that includes Cholecystectomy; Hernia repair; Carotid stent; Inguinal hernia repair (10/10/2010); FIBEROPTIC BRONCHOSCOPY AND MEIASTINOSCOPY (06/07/2007); R VATS,THORACOTOMY AND UPPER LOBECTOMY (08/02/2007); and left heart catheterization with coronary angiogram (N/A, 08/19/2013).  No Known Allergies  No current facility-administered medications on file prior to encounter.    Current Outpatient Medications on File Prior to Encounter  Medication Sig  . albuterol (PROVENTIL HFA;VENTOLIN HFA) 108 (90 Base) MCG/ACT inhaler Inhale 2 puffs into the lungs every 6 (six) hours as needed for wheezing or shortness of breath.  . Artificial Tear Ointment (DRY EYES OP) Place 1 drop into both eyes as needed (dry eye).  Marland Kitchen atorvastatin (LIPITOR) 20 MG tablet Take 20 mg by mouth daily.  . budesonide-formoterol (SYMBICORT) 80-4.5 MCG/ACT inhaler Inhale 2 puffs into the lungs every morning.  Marland Kitchen dextromethorphan-guaiFENesin (MUCINEX DM) 30-600 MG 12hr tablet Take 1 tablet by mouth 2 (two) times daily as needed for cough.  . diltiazem (CARDIZEM CD) 360 MG 24 hr capsule Take 1 capsule (360 mg total) by mouth daily.  . fish oil-omega-3 fatty acids 1000 MG capsule Take 1 g by mouth daily.   . furosemide (LASIX) 20 MG tablet Take 2 tablets (40 mg total) by mouth daily.  Marland Kitchen glipiZIDE (GLUCOTROL XL) 10 MG 24 hr tablet Take 10 mg by mouth daily.  Marland Kitchen levothyroxine (SYNTHROID, LEVOTHROID) 112 MCG tablet Take 112 mcg by mouth daily.    Marland Kitchen lisinopril (PRINIVIL,ZESTRIL) 5 MG tablet Take 0.5 tablets (2.5 mg total) by mouth daily.  . metoprolol tartrate (LOPRESSOR) 25 MG tablet Take 0.5  tablets (12.5 mg total) by mouth 2 (two) times daily.  . nitroGLYCERIN (NITROSTAT) 0.4 MG SL tablet Place 1 tablet (0.4 mg total) under the tongue every 5 (five) minutes as needed for chest pain up to 3 doses  . omeprazole (PRILOSEC) 20 MG capsule Take 20 mg by mouth daily.   .  potassium chloride (K-DUR) 10 MEQ tablet Take 2 tablets (20 mEq total) by mouth daily.  Marland Kitchen senna-docusate (SENOKOT-S) 8.6-50 MG tablet Take 1 tablet by mouth at bedtime as needed for mild constipation. (Patient taking differently: Take 1 tablet by mouth daily. )  . warfarin (COUMADIN) 5 MG tablet Take 5-7.5 mg by mouth See admin instructions. Day 1: 7.5mg  Day 2: 5mg  Day 3: 7.5mg  Day 4: 5mg  Day 5: 7.5mg  Day 6: 5mg  Day 7: 5mg , continue alternating in that order. Next anti-coag visit, 05/15/17.  Marland Kitchen warfarin (COUMADIN) 5 MG tablet TAKE AS DIRECTED BY  COUMADIN CLINIC (Patient not taking: Reported on 04/26/2017)    FAMILY HISTORY:  His indicated that his mother is deceased. He indicated that his father is deceased. He indicated that his sister is deceased. He indicated that only one of his two brothers is alive. He indicated that his maternal grandmother is deceased. He indicated that his maternal grandfather is deceased. He indicated that his paternal grandmother is deceased. He indicated that his paternal grandfather is deceased. He indicated that the status of his neg hx is unknown.   SOCIAL HISTORY: He  reports that he quit smoking about 16 years ago. His smoking use included cigarettes. He has a 50.00 pack-year smoking history. He has quit using smokeless tobacco. He reports that he does not drink alcohol or use drugs.  REVIEW OF SYSTEMS:   General: denies fever, malaise CV/Pulm: admits cough, occasional LE edema; denies shortness of breath, chest pain GI/GU: denies abdominal pain, changes in bowel or bladder habits, decreased appetite  SUBJECTIVE: Patient laying comfortably in bed, states he is feeling fine. Recounts a history of x3 episodes of PNA within the last 2 years. Most recent episode of hemoptysis has been improving over the last few days, states the first day he had the most but since then it only happens "once in a while" when he coughs. He does not feel ill and wants to go home  tomorrow. He lives at home with his grandson.   VITAL SIGNS: BP 106/63 (BP Location: Right Arm)   Pulse 77   Temp 97.7 F (36.5 C) (Oral)   Resp 20   Ht 6\' 2"  (1.88 m)   Wt 177 lb 4 oz (80.4 kg)   SpO2 95%   BMI 22.76 kg/m   HEMODYNAMICS:    VENTILATOR SETTINGS:    INTAKE / OUTPUT: I/O last 3 completed shifts: In: -  Out: 160 [Urine:160]  PHYSICAL EXAMINATION: General: Elderly male, appears stated age in no acute distress Neuro:  Alert, oriented, appropriate affect, follows commands, generalized weakness HEENT: Normocephalic, atraumatic, nml ROM in neck, no apparent JVD Cardiovascular: Irregular rate and rhythm, no pitting edema in bilateral LE, 2+ radial pulses Lungs: CTA anteriorly, decreased breath sounds in left lateral lung field, no accessory muscle use, normal work of breathing Abdomen: normoactive BS in all quadrants, soft, non-distended, non tender Skin: Warm, dry, numerous varicose veins in bilateral LE.  LABS:  BMET Recent Labs  Lab 04/26/17 1040 04/27/17 0346  NA 139 139  K 3.0* 2.8*  CL 103 104  CO2 22 21*  BUN 30* 29*  CREATININE 1.62* 1.38*  GLUCOSE 117* 99    Electrolytes Recent Labs  Lab 04/26/17 1040 04/27/17 0346  CALCIUM 8.6* 8.0*    CBC Recent Labs  Lab 04/26/17 1040 04/27/17 0346  WBC 16.3* 13.2*  HGB 16.3 14.6  HCT 47.5 43.3  PLT 228 208    Coag's Recent Labs  Lab 04/26/17 1040 04/27/17 0346  INR 2.12 1.86    Sepsis Markers Recent Labs  Lab 04/26/17 1425  LATICACIDVEN 1.53    ABG No results for input(s): PHART, PCO2ART, PO2ART in the last 168 hours.  Liver Enzymes No results for input(s): AST, ALT, ALKPHOS, BILITOT, ALBUMIN in the last 168 hours.  Cardiac Enzymes No results for input(s): TROPONINI, PROBNP in the last 168 hours.  Glucose Recent Labs  Lab 04/26/17 1307  GLUCAP 92    Imaging Ct Angio Chest Pe W Or Wo Contrast  Result Date: 04/26/2017 CLINICAL DATA:  Shortness of breath and  hemoptysis. History of lung cancer in 2009. EXAM: CT ANGIOGRAPHY CHEST WITH CONTRAST TECHNIQUE: Multidetector CT imaging of the chest was performed using the standard protocol during bolus administration of intravenous contrast. Multiplanar CT image reconstructions and MIPs were obtained to evaluate the vascular anatomy. CONTRAST:  65mL ISOVUE-370 IOPAMIDOL (ISOVUE-370) INJECTION 76% COMPARISON:  Chest CT 06/20/2015 FINDINGS: Cardiovascular: Satisfactory opacification of the pulmonary arteries to the segmental level. No evidence of pulmonary embolism. Enlarged heart. No pericardial effusion. Calcific atherosclerotic disease of the coronary arteries and less so aorta. Contrast reflux into the hepatic veins. Mediastinum/Nodes: Mild anterior mediastinal lymphadenopathy with the most prominent left pretracheal lymph node measuring 12 mm in short axis. Normal appearance of the trachea and esophagus. No thyroid nodules. Lungs/Pleura: Advanced upper lobe predominant emphysematous changes with large bulla formation bilaterally. Stable postsurgical changes from right upper lobectomy. Small right pleural effusion. Diffuse alveolar and interstitial opacities throughout the left lung. Upper Abdomen: No acute abnormality. Musculoskeletal: No chest wall abnormality. No acute or significant osseous findings. Review of the MIP images confirms the above findings. IMPRESSION: Diffuse alveolar and interstitial opacities throughout the left lung with appearance most suggestive of infectious consolidation. Asymmetric pulmonary edema is considered less likely. Stable postsurgical changes from right upper lobectomy. Persistent small right pleural effusion. Underlying severe upper thorax predominant emphysematous changes with large bulla formation. Mild mediastinal lymphadenopathy. Enlarged heart with probable right heart failure. Heavy calcific atherosclerotic disease of the coronary arteries. No evidence of pulmonary embolus. Aortic  Atherosclerosis (ICD10-I70.0) and Emphysema (ICD10-J43.9). Electronically Signed   By: Fidela Salisbury M.D.   On: 04/26/2017 16:38    STUDIES:  CXR 2/7 >>  increasing density to the mid and lower left lung, concerning for pneumonia  CTA 2/7 >> negative for PE, severe upper thorax predominant emphysematous changes with large bulla formation, interstitial opacities in left lung field suggesting PNA, right pleural effusion  CULTURES: Blood 2/7 >> RVP 2/8 >>   ANTIBIOTICS: Azithromycin 2/7 >> Ceftriaxone 2/7 >>  SIGNIFICANT EVENTS: 2/07 Admit with hemoptysis  2/08  PCCM consulted for hemoptysis   LINES/TUBES:   DISCUSSION: 84 year old male with PMHx of lung CA, PE, Afib on coumadin admitted for hemoptysis. Found to have left mid to lower lobe PNA. He has had some ongoing episodes of hemoptysis which have apparently decreased over the last few days.  DDx includes PNA, COPD exacerbation, viral illness  ASSESSMENT / PLAN:  PULMONARY A: Hemoptysis - suspect this is in the setting of possible PNA vs URI/COPD exacerbation.   ? Left mid to lower lobe PNA -  does have some chronic changes in LLL P:   Monitor hemoptysis production Assess RVP  Continue azithro / rocephin, consider 5-7 day course abx Follow intermittent CXR Consider restarting coumadin and observe for 24-48 hours  Follow up with PCP or Pulmonary in 2-3 weeks for CXR to ensure clearance of LLL.  If not, would recommend pulmonary referral.  If insomnia due to cough, could use Delsym for cough suppression at night   FAMILY  - Updates: No family at bedside 2/8.  Patient updated on plan of care by Dr. Sabra Heck.   Kathleene Hazel, PA-S   Noe Gens, NP-C [facility] Pgr: [phone] or if no answer [phone] 04/27/2017, 3:50 PM  Attending:  I have seen and examined the patient with nurse practitioner/resident and agree with the note above.  We formulated the plan together and I elicited the  following history.    Subjective: Hemoptysis  Objective: Vitals:   04/27/17 0541 04/27/17 0845 04/27/17 0927 04/27/17 1421  BP: 107/77  105/68 106/63  Pulse: (!) 141  (!) 110 77  Resp: 20  20 20   Temp: 98.4 F (36.9 C)  97.6 F (36.4 C) 97.7 F (36.5 C)  TempSrc: Oral  Oral Oral  SpO2: 98% 95% 97% 95%  Weight:      Height:          Intake/Output Summary (Last 24 hours) at 04/27/2017 1630 Last data filed at 04/27/2017 0900 Gross per 24 hour  Intake 240 ml  Output 360 ml  Net -120 ml   General: awake and alert. No distress observed. HEENT: no epistaxis. No adenopathy. Chest: faint coarse crackles at the left base, otherwise clear to auscultation. Cor: s1s2, no murmur, gallop or rub. Abd: soft, non tender. No mass Ext: no cyanosis, pitting edema or clubbing.    CBC    Component Value Date/Time   WBC 13.2 (H) 04/27/2017 0346   RBC 4.73 04/27/2017 0346   HGB 14.6 04/27/2017 0346   HGB 15.0 09/06/2015 1020   HCT 43.3 04/27/2017 0346   HCT 45.0 09/06/2015 1020   PLT 208 04/27/2017 0346   PLT 201 09/06/2015 1020   MCV 91.5 04/27/2017 0346   MCV 91.8 09/06/2015 1020   MCH 30.9 04/27/2017 0346   MCHC 33.7 04/27/2017 0346   RDW 15.5 04/27/2017 0346   RDW 16.5 (H) 09/06/2015 1020   LYMPHSABS 1.5 12/20/2016 1942   LYMPHSABS 2.3 09/06/2015 1020   MONOABS 1.2 (H) 12/20/2016 1942   MONOABS 0.9 09/06/2015 1020   EOSABS 0.1 12/20/2016 1942   EOSABS 0.2 09/06/2015 1020   BASOSABS 0.0 12/20/2016 1942   BASOSABS 0.1 09/06/2015 1020    BMET    Component Value Date/Time   NA 139 04/27/2017 0346   NA 141 09/14/2016 1124   NA 144 07/12/2015 1348   K 2.8 (L) 04/27/2017 0346   K 4.0 07/12/2015 1348   CL 104 04/27/2017 0346   CO2 21 (L) 04/27/2017 0346   CO2 30 (H) 07/12/2015 1348   GLUCOSE 99 04/27/2017 0346   GLUCOSE 86 07/12/2015 1348   BUN 29 (H) 04/27/2017 0346   BUN 35 (H) 09/14/2016 1124   BUN 17.9 07/12/2015 1348   CREATININE 1.38 (H) 04/27/2017 0346    CREATININE 1.4 (H) 07/12/2015 1348   CALCIUM 8.0 (L) 04/27/2017 0346   CALCIUM 9.1 07/12/2015 1348   GFRNONAA 46 (L) 04/27/2017 0346   GFRAA 53 (L) 04/27/2017 0346    CXR images  CT ANGIO CHEST PE  W OR WO CONTRAST  Final Result    DG Chest 2 View  Final Result     ASSESSMENT / PLAN:  PULMONARY A: Hemoptysis - suspect this is in the setting of possible PNA vs URI/COPD exacerbation.  ? Left mid to lower lobe PNA - does have some chronic changes in LLL. Possibly due to bronchiectasis. P:   Monitor hemoptysis production. No change in Hgb. Assess RVP  Continue azithro / rocephin, consider 5-7 day course abx Follow intermittent CXR Consider restarting coumadin and observe for 24-48 hours  Follow up with PCP or Pulmonary in 2-3 weeks for CXR to ensure clearance of LLL.  If not, would recommend pulmonary referral.  If insomnia due to cough, could use Delsym for cough suppression at night    Jesus Genera, MD [place] Pager: [phone]

## 2017-04-27 NOTE — Progress Notes (Signed)
PROGRESS NOTE    Justin Garner  CHY:[id number] DOB: 03/25/1932 DOA: 04/26/2017 PCP: [name], MD   Brief Narrative:  84 y.o. male with medical history significant of paroxysmal atrial fibrillation on warfarin, COPD recently weaned off home oxygen, right upper lobe lung cancer status post neoadjuvant chemoradiation status post resection in 2009, remote history of pulmonary embolism, hyperlipidemia, diabetes on oral hypoglycemics, hypertension who comes in with 3 days of hemoptysis.  In the ER patient was found to have A. fib with RVR, leukocytosis elevated creatinine and chest x-ray with infiltrates.  Started on antibiotics and admitted for further evaluation.  Assessment & Plan:   #Hemoptysis likely from the bronchiectasis/chronic bronchitis changes.  CT scan of chest with infiltration and opacity throughout the left lung.  Patient is still with some hemoptysis.  He is on Coumadin with INR of 2.  Not hypoxic.  Continue broad-spectrum antibiotics with ceftriaxone and azithromycin to treat for possible underlying pneumonia.  Coumadin is currently on hold. -Follow-up culture results. -Pulmonary consult requested, discussed with Dr. Sabra Heck.  #Chronic atrial fibrillation: Coumadin is currently on hold.  INR 1.8.  Continue metoprolol, diltiazem.  Monitor heart rate.  #Hypothyroidism: Continue Synthroid.  #History of COPD not on oxygen: Continue bronchodilators.  On antibiotics as above.  #Acute kidney injury likely prerenal: Lisinopril and diuretics on hold.  Off IV fluid.  Patient can eat and drink.  Serum creatinine level improved to baseline.  Monitor BMP.  Avoid nephrotoxins.  #Hyperlipidemia: On statin.  DVT prophylaxis: coumadin on hold now Code Status:full Family Communication: Discussed with the patient's daughter at bedside Disposition Plan: Currently admitted    Consultants:   Pulmonary  Procedures: None Antimicrobials: Ceftriaxone and  azithromycin  Subjective: Seen and examined at bedside.  Still having some streaks of blood during cough.  No chest pain, shortness of breath, no nausea or vomiting.  No fever.  Had some chills.  Objective: Vitals:   04/27/17 0131 04/27/17 0541 04/27/17 0845 04/27/17 0927  BP: 118/80 107/77  105/68  Pulse: 83 (!) 141  (!) 110  Resp: 20 20  20   Temp: 98.6 F (37 C) 98.4 F (36.9 C)  97.6 F (36.4 C)  TempSrc: Oral Oral  Oral  SpO2: 95% 98% 95% 97%  Weight:      Height:        Intake/Output Summary (Last 24 hours) at 04/27/2017 1322 Last data filed at 04/27/2017 0900 Gross per 24 hour  Intake 240 ml  Output 360 ml  Net -120 ml   Filed Weights   04/26/17 1017 04/26/17 1845  Weight: 78.9 kg (174 lb) 80.4 kg (177 lb 4 oz)    Examination:  General exam: Appears calm and comfortable  Respiratory system: Crackles on the left side of the lung, decreased breath sound on the right upper lung field Cardiovascular system: Regular rate rhythm S1-S2 normal.  No pedal edema. Gastrointestinal system: Abdomen is nondistended, soft and nontender. Normal bowel sounds heard. Central nervous system: Alert and oriented. No focal neurological deficits. Extremities: Symmetric 5 x 5 power. Skin: No rashes, lesions or ulcers Psychiatry: Judgement and insight appear normal. Mood & affect appropriate.     Data Reviewed: I have personally reviewed following labs and imaging studies  CBC: Recent Labs  Lab 04/26/17 1040 04/27/17 0346  WBC 16.3* 13.2*  HGB 16.3 14.6  HCT 47.5 43.3  MCV 92.6 91.5  PLT 228 878   Basic Metabolic Panel: Recent Labs  Lab 04/26/17 1040 04/27/17 0346  NA  139 139  K 3.0* 2.8*  CL 103 104  CO2 22 21*  GLUCOSE 117* 99  BUN 30* 29*  CREATININE 1.62* 1.38*  CALCIUM 8.6* 8.0*   GFR: Estimated Creatinine Clearance: 46.9 mL/min (A) (by C-G formula based on SCr of 1.38 mg/dL (H)). Liver Function Tests: No results for input(s): AST, ALT, ALKPHOS, BILITOT, PROT,  ALBUMIN in the last 168 hours. No results for input(s): LIPASE, AMYLASE in the last 168 hours. No results for input(s): AMMONIA in the last 168 hours. Coagulation Profile: Recent Labs  Lab 04/26/17 1040 04/27/17 0346  INR 2.12 1.86   Cardiac Enzymes: No results for input(s): CKTOTAL, CKMB, CKMBINDEX, TROPONINI in the last 168 hours. BNP (last 3 results) No results for input(s): PROBNP in the last 8760 hours. HbA1C: No results for input(s): HGBA1C in the last 72 hours. CBG: Recent Labs  Lab 04/26/17 1307  GLUCAP 92   Lipid Profile: No results for input(s): CHOL, HDL, LDLCALC, TRIG, CHOLHDL, LDLDIRECT in the last 72 hours. Thyroid Function Tests: No results for input(s): TSH, T4TOTAL, FREET4, T3FREE, THYROIDAB in the last 72 hours. Anemia Panel: No results for input(s): VITAMINB12, FOLATE, FERRITIN, TIBC, IRON, RETICCTPCT in the last 72 hours. Sepsis Labs: Recent Labs  Lab 04/26/17 1425  LATICACIDVEN 1.53    Recent Results (from the past 240 hour(s))  Culture, blood (routine x 2)     Status: None (Preliminary result)   Collection Time: 04/26/17  2:05 PM  Result Value Ref Range Status   Specimen Description BLOOD RIGHT ANTECUBITAL  Final   Special Requests   Final    IN PEDIATRIC BOTTLE Blood Culture results may not be optimal due to an excessive volume of blood received in culture bottles   Culture   Final    NO GROWTH < 24 HOURS Performed at [facility] [address] [place], [place] [address]    Report Status PENDING  Incomplete  Culture, blood (routine x 2)     Status: None (Preliminary result)   Collection Time: 04/26/17  3:07 PM  Result Value Ref Range Status   Specimen Description BLOOD RIGHT ARM  Final   Special Requests   Final    IN PEDIATRIC BOTTLE Blood Culture results may not be optimal due to an excessive volume of blood received in culture bottles   Culture   Final    NO GROWTH < 24 HOURS Performed at [facility], [place] [address], [place], [place] [address]    Report Status PENDING  Incomplete         Radiology Studies: Dg Chest 2 View  Result Date: 04/26/2017 CLINICAL DATA:  Shortness of breath, hemoptysis EXAM: CHEST  2 VIEW COMPARISON:  12/20/2016 FINDINGS: Emphysema. Bullous changes in the left apex. Scarring in the right base with blunting of the right costophrenic angle likely reflecting pleural thickening. Increased densities in the mid and left lower lung concerning for pneumonia. Heart is borderline in size. IMPRESSION: Bullous emphysema, chronic changes. Increasing density in the mid and lower lung on the left concerning for pneumonia. Electronically Signed   By: Rolm Baptise M.D.   On: 04/26/2017 11:15   Ct Angio Chest Pe W Or Wo Contrast  Result Date: 04/26/2017 CLINICAL DATA:  Shortness of breath and hemoptysis. History of lung cancer in 2009. EXAM: CT ANGIOGRAPHY CHEST WITH CONTRAST TECHNIQUE: Multidetector CT imaging of the chest was performed using the standard protocol during bolus administration of intravenous contrast. Multiplanar CT image reconstructions  and MIPs were obtained to evaluate the vascular anatomy. CONTRAST:  48mL ISOVUE-370 IOPAMIDOL (ISOVUE-370) INJECTION 76% COMPARISON:  Chest CT 06/20/2015 FINDINGS: Cardiovascular: Satisfactory opacification of the pulmonary arteries to the segmental level. No evidence of pulmonary embolism. Enlarged heart. No pericardial effusion. Calcific atherosclerotic disease of the coronary arteries and less so aorta. Contrast reflux into the hepatic veins. Mediastinum/Nodes: Mild anterior mediastinal lymphadenopathy with the most prominent left pretracheal lymph node measuring 12 mm in short axis. Normal appearance of the trachea and esophagus. No thyroid nodules. Lungs/Pleura: Advanced upper lobe predominant emphysematous changes with large bulla formation bilaterally. Stable postsurgical changes from right upper lobectomy. Small right pleural effusion. Diffuse alveolar  and interstitial opacities throughout the left lung. Upper Abdomen: No acute abnormality. Musculoskeletal: No chest wall abnormality. No acute or significant osseous findings. Review of the MIP images confirms the above findings. IMPRESSION: Diffuse alveolar and interstitial opacities throughout the left lung with appearance most suggestive of infectious consolidation. Asymmetric pulmonary edema is considered less likely. Stable postsurgical changes from right upper lobectomy. Persistent small right pleural effusion. Underlying severe upper thorax predominant emphysematous changes with large bulla formation. Mild mediastinal lymphadenopathy. Enlarged heart with probable right heart failure. Heavy calcific atherosclerotic disease of the coronary arteries. No evidence of pulmonary embolus. Aortic Atherosclerosis (ICD10-I70.0) and Emphysema (ICD10-J43.9). Electronically Signed   By: Fidela Salisbury M.D.   On: 04/26/2017 16:38        Scheduled Meds: . atorvastatin  20 mg Oral Daily  . diltiazem  360 mg Oral Daily  . levothyroxine  112 mcg Oral QAC breakfast  . metoprolol tartrate  12.5 mg Oral BID  . mometasone-formoterol  2 puff Inhalation BID  . omega-3 acid ethyl esters  1 g Oral Daily  . pantoprazole  40 mg Oral Daily  . potassium chloride  40 mEq Oral TID   Continuous Infusions: . azithromycin    . cefTRIAXone (ROCEPHIN)  IV       LOS: 0 days    Dron Tanna Furry, MD [facility] Pager [phone]  If 7PM-7AM, please contact night-coverage [email or web address] Password [email or web address] 04/27/2017, 1:22 PM

## 2017-04-28 DIAGNOSIS — Z79899 Other long term (current) drug therapy: Secondary | ICD-10-CM | POA: Diagnosis not present

## 2017-04-28 DIAGNOSIS — Z9221 Personal history of antineoplastic chemotherapy: Secondary | ICD-10-CM | POA: Diagnosis not present

## 2017-04-28 DIAGNOSIS — I130 Hypertensive heart and chronic kidney disease with heart failure and stage 1 through stage 4 chronic kidney disease, or unspecified chronic kidney disease: Secondary | ICD-10-CM | POA: Diagnosis present

## 2017-04-28 DIAGNOSIS — Z79890 Hormone replacement therapy (postmenopausal): Secondary | ICD-10-CM | POA: Diagnosis not present

## 2017-04-28 DIAGNOSIS — Z7951 Long term (current) use of inhaled steroids: Secondary | ICD-10-CM | POA: Diagnosis not present

## 2017-04-28 DIAGNOSIS — Z86711 Personal history of pulmonary embolism: Secondary | ICD-10-CM | POA: Diagnosis not present

## 2017-04-28 DIAGNOSIS — Z7984 Long term (current) use of oral hypoglycemic drugs: Secondary | ICD-10-CM | POA: Diagnosis not present

## 2017-04-28 DIAGNOSIS — J189 Pneumonia, unspecified organism: Secondary | ICD-10-CM | POA: Diagnosis present

## 2017-04-28 DIAGNOSIS — N179 Acute kidney failure, unspecified: Secondary | ICD-10-CM | POA: Diagnosis present

## 2017-04-28 DIAGNOSIS — E1122 Type 2 diabetes mellitus with diabetic chronic kidney disease: Secondary | ICD-10-CM | POA: Diagnosis present

## 2017-04-28 DIAGNOSIS — Z8249 Family history of ischemic heart disease and other diseases of the circulatory system: Secondary | ICD-10-CM | POA: Diagnosis not present

## 2017-04-28 DIAGNOSIS — Z7901 Long term (current) use of anticoagulants: Secondary | ICD-10-CM | POA: Diagnosis not present

## 2017-04-28 DIAGNOSIS — R042 Hemoptysis: Secondary | ICD-10-CM | POA: Diagnosis not present

## 2017-04-28 DIAGNOSIS — E785 Hyperlipidemia, unspecified: Secondary | ICD-10-CM | POA: Diagnosis present

## 2017-04-28 DIAGNOSIS — I2510 Atherosclerotic heart disease of native coronary artery without angina pectoris: Secondary | ICD-10-CM | POA: Diagnosis present

## 2017-04-28 DIAGNOSIS — J440 Chronic obstructive pulmonary disease with acute lower respiratory infection: Secondary | ICD-10-CM | POA: Diagnosis present

## 2017-04-28 DIAGNOSIS — I482 Chronic atrial fibrillation: Secondary | ICD-10-CM | POA: Diagnosis not present

## 2017-04-28 DIAGNOSIS — Z923 Personal history of irradiation: Secondary | ICD-10-CM | POA: Diagnosis not present

## 2017-04-28 DIAGNOSIS — E039 Hypothyroidism, unspecified: Secondary | ICD-10-CM | POA: Diagnosis present

## 2017-04-28 DIAGNOSIS — J181 Lobar pneumonia, unspecified organism: Secondary | ICD-10-CM | POA: Diagnosis not present

## 2017-04-28 DIAGNOSIS — Z85118 Personal history of other malignant neoplasm of bronchus and lung: Secondary | ICD-10-CM | POA: Diagnosis not present

## 2017-04-28 DIAGNOSIS — I480 Paroxysmal atrial fibrillation: Secondary | ICD-10-CM | POA: Diagnosis present

## 2017-04-28 DIAGNOSIS — Z833 Family history of diabetes mellitus: Secondary | ICD-10-CM | POA: Diagnosis not present

## 2017-04-28 DIAGNOSIS — Z902 Acquired absence of lung [part of]: Secondary | ICD-10-CM | POA: Diagnosis not present

## 2017-04-28 DIAGNOSIS — E860 Dehydration: Secondary | ICD-10-CM | POA: Diagnosis present

## 2017-04-28 DIAGNOSIS — I5022 Chronic systolic (congestive) heart failure: Secondary | ICD-10-CM | POA: Diagnosis present

## 2017-04-28 LAB — BASIC METABOLIC PANEL
ANION GAP: 13 (ref 5–15)
BUN: 27 mg/dL — ABNORMAL HIGH (ref 6–20)
CALCIUM: 8.1 mg/dL — AB (ref 8.9–10.3)
CO2: 23 mmol/L (ref 22–32)
CREATININE: 1.39 mg/dL — AB (ref 0.61–1.24)
Chloride: 103 mmol/L (ref 101–111)
GFR calc Af Amer: 53 mL/min — ABNORMAL LOW (ref >60)
GFR, EST NON AFRICAN AMERICAN: 46 mL/min — AB (ref >60)
GLUCOSE: 129 mg/dL — AB (ref 65–99)
Potassium: 3.4 mmol/L — ABNORMAL LOW (ref 3.5–5.1)
Sodium: 139 mmol/L (ref 135–145)

## 2017-04-28 LAB — CBC
HCT: 42.5 % (ref 39.0–52.0)
Hemoglobin: 14 g/dL (ref 13.0–17.0)
MCH: 30.8 pg (ref 26.0–34.0)
MCHC: 32.9 g/dL (ref 30.0–36.0)
MCV: 93.6 fL (ref 78.0–100.0)
PLATELETS: 225 10*3/uL (ref 150–400)
RBC: 4.54 MIL/uL (ref 4.22–5.81)
RDW: 15.8 % — AB (ref 11.5–15.5)
WBC: 11.2 10*3/uL — AB (ref 4.0–10.5)

## 2017-04-28 LAB — PROTIME-INR
INR: 1.62
Prothrombin Time: 19.1 seconds — ABNORMAL HIGH (ref 11.4–15.2)

## 2017-04-28 MED ORDER — WARFARIN SODIUM 5 MG PO TABS
5.0000 mg | ORAL_TABLET | Freq: Once | ORAL | Status: AC
  Administered 2017-04-28: 5 mg via ORAL
  Filled 2017-04-28: qty 1

## 2017-04-28 MED ORDER — WARFARIN - PHARMACIST DOSING INPATIENT: Freq: Every day | Status: DC

## 2017-04-28 NOTE — Progress Notes (Signed)
ANTICOAGULATION CONSULT NOTE Pharmacy Consult for warfarin  Indication: atrial fibrillation  No Known Allergies  Patient Measurements: Height: 6\' 2"  (188 cm) Weight: 177 lb 4 oz (80.4 kg) IBW/kg (Calculated) : 82.2  Vital Signs: Temp: 97.4 F (36.3 C) (02/09 1344) Temp Source: Oral (02/09 1344) BP: 114/68 (02/09 1344) Pulse Rate: 61 (02/09 1344)  Labs: Recent Labs    04/26/17 1040 04/27/17 0346 04/28/17 0249  HGB 16.3 14.6 14.0  HCT 47.5 43.3 42.5  PLT 228 208 225  LABPROT 23.6* 21.3* 19.1*  INR 2.12 1.86 1.62  CREATININE 1.62* 1.38* 1.39*   Assessment: 84 yo male on warfarin PTA for hx atrial fibrillation admitted with concern for PNA and hemoptysis. INR 1.62 and CBC stable. To resume warfarin this evening.  PTA dose: 7.5 mg on TTS and 5 mg AOD's with last clinic visit on 04/17/17.  Goal of Therapy:  INR 2-3 Monitor platelets by anticoagulation protocol: Yes   Plan:  1. Warfarin 5 mg x this evening  2. Daily INR   Vincenza Hews, PharmD, BCPS 04/28/2017, 5:20 PM

## 2017-04-28 NOTE — Progress Notes (Signed)
PROGRESS NOTE [facility]   TOA MIA   NGE:[id number] DOB: 1932-05-13  DOA: 04/26/2017 PCP: [name], MD   Brief Narrative:  Justin Garner 84 y.o.malewith medical history significant ofparoxysmal atrial fibrillation on warfarin, COPD recently weaned off home oxygen, right upper lobe lung cancer status post neoadjuvant chemoradiation status post resection in 2009, remote history of pulmonary embolism, hyperlipidemia, diabetes on oral hypoglycemics, hypertension who comes in with 3 days of hemoptysis.  In the ER patient was found to have A. fib with RVR, leukocytosis elevated creatinine and chest x-ray with infiltrates.  Started on antibiotics and admitted for further evaluation.  Subjective: Patient seen and examined, he reported feeling better, cough has improved.  No more hemoptysis.  Hemoglobin remained stable   Assessment & Plan: Hemoptysis secondary to pneumonia/bronchiectasis CT scan of the chest with and opacity of the left lower lung.  Pulmonology was consulted felt to be secondary to underlying pneumonia.  Coumadin was initially on hold, okay to resume and observe. Continue treatment for pneumonia with azithromycin and ceftriaxone. Hemoglobin stable Continue supportive treatment  Chronic atrial fibrillation Coumadin was on hold can resume today Continue metoprolol and diltiazem Heart rate well controlled INR 1.6, goal 2-3  Hypothyroidism Continue Synthroid  History of COPD Currently on bronchodilators Stable no signs of wheezing  Acute renal injury Felt to be prerenal Holding lisinopril and diuretics Encourage oral hydration Creatinine back to baseline Monitor BMP and avoid nephrotoxins  DVT prophylaxis: Coumadin Code Status: Full code Family Communication: Discussed with daughter at bedside Disposition Plan: Home in a.m. if continues to be stable  Consultants:   Pulmonology  Procedures:    None  Antimicrobials: Anti-infectives (From admission, onward)   Start     Dose/Rate Route Frequency Ordered Stop   04/26/17 1515  cefTRIAXone (ROCEPHIN) 1 g in dextrose 5 % 50 mL IVPB     1 g 100 mL/hr over 30 Minutes Intravenous Every 24 hours 04/26/17 1509     04/26/17 1515  azithromycin (ZITHROMAX) 500 mg in dextrose 5 % 250 mL IVPB     500 mg 250 mL/hr over 60 Minutes Intravenous Every 24 hours 04/26/17 1509     04/26/17 1345  cefTRIAXone (ROCEPHIN) 1 g in dextrose 5 % 50 mL IVPB     1 g 100 mL/hr over 30 Minutes Intravenous  Once 04/26/17 1343 04/26/17 1550   04/26/17 1345  azithromycin (ZITHROMAX) 500 mg in dextrose 5 % 250 mL IVPB     500 mg 250 mL/hr over 60 Minutes Intravenous  Once 04/26/17 1343 04/26/17 1619           Objective: Vitals:   04/28/17 0645 04/28/17 0953 04/28/17 0954 04/28/17 1344  BP: 113/72 119/74  114/68  Pulse: 61 73  61  Resp: 18 20  20   Temp: 98.6 F (37 C) 97.6 F (36.4 C)  (!) 97.4 F (36.3 C)  TempSrc: Oral Oral  Oral  SpO2: 95% 96% 95% 96%  Weight:      Height:        Intake/Output Summary (Last 24 hours) at 04/28/2017 1713 Last data filed at 04/28/2017 1457 Gross per 24 hour  Intake 480 ml  Output 675 ml  Net -195 ml   Filed Weights   04/26/17 1017 04/26/17 1845  Weight: 78.9 kg (174 lb) 80.4 kg (177 lb 4 oz)    Examination:  General exam: Appears calm and comfortable  HEENT: PERRLA, OP moist and clear Respiratory system: Decreased breath sounds on  the left side, crackles left lower lobe Cardiovascular system: S1 & S2 heard, RRR. No JVD, murmurs, rubs or gallops Gastrointestinal system: Abdomen is nondistended, soft and nontender. Central nervous system: Alert and oriented. No focal neurological deficits. Extremities: No pedal edema.  Skin: No rashes Psychiatry: Mood & affect appropriate.    Data Reviewed: I have personally reviewed following labs and imaging studies  CBC: Recent Labs  Lab 04/26/17 1040  04/27/17 0346 04/28/17 0249  WBC 16.3* 13.2* 11.2*  HGB 16.3 14.6 14.0  HCT 47.5 43.3 42.5  MCV 92.6 91.5 93.6  PLT 228 208 300   Basic Metabolic Panel: Recent Labs  Lab 04/26/17 1040 04/27/17 0346 04/28/17 0249  NA 139 139 139  K 3.0* 2.8* 3.4*  CL 103 104 103  CO2 22 21* 23  GLUCOSE 117* 99 129*  BUN 30* 29* 27*  CREATININE 1.62* 1.38* 1.39*  CALCIUM 8.6* 8.0* 8.1*   GFR: Estimated Creatinine Clearance: 46.6 mL/min (A) (by C-G formula based on SCr of 1.39 mg/dL (H)). Liver Function Tests: No results for input(s): AST, ALT, ALKPHOS, BILITOT, PROT, ALBUMIN in the last 168 hours. No results for input(s): LIPASE, AMYLASE in the last 168 hours. No results for input(s): AMMONIA in the last 168 hours. Coagulation Profile: Recent Labs  Lab 04/26/17 1040 04/27/17 0346 04/28/17 0249  INR 2.12 1.86 1.62   Cardiac Enzymes: No results for input(s): CKTOTAL, CKMB, CKMBINDEX, TROPONINI in the last 168 hours. BNP (last 3 results) No results for input(s): PROBNP in the last 8760 hours. HbA1C: No results for input(s): HGBA1C in the last 72 hours. CBG: Recent Labs  Lab 04/26/17 1307  GLUCAP 92   Lipid Profile: No results for input(s): CHOL, HDL, LDLCALC, TRIG, CHOLHDL, LDLDIRECT in the last 72 hours. Thyroid Function Tests: No results for input(s): TSH, T4TOTAL, FREET4, T3FREE, THYROIDAB in the last 72 hours. Anemia Panel: No results for input(s): VITAMINB12, FOLATE, FERRITIN, TIBC, IRON, RETICCTPCT in the last 72 hours. Sepsis Labs: Recent Labs  Lab 04/26/17 1425  LATICACIDVEN 1.53    Recent Results (from the past 240 hour(s))  Culture, blood (routine x 2)     Status: None (Preliminary result)   Collection Time: 04/26/17  2:05 PM  Result Value Ref Range Status   Specimen Description BLOOD RIGHT ANTECUBITAL  Final   Special Requests   Final    IN PEDIATRIC BOTTLE Blood Culture results may not be optimal due to an excessive volume of blood received in culture  bottles   Culture   Final    NO GROWTH 2 DAYS Performed at [facility], [place] [address], [place], [place] [address]    Report Status PENDING  Incomplete  Culture, blood (routine x 2)     Status: None (Preliminary result)   Collection Time: 04/26/17  3:07 PM  Result Value Ref Range Status   Specimen Description BLOOD RIGHT ARM  Final   Special Requests   Final    IN PEDIATRIC BOTTLE Blood Culture results may not be optimal due to an excessive volume of blood received in culture bottles   Culture   Final    NO GROWTH 2 DAYS Performed at [facility], [place] [address] [place], [place] [address]    Report Status PENDING  Incomplete  Respiratory Panel by PCR     Status: None   Collection Time: 04/27/17  3:56 PM  Result Value Ref Range Status   Adenovirus NOT DETECTED NOT DETECTED Final   Coronavirus  229E NOT DETECTED NOT DETECTED Final   Coronavirus HKU1 NOT DETECTED NOT DETECTED Final   Coronavirus NL63 NOT DETECTED NOT DETECTED Final   Coronavirus OC43 NOT DETECTED NOT DETECTED Final   Metapneumovirus NOT DETECTED NOT DETECTED Final   Rhinovirus / Enterovirus NOT DETECTED NOT DETECTED Final   Influenza A NOT DETECTED NOT DETECTED Final   Influenza A H1 NOT DETECTED NOT DETECTED Final   Influenza A H1 2009 NOT DETECTED NOT DETECTED Final   Influenza A H3 NOT DETECTED NOT DETECTED Final   Influenza B NOT DETECTED NOT DETECTED Final   Parainfluenza Virus 1 NOT DETECTED NOT DETECTED Final   Parainfluenza Virus 2 NOT DETECTED NOT DETECTED Final   Parainfluenza Virus 3 NOT DETECTED NOT DETECTED Final   Parainfluenza Virus 4 NOT DETECTED NOT DETECTED Final   Respiratory Syncytial Virus NOT DETECTED NOT DETECTED Final   Bordetella pertussis NOT DETECTED NOT DETECTED Final   Chlamydophila pneumoniae NOT DETECTED NOT DETECTED Final   Mycoplasma pneumoniae NOT DETECTED NOT DETECTED Final    Comment: Performed at [facility], [place] [address], [place], [place] [address]       Radiology Studies: No results found.    Scheduled Meds: . atorvastatin  20 mg Oral Daily  . diltiazem  360 mg Oral Daily  . levothyroxine  112 mcg Oral QAC breakfast  . metoprolol tartrate  12.5 mg Oral BID  . mometasone-formoterol  2 puff Inhalation BID  . omega-3 acid ethyl esters  1 g Oral Daily  . pantoprazole  40 mg Oral Daily   Continuous Infusions: . azithromycin 500 mg (04/28/17 1535)  . cefTRIAXone (ROCEPHIN)  IV Stopped (04/28/17 1703)     LOS: 0 days    Time spent: Total of 15 minutes spent with pt, greater than 50% of which was spent in discussion of  treatment, counseling and coordination of care    Chipper Oman, MD Pager: Text Page via [email or web address]   If 7PM-7AM, please contact night-coverage [email or web address] 04/28/2017, 5:13 PM

## 2017-04-29 ENCOUNTER — Other Ambulatory Visit: Payer: Self-pay

## 2017-04-29 DIAGNOSIS — I482 Chronic atrial fibrillation: Secondary | ICD-10-CM

## 2017-04-29 DIAGNOSIS — R042 Hemoptysis: Secondary | ICD-10-CM

## 2017-04-29 LAB — PROTIME-INR
INR: 1.55
Prothrombin Time: 18.5 seconds — ABNORMAL HIGH (ref 11.4–15.2)

## 2017-04-29 MED ORDER — WARFARIN SODIUM 7.5 MG PO TABS: 7.5000 mg | ORAL_TABLET | Freq: Once | ORAL | Status: DC

## 2017-04-29 MED ORDER — AMOXICILLIN-POT CLAVULANATE 875-125 MG PO TABS: 1.0000 | ORAL_TABLET | Freq: Two times a day (BID) | ORAL | 0 refills | Status: AC

## 2017-04-29 NOTE — Progress Notes (Signed)
ANTICOAGULATION CONSULT NOTE Pharmacy Consult for warfarin  Indication: atrial fibrillation  No Known Allergies  Patient Measurements: Height: 6\' 2"  (188 cm) Weight: 177 lb 4 oz (80.4 kg) IBW/kg (Calculated) : 82.2  Vital Signs: Temp: 97.5 F (36.4 C) (02/10 0958) Temp Source: Oral (02/10 0958) BP: 123/83 (02/10 0958) Pulse Rate: 101 (02/10 0958)  Labs: Recent Labs    04/27/17 0346 04/28/17 0249 04/29/17 0458  HGB 14.6 14.0  --   HCT 43.3 42.5  --   PLT 208 225  --   LABPROT 21.3* 19.1* 18.5*  INR 1.86 1.62 1.55  CREATININE 1.38* 1.39*  --    Assessment: 84 yo male on warfarin PTA for hx atrial fibrillation admitted with concern for PNA and hemoptysis. Warfarin initially held but resumed 2/9 pm. Today, INR 1.55, with stable CBC and further hemoptysis.   PTA dose: 7.5 mg on TTS and 5 mg AOD's with last clinic visit on 04/17/17.  Goal of Therapy:  INR 2-3 Monitor platelets by anticoagulation protocol: Yes   Plan:  1. Warfarin 7.5 mg x 1 this evening  2. Daily INR   Vincenza Hews, PharmD, BCPS 04/29/2017, 10:58 AM

## 2017-04-29 NOTE — Progress Notes (Signed)
Nurse went over discharge with pt. Patient Verbalized understanding of discharge.   Discharging home.All belongings sent with patient . Taken down in a wheelchair.

## 2017-04-29 NOTE — Evaluation (Signed)
Physical Therapy Evaluation Patient Details Name: Justin Garner MRN: [id number] DOB: 20-Aug-1932 Today's Date: 04/29/2017   History of Present Illness  Pt is an 84 y.o. male with medical history significant of paroxysmal atrial fibrillation on warfarin, COPD recently weaned off home oxygen, right upper lobe lung cancer status post neoadjuvant chemoradiation status post resection in 2009, remote history of pulmonary embolism, hyperlipidemia, diabetes on oral hypoglycemics, and hypertension. He present with 3 days of hemoptysis.    Clinical Impression  PT eval complete. Pt is modified independent with bed mobility and transfers. Supervision provided for ambulation 150 feet with cane. Daughter present in room. Pt and daughter feel pt is at his baseline with mobility. SpO2 88% after ambulation. Encouraged pt to utilize his home O2. See below for further eval details. Daughter reports, pt's wife passed away in March 07, 2023 and pt has been struggling to recover. No further PT services indicated. Pt to d/c home today. PT signing off.     Follow Up Recommendations No PT follow up;Supervision - Intermittent    Equipment Recommendations  None recommended by PT    Recommendations for Other Services       Precautions / Restrictions Precautions Precautions: Other (comment) Precaution Comments: watch sats      Mobility  Bed Mobility Overal bed mobility: Modified Independent                Transfers Overall transfer level: Modified independent Equipment used: Straight cane                Ambulation/Gait Ambulation/Gait assistance: Supervision Ambulation Distance (Feet): 150 Feet Assistive device: Straight cane Gait Pattern/deviations: Step-through pattern;Decreased stride length Gait velocity: decreased   General Gait Details: slow, steady gait. Supervision for safety. No physical assist needed. No LOB noted. SpO2 88% during ambulation on RA.   Stairs             Wheelchair Mobility    Modified Rankin (Stroke Patients Only)       Balance Overall balance assessment: Needs assistance Sitting-balance support: No upper extremity supported;Feet supported Sitting balance-Leahy Scale: Good     Standing balance support: Single extremity supported;During functional activity Standing balance-Leahy Scale: Fair                               Pertinent Vitals/Pain Pain Assessment: No/denies pain    Home Living Family/patient expects to be discharged to:: Private residence Living Arrangements: Children Available Help at Discharge: Family;Available 24 hours/day Type of Home: Mobile home Home Access: Stairs to enter Entrance Stairs-Rails: Right;Left;Can reach both Entrance Stairs-Number of Steps: 3 Home Layout: One level Home Equipment: Walker - 2 wheels;Cane - single point      Prior Function Level of Independence: Independent with assistive device(s)         Comments: ambulates with cane. Independent with ADLs.     Hand Dominance   Dominant Hand: Right    Extremity/Trunk Assessment   Upper Extremity Assessment Upper Extremity Assessment: Overall WFL for tasks assessed    Lower Extremity Assessment Lower Extremity Assessment: Overall WFL for tasks assessed    Cervical / Trunk Assessment Cervical / Trunk Assessment: Kyphotic  Communication   Communication: HOH  Cognition Arousal/Alertness: Awake/alert Behavior During Therapy: WFL for tasks assessed/performed Overall Cognitive Status: Within Functional Limits for tasks assessed  General Comments      Exercises     Assessment/Plan    PT Assessment Patent does not need any further PT services  PT Problem List         PT Treatment Interventions      PT Goals (Current goals can be found in the Care Plan section)  Acute Rehab PT Goals Patient Stated Goal: home PT Goal Formulation: All assessment  and education complete, DC therapy    Frequency     Barriers to discharge        Co-evaluation               AM-PAC PT "6 Clicks" Daily Activity  Outcome Measure Difficulty turning over in bed (including adjusting bedclothes, sheets and blankets)?: None Difficulty moving from lying on back to sitting on the side of the bed? : None Difficulty sitting down on and standing up from a chair with arms (e.g., wheelchair, bedside commode, etc,.)?: A Little Help needed moving to and from a bed to chair (including a wheelchair)?: None Help needed walking in hospital room?: None Help needed climbing 3-5 steps with a railing? : A Little 6 Click Score: 22    End of Session Equipment Utilized During Treatment: Gait belt Activity Tolerance: Patient tolerated treatment well Patient left: in bed;with call bell/phone within reach;with family/visitor present Nurse Communication: Mobility status PT Visit Diagnosis: Difficulty in walking, not elsewhere classified (R26.2)    Time: 6599-3570 PT Time Calculation (min) (ACUTE ONLY): 16 min   Charges:   PT Evaluation $PT Eval Low Complexity: 1 Low     PT G Codes:        Lorrin Goodell, PT  Office # [phone] Pager [phone]   Lorriane Shire 04/29/2017, 12:17 PM

## 2017-04-29 NOTE — Discharge Summary (Signed)
Physician Discharge Summary  Justin Garner  NGE:[id number]  DOB: Dec 14, 1932  DOA: 04/26/2017 PCP: [name], MD  Admit date: 04/26/2017 Discharge date: 04/29/2017  Admitted From: Home  Disposition: Home   Recommendations for Outpatient Follow-up:  1. Follow up with PCP in 1-2 weeks  2. Follow up with cardiology in 2 weeks  3. Check INR in 3 days  4. Please obtain BMP/CBC in one week to monitor Cr and Hgb 5. Repeat CXR in 6-8 weeks to assure resolution   Home Health: PT/OT   Discharge Condition: Stable   CODE STATUS: Full code  Diet recommendation: Heart Healthy    Brief/Interim Summary: For full details see H&P/Progress note, but in brief, Justin Garner is a 84 y.o.malewith medical history significant ofparoxysmal atrial fibrillation on warfarin, COPD recently weaned off home oxygen, right upper lobe lung cancer status post neoadjuvant chemoradiation status post resection in 2009, remote history of pulmonary embolism, hyperlipidemia, diabetes on oral hypoglycemics, hypertension who comes in with 3 days of hemoptysis.  In the ED was found to have A. fib with RVR, leukocytosis and chest x-ray showed left lower lobe infiltrates, CT scan of the chest showed left lower lung opacity consistent with pneumonia.  Patient was started on azithromycin and ceftriaxone, Coumadin was held.  Pulmonology was consulted and felt hemostasis was secondary to pneumonia/bronchiectasis and recommended continue supportive treatment.  Patient clinically improved, did not have any more bleeding episode.  He remains in room air with a cough improvement therefore patient deemed stable for discharge to follow-up with PCP as an outpatient.  Subjective: Patient seen and examined, he continues to do well.  Denies any chest pain, shortness of breath, palpitations and dizziness.  Cough has improved.  No more hemoptysis.  Patient remains afebrile  Discharge Diagnoses/Hospital Course:  Hemoptysis secondary to  pneumonia/bronchiectasis CT scan of the chest with and opacity of the left lower lung.  Pulmonology was consulted felt to be secondary to underlying pneumonia.  Coumadin was initially on hold, Coumadin was resumed, with further bleeding episodes. Patient was treated with ceftriaxone and azithromycin -finish a course of azithromycin times 3 days, will discharge on Augmentin to complete 7 days. Hemoglobin remains stable Continue supportive treatment, Mucinex for cough, albuterol as needed wheezing.  Left lower lung pneumonia (CAP) See above  Chronic atrial fibrillation Coumadin initially held due to bleeding, has been resume.  No need to bridge as patient was off Coumadin for only 3 days.  INR upon discharge 1.55, advised to follow-up with Coumadin clinic in 3 days to monitor INR levels, goal 2-3 Continue metoprolol and diltiazem Heart rate well controlled  Hypothyroidism Continue Synthroid  History of COPD No wheezing, stable Continue bronchodilators  Acute renal injury Felt to be prerenal Okay to resume Lasix and lisinopril Encourage oral hydration Creatinine back to baseline Check BMP in 1 week  All other chronic medical condition were stable during the hospitalization.  On the day of the discharge the patient's vitals were stable, and no other acute medical condition were reported by patient. the patient was felt safe to be discharge to home.   Discharge Instructions  You were cared for by a hospitalist during your hospital stay. If you have any questions about your discharge medications or the care you received while you were in the hospital after you are discharged, you can call the unit and asked to speak with the hospitalist on call if the hospitalist that took care of you is not available. Once you are  discharged, your primary care physician will handle any further medical issues. Please note that NO REFILLS for any discharge medications will be authorized once you are  discharged, as it is imperative that you return to your primary care physician (or establish a relationship with a primary care physician if you do not have one) for your aftercare needs so that they can reassess your need for medications and monitor your lab values.  Discharge Instructions    Call MD for:  difficulty breathing, headache or visual disturbances   Complete by:  As directed    Call MD for:  extreme fatigue   Complete by:  As directed    Call MD for:  hives   Complete by:  As directed    Call MD for:  persistant dizziness or light-headedness   Complete by:  As directed    Call MD for:  persistant nausea and vomiting   Complete by:  As directed    Call MD for:  redness, tenderness, or signs of infection (pain, swelling, redness, odor or green/yellow discharge around incision site)   Complete by:  As directed    Call MD for:  severe uncontrolled pain   Complete by:  As directed    Call MD for:  temperature >100.4   Complete by:  As directed    Diet - low sodium heart healthy   Complete by:  As directed    Increase activity slowly   Complete by:  As directed      Allergies as of 04/29/2017   No Known Allergies     Medication List    STOP taking these medications   potassium chloride 10 MEQ tablet Commonly known as:  K-DUR     TAKE these medications   albuterol 108 (90 Base) MCG/ACT inhaler Commonly known as:  PROVENTIL HFA;VENTOLIN HFA Inhale 2 puffs into the lungs every 6 (six) hours as needed for wheezing or shortness of breath.   amoxicillin-clavulanate 875-125 MG tablet Commonly known as:  AUGMENTIN Take 1 tablet by mouth every 12 (twelve) hours for 7 days.   atorvastatin 20 MG tablet Commonly known as:  LIPITOR Take 20 mg by mouth daily.   budesonide-formoterol 80-4.5 MCG/ACT inhaler Commonly known as:  SYMBICORT Inhale 2 puffs into the lungs every morning.   dextromethorphan-guaiFENesin 30-600 MG 12hr tablet Commonly known as:  MUCINEX DM Take 1  tablet by mouth 2 (two) times daily as needed for cough.   diltiazem 360 MG 24 hr capsule Commonly known as:  CARDIZEM CD Take 1 capsule (360 mg total) by mouth daily.   DRY EYES OP Place 1 drop into both eyes as needed (dry eye).   fish oil-omega-3 fatty acids 1000 MG capsule Take 1 g by mouth daily.   furosemide 20 MG tablet Commonly known as:  LASIX Take 2 tablets (40 mg total) by mouth daily.   glipiZIDE 10 MG 24 hr tablet Commonly known as:  GLUCOTROL XL Take 10 mg by mouth daily.   levothyroxine 112 MCG tablet Commonly known as:  SYNTHROID, LEVOTHROID Take 112 mcg by mouth daily.   lisinopril 5 MG tablet Commonly known as:  PRINIVIL,ZESTRIL Take 0.5 tablets (2.5 mg total) by mouth daily.   metoprolol tartrate 25 MG tablet Commonly known as:  LOPRESSOR Take 0.5 tablets (12.5 mg total) by mouth 2 (two) times daily.   nitroGLYCERIN 0.4 MG SL tablet Commonly known as:  NITROSTAT Place 1 tablet (0.4 mg total) under the tongue every 5 (five) minutes  as needed for chest pain up to 3 doses   omeprazole 20 MG capsule Commonly known as:  PRILOSEC Take 20 mg by mouth daily.   senna-docusate 8.6-50 MG tablet Commonly known as:  Senokot-S Take 1 tablet by mouth at bedtime as needed for mild constipation. What changed:  when to take this   warfarin 5 MG tablet Commonly known as:  COUMADIN Take as directed. If you are unsure how to take this medication, talk to your nurse or doctor. Original instructions:  Take 5-7.5 mg by mouth See admin instructions. Day 1: 7.5mg  Day 2: 5mg  Day 3: 7.5mg  Day 4: 5mg  Day 5: 7.5mg  Day 6: 5mg  Day 7: 5mg , continue alternating in that order. Next anti-coag visit, 05/15/17. What changed:  Another medication with the same name was removed. Continue taking this medication, and follow the directions you see here.      Follow-up Information    [name], MD. Schedule an appointment as soon as possible for a visit in 1 week(s).    Specialty:  Internal Medicine Why:  Hospital follow-up Contact information: [address] [place] [address] [phone]        [facility] [employer]. Go on 05/02/2017.   Specialty:  Cardiology Why:  Call on Monday  04/30/17 to make an appointment. to check INR level Contact information: [address], [place] [phone]         No Known Allergies  Consultations:  Pulmonary    Procedures/Studies: Dg Chest 2 View  Result Date: 04/26/2017 CLINICAL DATA:  Shortness of breath, hemoptysis EXAM: CHEST  2 VIEW COMPARISON:  12/20/2016 FINDINGS: Emphysema. Bullous changes in the left apex. Scarring in the right base with blunting of the right costophrenic angle likely reflecting pleural thickening. Increased densities in the mid and left lower lung concerning for pneumonia. Heart is borderline in size. IMPRESSION: Bullous emphysema, chronic changes. Increasing density in the mid and lower lung on the left concerning for pneumonia. Electronically Signed   By: Rolm Baptise M.D.   On: 04/26/2017 11:15   Ct Angio Chest Pe W Or Wo Contrast  Result Date: 04/26/2017 CLINICAL DATA:  Shortness of breath and hemoptysis. History of lung cancer in 2009. EXAM: CT ANGIOGRAPHY CHEST WITH CONTRAST TECHNIQUE: Multidetector CT imaging of the chest was performed using the standard protocol during bolus administration of intravenous contrast. Multiplanar CT image reconstructions and MIPs were obtained to evaluate the vascular anatomy. CONTRAST:  43mL ISOVUE-370 IOPAMIDOL (ISOVUE-370) INJECTION 76% COMPARISON:  Chest CT 06/20/2015 FINDINGS: Cardiovascular: Satisfactory opacification of the pulmonary arteries to the segmental level. No evidence of pulmonary embolism. Enlarged heart. No pericardial effusion. Calcific atherosclerotic disease of the coronary arteries and less so aorta. Contrast reflux into the hepatic veins. Mediastinum/Nodes:  Mild anterior mediastinal lymphadenopathy with the most prominent left pretracheal lymph node measuring 12 mm in short axis. Normal appearance of the trachea and esophagus. No thyroid nodules. Lungs/Pleura: Advanced upper lobe predominant emphysematous changes with large bulla formation bilaterally. Stable postsurgical changes from right upper lobectomy. Small right pleural effusion. Diffuse alveolar and interstitial opacities throughout the left lung. Upper Abdomen: No acute abnormality. Musculoskeletal: No chest wall abnormality. No acute or significant osseous findings. Review of the MIP images confirms the above findings. IMPRESSION: Diffuse alveolar and interstitial opacities throughout the left lung with appearance most suggestive of infectious consolidation. Asymmetric pulmonary edema is considered less likely. Stable postsurgical changes from right upper lobectomy. Persistent small right  pleural effusion. Underlying severe upper thorax predominant emphysematous changes with large bulla formation. Mild mediastinal lymphadenopathy. Enlarged heart with probable right heart failure. Heavy calcific atherosclerotic disease of the coronary arteries. No evidence of pulmonary embolus. Aortic Atherosclerosis (ICD10-I70.0) and Emphysema (ICD10-J43.9). Electronically Signed   By: Fidela Salisbury M.D.   On: 04/26/2017 16:38    Discharge Exam: Vitals:   04/29/17 0540 04/29/17 0958  BP: 117/77 123/83  Pulse: (!) 46 (!) 101  Resp: 18 20  Temp: 98.2 F (36.8 C) (!) 97.5 F (36.4 C)  SpO2: 95% 97%   Vitals:   04/28/17 2312 04/29/17 0139 04/29/17 0540 04/29/17 0958  BP:  110/64 117/77 123/83  Pulse:  (!) 56 (!) 46 (!) 101  Resp:  18 18 20   Temp:  98 F (36.7 C) 98.2 F (36.8 C) (!) 97.5 F (36.4 C)  TempSrc:  Oral Oral Oral  SpO2: 96% 98% 95% 97%  Weight:      Height:        General: Pt is alert, awake, not in acute distress Cardiovascular: RRR, S1/S2 +, no rubs, no gallops Respiratory: CTA  bilaterally, no wheezing, no rhonchi Abdominal: Soft, NT, ND, bowel sounds + Extremities: no edema   The results of significant diagnostics from this hospitalization (including imaging, microbiology, ancillary and laboratory) are listed below for reference.     Microbiology: Recent Results (from the past 240 hour(s))  Culture, blood (routine x 2)     Status: None (Preliminary result)   Collection Time: 04/26/17  2:05 PM  Result Value Ref Range Status   Specimen Description BLOOD RIGHT ANTECUBITAL  Final   Special Requests   Final    IN PEDIATRIC BOTTLE Blood Culture results may not be optimal due to an excessive volume of blood received in culture bottles   Culture   Final    NO GROWTH 3 DAYS Performed at [facility], [place] [address], [place], [place] [address]    Report Status PENDING  Incomplete  Culture, blood (routine x 2)     Status: None (Preliminary result)   Collection Time: 04/26/17  3:07 PM  Result Value Ref Range Status   Specimen Description BLOOD RIGHT ARM  Final   Special Requests   Final    IN PEDIATRIC BOTTLE Blood Culture results may not be optimal due to an excessive volume of blood received in culture bottles   Culture   Final    NO GROWTH 3 DAYS Performed at [facility], [place] [address], [place], [place] [address]    Report Status PENDING  Incomplete  Respiratory Panel by PCR     Status: None   Collection Time: 04/27/17  3:56 PM  Result Value Ref Range Status   Adenovirus NOT DETECTED NOT DETECTED Final   Coronavirus 229E NOT DETECTED NOT DETECTED Final   Coronavirus HKU1 NOT DETECTED NOT DETECTED Final   Coronavirus NL63 NOT DETECTED NOT DETECTED Final   Coronavirus OC43 NOT DETECTED NOT DETECTED Final   Metapneumovirus NOT DETECTED NOT DETECTED Final   Rhinovirus / Enterovirus NOT DETECTED NOT DETECTED Final   Influenza A NOT DETECTED NOT DETECTED Final   Influenza A H1 NOT DETECTED NOT DETECTED Final   Influenza A H1 2009 NOT DETECTED NOT  DETECTED Final   Influenza A H3 NOT DETECTED NOT DETECTED Final   Influenza B NOT DETECTED NOT DETECTED Final   Parainfluenza Virus 1 NOT DETECTED NOT DETECTED Final   Parainfluenza Virus 2 NOT DETECTED  NOT DETECTED Final   Parainfluenza Virus 3 NOT DETECTED NOT DETECTED Final   Parainfluenza Virus 4 NOT DETECTED NOT DETECTED Final   Respiratory Syncytial Virus NOT DETECTED NOT DETECTED Final   Bordetella pertussis NOT DETECTED NOT DETECTED Final   Chlamydophila pneumoniae NOT DETECTED NOT DETECTED Final   Mycoplasma pneumoniae NOT DETECTED NOT DETECTED Final    Comment: Performed at [facility], [place] [address] [place], [place] [address]     Labs: BNP (last 3 results) Recent Labs    06/09/16 2357 08/18/16 1428 12/20/16 1942  BNP 249.8* 356.4* 846.9*   Basic Metabolic Panel: Recent Labs  Lab 04/26/17 1040 04/27/17 0346 04/28/17 0249  NA 139 139 139  K 3.0* 2.8* 3.4*  CL 103 104 103  CO2 22 21* 23  GLUCOSE 117* 99 129*  BUN 30* 29* 27*  CREATININE 1.62* 1.38* 1.39*  CALCIUM 8.6* 8.0* 8.1*   Liver Function Tests: No results for input(s): AST, ALT, ALKPHOS, BILITOT, PROT, ALBUMIN in the last 168 hours. No results for input(s): LIPASE, AMYLASE in the last 168 hours. No results for input(s): AMMONIA in the last 168 hours. CBC: Recent Labs  Lab 04/26/17 1040 04/27/17 0346 04/28/17 0249  WBC 16.3* 13.2* 11.2*  HGB 16.3 14.6 14.0  HCT 47.5 43.3 42.5  MCV 92.6 91.5 93.6  PLT 228 208 225   Cardiac Enzymes: No results for input(s): CKTOTAL, CKMB, CKMBINDEX, TROPONINI in the last 168 hours. BNP: Invalid input(s): POCBNP CBG: Recent Labs  Lab 04/26/17 1307  GLUCAP 92   D-Dimer No results for input(s): DDIMER in the last 72 hours. Hgb A1c No results for input(s): HGBA1C in the last 72 hours. Lipid Profile No results for input(s): CHOL, HDL, LDLCALC, TRIG, CHOLHDL, LDLDIRECT in the last 72 hours. Thyroid function studies No results for input(s): TSH,  T4TOTAL, T3FREE, THYROIDAB in the last 72 hours.  Invalid input(s): FREET3 Anemia work up No results for input(s): VITAMINB12, FOLATE, FERRITIN, TIBC, IRON, RETICCTPCT in the last 72 hours. Urinalysis    Component Value Date/Time   COLORURINE YELLOW 08/18/2016 1509   APPEARANCEUR CLEAR 08/18/2016 1509   LABSPEC 1.017 08/18/2016 1509   PHURINE 5.0 08/18/2016 1509   GLUCOSEU NEGATIVE 08/18/2016 1509   HGBUR NEGATIVE 08/18/2016 1509   BILIRUBINUR NEGATIVE 08/18/2016 1509   KETONESUR NEGATIVE 08/18/2016 1509   PROTEINUR 30 (A) 08/18/2016 1509   UROBILINOGEN 4.0 (H) 12/27/2013 2225   NITRITE NEGATIVE 08/18/2016 1509   LEUKOCYTESUR NEGATIVE 08/18/2016 1509   Sepsis Labs Invalid input(s): PROCALCITONIN,  WBC,  LACTICIDVEN Microbiology Recent Results (from the past 240 hour(s))  Culture, blood (routine x 2)     Status: None (Preliminary result)   Collection Time: 04/26/17  2:05 PM  Result Value Ref Range Status   Specimen Description BLOOD RIGHT ANTECUBITAL  Final   Special Requests   Final    IN PEDIATRIC BOTTLE Blood Culture results may not be optimal due to an excessive volume of blood received in culture bottles   Culture   Final    NO GROWTH 3 DAYS Performed at [facility], [place] [address] [place], [place] [address]    Report Status PENDING  Incomplete  Culture, blood (routine x 2)     Status: None (Preliminary result)   Collection Time: 04/26/17  3:07 PM  Result Value Ref Range Status   Specimen Description BLOOD RIGHT ARM  Final   Special Requests   Final    IN PEDIATRIC BOTTLE Blood Culture  results may not be optimal due to an excessive volume of blood received in culture bottles   Culture   Final    NO GROWTH 3 DAYS Performed at [facility], [place] [address], [place], [place] [address]    Report Status PENDING  Incomplete  Respiratory Panel by PCR     Status: None   Collection Time: 04/27/17  3:56 PM  Result Value Ref Range Status   Adenovirus NOT  DETECTED NOT DETECTED Final   Coronavirus 229E NOT DETECTED NOT DETECTED Final   Coronavirus HKU1 NOT DETECTED NOT DETECTED Final   Coronavirus NL63 NOT DETECTED NOT DETECTED Final   Coronavirus OC43 NOT DETECTED NOT DETECTED Final   Metapneumovirus NOT DETECTED NOT DETECTED Final   Rhinovirus / Enterovirus NOT DETECTED NOT DETECTED Final   Influenza A NOT DETECTED NOT DETECTED Final   Influenza A H1 NOT DETECTED NOT DETECTED Final   Influenza A H1 2009 NOT DETECTED NOT DETECTED Final   Influenza A H3 NOT DETECTED NOT DETECTED Final   Influenza B NOT DETECTED NOT DETECTED Final   Parainfluenza Virus 1 NOT DETECTED NOT DETECTED Final   Parainfluenza Virus 2 NOT DETECTED NOT DETECTED Final   Parainfluenza Virus 3 NOT DETECTED NOT DETECTED Final   Parainfluenza Virus 4 NOT DETECTED NOT DETECTED Final   Respiratory Syncytial Virus NOT DETECTED NOT DETECTED Final   Bordetella pertussis NOT DETECTED NOT DETECTED Final   Chlamydophila pneumoniae NOT DETECTED NOT DETECTED Final   Mycoplasma pneumoniae NOT DETECTED NOT DETECTED Final    Comment: Performed at [facility], [place] [address], [place],  [address]     Time coordinating discharge: 30 minutes  SIGNED:  Chipper Oman, MD  [facility] 04/29/2017, 11:05 AM  Pager please text page via  [email or web address]

## 2017-05-01 LAB — CULTURE, BLOOD (ROUTINE X 2)
Culture: NO GROWTH
Culture: NO GROWTH

## 2017-05-07 ENCOUNTER — Telehealth: Payer: Self-pay

## 2017-05-07 NOTE — Telephone Encounter (Signed)
Received INR results from Dr Polite's office from 05/04/16 INR 4.8.  Instructions stated to have pt hold Coumadin tonight, then forward result to cardiology. Received results on 05/07/17, called pt, pt states it was his understanding per Dr Lina Sar nurse to hold Coumadin until 05/08/17 OV with Melina Copa.  Pt has been holding Coumadin since 05/04/17 (has held x 3 dosages) advised pt to resume Coumadin tonight at previous dosage regimen 5mg  qd except 7.5mg  Tu,Th,Sa.  Made appt to check INR tomorrow 05/08/17 while in office seeing Dayna Dunna at 3:30pm.

## 2017-05-07 NOTE — Progress Notes (Signed)
Cardiology Office Note:    Date:  05/08/2017   ID:  Justin Garner, DOB 1932/06/13, MRN [id number]  PCP:  [name], MD  Cardiologist:  Larae Grooms, MD   Referring MD: [name], MD   Chief Complaint  Patient presents with  . Hospitalization Follow-up    atrial fibrillation    History of Present Illness:    Justin Garner is a 84 y.o. male with a hx of chronic Afib on coumadin, COPD (previously on home O2), s/p RUL (2009), hx of PE, HLD, DM, CAD (stents LAD and circumflex in 2003. Heart cath in 2015 with no significant CAD), and HTN.  He was last seen by Dr. Irish Lack on 12/28/16. That was a follow up visit after a hospitalization for PNA. At that time, he was maintaining good rate control on lopressor and diltiazem. He is on chronic coumadin. He was doing well at that visit, no medication changes made.   He was recently hospitalized (04/26/17 - 04/29/17) with hemoptysis and found to be in Afib RVR in the setting of leukocytosis and suspected pulmonary disease. He was treated witsh IV ABX for CAP and discharged on his home medications. HR improved with treatment of the underlying PNA. Cardiology was not formerly consulted during this admission.  He presents today for hospital follow up. EKG with rate controlled Afib. He is doing well on his home diltiazem and lopressor. His INR today is 2.0. He denies bleeding problems.  He denies chest pain, shortness of breath, lower extremity swelling, and orthopnea. He is feeling much better since his discharge and has finished his antibiotics. He is compliant on all medications. He has no complaints.   Past Medical History:  Diagnosis Date  . ASCVD (arteriosclerotic cardiovascular disease)   . Cancer [facility]) 2009   Upper lobectomy;radiation; chemotherapy  . Chronic systolic CHF (congestive heart failure) ([place])   . CKD (chronic kidney disease), stage II   . COPD (chronic obstructive pulmonary disease) ([place])   . Coronary artery  disease   . Diabetes mellitus   . GERD (gastroesophageal reflux disease)   . Hyperlipidemia   . Hypertension   . Leukocytosis 07/12/2015  . PAF (paroxysmal atrial fibrillation) ([facility])    Chronic warfarin therapy  . Primary cancer of right upper lobe of lung ([place]) 06/30/2013   RUL lobectomy 2009 with chest wall resection for superior sulcus tumor Postoperative radiation and chemotherapy; Dr Earlie Server Oncology monitor [phone]  . Pulmonary embolism (H[facility])    PMH of  . Thyroid disease     Past Surgical History:  Procedure Laterality Date  . CAROTID STENT    . CHOLECYSTECTOMY    . FIBEROPTIC BRONCHOSCOPY AND MEIASTINOSCOPY  06/07/2007  . HERNIA REPAIR    . INGUINAL HERNIA REPAIR  10/10/2010  . LEFT HEART CATHETERIZATION WITH CORONARY ANGIOGRAM N/A 08/19/2013   Procedure: LEFT HEART CATHETERIZATION WITH CORONARY ANGIOGRAM;  Surgeon: Jettie Booze, MD;  Location: [facility] CATH LAB;  Service: Cardiovascular;  Laterality: N/A;  . R VATS,THORACOTOMY AND UPPER LOBECTOMY  08/02/2007    Current Medications: Current Meds  Medication Sig  . albuterol (PROVENTIL HFA;VENTOLIN HFA) 108 (90 Base) MCG/ACT inhaler Inhale 2 puffs into the lungs every 6 (six) hours as needed for wheezing or shortness of breath.  . Artificial Tear Ointment (DRY EYES OP) Place 1 drop into both eyes as needed (dry eye).  Marland Kitchen atorvastatin (LIPITOR) 20 MG tablet Take 20 mg by mouth daily.  . budesonide-formoterol (SYMBICORT) 80-4.5 MCG/ACT inhaler Inhale 2 puffs  into the lungs every morning.  Marland Kitchen dextromethorphan-guaiFENesin (MUCINEX DM) 30-600 MG 12hr tablet Take 1 tablet by mouth 2 (two) times daily as needed for cough.  . diltiazem (CARDIZEM CD) 360 MG 24 hr capsule Take 1 capsule (360 mg total) by mouth daily.  . fish oil-omega-3 fatty acids 1000 MG capsule Take 1 g by mouth daily.   . furosemide (LASIX) 20 MG tablet Take 2 tablets (40 mg total) by mouth daily.  Marland Kitchen glipiZIDE (GLUCOTROL XL) 10 MG 24 hr tablet Take 10 mg by mouth  daily.  Marland Kitchen levothyroxine (SYNTHROID, LEVOTHROID) 112 MCG tablet Take 112 mcg by mouth daily.    Marland Kitchen lisinopril (PRINIVIL,ZESTRIL) 5 MG tablet Take 0.5 tablets (2.5 mg total) by mouth daily.  . metoprolol tartrate (LOPRESSOR) 25 MG tablet Take 0.5 tablets (12.5 mg total) by mouth 2 (two) times daily.  . nitroGLYCERIN (NITROSTAT) 0.4 MG SL tablet Place 1 tablet (0.4 mg total) under the tongue every 5 (five) minutes as needed for chest pain up to 3 doses  . omeprazole (PRILOSEC) 20 MG capsule Take 20 mg by mouth daily.   . sennosides-docusate sodium (SENOKOT-S) 8.6-50 MG tablet Take 1 tablet by mouth daily. As needed for constipation  . warfarin (COUMADIN) 5 MG tablet Take 5-7.5 mg by mouth See admin instructions. Day 1: 7.60m Day 2: 512mDay 3: 7.16m41may 4: 16mg11my 5: 7.16mg 63m 6: 16mg D72m7: 16mg, c37minue alternating in that order. Next anti-coag visit, 05/15/17.     Allergies:   Patient has no known allergies.   Social History   Socioeconomic History  . Marital status: Married    Spouse name: None  . Number of children: None  . Years of education: None  . Highest education level: None  Social Needs  . Financial resource strain: None  . Food insecurity - worry: None  . Food insecurity - inability: None  . Transportation needs - medical: None  . Transportation needs - non-medical: None  Occupational History  . Occupation: retired  Tobacco Use  . Smoking status: Former Smoker    Packs/day: 1.00    Years: 50.00    Pack years: 50.00    Types: Cigarettes    Last attempt to quit: 03/20/2001    Years since quitting: 16.1  . Smokeless tobacco: Former User  SNetwork engineerxual Activity  . Alcohol use: No  . Drug use: No  . Sexual activity: None  Other Topics Concern  . None  Social History Narrative  . None     Family History: The patient's family history includes Diabetes in his brother; Heart attack in his father; Heart disease in his father. There is no history of Cancer,  Stroke, or Hypertension.  ROS:   Please see the history of present illness.    All other systems reviewed and are negative.  EKGs/Labs/Other Studies Reviewed:    The following studies were reviewed today:  Echo 08/20/16: Study Conclusions - Left ventricle: The cavity size was normal. Wall thickness was   increased in a pattern of mild LVH. Systolic function was   moderately reduced. The estimated ejection fraction was in the   range of 35% to 40%. Moderate diffuse hypokinesis with no   identifiable regional variations. - Mitral valve: Calcified annulus. Mildly thickened leaflets .   There was mild to moderate regurgitation directed centrally. - Left atrium: The atrium was mildly to moderately dilated. - Right ventricle: The cavity size was moderately dilated. Systolic   function was  moderately reduced. - Right atrium: The atrium was severely dilated. - Tricuspid valve: There was moderate regurgitation. - Pulmonary arteries: Systolic pressure was mildly increased. PA   peak pressure: 34 mm Hg (S).   EKG:  EKG is ordered today.  The ekg ordered today demonstrates rate controlled Afib.  Recent Labs: 08/18/2016: TSH 2.180 08/19/2016: ALT 27 08/20/2016: Magnesium 1.6 12/20/2016: B Natriuretic Peptide 231.1 04/28/2017: BUN 27; Creatinine, Ser 1.39; Hemoglobin 14.0; Platelets 225; Potassium 3.4; Sodium 139  Recent Lipid Panel    Component Value Date/Time   CHOL 78 06/15/2015 1147   TRIG 98 06/15/2015 1147   HDL 15 (L) 06/15/2015 1147   CHOLHDL 5.2 06/15/2015 1147   VLDL 20 06/15/2015 1147   LDLCALC 43 06/15/2015 1147    Physical Exam:    VS:  BP 122/86   Pulse 88   Ht _0  (1.88 m)   Wt 173 lb (78.5 kg)   SpO2 97%   BMI 22.21 kg/m     Wt Readings from Last 3 Encounters:  05/08/17 173 lb (78.5 kg)  04/26/17 177 lb 4 oz (80.4 kg)  12/28/16 186 lb 12.8 oz (84.7 kg)     GEN: Well nourished, well developed in no acute distress HEENT: Normal NECK: No JVD; No carotid  bruits LYMPHATICS: No lymphadenopathy CARDIAC: Irregular rhythm, regular rate, no murmurs, rubs, gallops RESPIRATORY:  Respirations unlabored, scattered crackles in bases  ABDOMEN: Soft, non-tender, non-distended MUSCULOSKELETAL:  Trace B LE edema; No deformity  SKIN: Warm and dry NEUROLOGIC:  Alert and oriented x 3 PSYCHIATRIC:  Normal affect   ASSESSMENT and PLAN:   Chronic Atrial fibrillation ([place]), on chronic coumadin  EKG today with rate controlled Afib.  Continue diltiazem, lopressor, and coumadin. INR checked today and was 2.0. Previous notes suggest avoiding beta blockers in the setting of COPD. However, pt is on 12.5 mg lopressor BID and doing well. Will continue this for now. No medication changes. Will check CBC when he returns for coumadin clinic. Hb at discharge was 11.2 (13.2).  Plan to follow up with Dr. Irish Lack in 6 months.  CAD in native artery, (stents to LAD and circumflex in 2003, nonobstructive disease by cath 2015) He denies chest pain. No ASA in the setting of coumadin.   Hyperlipidemia Lipids last checked in Epic 05/2015. He is unsure if his PCP checks his cholesterol. We will obtain fasting lipids and LFTs at next INR check. Continue 20 mg lipitor.   Chronic systolic CHF (congestive heart failure) ([place])  Patient appears euvolemic on exam. He is compliant on his diuretic regimen and avoids salt.  Continue ACEI.  Essential hypertension, benign  Pressures are well-controlled. Encouraged low salt diet.  COPD Pt is feeling much better since discharge. He is satting 97% on room air. Will continue beta blocker as he seems to be tolerating this.  Suspected CKD stage II Patient is making good urine. Plan to check CMP when he returns for INR check and fasting labs.   Medication Adjustments/Labs and Tests Ordered: Current medicines are reviewed at length with the patient today.  Concerns regarding medicines are outlined above.  Orders Placed This Encounter   Procedures  . Lipid panel  . Hepatic function panel  . Comp Met (CMET)  . CBC  . EKG 12-Lead   No orders of the defined types were placed in this encounter.   Signed, Ledora Bottcher, [place]  05/08/2017 4:38 PM    [facility]

## 2017-05-08 ENCOUNTER — Ambulatory Visit (INDEPENDENT_AMBULATORY_CARE_PROVIDER_SITE_OTHER): Payer: Medicare Other | Admitting: Pharmacist

## 2017-05-08 ENCOUNTER — Encounter: Payer: Self-pay | Admitting: Physician Assistant

## 2017-05-08 ENCOUNTER — Ambulatory Visit: Payer: Medicare Other | Admitting: Physician Assistant

## 2017-05-08 VITALS — BP 122/86 | HR 88 | Ht 74.0 in | Wt 173.0 lb

## 2017-05-08 DIAGNOSIS — I10 Essential (primary) hypertension: Secondary | ICD-10-CM

## 2017-05-08 DIAGNOSIS — I482 Chronic atrial fibrillation, unspecified: Secondary | ICD-10-CM

## 2017-05-08 DIAGNOSIS — E785 Hyperlipidemia, unspecified: Secondary | ICD-10-CM

## 2017-05-08 DIAGNOSIS — J42 Unspecified chronic bronchitis: Secondary | ICD-10-CM

## 2017-05-08 DIAGNOSIS — I4891 Unspecified atrial fibrillation: Secondary | ICD-10-CM | POA: Diagnosis not present

## 2017-05-08 DIAGNOSIS — I5022 Chronic systolic (congestive) heart failure: Secondary | ICD-10-CM | POA: Diagnosis not present

## 2017-05-08 DIAGNOSIS — I2510 Atherosclerotic heart disease of native coronary artery without angina pectoris: Secondary | ICD-10-CM

## 2017-05-08 LAB — POCT INR: INR: 2

## 2017-05-08 NOTE — Patient Instructions (Addendum)
Medication Instructions:  Your physician recommends that you continue on your current medications as directed. Please refer to the Current Medication list given to you today.   Labwork: 05/23/17:  FASTING LIPID, LFT, CMP, & CBC  Testing/Procedures: None ordered  Follow-Up: Your physician wants you to follow-up in: [place] DR. VARANASI   You will receive a reminder letter in the mail two months in advance. If you don't receive a letter, please call our office to schedule the follow-up appointment.  Any Other Special Instructions Will Be Listed Below (If Applicable).     If you need a refill on your cardiac medications before your next appointment, please call your pharmacy.

## 2017-05-08 NOTE — Patient Instructions (Addendum)
Description   Continue taking 1 tablet daily except 1.5 tablets on Tuesdays, Thursdays, and Saturdays. Recheck INR in 2 weeks. Call us with any medication changes or concerns # [phone] [facility], Main # [phone].

## 2017-05-09 ENCOUNTER — Other Ambulatory Visit: Payer: Self-pay | Admitting: Interventional Cardiology

## 2017-05-16 ENCOUNTER — Emergency Department (HOSPITAL_COMMUNITY)
Admission: EM | Admit: 2017-05-16 | Discharge: 2017-05-16 | Disposition: A | Discharge status: Home / Self Care | Payer: Medicare Other | Attending: Emergency Medicine | Admitting: Emergency Medicine

## 2017-05-16 ENCOUNTER — Emergency Department (HOSPITAL_COMMUNITY): Payer: Medicare Other

## 2017-05-16 ENCOUNTER — Other Ambulatory Visit: Payer: Self-pay

## 2017-05-16 ENCOUNTER — Encounter (HOSPITAL_COMMUNITY): Payer: Self-pay | Admitting: *Deleted

## 2017-05-16 DIAGNOSIS — R197 Diarrhea, unspecified: Secondary | ICD-10-CM | POA: Insufficient documentation

## 2017-05-16 DIAGNOSIS — Z79899 Other long term (current) drug therapy: Secondary | ICD-10-CM | POA: Insufficient documentation

## 2017-05-16 DIAGNOSIS — E1122 Type 2 diabetes mellitus with diabetic chronic kidney disease: Secondary | ICD-10-CM | POA: Diagnosis not present

## 2017-05-16 DIAGNOSIS — Z87891 Personal history of nicotine dependence: Secondary | ICD-10-CM | POA: Insufficient documentation

## 2017-05-16 DIAGNOSIS — I4891 Unspecified atrial fibrillation: Secondary | ICD-10-CM | POA: Insufficient documentation

## 2017-05-16 DIAGNOSIS — I5022 Chronic systolic (congestive) heart failure: Secondary | ICD-10-CM | POA: Diagnosis not present

## 2017-05-16 DIAGNOSIS — I130 Hypertensive heart and chronic kidney disease with heart failure and stage 1 through stage 4 chronic kidney disease, or unspecified chronic kidney disease: Secondary | ICD-10-CM | POA: Insufficient documentation

## 2017-05-16 DIAGNOSIS — Z7984 Long term (current) use of oral hypoglycemic drugs: Secondary | ICD-10-CM | POA: Diagnosis not present

## 2017-05-16 DIAGNOSIS — R4182 Altered mental status, unspecified: Secondary | ICD-10-CM | POA: Diagnosis not present

## 2017-05-16 DIAGNOSIS — E039 Hypothyroidism, unspecified: Secondary | ICD-10-CM | POA: Insufficient documentation

## 2017-05-16 DIAGNOSIS — R112 Nausea with vomiting, unspecified: Secondary | ICD-10-CM | POA: Diagnosis present

## 2017-05-16 DIAGNOSIS — N182 Chronic kidney disease, stage 2 (mild): Secondary | ICD-10-CM | POA: Insufficient documentation

## 2017-05-16 DIAGNOSIS — J449 Chronic obstructive pulmonary disease, unspecified: Secondary | ICD-10-CM | POA: Diagnosis not present

## 2017-05-16 LAB — CBC WITH DIFFERENTIAL/PLATELET
Basophils Absolute: 0 10*3/uL (ref 0.0–0.1)
Basophils Relative: 0 %
EOS PCT: 1 %
Eosinophils Absolute: 0.1 10*3/uL (ref 0.0–0.7)
HEMATOCRIT: 47.9 % (ref 39.0–52.0)
Hemoglobin: 16.4 g/dL (ref 13.0–17.0)
LYMPHS PCT: 8 %
Lymphs Abs: 0.9 10*3/uL (ref 0.7–4.0)
MCH: 31.7 pg (ref 26.0–34.0)
MCHC: 34.2 g/dL (ref 30.0–36.0)
MCV: 92.5 fL (ref 78.0–100.0)
MONO ABS: 0.5 10*3/uL (ref 0.1–1.0)
MONOS PCT: 4 %
NEUTROS ABS: 10.2 10*3/uL — AB (ref 1.7–7.7)
Neutrophils Relative %: 87 %
PLATELETS: 308 10*3/uL (ref 150–400)
RBC: 5.18 MIL/uL (ref 4.22–5.81)
RDW: 15.3 % (ref 11.5–15.5)
WBC: 11.7 10*3/uL — ABNORMAL HIGH (ref 4.0–10.5)

## 2017-05-16 LAB — URINALYSIS, ROUTINE W REFLEX MICROSCOPIC
BACTERIA UA: NONE SEEN
Bilirubin Urine: NEGATIVE
Glucose, UA: NEGATIVE mg/dL
Hgb urine dipstick: NEGATIVE
Ketones, ur: NEGATIVE mg/dL
LEUKOCYTES UA: NEGATIVE
Nitrite: NEGATIVE
PH: 5 (ref 5.0–8.0)
Protein, ur: 100 mg/dL — AB
SQUAMOUS EPITHELIAL / LPF: NONE SEEN
Specific Gravity, Urine: 1.023 (ref 1.005–1.030)

## 2017-05-16 LAB — COMPREHENSIVE METABOLIC PANEL
ALBUMIN: 3.6 g/dL (ref 3.5–5.0)
ALK PHOS: 159 U/L — AB (ref 38–126)
ALT: 36 U/L (ref 17–63)
AST: 37 U/L (ref 15–41)
Anion gap: 12 (ref 5–15)
BILIRUBIN TOTAL: 2.2 mg/dL — AB (ref 0.3–1.2)
BUN: 28 mg/dL — AB (ref 6–20)
CALCIUM: 8.8 mg/dL — AB (ref 8.9–10.3)
CO2: 22 mmol/L (ref 22–32)
Chloride: 106 mmol/L (ref 101–111)
Creatinine, Ser: 1.28 mg/dL — ABNORMAL HIGH (ref 0.61–1.24)
GFR calc Af Amer: 58 mL/min — ABNORMAL LOW (ref >60)
GFR calc non Af Amer: 50 mL/min — ABNORMAL LOW (ref >60)
GLUCOSE: 139 mg/dL — AB (ref 65–99)
POTASSIUM: 4.1 mmol/L (ref 3.5–5.1)
SODIUM: 140 mmol/L (ref 135–145)
Total Protein: 7.6 g/dL (ref 6.5–8.1)

## 2017-05-16 LAB — I-STAT CG4 LACTIC ACID, ED: Lactic Acid, Venous: 1.48 mmol/L (ref 0.5–1.9)

## 2017-05-16 LAB — PROTIME-INR
INR: 2.16
PROTHROMBIN TIME: 23.9 s — AB (ref 11.4–15.2)

## 2017-05-16 MED ORDER — SODIUM CHLORIDE 0.9 % IV BOLUS (SEPSIS)
500.0000 mL | Freq: Once | INTRAVENOUS | Status: AC
Start: 2017-05-16 — End: 2017-05-16
  Administered 2017-05-16: 500 mL via INTRAVENOUS

## 2017-05-16 NOTE — ED Provider Notes (Signed)
[facility] EMERGENCY DEPARTMENT Provider Note   CSN: [id number] Arrival date & time: 05/16/17  1636     History   Chief Complaint Chief Complaint  Patient presents with  . Emesis  . Neck Pain    HPI Justin Garner is a 84 y.o. male.  HPI  84 year old male with a history of chronic atrial fibrillation on warfarin, prior history of PE, COPD, CHF and chronic kidney disease presents with vomiting and diarrhea.  He was told to come in by his doctor's office because when they called him to talk to him to see how he was doing with the vomiting and diarrhea he is "rambling".  This is told to me by the grandson at the bedside who states he was not there and his mom was there but did not actually see him rambling.  The patient states he feels fine.  The grandson states that he is currently at his mental baseline and has not noticed any confusion when he has been with him.  Patient states he has had multiple episodes of vomiting and diarrhea, with no blood in either.  He denies headache, chest pain, abdominal pain, back pain.  He has chronic left neck pain that is not new or worse today.  He denies dizziness.  Past Medical History:  Diagnosis Date  . ASCVD (arteriosclerotic cardiovascular disease)   . Cancer [facility]) 2009   Upper lobectomy;radiation; chemotherapy  . Chronic systolic CHF (congestive heart failure) ([place])   . CKD (chronic kidney disease), stage II   . COPD (chronic obstructive pulmonary disease) ([place])   . Coronary artery disease   . Diabetes mellitus   . GERD (gastroesophageal reflux disease)   . Hyperlipidemia   . Hypertension   . Leukocytosis 07/12/2015  . PAF (paroxysmal atrial fibrillation) (HCC)    Chronic warfarin therapy  . Primary cancer of right upper lobe of lung ([place]) 06/30/2013   RUL lobectomy 2009 with chest wall resection for superior sulcus tumor Postoperative radiation and chemotherapy; Dr Earlie Server Oncology monitor [phone]  . Pulmonary  embolism (HCC)    PMH of  . Thyroid disease     Patient Active Problem List   Diagnosis Date Noted  . Hemoptysis 04/26/2017  . Lobar pneumonia ([place]) 12/20/2016  . Atrial fibrillation with RVR ([place]) 06/09/2016  . Acute encephalopathy 06/09/2016  . COPD exacerbation ([place]) 11/15/2015  . Acute on chronic kidney failure-II 11/15/2015  . Malnutrition of moderate degree 11/15/2015  . Leukocytosis 07/12/2015  . Chronic obstructive pulmonary disease ([place])   . Cardiomyopathy ([place])   . CAD in native artery   . Hypomagnesemia   . CAP (community acquired pneumonia) 06/13/2015  . Acute on chronic respiratory failure with hypoxia ([place]) 06/13/2015  . Hypothyroidism 06/13/2015  . HLD (hyperlipidemia) 06/13/2015  . Chronic systolic CHF (congestive heart failure) ([place])   . Protein-calorie malnutrition, severe ([place]) 12/29/2013  . Hypokalemia 12/28/2013  . Sepsis ([place]) 12/28/2013  . 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 ([place]) 12/28/2013  . Acute systolic heart failure ([place]) 08/12/2013  . Primary cancer of right upper lobe of lung ([place]) 06/30/2013  . Coronary atherosclerosis of native coronary artery 05/28/2013  . Essential hypertension, benign 05/28/2013  . Chronic atrial fibrillation ([place]) 12/24/2012  . Pulmonary embolism ([place]) 12/24/2012  . COPD with acute bronchitis ([place]) 04/23/2008    Past Surgical History:  Procedure Laterality Date  . CAROTID STENT    . CHOLECYSTECTOMY    . FIBEROPTIC BRONCHOSCOPY AND MEIASTINOSCOPY  06/07/2007  .  HERNIA REPAIR    . INGUINAL HERNIA REPAIR  10/10/2010  . LEFT HEART CATHETERIZATION WITH CORONARY ANGIOGRAM N/A 08/19/2013   Procedure: LEFT HEART CATHETERIZATION WITH CORONARY ANGIOGRAM;  Surgeon: Jettie Booze, MD;  Location: [facility] CATH LAB;  Service: Cardiovascular;  Laterality: N/A;  . R VATS,THORACOTOMY AND UPPER LOBECTOMY  08/02/2007       Home Medications    Prior to Admission medications   Medication Sig Start Date End Date Taking? Authorizing  Provider  albuterol (PROVENTIL HFA;VENTOLIN HFA) 108 (90 Base) MCG/ACT inhaler Inhale 2 puffs into the lungs every 6 (six) hours as needed for wheezing or shortness of breath. 11/16/15  Yes Mikhail, [place], DO  Artificial Tear Ointment (DRY EYES OP) Place 1 drop into both eyes as needed (dry eye).   Yes [provider]  atorvastatin (LIPITOR) 20 MG tablet TAKE 1 TABLET BY MOUTH  DAILY 05/09/17  Yes Jettie Booze, MD  budesonide-formoterol [facility]) 80-4.5 MCG/ACT inhaler Inhale 2 puffs into the lungs every morning. 11/16/15  Yes Mikhail, Velta Addison, DO  dextromethorphan-guaiFENesin (MUCINEX DM) 30-600 MG 12hr tablet Take 1 tablet by mouth 2 (two) times daily as needed for cough. 11/16/15  Yes Mikhail, Velta Addison, DO  diltiazem (CARDIZEM CD) 360 MG 24 hr capsule Take 1 capsule (360 mg total) by mouth daily. 03/22/17  Yes Jettie Booze, MD  fish oil-omega-3 fatty acids 1000 MG capsule Take 1 g by mouth daily.    Yes [provider]  furosemide (LASIX) 20 MG tablet Take 2 tablets (40 mg total) by mouth daily. 01/03/17  Yes Jettie Booze, MD  glipiZIDE (GLUCOTROL XL) 10 MG 24 hr tablet Take 10 mg by mouth daily. 11/17/16  Yes [provider]  levothyroxine (SYNTHROID, LEVOTHROID) 112 MCG tablet Take 112 mcg by mouth daily.     Yes [provider]  lisinopril (PRINIVIL,ZESTRIL) 5 MG tablet Take 0.5 tablets (2.5 mg total) by mouth daily. 01/03/17  Yes Jettie Booze, MD  metoprolol tartrate (LOPRESSOR) 25 MG tablet TAKE ONE-HALF TABLET BY  MOUTH TWO TIMES DAILY Patient taking differently: TAKE ONE-HALF 12.5mg  TABLET BY  MOUTH TWO TIMES DAILY 05/09/17  Yes Jettie Booze, MD  nitroGLYCERIN (NITROSTAT) 0.4 MG SL tablet Place 1 tablet (0.4 mg total) under the tongue every 5 (five) minutes as needed for chest pain up to 3 doses 08/03/16  Yes Jettie Booze, MD  omeprazole (PRILOSEC) 20 MG capsule Take 20 mg by mouth every other day.    Yes [provider]  sennosides-docusate sodium (SENOKOT-S) 8.6-50 MG tablet Take 1 tablet by mouth daily. As needed for constipation   Yes [provider]  warfarin (COUMADIN) 5 MG tablet Take 5-7.5 mg by mouth See admin instructions. Day 1: 7.5mg  Day 2: 5mg  Day 3: 7.5mg  Day 4: 5mg  Day 5: 7.5mg  Day 6: 5mg  Day 7: 5mg , continue alternating in that order. Next anti-coag visit, 05/15/17.   Yes [provider]    Family History Family History  Problem Relation Age of Onset  . Heart disease Father        Heart attack  . Heart attack Father   . Diabetes Brother   . Cancer Neg Hx   . Stroke Neg Hx   . Hypertension Neg Hx     Social History Social History   Tobacco Use  . Smoking status: Former Smoker    Packs/day: 1.00    Years: 50.00    Pack years: 50.00    Types: Cigarettes  Last attempt to quit: 03/20/2001    Years since quitting: 16.1  . Smokeless tobacco: Former Network engineer Use Topics  . Alcohol use: No  . Drug use: No     Allergies   Patient has no known allergies.   Review of Systems Review of Systems  Constitutional: Negative for fever.  Respiratory: Negative for cough and shortness of breath.   Cardiovascular: Negative for chest pain.  Gastrointestinal: Positive for diarrhea and vomiting. Negative for abdominal pain and blood in stool.  Genitourinary: Negative for dysuria.  Musculoskeletal: Positive for neck pain (chronic).  Neurological: Negative for dizziness and headaches.  All other systems reviewed and are negative.    Physical Exam Updated Vital Signs BP 130/79   Pulse 66   Temp 98.9 F (37.2 C) (Oral)   Resp (!) 30   SpO2 99%   Physical Exam  Constitutional: He is oriented to person, place, and time. He appears well-developed and well-nourished.  HENT:  Head: Normocephalic and atraumatic.  Right Ear: External ear normal.  Left Ear: External ear normal.  Nose: Nose normal.  Eyes: EOM are normal. Pupils are equal, round,  and reactive to light. Right eye exhibits no discharge. Left eye exhibits no discharge.  Neck: Normal range of motion. Neck supple. Muscular tenderness (mild, left lateral) present. No spinous process tenderness present.  Cardiovascular: Normal rate and normal heart sounds. An irregular rhythm present.  Pulmonary/Chest: Effort normal. He has decreased breath sounds (mild) in the right lower field. He has no wheezes. He has no rales.  Abdominal: Soft. He exhibits no distension. There is no tenderness.  Musculoskeletal: He exhibits no edema.  Neurological: He is alert and oriented to person, place, and time.  Awake, alert, oriented to person, place, time and president. Oriented to situation. CN 3-12 grossly intact. 5/5 strength in all 4 extremities. Grossly normal sensation. Normal finger to nose.   Skin: Skin is warm and dry.  Nursing note and vitals reviewed.    ED Treatments / Results  Labs (all labs ordered are listed, but only abnormal results are displayed) Labs Reviewed  COMPREHENSIVE METABOLIC PANEL - Abnormal; Notable for the following components:      Result Value   Glucose, Bld 139 (*)    BUN 28 (*)    Creatinine, Ser 1.28 (*)    Calcium 8.8 (*)    Alkaline Phosphatase 159 (*)    Total Bilirubin 2.2 (*)    GFR calc non Af Amer 50 (*)    GFR calc Af Amer 58 (*)    All other components within normal limits  CBC WITH DIFFERENTIAL/PLATELET - Abnormal; Notable for the following components:   WBC 11.7 (*)    Neutro Abs 10.2 (*)    All other components within normal limits  URINALYSIS, ROUTINE W REFLEX MICROSCOPIC - Abnormal; Notable for the following components:   Protein, ur 100 (*)    All other components within normal limits  PROTIME-INR - Abnormal; Notable for the following components:   Prothrombin Time 23.9 (*)    All other components within normal limits  I-STAT CG4 LACTIC ACID, ED    EKG  EKG Interpretation  Date/Time:  Wednesday May 16 2017 19:14:16  EST Ventricular Rate:  123 PR Interval:    QRS Duration: 99 QT Interval:  320 QTC Calculation: 425 R Axis:   153 Text Interpretation:  Atrial fibrillation Right axis deviation Low voltage, precordial leads Anteroseptal infarct, old Borderline repolarization abnormality PVCs no significant change since  Apr 26 2017 Confirmed by Sherwood Gambler [phone]) on 05/16/2017 7:19:06 PM       Radiology Dg Chest 2 View  Result Date: 05/16/2017 CLINICAL DATA:  Nausea, vomiting and diarrhea today. EXAM: CHEST  2 VIEW COMPARISON:  04/26/2017. FINDINGS: Enlarged cardiac silhouette with an interval increase in size. Small right pleural effusion without significant change. Stable mild prominence of the pulmonary vasculature. Decreased prominence of the interstitial markings. Dense coronary artery calcifications. Left apical bullous changes are again demonstrated. Mild right basilar atelectasis. Thoracic spine degenerative changes. IMPRESSION: 1. Decreased prominence of the interstitial markings on the left compatible with improving pneumonitis. 2. Stable small right pleural effusion and little change in mild right basilar atelectasis. 3. Stable changes of COPD. 4. Mildly progressive cardiomegaly. 5. Dense coronary artery atheromatous calcifications. Electronically Signed   By: Claudie Revering M.D.   On: 05/16/2017 18:46   Ct Head Wo Contrast  Result Date: 05/16/2017 CLINICAL DATA:  Altered level of consciousness. EXAM: CT HEAD WITHOUT CONTRAST TECHNIQUE: Contiguous axial images were obtained from the base of the skull through the vertex without intravenous contrast. 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 The brainstem, cerebellum, cerebral peduncles, thalami, basal ganglia, basilar cisterns, and ventricular system appear within normal limits. Periventricular white matter and corona radiata hypodensities favor chronic ischemic microvascular white matter disease. No  intracranial hemorrhage, mass lesion, or acute CVA. Vascular: There is atherosclerotic calcification of the cavernous carotid arteries bilaterally. Skull: Unremarkable Sinuses/Orbits: Unremarkable Other: No supplemental non-categorized findings. IMPRESSION: 1. No acute intracranial findings. 2. Periventricular white matter and corona radiata hypodensities favor chronic ischemic microvascular white matter disease. 3. Atherosclerosis. 4. Stable mega cisterna magna. Electronically Signed   By: Van Clines M.D.   On: 05/16/2017 20:35    Procedures Procedures (including critical care time)  Medications Ordered in ED Medications  sodium chloride 0.9 % bolus 500 mL (0 mLs Intravenous Stopped 05/16/17 2114)     Initial Impression / Assessment and Plan / ED Course  I have reviewed the triage vital signs and the nursing notes.  Pertinent labs & imaging results that were available during my care of the patient were reviewed by me and considered in my medical decision making (see chart for details).     Patient is not altered here and remains well appearing. Sounds like he's had a viral gastroenteritis, but no vomiting, diarrhea or abd pain at this time. Tolerating PO. Labs unremarkable compared to baseline. Given possible confusion earlier, CT head obtained given on warfarin, is negative. Unclear if he was ever really altered for likely just talks a lot and sometimes has trouble finding words, but I don't think he's had or having a stroke. He appears well for discharge. Has afib, which is chronic, with fluctuating HR between 60s and low 100s. D/c home with grandson, return precautions.  CHA2DS2/VAS Stroke Risk Points      6 >= 2 Points: High Risk  1 - 1.99 Points: Medium Risk  0 Points: Low Risk    This is the only CHA2DS2/VAS Stroke Risk Points available for the past  year.:  Change: N/A     Details    This score determines the patient's risk of having a stroke if the  patient has atrial  fibrillation.       Points Metrics  1 Has Congestive Heart Failure:  Yes   1 Has Vascular Disease:  Yes   1 Has Hypertension:  Yes   2  Age:  34   1 Has Diabetes:  Yes   0 Had Stroke:  No  Had TIA:  No  Had thromboembolism:  No   0 Male:  No             Final Clinical Impressions(s) / ED Diagnoses   Final diagnoses:  Nausea vomiting and diarrhea  Atrial fibrillation, unspecified type [facility])    ED Discharge Orders    None       Sherwood Gambler, MD 05/17/17 [phone]

## 2017-05-16 NOTE — ED Notes (Signed)
Patient transported to X-ray 

## 2017-05-16 NOTE — ED Notes (Signed)
Pt to CT

## 2017-05-16 NOTE — ED Provider Notes (Signed)
Patient placed in Quick Look pathway, seen and evaluated   Chief Complaint: vomiting and diarrhea  HPI:   Patient brought to the ED by family member after doctor's office called and told patient's daughter that the patient was "rambling" when they called to talk to him about results of his visit. Patient reports having n/v/d that started today. He reports having vomited 6 times this afternoon and diarrhea x 2.   ROS: GI: n/v/d  Physical Exam:  BP 98/82 (BP Location: Left Arm)   Pulse 66   Temp 98.9 F (37.2 C) (Oral)   Resp 18   SpO2 99%    Gen: No distress  Neuro: Awake and Alert, patient answering  questions appropriately.   Skin: Warm and dry    Focused Exam:    Initiation of care has begun. The patient has been counseled on the process, plan, and necessity for staying for the completion/evaluation, and the remainder of the medical screening examination    Ashley Murrain, NP 05/16/17 1739    Tanna Furry, MD 05/17/17 2206

## 2017-05-16 NOTE — ED Triage Notes (Addendum)
Pt states emesis x 6 and diarrhea x 2  since 1330 today.  Also c/o L lat neck pain.  Denies headache, sob, chest pain or tingling to extremities.  Grandson states Dr's office called and stated to daughter that pt was "rambling" when they called to give him results.  PT ao x 4 in triage.

## 2017-05-22 ENCOUNTER — Ambulatory Visit (INDEPENDENT_AMBULATORY_CARE_PROVIDER_SITE_OTHER): Payer: Medicare Other | Admitting: *Deleted

## 2017-05-22 ENCOUNTER — Other Ambulatory Visit: Payer: Medicare Other

## 2017-05-22 DIAGNOSIS — I482 Chronic atrial fibrillation, unspecified: Secondary | ICD-10-CM

## 2017-05-22 DIAGNOSIS — J42 Unspecified chronic bronchitis: Secondary | ICD-10-CM

## 2017-05-22 DIAGNOSIS — I5022 Chronic systolic (congestive) heart failure: Secondary | ICD-10-CM

## 2017-05-22 DIAGNOSIS — I10 Essential (primary) hypertension: Secondary | ICD-10-CM

## 2017-05-22 DIAGNOSIS — I2699 Other pulmonary embolism without acute cor pulmonale: Secondary | ICD-10-CM

## 2017-05-22 DIAGNOSIS — I2510 Atherosclerotic heart disease of native coronary artery without angina pectoris: Secondary | ICD-10-CM

## 2017-05-22 DIAGNOSIS — I4891 Unspecified atrial fibrillation: Secondary | ICD-10-CM

## 2017-05-22 LAB — POCT INR: INR: 2.6

## 2017-05-22 NOTE — Patient Instructions (Signed)
Description   Continue taking 1 tablet daily except 1.5 tablets on Tuesdays, Thursdays, and Saturdays. Recheck INR in 4 weeks. Call us with any medication changes or concerns # [phone] [facility] # [phone].

## 2017-05-23 LAB — COMPREHENSIVE METABOLIC PANEL
ALBUMIN: 4.4 g/dL (ref 3.5–4.7)
ALK PHOS: 144 IU/L — AB (ref 39–117)
ALT: 28 IU/L (ref 0–44)
AST: 34 IU/L (ref 0–40)
Albumin/Globulin Ratio: 1.9 (ref 1.2–2.2)
BUN / CREAT RATIO: 15 (ref 10–24)
BUN: 24 mg/dL (ref 8–27)
Bilirubin Total: 0.7 mg/dL (ref 0.0–1.2)
CHLORIDE: 103 mmol/L (ref 96–106)
CO2: 25 mmol/L (ref 20–29)
Calcium: 9.3 mg/dL (ref 8.6–10.2)
Creatinine, Ser: 1.6 mg/dL — ABNORMAL HIGH (ref 0.76–1.27)
GFR calc non Af Amer: 40 mL/min/{1.73_m2} — ABNORMAL LOW (ref >59)
GFR, EST AFRICAN AMERICAN: 46 mL/min/{1.73_m2} — AB (ref >59)
GLOBULIN, TOTAL: 2.3 g/dL (ref 1.5–4.5)
GLUCOSE: 118 mg/dL — AB (ref 65–99)
Potassium: 4.5 mmol/L (ref 3.5–5.2)
SODIUM: 143 mmol/L (ref 134–144)
TOTAL PROTEIN: 6.7 g/dL (ref 6.0–8.5)

## 2017-05-23 LAB — CBC
Hematocrit: 47.4 % (ref 37.5–51.0)
Hemoglobin: 15.5 g/dL (ref 13.0–17.7)
MCH: 30.4 pg (ref 26.6–33.0)
MCHC: 32.7 g/dL (ref 31.5–35.7)
MCV: 93 fL (ref 79–97)
PLATELETS: 282 10*3/uL (ref 150–379)
RBC: 5.1 x10E6/uL (ref 4.14–5.80)
RDW: 14.8 % (ref 12.3–15.4)
WBC: 8.4 10*3/uL (ref 3.4–10.8)

## 2017-05-23 LAB — LIPID PANEL
CHOLESTEROL TOTAL: 110 mg/dL (ref 100–199)
Chol/HDL Ratio: 3.4 ratio (ref 0.0–5.0)
HDL: 32 mg/dL — AB (ref >39)
LDL Calculated: 54 mg/dL (ref 0–99)
TRIGLYCERIDES: 122 mg/dL (ref 0–149)
VLDL CHOLESTEROL CAL: 24 mg/dL (ref 5–40)

## 2017-05-23 LAB — HEPATIC FUNCTION PANEL: Bilirubin, Direct: 0.3 mg/dL (ref 0.00–0.40)

## 2017-06-20 ENCOUNTER — Ambulatory Visit (INDEPENDENT_AMBULATORY_CARE_PROVIDER_SITE_OTHER): Payer: Medicare Other | Admitting: *Deleted

## 2017-06-20 DIAGNOSIS — Z5181 Encounter for therapeutic drug level monitoring: Secondary | ICD-10-CM | POA: Diagnosis not present

## 2017-06-20 DIAGNOSIS — I482 Chronic atrial fibrillation, unspecified: Secondary | ICD-10-CM

## 2017-06-20 DIAGNOSIS — I2699 Other pulmonary embolism without acute cor pulmonale: Secondary | ICD-10-CM

## 2017-06-20 LAB — POCT INR: INR: 3.2

## 2017-06-20 NOTE — Patient Instructions (Signed)
Description   Skip today's dose, then Continue taking 1 tablet daily except 1.5 tablets on Tuesdays, Thursdays, and Saturdays. Recheck INR in 3 weeks. Call us with any medication changes or concerns # [phone] [facility] # [phone].

## 2017-06-27 ENCOUNTER — Other Ambulatory Visit: Payer: Self-pay | Admitting: *Deleted

## 2017-06-27 MED ORDER — FUROSEMIDE 20 MG PO TABS: 40.0000 mg | ORAL_TABLET | Freq: Every day | ORAL | 2 refills | Status: AC

## 2017-07-11 ENCOUNTER — Ambulatory Visit (INDEPENDENT_AMBULATORY_CARE_PROVIDER_SITE_OTHER): Payer: Medicare Other | Admitting: *Deleted

## 2017-07-11 DIAGNOSIS — Z5181 Encounter for therapeutic drug level monitoring: Secondary | ICD-10-CM | POA: Diagnosis not present

## 2017-07-11 DIAGNOSIS — I482 Chronic atrial fibrillation, unspecified: Secondary | ICD-10-CM

## 2017-07-11 DIAGNOSIS — I2699 Other pulmonary embolism without acute cor pulmonale: Secondary | ICD-10-CM

## 2017-07-11 LAB — POCT INR: INR: 2.9

## 2017-07-11 NOTE — Patient Instructions (Signed)
Description   Continue taking 1 tablet daily except 1.5 tablets on Tuesdays, Thursdays, and Saturdays. Recheck INR in 4 weeks. Call us with any medication changes or concerns # [phone] [facility] # [phone].

## 2017-08-08 ENCOUNTER — Ambulatory Visit: Payer: Medicare Other | Admitting: *Deleted

## 2017-08-08 DIAGNOSIS — I482 Chronic atrial fibrillation, unspecified: Secondary | ICD-10-CM

## 2017-08-08 DIAGNOSIS — I2699 Other pulmonary embolism without acute cor pulmonale: Secondary | ICD-10-CM

## 2017-08-08 DIAGNOSIS — Z5181 Encounter for therapeutic drug level monitoring: Secondary | ICD-10-CM | POA: Diagnosis not present

## 2017-08-08 DIAGNOSIS — I4891 Unspecified atrial fibrillation: Secondary | ICD-10-CM

## 2017-08-08 LAB — POCT INR: INR: 2.6 (ref 2.0–3.0)

## 2017-08-08 NOTE — Patient Instructions (Signed)
Description   Continue taking 1 tablet daily except 1.5 tablets on Tuesdays, Thursdays, and Saturdays. Recheck INR in 4 weeks. Call us with any medication changes or concerns # [phone] [facility] # [phone].

## 2017-09-05 ENCOUNTER — Ambulatory Visit: Payer: Medicare Other | Admitting: *Deleted

## 2017-09-05 DIAGNOSIS — I2699 Other pulmonary embolism without acute cor pulmonale: Secondary | ICD-10-CM

## 2017-09-05 DIAGNOSIS — Z5181 Encounter for therapeutic drug level monitoring: Secondary | ICD-10-CM | POA: Diagnosis not present

## 2017-09-05 DIAGNOSIS — I4891 Unspecified atrial fibrillation: Secondary | ICD-10-CM

## 2017-09-05 DIAGNOSIS — I482 Chronic atrial fibrillation, unspecified: Secondary | ICD-10-CM

## 2017-09-05 LAB — POCT INR: INR: 1.9 — AB (ref 2.0–3.0)

## 2017-09-05 NOTE — Patient Instructions (Signed)
Description   Today June 19th take 1.5 tablets then continue taking 1 tablet daily except 1.5 tablets on Tuesdays, Thursdays, and Saturdays. Recheck INR in 3 weeks. Call us with any medication changes or concerns # [phone] [facility] # [phone].

## 2017-09-10 ENCOUNTER — Other Ambulatory Visit: Payer: Self-pay

## 2017-09-10 MED ORDER — DILTIAZEM HCL ER COATED BEADS 360 MG PO CP24: 360.0000 mg | ORAL_CAPSULE | Freq: Every day | ORAL | 2 refills | Status: AC

## 2017-09-20 ENCOUNTER — Other Ambulatory Visit: Payer: Self-pay | Admitting: Interventional Cardiology

## 2017-09-26 ENCOUNTER — Ambulatory Visit: Payer: Medicare Other | Admitting: *Deleted

## 2017-09-26 DIAGNOSIS — I2699 Other pulmonary embolism without acute cor pulmonale: Secondary | ICD-10-CM | POA: Diagnosis not present

## 2017-09-26 DIAGNOSIS — I4891 Unspecified atrial fibrillation: Secondary | ICD-10-CM

## 2017-09-26 DIAGNOSIS — Z5181 Encounter for therapeutic drug level monitoring: Secondary | ICD-10-CM | POA: Diagnosis not present

## 2017-09-26 DIAGNOSIS — I482 Chronic atrial fibrillation, unspecified: Secondary | ICD-10-CM

## 2017-09-26 LAB — POCT INR: INR: 3 (ref 2.0–3.0)

## 2017-09-26 NOTE — Patient Instructions (Signed)
Description   Continue taking 1 tablet daily except 1.5 tablets on Tuesdays, Thursdays, and Saturdays. Recheck INR in 4 weeks. Call us with any medication changes or concerns # [phone] [facility] # [phone]. Do good serving of greens today then keep intake of greens consistent

## 2017-10-24 ENCOUNTER — Ambulatory Visit: Payer: Medicare Other | Admitting: Pharmacist

## 2017-10-24 DIAGNOSIS — I482 Chronic atrial fibrillation, unspecified: Secondary | ICD-10-CM

## 2017-10-24 LAB — POCT INR: INR: 1.2 — AB (ref 2.0–3.0)

## 2017-10-24 NOTE — Patient Instructions (Signed)
Description   Take 2 tablets today, 2 tablets tomorrow, and 1.5 tablets Friday, then continue taking 1 tablet daily except 1.5 tablets on Tuesdays, Thursdays, and Saturdays. Recheck INR in 1 week. Call us with any medication changes or concerns # [phone] [facility] # [phone].

## 2017-10-31 ENCOUNTER — Ambulatory Visit: Payer: Medicare Other | Admitting: Pharmacist

## 2017-10-31 DIAGNOSIS — I482 Chronic atrial fibrillation, unspecified: Secondary | ICD-10-CM

## 2017-10-31 LAB — POCT INR: INR: 4.1 — AB (ref 2.0–3.0)

## 2017-10-31 NOTE — Patient Instructions (Signed)
Description   Skip your Coumadin today, take 1 tablet tomorrow, then continue taking 1 tablet daily except 1.5 tablets on Tuesdays, Thursdays, and Saturdays. Recheck INR in 10 days. Call us with any medication changes or concerns # [phone] [facility] # [phone].

## 2017-11-14 ENCOUNTER — Ambulatory Visit: Payer: Medicare Other | Admitting: Pharmacist

## 2017-11-14 DIAGNOSIS — I482 Chronic atrial fibrillation, unspecified: Secondary | ICD-10-CM

## 2017-11-14 LAB — POCT INR: INR: 1.6 — AB (ref 2.0–3.0)

## 2017-11-14 NOTE — Patient Instructions (Signed)
Description   Today take extra 1/2 tablet then continue taking 1 tablet daily except 1.5 tablets on Tuesdays, Thursdays, and Saturdays. Recheck INR in 2 weeks. Call us with any medication changes or concerns # [phone] [facility] # [phone].

## 2017-11-28 ENCOUNTER — Ambulatory Visit: Payer: Medicare Other | Admitting: *Deleted

## 2017-11-28 DIAGNOSIS — Z5181 Encounter for therapeutic drug level monitoring: Secondary | ICD-10-CM

## 2017-11-28 DIAGNOSIS — I482 Chronic atrial fibrillation, unspecified: Secondary | ICD-10-CM

## 2017-11-28 LAB — POCT INR: INR: 1.5 — AB (ref 2.0–3.0)

## 2017-11-28 NOTE — Patient Instructions (Signed)
Description   Today take 1.5 tablets and tomorrow take 2 tablets then continue taking 1 tablet daily except 1.5 tablets on Tuesdays, Thursdays, and Saturdays. Recheck INR in 2 weeks. Call us with any medication changes or concerns # [phone] [facility] # [phone].

## 2017-12-02 ENCOUNTER — Other Ambulatory Visit: Payer: Self-pay | Admitting: Interventional Cardiology

## 2017-12-12 ENCOUNTER — Ambulatory Visit: Payer: Medicare Other | Admitting: *Deleted

## 2017-12-12 DIAGNOSIS — I482 Chronic atrial fibrillation, unspecified: Secondary | ICD-10-CM

## 2017-12-12 DIAGNOSIS — Z5181 Encounter for therapeutic drug level monitoring: Secondary | ICD-10-CM

## 2017-12-12 LAB — POCT INR: INR: 2.2 (ref 2.0–3.0)

## 2017-12-12 NOTE — Patient Instructions (Signed)
Description   Continue taking 1 tablet daily except 1.5 tablets on Tuesdays, Thursdays, and Saturdays.  Recheck INR in 2 weeks. Call us with any medication changes or concerns # [phone] [facility] # [phone].

## 2017-12-27 ENCOUNTER — Ambulatory Visit: Payer: Medicare Other | Admitting: *Deleted

## 2017-12-27 DIAGNOSIS — I482 Chronic atrial fibrillation, unspecified: Secondary | ICD-10-CM | POA: Diagnosis not present

## 2017-12-27 DIAGNOSIS — Z5181 Encounter for therapeutic drug level monitoring: Secondary | ICD-10-CM | POA: Diagnosis not present

## 2017-12-27 LAB — POCT INR: INR: 2.1 (ref 2.0–3.0)

## 2017-12-27 NOTE — Patient Instructions (Signed)
Description   Continue taking 1 tablet daily except 1.5 tablets on Tuesdays, Thursdays, and Saturdays.  Recheck INR in 3 weeks. Call us with any medication changes or concerns # [phone] [facility] # [phone].

## 2018-01-17 ENCOUNTER — Ambulatory Visit (INDEPENDENT_AMBULATORY_CARE_PROVIDER_SITE_OTHER): Payer: Medicare Other | Admitting: *Deleted

## 2018-01-17 DIAGNOSIS — Z7901 Long term (current) use of anticoagulants: Secondary | ICD-10-CM

## 2018-01-17 DIAGNOSIS — I482 Chronic atrial fibrillation, unspecified: Secondary | ICD-10-CM

## 2018-01-17 DIAGNOSIS — I2699 Other pulmonary embolism without acute cor pulmonale: Secondary | ICD-10-CM

## 2018-01-17 DIAGNOSIS — Z5181 Encounter for therapeutic drug level monitoring: Secondary | ICD-10-CM | POA: Diagnosis not present

## 2018-01-17 LAB — POCT INR: INR: 2.3 (ref 2.0–3.0)

## 2018-01-17 NOTE — Patient Instructions (Signed)
Description   Continue taking 1 tablet daily except 1.5 tablets on Tuesdays, Thursdays, and Saturdays.  Recheck INR in 4 weeks. Call us with any medication changes or concerns # [phone] [facility] # [phone].

## 2018-02-04 ENCOUNTER — Inpatient Hospital Stay (HOSPITAL_COMMUNITY)
Admission: EM | Admit: 2018-02-04 | Discharge: 2018-02-11 | DRG: 070 | Disposition: A | Discharge status: Skilled Nursing Facility | Payer: Medicare Other | Attending: Family Medicine | Admitting: Family Medicine

## 2018-02-04 ENCOUNTER — Emergency Department (HOSPITAL_COMMUNITY): Payer: Medicare Other

## 2018-02-04 DIAGNOSIS — E039 Hypothyroidism, unspecified: Secondary | ICD-10-CM | POA: Diagnosis present

## 2018-02-04 DIAGNOSIS — G9341 Metabolic encephalopathy: Secondary | ICD-10-CM | POA: Diagnosis not present

## 2018-02-04 DIAGNOSIS — E1122 Type 2 diabetes mellitus with diabetic chronic kidney disease: Secondary | ICD-10-CM | POA: Diagnosis present

## 2018-02-04 DIAGNOSIS — J449 Chronic obstructive pulmonary disease, unspecified: Secondary | ICD-10-CM | POA: Diagnosis present

## 2018-02-04 DIAGNOSIS — I5022 Chronic systolic (congestive) heart failure: Secondary | ICD-10-CM | POA: Diagnosis present

## 2018-02-04 DIAGNOSIS — I482 Chronic atrial fibrillation, unspecified: Secondary | ICD-10-CM | POA: Diagnosis present

## 2018-02-04 DIAGNOSIS — Z7984 Long term (current) use of oral hypoglycemic drugs: Secondary | ICD-10-CM

## 2018-02-04 DIAGNOSIS — E876 Hypokalemia: Secondary | ICD-10-CM | POA: Diagnosis not present

## 2018-02-04 DIAGNOSIS — N182 Chronic kidney disease, stage 2 (mild): Secondary | ICD-10-CM | POA: Diagnosis present

## 2018-02-04 DIAGNOSIS — I2510 Atherosclerotic heart disease of native coronary artery without angina pectoris: Secondary | ICD-10-CM | POA: Diagnosis present

## 2018-02-04 DIAGNOSIS — Z9221 Personal history of antineoplastic chemotherapy: Secondary | ICD-10-CM

## 2018-02-04 DIAGNOSIS — Z9049 Acquired absence of other specified parts of digestive tract: Secondary | ICD-10-CM

## 2018-02-04 DIAGNOSIS — Z87891 Personal history of nicotine dependence: Secondary | ICD-10-CM

## 2018-02-04 DIAGNOSIS — J189 Pneumonia, unspecified organism: Secondary | ICD-10-CM

## 2018-02-04 DIAGNOSIS — K219 Gastro-esophageal reflux disease without esophagitis: Secondary | ICD-10-CM | POA: Diagnosis present

## 2018-02-04 DIAGNOSIS — I130 Hypertensive heart and chronic kidney disease with heart failure and stage 1 through stage 4 chronic kidney disease, or unspecified chronic kidney disease: Secondary | ICD-10-CM | POA: Diagnosis present

## 2018-02-04 DIAGNOSIS — B9789 Other viral agents as the cause of diseases classified elsewhere: Secondary | ICD-10-CM | POA: Diagnosis present

## 2018-02-04 DIAGNOSIS — I5023 Acute on chronic systolic (congestive) heart failure: Secondary | ICD-10-CM | POA: Diagnosis present

## 2018-02-04 DIAGNOSIS — E860 Dehydration: Secondary | ICD-10-CM | POA: Diagnosis present

## 2018-02-04 DIAGNOSIS — Z923 Personal history of irradiation: Secondary | ICD-10-CM

## 2018-02-04 DIAGNOSIS — I2699 Other pulmonary embolism without acute cor pulmonale: Secondary | ICD-10-CM | POA: Diagnosis present

## 2018-02-04 DIAGNOSIS — D72829 Elevated white blood cell count, unspecified: Secondary | ICD-10-CM | POA: Diagnosis present

## 2018-02-04 DIAGNOSIS — Z79890 Hormone replacement therapy (postmenopausal): Secondary | ICD-10-CM

## 2018-02-04 DIAGNOSIS — Z85118 Personal history of other malignant neoplasm of bronchus and lung: Secondary | ICD-10-CM

## 2018-02-04 DIAGNOSIS — E162 Hypoglycemia, unspecified: Secondary | ICD-10-CM | POA: Diagnosis present

## 2018-02-04 DIAGNOSIS — Z86711 Personal history of pulmonary embolism: Secondary | ICD-10-CM

## 2018-02-04 DIAGNOSIS — Z7951 Long term (current) use of inhaled steroids: Secondary | ICD-10-CM

## 2018-02-04 DIAGNOSIS — R531 Weakness: Secondary | ICD-10-CM

## 2018-02-04 DIAGNOSIS — J9601 Acute respiratory failure with hypoxia: Secondary | ICD-10-CM | POA: Diagnosis present

## 2018-02-04 DIAGNOSIS — Z7901 Long term (current) use of anticoagulants: Secondary | ICD-10-CM

## 2018-02-04 DIAGNOSIS — Z833 Family history of diabetes mellitus: Secondary | ICD-10-CM

## 2018-02-04 DIAGNOSIS — I10 Essential (primary) hypertension: Secondary | ICD-10-CM | POA: Diagnosis present

## 2018-02-04 DIAGNOSIS — J441 Chronic obstructive pulmonary disease with (acute) exacerbation: Secondary | ICD-10-CM | POA: Diagnosis present

## 2018-02-04 DIAGNOSIS — N189 Chronic kidney disease, unspecified: Secondary | ICD-10-CM

## 2018-02-04 DIAGNOSIS — I4510 Unspecified right bundle-branch block: Secondary | ICD-10-CM | POA: Diagnosis present

## 2018-02-04 DIAGNOSIS — E785 Hyperlipidemia, unspecified: Secondary | ICD-10-CM | POA: Diagnosis present

## 2018-02-04 DIAGNOSIS — Z8249 Family history of ischemic heart disease and other diseases of the circulatory system: Secondary | ICD-10-CM

## 2018-02-04 DIAGNOSIS — R339 Retention of urine, unspecified: Secondary | ICD-10-CM | POA: Diagnosis present

## 2018-02-04 DIAGNOSIS — E11649 Type 2 diabetes mellitus with hypoglycemia without coma: Secondary | ICD-10-CM | POA: Diagnosis present

## 2018-02-04 DIAGNOSIS — I480 Paroxysmal atrial fibrillation: Secondary | ICD-10-CM | POA: Diagnosis present

## 2018-02-04 DIAGNOSIS — N179 Acute kidney failure, unspecified: Secondary | ICD-10-CM | POA: Diagnosis present

## 2018-02-04 LAB — CBG MONITORING, ED
Glucose-Capillary: 61 mg/dL — ABNORMAL LOW (ref 70–99)
Glucose-Capillary: 85 mg/dL (ref 70–99)

## 2018-02-04 LAB — BASIC METABOLIC PANEL WITH GFR
Anion gap: 12 (ref 5–15)
BUN: 30 mg/dL — ABNORMAL HIGH (ref 8–23)
CO2: 24 mmol/L (ref 22–32)
Calcium: 8.1 mg/dL — ABNORMAL LOW (ref 8.9–10.3)
Chloride: 103 mmol/L (ref 98–111)
Creatinine, Ser: 1.52 mg/dL — ABNORMAL HIGH (ref 0.61–1.24)
GFR calc Af Amer: 47 mL/min — ABNORMAL LOW
GFR calc non Af Amer: 41 mL/min — ABNORMAL LOW
Glucose, Bld: 66 mg/dL — ABNORMAL LOW (ref 70–99)
Potassium: 2.8 mmol/L — ABNORMAL LOW (ref 3.5–5.1)
Sodium: 139 mmol/L (ref 135–145)

## 2018-02-04 LAB — CBC WITH DIFFERENTIAL/PLATELET
Abs Immature Granulocytes: 0.06 10*3/uL (ref 0.00–0.07)
BASOS PCT: 0 %
Basophils Absolute: 0 10*3/uL (ref 0.0–0.1)
EOS ABS: 0 10*3/uL (ref 0.0–0.5)
EOS PCT: 0 %
HEMATOCRIT: 41.3 % (ref 39.0–52.0)
Hemoglobin: 13.8 g/dL (ref 13.0–17.0)
Immature Granulocytes: 0 %
Lymphocytes Relative: 15 %
Lymphs Abs: 2 10*3/uL (ref 0.7–4.0)
MCH: 31.4 pg (ref 26.0–34.0)
MCHC: 33.4 g/dL (ref 30.0–36.0)
MCV: 93.9 fL (ref 80.0–100.0)
MONO ABS: 1.3 10*3/uL — AB (ref 0.1–1.0)
MONOS PCT: 9 %
NEUTROS ABS: 10.5 10*3/uL — AB (ref 1.7–7.7)
Neutrophils Relative %: 76 %
Platelets: 250 10*3/uL (ref 150–400)
RBC: 4.4 MIL/uL (ref 4.22–5.81)
RDW: 14.9 % (ref 11.5–15.5)
WBC: 14 10*3/uL — AB (ref 4.0–10.5)
nRBC: 0 % (ref 0.0–0.2)

## 2018-02-04 LAB — BRAIN NATRIURETIC PEPTIDE: B Natriuretic Peptide: 239.7 pg/mL — ABNORMAL HIGH (ref 0.0–100.0)

## 2018-02-04 LAB — PROTIME-INR
INR: 1.37
Prothrombin Time: 16.8 s — ABNORMAL HIGH (ref 11.4–15.2)

## 2018-02-04 MED ORDER — SODIUM CHLORIDE 0.9 % IV BOLUS
1000.0000 mL | Freq: Once | INTRAVENOUS | Status: AC
  Administered 2018-02-04: 1000 mL via INTRAVENOUS

## 2018-02-04 MED ORDER — POTASSIUM CHLORIDE CRYS ER 20 MEQ PO TBCR
40.0000 meq | EXTENDED_RELEASE_TABLET | Freq: Once | ORAL | Status: AC
  Administered 2018-02-04: 40 meq via ORAL
  Filled 2018-02-04: qty 2

## 2018-02-04 NOTE — ED Triage Notes (Addendum)
Pt arrived via [facility] from home after family reported pt being found incontinent of urine while sitting in the recliner. Family at bedside reporting pt has had increasing memory lapses. Also reporting pt has been febrile. No fever noted at time of triage. EMS v/s 104/72, 76hr, 96%ra. Pt is alert and oriented x4.

## 2018-02-04 NOTE — ED Notes (Signed)
RN notified of blood sugar being low, pt was provided orange juice to drink.

## 2018-02-04 NOTE — ED Notes (Signed)
ED Provider at bedside. 

## 2018-02-04 NOTE — ED Provider Notes (Signed)
Medical screening examination/treatment/procedure(s) were conducted as a shared visit with non-physician practitioner(s) and myself.  I personally evaluated the patient during the encounter.  Here generalized weakness similar to previous episodes of UTI and pneumonia.  No this.  On my exam his eyes are sunken his heart rate is a bit elevated his blood pressures are soft he is a bit tachypneic.  His lungs seem to be diminished on the left more than the right. Sent for possible pneumonia versus metabolic causes for his symptoms.  We will plan for labs x-ray and hydration.  Reevaluation for improvement or disposition as per labs and work-up. Patient adamantly against assisted living placement.  EKG Interpretation  Date/Time:  Monday February 04 2018 21:46:19 EST Ventricular Rate:  90 PR Interval:    QRS Duration: 159 QT Interval:  438 QTC Calculation: 459 R Axis:   70 Text Interpretation:  Atrial fibrillation Ventricular premature complex Right bundle branch block improved heart rate compared to previous. similar morphology Confirmed by Merrily Pew [phone]) on 02/04/2018 10:29:57 PM     Corlene Sabia, Corene Cornea, MD 02/11/18 [phone]

## 2018-02-04 NOTE — ED Provider Notes (Signed)
[facility] EMERGENCY DEPARTMENT Provider Note   CSN: [id number] Arrival date & time: 02/04/18  2146     History   Chief Complaint No chief complaint on file.   HPI Justin Garner is a 84 y.o. male.  Patient with past medical history remarkable for COPD, CKD, A. fib on Coumadin, diabetes, lung cancer with right upper lobe lobectomy in 2009, presents to the emergency department with a chief complaint of generalized weakness.  He is accompanied by his granddaughter, he states that he has not been himself for about the past week.  She reports some subjective fevers at home, but has not measured a true fever.  She states that he has been having some cognitive decline as well as being less active than normal this week.  She states that he has not even gotten out of his recliner today, and was found to have been incontinent in the recliner today.  He does occasionally suffer from incontinence.  The history is provided by the patient. No language interpreter was used.    Past Medical History:  Diagnosis Date  . ASCVD (arteriosclerotic cardiovascular disease)   . Cancer [facility]) 2009   Upper lobectomy;radiation; chemotherapy  . Chronic systolic CHF (congestive heart failure) ([place])   . CKD (chronic kidney disease), stage II   . COPD (chronic obstructive pulmonary disease) ([place])   . Coronary artery disease   . Diabetes mellitus   . GERD (gastroesophageal reflux disease)   . Hyperlipidemia   . Hypertension   . Leukocytosis 07/12/2015  . PAF (paroxysmal atrial fibrillation) (HCC)    Chronic warfarin therapy  . Primary cancer of right upper lobe of lung ([place]) 06/30/2013   RUL lobectomy 2009 with chest wall resection for superior sulcus tumor Postoperative radiation and chemotherapy; Dr Earlie Server Oncology monitor [phone]  . Pulmonary embolism (HCC)    PMH of  . Thyroid disease     Patient Active Problem List   Diagnosis Date Noted  . Hemoptysis 04/26/2017  . Lobar  pneumonia ([place]) 12/20/2016  . Atrial fibrillation with RVR ([place]) 06/09/2016  . Acute encephalopathy 06/09/2016  . COPD exacerbation ([place]) 11/15/2015  . Acute on chronic kidney failure-II 11/15/2015  . Malnutrition of moderate degree 11/15/2015  . Leukocytosis 07/12/2015  . Chronic obstructive pulmonary disease ([place])   . Cardiomyopathy ([place])   . CAD in native artery   . Hypomagnesemia   . CAP (community acquired pneumonia) 06/13/2015  . Acute on chronic respiratory failure with hypoxia ([place]) 06/13/2015  . Hypothyroidism 06/13/2015  . HLD (hyperlipidemia) 06/13/2015  . Chronic systolic CHF (congestive heart failure) ([place])   . Protein-calorie malnutrition, severe ([place]) 12/29/2013  . Hypokalemia 12/28/2013  . Sepsis ([place]) 12/28/2013  . 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 ([place]) 12/28/2013  . Acute systolic heart failure ([place]) 08/12/2013  . Primary cancer of right upper lobe of lung ([place]) 06/30/2013  . Coronary atherosclerosis of native coronary artery 05/28/2013  . Essential hypertension, benign 05/28/2013  . Chronic atrial fibrillation 12/24/2012  . Pulmonary embolism ([place]) 12/24/2012  . COPD with acute bronchitis ([place]) 04/23/2008    Past Surgical History:  Procedure Laterality Date  . CAROTID STENT    . CHOLECYSTECTOMY    . FIBEROPTIC BRONCHOSCOPY AND MEIASTINOSCOPY  06/07/2007  . HERNIA REPAIR    . INGUINAL HERNIA REPAIR  10/10/2010  . LEFT HEART CATHETERIZATION WITH CORONARY ANGIOGRAM N/A 08/19/2013   Procedure: LEFT HEART CATHETERIZATION WITH CORONARY ANGIOGRAM;  Surgeon: Jettie Booze, MD;  Location:  [facility] CATH [facility];  Service: Cardiovascular;  Laterality: N/A;  . R VATS,THORACOTOMY AND UPPER LOBECTOMY  08/02/2007        Home Medications    Prior to Admission medications   Medication Sig Start Date End Date Taking? Authorizing Provider  albuterol (PROVENTIL HFA;VENTOLIN HFA) 108 (90 Base) MCG/ACT inhaler Inhale 2 puffs into the lungs every 6 (six) hours as needed for  wheezing or shortness of breath. 11/16/15   Cristal Ford, DO  Artificial Tear Ointment (DRY EYES OP) Place 1 drop into both eyes as needed (dry eye).    [provider]  atorvastatin (LIPITOR) 20 MG tablet TAKE 1 TABLET BY MOUTH  DAILY 05/09/17   Jettie Booze, MD  budesonide-formoterol [facility]) 80-4.5 MCG/ACT inhaler Inhale 2 puffs into the lungs every morning. 11/16/15   Cristal Ford, DO  dextromethorphan-guaiFENesin (MUCINEX DM) 30-600 MG 12hr tablet Take 1 tablet by mouth 2 (two) times daily as needed for cough. 11/16/15   Mikhail, Velta Addison, DO  diltiazem (CARDIZEM CD) 360 MG 24 hr capsule Take 1 capsule (360 mg total) by mouth daily. 09/10/17   Jettie Booze, MD  fish oil-omega-3 fatty acids 1000 MG capsule Take 1 g by mouth daily.     [provider]  furosemide (LASIX) 20 MG tablet Take 2 tablets (40 mg total) by mouth daily. 06/27/17   Lyda Jester M, PA-C  glipiZIDE (GLUCOTROL XL) 10 MG 24 hr tablet Take 10 mg by mouth daily. 11/17/16   [provider]  levothyroxine (SYNTHROID, LEVOTHROID) 112 MCG tablet Take 112 mcg by mouth daily.      [provider]  lisinopril (PRINIVIL,ZESTRIL) 5 MG tablet Take 0.5 tablets (2.5 mg total) by mouth daily. 01/03/17   Jettie Booze, MD  metoprolol tartrate (LOPRESSOR) 25 MG tablet TAKE ONE-HALF TABLET BY  MOUTH TWO TIMES DAILY Patient taking differently: TAKE ONE-HALF 12.5mg  TABLET BY  MOUTH TWO TIMES DAILY 05/09/17   Jettie Booze, MD  mirtazapine (REMERON) 15 MG tablet Take 15 mg by mouth at bedtime.    [provider]  nitroGLYCERIN (NITROSTAT) 0.4 MG SL tablet Place 1 tablet (0.4 mg total) under the tongue every 5 (five) minutes as needed for chest pain up to 3 doses 08/03/16   Jettie Booze, MD  omeprazole (PRILOSEC) 20 MG capsule Take 20 mg by mouth every other day.     [provider]  sennosides-docusate sodium (SENOKOT-S) 8.6-50 MG tablet Take 1 tablet  by mouth daily. As needed for constipation    [provider]  warfarin (COUMADIN) 5 MG tablet TAKE AS DIRECTED BY  COUMADIN CLINIC 12/03/17   Jettie Booze, MD    Family History Family History  Problem Relation Age of Onset  . Heart disease Father        Heart attack  . Heart attack Father   . Diabetes Brother   . Cancer Neg Hx   . Stroke Neg Hx   . Hypertension Neg Hx     Social History Social History   Tobacco Use  . Smoking status: Former Smoker    Packs/day: 1.00    Years: 50.00    Pack years: 50.00    Types: Cigarettes    Last attempt to quit: 03/20/2001    Years since quitting: 16.8  . Smokeless tobacco: Former Network engineer Use Topics  . Alcohol use: No  . Drug use: No     Allergies   Patient has no known allergies.  Review of Systems Review of Systems  All other systems reviewed and are negative.    Physical Exam Updated Vital Signs BP 114/81 (BP Location: Right Arm)   Pulse (!) 58   Temp 98.4 F (36.9 C) (Oral)   Resp (!) 28   SpO2 97%   Physical Exam  Constitutional: He is oriented to person, place, and time. He appears well-developed and well-nourished.  HENT:  Head: Normocephalic and atraumatic.  Eyes: Pupils are equal, round, and reactive to light. Conjunctivae and EOM are normal. Right eye exhibits no discharge. Left eye exhibits no discharge. No scleral icterus.  Neck: Normal range of motion. Neck supple. No JVD present.  Cardiovascular: Normal rate, regular rhythm and normal heart sounds. Exam reveals no gallop and no friction rub.  No murmur heard. Pulmonary/Chest: Effort normal and breath sounds normal. No respiratory distress. He has no wheezes. He has no rales. He exhibits no tenderness.  Abdominal: Soft. He exhibits no distension and no mass. There is no tenderness. There is no rebound and no guarding.  Musculoskeletal: Normal range of motion. He exhibits no edema or tenderness.  Neurological: He is alert and  oriented to person, place, and time.  Skin: Skin is warm and dry.  Psychiatric: He has a normal mood and affect. His behavior is normal. Judgment and thought content normal.  Nursing note and vitals reviewed.    ED Treatments / Results  Labs (all labs ordered are listed, but only abnormal results are displayed) Labs Reviewed  CBC WITH DIFFERENTIAL/PLATELET - Abnormal; Notable for the following components:      Result Value   WBC 14.0 (*)    Neutro Abs 10.5 (*)    Monocytes Absolute 1.3 (*)    All other components within normal limits  BASIC METABOLIC PANEL - Abnormal; Notable for the following components:   Potassium 2.8 (*)    Glucose, Bld 66 (*)    BUN 30 (*)    Creatinine, Ser 1.52 (*)    Calcium 8.1 (*)    GFR calc non Af Amer 41 (*)    GFR calc Af Amer 47 (*)    All other components within normal limits  BRAIN NATRIURETIC PEPTIDE - Abnormal; Notable for the following components:   B Natriuretic Peptide 239.7 (*)    All other components within normal limits  PROTIME-INR - Abnormal; Notable for the following components:   Prothrombin Time 16.8 (*)    All other components within normal limits  CBG MONITORING, ED - Abnormal; Notable for the following components:   Glucose-Capillary 61 (*)    All other components within normal limits  URINALYSIS, ROUTINE W REFLEX MICROSCOPIC  CBG MONITORING, ED    EKG EKG Interpretation  Date/Time:  Monday February 04 2018 21:46:19 EST Ventricular Rate:  90 PR Interval:    QRS Duration: 159 QT Interval:  438 QTC Calculation: 459 R Axis:   70 Text Interpretation:  Atrial fibrillation Ventricular premature complex Right bundle branch block improved heart rate compared to previous. similar morphology Confirmed by Merrily Pew [phone]) on 02/04/2018 10:29:57 PM   Radiology Dg Chest 2 View  Result Date: 02/04/2018 CLINICAL DATA:  Cough for a few days EXAM: CHEST - 2 VIEW COMPARISON:  May 16, 2017 FINDINGS: Blunting of the right  costophrenic angle is stable. Mild interstitial prominence is unchanged. Stable cardiomegaly. No other acute abnormalities or changes. IMPRESSION: 1. Cardiomegaly. 2. Stable mildly prominent interstitial markings, could be due to the patient's known underlying emphysematous changes or  mild chronic edema. Recommend clinical correlation. 3. Chronic blunting of the right costophrenic angle consistent with a small effusion seen on previous CT imaging. 4. No other acute abnormalities. Electronically Signed   By: Dorise Bullion III M.D   On: 02/04/2018 23:06    Procedures Procedures (including critical care time)  Medications Ordered in ED Medications - No data to display   Initial Impression / Assessment and Plan / ED Course  I have reviewed the triage vital signs and the nursing notes.  Pertinent labs & imaging results that were available during my care of the patient were reviewed by me and considered in my medical decision making (see chart for details).     Patient brought to emergency department with chief complaint of generalized weakness.  He has been more weak than normal over the past 1 week.  Was unable to get out of his chair today.  Noted to be mildly hypoglycemic initially, CBG was 61.  This improved with orange juice.  Laboratory work-up remarkable for potassium of 2.8, will give some potassium replacement in the ED.  EKG shows A. fib, but without evidence of RVR.  Patient has been rate controlled generally in the 80s and 90s during his emergency department stay, but has had a couple of runs as high as 137, but these have been brief.  Chest x-ray shows mild edema, no evidence of infection.  Urinalysis is negative.  Patient seen by discussed with Dr. Dayna Barker, who agrees with plan for observation in the hospital.  Discussed with Dr. Blaine Hamper, from [facility], bringing patient into the hospital.  Appreciate his help.  Final Clinical Impressions(s) / ED Diagnoses   Final diagnoses:  Hypokalemia    Generalized weakness    ED Discharge Orders    None       Montine Circle, PA-C 02/05/18 0242    Mesner, Corene Cornea, MD 02/11/18 [phone]

## 2018-02-05 ENCOUNTER — Encounter (HOSPITAL_COMMUNITY): Payer: Self-pay | Admitting: Internal Medicine

## 2018-02-05 ENCOUNTER — Other Ambulatory Visit: Payer: Self-pay

## 2018-02-05 ENCOUNTER — Observation Stay (HOSPITAL_COMMUNITY): Payer: Medicare Other

## 2018-02-05 DIAGNOSIS — I10 Essential (primary) hypertension: Secondary | ICD-10-CM

## 2018-02-05 DIAGNOSIS — I2699 Other pulmonary embolism without acute cor pulmonale: Secondary | ICD-10-CM

## 2018-02-05 DIAGNOSIS — R531 Weakness: Secondary | ICD-10-CM

## 2018-02-05 DIAGNOSIS — E876 Hypokalemia: Secondary | ICD-10-CM | POA: Diagnosis not present

## 2018-02-05 DIAGNOSIS — G9341 Metabolic encephalopathy: Secondary | ICD-10-CM | POA: Diagnosis not present

## 2018-02-05 DIAGNOSIS — E1122 Type 2 diabetes mellitus with diabetic chronic kidney disease: Secondary | ICD-10-CM

## 2018-02-05 DIAGNOSIS — J42 Unspecified chronic bronchitis: Secondary | ICD-10-CM

## 2018-02-05 DIAGNOSIS — D72829 Elevated white blood cell count, unspecified: Secondary | ICD-10-CM

## 2018-02-05 DIAGNOSIS — N182 Chronic kidney disease, stage 2 (mild): Secondary | ICD-10-CM

## 2018-02-05 DIAGNOSIS — N179 Acute kidney failure, unspecified: Secondary | ICD-10-CM

## 2018-02-05 DIAGNOSIS — E039 Hypothyroidism, unspecified: Secondary | ICD-10-CM

## 2018-02-05 DIAGNOSIS — I5022 Chronic systolic (congestive) heart failure: Secondary | ICD-10-CM

## 2018-02-05 DIAGNOSIS — E162 Hypoglycemia, unspecified: Secondary | ICD-10-CM

## 2018-02-05 DIAGNOSIS — I482 Chronic atrial fibrillation, unspecified: Secondary | ICD-10-CM

## 2018-02-05 LAB — CBC
HEMATOCRIT: 41 % (ref 39.0–52.0)
HEMOGLOBIN: 12.9 g/dL — AB (ref 13.0–17.0)
MCH: 30.1 pg (ref 26.0–34.0)
MCHC: 31.5 g/dL (ref 30.0–36.0)
MCV: 95.8 fL (ref 80.0–100.0)
Platelets: 257 10*3/uL (ref 150–400)
RBC: 4.28 MIL/uL (ref 4.22–5.81)
RDW: 15 % (ref 11.5–15.5)
WBC: 13.6 10*3/uL — AB (ref 4.0–10.5)
nRBC: 0 % (ref 0.0–0.2)

## 2018-02-05 LAB — BASIC METABOLIC PANEL
ANION GAP: 10 (ref 5–15)
Anion gap: 10 (ref 5–15)
BUN: 26 mg/dL — ABNORMAL HIGH (ref 8–23)
BUN: 27 mg/dL — ABNORMAL HIGH (ref 8–23)
CALCIUM: 7.9 mg/dL — AB (ref 8.9–10.3)
CALCIUM: 8.2 mg/dL — AB (ref 8.9–10.3)
CO2: 27 mmol/L (ref 22–32)
CO2: 23 mmol/L (ref 22–32)
CREATININE: 1.39 mg/dL — AB (ref 0.61–1.24)
Chloride: 103 mmol/L (ref 98–111)
Chloride: 105 mmol/L (ref 98–111)
Creatinine, Ser: 1.34 mg/dL — ABNORMAL HIGH (ref 0.61–1.24)
GFR calc Af Amer: 52 mL/min — ABNORMAL LOW (ref >60)
GFR calc Af Amer: 55 mL/min — ABNORMAL LOW (ref >60)
GFR, EST NON AFRICAN AMERICAN: 45 mL/min — AB (ref >60)
GFR, EST NON AFRICAN AMERICAN: 47 mL/min — AB (ref >60)
GLUCOSE: 118 mg/dL — AB (ref 70–99)
Glucose, Bld: 71 mg/dL (ref 70–99)
POTASSIUM: 2.8 mmol/L — AB (ref 3.5–5.1)
POTASSIUM: 3.6 mmol/L (ref 3.5–5.1)
SODIUM: 140 mmol/L (ref 135–145)
SODIUM: 138 mmol/L (ref 135–145)

## 2018-02-05 LAB — RESPIRATORY PANEL BY PCR
Adenovirus: NOT DETECTED
BORDETELLA PERTUSSIS-RVPCR: NOT DETECTED
CORONAVIRUS 229E-RVPPCR: NOT DETECTED
CORONAVIRUS OC43-RVPPCR: NOT DETECTED
Chlamydophila pneumoniae: NOT DETECTED
Coronavirus HKU1: NOT DETECTED
Coronavirus NL63: NOT DETECTED
INFLUENZA B-RVPPCR: NOT DETECTED
Influenza A: NOT DETECTED
MYCOPLASMA PNEUMONIAE-RVPPCR: NOT DETECTED
Metapneumovirus: NOT DETECTED
PARAINFLUENZA VIRUS 1-RVPPCR: NOT DETECTED
PARAINFLUENZA VIRUS 2-RVPPCR: NOT DETECTED
Parainfluenza Virus 3: NOT DETECTED
Parainfluenza Virus 4: NOT DETECTED
RESPIRATORY SYNCYTIAL VIRUS-RVPPCR: NOT DETECTED
Rhinovirus / Enterovirus: DETECTED — AB

## 2018-02-05 LAB — URINALYSIS, ROUTINE W REFLEX MICROSCOPIC
Bilirubin Urine: NEGATIVE
GLUCOSE, UA: NEGATIVE mg/dL
Hgb urine dipstick: NEGATIVE
Ketones, ur: NEGATIVE mg/dL
Leukocytes, UA: NEGATIVE
Nitrite: NEGATIVE
PROTEIN: NEGATIVE mg/dL
Specific Gravity, Urine: 1.014 (ref 1.005–1.030)
pH: 5 (ref 5.0–8.0)

## 2018-02-05 LAB — GLUCOSE, CAPILLARY
GLUCOSE-CAPILLARY: 127 mg/dL — AB (ref 70–99)
GLUCOSE-CAPILLARY: 171 mg/dL — AB (ref 70–99)
Glucose-Capillary: 98 mg/dL (ref 70–99)

## 2018-02-05 LAB — CBG MONITORING, ED
GLUCOSE-CAPILLARY: 65 mg/dL — AB (ref 70–99)
Glucose-Capillary: 176 mg/dL — ABNORMAL HIGH (ref 70–99)
Glucose-Capillary: 84 mg/dL (ref 70–99)

## 2018-02-05 LAB — HEPARIN LEVEL (UNFRACTIONATED): HEPARIN UNFRACTIONATED: 0.34 [IU]/mL (ref 0.30–0.70)

## 2018-02-05 LAB — TSH: TSH: 4.513 u[IU]/mL — ABNORMAL HIGH (ref 0.350–4.500)

## 2018-02-05 LAB — MAGNESIUM: Magnesium: 1.2 mg/dL — ABNORMAL LOW (ref 1.7–2.4)

## 2018-02-05 MED ORDER — HYDRALAZINE HCL 20 MG/ML IJ SOLN: 5.0000 mg | INTRAMUSCULAR | Status: DC | PRN

## 2018-02-05 MED ORDER — INSULIN ASPART 100 UNIT/ML ~~LOC~~ SOLN: 0.0000 [IU] | Freq: Three times a day (TID) | SUBCUTANEOUS | Status: DC

## 2018-02-05 MED ORDER — ATORVASTATIN CALCIUM 10 MG PO TABS
20.0000 mg | ORAL_TABLET | Freq: Every day | ORAL | Status: DC
  Administered 2018-02-05 – 2018-02-10 (×5): 20 mg via ORAL
  Filled 2018-02-05 (×6): qty 2

## 2018-02-05 MED ORDER — POTASSIUM CHLORIDE CRYS ER 20 MEQ PO TBCR
40.0000 meq | EXTENDED_RELEASE_TABLET | Freq: Once | ORAL | Status: AC
  Administered 2018-02-05: 40 meq via ORAL
  Filled 2018-02-05: qty 2

## 2018-02-05 MED ORDER — WARFARIN - PHARMACIST DOSING INPATIENT: Freq: Every day | Status: DC

## 2018-02-05 MED ORDER — AZITHROMYCIN 250 MG PO TABS
250.0000 mg | ORAL_TABLET | Freq: Every day | ORAL | Status: AC
  Administered 2018-02-06 – 2018-02-09 (×4): 250 mg via ORAL
  Filled 2018-02-05 (×4): qty 1

## 2018-02-05 MED ORDER — SENNOSIDES-DOCUSATE SODIUM 8.6-50 MG PO TABS: 1.0000 | ORAL_TABLET | Freq: Every evening | ORAL | Status: DC | PRN

## 2018-02-05 MED ORDER — ALBUTEROL SULFATE (2.5 MG/3ML) 0.083% IN NEBU
2.5000 mg | INHALATION_SOLUTION | RESPIRATORY_TRACT | Status: DC | PRN
  Administered 2018-02-05: 2.5 mg via RESPIRATORY_TRACT
  Filled 2018-02-05: qty 3

## 2018-02-05 MED ORDER — DILTIAZEM HCL ER COATED BEADS 180 MG PO CP24
360.0000 mg | ORAL_CAPSULE | Freq: Every day | ORAL | Status: DC
  Administered 2018-02-05 – 2018-02-11 (×7): 360 mg via ORAL
  Filled 2018-02-05 (×4): qty 2
  Filled 2018-02-05: qty 1
  Filled 2018-02-05 (×3): qty 2

## 2018-02-05 MED ORDER — DM-GUAIFENESIN ER 30-600 MG PO TB12
1.0000 | ORAL_TABLET | Freq: Two times a day (BID) | ORAL | Status: DC | PRN
  Filled 2018-02-05: qty 1

## 2018-02-05 MED ORDER — IPRATROPIUM-ALBUTEROL 0.5-2.5 (3) MG/3ML IN SOLN
3.0000 mL | Freq: Four times a day (QID) | RESPIRATORY_TRACT | Status: DC
  Administered 2018-02-06 – 2018-02-07 (×5): 3 mL via RESPIRATORY_TRACT
  Filled 2018-02-05 (×4): qty 3

## 2018-02-05 MED ORDER — WARFARIN SODIUM 5 MG PO TABS
10.0000 mg | ORAL_TABLET | Freq: Once | ORAL | Status: AC
  Administered 2018-02-05: 10 mg via ORAL
  Filled 2018-02-05: qty 2

## 2018-02-05 MED ORDER — MOMETASONE FURO-FORMOTEROL FUM 100-5 MCG/ACT IN AERO
2.0000 | INHALATION_SPRAY | Freq: Two times a day (BID) | RESPIRATORY_TRACT | Status: DC
  Administered 2018-02-05 – 2018-02-11 (×12): 2 via RESPIRATORY_TRACT
  Filled 2018-02-05: qty 8.8

## 2018-02-05 MED ORDER — MIRTAZAPINE 15 MG PO TABS
15.0000 mg | ORAL_TABLET | Freq: Every day | ORAL | Status: DC
  Administered 2018-02-05 – 2018-02-07 (×3): 15 mg via ORAL
  Filled 2018-02-05 (×3): qty 1

## 2018-02-05 MED ORDER — AZITHROMYCIN 250 MG PO TABS
500.0000 mg | ORAL_TABLET | Freq: Every day | ORAL | Status: AC
  Administered 2018-02-05: 500 mg via ORAL
  Filled 2018-02-05: qty 2

## 2018-02-05 MED ORDER — OMEGA-3-ACID ETHYL ESTERS 1 G PO CAPS
1.0000 g | ORAL_CAPSULE | Freq: Every day | ORAL | Status: DC
  Administered 2018-02-05 – 2018-02-11 (×7): 1 g via ORAL
  Filled 2018-02-05 (×8): qty 1

## 2018-02-05 MED ORDER — MAGNESIUM SULFATE IN D5W 1-5 GM/100ML-% IV SOLN
1.0000 g | Freq: Once | INTRAVENOUS | Status: AC
  Administered 2018-02-05: 1 g via INTRAVENOUS
  Filled 2018-02-05: qty 100

## 2018-02-05 MED ORDER — DEXTROSE 50 % IV SOLN
50.0000 mL | Freq: Once | INTRAVENOUS | Status: AC
  Administered 2018-02-05: 50 mL via INTRAVENOUS
  Filled 2018-02-05: qty 50

## 2018-02-05 MED ORDER — MAGNESIUM SULFATE 2 GM/50ML IV SOLN
2.0000 g | Freq: Once | INTRAVENOUS | Status: DC
  Filled 2018-02-05: qty 50

## 2018-02-05 MED ORDER — INSULIN ASPART 100 UNIT/ML ~~LOC~~ SOLN: 0.0000 [IU] | Freq: Every day | SUBCUTANEOUS | Status: DC

## 2018-02-05 MED ORDER — DEXTROSE 50 % IV SOLN: 50.0000 mL | INTRAVENOUS | Status: DC | PRN

## 2018-02-05 MED ORDER — SENNOSIDES-DOCUSATE SODIUM 8.6-50 MG PO TABS: 1.0000 | ORAL_TABLET | Freq: Every day | ORAL | Status: DC | PRN

## 2018-02-05 MED ORDER — IPRATROPIUM-ALBUTEROL 0.5-2.5 (3) MG/3ML IN SOLN
3.0000 mL | RESPIRATORY_TRACT | Status: DC
  Administered 2018-02-05 (×5): 3 mL via RESPIRATORY_TRACT
  Filled 2018-02-05 (×5): qty 3

## 2018-02-05 MED ORDER — ZOLPIDEM TARTRATE 5 MG PO TABS
5.0000 mg | ORAL_TABLET | Freq: Every evening | ORAL | Status: DC | PRN
  Administered 2018-02-07: 5 mg via ORAL
  Filled 2018-02-05: qty 1

## 2018-02-05 MED ORDER — ONDANSETRON HCL 4 MG PO TABS: 4.0000 mg | ORAL_TABLET | Freq: Four times a day (QID) | ORAL | Status: DC | PRN

## 2018-02-05 MED ORDER — ACETAMINOPHEN 325 MG PO TABS
650.0000 mg | ORAL_TABLET | Freq: Four times a day (QID) | ORAL | Status: DC | PRN
  Administered 2018-02-05 – 2018-02-10 (×4): 650 mg via ORAL
  Filled 2018-02-05 (×4): qty 2

## 2018-02-05 MED ORDER — PANTOPRAZOLE SODIUM 40 MG PO TBEC
40.0000 mg | DELAYED_RELEASE_TABLET | Freq: Every day | ORAL | Status: DC
  Administered 2018-02-05 – 2018-02-11 (×7): 40 mg via ORAL
  Filled 2018-02-05 (×7): qty 1

## 2018-02-05 MED ORDER — ONDANSETRON HCL 4 MG/2ML IJ SOLN: 4.0000 mg | Freq: Four times a day (QID) | INTRAMUSCULAR | Status: DC | PRN

## 2018-02-05 MED ORDER — LEVOTHYROXINE SODIUM 112 MCG PO TABS
112.0000 ug | ORAL_TABLET | ORAL | Status: DC
  Administered 2018-02-05 – 2018-02-11 (×7): 112 ug via ORAL
  Filled 2018-02-05 (×8): qty 1

## 2018-02-05 MED ORDER — HEPARIN (PORCINE) 25000 UT/250ML-% IV SOLN
1700.0000 [IU]/h | INTRAVENOUS | Status: DC
  Administered 2018-02-05 – 2018-02-06 (×3): 1500 [IU]/h via INTRAVENOUS
  Administered 2018-02-07: 1700 [IU]/h via INTRAVENOUS
  Filled 2018-02-05 (×4): qty 250

## 2018-02-05 MED ORDER — ACETAMINOPHEN 650 MG RE SUPP: 650.0000 mg | Freq: Four times a day (QID) | RECTAL | Status: DC | PRN

## 2018-02-05 MED ORDER — METOPROLOL TARTRATE 12.5 MG HALF TABLET
12.5000 mg | ORAL_TABLET | Freq: Two times a day (BID) | ORAL | Status: DC
  Administered 2018-02-05 – 2018-02-11 (×12): 12.5 mg via ORAL
  Filled 2018-02-05 (×13): qty 1

## 2018-02-05 MED ORDER — SODIUM CHLORIDE 0.9 % IV SOLN
2.0000 g | INTRAVENOUS | Status: DC
  Administered 2018-02-05 – 2018-02-07 (×3): 2 g via INTRAVENOUS
  Filled 2018-02-05 (×5): qty 20

## 2018-02-05 MED ORDER — NITROGLYCERIN 0.4 MG SL SUBL: 0.4000 mg | SUBLINGUAL_TABLET | SUBLINGUAL | Status: DC | PRN

## 2018-02-05 MED ORDER — SODIUM CHLORIDE 0.9 % IV SOLN
INTRAVENOUS | Status: DC
  Administered 2018-02-05 (×2): via INTRAVENOUS
  Administered 2018-02-06: 1000 mL via INTRAVENOUS

## 2018-02-05 NOTE — ED Notes (Signed)
Accepting nurse accepts report at this time.

## 2018-02-05 NOTE — Progress Notes (Signed)
Inpatient Diabetes Program Recommendations  AACE/ADA: New Consensus Statement on Inpatient Glycemic Control (2015)  Target Ranges:  Prepandial:   less than 140 mg/dL      Peak postprandial:   less than 180 mg/dL (1-2 hours)      Critically ill patients:  140 - 180 mg/dL   Lab Results  Component Value Date   GLUCAP 127 (H) 02/05/2018   HGBA1C 8.9 (H) 08/18/2016    Review of Glycemic Control Results for Justin Garner, Justin Garner (MRN [id number]) as of 02/05/2018 13:57  Ref. Range 02/05/2018 03:17 02/05/2018 05:22 02/05/2018 08:13 02/05/2018 09:41  Glucose-Capillary Latest Ref Range: 70 - 99 mg/dL 65 (L) 176 (H) 84 127 (H)   Diabetes history: Type 2 dM Outpatient Diabetes medications: Glipizide 10 mg QD Current orders for Inpatient glycemic control: none  Inpatient Diabetes Program Recommendations:    Consider repeating A1C, last from 08/2016. Curious if he needs Glipizide at the same dosage at discharge?  Thanks, Bronson Curb, MSN, RNC-OB Diabetes Coordinator [phone] (8a-5p)

## 2018-02-05 NOTE — ED Notes (Addendum)
RN notified about pt low blood sugar,pt given a [place] sandwich and a diet ginger ale.

## 2018-02-05 NOTE — Evaluation (Signed)
Physical Therapy Evaluation Patient Details Name: Justin Garner MRN: [id number] DOB: [birth year] Today's Date: 02/05/2018   History of Present Illness  Pt is an 84 y/o male admitted secondary to generalized weakness and increased confusion. Thought to have acute metabolic encephalopathy secondary to multifactorial etiology. CT negative for acute abnormality. PMH includes a fib, PE, DM, HTN, CHF, and COPD.   Clinical Impression  Pt admitted secondary to problem above with deficits below. Pt with positive orthostatics during session (see vitals flowsheet), however, reported no symptoms of dizziness and/or lightheadedness. Pt was increasingly shaky with prolonged standing, so mobility limited to chair. Required min to min guard A for mobility using RW. Per pt, reports he lives alone, however, in the notes, sounds like he has support from granddaughter. Pt may be able to progress to home with HHPT services, if pt has necessary level of assist at home. If pt does not progress, will likely need short term SNF placement. Will continue to follow acutely to maximize functional mobility independence and safety.      Follow Up Recommendations Supervision/Assistance - 24 hour;Other (comment)(SNF vs HHPT pending progress)    Equipment Recommendations  Rolling walker with 5" wheels    Recommendations for Other Services OT consult     Precautions / Restrictions Precautions Precautions: Fall Restrictions Weight Bearing Restrictions: No      Mobility  Bed Mobility Overal bed mobility: Needs Assistance Bed Mobility: Supine to Sit     Supine to sit: Min guard     General bed mobility comments: Min guard for safety. Increased time to come to EOB.   Transfers Overall transfer level: Needs assistance Equipment used: Rolling walker (2 wheeled) Transfers: Sit to/from [facility] Sit to Stand: Min assist Stand pivot transfers: Min guard       General transfer comment: Min A  for steadying assist to stand. Noted pt very shaky with prolonged standing when taking orthostatics. Mobility limited to chair. Min guard for steadying assist to perform stand pivot to chair. Some SOB noted, however, sats at 97% on RA.   Ambulation/Gait                Stairs            Wheelchair Mobility    Modified Rankin (Stroke Patients Only)       Balance Overall balance assessment: Needs assistance Sitting-balance support: No upper extremity supported;Feet supported Sitting balance-Leahy Scale: Fair     Standing balance support: Bilateral upper extremity supported;During functional activity Standing balance-Leahy Scale: Poor Standing balance comment: Reliant on BUE support                              Pertinent Vitals/Pain Pain Assessment: No/denies pain    Home Living Family/patient expects to be discharged to:: Private residence Living Arrangements: Alone Available Help at Discharge: Family;Available PRN/intermittently Type of Home: Mobile home Home Access: Stairs to enter Entrance Stairs-Rails: Right;Left;Can reach both Entrance Stairs-Number of Steps: 5 Home Layout: One level Home Equipment: Walker - 2 wheels;Cane - single point Additional Comments: Per pt, he lives alone, however, may live with grandaughter per notes. Unsure as no family present.     Prior Function Level of Independence: Independent with assistive device(s)         Comments: ambulates with cane. Independent with ADLs.     Hand Dominance   Dominant Hand: Right    Extremity/Trunk Assessment   Upper Extremity Assessment  Upper Extremity Assessment: Defer to OT evaluation    Lower Extremity Assessment Lower Extremity Assessment: Generalized weakness    Cervical / Trunk Assessment Cervical / Trunk Assessment: Kyphotic  Communication   Communication: HOH  Cognition Arousal/Alertness: Awake/alert Behavior During Therapy: WFL for tasks  assessed/performed Overall Cognitive Status: No family/caregiver present to determine baseline cognitive functioning                                 General Comments: HIstory of impairment, however, unsure if pt is at baseline.      General Comments General comments (skin integrity, edema, etc.): Pt with positive orthostatic vitals; see vitals flowsheet. Pt asymptomatic, however, with increased shakiness, so mobility limited to chair.     Exercises     Assessment/Plan    PT Assessment Patient needs continued PT services  PT Problem List Decreased strength;Decreased balance;Decreased activity tolerance;Decreased mobility;Decreased knowledge of precautions;Decreased knowledge of use of DME;Decreased cognition       PT Treatment Interventions DME instruction;Gait training;Functional mobility training;Stair training;Therapeutic activities;Therapeutic exercise;Balance training;Patient/family education;Cognitive remediation    PT Goals (Current goals can be found in the Care Plan section)  Acute Rehab PT Goals Patient Stated Goal: to get warmed up  PT Goal Formulation: With patient Time For Goal Achievement: 02/19/18 Potential to Achieve Goals: Good    Frequency Min 3X/week   Barriers to discharge Other (comment) unsure of caregiver support    Co-evaluation               AM-PAC PT "6 Clicks" Daily Activity  Outcome Measure Difficulty turning over in bed (including adjusting bedclothes, sheets and blankets)?: A Little Difficulty moving from lying on back to sitting on the side of the bed? : Unable Difficulty sitting down on and standing up from a chair with arms (e.g., wheelchair, bedside commode, etc,.)?: Unable Help needed moving to and from a bed to chair (including a wheelchair)?: A Little Help needed walking in hospital room?: A Little Help needed climbing 3-5 steps with a railing? : A Lot 6 Click Score: 13    End of Session Equipment Utilized During  Treatment: Gait belt Activity Tolerance: Patient limited by fatigue Patient left: in chair;with call bell/phone within reach;with chair alarm set;with nursing/sitter in room Nurse Communication: Mobility status PT Visit Diagnosis: Unsteadiness on feet (R26.81);Muscle weakness (generalized) (M62.81)    Time: 1916-6060 PT Time Calculation (min) (ACUTE ONLY): 30 min   Charges:   PT Evaluation $PT Eval Moderate Complexity: 1 Mod PT Treatments $Therapeutic Activity: 8-22 mins        Leighton Ruff, PT, DPT  Acute Rehabilitation Services  Pager: [phone] Office: [phone]   Rudean Hitt 02/05/2018, 1:08 PM

## 2018-02-05 NOTE — ED Notes (Signed)
Accepting nurse to call back for report

## 2018-02-05 NOTE — ED Notes (Signed)
Pearline Cables top (UC) sent down with UA.

## 2018-02-05 NOTE — Progress Notes (Signed)
Detailed H&P earlier this am.  Patient presented to ED via EMS after family found patient unable to rise from recliner and incontinent of urine. Workup reveals acute encephalopathy likely related to  hypoglycemia, hypokalemia, acute on chronic kidney failure III, dehydration mild copd exacerbation. tsh 4.5. Also INR subtherapeurtic.   PE Gen. Awake alert oriented x2 no acute distress CV: irregularly irregular no mgr trace LE edema Resp: no increased work of breathing. BS somewhat distant Abd: flat soft +BS no guarding or rebounding  Plan:  IV fluids, correction of electrolyte imbalances,azithromycin, PT/OT, free t4, hold glipizide, gentle IV fluids, hold lasix and lisinoprin, continue BB, cardizem and coumadin with IV heparin bridge. PT/OT     Santiago Glad m. Black, NP

## 2018-02-05 NOTE — Progress Notes (Signed)
ANTICOAGULATION CONSULT NOTE - Initial Consult  Pharmacy Consult for heparin and warfarin Indication: h/o PE and Afib  No Known Allergies  Patient Measurements: Height: 6\' 2"  (188 cm) Weight: 220 lb (99.8 kg) IBW/kg (Calculated) : 82.2  Vital Signs: Temp: 98.4 F (36.9 C) (11/18 2153) Temp Source: Oral (11/18 2153) BP: 101/78 (11/19 0554) Pulse Rate: 58 (11/19 0230)  Labs: Recent Labs    02/04/18 2223 02/05/18 0330  HGB 13.8 12.9*  HCT 41.3 41.0  PLT 250 257  LABPROT 16.8*  --   INR 1.37  --   CREATININE 1.52* 1.39*    Estimated Creatinine Clearance: 50.8 mL/min (A) (by C-G formula based on SCr of 1.39 mg/dL (H)).   Medical History: Past Medical History:  Diagnosis Date  . ASCVD (arteriosclerotic cardiovascular disease)   . Cancer [facility]) 2009   Upper lobectomy;radiation; chemotherapy  . Chronic systolic CHF (congestive heart failure) ([place])   . CKD (chronic kidney disease), stage II   . COPD (chronic obstructive pulmonary disease) ([place])   . Coronary artery disease   . Diabetes mellitus   . GERD (gastroesophageal reflux disease)   . Hyperlipidemia   . Hypertension   . Leukocytosis 07/12/2015  . PAF (paroxysmal atrial fibrillation) (HCC)    Chronic warfarin therapy  . Primary cancer of right upper lobe of lung ([place]) 06/30/2013   RUL lobectomy 2009 with chest wall resection for superior sulcus tumor Postoperative radiation and chemotherapy; Dr Earlie Server Oncology monitor [phone]  . Pulmonary embolism (HCC)    PMH of  . Thyroid disease     Assessment: 84yo male presents w/ generalized weakness similar to previous episodes of UTI and pneumonia, to continue Coumadin for Afib and h/o PE, INR below goal w/ last dose taken 11/18 >> to also start heparin bridge.  Goal of Therapy:  INR 2-3 Heparin level 0.3-0.7 units/ml Monitor platelets by anticoagulation protocol: Yes   Plan:  Will start heparin gtt at 1500 units/hr and monitor heparin levels and CBC; will give  boosted Coumadin dose of 10mg  x1 and monitor INR for dose adjustments.  Wynona Neat, PharmD, BCPS  02/05/2018,5:59 AM

## 2018-02-05 NOTE — Evaluation (Signed)
Occupational Therapy Evaluation Patient Details Name: Justin Garner MRN: [id number] DOB: Mar 06, 1933 Today's Date: 02/05/2018    History of Present Illness Pt is an 84 y/o male admitted secondary to generalized weakness and increased confusion. Thought to have acute metabolic encephalopathy secondary to multifactorial etiology. CT negative for acute abnormality. PMH includes a fib, PE, DM, HTN, CHF, and COPD.    Clinical Impression   Patient supine in bed and agreeable to participate with OT, cleared with RN for participation (as pt with fever). PTA patient reports independent with self care and mobility, granddaughter checks in on him multiple times a day. Patient admitted for above and limited by decreased activity tolerance, generalized weakness, impaired balance and cognition. Pt currently requires mod assist for bed mobility, mod assist for UB ADLs, total assist for LB ADLs. Limited eval due to patient fatigue level, posterior lean sitting unsupported EOB. Pt will benefit from continued OT services while admitted and based on performance today, recommend SNF rehab at dc in order to maximize independence and return to PLOF.  Will continue to follow.     Follow Up Recommendations  SNF;Supervision/Assistance - 24 hour    Equipment Recommendations  3 in 1 bedside commode    Recommendations for Other Services       Precautions / Restrictions Precautions Precautions: Fall Restrictions Weight Bearing Restrictions: No      Mobility Bed Mobility Overal bed mobility: Needs Assistance Bed Mobility: Supine to Sit;Sit to Supine     Supine to sit: Mod assist Sit to supine: Mod assist   General bed mobility comments: mod assist to ascend trunk from supine and mod assist to manage B LEs to transition back to supine   Transfers                 General transfer comment: deferred at time of eval due to increased fatigue     Balance Overall balance assessment: Needs  assistance Sitting-balance support: Bilateral upper extremity supported;Feet supported Sitting balance-Leahy Scale: Poor Sitting balance - Comments: posterior lean and requires min assist to maintain balance                                   ADL either performed or assessed with clinical judgement   ADL Overall ADL's : Needs assistance/impaired     Grooming: Set up;Bed level   Upper Body Bathing: Sitting;Moderate assistance   Lower Body Bathing: Total assistance;Bed level   Upper Body Dressing : Moderate assistance;Sitting   Lower Body Dressing: Total assistance;Sitting/lateral leans     Toilet Transfer Details (indicate cue type and reason): deferred due to safety         Functional mobility during ADLs: Moderate assistance(bed mobility only)       Vision         Perception     Praxis      Pertinent Vitals/Pain       Hand Dominance Right   Extremity/Trunk Assessment Upper Extremity Assessment Upper Extremity Assessment: Generalized weakness   Lower Extremity Assessment Lower Extremity Assessment: Defer to PT evaluation   Cervical / Trunk Assessment Cervical / Trunk Assessment: Kyphotic   Communication Communication Communication: HOH   Cognition Arousal/Alertness: Awake/alert Behavior During Therapy: WFL for tasks assessed/performed Overall Cognitive Status: Impaired/Different from baseline Area of Impairment: Orientation;Attention;Memory;Safety/judgement;Awareness;Problem solving;Following commands                 Orientation Level: Disoriented to;Time  Current Attention Level: Selective Memory: Decreased short-term memory Following Commands: Follows one step commands with increased time;Follows one step commands consistently Safety/Judgement: Decreased awareness of safety;Decreased awareness of deficits Awareness: Emergent Problem Solving: Slow processing;Decreased initiation;Requires verbal cues General Comments: history  of impairment but grandaughter reports "He is not himself" with increased confusion (typically doesnt keep up with the month)    General Comments  grandaughter present and supportive; VSS, supplemental oxygen at 3L via [place] with saturations at 94% supine and 89-90% seated EOB    Exercises     Shoulder Instructions      Home Living Family/patient expects to be discharged to:: Private residence Living Arrangements: Alone Available Help at Discharge: Family;Available PRN/intermittently Type of Home: Mobile home Home Access: Stairs to enter Entrance Stairs-Number of Steps: 5 Entrance Stairs-Rails: Right;Left;Can reach both Home Layout: One level     Bathroom Shower/Tub: Teacher, early years/pre: Standard     Home Equipment: Environmental consultant - 2 wheels;Cane - single point   Additional Comments: pt lives alone but has supportive grandaughter that can check on him intermittently       Prior Functioning/Environment Level of Independence: Independent with assistive device(s)        Comments: ambulates with cane. Independent with ADLs.        OT Problem List: Decreased strength;Impaired balance (sitting and/or standing);Decreased activity tolerance;Decreased cognition;Decreased safety awareness;Decreased knowledge of use of DME or AE;Cardiopulmonary status limiting activity      OT Treatment/Interventions: Self-care/ADL training;Therapeutic exercise;Energy conservation;DME and/or AE instruction;Therapeutic activities;Balance training;Patient/family education;Cognitive remediation/compensation    OT Goals(Current goals can be found in the care plan section) Acute Rehab OT Goals Patient Stated Goal: per grandaughter: to get him home OT Goal Formulation: With family Time For Goal Achievement: 02/19/18 Potential to Achieve Goals: Good  OT Frequency: Min 2X/week   Barriers to D/C: Decreased caregiver support          Co-evaluation              AM-PAC PT "6 Clicks" Daily  Activity     Outcome Measure Help from another person eating meals?: None Help from another person taking care of personal grooming?: A Little Help from another person toileting, which includes using toliet, bedpan, or urinal?: Total Help from another person bathing (including washing, rinsing, drying)?: A Lot Help from another person to put on and taking off regular upper body clothing?: A Lot Help from another person to put on and taking off regular lower body clothing?: A Lot 6 Click Score: 14   End of Session Equipment Utilized During Treatment: Oxygen Nurse Communication: Mobility status;Precautions  Activity Tolerance: Patient limited by fatigue Patient left: in bed;with call bell/phone within reach;with bed alarm set;Other (comment);with family/visitor present(respiratory therapist in room)  OT Visit Diagnosis: Other abnormalities of gait and mobility (R26.89);Muscle weakness (generalized) (M62.81)                Time: 4665-9935 OT Time Calculation (min): 23 min Charges:  OT General Charges $OT Visit: 1 Visit OT Evaluation $OT Eval Moderate Complexity: [place], OT Acute Rehabilitation Services Pager [phone] Office [phone]   Delight Stare 02/05/2018, 5:00 PM

## 2018-02-05 NOTE — H&P (Signed)
History and Physical    Justin Garner NAT:[id number] DOB: 08-05-1932 DOA: 02/04/2018  Referring MD/NP/PA:   PCP: [name], MD   Patient coming from:  The patient is coming from home.  At baseline, pt is independent for most of ADL.        Chief Complaint: Generalized weakness, confusion, urinary incontinence, cough  HPI: Justin Garner is a 84 y.o. male with medical history significant of hypertension, hyperlipidemia, diabetes mellitus, COPD, GERD, hypothyroidism, depression, remote lung cancer (s/p of lobectomy and chemotherapy), CHF with EF 35%, CKD-2, CAD, atrial fibrillation and PE on Coumadin, who presents with generalized weakness, confusion, urinary incontinence and cough.  Per patient's granddaughter, patient has been feeling weak in the past several days. He has very low energy, sitting in the recliner all the time.  Normally patient is oriented x3, today he is mildly confused.  Currently patient is oriented to place and person but not to time.  Patient had urinary incontinence at home.  Patient had temperature 99.5 at home, but no fever in the ED.  Patient has dry cough, no shortness of breath or respiratory distress.  Does not seem to have chest pain.  Patient does not have nausea, vomiting, diarrhea, abdominal pain, symptoms for UTI.  No unilateral tingling or numbness in extremities.  No facial droop or slurred speech.  Patient had a low blood sugar 66, which improved to 85 after letting patient drink orange ED.  ED Course: pt was found to have WBC 14.0, negative urinalysis, BNP 239.7, INR 1.37, potassium 2.8, slightly worsening renal function, temperature normal, soft blood pressure, heart rate 37-137, currently 69, oxygen saturation 94% on room air.  Chest x-ray showed cardiomegaly and small right pleural effusion.  Patient is placed on telemetry bed for observation.  Review of Systems:   General: no fevers, chills, no body weight gain, has poor appetite, has  fatigue HEENT: no blurry vision, hearing changes or sore throat Respiratory: no dyspnea, has coughing, no wheezing CV: no chest pain, no palpitations GI: no nausea, vomiting, abdominal pain, diarrhea, constipation GU: no dysuria, burning on urination, increased urinary frequency, hematuria  Ext: has leg edema Neuro: no unilateral weakness, numbness, or tingling, no vision change or hearing loss. Has confusion and urinary incontinence. Skin: no rash, no skin tear. MSK: No muscle spasm, no deformity, no limitation of range of movement in spin Heme: No easy bruising.  Travel history: No recent long distant travel.  Allergy: No Known Allergies  Past Medical History:  Diagnosis Date  . ASCVD (arteriosclerotic cardiovascular disease)   . Cancer [facility]) 2009   Upper lobectomy;radiation; chemotherapy  . Chronic systolic CHF (congestive heart failure) ([place])   . CKD (chronic kidney disease), stage II   . COPD (chronic obstructive pulmonary disease) ([place])   . Coronary artery disease   . Diabetes mellitus   . GERD (gastroesophageal reflux disease)   . Hyperlipidemia   . Hypertension   . Leukocytosis 07/12/2015  . PAF (paroxysmal atrial fibrillation) (H[facility])    Chronic warfarin therapy  . Primary cancer of right upper lobe of lung ([place]) 06/30/2013   RUL lobectomy 2009 with chest wall resection for superior sulcus tumor Postoperative radiation and chemotherapy; Dr Earlie Server Oncology monitor [phone]  . Pulmonary embolism (HCC)    PMH of  . Thyroid disease     Past Surgical History:  Procedure Laterality Date  . CAROTID STENT    . CHOLECYSTECTOMY    . FIBEROPTIC BRONCHOSCOPY AND MEIASTINOSCOPY  06/07/2007  .  HERNIA REPAIR    . INGUINAL HERNIA REPAIR  10/10/2010  . LEFT HEART CATHETERIZATION WITH CORONARY ANGIOGRAM N/A 08/19/2013   Procedure: LEFT HEART CATHETERIZATION WITH CORONARY ANGIOGRAM;  Surgeon: Jettie Booze, MD;  Location: [facility] CATH LAB;  Service: Cardiovascular;  Laterality: N/A;   . R VATS,THORACOTOMY AND UPPER LOBECTOMY  08/02/2007    Social History:  reports that he quit smoking about 16 years ago. His smoking use included cigarettes. He has a 50.00 pack-year smoking history. He has quit using smokeless tobacco. He reports that he does not drink alcohol or use drugs.  Family History:  Family History  Problem Relation Age of Onset  . Heart disease Father        Heart attack  . Heart attack Father   . Diabetes Brother   . Cancer Neg Hx   . Stroke Neg Hx   . Hypertension Neg Hx      Prior to Admission medications   Medication Sig Start Date End Date Taking? Authorizing Provider  albuterol (PROVENTIL HFA;VENTOLIN HFA) 108 (90 Base) MCG/ACT inhaler Inhale 2 puffs into the lungs every 6 (six) hours as needed for wheezing or shortness of breath. 11/16/15  Yes Mikhail, [place], DO  Artificial Tear Ointment (DRY EYES OP) Place 1 drop into both eyes as needed (dry eye).   Yes [provider]  atorvastatin (LIPITOR) 20 MG tablet TAKE 1 TABLET BY MOUTH  DAILY Patient taking differently: Take 20 mg by mouth daily at 6 PM.  05/09/17  Yes Jettie Booze, MD  budesonide-formoterol [facility]) 80-4.5 MCG/ACT inhaler Inhale 2 puffs into the lungs every morning. 11/16/15  Yes Mikhail, Velta Addison, DO  dextromethorphan-guaiFENesin (MUCINEX DM) 30-600 MG 12hr tablet Take 1 tablet by mouth 2 (two) times daily as needed for cough. 11/16/15  Yes Mikhail, Velta Addison, DO  diltiazem (CARDIZEM CD) 360 MG 24 hr capsule Take 1 capsule (360 mg total) by mouth daily. 09/10/17  Yes Jettie Booze, MD  fish oil-omega-3 fatty acids 1000 MG capsule Take 1 g by mouth daily.    Yes [provider]  furosemide (LASIX) 20 MG tablet Take 2 tablets (40 mg total) by mouth daily. 06/27/17  Yes Simmons, Brittainy M, PA-C  glipiZIDE (GLUCOTROL XL) 10 MG 24 hr tablet Take 10 mg by mouth daily. 11/17/16  Yes [provider]  levothyroxine (SYNTHROID, LEVOTHROID) 112 MCG tablet Take  112 mcg by mouth daily.     Yes [provider]  lisinopril (PRINIVIL,ZESTRIL) 5 MG tablet Take 0.5 tablets (2.5 mg total) by mouth daily. 01/03/17  Yes Jettie Booze, MD  metoprolol tartrate (LOPRESSOR) 25 MG tablet TAKE ONE-HALF TABLET BY  MOUTH TWO TIMES DAILY Patient taking differently: Take 12.5 mg by mouth 2 (two) times daily.  05/09/17  Yes Jettie Booze, MD  mirtazapine (REMERON) 15 MG tablet Take 15 mg by mouth at bedtime.   Yes [provider]  nitroGLYCERIN (NITROSTAT) 0.4 MG SL tablet Place 1 tablet (0.4 mg total) under the tongue every 5 (five) minutes as needed for chest pain up to 3 doses 08/03/16  Yes Jettie Booze, MD  omeprazole (PRILOSEC) 20 MG capsule Take 20 mg by mouth daily.    Yes [provider]  sennosides-docusate sodium (SENOKOT-S) 8.6-50 MG tablet Take 1 tablet by mouth daily as needed for constipation.    Yes [provider]  warfarin (COUMADIN) 5 MG tablet TAKE AS DIRECTED BY  COUMADIN CLINIC Patient taking differently: Take 5-7.5 mg by mouth  See admin instructions. Take 1 and 1/2 tablets on Tuesday and Thursday then take 1 tablet all the other days 12/03/17  Yes Jettie Booze, MD    Physical Exam: Vitals:   02/05/18 0215 02/05/18 0230 02/05/18 0300 02/05/18 0400  BP: 120/70 103/79 125/82 129/86  Pulse: 69 (!) 58 (!) 40 99  Resp: 19 19 19  (!) 21  Temp:      TempSrc:      SpO2: 97% 96% 96% 97%   General: Not in acute distress HEENT:       Eyes: PERRL, EOMI, no scleral icterus.       ENT: No discharge from the ears and nose, no pharynx injection, no tonsillar enlargement.        Neck: No JVD, no bruit, no mass felt. Heme: No neck lymph node enlargement. Cardiac: S1/S2, RRR, No murmurs, No gallops or rubs. Respiratory: No rales, wheezing, rhonchi or rubs. GI: Soft, nondistended, nontender, no rebound pain, no organomegaly, BS present. GU: No hematuria Ext: 1+ pitting leg edema bilaterally. 2+DP/PT  pulse bilaterally. Musculoskeletal: No joint deformities, No joint redness or warmth, no limitation of ROM in spin. Skin: No rashes.  Neuro: mildly confused, oriented to place and person, but not to time, cranial nerves II-XII grossly intact, moves all extremities normally. Psych: Patient is not psychotic, no suicidal or hemocidal ideation.  Labs on Admission: I have personally reviewed following labs and imaging studies  CBC: Recent Labs  Lab 02/04/18 2223 02/05/18 0330  WBC 14.0* 13.6*  NEUTROABS 10.5*  --   HGB 13.8 12.9*  HCT 41.3 41.0  MCV 93.9 95.8  PLT 250 397   Basic Metabolic Panel: Recent Labs  Lab 02/04/18 2223  NA 139  K 2.8*  CL 103  CO2 24  GLUCOSE 66*  BUN 30*  CREATININE 1.52*  CALCIUM 8.1*   GFR: CrCl cannot be calculated (Unknown ideal weight.). Liver Function Tests: No results for input(s): AST, ALT, ALKPHOS, BILITOT, PROT, ALBUMIN in the last 168 hours. No results for input(s): LIPASE, AMYLASE in the last 168 hours. No results for input(s): AMMONIA in the last 168 hours. Coagulation Profile: Recent Labs  Lab 02/04/18 2223  INR 1.37   Cardiac Enzymes: No results for input(s): CKTOTAL, CKMB, CKMBINDEX, TROPONINI in the last 168 hours. BNP (last 3 results) No results for input(s): PROBNP in the last 8760 hours. HbA1C: No results for input(s): HGBA1C in the last 72 hours. CBG: Recent Labs  Lab 02/04/18 2236 02/04/18 2301 02/05/18 0317  GLUCAP 61* 85 65*   Lipid Profile: No results for input(s): CHOL, HDL, LDLCALC, TRIG, CHOLHDL, LDLDIRECT in the last 72 hours. Thyroid Function Tests: No results for input(s): TSH, T4TOTAL, FREET4, T3FREE, THYROIDAB in the last 72 hours. Anemia Panel: No results for input(s): VITAMINB12, FOLATE, FERRITIN, TIBC, IRON, RETICCTPCT in the last 72 hours. Urine analysis:    Component Value Date/Time   COLORURINE YELLOW 02/05/2018 0122   APPEARANCEUR CLEAR 02/05/2018 0122   LABSPEC 1.014 02/05/2018 0122    PHURINE 5.0 02/05/2018 0122   GLUCOSEU NEGATIVE 02/05/2018 0122   HGBUR NEGATIVE 02/05/2018 0122   BILIRUBINUR NEGATIVE 02/05/2018 0122   KETONESUR NEGATIVE 02/05/2018 0122   PROTEINUR NEGATIVE 02/05/2018 0122   UROBILINOGEN 4.0 (H) 12/27/2013 2225   NITRITE NEGATIVE 02/05/2018 0122   LEUKOCYTESUR NEGATIVE 02/05/2018 0122   Sepsis Labs: @LABRCNTIP (procalcitonin:4,lacticidven:4) )No results found for this or any previous visit (from the past 240 hour(s)).   Radiological Exams on Admission: Dg Chest 2 View  Result Date: 02/04/2018 CLINICAL DATA:  Cough for a few days EXAM: CHEST - 2 VIEW COMPARISON:  May 16, 2017 FINDINGS: Blunting of the right costophrenic angle is stable. Mild interstitial prominence is unchanged. Stable cardiomegaly. No other acute abnormalities or changes. IMPRESSION: 1. Cardiomegaly. 2. Stable mildly prominent interstitial markings, could be due to the patient's known underlying emphysematous changes or mild chronic edema. Recommend clinical correlation. 3. Chronic blunting of the right costophrenic angle consistent with a small effusion seen on previous CT imaging. 4. No other acute abnormalities. Electronically Signed   By: Dorise Bullion III M.D   On: 02/04/2018 23:06     EKG: Independently reviewed.  Atrial fibrillation, QTc 459, PVC, right bundle blockade, low voltage, nonspecific T wave change.  Assessment/Plan Principal Problem:   Generalized weakness Active Problems:   Chronic atrial fibrillation   Pulmonary embolism (HCC)   Essential hypertension, benign   Hypokalemia   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 (HCC)   Chronic systolic CHF (congestive heart failure) (HCC)   Hypothyroidism   HLD (hyperlipidemia)   Chronic obstructive pulmonary disease (HCC)   Leukocytosis   Acute on chronic kidney failure-II   Acute metabolic encephalopathy   Hypoglycemia   Generalized weakness: Etiology is not clear, likely due to multifactorial etiology, including  worsening renal function, dehydration, hypokalemia and slightly worsening COPD.  -Placed on telemetry bed for observation - IV fluid: Patient received 1 L normal saline bolus in ED - Correct hypokalemia - Started azithromycin for cough - check TSH - PT/OT  Acute metabolic encephalopathy: Etiology is not clear.  Patient had urinary incontinence, but no focal neurologic findings on physical examination.  Likely multifactorial etiology as above, in addition delirium is also possible. Since patient is on Coumadin, will get CT scan to rule out intracranial abnormalities. -CT scan of head -Frequent neuro check  Pulmonary embolism: on Coumadin, but INR is subtherapeutic, 1.37. -Continue Coumadin per pharm -Start IV heparin as bridge  Atrial Fibrillation: CHA2DS2-VASc Score is 6, needs oral anticoagulation. Patient is on Coumadin at home. INR is subtherapeutic 1.37 on admission.  -Continue metoprolol, Cardizem - Coumadin as above   HTN:  -Continue metoprolol, Cardizem -Hold lisinopril and Lasix due to worsening renal function -IV hydralazine prn  Hypokalemia: K= 2.8 on admission. - Repleted - Check Mg level - Give 1 g of magnesium sulfate  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 ([place]): Last A1c 8.9 on 08/18/17, poorly controled. Patient is taking glipizide at home.  Patient has hypoglycemia -Hold glipizide -Check CBG every 2 hour -PRN D50  Hypoglycemia: -As above  Chronic systolic CHF (congestive heart failure) ([place]): 2D echo on 08/20/2016 showed EF 35-40%.  Patient has 1+ leg edema, but no respiratory distress.  CHF seems to be compensated. -Hold Lasix due to worsening renal function -Check BMP  Hypothyroidism: Last TSH was 2.180 on 08/18/16 -Continue home Synthroid  HLD (hyperlipidemia): -lipitor  Chronic obstructive pulmonary disease and leukocytosis: Patient does not have worsening respiratory distress, but has cough and leukocytosis.  Patient may have mild early stage of COPD  exacerbation. -Start Z-Pak -Follow-up of blood culture and sputum culture  Acute on chronic kidney failure-II: Baseline creatinine 1.2.  His creatinine is 1.52, BUN 30, slightly worsening than baseline. - IV fluid: 1 L normal saline bolus -Hold Lasix and lisinopril   DVT ppx: on Coumadin and IV heparin Code Status: Full code Family Communication: Yes, patient's granddaughter   at bed side Disposition Plan:  Anticipate discharge back to  previous home environment Consults called:  none Admission status: Obs / tele      Date of Service 02/05/2018    Ivor Costa [facility] Pager [phone]  If 7PM-7AM, please contact night-coverage [email or web address] Password [email or web address] 02/05/2018, 4:22 AM

## 2018-02-05 NOTE — Progress Notes (Signed)
Foley catheter inserted as ordered and per protocol. 16FR placed with NT Itzel assistance in room. Pt urine return was tea color, bloody and sediments. Foley unclamped and draining. Pt resting comfortably in bed with call light within reach. Delia Heady RN

## 2018-02-05 NOTE — Progress Notes (Signed)
Patient with productive cough. Small amount of bloody sputum produced. MD notified. No further orders at this time. Justin Garner

## 2018-02-05 NOTE — Progress Notes (Signed)
Patient with dark, tea colored urine in standard collection bag. Blood clots found in condom catheter, on patient's thigh and in bed. Paged evening provider. Will await response and monitor condition. Justin Garner

## 2018-02-05 NOTE — Progress Notes (Addendum)
ANTICOAGULATION CONSULT NOTE - follow up Pharmacy Consult for heparin and warfarin Indication: h/o PE and Afib  No Known Allergies  Patient Measurements: Height: 6\' 2"  (188 cm) Weight: 220 lb (99.8 kg) IBW/kg (Calculated) : 82.2  Heparin dosing wt: 99.8 kg  Vital Signs: Temp: 99.3 F (37.4 C) (11/19 1308) Temp Source: Oral (11/19 1308) BP: 110/60 (11/19 0936) Pulse Rate: 59 (11/19 1120)  Labs: Recent Labs    02/04/18 2223 02/05/18 0330 02/05/18 1311  HGB 13.8 12.9*  --   HCT 41.3 41.0  --   PLT 250 257  --   LABPROT 16.8*  --   --   INR 1.37  --   --   HEPARINUNFRC  --   --  0.34  CREATININE 1.52* 1.39* 1.34*    Estimated Creatinine Clearance: 52.7 mL/min (A) (by C-G formula based on SCr of 1.34 mg/dL (H)).   Medical History: Past Medical History:  Diagnosis Date  . ASCVD (arteriosclerotic cardiovascular disease)   . Cancer [facility]) 2009   Upper lobectomy;radiation; chemotherapy  . Chronic systolic CHF (congestive heart failure) ([place])   . CKD (chronic kidney disease), stage II   . COPD (chronic obstructive pulmonary disease) ([place])   . Coronary artery disease   . Diabetes mellitus   . GERD (gastroesophageal reflux disease)   . Hyperlipidemia   . Hypertension   . Leukocytosis 07/12/2015  . PAF (paroxysmal atrial fibrillation) (HCC)    Chronic warfarin therapy  . Primary cancer of right upper lobe of lung ([place]) 06/30/2013   RUL lobectomy 2009 with chest wall resection for superior sulcus tumor Postoperative radiation and chemotherapy; Dr Earlie Server Oncology monitor [phone]  . Pulmonary embolism (HCC)    PMH of  . Thyroid disease     Assessment: 84yo male presented 02/04/18 w/ generalized weakness similar to previous episodes of UTI and pneumonia, to continue Coumadin for Afib and h/o PE.  INR on admit was below goal w/ last dose taken 11/18 >>patient was started on IV heparin bridge.  8 hour heparin level is 0.34 on heparin drip , therapeutic level.  No  bleeding reported.   Goal of Therapy:  INR 2-3 Heparin level 0.3-0.7 units/ml Monitor platelets by anticoagulation protocol: Yes   Plan:  Continue IV heparin gtt at 1500 units/hr,  Recheck 6h heparin level tonight to confirm remains therapeutic. Coumadin as previously orderedf 10mg  x1 today.  Monitor daily  heparin levels, INR and CBC  Thank you for allowing pharmacy to be part of this patients care team.  Nicole Cella, RPh Clinical Pharmacist Please check AMION for all [place] phone numbers After 10:00 PM, call [place] 02/05/2018,2:26 PM

## 2018-02-05 NOTE — ED Notes (Signed)
PT sitting up eating breakfast

## 2018-02-05 NOTE — ED Notes (Signed)
Justin Garner, pt granddaughter, [phone]

## 2018-02-06 DIAGNOSIS — E1122 Type 2 diabetes mellitus with diabetic chronic kidney disease: Secondary | ICD-10-CM | POA: Diagnosis not present

## 2018-02-06 DIAGNOSIS — E876 Hypokalemia: Secondary | ICD-10-CM | POA: Diagnosis not present

## 2018-02-06 DIAGNOSIS — N179 Acute kidney failure, unspecified: Secondary | ICD-10-CM | POA: Diagnosis not present

## 2018-02-06 DIAGNOSIS — G9341 Metabolic encephalopathy: Secondary | ICD-10-CM | POA: Diagnosis not present

## 2018-02-06 LAB — BASIC METABOLIC PANEL
ANION GAP: 10 (ref 5–15)
Anion gap: 14 (ref 5–15)
BUN: 21 mg/dL (ref 8–23)
BUN: 24 mg/dL — AB (ref 8–23)
CALCIUM: 8 mg/dL — AB (ref 8.9–10.3)
CHLORIDE: 104 mmol/L (ref 98–111)
CO2: 22 mmol/L (ref 22–32)
CO2: 24 mmol/L (ref 22–32)
CREATININE: 1.13 mg/dL (ref 0.61–1.24)
CREATININE: 1.45 mg/dL — AB (ref 0.61–1.24)
Calcium: 7.9 mg/dL — ABNORMAL LOW (ref 8.9–10.3)
Chloride: 106 mmol/L (ref 98–111)
GFR calc Af Amer: 50 mL/min — ABNORMAL LOW (ref >60)
GFR calc Af Amer: >60 mL/min (ref >60)
GFR calc non Af Amer: 43 mL/min — ABNORMAL LOW (ref >60)
GFR calc non Af Amer: 58 mL/min — ABNORMAL LOW (ref >60)
GLUCOSE: 101 mg/dL — AB (ref 70–99)
Glucose, Bld: 87 mg/dL (ref 70–99)
Potassium: 3 mmol/L — ABNORMAL LOW (ref 3.5–5.1)
Potassium: 3.1 mmol/L — ABNORMAL LOW (ref 3.5–5.1)
Sodium: 140 mmol/L (ref 135–145)
Sodium: 140 mmol/L (ref 135–145)

## 2018-02-06 LAB — CBC
HCT: 39.4 % (ref 39.0–52.0)
HCT: 40.9 % (ref 39.0–52.0)
Hemoglobin: 13.3 g/dL (ref 13.0–17.0)
Hemoglobin: 13.4 g/dL (ref 13.0–17.0)
MCH: 30.8 pg (ref 26.0–34.0)
MCH: 31.8 pg (ref 26.0–34.0)
MCHC: 32.5 g/dL (ref 30.0–36.0)
MCHC: 34 g/dL (ref 30.0–36.0)
MCV: 93.4 fL (ref 80.0–100.0)
MCV: 94.7 fL (ref 80.0–100.0)
NRBC: 0 % (ref 0.0–0.2)
PLATELETS: 238 10*3/uL (ref 150–400)
Platelets: 247 10*3/uL (ref 150–400)
RBC: 4.32 MIL/uL (ref 4.22–5.81)
RBC: 4.22 MIL/uL (ref 4.22–5.81)
RDW: 14.9 % (ref 11.5–15.5)
RDW: 14.8 % (ref 11.5–15.5)
WBC: 15.8 10*3/uL — ABNORMAL HIGH (ref 4.0–10.5)
WBC: 17.4 10*3/uL — ABNORMAL HIGH (ref 4.0–10.5)
nRBC: 0 % (ref 0.0–0.2)

## 2018-02-06 LAB — PROTIME-INR
INR: 1.58
INR: 1.49
PROTHROMBIN TIME: 18.7 s — AB (ref 11.4–15.2)
PROTHROMBIN TIME: 17.8 s — AB (ref 11.4–15.2)

## 2018-02-06 LAB — HEPARIN LEVEL (UNFRACTIONATED)
HEPARIN UNFRACTIONATED: 0.43 [IU]/mL (ref 0.30–0.70)
Heparin Unfractionated: 0.48 IU/mL (ref 0.30–0.70)

## 2018-02-06 LAB — GLUCOSE, CAPILLARY
Glucose-Capillary: 82 mg/dL (ref 70–99)
Glucose-Capillary: 114 mg/dL — ABNORMAL HIGH (ref 70–99)
Glucose-Capillary: 103 mg/dL — ABNORMAL HIGH (ref 70–99)
Glucose-Capillary: 111 mg/dL — ABNORMAL HIGH (ref 70–99)

## 2018-02-06 LAB — MAGNESIUM: Magnesium: 1.1 mg/dL — ABNORMAL LOW (ref 1.7–2.4)

## 2018-02-06 LAB — T4, FREE: Free T4: 1.36 ng/dL (ref 0.82–1.77)

## 2018-02-06 MED ORDER — WARFARIN SODIUM 5 MG PO TABS
5.0000 mg | ORAL_TABLET | Freq: Once | ORAL | Status: AC
  Administered 2018-02-06: 5 mg via ORAL
  Filled 2018-02-06: qty 1

## 2018-02-06 MED ORDER — MAGNESIUM SULFATE 2 GM/50ML IV SOLN
2.0000 g | Freq: Once | INTRAVENOUS | Status: AC
  Administered 2018-02-06: 2 g via INTRAVENOUS
  Filled 2018-02-06: qty 50

## 2018-02-06 MED ORDER — INSULIN ASPART 100 UNIT/ML ~~LOC~~ SOLN: 0.0000 [IU] | Freq: Every day | SUBCUTANEOUS | Status: DC

## 2018-02-06 MED ORDER — DM-GUAIFENESIN ER 30-600 MG PO TB12
1.0000 | ORAL_TABLET | Freq: Two times a day (BID) | ORAL | Status: AC
  Administered 2018-02-06 – 2018-02-08 (×6): 1 via ORAL
  Filled 2018-02-06 (×6): qty 1

## 2018-02-06 MED ORDER — INSULIN ASPART 100 UNIT/ML ~~LOC~~ SOLN
0.0000 [IU] | Freq: Three times a day (TID) | SUBCUTANEOUS | Status: DC
  Administered 2018-02-07 – 2018-02-08 (×3): 1 [IU] via SUBCUTANEOUS
  Administered 2018-02-08 – 2018-02-09 (×2): 2 [IU] via SUBCUTANEOUS
  Administered 2018-02-09: 1 [IU] via SUBCUTANEOUS
  Administered 2018-02-10: 2 [IU] via SUBCUTANEOUS
  Administered 2018-02-10: 1 [IU] via SUBCUTANEOUS
  Administered 2018-02-11: 2 [IU] via SUBCUTANEOUS

## 2018-02-06 MED ORDER — POTASSIUM CHLORIDE CRYS ER 20 MEQ PO TBCR
40.0000 meq | EXTENDED_RELEASE_TABLET | Freq: Once | ORAL | Status: AC
  Administered 2018-02-06: 40 meq via ORAL
  Filled 2018-02-06: qty 2

## 2018-02-06 NOTE — Progress Notes (Signed)
ANTICOAGULATION CONSULT NOTE - Follow Up Consult  Pharmacy Consult for heparin Indication: Afib and h/o VTE  Labs: Recent Labs    02/04/18 2223 02/05/18 0330 02/05/18 1311 02/05/18 2338  HGB 13.8 12.9*  --  13.4  HCT 41.3 41.0  --  39.4  PLT 250 257  --  238  LABPROT 16.8*  --   --  17.8*  INR 1.37  --   --  1.49  HEPARINUNFRC  --   --  0.34 0.48  CREATININE 1.52* 1.39* 1.34* 1.45*    Assessment/Plan:  84yo male remains therapeutic on heparin. Will continue gtt at current rate and monitor daily labs.   Wynona Neat, PharmD, BCPS  02/06/2018,12:25 AM

## 2018-02-06 NOTE — Progress Notes (Addendum)
Physical Therapy Treatment Patient Details Name: Justin Garner MRN: [id number] DOB: [birth year] Today's Date: 02/06/2018    History of Present Illness      PT Comments    Pt was in chair with RN present upon arrival. Pt was willing to participate in therapy. He was negative for orthostatics this session. He participated in LE strengthening and gait training this session. Pt required Mod A for safety + chair follow with gait and required assist navigating the RW in hallway. Pt would benefit from continued PT in order to progress toward stated goals and maximize functional independence. Pt remains appropriate for SNF as he continues to requireds Mod A for functional mobility.   Follow Up Recommendations  Supervision/Assistance - 24 hour;Other (comment)(HHPT vs SNF )     Equipment Recommendations  Rolling walker with 5" wheels    Recommendations for Other Services OT consult     Precautions / Restrictions      Mobility  Bed Mobility            Transfers                    Ambulation/Gait                 Stairs             Wheelchair Mobility    Modified Rankin (Stroke Patients Only)       Balance Overall balance assessment: Needs assistance Sitting-balance support: Bilateral upper extremity supported;Feet supported Sitting balance-Leahy Scale: Poor Sitting balance - Comments: posterior lean and requires min assist to maintain balance   Standing balance support: Bilateral upper extremity supported;During functional activity Standing balance-Leahy Scale: Poor Standing balance comment: Reliant on BUE support and external assist                            Cognition                                              Exercises Total Joint Exercises Ankle Circles/Pumps: AROM;10 reps;Left;Right;Supine Heel Slides: AROM;Left;Right;10 reps;Supine Hip ABduction/ADduction: AROM;Right;Left;10 reps;Supine     General Comments General comments (skin integrity, edema, etc.): Tested orthostatics. In sitting BP 106/73 mmHg and upon standing 102/44mmHg. Pt on 2L of O2 throughout session.      Pertinent Vitals/Pain      Home Living                      Prior Function            PT Goals (current goals can now be found in the care plan section) Acute Rehab PT Goals Patient Stated Goal: per grandaughter: to get him home PT Goal Formulation: With patient Time For Goal Achievement: 02/19/18 Potential to Achieve Goals: Good Progress towards PT goals: Progressing toward goals    Frequency    Min 3X/week      PT Plan      Co-evaluation              AM-PAC PT "6 Clicks" Daily Activity  Outcome Measure  Difficulty turning over in bed (including adjusting bedclothes, sheets and blankets)?: A Little Difficulty moving from lying on back to sitting on the side of the bed? : Unable Difficulty sitting down on and standing up from a chair with  arms (e.g., wheelchair, bedside commode, etc,.)?: Unable Help needed moving to and from a bed to chair (including a wheelchair)?: A Little Help needed walking in hospital room?: A Little Help needed climbing 3-5 steps with a railing? : A Lot 6 Click Score: 13    End of Session Equipment Utilized During Treatment: Gait belt Activity Tolerance: Patient limited by fatigue Patient left: in chair;with call bell/phone within reach;with chair alarm set Nurse Communication: Mobility status PT Visit Diagnosis: Unsteadiness on feet (R26.81);Muscle weakness (generalized) (M62.81)     Time: 8638-1771 PT Time Calculation (min) (ACUTE ONLY): 34 min  Charges:  $Gait Training: 8-22 mins $Therapeutic Activity: 8-22 mins                     [address] SPTA   [place] 02/06/2018, 4:06 PM

## 2018-02-06 NOTE — Progress Notes (Signed)
Progress Note    Justin Garner  GYB:[id number] DOB: 29-Nov-1932  DOA: 02/04/2018 PCP: [name], MD    Brief Narrative:     Medical records reviewed and are as summarized below:  Justin Garner is an 84 y.o. male with medical history significant of hypertension, hyperlipidemia, diabetes mellitus, COPD, GERD, hypothyroidism, depression, remote lung cancer (s/p of lobectomy and chemotherapy), CHF with EF 35%, CKD-2, CAD, atrial fibrillation and PE on Coumadin, who presents with generalized weakness, confusion, urinary incontinence and cough.  Found to have rhinovirus/enterovirus on NP swab.  Assessment/Plan:   Principal Problem:   Acute metabolic encephalopathy Active Problems:   Chronic atrial fibrillation   Pulmonary embolism (HCC)   Essential hypertension, benign   Hypokalemia   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 (HCC)   Chronic systolic CHF (congestive heart failure) (HCC)   Hypothyroidism   HLD (hyperlipidemia)   Chronic obstructive pulmonary disease (HCC)   Leukocytosis   Acute on chronic kidney failure-II   Generalized weakness   Hypoglycemia   Fever due to rhinovirus/enterovirus -NP swab + -was given IV abx-- no PNA on xray so may be able to d/c (elevated WBC count) Flutter valve/nebs  Acute respiratory failure with hypoxia  -need O2 documented on room air -on 2L O2 sat is 90% -home O2 study  Acute metabolic encephalopathy: -Likely multifactorial etiology  -? Baseline-- no family available but per Dr. Reesa Chew: Grandaughter is at the bedside and tells me patient has been getting more forgetful lately.  He lives at home by himself, overall his cognition has declined since his wife passed away about a year ago  Pulmonary embolism: on Coumadin, but INR is subtherapeutic, 1.37. -Continue Coumadin per pharm - IV heparin as bridge (not sure what year patient had PE and if coumadin is for this vs a fib ONLY)  Atrial Fibrillation: CHA2DS2-VASc Score is 6,  needs oral anticoagulation. Patient is on Coumadin at home. INR is subtherapeutic 1.37 on admission.  -Continue metoprolol, Cardizem - Coumadin per pharmacy-- bridge with heparin started in ER (not sure why)   HTN:  -Continue metoprolol, Cardizem -Hold lisinopril and Lasix due to worsening renal function  Hypokalemia: -continue to replace along with magnesium -recheck in AM -monitor on tele-- many PVCs  DM type 2 with CKD stage 2 ([place]) and hypoglycemia: - Last A1c 8.9 on 08/18/16, poorly controled. -when he came in he was hypoglycemia-- hold oral agent -SSI  Hypoglycemia: -As above  Chronic systolic CHF (congestive heart failure) ([place]):  -2D echo on 08/20/2016 showed EF 35-40%.   -holding lasix for now-- may need resumption in AM if PO intake increases  Hypothyroidism: TSH mildly elevated-- will not change dose yet -Continue home Synthroid  HLD (hyperlipidemia): -lipitor  Acute on chronic kidney failure-stage II:  -improved with IVF to baseline  Acute urinary retention -catheter placed on 11/20 -blood clots found prior   Family Communication/Anticipated D/C date and plan/Code Status   DVT prophylaxis: heparin/coumadin Code Status: Full Code.  Family Communication:  Disposition Plan: pending work Optician, dispensing:    None.    Subjective:   Trying to eat breakfast but having trouble due to missing teeth +cough  Objective:    Vitals:   02/06/18 0003 02/06/18 0402 02/06/18 0714 02/06/18 0842  BP: 112/76 122/73 112/71   Pulse: 88 81 94   Resp: 18 18 18    Temp: 99.2 F (37.3 C) 99 F (37.2 C) 98.6 F (37 C)  TempSrc: Oral Oral Oral   SpO2: 98% 98% 98% 92%  Weight:      Height:        Intake/Output Summary (Last 24 hours) at 02/06/2018 0951 Last data filed at 02/06/2018 0600 Gross per 24 hour  Intake 1175.31 ml  Output 1600 ml  Net -424.69 ml   Filed Weights   02/05/18 0505  Weight: 99.8 kg    Exam: In bed,  2LNC on No increased work of breathing, coarse breath sounds with cough irr +BS, soft, NT No rashes   Data Reviewed:   I have personally reviewed following labs and imaging studies:  Labs: Labs show the following:   Basic Metabolic Panel: Recent Labs  Lab 02/04/18 2223 02/05/18 0330 02/05/18 1311 02/05/18 2338 02/06/18 0810  NA 139 140 138 140 140  K 2.8* 2.8* 3.6 3.0* 3.1*  CL 103 103 105 104 106  CO2 24 27 23 22 24   GLUCOSE 66* 71 118* 101* 87  BUN 30* 26* 27* 24* 21  CREATININE 1.52* 1.39* 1.34* 1.45* 1.13  CALCIUM 8.1* 7.9* 8.2* 7.9* 8.0*  MG  --  1.2*  --  1.1*  --    GFR Estimated Creatinine Clearance: 62.5 mL/min (by C-G formula based on SCr of 1.13 mg/dL). Liver Function Tests: No results for input(s): AST, ALT, ALKPHOS, BILITOT, PROT, ALBUMIN in the last 168 hours. No results for input(s): LIPASE, AMYLASE in the last 168 hours. No results for input(s): AMMONIA in the last 168 hours. Coagulation profile Recent Labs  Lab 02/04/18 2223 02/05/18 2338 02/06/18 0810  INR 1.37 1.49 1.58    CBC: Recent Labs  Lab 02/04/18 2223 02/05/18 0330 02/05/18 2338 02/06/18 0810  WBC 14.0* 13.6* 15.8* 17.4*  NEUTROABS 10.5*  --   --   --   HGB 13.8 12.9* 13.4 13.3  HCT 41.3 41.0 39.4 40.9  MCV 93.9 95.8 93.4 94.7  PLT 250 257 238 247   Cardiac Enzymes: No results for input(s): CKTOTAL, CKMB, CKMBINDEX, TROPONINI in the last 168 hours. BNP (last 3 results) No results for input(s): PROBNP in the last 8760 hours. CBG: Recent Labs  Lab 02/05/18 0813 02/05/18 0941 02/05/18 1714 02/05/18 2117 02/06/18 0609  GLUCAP 84 127* 171* 98 82   D-Dimer: No results for input(s): DDIMER in the last 72 hours. Hgb A1c: No results for input(s): HGBA1C in the last 72 hours. Lipid Profile: No results for input(s): CHOL, HDL, LDLCALC, TRIG, CHOLHDL, LDLDIRECT in the last 72 hours. Thyroid function studies: Recent Labs    02/05/18 0330  TSH 4.513*   Anemia work  up: No results for input(s): VITAMINB12, FOLATE, FERRITIN, TIBC, IRON, RETICCTPCT in the last 72 hours. Sepsis Labs: Recent Labs  Lab 02/04/18 2223 02/05/18 0330 02/05/18 2338 02/06/18 0810  WBC 14.0* 13.6* 15.8* 17.4*    Microbiology Recent Results (from the past 240 hour(s))  Respiratory Panel by PCR     Status: Abnormal   Collection Time: 02/05/18  5:04 PM  Result Value Ref Range Status   Adenovirus NOT DETECTED NOT DETECTED Final   Coronavirus 229E NOT DETECTED NOT DETECTED Final   Coronavirus HKU1 NOT DETECTED NOT DETECTED Final   Coronavirus NL63 NOT DETECTED NOT DETECTED Final   Coronavirus OC43 NOT DETECTED NOT DETECTED Final   Metapneumovirus NOT DETECTED NOT DETECTED Final   Rhinovirus / Enterovirus DETECTED (A) NOT DETECTED Final   Influenza A NOT DETECTED NOT DETECTED Final   Influenza B NOT DETECTED NOT DETECTED Final  Parainfluenza Virus 1 NOT DETECTED NOT DETECTED Final   Parainfluenza Virus 2 NOT DETECTED NOT DETECTED Final   Parainfluenza Virus 3 NOT DETECTED NOT DETECTED Final   Parainfluenza Virus 4 NOT DETECTED NOT DETECTED Final   Respiratory Syncytial Virus NOT DETECTED NOT DETECTED Final   Bordetella pertussis NOT DETECTED NOT DETECTED Final   Chlamydophila pneumoniae NOT DETECTED NOT DETECTED Final   Mycoplasma pneumoniae NOT DETECTED NOT DETECTED Final    Comment: Performed at [facility], [place] [address] [place], [place] [address]    Procedures and diagnostic studies:  Dg Chest 2 View  Result Date: 02/04/2018 CLINICAL DATA:  Cough for a few days EXAM: CHEST - 2 VIEW COMPARISON:  May 16, 2017 FINDINGS: Blunting of the right costophrenic angle is stable. Mild interstitial prominence is unchanged. Stable cardiomegaly. No other acute abnormalities or changes. IMPRESSION: 1. Cardiomegaly. 2. Stable mildly prominent interstitial markings, could be due to the patient's known underlying emphysematous changes or mild chronic edema. Recommend  clinical correlation. 3. Chronic blunting of the right costophrenic angle consistent with a small effusion seen on previous CT imaging. 4. No other acute abnormalities. Electronically Signed   By: Dorise Bullion III M.D   On: 02/04/2018 23:06   Ct Head Wo Contrast  Result Date: 02/05/2018 CLINICAL DATA:  Increasing memory lapses. Urinary incontinence. History of lung cancer. EXAM: CT HEAD WITHOUT CONTRAST TECHNIQUE: Contiguous axial images were obtained from the base of the skull through the vertex without intravenous contrast. COMPARISON:  05/16/2017 FINDINGS: Brain: Diffuse cerebral atrophy. Ventricular dilatation consistent with central atrophy. Low-attenuation changes in the deep white matter consistent with small vessel ischemia. Prominent CSF space in the posterior fossa likely representing prominent cisterna magna or arachnoid cyst. No mass effect or midline shift. No abnormal extra-axial fluid collections. Gray-white matter junctions are distinct. Basal cisterns are not effaced. Vascular: Moderate intracranial arterial calcifications. Skull: Calvarium appears intact. Sinuses/Orbits: Mucosal thickening in the paranasal sinuses. No acute air-fluid levels. Mastoid air cells are clear. Other: None. IMPRESSION: No acute intracranial abnormalities. Chronic atrophy and small vessel ischemic changes. Electronically Signed   By: Lucienne Capers M.D.   On: 02/05/2018 04:35    Medications:   . atorvastatin  20 mg Oral q1800  . azithromycin  250 mg Oral Daily  . dextromethorphan-guaiFENesin  1 tablet Oral BID  . diltiazem  360 mg Oral Daily  . insulin aspart  0-5 Units Subcutaneous QHS  . insulin aspart  0-9 Units Subcutaneous TID WC  . ipratropium-albuterol  3 mL Nebulization QID  . levothyroxine  112 mcg Oral Q24H  . metoprolol tartrate  12.5 mg Oral BID  . mirtazapine  15 mg Oral QHS  . mometasone-formoterol  2 puff Inhalation BID  . omega-3 acid ethyl esters  1 g Oral Daily  . pantoprazole   40 mg Oral Daily  . potassium chloride  40 mEq Oral Once  . Warfarin - Pharmacist Dosing Inpatient   Does not apply q1800   Continuous Infusions: . cefTRIAXone (ROCEPHIN)  IV 2 g (02/05/18 1911)  . heparin 1,500 Units/hr (02/05/18 2244)  . magnesium sulfate 1 - 4 g bolus IVPB 2 g (02/06/18 0933)     LOS: 0 days   Geradine Girt  [facility]   *Please refer to [email or web address], password [email or web address] to get updated schedule on who will round on this patient, as hospitalists switch teams weekly. If 7PM-7AM, please contact night-coverage at [email or web address], password [email or web address] for any overnight needs.  02/06/2018,  9:51 AM

## 2018-02-06 NOTE — Progress Notes (Signed)
Occupational Therapy Treatment Patient Details Name: Justin Garner MRN: [id number] DOB: [birth year] Today's Date: 02/06/2018    History of present illness Pt is an 84 y/o male admitted secondary to generalized weakness and increased confusion. Thought to have acute metabolic encephalopathy secondary to multifactorial etiology. CT negative for acute abnormality. PMH includes a fib, PE, DM, HTN, CHF, and COPD.    OT comments  Pt progressing towards acute OT goals. Focus of session was bed mobility and simulated toilet transfer (EOB>recliner). Pt's HR in supine at start of session 103, low 120s EOB, around 100 at end of session sitting up in recliner. Supplemental O2 increased from 2 to 3L during stand-pivot transfer due to pt's elevated HR and O2 sat 87 EOB; decreased O2 back to 2L at end of session with pt sitting up in recliner. D/c plan remains appropriate.    Follow Up Recommendations  SNF;Supervision/Assistance - 24 hour    Equipment Recommendations  3 in 1 bedside commode    Recommendations for Other Services      Precautions / Restrictions Precautions Precautions: Fall Precaution Comments: watch vitals Restrictions Weight Bearing Restrictions: No       Mobility Bed Mobility Overal bed mobility: Needs Assistance Bed Mobility: Supine to Sit     Supine to sit: Mod assist     General bed mobility comments: Assist to powerup trunk, advance BLE and pivot hips utilizing bed pad. Cues for sequencing movements and to come to full EOB position  Transfers Overall transfer level: Needs assistance Equipment used: Rolling walker (2 wheeled) Transfers: Sit to/from [facility] Sit to Stand: Min assist Stand pivot transfers: Min assist;Mod assist       General transfer comment: Assist to steady, powerup, sequencine movements with rw (therapist advancing rw at times during pivot). O2 on 2L dropped to 86 after coming to sitting position so bumped up supplemental  to 3L during transfer.     Balance Overall balance assessment: Needs assistance Sitting-balance support: Bilateral upper extremity supported;Feet supported Sitting balance-Leahy Scale: Poor Sitting balance - Comments: posterior lean and requires min assist to maintain balance   Standing balance support: Bilateral upper extremity supported;During functional activity Standing balance-Leahy Scale: Poor Standing balance comment: Reliant on BUE support                            ADL either performed or assessed with clinical judgement   ADL Overall ADL's : Needs assistance/impaired                         Toilet Transfer: Moderate assistance;Stand-pivot Toilet Transfer Details (indicate cue type and reason): simulated with EOB>recliner. Elevated seat heights. Cues for powering up/control descent and to steady. Assist with sequencing movements with rw.           General ADL Comments: Pt completed bed mobility and simulated stand-pivot toilet transfer.      Vision       Perception     Praxis      Cognition Arousal/Alertness: Lethargic;Awake/alert Behavior During Therapy: Flat affect;WFL for tasks assessed/performed Overall Cognitive Status: Impaired/Different from baseline Area of Impairment: Orientation;Attention;Memory;Safety/judgement;Awareness;Problem solving;Following commands                 Orientation Level: Disoriented to;Time Current Attention Level: Selective Memory: Decreased short-term memory Following Commands: Follows one step commands with increased time;Follows one step commands consistently Safety/Judgement: Decreased awareness of safety;Decreased awareness of deficits  Awareness: Emergent Problem Solving: Slow processing;Decreased initiation;Requires verbal cues General Comments: history of impairment but grandaughter reports "He is not himself" with increased confusion (typically doesnt keep up with the month)          Exercises     Shoulder Instructions       General Comments Pt with pulse 103 at start of session, low 120s upon coming to EOB, around 100 at end of session. Pt bumped up to 3L O2 during transfer and placed back on 2L at end of session.    Pertinent Vitals/ Pain       Pain Assessment: No/denies pain  Home Living                                          Prior Functioning/Environment              Frequency  Min 2X/week        Progress Toward Goals  OT Goals(current goals can now be found in the care plan section)  Progress towards OT goals: Progressing toward goals  Acute Rehab OT Goals Patient Stated Goal: per grandaughter: to get him home OT Goal Formulation: With family Time For Goal Achievement: 02/19/18 Potential to Achieve Goals: Good ADL Goals Pt Will Perform Grooming: with modified independence;sitting Pt Will Perform Upper Body Bathing: with modified independence;sitting Pt Will Perform Lower Body Dressing: with modified independence;sit to/from stand Pt Will Transfer to Toilet: with modified independence;bedside commode;stand pivot transfer Pt/caregiver will Perform Home Exercise Program: Both right and left upper extremity;With theraband;With written HEP provided  Plan Discharge plan remains appropriate    Co-evaluation                 AM-PAC PT "6 Clicks" Daily Activity     Outcome Measure   Help from another person eating meals?: None Help from another person taking care of personal grooming?: A Little Help from another person toileting, which includes using toliet, bedpan, or urinal?: A Lot Help from another person bathing (including washing, rinsing, drying)?: A Lot Help from another person to put on and taking off regular upper body clothing?: A Lot Help from another person to put on and taking off regular lower body clothing?: A Lot 6 Click Score: 15    End of Session Equipment Utilized During Treatment: Oxygen  OT  Visit Diagnosis: Other abnormalities of gait and mobility (R26.89);Muscle weakness (generalized) (M62.81)   Activity Tolerance Patient limited by fatigue   Patient Left in chair;with call bell/phone within reach;with chair alarm set   Nurse Communication          Time: [phone] OT Time Calculation (min): 37 min  Charges: OT General Charges $OT Visit: 1 Visit OT Treatments $Self Care/Home Management : 23-37 mins  Tyrone Schimke, OT Acute Rehabilitation Services Pager: [phone] Office: [phone]    Hortencia Pilar 02/06/2018, 10:58 AM

## 2018-02-06 NOTE — Plan of Care (Signed)
  Problem: Education: Goal: Knowledge of General Education information will improve Description Including pain rating scale, medication(s)/side effects and non-pharmacologic comfort measures Outcome: Progressing   Problem: Clinical Measurements: Goal: Ability to maintain clinical measurements within normal limits will improve Outcome: Progressing Goal: Will remain free from infection Outcome: Progressing Goal: Diagnostic test results will improve Outcome: Progressing   Problem: Activity: Goal: Risk for activity intolerance will decrease Outcome: Progressing   Problem: Coping: Goal: Level of anxiety will decrease Outcome: Progressing   Problem: Safety: Goal: Ability to remain free from injury will improve Outcome: Progressing   Problem: Skin Integrity: Goal: Risk for impaired skin integrity will decrease Outcome: Progressing

## 2018-02-06 NOTE — Progress Notes (Addendum)
ANTICOAGULATION CONSULT NOTE - Follow Up Consult  Pharmacy Consult for heparin Indication: atrial fibrillation and h/o pulmonary embolus  No Known Allergies  Patient Measurements: Height: 6\' 2"  (188 cm) Weight: 220 lb (99.8 kg) IBW/kg (Calculated) : 82.2 Heparin Dosing Weight: 99.8kg  Vital Signs: Temp: 98.6 F (37 C) (11/20 0714) Temp Source: Oral (11/20 0714) BP: 112/71 (11/20 0714) Pulse Rate: 94 (11/20 0714)  Labs: Recent Labs    02/04/18 2223 02/05/18 0330 02/05/18 1311 02/05/18 2338 02/06/18 0653 02/06/18 0810  HGB 13.8 12.9*  --  13.4  --  13.3  HCT 41.3 41.0  --  39.4  --  40.9  PLT 250 257  --  238  --  247  LABPROT 16.8*  --   --  17.8*  --  18.7*  INR 1.37  --   --  1.49  --  1.58  HEPARINUNFRC  --   --  0.34 0.48 0.43  --   CREATININE 1.52* 1.39* 1.34* 1.45*  --  1.13    Estimated Creatinine Clearance: 62.5 mL/min (by C-G formula based on SCr of 1.13 mg/dL).  Assessment: Pt with h/o PAF and h/o PE on PTA warfarin 5mg  daily except for 7.5mg  on Tues/Thurs. INR was subtherapeutic on admission at 1.37 w/ last dose taken 11/18. Pharmacy was consulted to start IV heparin bridge until INR is therapeutic. Warfarin 10mg  given 11/19 PM. Nurse noted dark colored urine & blood clots in condom cath, on pt's thigh, & in bed as well as pt coughing up bloody sputum. MD is aware. Hgb 13.3 (stable), PLTs WNL.  - HL 0.43<0.48,  Therapeutic on heparin 1500 units/hr - INR 1.58<1.49   Goal of Therapy:  INR 2-3 Heparin level 0.3-0.7 units/ml Monitor platelets by anticoagulation protocol: Yes  Plan:  Continue heparin gtt 1500 units/hr Warfarin 5mg  x1 Daily HL, INR, CBC Monitor for s/s bleeding   Ladoris Gene 02/06/2018,11:48 AM    Potential DDI with Azithromycin, can increase hypoprothrombinemic effect of warfarin. I discussed / reviewed the pharmacy note by Ladoris Gene, Pharm D Student and I agree with the resident's findings and plans as documented.  Nicole Cella,  RPh Clinical Pharmacist Please check AMION for all [place] phone numbers After 10:00 PM, call [place] [phone]

## 2018-02-06 NOTE — Progress Notes (Signed)
Patient O2 sat is 94% on room air at rest.

## 2018-02-07 ENCOUNTER — Observation Stay (HOSPITAL_COMMUNITY): Payer: Medicare Other

## 2018-02-07 DIAGNOSIS — R531 Weakness: Secondary | ICD-10-CM | POA: Diagnosis not present

## 2018-02-07 DIAGNOSIS — B348 Other viral infections of unspecified site: Secondary | ICD-10-CM

## 2018-02-07 DIAGNOSIS — N179 Acute kidney failure, unspecified: Secondary | ICD-10-CM | POA: Diagnosis not present

## 2018-02-07 DIAGNOSIS — G9341 Metabolic encephalopathy: Secondary | ICD-10-CM | POA: Diagnosis not present

## 2018-02-07 DIAGNOSIS — E162 Hypoglycemia, unspecified: Secondary | ICD-10-CM | POA: Diagnosis not present

## 2018-02-07 LAB — GLUCOSE, CAPILLARY
Glucose-Capillary: 109 mg/dL — ABNORMAL HIGH (ref 70–99)
Glucose-Capillary: 131 mg/dL — ABNORMAL HIGH (ref 70–99)
Glucose-Capillary: 82 mg/dL (ref 70–99)
Glucose-Capillary: 117 mg/dL — ABNORMAL HIGH (ref 70–99)

## 2018-02-07 LAB — BASIC METABOLIC PANEL WITH GFR
Anion gap: 9 (ref 5–15)
BUN: 27 mg/dL — ABNORMAL HIGH (ref 8–23)
CO2: 23 mmol/L (ref 22–32)
Calcium: 8.6 mg/dL — ABNORMAL LOW (ref 8.9–10.3)
Chloride: 106 mmol/L (ref 98–111)
Creatinine, Ser: 1.39 mg/dL — ABNORMAL HIGH (ref 0.61–1.24)
Glucose, Bld: 107 mg/dL — ABNORMAL HIGH (ref 70–99)
Potassium: 3.5 mmol/L (ref 3.5–5.1)
Sodium: 138 mmol/L (ref 135–145)

## 2018-02-07 LAB — HEMOGLOBIN A1C
Hgb A1c MFr Bld: 6 % — ABNORMAL HIGH (ref 4.8–5.6)
Mean Plasma Glucose: 125.5 mg/dL

## 2018-02-07 LAB — CBC
HCT: 40.9 % (ref 39.0–52.0)
Hemoglobin: 13 g/dL (ref 13.0–17.0)
MCH: 30.4 pg (ref 26.0–34.0)
MCHC: 31.8 g/dL (ref 30.0–36.0)
MCV: 95.8 fL (ref 80.0–100.0)
Platelets: 236 10*3/uL (ref 150–400)
RBC: 4.27 MIL/uL (ref 4.22–5.81)
RDW: 15.1 % (ref 11.5–15.5)
WBC: 15.6 10*3/uL — ABNORMAL HIGH (ref 4.0–10.5)
nRBC: 0 % (ref 0.0–0.2)

## 2018-02-07 LAB — PROTIME-INR
INR: 1.6
Prothrombin Time: 18.9 s — ABNORMAL HIGH (ref 11.4–15.2)

## 2018-02-07 LAB — HEPARIN LEVEL (UNFRACTIONATED)
Heparin Unfractionated: 0.24 [IU]/mL — ABNORMAL LOW (ref 0.30–0.70)
Heparin Unfractionated: 0.23 IU/mL — ABNORMAL LOW (ref 0.30–0.70)

## 2018-02-07 LAB — MAGNESIUM: MAGNESIUM: 1.7 mg/dL (ref 1.7–2.4)

## 2018-02-07 MED ORDER — FUROSEMIDE 20 MG PO TABS
20.0000 mg | ORAL_TABLET | Freq: Every day | ORAL | Status: DC
  Administered 2018-02-07 – 2018-02-08 (×2): 20 mg via ORAL
  Filled 2018-02-07 (×2): qty 1

## 2018-02-07 MED ORDER — IPRATROPIUM-ALBUTEROL 0.5-2.5 (3) MG/3ML IN SOLN
3.0000 mL | Freq: Three times a day (TID) | RESPIRATORY_TRACT | Status: DC
  Administered 2018-02-07 – 2018-02-11 (×11): 3 mL via RESPIRATORY_TRACT
  Filled 2018-02-07 (×12): qty 3

## 2018-02-07 MED ORDER — POTASSIUM CHLORIDE CRYS ER 20 MEQ PO TBCR
40.0000 meq | EXTENDED_RELEASE_TABLET | Freq: Once | ORAL | Status: AC
  Administered 2018-02-07: 40 meq via ORAL
  Filled 2018-02-07: qty 2

## 2018-02-07 MED ORDER — FUROSEMIDE 10 MG/ML IJ SOLN
20.0000 mg | Freq: Once | INTRAMUSCULAR | Status: AC
  Administered 2018-02-07: 20 mg via INTRAVENOUS
  Filled 2018-02-07: qty 4

## 2018-02-07 MED ORDER — MAGNESIUM SULFATE 2 GM/50ML IV SOLN
2.0000 g | Freq: Once | INTRAVENOUS | Status: AC
  Administered 2018-02-07: 2 g via INTRAVENOUS
  Filled 2018-02-07: qty 50

## 2018-02-07 MED ORDER — WARFARIN SODIUM 3 MG PO TABS
3.0000 mg | ORAL_TABLET | Freq: Once | ORAL | Status: DC
  Filled 2018-02-07: qty 1

## 2018-02-07 MED ORDER — WARFARIN SODIUM 4 MG PO TABS
4.0000 mg | ORAL_TABLET | Freq: Once | ORAL | Status: DC
  Filled 2018-02-07: qty 1

## 2018-02-07 NOTE — Clinical Social Work Note (Signed)
Clinical Social Work Assessment  Patient Details  Name: Justin Garner MRN: [id number] Date of Birth: [birth year]  Date of referral:  02/07/18               Reason for consult:  Facility Placement                Permission sought to share information with:  Facility Sport and exercise psychologist, Family Supports Permission granted to share information::  Yes, Verbal Permission Granted  Name::     Insurance underwriter::  SNF  Relationship::  Daughter  Contact Information:     Housing/Transportation Living arrangements for the past 2 months:  Single Family Home Source of Information:  Adult Children Patient Interpreter Needed:  None Criminal Activity/Legal Involvement Pertinent to Current Situation/Hospitalization:  No - Comment as needed Significant Relationships:  Adult Children Lives with:  Self Do you feel safe going back to the place where you live?  Yes Need for family participation in patient care:  Yes (Comment)  Care giving concerns: Patient is currently living alone and there was some concerns about Mr. Stehlik living alone. Mr. Crite will need SNF placement. CSW spoke with the daughter and she stated that her father has been to the [facility] in the past. CSW will reach out to the [facility] to see if they have any availability.    Social Worker assessment / plan:  CSW completed FL2 Note and sent it to available SNF facilities in the hub. CSW spoke with the patient's daughter and she would like her father to attend the [facility]. She is familiar with rehab because her father has been to a rehab facility in the past. CSW completed PASSAR review and will need to get a 30 day notice from Dr. Eliseo Squires.   Employment status:  Retired Nurse, adult PT Recommendations:  [place] / Referral to community resources:  [place]  Patient/Family's Response to care:  Family is receptive to the patient going to a SNF  facility. They would like to send the patient to the [facility] in [place].   Patient/Family's Understanding of and Emotional Response to Diagnosis, Current Treatment, and Prognosis:  Family does not have any current concerns. They are receptive to the patient going to a rehab facility.   Emotional Assessment Appearance:  Appears stated age Attitude/Demeanor/Rapport:  Unable to Assess Affect (typically observed):  Unable to Assess Orientation:  Oriented to Self, Oriented to Place, Oriented to Situation Alcohol / Substance use:  Not Applicable Psych involvement (Current and /or in the community):  No (Comment)  Discharge Needs  Concerns to be addressed:  Discharge Planning Concerns, Care Coordination Readmission within the last 30 days:  No Current discharge risk:  Dependent with Mobility, Cognitively Impaired, Lives alone Barriers to Discharge:  Awaiting Regulatory affairs officer Tour manager), Ship broker, Continued Medical Work up   [employer], [place] 02/07/2018, 10:41 AM

## 2018-02-07 NOTE — Progress Notes (Addendum)
Tx performed and charted by SPTA under direct guidance and supervision of PTA at all times. This session was performed under the supervision of a licensed clinician.   Charting reviewed for accuracy and depicts tx performed and services provided.   Will inform supervising PT of need for change in recommendations based on lack of progress and decline from patient.    Governor Rooks, PTA Acute Rehabilitation Services Pager [phone] Office [phone]    Physical Therapy Treatment Patient Details Name: Justin Garner MRN: [id number] DOB: 1932/09/20 Today's Date: 02/07/2018    History of Present Illness Pt is an 84 y/o male admitted secondary to generalized weakness and increased confusion. Thought to have acute metabolic encephalopathy secondary to multifactorial etiology. CT negative for acute abnormality. PMH includes a fib, PE, DM, HTN, CHF, and COPD.     PT Comments    Pt was in bed with no visitors present upon arrival. O2 was 84% on RA and did not raise above 90% until 4L (O2 ranging from 92-95% on 4L)- RN notified. Pt also had visual hallucinations throughout session, thinking therapist was his granddaughter upon entering the room and seeing animals in the room- RN notified. He was willing to participate in therapy. Pt participated in supine exercises and transfers from bed to chair. Pt required Mod Assist with all bed mobility and Mod A +2 for transfers for safety this session. Pt was limited today due to fatigue and SOB. Pt would benefit from continued PT in order to progress toward stated goals and maximize function. Recommend d/c to SNF based on current level of function. Will inform supervising PT on updated follow up recommendations.    Follow Up Recommendations  Supervision/Assistance - 24 hour;SNF     Equipment Recommendations  Rolling walker with 5" wheels    Recommendations for Other Services OT consult     Precautions / Restrictions Precautions Precautions:  Fall Precaution Comments: watch vitals Restrictions Weight Bearing Restrictions: No    Mobility  Bed Mobility Overal bed mobility: Needs Assistance Bed Mobility: Supine to Sit     Supine to sit: Mod assist     General bed mobility comments: Pt required VC to progess LE off EOB. Assist needed to elevate trunk and square up.  Transfers Overall transfer level: Needs assistance Equipment used: Rolling walker (2 wheeled) Transfers: Sit to/from [facility] Sit to Stand: Mod assist Stand pivot transfers: Mod assist       General transfer comment: Assist to steady, powerup into standing. Pt required assist to keep hands on RW. Required VC for foot placement and handling AD. Multimodal cueing for safe hand placment.  Ambulation/Gait Ambulation/Gait assistance: Mod assist;+2 physical assistance(+2 for safety) Gait Distance (Feet): (limited to 3-4 steps) Assistive device: Rolling walker (2 wheeled) Gait Pattern/deviations: Step-to pattern;Decreased step length - right;Decreased step length - left;Decreased stride length;Trunk flexed;Narrow base of support     General Gait Details: Pt required assist to advance RW and cues for foot placement.   Stairs             Wheelchair Mobility    Modified Rankin (Stroke Patients Only)       Balance Overall balance assessment: Needs assistance Sitting-balance support: Bilateral upper extremity supported;Feet supported Sitting balance-Leahy Scale: Poor Sitting balance - Comments: posterior lean and requires min assist to maintain balance   Standing balance support: Bilateral upper extremity supported;During functional activity Standing balance-Leahy Scale: Poor Standing balance comment: Reliant on BUE support and external assist  Cognition Arousal/Alertness: Lethargic;Awake/alert Behavior During Therapy: Flat affect;WFL for tasks assessed/performed Overall Cognitive Status:  Impaired/Different from baseline Area of Impairment: Orientation;Attention;Memory;Safety/judgement;Awareness;Problem solving;Following commands                 Orientation Level: Disoriented to;Time;Situation Current Attention Level: Selective Memory: Decreased short-term memory Following Commands: Follows one step commands with increased time;Follows one step commands consistently Safety/Judgement: Decreased awareness of safety;Decreased awareness of deficits Awareness: Emergent Problem Solving: Slow processing;Decreased initiation;Requires verbal cues General Comments: no family member present during session. Pt had hallucinations during session, asking if a goat was in the room- RN notified and confirmed that pt is "pleasantly confused".      Exercises Total Joint Exercises Ankle Circles/Pumps: AROM;10 reps;Left;Right;Supine Short Arc Quad: AROM;Right;Left;10 reps;Supine Heel Slides: AROM;Left;Right;10 reps;Supine    General Comments General comments (skin integrity, edema, etc.): Pt on RA upon arrival with O2 84%, remaining at 84% on 2L, 88% on 3L and ranging 92-95% on 4L- RN notified.       Pertinent Vitals/Pain Pain Assessment: No/denies pain    Home Living                      Prior Function            PT Goals (current goals can now be found in the care plan section) Acute Rehab PT Goals Patient Stated Goal: per grandaughter: to get him home PT Goal Formulation: With patient Time For Goal Achievement: 02/19/18 Potential to Achieve Goals: Good Progress towards PT goals: Progressing toward goals    Frequency    Min 3X/week      PT Plan Discharge plan needs to be updated    Co-evaluation              AM-PAC PT "6 Clicks" Daily Activity  Outcome Measure  Difficulty turning over in bed (including adjusting bedclothes, sheets and blankets)?: A Little Difficulty moving from lying on back to sitting on the side of the bed? : A  Lot Difficulty sitting down on and standing up from a chair with arms (e.g., wheelchair, bedside commode, etc,.)?: A Lot Help needed moving to and from a bed to chair (including a wheelchair)?: A Lot Help needed walking in hospital room?: A Lot Help needed climbing 3-5 steps with a railing? : A Lot 6 Click Score: 13    End of Session Equipment Utilized During Treatment: Gait belt Activity Tolerance: Patient limited by fatigue Patient left: in chair;with call bell/phone within reach;with chair alarm set Nurse Communication: Mobility status;Other (comment)(pt requiring 4L of O2) PT Visit Diagnosis: Unsteadiness on feet (R26.81);Muscle weakness (generalized) (M62.81)     Time: 4403-4742 PT Time Calculation (min) (ACUTE ONLY): 28 min  Charges:  $Therapeutic Activity: 23-37 mins                     [address] SPTA   [place] 02/07/2018, 3:45 PM

## 2018-02-07 NOTE — Progress Notes (Signed)
ANTICOAGULATION CONSULT NOTE - Follow Up Consult  Pharmacy Consult for warfarin  Indication: atrial fibrillation and h/o pulmonary embolus  No Known Allergies  Patient Measurements: Height: 6\' 2"  (188 cm) Weight: 220 lb (99.8 kg) IBW/kg (Calculated) : 82.2 Heparin Dosing Weight: 99.8kg  Vital Signs: Temp: 98 F (36.7 C) (11/21 1155) Temp Source: Oral (11/21 1155) BP: 130/84 (11/21 1155) Pulse Rate: 91 (11/21 1155)  Labs: Recent Labs    02/05/18 1311 02/05/18 2338 02/06/18 0653 02/06/18 0810 02/07/18 0347 02/07/18 1142  HGB  --  13.4  --  13.3 13.0  --   HCT  --  39.4  --  40.9 40.9  --   PLT  --  238  --  247 236  --   LABPROT  --  17.8*  --  18.7* 18.9*  --   INR  --  1.49  --  1.58 1.60  --   HEPARINUNFRC 0.34 0.48 0.43  --  0.24* 0.23*  CREATININE 1.34* 1.45*  --  1.13  --   --     Estimated Creatinine Clearance: 62.5 mL/min (by C-G formula based on SCr of 1.13 mg/dL).  Assessment: Pt with h/o PAF and h/o PE on PTA warfarin 5mg  daily except for 7.5mg  on Tues/Thurs. INR was subtherapeutic on admission at 1.37 w/ last dose taken 11/18. Pharmacy was consulted to start IV heparin bridge until INR is therapeutic. Warfarin 10mg  given 11/19 PM. Nurse noted dark colored urine & blood clots in condom cath, on pt's thigh, & in bed as well as pt coughing up bloody sputum. MD is aware. Hgb 13.0 (stable), PLTs WNL.   6 hour heparin level still low at 0.23 despite increase in heparin rate to 1700 units/hr.  RN reports IV line appears appropriate, infusing w/o complications. INR =1.60  Dr. Eliseo Squires has now discontinued the IV heparin.   Patient has coughed up blood tinged sputum this AM and now foley is blood tinged.  MD wants to continue warfarin.  Potential DDI with Azithromycin, can increase hypoprothrombinemic effect of warfarin. May not have seen effect of 10mg  warfarin dose given 11/19 , yet.  Therefore I will lower dose to 3 mg po today.  Goal of Therapy:  INR 2-3 Monitor  platelets by anticoagulation protocol: Yes  Plan:  MD discontinued IV heparin Warfarin 3 mg x1  Daily HL, INR, CBC Monitor for s/s bleeding   Nicole Cella, RPh Clinical Pharmacist [phone] Please check AMION for all [place] phone numbers After 10:00 PM, call [place] [phone]  02/07/2018,1:19 PM

## 2018-02-07 NOTE — Progress Notes (Signed)
Pt is pleasantly confused today with mild hallucination, Breathing is better, Tele monitor  discontinued per MD order, Nurse will continue to Assess patient.

## 2018-02-07 NOTE — Progress Notes (Signed)
RT NOTE: Patient found on room air with sats of 90%. Patient has sat goal of greater than 92% and has been on 2L [place]. Patient placed back on 2L [place] when RT treatments were completed. Vitals are stable. RT will continue to monitor.

## 2018-02-07 NOTE — NC FL2 (Signed)
[place] LEVEL OF CARE SCREENING TOOL     IDENTIFICATION  Patient Name: Justin Garner Birthdate: [birth year] Sex: male Admission Date (Current Location): 02/04/2018  [facility] and [place] Number:  Herbalist and Address:  The [place], [place] [address] [place], [place] [address]      Provider Number: [id number]  Attending Physician Name and Address:  Geradine Girt, DO  Relative Name and Phone Number:  Gunnar Fusi, Daughter, [phone] and Jodelle Gross, [phone]    Current Level of Care: Hospital Recommended Level of Care: [place] Prior Approval Number:    Date Approved/Denied:   PASRR Number: Under Manual Review   Discharge Plan: SNF    Current Diagnoses: Patient Active Problem List   Diagnosis Date Noted  . Generalized weakness 02/05/2018  . Hypoglycemia 02/05/2018  . Hemoptysis 04/26/2017  . Lobar pneumonia ([place]) 12/20/2016  . Atrial fibrillation with RVR ([place]) 06/09/2016  . Acute metabolic encephalopathy 29/51/8841  . COPD exacerbation ([place]) 11/15/2015  . Acute on chronic kidney failure-II 11/15/2015  . Malnutrition of moderate degree 11/15/2015  . Leukocytosis 07/12/2015  . Chronic obstructive pulmonary disease ([place])   . Cardiomyopathy ([place])   . CAD in native artery   . Hypomagnesemia   . CAP (community acquired pneumonia) 06/13/2015  . Acute on chronic respiratory failure with hypoxia ([place]) 06/13/2015  . Hypothyroidism 06/13/2015  . HLD (hyperlipidemia) 06/13/2015  . Chronic systolic CHF (congestive heart failure) ([place])   . Protein-calorie malnutrition, severe ([place]) 12/29/2013  . Hypokalemia 12/28/2013  . Sepsis ([place]) 12/28/2013  . 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 Acute systolic heart failure ([place]) 08/12/2013  . Primary cancer of right upper lobe of lung ([place]) 06/30/2013  . Coronary atherosclerosis of native coronary artery 05/28/2013  . Essential  hypertension, benign 05/28/2013  . Chronic atrial fibrillation 12/24/2012  . Pulmonary embolism ([place]) 12/24/2012  . COPD with acute bronchitis ([place]) 04/23/2008    Orientation RESPIRATION BLADDER Height & Weight     Self, Situation, Place  Normal Incontinent Weight: 220 lb (99.8 kg) Height:  6\' 2"  (188 cm)  BEHAVIORAL SYMPTOMS/MOOD NEUROLOGICAL BOWEL NUTRITION STATUS      Continent Diet(Cardiac and carb modified)  AMBULATORY STATUS COMMUNICATION OF NEEDS Skin   Independent Verbally Normal, Other (Comment)(Dry Skin)                       Personal Care Assistance Level of Assistance  Bathing, Feeding, Dressing Bathing Assistance: Maximum assistance Feeding assistance: Limited assistance Dressing Assistance: Limited assistance     Functional Limitations Info  Sight, Hearing, Speech Sight Info: Adequate Hearing Info: Adequate Speech Info: Adequate    SPECIAL CARE FACTORS FREQUENCY  PT (By licensed PT), OT (By licensed OT)     PT Frequency: 5x/wk OT Frequency: 5x/wk            Contractures Contractures Info: Not present    Additional Factors Info  Code Status, Allergies, Insulin Sliding Scale, Psychotropic Code Status Info: Full Code Allergies Info: No known allergies Psychotropic Info: Mitrazapine (Remeron), 15mg  at bedtime Insulin Sliding Scale Info: Novolog injection 0-9 units 3 X daily with meals and Novlog injections 0-5 units at bedtime        Current Medications (02/07/2018):  This is the current hospital active medication list Current Facility-Administered Medications  Medication Dose Route Frequency Provider Last Rate Last Dose  . acetaminophen (TYLENOL) tablet 650 mg  650 mg Oral Q6H PRN Ivor Costa, MD   650 mg at 02/05/18 1305   Or  . acetaminophen (TYLENOL) suppository 650 mg  650 mg Rectal Q6H PRN Ivor Costa, MD      . albuterol (PROVENTIL) (2.5 MG/3ML) 0.083% nebulizer solution 2.5 mg  2.5 mg Nebulization Q4H PRN Ivor Costa, MD   2.5 mg at  02/05/18 1426  . atorvastatin (LIPITOR) tablet 20 mg  20 mg Oral q1800 Ivor Costa, MD   20 mg at 02/06/18 1705  . azithromycin (ZITHROMAX) tablet 250 mg  250 mg Oral Daily Ivor Costa, MD   250 mg at 02/06/18 1130  . cefTRIAXone (ROCEPHIN) 2 g in sodium chloride 0.9 % 100 mL IVPB  2 g Intravenous Q24H Amin, Ankit Chirag, MD 200 mL/hr at 02/06/18 1659 2 g at 02/06/18 1659  . dextromethorphan-guaiFENesin (MUCINEX DM) 30-600 MG per 12 hr tablet 1 tablet  1 tablet Oral BID Eulogio Bear U, DO   1 tablet at 02/06/18 2131  . dextrose 50 % solution 50 mL  50 mL Intravenous PRN Ivor Costa, MD      . diltiazem (CARDIZEM CD) 24 hr capsule 360 mg  360 mg Oral Daily Ivor Costa, MD   360 mg at 02/06/18 0934  . heparin ADULT infusion 100 units/mL (25000 units/255mL sodium chloride 0.45%)  1,700 Units/hr Intravenous Continuous Laren Everts, RPH 17 mL/hr at 02/07/18 0644 1,700 Units/hr at 02/07/18 0644  . hydrALAZINE (APRESOLINE) injection 5 mg  5 mg Intravenous Q2H PRN Ivor Costa, MD      . insulin aspart (novoLOG) injection 0-5 Units  0-5 Units Subcutaneous QHS Vann, Jessica U, DO      . insulin aspart (novoLOG) injection 0-9 Units  0-9 Units Subcutaneous TID WC Vann, Jessica U, DO      . ipratropium-albuterol (DUONEB) 0.5-2.5 (3) MG/3ML nebulizer solution 3 mL  3 mL Nebulization QID [name], MD   3 mL at 02/07/18 0831  . levothyroxine (SYNTHROID, LEVOTHROID) tablet 112 mcg  112 mcg Oral Q24H Ivor Costa, MD   112 mcg at 02/07/18 0554  . metoprolol tartrate (LOPRESSOR) tablet 12.5 mg  12.5 mg Oral BID Dyanne Carrel M, NP   12.5 mg at 02/06/18 2131  . mirtazapine (REMERON) tablet 15 mg  15 mg Oral QHS Ivor Costa, MD   15 mg at 02/06/18 2131  . mometasone-formoterol (DULERA) 100-5 MCG/ACT inhaler 2 puff  2 puff Inhalation BID Ivor Costa, MD   2 puff at 02/07/18 0831  . nitroGLYCERIN (NITROSTAT) SL tablet 0.4 mg  0.4 mg Sublingual Q5 min PRN Ivor Costa, MD      . omega-3 acid ethyl esters (LOVAZA) capsule 1 g   1 g Oral Daily Ivor Costa, MD   1 g at 02/06/18 0934  . ondansetron (ZOFRAN) tablet 4 mg  4 mg Oral Q6H PRN Ivor Costa, MD       Or  . ondansetron [facility]) injection 4 mg  4 mg Intravenous Q6H PRN Ivor Costa, MD      . pantoprazole (PROTONIX) EC tablet 40 mg  40 mg Oral Daily Ivor Costa, MD   40 mg at 02/06/18 0934  . senna-docusate (Senokot-S) tablet 1 tablet  1 tablet Oral QHS PRN Ivor Costa, MD      . Warfarin - Pharmacist Dosing Inpatient   Does not apply q1800 Laren Everts, RPH      . zolpidem (AMBIEN) tablet 5 mg  5 mg Oral QHS PRN Ivor Costa,  MD         Discharge Medications: Please see discharge summary for a list of discharge medications.  Relevant Imaging Results:  Relevant Lab Results:   Additional Information SSN: [id number]  [place], LCSWA

## 2018-02-07 NOTE — Progress Notes (Signed)
ANTICOAGULATION CONSULT NOTE - Follow Up Consult  Pharmacy Consult for heparin Indication: Afib and h/o VTE  Labs: Recent Labs    02/05/18 1311 02/05/18 2338 02/06/18 0653 02/06/18 0810 02/07/18 0347  HGB  --  13.4  --  13.3 13.0  HCT  --  39.4  --  40.9 40.9  PLT  --  238  --  247 236  LABPROT  --  17.8*  --  18.7* 18.9*  INR  --  1.49  --  1.58 1.60  HEPARINUNFRC 0.34 0.48 0.43  --  0.24*  CREATININE 1.34* 1.45*  --  1.13  --     Assessment: 84yo male now subtherapeutic on heparin after several levels at goal; RN notes that pt continues to have blood-streaked sputum, no change from yesterday, Hgb stable.  Goal of Therapy:  Heparin level 0.3-0.7 units/ml   Plan:  Will increase heparin gtt by 2 units/kg/hr to 1700 units/hr and check level in 6 hours.    Wynona Neat, PharmD, BCPS  02/07/2018,6:32 AM

## 2018-02-07 NOTE — Progress Notes (Addendum)
CSW contacted [facility] and they do not have any beds available. CSW spoke with the family and they have selected [facility] in [place]. [facility] has started insurance authorization.   Sara Lee, [employer]

## 2018-02-07 NOTE — Progress Notes (Addendum)
Progress Note    Justin Garner  YIA:[id number] DOB: 1932/04/22  DOA: 02/04/2018 PCP: [name], MD    Brief Narrative:     Medical records reviewed and are as summarized below:  Justin Garner is an 84 y.o. male with medical history significant of hypertension, hyperlipidemia, diabetes mellitus, COPD, GERD, hypothyroidism, depression, remote lung cancer (s/p of lobectomy and chemotherapy), CHF with EF 35%, CKD-2, CAD, atrial fibrillation and PE on Coumadin, who presents with generalized weakness, confusion, urinary incontinence and cough.  Found to have rhinovirus/enterovirus on NP swab.  Assessment/Plan:   Principal Problem:   Acute metabolic encephalopathy Active Problems:   Chronic atrial fibrillation   Pulmonary embolism (HCC)   Essential hypertension, benign   Hypokalemia   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 (HCC)   Chronic systolic CHF (congestive heart failure) (HCC)   Hypothyroidism   HLD (hyperlipidemia)   Chronic obstructive pulmonary disease (HCC)   Leukocytosis   Acute on chronic kidney failure-II   Generalized weakness   Hypoglycemia   Fever due to rhinovirus/enterovirus -NP swab + -was given IV abx-- no PNA on xray initially but plan to recheck today -continue IV Abx Flutter valve/nebs  Acute respiratory failure with hypoxia  -on 2-4L O2 sat is 90% -suspect will need O2 at discharge -does have COPD  Acute metabolic encephalopathy: -Likely multifactorial etiology  -per Dr. Reesa Chew: Grandaughter is at the bedside and tells me patient has been getting more forgetful lately.  He lives at home by himself, overall his cognition has declined since his wife passed away about a year ago -I was able to speak with granddaughter who confirmed the above-- will need formal mental evaluation once medical condition stabilized  Pulmonary embolism: not sure of timing but not recent -Continue Coumadin per pharm   Atrial Fibrillation: CHA2DS2-VASc Score is  6, needs oral anticoagulation. Patient is on Coumadin at home. INR is subtherapeutic 1.37 on admission.  -Continue metoprolol, Cardizem - Coumadin per pharmacy -d/c heparin   HTN:  -Continue metoprolol, Cardizem -resume lasix  Hypokalemia: -continue to replace along with magnesium -recheck  DM type 2 with CKD stage 2 ([place]) and hypoglycemia: - Last A1c 8.9 on 08/18/16, poorly controled-- now 6 -when he came in he was hypoglycemia-- hold oral agent -SSI  Hypoglycemia: -As above  Chronic systolic CHF (congestive heart failure) ([place]):  -2D echo on 08/20/2016 showed EF 35-40%.   -restart lasix at lower dose and give 1 times dose of IV lasix  Hypothyroidism: TSH mildly elevated-- will not change dose yet -Continue home Synthroid  HLD (hyperlipidemia): -lipitor  Acute on chronic kidney failure-stage II:  -improved with IVF to baseline  Acute urinary retention -catheter placed on 11/20 -blood clots found prior   Family Communication/Anticipated D/C date and plan/Code Status   DVT prophylaxis: coumadin Code Status: Full Code.  Family Communication: at bedside Disposition Plan: pending work Optician, dispensing:    None.    Subjective:   Productive cough with some bloody sputum  Objective:    Vitals:   02/07/18 0831 02/07/18 0839 02/07/18 0901 02/07/18 1155  BP:   118/72 130/84  Pulse: 64  70 91  Resp: 18  15 20   Temp:   98.7 F (37.1 C) 98 F (36.7 C)  TempSrc:   Oral Oral  SpO2: 90% 95% 96% 95%  Weight:      Height:        Intake/Output Summary (Last 24 hours) at 02/07/2018 1340  Last data filed at 02/07/2018 0500 Gross per 24 hour  Intake 120 ml  Output 700 ml  Net -580 ml   Filed Weights   02/05/18 0505  Weight: 99.8 kg    Exam: In chair, 4L [place] Coarse breath sounds rrr No LE edema Foley in place with red colored urine   Data Reviewed:   I have personally reviewed following labs and imaging  studies:  Labs: Labs show the following:   Basic Metabolic Panel: Recent Labs  Lab 02/04/18 2223 02/05/18 0330 02/05/18 1311 02/05/18 2338 02/06/18 0810  NA 139 140 138 140 140  K 2.8* 2.8* 3.6 3.0* 3.1*  CL 103 103 105 104 106  CO2 24 27 23 22 24   GLUCOSE 66* 71 118* 101* 87  BUN 30* 26* 27* 24* 21  CREATININE 1.52* 1.39* 1.34* 1.45* 1.13  CALCIUM 8.1* 7.9* 8.2* 7.9* 8.0*  MG  --  1.2*  --  1.1*  --    GFR Estimated Creatinine Clearance: 62.5 mL/min (by C-G formula based on SCr of 1.13 mg/dL). Liver Function Tests: No results for input(s): AST, ALT, ALKPHOS, BILITOT, PROT, ALBUMIN in the last 168 hours. No results for input(s): LIPASE, AMYLASE in the last 168 hours. No results for input(s): AMMONIA in the last 168 hours. Coagulation profile Recent Labs  Lab 02/04/18 2223 02/05/18 2338 02/06/18 0810 02/07/18 0347  INR 1.37 1.49 1.58 1.60    CBC: Recent Labs  Lab 02/04/18 2223 02/05/18 0330 02/05/18 2338 02/06/18 0810 02/07/18 0347  WBC 14.0* 13.6* 15.8* 17.4* 15.6*  NEUTROABS 10.5*  --   --   --   --   HGB 13.8 12.9* 13.4 13.3 13.0  HCT 41.3 41.0 39.4 40.9 40.9  MCV 93.9 95.8 93.4 94.7 95.8  PLT 250 257 238 247 236   Cardiac Enzymes: No results for input(s): CKTOTAL, CKMB, CKMBINDEX, TROPONINI in the last 168 hours. BNP (last 3 results) No results for input(s): PROBNP in the last 8760 hours. CBG: Recent Labs  Lab 02/06/18 1137 02/06/18 1611 02/06/18 2130 02/07/18 0604 02/07/18 1153  GLUCAP 114* 103* 111* 109* 131*   D-Dimer: No results for input(s): DDIMER in the last 72 hours. Hgb A1c: Recent Labs    02/07/18 0347  HGBA1C 6.0*   Lipid Profile: No results for input(s): CHOL, HDL, LDLCALC, TRIG, CHOLHDL, LDLDIRECT in the last 72 hours. Thyroid function studies: Recent Labs    02/05/18 0330  TSH 4.513*   Anemia work up: No results for input(s): VITAMINB12, FOLATE, FERRITIN, TIBC, IRON, RETICCTPCT in the last 72 hours. Sepsis  Labs: Recent Labs  Lab 02/05/18 0330 02/05/18 2338 02/06/18 0810 02/07/18 0347  WBC 13.6* 15.8* 17.4* 15.6*    Microbiology Recent Results (from the past 240 hour(s))  Culture, blood (Routine X 2) w Reflex to ID Panel     Status: None (Preliminary result)   Collection Time: 02/05/18  3:30 AM  Result Value Ref Range Status   Specimen Description BLOOD LEFT ANTECUBITAL  Final   Special Requests   Final    BOTTLES DRAWN AEROBIC AND ANAEROBIC Blood Culture results may not be optimal due to an excessive volume of blood received in culture bottles   Culture   Final    NO GROWTH 2 DAYS Performed at [facility] [address] [place], [place] [address]    Report Status PENDING  Incomplete  Culture, blood (Routine X 2) w Reflex to ID Panel     Status: None (Preliminary result)  Collection Time: 02/05/18  3:33 AM  Result Value Ref Range Status   Specimen Description BLOOD LEFT FOREARM  Final   Special Requests   Final    BOTTLES DRAWN AEROBIC AND ANAEROBIC Blood Culture results may not be optimal due to an excessive volume of blood received in culture bottles   Culture   Final    NO GROWTH 2 DAYS Performed at [facility] [address], [place], [place] [address]    Report Status PENDING  Incomplete  Respiratory Panel by PCR     Status: Abnormal   Collection Time: 02/05/18  5:04 PM  Result Value Ref Range Status   Adenovirus NOT DETECTED NOT DETECTED Final   Coronavirus 229E NOT DETECTED NOT DETECTED Final   Coronavirus HKU1 NOT DETECTED NOT DETECTED Final   Coronavirus NL63 NOT DETECTED NOT DETECTED Final   Coronavirus OC43 NOT DETECTED NOT DETECTED Final   Metapneumovirus NOT DETECTED NOT DETECTED Final   Rhinovirus / Enterovirus DETECTED (A) NOT DETECTED Final   Influenza A NOT DETECTED NOT DETECTED Final   Influenza B NOT DETECTED NOT DETECTED Final   Parainfluenza Virus 1 NOT DETECTED NOT DETECTED Final   Parainfluenza Virus 2 NOT DETECTED NOT DETECTED  Final   Parainfluenza Virus 3 NOT DETECTED NOT DETECTED Final   Parainfluenza Virus 4 NOT DETECTED NOT DETECTED Final   Respiratory Syncytial Virus NOT DETECTED NOT DETECTED Final   Bordetella pertussis NOT DETECTED NOT DETECTED Final   Chlamydophila pneumoniae NOT DETECTED NOT DETECTED Final   Mycoplasma pneumoniae NOT DETECTED NOT DETECTED Final    Comment: Performed at [facility], [place] [address] [place], [place] [address]    Procedures and diagnostic studies:  No results found.  Medications:   . atorvastatin  20 mg Oral q1800  . azithromycin  250 mg Oral Daily  . dextromethorphan-guaiFENesin  1 tablet Oral BID  . diltiazem  360 mg Oral Daily  . insulin aspart  0-5 Units Subcutaneous QHS  . insulin aspart  0-9 Units Subcutaneous TID WC  . ipratropium-albuterol  3 mL Nebulization TID  . levothyroxine  112 mcg Oral Q24H  . metoprolol tartrate  12.5 mg Oral BID  . mirtazapine  15 mg Oral QHS  . mometasone-formoterol  2 puff Inhalation BID  . omega-3 acid ethyl esters  1 g Oral Daily  . pantoprazole  40 mg Oral Daily  . warfarin  3 mg Oral ONCE-1800  . Warfarin - Pharmacist Dosing Inpatient   Does not apply q1800   Continuous Infusions: . cefTRIAXone (ROCEPHIN)  IV 2 g (02/06/18 1659)     LOS: 0 days   Geradine Girt  [facility]   *Please refer to [email or web address], password [email or web address] to get updated schedule on who will round on this patient, as hospitalists switch teams weekly. If 7PM-7AM, please contact night-coverage at [email or web address], password [email or web address] for any overnight needs.  02/07/2018, 1:40 PM

## 2018-02-07 NOTE — NC FL2 (Signed)
[place] LEVEL OF CARE SCREENING TOOL     IDENTIFICATION  Patient Name: Justin Garner Birthdate: 04-11-1932 Sex: male Admission Date (Current Location): 02/04/2018  [facility] and [place] Number:  Herbalist and Address:  [facility] [place], [place] [address], [place], [place] [address]      Provider Number: [id number]  Attending Physician Name and Address:  Geradine Girt, DO  Relative Name and Phone Number:  Gunnar Fusi, Daughter, [phone] and Jodelle Gross, [phone]    Current Level of Care: Hospital Recommended Level of Care: [place] Prior Approval Number:    Date Approved/Denied:   PASRR Number: [id number]  Discharge Plan: SNF    Current Diagnoses: Patient Active Problem List   Diagnosis Date Noted  . Generalized weakness 02/05/2018  . Hypoglycemia 02/05/2018  . Hemoptysis 04/26/2017  . Lobar pneumonia ([place]) 12/20/2016  . Atrial fibrillation with RVR ([place]) 06/09/2016  . Acute metabolic encephalopathy 74/02/8785  . COPD exacerbation ([place]) 11/15/2015  . Acute on chronic kidney failure-II 11/15/2015  . Malnutrition of moderate degree 11/15/2015  . Leukocytosis 07/12/2015  . Chronic obstructive pulmonary disease ([place])   . Cardiomyopathy ([place])   . CAD in native artery   . Hypomagnesemia   . CAP (community acquired pneumonia) 06/13/2015  . Acute on chronic respiratory failure with hypoxia ([place]) 06/13/2015  . Hypothyroidism 06/13/2015  . HLD (hyperlipidemia) 06/13/2015  . Chronic systolic CHF (congestive heart failure) ([place])   . Protein-calorie malnutrition, severe ([place]) 12/29/2013  . Hypokalemia 12/28/2013  . Sepsis ([place]) 12/28/2013  . 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 ([place]) 12/28/2013  . Acute systolic heart failure ([place]) 08/12/2013  . Primary cancer of right upper lobe of lung ([place]) 06/30/2013  . Coronary atherosclerosis of native coronary artery 05/28/2013  . Essential  hypertension, benign 05/28/2013  . Chronic atrial fibrillation 12/24/2012  . Pulmonary embolism ([place]) 12/24/2012  . COPD with acute bronchitis ([place]) 04/23/2008    Orientation RESPIRATION BLADDER Height & Weight     Self, Situation, Place  Normal Incontinent Weight: 220 lb (99.8 kg) Height:  6\' 2"  (188 cm)  BEHAVIORAL SYMPTOMS/MOOD NEUROLOGICAL BOWEL NUTRITION STATUS      Continent Diet(Cardiac and carb modified)  AMBULATORY STATUS COMMUNICATION OF NEEDS Skin   Independent Verbally Normal, Other (Comment)(Dry Skin)                       Personal Care Assistance Level of Assistance  Bathing, Feeding, Dressing Bathing Assistance: Maximum assistance Feeding assistance: Limited assistance Dressing Assistance: Limited assistance     Functional Limitations Info  Sight, Hearing, Speech Sight Info: Adequate Hearing Info: Adequate Speech Info: Adequate    SPECIAL CARE FACTORS FREQUENCY  PT (By licensed PT), OT (By licensed OT)     PT Frequency: 5x/wk OT Frequency: 5x/wk            Contractures Contractures Info: Not present    Additional Factors Info  Code Status, Allergies, Insulin Sliding Scale, Psychotropic Code Status Info: Full Code Allergies Info: No known allergies Psychotropic Info: Mitrazapine (Remeron), 15mg  at bedtime Insulin Sliding Scale Info: Novolog injection 0-9 units 3 X daily with meals and Novlog injections 0-5 units at bedtime        Current Medications (02/07/2018):  This is the current hospital active medication list Current Facility-Administered Medications  Medication Dose Route Frequency Provider Last Rate Last Dose  . acetaminophen (TYLENOL) tablet 650 mg  650 mg Oral  Q6H PRN Ivor Costa, MD   650 mg at 02/05/18 1305   Or  . acetaminophen (TYLENOL) suppository 650 mg  650 mg Rectal Q6H PRN Ivor Costa, MD      . albuterol (PROVENTIL) (2.5 MG/3ML) 0.083% nebulizer solution 2.5 mg  2.5 mg Nebulization Q4H PRN Ivor Costa, MD   2.5 mg at  02/05/18 1426  . atorvastatin (LIPITOR) tablet 20 mg  20 mg Oral q1800 Ivor Costa, MD   20 mg at 02/06/18 1705  . azithromycin (ZITHROMAX) tablet 250 mg  250 mg Oral Daily Ivor Costa, MD   250 mg at 02/07/18 1107  . cefTRIAXone (ROCEPHIN) 2 g in sodium chloride 0.9 % 100 mL IVPB  2 g Intravenous Q24H Amin, Ankit Chirag, MD 200 mL/hr at 02/06/18 1659 2 g at 02/06/18 1659  . dextromethorphan-guaiFENesin (MUCINEX DM) 30-600 MG per 12 hr tablet 1 tablet  1 tablet Oral BID Eulogio Bear U, DO   1 tablet at 02/07/18 1107  . dextrose 50 % solution 50 mL  50 mL Intravenous PRN Ivor Costa, MD      . diltiazem (CARDIZEM CD) 24 hr capsule 360 mg  360 mg Oral Daily Ivor Costa, MD   360 mg at 02/07/18 1106  . heparin ADULT infusion 100 units/mL (25000 units/232mL sodium chloride 0.45%)  1,700 Units/hr Intravenous Continuous Laren Everts, RPH 17 mL/hr at 02/07/18 1115 1,700 Units/hr at 02/07/18 1115  . hydrALAZINE (APRESOLINE) injection 5 mg  5 mg Intravenous Q2H PRN Ivor Costa, MD      . insulin aspart (novoLOG) injection 0-5 Units  0-5 Units Subcutaneous QHS Vann, Jessica U, DO      . insulin aspart (novoLOG) injection 0-9 Units  0-9 Units Subcutaneous TID WC Vann, Jessica U, DO      . ipratropium-albuterol (DUONEB) 0.5-2.5 (3) MG/3ML nebulizer solution 3 mL  3 mL Nebulization TID Ivor Costa, MD      . levothyroxine (SYNTHROID, LEVOTHROID) tablet 112 mcg  112 mcg Oral Q24H Ivor Costa, MD   112 mcg at 02/07/18 0554  . metoprolol tartrate (LOPRESSOR) tablet 12.5 mg  12.5 mg Oral BID Dyanne Carrel M, NP   12.5 mg at 02/07/18 1107  . mirtazapine (REMERON) tablet 15 mg  15 mg Oral QHS Ivor Costa, MD   15 mg at 02/06/18 2131  . mometasone-formoterol (DULERA) 100-5 MCG/ACT inhaler 2 puff  2 puff Inhalation BID Ivor Costa, MD   2 puff at 02/07/18 0831  . nitroGLYCERIN (NITROSTAT) SL tablet 0.4 mg  0.4 mg Sublingual Q5 min PRN Ivor Costa, MD      . omega-3 acid ethyl esters (LOVAZA) capsule 1 g  1 g Oral Daily Ivor Costa, MD   1 g at 02/07/18 1106  . ondansetron (ZOFRAN) tablet 4 mg  4 mg Oral Q6H PRN Ivor Costa, MD       Or  . ondansetron [facility]) injection 4 mg  4 mg Intravenous Q6H PRN Ivor Costa, MD      . pantoprazole (PROTONIX) EC tablet 40 mg  40 mg Oral Daily Ivor Costa, MD   40 mg at 02/07/18 1106  . senna-docusate (Senokot-S) tablet 1 tablet  1 tablet Oral QHS PRN Ivor Costa, MD      . Warfarin - Pharmacist Dosing Inpatient   Does not apply q1800 Laren Everts, RPH      . zolpidem (AMBIEN) tablet 5 mg  5 mg Oral QHS PRN Ivor Costa, MD  Discharge Medications: Please see discharge summary for a list of discharge medications.  Relevant Imaging Results:  Relevant Lab Results:   Additional Information SSN: [id number]  Geralynn Ochs, LCSW

## 2018-02-08 DIAGNOSIS — Z9221 Personal history of antineoplastic chemotherapy: Secondary | ICD-10-CM | POA: Diagnosis not present

## 2018-02-08 DIAGNOSIS — N182 Chronic kidney disease, stage 2 (mild): Secondary | ICD-10-CM | POA: Diagnosis present

## 2018-02-08 DIAGNOSIS — E039 Hypothyroidism, unspecified: Secondary | ICD-10-CM | POA: Diagnosis present

## 2018-02-08 DIAGNOSIS — I4510 Unspecified right bundle-branch block: Secondary | ICD-10-CM | POA: Diagnosis present

## 2018-02-08 DIAGNOSIS — Z79890 Hormone replacement therapy (postmenopausal): Secondary | ICD-10-CM | POA: Diagnosis not present

## 2018-02-08 DIAGNOSIS — E876 Hypokalemia: Secondary | ICD-10-CM | POA: Diagnosis present

## 2018-02-08 DIAGNOSIS — E785 Hyperlipidemia, unspecified: Secondary | ICD-10-CM | POA: Diagnosis present

## 2018-02-08 DIAGNOSIS — Z7901 Long term (current) use of anticoagulants: Secondary | ICD-10-CM | POA: Diagnosis not present

## 2018-02-08 DIAGNOSIS — Z9049 Acquired absence of other specified parts of digestive tract: Secondary | ICD-10-CM | POA: Diagnosis not present

## 2018-02-08 DIAGNOSIS — Z86711 Personal history of pulmonary embolism: Secondary | ICD-10-CM | POA: Diagnosis not present

## 2018-02-08 DIAGNOSIS — N179 Acute kidney failure, unspecified: Secondary | ICD-10-CM | POA: Diagnosis present

## 2018-02-08 DIAGNOSIS — I5023 Acute on chronic systolic (congestive) heart failure: Secondary | ICD-10-CM | POA: Diagnosis present

## 2018-02-08 DIAGNOSIS — I2510 Atherosclerotic heart disease of native coronary artery without angina pectoris: Secondary | ICD-10-CM | POA: Diagnosis present

## 2018-02-08 DIAGNOSIS — I482 Chronic atrial fibrillation, unspecified: Secondary | ICD-10-CM | POA: Diagnosis present

## 2018-02-08 DIAGNOSIS — E162 Hypoglycemia, unspecified: Secondary | ICD-10-CM | POA: Diagnosis not present

## 2018-02-08 DIAGNOSIS — Z85118 Personal history of other malignant neoplasm of bronchus and lung: Secondary | ICD-10-CM | POA: Diagnosis not present

## 2018-02-08 DIAGNOSIS — Z7951 Long term (current) use of inhaled steroids: Secondary | ICD-10-CM | POA: Diagnosis not present

## 2018-02-08 DIAGNOSIS — I480 Paroxysmal atrial fibrillation: Secondary | ICD-10-CM | POA: Diagnosis present

## 2018-02-08 DIAGNOSIS — J9601 Acute respiratory failure with hypoxia: Secondary | ICD-10-CM

## 2018-02-08 DIAGNOSIS — I130 Hypertensive heart and chronic kidney disease with heart failure and stage 1 through stage 4 chronic kidney disease, or unspecified chronic kidney disease: Secondary | ICD-10-CM | POA: Diagnosis present

## 2018-02-08 DIAGNOSIS — Z7984 Long term (current) use of oral hypoglycemic drugs: Secondary | ICD-10-CM | POA: Diagnosis not present

## 2018-02-08 DIAGNOSIS — E1122 Type 2 diabetes mellitus with diabetic chronic kidney disease: Secondary | ICD-10-CM | POA: Diagnosis present

## 2018-02-08 DIAGNOSIS — Z923 Personal history of irradiation: Secondary | ICD-10-CM | POA: Diagnosis not present

## 2018-02-08 DIAGNOSIS — K219 Gastro-esophageal reflux disease without esophagitis: Secondary | ICD-10-CM | POA: Diagnosis present

## 2018-02-08 DIAGNOSIS — G9341 Metabolic encephalopathy: Secondary | ICD-10-CM | POA: Diagnosis not present

## 2018-02-08 DIAGNOSIS — J441 Chronic obstructive pulmonary disease with (acute) exacerbation: Secondary | ICD-10-CM | POA: Diagnosis present

## 2018-02-08 DIAGNOSIS — R410 Disorientation, unspecified: Secondary | ICD-10-CM

## 2018-02-08 LAB — BASIC METABOLIC PANEL
Anion gap: 9 (ref 5–15)
BUN: 33 mg/dL — ABNORMAL HIGH (ref 8–23)
CALCIUM: 8.8 mg/dL — AB (ref 8.9–10.3)
CO2: 23 mmol/L (ref 22–32)
Chloride: 108 mmol/L (ref 98–111)
Creatinine, Ser: 1.46 mg/dL — ABNORMAL HIGH (ref 0.61–1.24)
GFR, EST AFRICAN AMERICAN: 49 mL/min — AB (ref >60)
GFR, EST NON AFRICAN AMERICAN: 43 mL/min — AB (ref >60)
GLUCOSE: 135 mg/dL — AB (ref 70–99)
POTASSIUM: 3.6 mmol/L (ref 3.5–5.1)
Sodium: 140 mmol/L (ref 135–145)

## 2018-02-08 LAB — GLUCOSE, CAPILLARY
GLUCOSE-CAPILLARY: 128 mg/dL — AB (ref 70–99)
GLUCOSE-CAPILLARY: 135 mg/dL — AB (ref 70–99)
GLUCOSE-CAPILLARY: 102 mg/dL — AB (ref 70–99)
Glucose-Capillary: 121 mg/dL — ABNORMAL HIGH (ref 70–99)

## 2018-02-08 LAB — PROTIME-INR
INR: 1.57
Prothrombin Time: 18.6 seconds — ABNORMAL HIGH (ref 11.4–15.2)

## 2018-02-08 MED ORDER — WARFARIN SODIUM 7.5 MG PO TABS
7.5000 mg | ORAL_TABLET | ORAL | Status: AC
  Administered 2018-02-08: 7.5 mg via ORAL
  Filled 2018-02-08: qty 1

## 2018-02-08 MED ORDER — MAGNESIUM SULFATE IN D5W 1-5 GM/100ML-% IV SOLN
1.0000 g | Freq: Once | INTRAVENOUS | Status: AC
  Administered 2018-02-08: 1 g via INTRAVENOUS
  Filled 2018-02-08: qty 100

## 2018-02-08 MED ORDER — OLANZAPINE 2.5 MG PO TABS
2.5000 mg | ORAL_TABLET | Freq: Every day | ORAL | Status: DC
  Administered 2018-02-08 – 2018-02-10 (×3): 2.5 mg via ORAL
  Filled 2018-02-08 (×3): qty 1

## 2018-02-08 MED ORDER — SODIUM CHLORIDE 0.9 % IV SOLN
1.0000 g | INTRAVENOUS | Status: DC
  Administered 2018-02-08 – 2018-02-09 (×2): 1 g via INTRAVENOUS
  Filled 2018-02-08 (×3): qty 10

## 2018-02-08 NOTE — Progress Notes (Signed)
Patient placed on continuous pulse ox at this time. O2  Sat remains between at 93 on 4L. HR between 98 -107. Pt is resting quietly in bed.   Ave Filter, RN

## 2018-02-08 NOTE — Progress Notes (Signed)
ANTICOAGULATION CONSULT NOTE - Follow Up Consult  Pharmacy Consult for warfarin  Indication: atrial fibrillation and h/o pulmonary embolus  No Known Allergies  Patient Measurements: Height: 6\' 2"  (188 cm) Weight: 220 lb (99.8 kg) IBW/kg (Calculated) : 82.2 Heparin Dosing Weight: 99.8kg  Vital Signs: Temp: 98.1 F (36.7 C) (11/22 0756) Temp Source: Oral (11/22 0756) BP: 124/92 (11/22 0756) Pulse Rate: 88 (11/22 0756)  Labs: Recent Labs    02/05/18 2338 02/06/18 0653 02/06/18 0810 02/07/18 0347 02/07/18 1142 02/07/18 1340 02/08/18 0300  HGB 13.4  --  13.3 13.0  --   --   --   HCT 39.4  --  40.9 40.9  --   --   --   PLT 238  --  247 236  --   --   --   LABPROT 17.8*  --  18.7* 18.9*  --   --  18.6*  INR 1.49  --  1.58 1.60  --   --  1.57  HEPARINUNFRC 0.48 0.43  --  0.24* 0.23*  --   --   CREATININE 1.45*  --  1.13  --   --  1.39*  --     Estimated Creatinine Clearance: 50.8 mL/min (A) (by C-G formula based on SCr of 1.39 mg/dL (H)).  Assessment: 84 yo M on Coumadin 5mg  daily exc for 7.5mg  on Tues/Thurs for h/o Afib and h/o PE (?12/24/12). INR goal 2-3. Also on azithromycin currently. Also had blood in urine and bloody sputum on 11/20. Stopped heparin bridging. INR is low at 1.57. Missed Coumadin dose on 11/21. CBC stable.   Goal of Therapy:  INR 2-3 Monitor platelets by anticoagulation protocol: Yes  Plan:  Give Coumadin 7.5mg  PO x 1 now Monitor daily INR, CBC, s/s of bleed   Elenor Quinones, PharmD, BCPS Clinical Pharmacist Phone number [phone] 02/08/2018 8:41 AM

## 2018-02-08 NOTE — Care Management Obs Status (Signed)
[place] NOTIFICATION   Patient Details  Name: Justin Garner MRN: [id number] Date of Birth: [birth year]   Medicare Observation Status Notification Given:  Yes    Pollie Friar, RN 02/08/2018, 3:09 PM

## 2018-02-08 NOTE — Progress Notes (Signed)
Per infection control team, pt is not longer required to be on contact isolation. Isolation discontinued. Pt updated.   Ave Filter, RN

## 2018-02-08 NOTE — Progress Notes (Signed)
Patient continue to get up OOB to go home. He noted tachepnic and SOB every time he gets up out of bed. Once he settles, he appears to be ok. Pt refused breakfast and lunch today. Staffs assisted pt into chair this AM and back to bed. Offer pt to be in chair for lunch, pt verbalized that "im not eating so it doesn't matter". Pt discuss of his wife passing and apepars sad about it. Will have continuous pulse ox apply for O2 monitoring. Will continue to give emotional support. Frequent round for safety. Family updated this AM. MD aware of pt's status.  Ave Filter, RN

## 2018-02-08 NOTE — Progress Notes (Signed)
Palliative Medicine consult noted. Due to high referral volume, there may be a delay seeing this patient. Please call the Palliative Medicine Team office at [phone] if recommendations are needed in the interim.  Thank you for inviting [place] to see this patient.  Marjie Skiff Ernst [place], RN, BSN, [facility] Palliative Medicine Team 02/08/2018 1:50 PM Office [phone]

## 2018-02-08 NOTE — Progress Notes (Signed)
CSW received authorization for patient to admit to SNF. Per MD, patient not medically stable for transport today. CSW to continue to follow.  Laveda Abbe, [place] Clinical Social Worker [phone]

## 2018-02-08 NOTE — Progress Notes (Signed)
Progress Note    Justin Garner  VZD:[id number] DOB: 08-13-1932  DOA: 02/04/2018 PCP: [name], MD    Brief Narrative:     Medical records reviewed and are as summarized below:  Justin Garner is an 84 y.o. male with medical history significant of hypertension, hyperlipidemia, diabetes mellitus, COPD, GERD, hypothyroidism, depression, remote lung cancer (s/p of lobectomy and chemotherapy), CHF with EF 35%, CKD-2, CAD, atrial fibrillation and PE on Coumadin, who presents with generalized weakness, confusion, urinary incontinence and cough.  Found to have rhinovirus/enterovirus on NP swab.  Despite IV abx and supportive care, patient has not improved and actually has worsened with delirium on top of his dementia.    Assessment/Plan:   Principal Problem:   Acute metabolic encephalopathy Active Problems:   Chronic atrial fibrillation   Pulmonary embolism (HCC)   Essential hypertension, benign   Hypokalemia   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 (HCC)   Chronic systolic CHF (congestive heart failure) (HCC)   Hypothyroidism   HLD (hyperlipidemia)   Chronic obstructive pulmonary disease (HCC)   Leukocytosis   Acute on chronic kidney failure-II   Generalized weakness   Hypoglycemia   Acute respiratory failure with hypoxia (HCC)   Fever due to rhinovirus/enterovirus/atypical pneumonia with leukocytosis -NP swab + -rocephin and azithromycin-- plan to finish 5 day course (x ray ? Atypical infection -continues to have an elevated WBC count Flutter valve/nebs- pulmonary toilet -ambulate He continues to require O2 and have coarse breath sounds-- continue pulm toilet as best patient can participate -palliative care consult for [place] as he continues to decline despite treatment -x ray was repeated yesterday and showed ? Volume overload-- given IV lasix with good result (hold PO lasix for now)  Acute respiratory failure with hypoxia  -on 2-4L O2 sat is 90% -RA he was  84 -suspect will need O2 at discharge -does have COPD at baseline  Acute metabolic encephalopathy: -Likely multifactorial etiology  -per Dr. Reesa Chew: Grandaughter is at the bedside and tells me patient has been getting more forgetful lately.  He lives at home by himself, overall his cognition has declined since his wife passed away about a year ago -I was able to speak with granddaughter who confirmed the above-- will need formal mental evaluation once medical condition stabilized -palliative care consult- unable to reach granddaughter to update but she had met with palliative care with her grandmother after her stroke -denies alcohol use so doubt withdrawal -will try tiny dose of zyprexa tonight to see if helps  Pulmonary embolism: not sure of timing but not recent -Continue Coumadin per pharmacy  Atrial Fibrillation: CHA2DS2-VASc Score is 6, needs oral anticoagulation. Patient is on Coumadin at home. INR was subtherapeutic 1.37 on admission.  -Continue metoprolol, Cardizem - Coumadin per pharmacy -d/c heparin due to hematuria and hemoptysis (has had this is past   HTN:  -Continue metoprolol, Cardizem -resume lasix  Hypokalemia/hypomagnesemia -continue to replace along with magnesium -recheck  DM type 2 with CKD stage 2 ([place]) and hypoglycemia: - Last A1c 8.9 on 08/18/16, poorly controled-- now 6 -when he came in he was hypoglycemia-- hold oral agents -SSI   Chronic systolic CHF (congestive heart failure) ([place]):  -2D echo on 08/20/2016 showed EF 35-40%.   -monitor for volume overload  Hypothyroidism: TSH mildly elevated-- will not change dose yet -Continue home Synthroid  HLD (hyperlipidemia): -lipitor  Acute on chronic kidney failure-stage II:  -monitor closely  Acute urinary retention -catheter placed on 11/20 -urine  in foley has cleared w/o further hematuria today  Patient has not improved despite proper treatment, plan to change to inpatient for further work  up and management.   Family Communication/Anticipated D/C date and plan/Code Status   DVT prophylaxis: coumadin Code Status: Full Code.  Family Communication: at bedside 11/21 Disposition Plan: pending work up/improvement   Medical Consultants:   Palliative care   Subjective:   Refusing to eat "it doesn't matter"  Objective:    Vitals:   02/08/18 0756 02/08/18 0853 02/08/18 1130 02/08/18 1205  BP: (!) 124/92  119/82   Pulse: 88  78   Resp: (!) 22  (!) 32 (!) 28  Temp: 98.1 F (36.7 C)  98.2 F (36.8 C)   TempSrc: Oral  Axillary   SpO2:  95% 92%   Weight:      Height:        Intake/Output Summary (Last 24 hours) at 02/08/2018 1532 Last data filed at 02/08/2018 1529 Gross per 24 hour  Intake 420 ml  Output 2200 ml  Net -1780 ml   Filed Weights   02/05/18 0505  Weight: 99.8 kg    Exam: Ill appearing, in chair, slumped down Foley with yellow urine in bag Coarse breath sounds-- patient not able to cough to clear as it sounded like upper airway No LE edema Chronic skin darkening on his arms   Data Reviewed:   I have personally reviewed following labs and imaging studies:  Labs: Labs show the following:   Basic Metabolic Panel: Recent Labs  Lab 02/05/18 0330 02/05/18 1311 02/05/18 2338 02/06/18 0810 02/07/18 1340 02/07/18 1357 02/08/18 1139  NA 140 138 140 140 138  --  140  K 2.8* 3.6 3.0* 3.1* 3.5  --  3.6  CL 103 105 104 106 106  --  108  CO2 '27 23 22 24 23  '$ --  23  GLUCOSE 71 118* 101* 87 107*  --  135*  BUN 26* 27* 24* 21 27*  --  33*  CREATININE 1.39* 1.34* 1.45* 1.13 1.39*  --  1.46*  CALCIUM 7.9* 8.2* 7.9* 8.0* 8.6*  --  8.8*  MG 1.2*  --  1.1*  --   --  1.7  --    GFR Estimated Creatinine Clearance: 48.4 mL/min (A) (by C-G formula based on SCr of 1.46 mg/dL (H)). Liver Function Tests: No results for input(s): AST, ALT, ALKPHOS, BILITOT, PROT, ALBUMIN in the last 168 hours. No results for input(s): LIPASE, AMYLASE in the last 168  hours. No results for input(s): AMMONIA in the last 168 hours. Coagulation profile Recent Labs  Lab 02/04/18 2223 02/05/18 2338 02/06/18 0810 02/07/18 0347 02/08/18 0300  INR 1.37 1.49 1.58 1.60 1.57    CBC: Recent Labs  Lab 02/04/18 2223 02/05/18 0330 02/05/18 2338 02/06/18 0810 02/07/18 0347  WBC 14.0* 13.6* 15.8* 17.4* 15.6*  NEUTROABS 10.5*  --   --   --   --   HGB 13.8 12.9* 13.4 13.3 13.0  HCT 41.3 41.0 39.4 40.9 40.9  MCV 93.9 95.8 93.4 94.7 95.8  PLT 250 257 238 247 236   Cardiac Enzymes: No results for input(s): CKTOTAL, CKMB, CKMBINDEX, TROPONINI in the last 168 hours. BNP (last 3 results) No results for input(s): PROBNP in the last 8760 hours. CBG: Recent Labs  Lab 02/07/18 1153 02/07/18 1659 02/07/18 2127 02/08/18 0646 02/08/18 1121  GLUCAP 131* 82 117* 128* 121*   D-Dimer: No results for input(s): DDIMER in the last 72  hours. Hgb A1c: Recent Labs    02/07/18 0347  HGBA1C 6.0*   Lipid Profile: No results for input(s): CHOL, HDL, LDLCALC, TRIG, CHOLHDL, LDLDIRECT in the last 72 hours. Thyroid function studies: No results for input(s): TSH, T4TOTAL, T3FREE, THYROIDAB in the last 72 hours.  Invalid input(s): FREET3 Anemia work up: No results for input(s): VITAMINB12, FOLATE, FERRITIN, TIBC, IRON, RETICCTPCT in the last 72 hours. Sepsis Labs: Recent Labs  Lab 02/05/18 0330 02/05/18 2338 02/06/18 0810 02/07/18 0347  WBC 13.6* 15.8* 17.4* 15.6*    Microbiology Recent Results (from the past 240 hour(s))  Culture, blood (Routine X 2) w Reflex to ID Panel     Status: None (Preliminary result)   Collection Time: 02/05/18  3:30 AM  Result Value Ref Range Status   Specimen Description BLOOD LEFT ANTECUBITAL  Final   Special Requests   Final    BOTTLES DRAWN AEROBIC AND ANAEROBIC Blood Culture results may not be optimal due to an excessive volume of blood received in culture bottles   Culture   Final    NO GROWTH 3 DAYS Performed at [facility], [place] [address], [place], [place] [address]    Report Status PENDING  Incomplete  Culture, blood (Routine X 2) w Reflex to ID Panel     Status: None (Preliminary result)   Collection Time: 02/05/18  3:33 AM  Result Value Ref Range Status   Specimen Description BLOOD LEFT FOREARM  Final   Special Requests   Final    BOTTLES DRAWN AEROBIC AND ANAEROBIC Blood Culture results may not be optimal due to an excessive volume of blood received in culture bottles   Culture   Final    NO GROWTH 3 DAYS Performed at [facility], [place] [address] [place], [place] [address]    Report Status PENDING  Incomplete  Respiratory Panel by PCR     Status: Abnormal   Collection Time: 02/05/18  5:04 PM  Result Value Ref Range Status   Adenovirus NOT DETECTED NOT DETECTED Final   Coronavirus 229E NOT DETECTED NOT DETECTED Final   Coronavirus HKU1 NOT DETECTED NOT DETECTED Final   Coronavirus NL63 NOT DETECTED NOT DETECTED Final   Coronavirus OC43 NOT DETECTED NOT DETECTED Final   Metapneumovirus NOT DETECTED NOT DETECTED Final   Rhinovirus / Enterovirus DETECTED (A) NOT DETECTED Final   Influenza A NOT DETECTED NOT DETECTED Final   Influenza B NOT DETECTED NOT DETECTED Final   Parainfluenza Virus 1 NOT DETECTED NOT DETECTED Final   Parainfluenza Virus 2 NOT DETECTED NOT DETECTED Final   Parainfluenza Virus 3 NOT DETECTED NOT DETECTED Final   Parainfluenza Virus 4 NOT DETECTED NOT DETECTED Final   Respiratory Syncytial Virus NOT DETECTED NOT DETECTED Final   Bordetella pertussis NOT DETECTED NOT DETECTED Final   Chlamydophila pneumoniae NOT DETECTED NOT DETECTED Final   Mycoplasma pneumoniae NOT DETECTED NOT DETECTED Final    Comment: Performed at [facility], [place] [address] [place], [place] [address]    Procedures and diagnostic studies:  Dg Chest Port 1 View  Result Date: 02/07/2018 CLINICAL DATA:  84 year old male with fever, productive cough, confusion. EXAM: PORTABLE  CHEST 1 VIEW COMPARISON:  Chest radiographs 02/04/2018 and earlier. FINDINGS: Portable AP semi upright view at 1438 hours. A small right pleural effusion appears increased. Stable cardiac size and mediastinal contours. Pulmonary vascularity and/or interstitial opacity appears increased. No pneumothorax. No confluent pulmonary opacity. Negative visible bowel gas pattern. No  acute osseous abnormality identified. IMPRESSION: Increased pulmonary interstitial opacity and small right pleural effusion since 02/04/2018. Consider mild interstitial edema versus viral/atypical respiratory infection. Electronically Signed   By: Genevie Ann M.D.   On: 02/07/2018 15:07    Medications:   . atorvastatin  20 mg Oral q1800  . azithromycin  250 mg Oral Daily  . dextromethorphan-guaiFENesin  1 tablet Oral BID  . diltiazem  360 mg Oral Daily  . furosemide  20 mg Oral Daily  . insulin aspart  0-5 Units Subcutaneous QHS  . insulin aspart  0-9 Units Subcutaneous TID WC  . ipratropium-albuterol  3 mL Nebulization TID  . levothyroxine  112 mcg Oral Q24H  . metoprolol tartrate  12.5 mg Oral BID  . mirtazapine  15 mg Oral QHS  . mometasone-formoterol  2 puff Inhalation BID  . omega-3 acid ethyl esters  1 g Oral Daily  . pantoprazole  40 mg Oral Daily  . Warfarin - Pharmacist Dosing Inpatient   Does not apply q1800   Continuous Infusions: . cefTRIAXone (ROCEPHIN)  IV 2 g (02/07/18 1807)     LOS: 0 days   Geradine Girt  [facility]   *Please refer to [email or web address], password [email or web address] to get updated schedule on who will round on this patient, as hospitalists switch teams weekly. If 7PM-7AM, please contact night-coverage at [email or web address], password [email or web address] for any overnight needs.  02/08/2018, 3:32 PM

## 2018-02-09 DIAGNOSIS — G9341 Metabolic encephalopathy: Principal | ICD-10-CM

## 2018-02-09 LAB — BASIC METABOLIC PANEL
ANION GAP: 12 (ref 5–15)
BUN: 31 mg/dL — AB (ref 8–23)
CHLORIDE: 105 mmol/L (ref 98–111)
CO2: 24 mmol/L (ref 22–32)
Calcium: 8.6 mg/dL — ABNORMAL LOW (ref 8.9–10.3)
Creatinine, Ser: 1.57 mg/dL — ABNORMAL HIGH (ref 0.61–1.24)
GFR calc non Af Amer: 39 mL/min — ABNORMAL LOW (ref >60)
GFR, EST AFRICAN AMERICAN: 45 mL/min — AB (ref >60)
Glucose, Bld: 110 mg/dL — ABNORMAL HIGH (ref 70–99)
POTASSIUM: 3.1 mmol/L — AB (ref 3.5–5.1)
SODIUM: 141 mmol/L (ref 135–145)

## 2018-02-09 LAB — CBC
HCT: 40 % (ref 39.0–52.0)
Hemoglobin: 13.3 g/dL (ref 13.0–17.0)
MCH: 31.5 pg (ref 26.0–34.0)
MCHC: 33.3 g/dL (ref 30.0–36.0)
MCV: 94.8 fL (ref 80.0–100.0)
NRBC: 0 % (ref 0.0–0.2)
PLATELETS: 239 10*3/uL (ref 150–400)
RBC: 4.22 MIL/uL (ref 4.22–5.81)
RDW: 15.1 % (ref 11.5–15.5)
WBC: 15.1 10*3/uL — AB (ref 4.0–10.5)

## 2018-02-09 LAB — GLUCOSE, CAPILLARY
GLUCOSE-CAPILLARY: 114 mg/dL — AB (ref 70–99)
Glucose-Capillary: 163 mg/dL — ABNORMAL HIGH (ref 70–99)
Glucose-Capillary: 121 mg/dL — ABNORMAL HIGH (ref 70–99)
Glucose-Capillary: 144 mg/dL — ABNORMAL HIGH (ref 70–99)

## 2018-02-09 LAB — PROTIME-INR
INR: 1.89
PROTHROMBIN TIME: 21.4 s — AB (ref 11.4–15.2)

## 2018-02-09 MED ORDER — POTASSIUM CHLORIDE 10 MEQ/100ML IV SOLN
10.0000 meq | INTRAVENOUS | Status: AC
  Administered 2018-02-09 (×3): 10 meq via INTRAVENOUS
  Filled 2018-02-09 (×3): qty 100

## 2018-02-09 MED ORDER — WARFARIN SODIUM 5 MG PO TABS
5.0000 mg | ORAL_TABLET | Freq: Once | ORAL | Status: AC
  Administered 2018-02-09: 5 mg via ORAL
  Filled 2018-02-09: qty 1

## 2018-02-09 NOTE — Progress Notes (Signed)
Progress Note    Justin Garner  KDX:[id number] DOB: August 22, 1932  DOA: 02/04/2018 PCP: [name], MD    Brief Narrative:   84 y.o. male with medical history significant of hypertension, hyperlipidemia, diabetes mellitus, COPD, GERD, hypothyroidism, depression, remote lung cancer (s/p of lobectomy and chemotherapy), CHF with EF 35%, CKD-2, CAD, atrial fibrillation and PE on Coumadin, who presents with generalized weakness, confusion, urinary incontinence and cough.    Admit 2 2019 hemoptysis broncheictasis  Found to have rhinovirus/enterovirus on NP swab.  Despite IV abx and supportive care, patient has not improved  Assessment/Plan:   Principal Problem:   Acute metabolic encephalopathy Active Problems:   Chronic atrial fibrillation   Pulmonary embolism (HCC)   Essential hypertension, benign   Hypokalemia   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 (HCC)   Chronic systolic CHF (congestive heart failure) (HCC)   Hypothyroidism   HLD (hyperlipidemia)   Chronic obstructive pulmonary disease (HCC)   Leukocytosis   Acute on chronic kidney failure-II   Generalized weakness   Hypoglycemia   Acute respiratory failure with hypoxia (HCC)   Fever due to rhinovirus/enterovirus/atypical pneumonia with leukocytosis -NP swab + -rocephin and azithromycin-- plan to finish 5 day course (x ray ? Atypical infection -continues to have an elevated WBC count Flutter valve/nebs- pulmonary toilet -ambulate He continues to require O2 and have coarse breath sounds-- continue pulm toilet as best patient can participate -palliative care consult for [place] as he continues to decline despite treatment -x ray was repeated yesterday and showed ? Volume overload-- given IV lasix with good result (hold PO lasix for now)  Acute respiratory failure with hypoxia  -on 2-4L O2 sat is 90% -RA he was 86 -suspect will need O2 at discharge -does have COPD at baseline  Acute metabolic encephalopathy: -Likely  multifactorial etiology  -per Dr. Reesa Chew: Grandaughter is at the bedside and tells me patient has been getting more forgetful lately.  He lives at home by himself, overall his cognition has declined since his wife passed away about a year ago -I was able to speak with granddaughter who confirmed the above-- will need formal mental evaluation once medical condition stabilized -palliative care consult- unable to reach granddaughter to update but she had met with palliative care with her grandmother after her stroke -denies alcohol use so doubt withdrawal -will try tiny dose of zyprexa tonight to see if helps  Pulmonary embolism: not sure of timing but not recent -Continue Coumadin per pharmacy  Atrial Fibrillation: CHA2DS2-VASc Score is 6, needs oral anticoagulation. Patient is on Coumadin at home. INR was subtherapeutic 1.37 on admission.  -Continue metoprolol, Cardizem - Coumadin per pharmacy -d/c heparin due to hematuria and hemoptysis (has had this is past   HTN:  -Continue metoprolol, Cardizem -resume lasix  Hypokalemia/hypomagnesemia -continue to replace along with magnesium -recheck  DM type 2 with CKD stage 2 ([place]) and hypoglycemia: - Last A1c 8.9 on 08/18/16, poorly controled-- now 6 -when he came in he was hypoglycemia-- hold oral agents -SSI   Chronic systolic CHF (congestive heart failure) ([place]):  -2D echo on 08/20/2016 showed EF 35-40%.   -monitor for volume overload  Hypothyroidism: TSH mildly elevated-- will not change dose yet -Continue home Synthroid  HLD (hyperlipidemia): -lipitor  Acute on chronic kidney failure-stage II:  -monitor closely  Acute urinary retention -catheter placed on 11/20 -urine in foley has cleared w/o further hematuria today  Patient has not improved despite proper treatment, plan to change to inpatient  for further work up and management.   Family Communication/Anticipated D/C date and plan/Code Status   DVT prophylaxis:  coumadin Code Status: Full Code.  Family Communication: at bedside 11/21 Disposition Plan: pending work up/improvement   Medical Consultants:   Palliative care   Subjective:   Refusing to eat "it doesn't matter"  Objective:    Vitals:   02/09/18 0837 02/09/18 0900 02/09/18 0906 02/09/18 1158  BP:   128/79 130/89  Pulse:   66 64  Resp:   20 20  Temp:  98.3 F (36.8 C) 98.3 F (36.8 C) 98.6 F (37 C)  TempSrc:  Oral Oral Oral  SpO2: 96% 97%  98%  Weight:      Height:        Intake/Output Summary (Last 24 hours) at 02/09/2018 1337 Last data filed at 02/09/2018 1052 Gross per 24 hour  Intake 880 ml  Output 625 ml  Net 255 ml   Filed Weights   02/05/18 0505  Weight: 99.8 kg    Exam: Ill appearing, in chair, slumped down Foley with yellow urine in bag Coarse breath sounds-- patient not able to cough to clear as it sounded like upper airway No LE edema Chronic skin darkening on his arms   Data Reviewed:   I have personally reviewed following labs and imaging studies:  Labs: Labs show the following:   Basic Metabolic Panel: Recent Labs  Lab 02/05/18 0330  02/05/18 2338 02/06/18 0810 02/07/18 1340 02/07/18 1357 02/08/18 1139 02/09/18 0313  NA 140   < > 140 140 138  --  140 141  K 2.8*   < > 3.0* 3.1* 3.5  --  3.6 3.1*  CL 103   < > 104 106 106  --  108 105  CO2 27   < > '22 24 23  '$ --  23 24  GLUCOSE 71   < > 101* 87 107*  --  135* 110*  BUN 26*   < > 24* 21 27*  --  33* 31*  CREATININE 1.39*   < > 1.45* 1.13 1.39*  --  1.46* 1.57*  CALCIUM 7.9*   < > 7.9* 8.0* 8.6*  --  8.8* 8.6*  MG 1.2*  --  1.1*  --   --  1.7  --   --    < > = values in this interval not displayed.   GFR Estimated Creatinine Clearance: 45 mL/min (A) (by C-G formula based on SCr of 1.57 mg/dL (H)). Liver Function Tests: No results for input(s): AST, ALT, ALKPHOS, BILITOT, PROT, ALBUMIN in the last 168 hours. No results for input(s): LIPASE, AMYLASE in the last 168  hours. No results for input(s): AMMONIA in the last 168 hours. Coagulation profile Recent Labs  Lab 02/05/18 2338 02/06/18 0810 02/07/18 0347 02/08/18 0300 02/09/18 0313  INR 1.49 1.58 1.60 1.57 1.89    CBC: Recent Labs  Lab 02/04/18 2223 02/05/18 0330 02/05/18 2338 02/06/18 0810 02/07/18 0347 02/09/18 0313  WBC 14.0* 13.6* 15.8* 17.4* 15.6* 15.1*  NEUTROABS 10.5*  --   --   --   --   --   HGB 13.8 12.9* 13.4 13.3 13.0 13.3  HCT 41.3 41.0 39.4 40.9 40.9 40.0  MCV 93.9 95.8 93.4 94.7 95.8 94.8  PLT 250 257 238 247 236 239   Cardiac Enzymes: No results for input(s): CKTOTAL, CKMB, CKMBINDEX, TROPONINI in the last 168 hours. BNP (last 3 results) No results for input(s): PROBNP in the last 8760  hours. CBG: Recent Labs  Lab 02/08/18 1121 02/08/18 1645 02/08/18 2135 02/09/18 0607 02/09/18 1151  GLUCAP 121* 135* 102* 114* 163*   D-Dimer: No results for input(s): DDIMER in the last 72 hours. Hgb A1c: Recent Labs    02/07/18 0347  HGBA1C 6.0*   Lipid Profile: No results for input(s): CHOL, HDL, LDLCALC, TRIG, CHOLHDL, LDLDIRECT in the last 72 hours. Thyroid function studies: No results for input(s): TSH, T4TOTAL, T3FREE, THYROIDAB in the last 72 hours.  Invalid input(s): FREET3 Anemia work up: No results for input(s): VITAMINB12, FOLATE, FERRITIN, TIBC, IRON, RETICCTPCT in the last 72 hours. Sepsis Labs: Recent Labs  Lab 02/05/18 2338 02/06/18 0810 02/07/18 0347 02/09/18 0313  WBC 15.8* 17.4* 15.6* 15.1*    Microbiology Recent Results (from the past 240 hour(s))  Culture, blood (Routine X 2) w Reflex to ID Panel     Status: None (Preliminary result)   Collection Time: 02/05/18  3:30 AM  Result Value Ref Range Status   Specimen Description BLOOD LEFT ANTECUBITAL  Final   Special Requests   Final    BOTTLES DRAWN AEROBIC AND ANAEROBIC Blood Culture results may not be optimal due to an excessive volume of blood received in culture bottles   Culture    Final    NO GROWTH 3 DAYS Performed at [facility], [place] [address] [place], [place] [address]    Report Status PENDING  Incomplete  Culture, blood (Routine X 2) w Reflex to ID Panel     Status: None (Preliminary result)   Collection Time: 02/05/18  3:33 AM  Result Value Ref Range Status   Specimen Description BLOOD LEFT FOREARM  Final   Special Requests   Final    BOTTLES DRAWN AEROBIC AND ANAEROBIC Blood Culture results may not be optimal due to an excessive volume of blood received in culture bottles   Culture   Final    NO GROWTH 3 DAYS Performed at [facility], [place] [address], [place], [place] [address]    Report Status PENDING  Incomplete  Respiratory Panel by PCR     Status: Abnormal   Collection Time: 02/05/18  5:04 PM  Result Value Ref Range Status   Adenovirus NOT DETECTED NOT DETECTED Final   Coronavirus 229E NOT DETECTED NOT DETECTED Final   Coronavirus HKU1 NOT DETECTED NOT DETECTED Final   Coronavirus NL63 NOT DETECTED NOT DETECTED Final   Coronavirus OC43 NOT DETECTED NOT DETECTED Final   Metapneumovirus NOT DETECTED NOT DETECTED Final   Rhinovirus / Enterovirus DETECTED (A) NOT DETECTED Final   Influenza A NOT DETECTED NOT DETECTED Final   Influenza B NOT DETECTED NOT DETECTED Final   Parainfluenza Virus 1 NOT DETECTED NOT DETECTED Final   Parainfluenza Virus 2 NOT DETECTED NOT DETECTED Final   Parainfluenza Virus 3 NOT DETECTED NOT DETECTED Final   Parainfluenza Virus 4 NOT DETECTED NOT DETECTED Final   Respiratory Syncytial Virus NOT DETECTED NOT DETECTED Final   Bordetella pertussis NOT DETECTED NOT DETECTED Final   Chlamydophila pneumoniae NOT DETECTED NOT DETECTED Final   Mycoplasma pneumoniae NOT DETECTED NOT DETECTED Final    Comment: Performed at [facility], [place] [address], [place], [place] [address]    Procedures and diagnostic studies:  Dg Chest Port 1 View  Result Date: 02/07/2018 CLINICAL DATA:  84 year old male with fever,  productive cough, confusion. EXAM: PORTABLE CHEST 1 VIEW COMPARISON:  Chest radiographs 02/04/2018 and earlier. FINDINGS: Portable AP semi upright view  at 1438 hours. A small right pleural effusion appears increased. Stable cardiac size and mediastinal contours. Pulmonary vascularity and/or interstitial opacity appears increased. No pneumothorax. No confluent pulmonary opacity. Negative visible bowel gas pattern. No acute osseous abnormality identified. IMPRESSION: Increased pulmonary interstitial opacity and small right pleural effusion since 02/04/2018. Consider mild interstitial edema versus viral/atypical respiratory infection. Electronically Signed   By: Genevie Ann M.D.   On: 02/07/2018 15:07    Medications:   . atorvastatin  20 mg Oral q1800  . diltiazem  360 mg Oral Daily  . insulin aspart  0-5 Units Subcutaneous QHS  . insulin aspart  0-9 Units Subcutaneous TID WC  . ipratropium-albuterol  3 mL Nebulization TID  . levothyroxine  112 mcg Oral Q24H  . metoprolol tartrate  12.5 mg Oral BID  . mometasone-formoterol  2 puff Inhalation BID  . OLANZapine  2.5 mg Oral QHS  . omega-3 acid ethyl esters  1 g Oral Daily  . pantoprazole  40 mg Oral Daily  . warfarin  5 mg Oral ONCE-1800  . Warfarin - Pharmacist Dosing Inpatient   Does not apply q1800   Continuous Infusions: . cefTRIAXone (ROCEPHIN)  IV 1 g (02/08/18 1813)     LOS: 1 day   Verneita Griffes, MD [facility] 1:37 PM

## 2018-02-09 NOTE — Progress Notes (Signed)
ANTICOAGULATION CONSULT NOTE - Follow Up Consult  Pharmacy Consult for warfarin  Indication: atrial fibrillation and h/o pulmonary embolus  No Known Allergies  Patient Measurements: Height: 6\' 2"  (188 cm) Weight: 220 lb (99.8 kg) IBW/kg (Calculated) : 82.2 Heparin Dosing Weight: 99.8kg  Vital Signs: Temp: 98.3 F (36.8 C) (11/23 0906) Temp Source: Oral (11/23 0906) BP: 128/79 (11/23 0906) Pulse Rate: 66 (11/23 0906)  Labs: Recent Labs    02/07/18 0347 02/07/18 1142 02/07/18 1340 02/08/18 0300 02/08/18 1139 02/09/18 0313  HGB 13.0  --   --   --   --  13.3  HCT 40.9  --   --   --   --  40.0  PLT 236  --   --   --   --  239  LABPROT 18.9*  --   --  18.6*  --  21.4*  INR 1.60  --   --  1.57  --  1.89  HEPARINUNFRC 0.24* 0.23*  --   --   --   --   CREATININE  --   --  1.39*  --  1.46* 1.57*    Estimated Creatinine Clearance: 45 mL/min (A) (by C-G formula based on SCr of 1.57 mg/dL (H)).  Assessment: 84 yo M on Coumadin 5mg  daily exc for 7.5mg  on Tues/Thurs for h/o Afib and h/o PE (?12/24/12). INR goal 2-3. Azithromycin 5 day course completed 11/23. Also had blood in urine and bloody sputum on 11/20. Stopped heparin bridging. Missed Coumadin dose on 11/21.   INR is 1.89. Azithromycin course completed today.  CBC stable.   Goal of Therapy:  INR 2-3 Monitor platelets by anticoagulation protocol: Yes  Plan:  Give Coumadin 5mg  PO x 1 today Monitor daily INR, CBC, s/s of bleed   Harrietta Guardian, PharmD PGY1 Pharmacy Resident 02/09/2018    11:04 AM Please check AMION for all [place] numbers

## 2018-02-10 LAB — CBC WITH DIFFERENTIAL/PLATELET
Abs Immature Granulocytes: 0.19 10*3/uL — ABNORMAL HIGH (ref 0.00–0.07)
BASOS ABS: 0.1 10*3/uL (ref 0.0–0.1)
Basophils Relative: 0 %
EOS PCT: 2 %
Eosinophils Absolute: 0.2 10*3/uL (ref 0.0–0.5)
HEMATOCRIT: 38.9 % — AB (ref 39.0–52.0)
HEMOGLOBIN: 12.6 g/dL — AB (ref 13.0–17.0)
IMMATURE GRANULOCYTES: 2 %
LYMPHS ABS: 1.3 10*3/uL (ref 0.7–4.0)
LYMPHS PCT: 11 %
MCH: 31.2 pg (ref 26.0–34.0)
MCHC: 32.4 g/dL (ref 30.0–36.0)
MCV: 96.3 fL (ref 80.0–100.0)
Monocytes Absolute: 1.1 10*3/uL — ABNORMAL HIGH (ref 0.1–1.0)
Monocytes Relative: 10 %
NEUTROS PCT: 75 %
NRBC: 0 % (ref 0.0–0.2)
Neutro Abs: 8.6 10*3/uL — ABNORMAL HIGH (ref 1.7–7.7)
Platelets: 229 10*3/uL (ref 150–400)
RBC: 4.04 MIL/uL — AB (ref 4.22–5.81)
RDW: 15.2 % (ref 11.5–15.5)
WBC: 11.5 10*3/uL — AB (ref 4.0–10.5)

## 2018-02-10 LAB — RENAL FUNCTION PANEL
ALBUMIN: 2.4 g/dL — AB (ref 3.5–5.0)
ANION GAP: 6 (ref 5–15)
BUN: 30 mg/dL — AB (ref 8–23)
CALCIUM: 8.3 mg/dL — AB (ref 8.9–10.3)
CO2: 26 mmol/L (ref 22–32)
Chloride: 109 mmol/L (ref 98–111)
Creatinine, Ser: 1.25 mg/dL — ABNORMAL HIGH (ref 0.61–1.24)
GFR calc Af Amer: 60 mL/min — ABNORMAL LOW (ref >60)
GFR, EST NON AFRICAN AMERICAN: 51 mL/min — AB (ref >60)
GLUCOSE: 128 mg/dL — AB (ref 70–99)
PHOSPHORUS: 3.6 mg/dL (ref 2.5–4.6)
Potassium: 3.5 mmol/L (ref 3.5–5.1)
SODIUM: 141 mmol/L (ref 135–145)

## 2018-02-10 LAB — CULTURE, BLOOD (ROUTINE X 2)
CULTURE: NO GROWTH
Culture: NO GROWTH

## 2018-02-10 LAB — PROTIME-INR
INR: 2.41
Prothrombin Time: 25.9 seconds — ABNORMAL HIGH (ref 11.4–15.2)

## 2018-02-10 LAB — GLUCOSE, CAPILLARY
GLUCOSE-CAPILLARY: 128 mg/dL — AB (ref 70–99)
GLUCOSE-CAPILLARY: 111 mg/dL — AB (ref 70–99)
Glucose-Capillary: 218 mg/dL — ABNORMAL HIGH (ref 70–99)
Glucose-Capillary: 187 mg/dL — ABNORMAL HIGH (ref 70–99)

## 2018-02-10 MED ORDER — WARFARIN SODIUM 5 MG PO TABS
5.0000 mg | ORAL_TABLET | Freq: Once | ORAL | Status: AC
  Administered 2018-02-10: 5 mg via ORAL
  Filled 2018-02-10: qty 1

## 2018-02-10 NOTE — Progress Notes (Signed)
Noted congestion with  coarse  Bilateral  breath  on auscultation. Cough is weak HOB elevated and  Orally suctioned with CPT done. Pt a[so encouraged to cough small amount of tan-whitisn secretion came out. HR in 114 saturation 95 % on 3 L/min [place].   RRT   On call notified for  Neb treatment

## 2018-02-10 NOTE — Progress Notes (Addendum)
Wrong chart

## 2018-02-10 NOTE — Progress Notes (Signed)
ANTICOAGULATION CONSULT NOTE - Follow Up Consult  Pharmacy Consult for warfarin  Indication: atrial fibrillation and h/o pulmonary embolus  No Known Allergies  Patient Measurements: Height: 6\' 2"  (188 cm) Weight: 220 lb (99.8 kg) IBW/kg (Calculated) : 82.2 Heparin Dosing Weight: 99.8kg  Vital Signs: Temp: 97.7 F (36.5 C) (11/24 0811) Temp Source: Oral (11/24 0811) BP: 121/89 (11/24 0811) Pulse Rate: 86 (11/24 0811)  Labs: Recent Labs    02/07/18 1142  02/08/18 0300 02/08/18 1139 02/09/18 0313 02/10/18 0540  HGB  --   --   --   --  13.3 12.6*  HCT  --   --   --   --  40.0 38.9*  PLT  --   --   --   --  239 229  LABPROT  --   --  18.6*  --  21.4* 25.9*  INR  --   --  1.57  --  1.89 2.41  HEPARINUNFRC 0.23*  --   --   --   --   --   CREATININE  --    < >  --  1.46* 1.57* 1.25*   < > = values in this interval not displayed.    Estimated Creatinine Clearance: 56.5 mL/min (A) (by C-G formula based on SCr of 1.25 mg/dL (H)).  Assessment: 84 yo M on Coumadin 5mg  daily exc for 7.5mg  on Tues/Thurs for h/o Afib and h/o PE (?12/24/12). INR goal 2-3. Azithromycin 5 day course completed 11/23. Also had blood in urine and bloody sputum on 11/20. Stopped heparin bridging. Missed Coumadin dose on 11/21.    INR within therapeutic range at 2.41.  CBC stable.   Goal of Therapy:  INR 2-3 Monitor platelets by anticoagulation protocol: Yes  Plan:  Give Coumadin 5mg  PO x 1 today Monitor daily INR, CBC, s/s of bleed   Harrietta Guardian, PharmD PGY1 Pharmacy Resident 02/10/2018    10:26 AM Please check AMION for all [place] numbers

## 2018-02-10 NOTE — Progress Notes (Signed)
Pt resting with no SOB noted mouth care with oral suction done.  Oxygen saturation  (95 % on 3 L/min [place] no acute distress noted at this time.

## 2018-02-10 NOTE — Progress Notes (Signed)
[facility] PROGRESS NOTE  Justin Garner URK:[id number] DOB: 1932-04-10 DOA: 02/04/2018 PCP: [name], MD   Narrative: 84 year old male Paroxysmal A. fib on Coumadin COPD RUL has status post neoadjuvant chemoradiation lung resection 2009 9 prior history of pulmonary embolism Hyperlipidemia Type 2 diabetes mellitus on oral hypoglycemics HTN Heart failure EF 35% CKD 2  Admitted with confusion low energy temperature 99.5 low blood sugar 66 On admission WBC 14 BNP 239 INR 1.3 CXR showed cardiomegaly small right effusion  A & Plan Metabolic encephalopathy likely secondary to infectious process mild delirium-CT of the head was negative-patient's mentation is much improved and this might be related to underlying delirium-we will monitor Rhinoviral infection-likely cause for infectious process-resolving slowly Weaning oxygen-chest x-ray showed possible infection so completed Rocephin and azithromycin 11/24 Underlying COPD continue duo nebs 3 times daily, Dulera 2 puffs twice daily and albuterol every 4 as needed as needed Type 2 diabetes mellitus complicated by [id number] held-continue sliding scale coverage sugars are now in the 122 220 range Acute decompensation of chronic systolic heart failure EF 35-40%-given 1 dose of Lasix use euvolemic at this time Mild AKI-renal function is somewhat improved-monitor trends Paroxysmal atrial fibrillation on Coumadin- Prior hemoptysis-stable  DVT prophylaxis: coumadin  Code Status: n   Family Communication: called family   Disposition Plan: Theodosia Blender, MD  [facility] Direct contact: [phone] --Via amion app OR  --[email or web address]; password [email or web address]  7PM-7AM contact night coverage as above 02/10/2018, 12:50 PM  LOS: 2 days   Consultants:    Procedures:    Antimicrobials:    Interval history/Subjective: Awake much more oriented in nad eating and drinking No cp no fever Can orient to place and person No  cough no fever no chills no rigors   Objective:  Vitals:  Vitals:   02/10/18 1245 02/10/18 1248  BP: 107/67   Pulse: 90 65  Resp: 20   Temp: 97.8 F (36.6 C)   SpO2: 95%     Exam:  . eomi ncat . s1 s2 no m/r/g . cta b no added sound . abd soft nt nd no rebound no gaurding . No le edema . Neuro intact confused   I have personally reviewed the following:   Labs:  Bun/creat 31/1.57->30->1.2  WBC 15->11.5  Imaging studies:  n  Medical tests:  n   Test discussed with performing physician:  n  Decision to obtain old records:  n  Review and summation of old records:  n  Scheduled Meds: . atorvastatin  20 mg Oral q1800  . diltiazem  360 mg Oral Daily  . insulin aspart  0-5 Units Subcutaneous QHS  . insulin aspart  0-9 Units Subcutaneous TID WC  . ipratropium-albuterol  3 mL Nebulization TID  . levothyroxine  112 mcg Oral Q24H  . metoprolol tartrate  12.5 mg Oral BID  . mometasone-formoterol  2 puff Inhalation BID  . OLANZapine  2.5 mg Oral QHS  . omega-3 acid ethyl esters  1 g Oral Daily  . pantoprazole  40 mg Oral Daily  . warfarin  5 mg Oral ONCE-1800  . Warfarin - Pharmacist Dosing Inpatient   Does not apply q1800   Continuous Infusions: . cefTRIAXone (ROCEPHIN)  IV 1 g (02/09/18 1651)    Principal Problem:   Acute metabolic encephalopathy Active Problems:   Chronic atrial fibrillation   Pulmonary embolism (HCC)   Essential hypertension, benign   Hypokalemia   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 ([place])  Chronic systolic CHF (congestive heart failure) (HCC)   Hypothyroidism   HLD (hyperlipidemia)   Chronic obstructive pulmonary disease (HCC)   Leukocytosis   Acute on chronic kidney failure-II   Generalized weakness   Hypoglycemia   Acute respiratory failure with hypoxia ([place])   LOS: 2 days

## 2018-02-11 ENCOUNTER — Inpatient Hospital Stay (HOSPITAL_COMMUNITY): Payer: Medicare Other

## 2018-02-11 LAB — CBC WITH DIFFERENTIAL/PLATELET
Abs Immature Granulocytes: 0.61 10*3/uL — ABNORMAL HIGH (ref 0.00–0.07)
BASOS ABS: 0.1 10*3/uL (ref 0.0–0.1)
BASOS PCT: 1 %
EOS ABS: 0.3 10*3/uL (ref 0.0–0.5)
EOS PCT: 2 %
HCT: 44.3 % (ref 39.0–52.0)
Hemoglobin: 14.2 g/dL (ref 13.0–17.0)
Immature Granulocytes: 4 %
Lymphocytes Relative: 15 %
Lymphs Abs: 2.4 10*3/uL (ref 0.7–4.0)
MCH: 31.2 pg (ref 26.0–34.0)
MCHC: 32.1 g/dL (ref 30.0–36.0)
MCV: 97.4 fL (ref 80.0–100.0)
MONO ABS: 1.3 10*3/uL — AB (ref 0.1–1.0)
Monocytes Relative: 8 %
NRBC: 0 % (ref 0.0–0.2)
Neutro Abs: 11.7 10*3/uL — ABNORMAL HIGH (ref 1.7–7.7)
Neutrophils Relative %: 70 %
PLATELETS: 260 10*3/uL (ref 150–400)
RBC: 4.55 MIL/uL (ref 4.22–5.81)
RDW: 14.9 % (ref 11.5–15.5)
WBC: 16.3 10*3/uL — AB (ref 4.0–10.5)

## 2018-02-11 LAB — RENAL FUNCTION PANEL
ALBUMIN: 3 g/dL — AB (ref 3.5–5.0)
Anion gap: 11 (ref 5–15)
BUN: 29 mg/dL — AB (ref 8–23)
CO2: 22 mmol/L (ref 22–32)
CREATININE: 1.5 mg/dL — AB (ref 0.61–1.24)
Calcium: 8.5 mg/dL — ABNORMAL LOW (ref 8.9–10.3)
Chloride: 106 mmol/L (ref 98–111)
GFR, EST AFRICAN AMERICAN: 48 mL/min — AB (ref >60)
GFR, EST NON AFRICAN AMERICAN: 41 mL/min — AB (ref >60)
Glucose, Bld: 196 mg/dL — ABNORMAL HIGH (ref 70–99)
PHOSPHORUS: 3.4 mg/dL (ref 2.5–4.6)
POTASSIUM: 3.7 mmol/L (ref 3.5–5.1)
Sodium: 139 mmol/L (ref 135–145)

## 2018-02-11 LAB — PROTIME-INR
INR: 2.16
Prothrombin Time: 23.8 seconds — ABNORMAL HIGH (ref 11.4–15.2)

## 2018-02-11 LAB — GLUCOSE, CAPILLARY
GLUCOSE-CAPILLARY: 182 mg/dL — AB (ref 70–99)
Glucose-Capillary: 118 mg/dL — ABNORMAL HIGH (ref 70–99)

## 2018-02-11 MED ORDER — OLANZAPINE 2.5 MG PO TABS: 2.5000 mg | ORAL_TABLET | Freq: Every day | ORAL | 0 refills | Status: AC

## 2018-02-11 MED ORDER — ACETAMINOPHEN 325 MG PO TABS: 650.0000 mg | ORAL_TABLET | Freq: Four times a day (QID) | ORAL | Status: AC | PRN

## 2018-02-11 NOTE — Progress Notes (Signed)
Physical Therapy Treatment Patient Details Name: Justin Garner MRN: [id number] DOB: 1932/12/10 Today's Date: 02/11/2018    History of Present Illness Pt is an 84 y/o male admitted secondary to generalized weakness and increased confusion. Thought to have acute metabolic encephalopathy secondary to multifactorial etiology. CT negative for acute abnormality. PMH includes a fib, PE, DM, HTN, CHF, and COPD.     PT Comments    Patient's tolerance to treatment was fair today.  Patient was in bed with no visitors present upon PT arrival.  Nursing entered during session and assisted therapist with dressing patient prior to ambulation.  O2 sats were measured at 94-95% initially.  Patient required elevated bed surface to boost into standing and VC for hand placement on RW.  Patient completed 2 gait trials today.  Pt ambulated short distance from room into hallway and c/o fatigue requiring seated rest.  Pt reported SOB.  O2 sats were measured at 85% on 4L and pt was educated on pursed lip breathing.  Pt required 6L O2 to recover O2 sats and complete second gait trial.  Pt was returned to room in recliner chair on 6L O2.  O2 sats were 97% at end of treatment.  Patient would continue to benefit from acute care PT in order to address strength, mobility and activity tolerance deficits.  Patient continues to be a good candidate for SNF placement based on current functional status.   Follow Up Recommendations  Supervision/Assistance - 24 hour;SNF     Equipment Recommendations  Rolling walker with 5" wheels    Recommendations for Other Services OT consult     Precautions / Restrictions Precautions Precautions: Fall Precaution Comments: watch vitals Restrictions Weight Bearing Restrictions: No    Mobility  Bed Mobility Overal bed mobility: Needs Assistance Bed Mobility: Supine to Sit     Supine to sit: Mod assist     General bed mobility comments: Pt required VC and mod A to progress B LE  off EOB and square up.  Transfers Overall transfer level: Needs assistance Equipment used: Rolling walker (2 wheeled) Transfers: Sit to/from Stand Sit to Stand: Mod assist;From elevated surface         General transfer comment: Pt required mod A and elevation of bed surface to boost into standing.  VC required for hand placement on RW.  Pt required increased time to elevate trunk.  VC required to reach back for armrests prior to seated rest breaks.  Ambulation/Gait Ambulation/Gait assistance: Mod assist(+2 for safety) Gait Distance (Feet): 20 Feet(15 ft for second trial) Assistive device: Rolling walker (2 wheeled) Gait Pattern/deviations: Step-through pattern;Decreased step length - right;Decreased step length - left;Decreased stride length;Trunk flexed;Narrow base of support Gait velocity: decreased   General Gait Details: Pt required VC to stand upright and look forward throughout ambulation.  Pt required VC to remain within RW.   Stairs             Wheelchair Mobility    Modified Rankin (Stroke Patients Only)       Balance Overall balance assessment: Needs assistance Sitting-balance support: Bilateral upper extremity supported;Feet supported Sitting balance-Leahy Scale: Poor Sitting balance - Comments: requires B UE support on bed surface to maintain balance.   Standing balance support: Bilateral upper extremity supported;During functional activity Standing balance-Leahy Scale: Poor Standing balance comment: Reliant on BUE support and external assist  Cognition Arousal/Alertness: Awake/alert Behavior During Therapy: WFL for tasks assessed/performed Overall Cognitive Status: Impaired/Different from baseline Area of Impairment: Orientation;Attention;Memory;Safety/judgement;Awareness;Problem solving;Following commands                 Orientation Level: Disoriented to;Place;Time;Situation Current Attention Level:  Selective Memory: Decreased short-term memory Following Commands: Follows one step commands with increased time;Follows one step commands consistently Safety/Judgement: Decreased awareness of safety;Decreased awareness of deficits Awareness: Emergent Problem Solving: Slow processing;Decreased initiation;Requires verbal cues        Exercises      General Comments        Pertinent Vitals/Pain Pain Assessment: No/denies pain    Home Living                      Prior Function            PT Goals (current goals can now be found in the care plan section) Acute Rehab PT Goals Patient Stated Goal: none stated PT Goal Formulation: With patient Time For Goal Achievement: 02/19/18 Potential to Achieve Goals: Good Progress towards PT goals: Progressing toward goals    Frequency    Min 3X/week      PT Plan Discharge plan needs to be updated    Co-evaluation              AM-PAC PT "6 Clicks" Mobility   Outcome Measure  Help needed turning from your back to your side while in a flat bed without using bedrails?: A Lot Help needed moving from lying on your back to sitting on the side of a flat bed without using bedrails?: A Little Help needed moving to and from a bed to a chair (including a wheelchair)?: A Lot Help needed standing up from a chair using your arms (e.g., wheelchair or bedside chair)?: A Little Help needed to walk in hospital room?: A Lot Help needed climbing 3-5 steps with a railing? : A Lot 6 Click Score: 14    End of Session Equipment Utilized During Treatment: Gait belt Activity Tolerance: Patient limited by fatigue Patient left: in chair;with call bell/phone within reach;with chair alarm set;with nursing/sitter in room Nurse Communication: Mobility status(pt requiring 6L O2 for gait with SOB) PT Visit Diagnosis: Unsteadiness on feet (R26.81);Muscle weakness (generalized) (M62.81)     Time: 1540-0867 PT Time Calculation (min) (ACUTE  ONLY): 34 min  Charges:  $Gait Training: 8-22 mins $Therapeutic Activity: 8-22 mins                     [address] SPTA    Taryn Nave 02/11/2018, 4:14 PM

## 2018-02-11 NOTE — Progress Notes (Signed)
Pt off unit to radiology via bed accompanied by 2 transporters.

## 2018-02-11 NOTE — Clinical Social Work Placement (Signed)
Nurse to call report to [phone], Room [place]  NOTE  Date:  02/11/2018  Patient Details  Name: Justin Garner MRN: [id number] Date of Birth: 05-17-1932  Clinical Social Work is seeking post-discharge placement for this patient at the [place] level of care (*CSW will initial, date and re-position this form in  chart as items are completed):  Yes   Patient/family provided with [place] Work Department's list of facilities offering this level of care within the geographic area requested by the patient (or if unable, by the patient's family).  Yes   Patient/family informed of their freedom to choose among providers that offer the needed level of care, that participate in Medicare, Medicaid or managed care program needed by the patient, have an available bed and are willing to accept the patient.  Yes   Patient/family informed of [facility]'s ownership interest in [facility] and [facility], as well as of the fact that they are under no obligation to receive care at these facilities.  PASRR submitted to EDS on       PASRR number received on       Existing PASRR number confirmed on       FL2 transmitted to all facilities in geographic area requested by pt/family on       FL2 transmitted to all facilities within larger geographic area on       Patient informed that his/her managed care company has contracts with or will negotiate with certain facilities, including the following:        Yes   Patient/family informed of bed offers received.  Patient chooses bed at [facility]     Physician recommends and patient chooses bed at      Patient to be transferred to [facility] on 02/11/18.  Patient to be transferred to facility by [facility]     Patient family notified on 02/11/18 of transfer.  Name of family member notified:  Dianne     PHYSICIAN       Additional  Comment:    _______________________________________________ Geralynn Ochs, LCSW 02/11/2018, 12:55 PM

## 2018-02-11 NOTE — Care Management Important Message (Signed)
Important Message  Patient Details  Name: NAZAIAH NAVARRETE MRN: [id number] Date of Birth: [birth year]   Medicare Important Message Given:  Yes    Dontre Laduca 02/11/2018, 4:02 PM

## 2018-02-11 NOTE — Discharge Summary (Signed)
Physician Discharge Summary  Justin Garner [facility]:[id number] DOB: 1933-02-10 DOA: 02/04/2018  PCP: [name], MD  Admit date: 02/04/2018 Discharge date: 02/11/2018  Time spent: 25 minutes  Recommendations for Outpatient Follow-up:  1. Note addition of Zyprexa nightly for mild confusion that was seen overnight 2. Metabolic panel in about 1 week given on diuretics and ACE inhibitor 3. Recommend outpatient monitoring for mentation as well as somewhat confused earlier in admission and it was felt palliative care was needed earlier 4. 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 fib on Coumadin COPD RUL has status post neoadjuvant chemoradiation lung resection 2009 9 prior history of pulmonary embolism Hyperlipidemia Type 2 diabetes mellitus on oral hypoglycemics HTN Heart failure EF 35% CKD 2  Admitted with confusion low energy temperature 99.5 low blood sugar 66 On admission WBC 14 BNP 239 INR 1.3 CXR showed cardiomegaly small right effusion  Hospital Course:  Metabolic encephalopathy likely secondary to infectious process mild delirium-CT of the head was negative-patient's mentation is much improved and this might be related to  underlying delirium-we will monitor This is stabilized to be added small dose of Zyprexa on discharge and patient should continue same Can continue Remeron 15 at bedtime  Rhinoviral infection-likely cause for infectious process-resolving slowly-she will need oxygen-chest x-ray showed more fluid than infectious process on discharge to resume the Lasix  Underlying COPD continue duo nebs 3 times daily, Dulera 2 puffs twice daily and albuterol every 4 as needed as needed  Type 2 diabetes mellitus complicated by [id number] held-continue sliding scale coverage-blood sugars have been well controlled and 180 range-resumed glipizide on discharge  Acute decompensation of chronic systolic heart failure EF 35-40%-given 1 dose of Lasix daily but because of increasing congestion oral Lasix  Mild AKI-renal function is somewhat improved-monitor trends  Paroxysmal atrial fibrillation on Coumadin--slightly uncontrolled continue Cardizem and twice daily metoprolol-needs INR in about 1 week  Prior hemoptysis-stable  Procedures:  none  Consultations:  n  Discharge Exam: Vitals:   02/11/18 0818 02/11/18 0913  BP:    Pulse:    Resp:    Temp:    SpO2: 98% 96%    General: eomi ncat Cardiovascular: s1 s 2 afib slight tachy Respiratory: crackles and congestion to some degree sats a little low while sleeping but promptly recovers  Discharge Instructions  Allergies as of 02/11/2018   No Known Allergies     Medication List    STOP taking these medications   dextromethorphan-guaiFENesin 30-600 MG 12hr tablet Commonly known as:  Victor DM     TAKE these medications   acetaminophen 325 MG tablet Commonly known as:  TYLENOL Take 2 tablets (650 mg total) by mouth every 6 (six) hours as needed for mild  pain (or Fever >/= 101).   albuterol 108 (90 Base) MCG/ACT inhaler Commonly known as:  PROVENTIL HFA;VENTOLIN HFA Inhale 2 puffs into the lungs every 6 (six) hours as needed for wheezing  or shortness of breath.   atorvastatin 20 MG tablet Commonly known as:  LIPITOR TAKE 1 TABLET BY MOUTH  DAILY What changed:  when to take this   budesonide-formoterol 80-4.5 MCG/ACT inhaler Commonly known as:  SYMBICORT Inhale 2 puffs into the lungs every morning.   diltiazem 360 MG 24 hr capsule Commonly known as:  CARDIZEM CD Take 1 capsule (360 mg total) by mouth daily.   DRY EYES OP Place 1 drop into both eyes as needed (dry eye).   fish oil-omega-3 fatty acids 1000 MG capsule Take 1 g by mouth daily.   furosemide 20 MG tablet Commonly known as:  LASIX Take 2 tablets (40 mg total) by mouth daily.   glipiZIDE 10 MG 24 hr tablet Commonly known as:  GLUCOTROL XL Take 10 mg by mouth daily.   levothyroxine 112 MCG tablet Commonly known as:  SYNTHROID, LEVOTHROID Take 112 mcg by mouth daily.   lisinopril 5 MG tablet Commonly known as:  PRINIVIL,ZESTRIL Take 0.5 tablets (2.5 mg total) by mouth daily.   metoprolol tartrate 25 MG tablet Commonly known as:  LOPRESSOR TAKE ONE-HALF TABLET BY  MOUTH TWO TIMES DAILY What changed:  See the new instructions.   mirtazapine 15 MG tablet Commonly known as:  REMERON Take 15 mg by mouth at bedtime.   nitroGLYCERIN 0.4 MG SL tablet Commonly known as:  NITROSTAT Place 1 tablet (0.4 mg total) under the tongue every 5 (five) minutes as needed for chest pain up to 3 doses   OLANZapine 2.5 MG tablet Commonly known as:  ZYPREXA Take 1 tablet (2.5 mg total) by mouth at bedtime.   omeprazole 20 MG capsule Commonly known as:  PRILOSEC Take 20 mg by mouth daily.   sennosides-docusate sodium 8.6-50 MG tablet Commonly known as:  SENOKOT-S Take 1 tablet by mouth daily as needed for constipation.   warfarin 5 MG tablet Commonly known as:  COUMADIN Take as directed. If you are unsure how to take this medication, talk to your nurse or doctor. Original instructions:  TAKE AS DIRECTED BY  COUMADIN CLINIC What changed:  See the new  instructions.      No Known Allergies Contact information for after-discharge care    Destination    [facility] Preferred SNF .   Service:  Skilled Nursing Contact information: [place] [phone]               The results of significant diagnostics from this hospitalization (including imaging, microbiology, ancillary and laboratory) are listed below for reference.    Significant Diagnostic Studies: Dg Chest 2 View  Result Date: 02/11/2018 CLINICAL DATA:  Shortness of Breath EXAM: CHEST - 2 VIEW COMPARISON:  02/07/2018 FINDINGS: Cardiomegaly with vascular congestion and interstitial prominence which may reflect interstitial edema. Small right effusion, stable. Bibasilar atelectasis. IMPRESSION: Cardiomegaly with vascular congestion and persistent interstitial prominence, likely interstitial edema. Small right effusion. Electronically Signed   By: Rolm Baptise M.D.   On: 02/11/2018 09:02   Dg Chest 2 View  Result Date: 02/04/2018 CLINICAL DATA:  Cough for a few days EXAM: CHEST - 2 VIEW COMPARISON:  May 16, 2017 FINDINGS: Blunting of the right costophrenic angle is stable. Mild interstitial prominence is unchanged. Stable cardiomegaly. No other acute abnormalities  or changes. IMPRESSION: 1. Cardiomegaly. 2. Stable mildly prominent interstitial markings, could be due to the patient's known underlying emphysematous changes or mild chronic edema. Recommend clinical correlation. 3. Chronic blunting of the right costophrenic angle consistent with a small effusion seen on previous CT imaging. 4. No other acute abnormalities. Electronically Signed   By: Dorise Bullion III M.D   On: 02/04/2018 23:06   Ct Head Wo Contrast  Result Date: 02/05/2018 CLINICAL DATA:  Increasing memory lapses. Urinary incontinence. History of lung cancer. EXAM: CT HEAD WITHOUT CONTRAST TECHNIQUE: Contiguous axial images were obtained from  the base of the skull through the vertex without intravenous contrast. COMPARISON:  05/16/2017 FINDINGS: Brain: Diffuse cerebral atrophy. Ventricular dilatation consistent with central atrophy. Low-attenuation changes in the deep white matter consistent with small vessel ischemia. Prominent CSF space in the posterior fossa likely representing prominent cisterna magna or arachnoid cyst. No mass effect or midline shift. No abnormal extra-axial fluid collections. Gray-white matter junctions are distinct. Basal cisterns are not effaced. Vascular: Moderate intracranial arterial calcifications. Skull: Calvarium appears intact. Sinuses/Orbits: Mucosal thickening in the paranasal sinuses. No acute air-fluid levels. Mastoid air cells are clear. Other: None. IMPRESSION: No acute intracranial abnormalities. Chronic atrophy and small vessel ischemic changes. Electronically Signed   By: Lucienne Capers M.D.   On: 02/05/2018 04:35   Dg Chest Port 1 View  Result Date: 02/07/2018 CLINICAL DATA:  84 year old male with fever, productive cough, confusion. EXAM: PORTABLE CHEST 1 VIEW COMPARISON:  Chest radiographs 02/04/2018 and earlier. FINDINGS: Portable AP semi upright view at 1438 hours. A small right pleural effusion appears increased. Stable cardiac size and mediastinal contours. Pulmonary vascularity and/or interstitial opacity appears increased. No pneumothorax. No confluent pulmonary opacity. Negative visible bowel gas pattern. No acute osseous abnormality identified. IMPRESSION: Increased pulmonary interstitial opacity and small right pleural effusion since 02/04/2018. Consider mild interstitial edema versus viral/atypical respiratory infection. Electronically Signed   By: Genevie Ann M.D.   On: 02/07/2018 15:07    Microbiology: Recent Results (from the past 240 hour(s))  Culture, blood (Routine X 2) w Reflex to ID Panel     Status: None   Collection Time: 02/05/18  3:30 AM  Result Value Ref Range Status   Specimen  Description BLOOD LEFT ANTECUBITAL  Final   Special Requests   Final    BOTTLES DRAWN AEROBIC AND ANAEROBIC Blood Culture results may not be optimal due to an excessive volume of blood received in culture bottles   Culture   Final    NO GROWTH 5 DAYS Performed at [facility] [address] [place], [place] [address]    Report Status 02/10/2018 FINAL  Final  Culture, blood (Routine X 2) w Reflex to ID Panel     Status: None   Collection Time: 02/05/18  3:33 AM  Result Value Ref Range Status   Specimen Description BLOOD LEFT FOREARM  Final   Special Requests   Final    BOTTLES DRAWN AEROBIC AND ANAEROBIC Blood Culture results may not be optimal due to an excessive volume of blood received in culture bottles   Culture   Final    NO GROWTH 5 DAYS Performed at [facility], [place] [address], [place], [place] [address]    Report Status 02/10/2018 FINAL  Final  Respiratory Panel by PCR     Status: Abnormal   Collection Time: 02/05/18  5:04 PM  Result Value Ref Range Status   Adenovirus NOT DETECTED NOT DETECTED Final  Coronavirus 229E NOT DETECTED NOT DETECTED Final   Coronavirus HKU1 NOT DETECTED NOT DETECTED Final   Coronavirus NL63 NOT DETECTED NOT DETECTED Final   Coronavirus OC43 NOT DETECTED NOT DETECTED Final   Metapneumovirus NOT DETECTED NOT DETECTED Final   Rhinovirus / Enterovirus DETECTED (A) NOT DETECTED Final   Influenza A NOT DETECTED NOT DETECTED Final   Influenza B NOT DETECTED NOT DETECTED Final   Parainfluenza Virus 1 NOT DETECTED NOT DETECTED Final   Parainfluenza Virus 2 NOT DETECTED NOT DETECTED Final   Parainfluenza Virus 3 NOT DETECTED NOT DETECTED Final   Parainfluenza Virus 4 NOT DETECTED NOT DETECTED Final   Respiratory Syncytial Virus NOT DETECTED NOT DETECTED Final   Bordetella pertussis NOT DETECTED NOT DETECTED Final   Chlamydophila pneumoniae NOT DETECTED NOT DETECTED Final   Mycoplasma pneumoniae NOT DETECTED NOT DETECTED Final     Comment: Performed at [facility], [place] [address], [place], [place] [address]     Labs: Basic Metabolic Panel: Recent Labs  Lab 02/05/18 0330  02/05/18 2338  02/07/18 1340 02/07/18 1357 02/08/18 1139 02/09/18 0313 02/10/18 0540 02/11/18 0617  NA 140   < > 140   < > 138  --  140 141 141 139  K 2.8*   < > 3.0*   < > 3.5  --  3.6 3.1* 3.5 3.7  CL 103   < > 104   < > 106  --  108 105 109 106  CO2 27   < > 22   < > 23  --  23 24 26 22   GLUCOSE 71   < > 101*   < > 107*  --  135* 110* 128* 196*  BUN 26*   < > 24*   < > 27*  --  33* 31* 30* 29*  CREATININE 1.39*   < > 1.45*   < > 1.39*  --  1.46* 1.57* 1.25* 1.50*  CALCIUM 7.9*   < > 7.9*   < > 8.6*  --  8.8* 8.6* 8.3* 8.5*  MG 1.2*  --  1.1*  --   --  1.7  --   --   --   --   PHOS  --   --   --   --   --   --   --   --  3.6 3.4   < > = values in this interval not displayed.   Liver Function Tests: Recent Labs  Lab 02/10/18 0540 02/11/18 0617  ALBUMIN 2.4* 3.0*   No results for input(s): LIPASE, AMYLASE in the last 168 hours. No results for input(s): AMMONIA in the last 168 hours. CBC: Recent Labs  Lab 02/04/18 2223  02/06/18 0810 02/07/18 0347 02/09/18 0313 02/10/18 0540 02/11/18 0617  WBC 14.0*   < > 17.4* 15.6* 15.1* 11.5* 16.3*  NEUTROABS 10.5*  --   --   --   --  8.6* 11.7*  HGB 13.8   < > 13.3 13.0 13.3 12.6* 14.2  HCT 41.3   < > 40.9 40.9 40.0 38.9* 44.3  MCV 93.9   < > 94.7 95.8 94.8 96.3 97.4  PLT 250   < > 247 236 239 229 260   < > = values in this interval not displayed.   Cardiac Enzymes: No results for input(s): CKTOTAL, CKMB, CKMBINDEX, TROPONINI in the last 168 hours. BNP: BNP (last 3 results) Recent Labs    02/04/18 2224  BNP 239.7*  ProBNP (last 3 results) No results for input(s): PROBNP in the last 8760 hours.  CBG: Recent Labs  Lab 02/10/18 0625 02/10/18 1158 02/10/18 1714 02/10/18 2217 02/11/18 0601  GLUCAP 128* 218* 111* 187* 182*       Signed:  Nita Sells MD   [facility] 02/11/2018, 10:05 AM

## 2018-02-14 ENCOUNTER — Emergency Department (HOSPITAL_COMMUNITY): Payer: Medicare Other

## 2018-02-14 ENCOUNTER — Inpatient Hospital Stay (HOSPITAL_COMMUNITY)
Admission: EM | Admit: 2018-02-14 | Discharge: 2018-02-25 | DRG: 871 | Disposition: A | Discharge status: Skilled Nursing Facility | Payer: Medicare Other | Attending: Internal Medicine | Admitting: Internal Medicine

## 2018-02-14 DIAGNOSIS — J189 Pneumonia, unspecified organism: Secondary | ICD-10-CM | POA: Diagnosis present

## 2018-02-14 DIAGNOSIS — E1122 Type 2 diabetes mellitus with diabetic chronic kidney disease: Secondary | ICD-10-CM | POA: Diagnosis present

## 2018-02-14 DIAGNOSIS — J440 Chronic obstructive pulmonary disease with acute lower respiratory infection: Secondary | ICD-10-CM | POA: Diagnosis present

## 2018-02-14 DIAGNOSIS — T83021A Displacement of indwelling urethral catheter, initial encounter: Secondary | ICD-10-CM | POA: Diagnosis present

## 2018-02-14 DIAGNOSIS — A419 Sepsis, unspecified organism: Principal | ICD-10-CM | POA: Diagnosis present

## 2018-02-14 DIAGNOSIS — I509 Heart failure, unspecified: Secondary | ICD-10-CM

## 2018-02-14 DIAGNOSIS — Z515 Encounter for palliative care: Secondary | ICD-10-CM | POA: Diagnosis not present

## 2018-02-14 DIAGNOSIS — Z9221 Personal history of antineoplastic chemotherapy: Secondary | ICD-10-CM

## 2018-02-14 DIAGNOSIS — Z7901 Long term (current) use of anticoagulants: Secondary | ICD-10-CM

## 2018-02-14 DIAGNOSIS — R5381 Other malaise: Secondary | ICD-10-CM

## 2018-02-14 DIAGNOSIS — Z7984 Long term (current) use of oral hypoglycemic drugs: Secondary | ICD-10-CM

## 2018-02-14 DIAGNOSIS — I5022 Chronic systolic (congestive) heart failure: Secondary | ICD-10-CM | POA: Diagnosis present

## 2018-02-14 DIAGNOSIS — G9341 Metabolic encephalopathy: Secondary | ICD-10-CM | POA: Diagnosis present

## 2018-02-14 DIAGNOSIS — S3730XA Unspecified injury of urethra, initial encounter: Secondary | ICD-10-CM | POA: Diagnosis present

## 2018-02-14 DIAGNOSIS — I4821 Permanent atrial fibrillation: Secondary | ICD-10-CM

## 2018-02-14 DIAGNOSIS — J441 Chronic obstructive pulmonary disease with (acute) exacerbation: Secondary | ICD-10-CM

## 2018-02-14 DIAGNOSIS — K219 Gastro-esophageal reflux disease without esophagitis: Secondary | ICD-10-CM | POA: Diagnosis present

## 2018-02-14 DIAGNOSIS — J42 Unspecified chronic bronchitis: Secondary | ICD-10-CM | POA: Diagnosis not present

## 2018-02-14 DIAGNOSIS — R410 Disorientation, unspecified: Secondary | ICD-10-CM | POA: Diagnosis not present

## 2018-02-14 DIAGNOSIS — E785 Hyperlipidemia, unspecified: Secondary | ICD-10-CM | POA: Diagnosis present

## 2018-02-14 DIAGNOSIS — I10 Essential (primary) hypertension: Secondary | ICD-10-CM | POA: Diagnosis present

## 2018-02-14 DIAGNOSIS — T839XXA Unspecified complication of genitourinary prosthetic device, implant and graft, initial encounter: Secondary | ICD-10-CM

## 2018-02-14 DIAGNOSIS — I130 Hypertensive heart and chronic kidney disease with heart failure and stage 1 through stage 4 chronic kidney disease, or unspecified chronic kidney disease: Secondary | ICD-10-CM | POA: Diagnosis present

## 2018-02-14 DIAGNOSIS — Z923 Personal history of irradiation: Secondary | ICD-10-CM

## 2018-02-14 DIAGNOSIS — R0600 Dyspnea, unspecified: Secondary | ICD-10-CM

## 2018-02-14 DIAGNOSIS — N390 Urinary tract infection, site not specified: Secondary | ICD-10-CM

## 2018-02-14 DIAGNOSIS — F0391 Unspecified dementia with behavioral disturbance: Secondary | ICD-10-CM | POA: Diagnosis present

## 2018-02-14 DIAGNOSIS — E119 Type 2 diabetes mellitus without complications: Secondary | ICD-10-CM

## 2018-02-14 DIAGNOSIS — J9312 Secondary spontaneous pneumothorax: Secondary | ICD-10-CM | POA: Diagnosis not present

## 2018-02-14 DIAGNOSIS — R338 Other retention of urine: Secondary | ICD-10-CM | POA: Diagnosis present

## 2018-02-14 DIAGNOSIS — R0902 Hypoxemia: Secondary | ICD-10-CM

## 2018-02-14 DIAGNOSIS — R339 Retention of urine, unspecified: Secondary | ICD-10-CM

## 2018-02-14 DIAGNOSIS — Z66 Do not resuscitate: Secondary | ICD-10-CM | POA: Diagnosis present

## 2018-02-14 DIAGNOSIS — N183 Chronic kidney disease, stage 3 unspecified: Secondary | ICD-10-CM

## 2018-02-14 DIAGNOSIS — J449 Chronic obstructive pulmonary disease, unspecified: Secondary | ICD-10-CM

## 2018-02-14 DIAGNOSIS — Z79899 Other long term (current) drug therapy: Secondary | ICD-10-CM

## 2018-02-14 DIAGNOSIS — Z87891 Personal history of nicotine dependence: Secondary | ICD-10-CM

## 2018-02-14 DIAGNOSIS — I2510 Atherosclerotic heart disease of native coronary artery without angina pectoris: Secondary | ICD-10-CM | POA: Diagnosis present

## 2018-02-14 DIAGNOSIS — E039 Hypothyroidism, unspecified: Secondary | ICD-10-CM | POA: Diagnosis present

## 2018-02-14 DIAGNOSIS — N179 Acute kidney failure, unspecified: Secondary | ICD-10-CM | POA: Diagnosis present

## 2018-02-14 DIAGNOSIS — Z86711 Personal history of pulmonary embolism: Secondary | ICD-10-CM

## 2018-02-14 DIAGNOSIS — Z9981 Dependence on supplemental oxygen: Secondary | ICD-10-CM

## 2018-02-14 DIAGNOSIS — I4891 Unspecified atrial fibrillation: Secondary | ICD-10-CM | POA: Diagnosis not present

## 2018-02-14 DIAGNOSIS — R9431 Abnormal electrocardiogram [ECG] [EKG]: Secondary | ICD-10-CM

## 2018-02-14 DIAGNOSIS — Z7951 Long term (current) use of inhaled steroids: Secondary | ICD-10-CM

## 2018-02-14 DIAGNOSIS — Y95 Nosocomial condition: Secondary | ICD-10-CM | POA: Diagnosis present

## 2018-02-14 DIAGNOSIS — Z902 Acquired absence of lung [part of]: Secondary | ICD-10-CM

## 2018-02-14 DIAGNOSIS — Z7189 Other specified counseling: Secondary | ICD-10-CM | POA: Diagnosis not present

## 2018-02-14 DIAGNOSIS — R310 Gross hematuria: Secondary | ICD-10-CM | POA: Diagnosis present

## 2018-02-14 DIAGNOSIS — J9601 Acute respiratory failure with hypoxia: Secondary | ICD-10-CM | POA: Diagnosis present

## 2018-02-14 DIAGNOSIS — F0390 Unspecified dementia without behavioral disturbance: Secondary | ICD-10-CM

## 2018-02-14 DIAGNOSIS — Z85118 Personal history of other malignant neoplasm of bronchus and lung: Secondary | ICD-10-CM

## 2018-02-14 DIAGNOSIS — I482 Chronic atrial fibrillation, unspecified: Secondary | ICD-10-CM | POA: Diagnosis present

## 2018-02-14 DIAGNOSIS — R791 Abnormal coagulation profile: Secondary | ICD-10-CM

## 2018-02-14 DIAGNOSIS — Z79890 Hormone replacement therapy (postmenopausal): Secondary | ICD-10-CM

## 2018-02-14 DIAGNOSIS — J9383 Other pneumothorax: Secondary | ICD-10-CM

## 2018-02-14 DIAGNOSIS — N401 Enlarged prostate with lower urinary tract symptoms: Secondary | ICD-10-CM | POA: Diagnosis present

## 2018-02-14 DIAGNOSIS — J939 Pneumothorax, unspecified: Secondary | ICD-10-CM

## 2018-02-14 LAB — PROTIME-INR
INR: 4.88
PROTHROMBIN TIME: 44.7 s — AB (ref 11.4–15.2)

## 2018-02-14 LAB — CBC WITH DIFFERENTIAL/PLATELET
Abs Immature Granulocytes: 0.72 10*3/uL — ABNORMAL HIGH (ref 0.00–0.07)
Basophils Absolute: 0.1 10*3/uL (ref 0.0–0.1)
Basophils Relative: 0 %
Eosinophils Absolute: 0.2 10*3/uL (ref 0.0–0.5)
Eosinophils Relative: 1 %
HCT: 44.4 % (ref 39.0–52.0)
Hemoglobin: 14.4 g/dL (ref 13.0–17.0)
Immature Granulocytes: 2 %
Lymphocytes Relative: 4 %
Lymphs Abs: 1.4 10*3/uL (ref 0.7–4.0)
MCH: 31.9 pg (ref 26.0–34.0)
MCHC: 32.4 g/dL (ref 30.0–36.0)
MCV: 98.2 fL (ref 80.0–100.0)
Monocytes Absolute: 1.2 10*3/uL — ABNORMAL HIGH (ref 0.1–1.0)
Monocytes Relative: 4 %
Neutro Abs: 28.3 10*3/uL — ABNORMAL HIGH (ref 1.7–7.7)
Neutrophils Relative %: 89 %
Platelets: 325 10*3/uL (ref 150–400)
RBC: 4.52 MIL/uL (ref 4.22–5.81)
RDW: 15.1 % (ref 11.5–15.5)
WBC: 31.9 10*3/uL — ABNORMAL HIGH (ref 4.0–10.5)
nRBC: 0 % (ref 0.0–0.2)

## 2018-02-14 LAB — URINALYSIS, ROUTINE W REFLEX MICROSCOPIC
Bilirubin Urine: NEGATIVE
Glucose, UA: NEGATIVE mg/dL
Ketones, ur: NEGATIVE mg/dL
Nitrite: NEGATIVE
PROTEIN: 30 mg/dL — AB
RBC / HPF: >50 RBC/hpf — ABNORMAL HIGH (ref 0–5)
Specific Gravity, Urine: 1.013 (ref 1.005–1.030)
WBC, UA: >50 WBC/hpf — ABNORMAL HIGH (ref 0–5)
pH: 5 (ref 5.0–8.0)

## 2018-02-14 LAB — BASIC METABOLIC PANEL
ANION GAP: 11 (ref 5–15)
BUN: 74 mg/dL — ABNORMAL HIGH (ref 8–23)
CO2: 24 mmol/L (ref 22–32)
Calcium: 8.1 mg/dL — ABNORMAL LOW (ref 8.9–10.3)
Chloride: 105 mmol/L (ref 98–111)
Creatinine, Ser: 4.5 mg/dL — ABNORMAL HIGH (ref 0.61–1.24)
GFR calc Af Amer: 13 mL/min — ABNORMAL LOW (ref >60)
GFR calc non Af Amer: 11 mL/min — ABNORMAL LOW (ref >60)
Glucose, Bld: 66 mg/dL — ABNORMAL LOW (ref 70–99)
Potassium: 3.8 mmol/L (ref 3.5–5.1)
Sodium: 140 mmol/L (ref 135–145)

## 2018-02-14 LAB — I-STAT TROPONIN, ED: Troponin i, poc: 0.01 ng/mL (ref 0.00–0.08)

## 2018-02-14 LAB — I-STAT CG4 LACTIC ACID, ED
Lactic Acid, Venous: 1.23 mmol/L (ref 0.5–1.9)
Lactic Acid, Venous: 1.52 mmol/L (ref 0.5–1.9)

## 2018-02-14 LAB — BRAIN NATRIURETIC PEPTIDE: B Natriuretic Peptide: 148.9 pg/mL — ABNORMAL HIGH (ref 0.0–100.0)

## 2018-02-14 MED ORDER — METHYLPREDNISOLONE SODIUM SUCC 125 MG IJ SOLR
125.0000 mg | Freq: Once | INTRAMUSCULAR | Status: AC
  Administered 2018-02-14: 125 mg via INTRAVENOUS
  Filled 2018-02-14: qty 2

## 2018-02-14 MED ORDER — INSULIN ASPART 100 UNIT/ML ~~LOC~~ SOLN
0.0000 [IU] | Freq: Three times a day (TID) | SUBCUTANEOUS | Status: DC
  Administered 2018-02-15: 3 [IU] via SUBCUTANEOUS
  Administered 2018-02-15 – 2018-02-16 (×3): 2 [IU] via SUBCUTANEOUS
  Administered 2018-02-16: 3 [IU] via SUBCUTANEOUS
  Administered 2018-02-17: 1 [IU] via SUBCUTANEOUS
  Administered 2018-02-18: 2 [IU] via SUBCUTANEOUS
  Administered 2018-02-19: 1 [IU] via SUBCUTANEOUS
  Administered 2018-02-19: 2 [IU] via SUBCUTANEOUS
  Administered 2018-02-20: 3 [IU] via SUBCUTANEOUS
  Administered 2018-02-20 – 2018-02-21 (×2): 1 [IU] via SUBCUTANEOUS
  Administered 2018-02-21 – 2018-02-22 (×3): 2 [IU] via SUBCUTANEOUS
  Administered 2018-02-23: 3 [IU] via SUBCUTANEOUS
  Administered 2018-02-24: 7 [IU] via SUBCUTANEOUS
  Administered 2018-02-24: 2 [IU] via SUBCUTANEOUS
  Administered 2018-02-24: 5 [IU] via SUBCUTANEOUS
  Administered 2018-02-25: 2 [IU] via SUBCUTANEOUS
  Administered 2018-02-25: 3 [IU] via SUBCUTANEOUS

## 2018-02-14 MED ORDER — SODIUM CHLORIDE 0.9 % IV SOLN
INTRAVENOUS | Status: DC
  Administered 2018-02-15: 01:00:00 via INTRAVENOUS

## 2018-02-14 MED ORDER — LEVOTHYROXINE SODIUM 100 MCG IV SOLR
56.0000 ug | Freq: Every day | INTRAVENOUS | Status: DC
  Administered 2018-02-15 – 2018-02-16 (×2): 56 ug via INTRAVENOUS
  Filled 2018-02-14 (×3): qty 5

## 2018-02-14 MED ORDER — ACETAMINOPHEN 325 MG PO TABS: 650.0000 mg | ORAL_TABLET | Freq: Four times a day (QID) | ORAL | Status: DC | PRN

## 2018-02-14 MED ORDER — POTASSIUM CHLORIDE 10 MEQ/100ML IV SOLN: 10.0000 meq | INTRAVENOUS | Status: AC

## 2018-02-14 MED ORDER — BISACODYL 10 MG RE SUPP: 10.0000 mg | Freq: Every day | RECTAL | Status: DC | PRN

## 2018-02-14 MED ORDER — OLANZAPINE 10 MG IM SOLR: 2.5000 mg | Freq: Every evening | INTRAMUSCULAR | Status: DC | PRN

## 2018-02-14 MED ORDER — DILTIAZEM HCL-DEXTROSE 100-5 MG/100ML-% IV SOLN (PREMIX): 5.0000 mg/h | INTRAVENOUS | Status: DC

## 2018-02-14 MED ORDER — ACETAMINOPHEN 650 MG RE SUPP: 650.0000 mg | Freq: Four times a day (QID) | RECTAL | Status: DC | PRN

## 2018-02-14 MED ORDER — IPRATROPIUM-ALBUTEROL 0.5-2.5 (3) MG/3ML IN SOLN
3.0000 mL | Freq: Once | RESPIRATORY_TRACT | Status: AC
  Administered 2018-02-14: 3 mL via RESPIRATORY_TRACT
  Filled 2018-02-14: qty 3

## 2018-02-14 MED ORDER — MOMETASONE FURO-FORMOTEROL FUM 100-5 MCG/ACT IN AERO
2.0000 | INHALATION_SPRAY | Freq: Two times a day (BID) | RESPIRATORY_TRACT | Status: DC
  Administered 2018-02-15 – 2018-02-25 (×18): 2 via RESPIRATORY_TRACT
  Filled 2018-02-14: qty 8.8

## 2018-02-14 MED ORDER — SODIUM CHLORIDE 0.9 % IV SOLN
1.0000 g | Freq: Once | INTRAVENOUS | Status: AC
  Administered 2018-02-14: 1 g via INTRAVENOUS
  Filled 2018-02-14: qty 1

## 2018-02-14 MED ORDER — IPRATROPIUM-ALBUTEROL 0.5-2.5 (3) MG/3ML IN SOLN: 3.0000 mL | Freq: Four times a day (QID) | RESPIRATORY_TRACT | Status: DC | PRN

## 2018-02-14 MED ORDER — VANCOMYCIN HCL 10 G IV SOLR
2000.0000 mg | INTRAVENOUS | Status: AC
  Administered 2018-02-14: 2000 mg via INTRAVENOUS
  Filled 2018-02-14: qty 2000

## 2018-02-14 NOTE — ED Triage Notes (Signed)
Pt BIB [facility] from [facility]. Facility stated that the pt had decreased urine output since 0700. They flushed his cath and since then he has only passed 75cc since that time and is bloody. Pt is confused at baseline and staff stated that he is at baseline currently. Pt currently on duoneb hx of lung CA.

## 2018-02-14 NOTE — H&P (Addendum)
History and Physical    Justin Garner:[id number] DOB: [birth year] DOA: 02/14/2018  PCP: [name], MD Patient coming from: Nursing home  Chief Complaint: Shortness of breath, blood in Foley catheter  HPI: Justin Garner is a 84 y.o. male with medical history significant of CAD, CKD stage III, COPD, chronic systolic CHF, type 2 diabetes, GERD, hypertension, hyperlipidemia, paroxysmal A. fib on Coumadin, history of lung cancer status post right upper lobe lobectomy in 2009 prior PE presenting to the hospital for evaluation of shortness of breath and blood in Foley catheter. Patient was recently admitted from February 04, 2018 to February 11, 2018 for shortness of breath, confusion, and rhinovirus infection.  He was discharged home on 2 L oxygen via nasal cannula.  Patient does have an indwelling Foley catheter which has bloody output now.  The Foley was placed on November 19 on 3 W per report from ED nurses.  Per EMS, patient had low urine output this morning which caused nursing home to change the Foley.  Since then, he has urinated 75 cc of bloody urine.  He is on Coumadin due to history of PE and A. fib.  Patient has also been short of breath.  EMS said patient was on 4 L oxygen when they picked him up. Patient was somnolent and no history could be obtained from him.  He knows his name, knows that he is at a hospital, and knows that the year is 2019.  Denies having any pain.  ED Course: Per conversation with ED provider, patient's Foley was found to be in the wrong position.  The bulb was at the base of the penis. Existing Foley was removed in the ED and a new Foley was placed in the ED.  1800 cc of urine returned immediately. Patient thought to have AKI likely due to obstruction. Afebrile.  White count 31.9.  Lactic acid normal.  UA showing a large amount of hemoglobin, negative nitrite, small amount of leukocytes, greater than 50 RBCs per high-power field, greater than 50 white blood  cells per high-power field, and rare bacteria.  INR 4.88.  BUN 74, creatinine 4.5.  Creatinine was 1.5 three days ago.  I-STAT troponin negative and EKG not suggestive of ACS. Chest x-ray showing bullous emphysema at the left lung apex with suggestion of new tiny apical left pneumothorax. No mediastinal shift. New left retrocardiac consolidation, which could represent atelectasis, aspiration or pneumonia. Chest CT pending Patient received Solu-Medrol 125 mg and DuoNeb treatment in the ED.  In addition, started on vancomycin and cefepime.  TRH paged to admit.  Review of Systems: As per HPI otherwise 10 point review of systems negative.  Past Medical History:  Diagnosis Date  . ASCVD (arteriosclerotic cardiovascular disease)   . Cancer [facility]) 2009   Upper lobectomy;radiation; chemotherapy  . Chronic systolic CHF (congestive heart failure) ([place])   . CKD (chronic kidney disease), stage II   . COPD (chronic obstructive pulmonary disease) ([place])   . Coronary artery disease   . Diabetes mellitus   . GERD (gastroesophageal reflux disease)   . Hyperlipidemia   . Hypertension   . Leukocytosis 07/12/2015  . PAF (paroxysmal atrial fibrillation) (HCC)    Chronic warfarin therapy  . Primary cancer of right upper lobe of lung ([place]) 06/30/2013   RUL lobectomy 2009 with chest wall resection for superior sulcus tumor Postoperative radiation and chemotherapy; Dr Earlie Server Oncology monitor [phone]  . Pulmonary embolism (HCC)    PMH of  . Thyroid  disease     Past Surgical History:  Procedure Laterality Date  . CAROTID STENT    . CHOLECYSTECTOMY    . FIBEROPTIC BRONCHOSCOPY AND MEIASTINOSCOPY  06/07/2007  . HERNIA REPAIR    . INGUINAL HERNIA REPAIR  10/10/2010  . LEFT HEART CATHETERIZATION WITH CORONARY ANGIOGRAM N/A 08/19/2013   Procedure: LEFT HEART CATHETERIZATION WITH CORONARY ANGIOGRAM;  Surgeon: Jettie Booze, MD;  Location: [facility] CATH LAB;  Service: Cardiovascular;  Laterality: N/A;  . R  VATS,THORACOTOMY AND UPPER LOBECTOMY  08/02/2007     reports that he quit smoking about 16 years ago. His smoking use included cigarettes. He has a 50.00 pack-year smoking history. He has quit using smokeless tobacco. He reports that he does not drink alcohol or use drugs.  No Known Allergies  Family History  Problem Relation Age of Onset  . Heart disease Father        Heart attack  . Heart attack Father   . Diabetes Brother   . Cancer Neg Hx   . Stroke Neg Hx   . Hypertension Neg Hx     Prior to Admission medications   Medication Sig Start Date End Date Taking? Authorizing Provider  acetaminophen (TYLENOL) 325 MG tablet Take 2 tablets (650 mg total) by mouth every 6 (six) hours as needed for mild pain (or Fever >/= 101). 02/11/18  Yes Nita Sells, MD  albuterol (PROVENTIL HFA;VENTOLIN HFA) 108 (90 Base) MCG/ACT inhaler Inhale 2 puffs into the lungs every 6 (six) hours as needed for wheezing or shortness of breath. 11/16/15  Yes Mikhail, [place], DO  Artificial Tear Ointment (DRY EYES OP) Place 1 drop into both eyes as needed (dry eye).   Yes [provider]  atorvastatin (LIPITOR) 20 MG tablet TAKE 1 TABLET BY MOUTH  DAILY Patient taking differently: Take 20 mg by mouth daily at 6 PM.  05/09/17  Yes Jettie Booze, MD  budesonide-formoterol [facility]) 80-4.5 MCG/ACT inhaler Inhale 2 puffs into the lungs every morning. 11/16/15  Yes Mikhail, Velta Addison, DO  diltiazem (CARDIZEM CD) 360 MG 24 hr capsule Take 1 capsule (360 mg total) by mouth daily. 09/10/17  Yes Jettie Booze, MD  fish oil-omega-3 fatty acids 1000 MG capsule Take 1 g by mouth daily.    Yes [provider]  furosemide (LASIX) 20 MG tablet Take 2 tablets (40 mg total) by mouth daily. 06/27/17  Yes Simmons, Brittainy M, PA-C  glipiZIDE (GLUCOTROL XL) 10 MG 24 hr tablet Take 10 mg by mouth daily. 11/17/16  Yes [provider]  levothyroxine (SYNTHROID, LEVOTHROID) 112 MCG tablet Take  112 mcg by mouth daily.     Yes [provider]  lisinopril (PRINIVIL,ZESTRIL) 5 MG tablet Take 0.5 tablets (2.5 mg total) by mouth daily. 01/03/17  Yes Jettie Booze, MD  metoprolol tartrate (LOPRESSOR) 25 MG tablet TAKE ONE-HALF TABLET BY  MOUTH TWO TIMES DAILY Patient taking differently: Take 12.5 mg by mouth 2 (two) times daily.  05/09/17  Yes Jettie Booze, MD  mirtazapine (REMERON) 15 MG tablet Take 15 mg by mouth at bedtime.   Yes [provider]  nitroGLYCERIN (NITROSTAT) 0.4 MG SL tablet Place 1 tablet (0.4 mg total) under the tongue every 5 (five) minutes as needed for chest pain up to 3 doses 08/03/16  Yes Jettie Booze, MD  OLANZapine (ZYPREXA) 2.5 MG tablet Take 1 tablet (2.5 mg total) by mouth at bedtime. 02/11/18  Yes Nita Sells, MD  omeprazole (PRILOSEC) 20 MG  capsule Take 20 mg by mouth daily.    Yes [provider]  sennosides-docusate sodium (SENOKOT-S) 8.6-50 MG tablet Take 1 tablet by mouth daily as needed for constipation.    Yes [provider]  warfarin (COUMADIN) 5 MG tablet TAKE AS DIRECTED BY  COUMADIN CLINIC Patient taking differently: Take 5-7.5 mg by mouth See admin instructions. Take 1 and 1/2 tablets on Tuesday and Thursday then take 1 tablet all the other days 12/03/17  Yes Jettie Booze, MD    Physical Exam: Vitals:   02/14/18 2330 02/14/18 2345 02/15/18 0015 02/15/18 0113  BP: 112/67 (!) 91/58 103/68 (!) 90/58  Pulse: 79 85 (!) 105 (!) 55  Resp: (!) 24 (!) 23 (!) 23 (!) 21  Temp:      TempSrc:      SpO2: 93% 94% 96% 98%    Physical Exam  HENT:  Head: Normocephalic.  Dry mucous membranes  Eyes: Right eye exhibits no discharge. Left eye exhibits no discharge.  Neck: Neck supple.  Cardiovascular: Intact distal pulses.  Tachycardic, irregularly irregular rhythm  Pulmonary/Chest: He has no wheezes.  Slightly increased work of breathing Decreased breath sounds at the left upper  lobe Diffuse rhonchi Gurgling upper airway sounds  Abdominal: Soft. Bowel sounds are normal. He exhibits no distension. There is no tenderness. There is no guarding.  Genitourinary:  Genitourinary Comments: Foley catheter in place  Musculoskeletal:  +1 pedal edema  Neurological:  Somnolent He knows his name, knows that he is at a hospital, and knows that the year is 2019.   Skin: Skin is warm and dry. He is not diaphoretic.     Labs on Admission: I have personally reviewed following labs and imaging studies  CBC: Recent Labs  Lab 02/09/18 0313 02/10/18 0540 02/11/18 0617 02/14/18 2107  WBC 15.1* 11.5* 16.3* 31.9*  NEUTROABS  --  8.6* 11.7* 28.3*  HGB 13.3 12.6* 14.2 14.4  HCT 40.0 38.9* 44.3 44.4  MCV 94.8 96.3 97.4 98.2  PLT 239 229 260 826   Basic Metabolic Panel: Recent Labs  Lab 02/08/18 1139 02/09/18 0313 02/10/18 0540 02/11/18 0617 02/14/18 2107  NA 140 141 141 139 140  K 3.6 3.1* 3.5 3.7 3.8  CL 108 105 109 106 105  CO2 23 24 26 22 24   GLUCOSE 135* 110* 128* 196* 66*  BUN 33* 31* 30* 29* 74*  CREATININE 1.46* 1.57* 1.25* 1.50* 4.50*  CALCIUM 8.8* 8.6* 8.3* 8.5* 8.1*  PHOS  --   --  3.6 3.4  --    GFR: Estimated Creatinine Clearance: 15.7 mL/min (A) (by C-G formula based on SCr of 4.5 mg/dL (H)). Liver Function Tests: Recent Labs  Lab 02/10/18 0540 02/11/18 0617  ALBUMIN 2.4* 3.0*   No results for input(s): LIPASE, AMYLASE in the last 168 hours. No results for input(s): AMMONIA in the last 168 hours. Coagulation Profile: Recent Labs  Lab 02/08/18 0300 02/09/18 0313 02/10/18 0540 02/11/18 0617 02/14/18 2107  INR 1.57 1.89 2.41 2.16 4.88*   Cardiac Enzymes: No results for input(s): CKTOTAL, CKMB, CKMBINDEX, TROPONINI in the last 168 hours. BNP (last 3 results) No results for input(s): PROBNP in the last 8760 hours. HbA1C: No results for input(s): HGBA1C in the last 72 hours. CBG: Recent Labs  Lab 02/10/18 1158 02/10/18 1714  02/10/18 2217 02/11/18 0601 02/11/18 1110  GLUCAP 218* 111* 187* 182* 118*   Lipid Profile: No results for input(s): CHOL, HDL, LDLCALC, TRIG, CHOLHDL, LDLDIRECT in the last 72  hours. Thyroid Function Tests: No results for input(s): TSH, T4TOTAL, FREET4, T3FREE, THYROIDAB in the last 72 hours. Anemia Panel: No results for input(s): VITAMINB12, FOLATE, FERRITIN, TIBC, IRON, RETICCTPCT in the last 72 hours. Urine analysis:    Component Value Date/Time   COLORURINE YELLOW 02/14/2018 2211   APPEARANCEUR HAZY (A) 02/14/2018 2211   LABSPEC 1.013 02/14/2018 2211   PHURINE 5.0 02/14/2018 2211   GLUCOSEU NEGATIVE 02/14/2018 2211   HGBUR LARGE (A) 02/14/2018 2211   BILIRUBINUR NEGATIVE 02/14/2018 2211   KETONESUR NEGATIVE 02/14/2018 2211   PROTEINUR 30 (A) 02/14/2018 2211   UROBILINOGEN 4.0 (H) 12/27/2013 2225   NITRITE NEGATIVE 02/14/2018 2211   LEUKOCYTESUR SMALL (A) 02/14/2018 2211    Radiological Exams on Admission: Ct Chest Wo Contrast  Result Date: 02/14/2018 CLINICAL DATA:  Shortness of breath. Question pneumothorax on radiograph. EXAM: CT CHEST WITHOUT CONTRAST TECHNIQUE: Multidetector CT imaging of the chest was performed following the standard protocol without IV contrast. COMPARISON:  Radiograph earlier this day. Chest CT 04/26/2017 FINDINGS: Cardiovascular: Aortic atherosclerosis without aneurysm. Multi chamber cardiomegaly. There are coronary artery calcifications. Small amount of loculated pericardial fluid as before. Mediastinum/Nodes: Shotty mediastinal lymph nodes, for example left anterior carinal node measures 10 mm short axis. Limited assessment for hilar adenopathy in the absence of IV contrast. Surgical clips at the right hilum. No esophageal wall thickening. Lungs/Pleura: Advanced bullous emphysema, with large bulla in the left lung apex and right middle lobe. Left pneumothorax with small apical component, and more prominent anteromedial and inferior component.  Pneumothorax likely roughly 10%. Left lower lobe consolidation may be atelectasis or pneumonia. Patchy and ground-glass opacities in the right lower lobe. Small right pleural effusion. Upper Abdomen: Motion artifacts. No definite acute finding. Musculoskeletal: Exaggerated thoracic kyphosis. Bones are under mineralized. There are no acute or suspicious osseous abnormalities. IMPRESSION: 1. CT confirms left pneumothorax, roughly 10%. Greatest pneumothorax component inferior and anteromedially. 2. Left lower lobe consolidation, suspicious for pneumonia. Patchy opacities at the right lung base suggest infectious etiology. 3. Advanced bullous emphysema with large bulla in the left lung apex and right middle lobe. Patchy and ground-glass opacities in the right lower lobe may be infectious or inflammatory. 4. Small bilateral pleural effusions. 5. Advanced bullous emphysema. 6. Coronary artery calcifications. Aortic Atherosclerosis (ICD10-I70.0) and Emphysema (ICD10-J43.9). These results were called by telephone at the time of interpretation on 02/14/2018 at 11:13 pm to Dr. Isla Pence , who verbally acknowledged these results. Electronically Signed   By: Keith Rake M.D.   On: 02/14/2018 23:13   US Renal  Result Date: 02/15/2018 CLINICAL DATA:  Initial evaluation for acute urinary retention. EXAM: RENAL / URINARY TRACT ULTRASOUND COMPLETE COMPARISON:  None available. FINDINGS: Right Kidney: Renal measurements: 11.9 x 4.7 x 6.5 cm = volume: 190.4 mL . Echogenicity within normal limits. No mass or hydronephrosis visualized. Left Kidney: Renal measurements: 8.9 x 4.3 x 5.1 cm = volume: 11.3 mL. Left kidney demonstrates a somewhat lobulated contour with mildly heterogeneous echotexture. No hydronephrosis. 2.7 x 2.3 x 1.9 cm simple cyst present at the upper pole. Bladder: Foley catheter in place within the bladder. Bladder itself is mild to moderately distended and not decompressed. Enlarged prostate measuring 5.7  x 4.9 x 6.1 cm, with volume of 87.6 cc IMPRESSION: 1. Persistent mild-to-moderate bladder distension with Foley catheter in place. Finding raises the possibility for clogging or malpositioning of the Foley. Bedside check recommended. 2. Enlarged prostate. 3. No hydronephrosis. 4. 2.7 cm simple left renal cyst. Electronically  Signed   By: Jeannine Boga M.D.   On: 02/15/2018 01:12   Dg Chest Port 1 View  Result Date: 02/14/2018 CLINICAL DATA:  Dyspnea, history of right upper lobectomy for lung cancer EXAM: PORTABLE CHEST 1 VIEW COMPARISON:  02/11/2018 chest radiograph. FINDINGS: Stable cardiomediastinal silhouette with mild cardiomegaly. Stable postsurgical changes from partial posterior right third rib resection and right upper lobectomy. No right pneumothorax. Stable pleural-parenchymal scarring at right costophrenic angle. Bullous emphysema at the left lung apex with suggestion of new tiny apical left pneumothorax. No pulmonary edema. New left retrocardiac consolidation. IMPRESSION: 1. Bullous emphysema at the left lung apex with suggestion of new tiny apical left pneumothorax. No mediastinal shift. 2. New left retrocardiac consolidation, which could represent atelectasis, aspiration or pneumonia. 3. Stable postsurgical changes in the right hemithorax from right upper lobectomy. Critical Value/emergent results were called by telephone at the time of interpretation on 02/14/2018 at 9:27 pm to Dr. Isla Pence , who verbally acknowledged these results. Electronically Signed   By: Ilona Sorrel M.D.   On: 02/14/2018 21:29    EKG: Independently reviewed.  Atrial fibrillation (heart rate 103), QTC 501.  Right bundle branch block, similar to prior tracing.  Assessment/Plan Principal Problem:   Acute respiratory failure with hypoxia (HCC) Active Problems:   Pneumothorax   Essential hypertension, benign   UTI (urinary tract infection)   Chronic systolic CHF (congestive heart failure) (HCC)    Hypothyroidism   HLD (hyperlipidemia)   CAD in native artery   Supratherapeutic INR   COPD (chronic obstructive pulmonary disease) (HCC)   Atrial fibrillation with RVR (HCC)   Acute metabolic encephalopathy   AKI (acute kidney injury) ([place])   HCAP (healthcare-associated pneumonia)   CKD (chronic kidney disease) stage 3, GFR 30-59 ml/min (HCC)   QT prolongation   Type 2 diabetes mellitus ([place])   Dementia (HCC)   Physical deconditioning  Acute hypoxic respiratory failure secondary to suspected HCAP, small pneumothorax Patient was discharged home during his previous hospitalization on 2 L supplemental oxygen.  He is currently requiring 4 L oxygen.  Noted to have slightly increased work of breathing, rhonchi, and gurgling upper airway sounds on exam.  No wheezing appreciated.  He is afebrile. White count 31.9.  Lactic acid normal.  ABG showing pH 7.41, PCO2 32, and PO2 72.  Bicarb 24 on labs.  Chest x-ray showing bullous emphysema at the left lung apex with suggestion of new tiny apical left pneumothorax; no mediastinal shift. New left retrocardiac consolidation. Chest CT showing left pneumothorax, roughly 10%. Left lower lobe consolidation, suspicious for pneumonia. Patchy opacities at the right lung base suggestive of infectious etiology. Advanced bullous emphysema with large bulla in the left lung apex and right middle lobe. Patchy and ground-glass opacities in the right lower lobe thought to be infectious or inflammatory. Small bilateral pleural effusions. -High aspiration risk: Aspiration precautions, keep n.p.o., oral suctioning -Supplemental oxygen -Not a candidate for NIPPV due to increased oral secretions -Continue broad-spectrum antibiotics (vancomycin and cefepime) for HCAP coverage -Patient has already received Solu-Medrol 125 mg. Continue management for COPD: Dulera twice daily, duonebs every 6 hours -Respiratory viral panel -Blood culture x2 pending -Monitor in the stepdown unit -I  discussed the case with Dr. Jimmy Footman from critical care medicine who then discussed the case with her colleague Dr. Claudie Leach.  They both agree that the pneumothorax is not big enough to require acute intervention at this time.  Recommendation is to continue supplemental oxygen and repeat chest x-ray  in a few hours.  If the repeat chest x-ray reveals progression/ increase in size of the pneumothorax, consult interventional radiology for placement of a pigtail catheter.  Patient has been placed on the PCCM consult list. -Repeat chest x-ray at 3 AM  AKI on CKD 3, hematuria, obstructive urinary retention  AKI seems to be post renal due to obstruction. BUN 74, creatinine 4.5.  Creatinine was 1.5 three days ago. Foley catheter was inserted during his previous hospitalization.  Patient presented from his nursing home for evaluation of decreased urine output and hematuria.  Hemoglobin stable on admission at 14.4. UA showing a large amount of hemoglobin and >50 RBCs per high-power field.  Hematuria likely traumatic as foley found to be in the wrong position with the bulb at the base of the penis.  In addition, patient is on Coumadin due to history of PE and A. fib and found to have supratherapeutic INR which could also be contributing.  Foley was removed and another Foley was placed in the ED which drained 1800 cc urine immediately.  Renal ultrasound done subsequently showing persistent mild to moderate bladder distention with Foley catheter in place.  Showing enlarged prostate and no hydronephrosis.  I reevaluated the Foley with nursing staff at bedside.  No kinks noted.  Foley was flushed and no blood clots noted.  Nursing staff informed me that patient has had an additional 900 cc output in his Foley bag.  Urine in the bag appears dark in color.  I discussed the case with Dr. Diona Fanti from urology.  He believes since the patient has had adequate urine output, his Foley seems to be draining well.  He is not  recommending bladder scans at this time.  Recommended continuing to monitor urine output.  He will see the patient in the morning. -Continue Foley catheter -Continue gentle IV fluid hydration. Will hold if Foley stops draining. -Strict intake and output -Hold home Coumadin -Continue to monitor urine output -Continue to monitor renal function; BMP in a.m. -Repeat CBC in a.m. to monitor hemoglobin -Avoid nephrotoxic agents/contrast.  Hold home Lasix, lisinopril.  Suspected UTI UA with evidence of pyuria - small amount of leukocytes and >50 white blood cells per high-power field, and rare bacteria.  -Continue broad-spectrum antibiotic coverage (vancomycin and cefepime) as mentioned above -Urine culture pending -Blood culture x2 pending  Supratherapeutic INR in the setting of home Coumadin use for A. fib and history of PE INR 4.88.  Patient is presenting with traumatic hematuria. -Hold Coumadin at this time -Continue monitor PT/INR  A. Fib with RVR CHA2DS2Vasc 6. Currently in A. fib with heart rate in the low 100s. -Hold home p.o. medications as patient is currently n.p.o.  IV Cardizem for rate control. Hold if SBP <110 or HR <105.  -Hold Coumadin in the setting of traumatic hematuria and supratherapeutic INR -Monitor on telemetry  QTC prolongation on EKG QTC 501. -Continue monitor electrolytes and replete PRN.  Keep potassium >4 and magnesium >2.  -Monitor on telemetry -Avoid QT prolonging drugs  Acute metabolic encephalopathy -Likely secondary to HCAP, UTI, AKI as mentioned above   COPD Currently not wheezing on exam.  -Dulera twice daily -DuoNeb's every 6 hours  Chronic systolic congestive heart failure -Has +1 pedal edema but overall appears dry on exam.  He has small bilateral pleural effusions but no overt pulmonary edema on imaging.  BNP not impressive. -Continue gentle IV fluid hydration for AKI -Hold home p.o. beta-blocker and calcium channel blocker as patient is  n.p.o. -Continue IV Cardizem for A. fib rate control as mentioned above -Hold home Lasix, lisinopril in the setting of AKI  CAD -Hold home p.o. beta-blocker and calcium channel blocker as patient is n.p.o. -Continue IV Cardizem for A. fib rate control as mentioned above -Hold home statin as patient is n.p.o. -Hold home lisinopril in the setting of AKI  Hypertension -Continue IV Cardizem for A. fib rate control as mentioned above -Hold home Lasix, lisinopril in the setting of AKI  Hyperlipidemia -Hold home statin as patient is n.p.o.  Well-controlled type 2 diabetes -A1c 6.0 on February 07, 2018 -Sliding scale insulin sensitive -CBG checks  Dementia with history of behavioral disturbance Patient was started on low-dose Zyprexa during his previous hospitalization. -Will hold Zyprexa at this time in the setting of QT prolongation -Delirium precautions  Hypothyroidism -Continue Synthroid in IV form  Physical deconditioning -PT evaluation  DVT prophylaxis: SCDs Code Status: DNR.  Signed DNR form present at bedside. Family Communication: No family available Disposition Plan: Dissipate discharge to nursing home in 2 to 3 days. Consults called: None Admission status: It is my clinical opinion that admission to INPATIENT is reasonable and necessary in this 84 y.o. male . presenting with symptoms of dyspnea, hematuria, decreased urine output, concerning for acute hypoxic respiratory failure secondary to pneumothorax and suspected HCAP, postobstructive AKI on CKD 3 . in the context of PMH including: Recent hospitalization for rhinovirus infection, CKD 3 . with pertinent positives on physical exam including: Increased work of breathing, supplemental oxygen requirement, Foley catheter requirement for urine retention . and pertinent positives on radiographic and laboratory data including: Leukocytosis, worsening renal function, UA suggestive of possible infection, evidence of pneumonia  and pneumothorax on imaging . Workup and treatment include IV fluid hydration, IV broad-spectrum antibiotics, Foley catheter for urinary retention.  Management listed above.  Given the aforementioned, the predictability of an adverse outcome is felt to be significant. I expect that the patient will require at least 2 midnights in the hospital to treat this condition.   Shela Leff MD [facility] Pager [phone]  If 7PM-7AM, please contact night-coverage [email or web address] Password [email or web address]  02/15/2018, 1:27 AM

## 2018-02-14 NOTE — ED Notes (Signed)
Bladder scan noted 447mL.

## 2018-02-14 NOTE — ED Notes (Signed)
Bed: UN27 Expected date:  Expected time:  Means of arrival:  Comments: 84 yr old hematuria in catheter

## 2018-02-14 NOTE — Progress Notes (Signed)
A consult was received from an ED physician for Vancomycin per pharmacy dosing.  The patient's profile has been reviewed for ht/wt/allergies/indication/available labs.    A one time order has been placed for Vancomycin 2gm IV (20 mg/kg).  Further antibiotics/pharmacy consults should be ordered by admitting physician if indicated.                       Thank you, Everette Rank, PharmD 02/14/2018  9:32 PM

## 2018-02-14 NOTE — ED Notes (Signed)
Attempted to remove foley and the balloon would not deflate. The catheter was cut and would still not drain the balloon. MD Havalin was notified and we were able to deflate the balloon and remove the foley. The foley was in the urethra and the pressure would not release the fluid from the balloon. Foley was placed on 3W MC on 02/05/2018.  New Foley was placed and 1800cc drained from the pts bladder. Color was a reddish brown.

## 2018-02-14 NOTE — ED Provider Notes (Signed)
Justin Garner Provider Note   CSN: [id number] Arrival date & time: 02/14/18  2057     History   Chief Complaint Chief Complaint  Patient presents with  . Shortness of Breath    HPI Justin Garner is a 84 y.o. male.  Pt presents to the ED today with sob and blood in foley catheter.  The pt was admitted from 11/18-25 with sob, confusion, and rhinovirus infection.  He ws d/c home on oxygen via [place] at 2L.  Pt does have an indwelling foley which is bloody now.  The pt is unsure when the foley was placed.   However, the ED nurses said it was placed on 11/19 on 3W.  EMS told the nurse that he had "low" urine output this morning which caused them to change the foley.  Since then, he has only urinated 75 cc of bloody urine. The pt is on coumadin due to hx of PE and afib.  The pt has also been sob.  EMS said he was on 4L when they picked him up.  The pt denies any pain.  CHA2DS2/VAS Stroke Risk Points  Current as of 10 minutes ago     6 >= 2 Points: High Risk  1 - 1.99 Points: Medium Risk  0 Points: Low Risk    This is the only CHA2DS2/VAS Stroke Risk Points available for the past  year.:  Last Change: N/A     Details    This score determines the patient's risk of having a stroke if the  patient has atrial fibrillation.       Points Metrics  1 Has Congestive Heart Failure:  Yes    Current as of 10 minutes ago  1 Has Vascular Disease:  Yes    Current as of 10 minutes ago  1 Has Hypertension:  Yes    Current as of 10 minutes ago  2 Age:  70    Current as of 10 minutes ago  1 Has Diabetes:  Yes    Current as of 10 minutes ago  0 Had Stroke:  No  Had TIA:  No  Had thromboembolism:  No    Current as of 10 minutes ago  0 Male:  No    Current as of 10 minutes ago                 Past Medical History:  Diagnosis Date  . ASCVD (arteriosclerotic cardiovascular disease)   . Cancer [facility]) 2009   Upper lobectomy;radiation; chemotherapy  .  Chronic systolic CHF (congestive heart failure) ([place])   . CKD (chronic kidney disease), stage II   . COPD (chronic obstructive pulmonary disease) ([place])   . Coronary artery disease   . Diabetes mellitus   . GERD (gastroesophageal reflux disease)   . Hyperlipidemia   . Hypertension   . Leukocytosis 07/12/2015  . PAF (paroxysmal atrial fibrillation) (HCC)    Chronic warfarin therapy  . Primary cancer of right upper lobe of lung ([place]) 06/30/2013   RUL lobectomy 2009 with chest wall resection for superior sulcus tumor Postoperative radiation and chemotherapy; Dr Earlie Server Oncology monitor [phone]  . Pulmonary embolism (H[facility])    PMH of  . Thyroid disease     Patient Active Problem List   Diagnosis Date Noted  . Acute respiratory failure with hypoxia ([place]) 02/08/2018  . Generalized weakness 02/05/2018  . Hypoglycemia 02/05/2018  . Hemoptysis 04/26/2017  . Lobar pneumonia ([place]) 12/20/2016  . Atrial fibrillation  with RVR ([place]) 06/09/2016  . Acute metabolic encephalopathy 48/54/6270  . COPD exacerbation ([place]) 11/15/2015  . Acute on chronic kidney failure-II 11/15/2015  . Malnutrition of moderate degree 11/15/2015  . Leukocytosis 07/12/2015  . Chronic obstructive pulmonary disease ([place])   . Cardiomyopathy ([place])   . CAD in native artery   . Hypomagnesemia   . CAP (community acquired pneumonia) 06/13/2015  . Acute on chronic respiratory failure with hypoxia ([place]) 06/13/2015  . Hypothyroidism 06/13/2015  . HLD (hyperlipidemia) 06/13/2015  . Chronic systolic CHF (congestive heart failure) ([place])   . Protein-calorie malnutrition, severe ([place]) 12/29/2013  . Hypokalemia 12/28/2013  . Sepsis ([place]) 12/28/2013  . 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 Acute systolic heart failure ([place]) 08/12/2013  . Primary cancer of right upper lobe of lung ([place]) 06/30/2013  . Coronary atherosclerosis of native coronary artery 05/28/2013  . Essential hypertension, benign 05/28/2013  . Chronic  atrial fibrillation 12/24/2012  . Pulmonary embolism ([place]) 12/24/2012  . COPD with acute bronchitis ([place]) 04/23/2008    Past Surgical History:  Procedure Laterality Date  . CAROTID STENT    . CHOLECYSTECTOMY    . FIBEROPTIC BRONCHOSCOPY AND MEIASTINOSCOPY  06/07/2007  . HERNIA REPAIR    . INGUINAL HERNIA REPAIR  10/10/2010  . LEFT HEART CATHETERIZATION WITH CORONARY ANGIOGRAM N/A 08/19/2013   Procedure: LEFT HEART CATHETERIZATION WITH CORONARY ANGIOGRAM;  Surgeon: Jettie Booze, MD;  Location: [facility] CATH LAB;  Service: Cardiovascular;  Laterality: N/A;  . R VATS,THORACOTOMY AND UPPER LOBECTOMY  08/02/2007        Home Medications    Prior to Admission medications   Medication Sig Start Date End Date Taking? Authorizing Provider  acetaminophen (TYLENOL) 325 MG tablet Take 2 tablets (650 mg total) by mouth every 6 (six) hours as needed for mild pain (or Fever >/= 101). 02/11/18  Yes Nita Sells, MD  albuterol (PROVENTIL HFA;VENTOLIN HFA) 108 (90 Base) MCG/ACT inhaler Inhale 2 puffs into the lungs every 6 (six) hours as needed for wheezing or shortness of breath. 11/16/15  Yes Mikhail, [place], DO  Artificial Tear Ointment (DRY EYES OP) Place 1 drop into both eyes as needed (dry eye).   Yes [provider]  atorvastatin (LIPITOR) 20 MG tablet TAKE 1 TABLET BY MOUTH  DAILY Patient taking differently: Take 20 mg by mouth daily at 6 PM.  05/09/17  Yes Jettie Booze, MD  budesonide-formoterol [facility]) 80-4.5 MCG/ACT inhaler Inhale 2 puffs into the lungs every morning. 11/16/15  Yes Mikhail, Velta [place], DO  diltiazem (CARDIZEM CD) 360 MG 24 hr capsule Take 1 capsule (360 mg total) by mouth daily. 09/10/17  Yes Jettie Booze, MD  fish oil-omega-3 fatty acids 1000 MG capsule Take 1 g by mouth daily.    Yes [provider]  furosemide (LASIX) 20 MG tablet Take 2 tablets (40 mg total) by mouth daily. 06/27/17  Yes Simmons, Brittainy M, PA-C  glipiZIDE  (GLUCOTROL) 10 MG tablet Take 10 mg by mouth daily before breakfast.   Yes [provider]  levothyroxine (SYNTHROID, LEVOTHROID) 112 MCG tablet Take 112 mcg by mouth daily.     Yes [provider]  lisinopril (PRINIVIL,ZESTRIL) 5 MG tablet Take 0.5 tablets (2.5 mg total) by mouth daily. 01/03/17  Yes Jettie Booze, MD  metoprolol tartrate (LOPRESSOR) 25 MG tablet TAKE ONE-HALF TABLET BY  MOUTH TWO TIMES DAILY Patient taking differently: Take 12.5 mg by mouth 2 (two) times daily.  05/09/17  Yes  Jettie Booze, MD  mirtazapine (REMERON) 15 MG tablet Take 15 mg by mouth at bedtime.   Yes [provider]  nitroGLYCERIN (NITROSTAT) 0.4 MG SL tablet Place 1 tablet (0.4 mg total) under the tongue every 5 (five) minutes as needed for chest pain up to 3 doses 08/03/16  Yes Jettie Booze, MD  OLANZapine (ZYPREXA) 2.5 MG tablet Take 1 tablet (2.5 mg total) by mouth at bedtime. 02/11/18  Yes Nita Sells, MD  omeprazole (PRILOSEC) 20 MG capsule Take 20 mg by mouth daily.    Yes [provider]  sennosides-docusate sodium (SENOKOT-S) 8.6-50 MG tablet Take 1 tablet by mouth daily as needed for constipation.    Yes [provider]  warfarin (COUMADIN) 5 MG tablet TAKE AS DIRECTED BY  COUMADIN CLINIC Patient taking differently: Take 5 mg by mouth daily.  12/03/17  Yes Jettie Booze, MD    Family History Family History  Problem Relation Age of Onset  . Heart disease Father        Heart attack  . Heart attack Father   . Diabetes Brother   . Cancer Neg Hx   . Stroke Neg Hx   . Hypertension Neg Hx     Social History Social History   Tobacco Use  . Smoking status: Former Smoker    Packs/day: 1.00    Years: 50.00    Pack years: 50.00    Types: Cigarettes    Last attempt to quit: 03/20/2001    Years since quitting: 16.9  . Smokeless tobacco: Former Network engineer Use Topics  . Alcohol use: No  . Drug use: No      Allergies   Patient has no known allergies.   Review of Systems Review of Systems  Respiratory: Positive for cough and shortness of breath.   Genitourinary: Positive for hematuria.  All other systems reviewed and are negative.    Physical Exam Updated Vital Signs BP 105/68   Pulse 75   Temp 97.6 F (36.4 C) (Oral)   Resp (!) 34   SpO2 99% Comment: Neb  Physical Exam  Constitutional: He is oriented to person, place, and time. He appears ill. He appears distressed.  HENT:  Head: Normocephalic and atraumatic.  Mouth/Throat: Oropharynx is clear and moist.  Eyes: Pupils are equal, round, and reactive to light. EOM are normal.  Neck: Normal range of motion. Neck supple.  Cardiovascular: An irregularly irregular rhythm present. Tachycardia present.  Pulmonary/Chest: Tachypnea noted. He has rhonchi.  Abdominal: Soft. Bowel sounds are normal. There is tenderness in the suprapubic area.  Genitourinary:  Genitourinary Comments: Foley does not appear to be in the correct location.  I can feel the bulb at the base of his penis.  The pt does not have any abnormalities to his penis other than that.  Musculoskeletal: Normal range of motion.       Right lower leg: Normal.       Left lower leg: Normal.  Neurological: He is alert and oriented to person, place, and time.  Skin: Skin is warm. Capillary refill takes less than 2 seconds.  Psychiatric: He has a normal mood and affect. His behavior is normal.  Nursing note and vitals reviewed.    ED Treatments / Results  Labs (all labs ordered are listed, but only abnormal results are displayed) Labs Reviewed  BASIC METABOLIC PANEL - Abnormal; Notable for the following components:      Result Value   Glucose, Bld 66 (*)  BUN 74 (*)    Creatinine, Ser 4.50 (*)    Calcium 8.1 (*)    GFR calc non Af Amer 11 (*)    GFR calc Af Amer 13 (*)    All other components within normal limits  BRAIN NATRIURETIC PEPTIDE - Abnormal; Notable  for the following components:   B Natriuretic Peptide 148.9 (*)    All other components within normal limits  PROTIME-INR - Abnormal; Notable for the following components:   Prothrombin Time 44.7 (*)    INR 4.88 (*)    All other components within normal limits  URINALYSIS, ROUTINE W REFLEX MICROSCOPIC - Abnormal; Notable for the following components:   APPearance HAZY (*)    Hgb urine dipstick LARGE (*)    Protein, ur 30 (*)    Leukocytes, UA SMALL (*)    RBC / HPF >50 (*)    WBC, UA >50 (*)    Bacteria, UA RARE (*)    All other components within normal limits  CBC WITH DIFFERENTIAL/PLATELET - Abnormal; Notable for the following components:   WBC 31.9 (*)    Neutro Abs 28.3 (*)    Monocytes Absolute 1.2 (*)    Abs Immature Granulocytes 0.72 (*)    All other components within normal limits  URINE CULTURE  CULTURE, BLOOD (ROUTINE X 2)  CULTURE, BLOOD (ROUTINE X 2)  I-STAT TROPONIN, ED  I-STAT CG4 LACTIC ACID, ED  I-STAT CG4 LACTIC ACID, ED    EKG EKG Interpretation  Date/Time:  Thursday February 14 2018 21:22:27 EST Ventricular Rate:  103 PR Interval:    QRS Duration: 143 QT Interval:  451 QTC Calculation: 501 R Axis:   49 Text Interpretation:  Atrial fibrillation Right bundle branch block No significant change since last tracing Confirmed by Isla Pence [id number]) on 02/14/2018 9:29:24 PM   Radiology Ct Chest Wo Contrast  Result Date: 02/14/2018 CLINICAL DATA:  Shortness of breath. Question pneumothorax on radiograph. EXAM: CT CHEST WITHOUT CONTRAST TECHNIQUE: Multidetector CT imaging of the chest was performed following the standard protocol without IV contrast. COMPARISON:  Radiograph earlier this day. Chest CT 04/26/2017 FINDINGS: Cardiovascular: Aortic atherosclerosis without aneurysm. Multi chamber cardiomegaly. There are coronary artery calcifications. Small amount of loculated pericardial fluid as before. Mediastinum/Nodes: Shotty mediastinal lymph nodes, for  example left anterior carinal node measures 10 mm short axis. Limited assessment for hilar adenopathy in the absence of IV contrast. Surgical clips at the right hilum. No esophageal wall thickening. Lungs/Pleura: Advanced bullous emphysema, with large bulla in the left lung apex and right middle lobe. Left pneumothorax with small apical component, and more prominent anteromedial and inferior component. Pneumothorax likely roughly 10%. Left lower lobe consolidation may be atelectasis or pneumonia. Patchy and ground-glass opacities in the right lower lobe. Small right pleural effusion. Upper Abdomen: Motion artifacts. No definite acute finding. Musculoskeletal: Exaggerated thoracic kyphosis. Bones are under mineralized. There are no acute or suspicious osseous abnormalities. IMPRESSION: 1. CT confirms left pneumothorax, roughly 10%. Greatest pneumothorax component inferior and anteromedially. 2. Left lower lobe consolidation, suspicious for pneumonia. Patchy opacities at the right lung base suggest infectious etiology. 3. Advanced bullous emphysema with large bulla in the left lung apex and right middle lobe. Patchy and ground-glass opacities in the right lower lobe may be infectious or inflammatory. 4. Small bilateral pleural effusions. 5. Advanced bullous emphysema. 6. Coronary artery calcifications. Aortic Atherosclerosis (ICD10-I70.0) and Emphysema (ICD10-J43.9). These results were called by telephone at the time of interpretation on 02/14/2018 at 11:13  pm to Dr. Isla Pence , who verbally acknowledged these results. Electronically Signed   By: Keith Rake M.D.   On: 02/14/2018 23:13   Dg Chest Port 1 View  Result Date: 02/14/2018 CLINICAL DATA:  Dyspnea, history of right upper lobectomy for lung cancer EXAM: PORTABLE CHEST 1 VIEW COMPARISON:  02/11/2018 chest radiograph. FINDINGS: Stable cardiomediastinal silhouette with mild cardiomegaly. Stable postsurgical changes from partial posterior right  third rib resection and right upper lobectomy. No right pneumothorax. Stable pleural-parenchymal scarring at right costophrenic angle. Bullous emphysema at the left lung apex with suggestion of new tiny apical left pneumothorax. No pulmonary edema. New left retrocardiac consolidation. IMPRESSION: 1. Bullous emphysema at the left lung apex with suggestion of new tiny apical left pneumothorax. No mediastinal shift. 2. New left retrocardiac consolidation, which could represent atelectasis, aspiration or pneumonia. 3. Stable postsurgical changes in the right hemithorax from right upper lobectomy. Critical Value/emergent results were called by telephone at the time of interpretation on 02/14/2018 at 9:27 pm to Dr. Isla Pence , who verbally acknowledged these results. Electronically Signed   By: Ilona Sorrel M.D.   On: 02/14/2018 21:29    Procedures Procedures (including critical care time)  Medications Ordered in ED Medications  vancomycin (VANCOCIN) 2,000 mg in sodium chloride 0.9 % 500 mL IVPB ( Intravenous Rate/Dose Verify 02/14/18 2316)  ipratropium-albuterol (DUONEB) 0.5-2.5 (3) MG/3ML nebulizer solution 3 mL (3 mLs Nebulization Given 02/14/18 2142)  methylPREDNISolone sodium succinate (SOLU-MEDROL) 125 mg/2 mL injection 125 mg (125 mg Intravenous Given 02/14/18 2142)  ceFEPIme (MAXIPIME) 1 g in sodium chloride 0.9 % 100 mL IVPB ( Intravenous Rate/Dose Change 02/14/18 2308)     Initial Impression / Assessment and Plan / ED Course  I have reviewed the triage vital signs and the nursing notes.  Pertinent labs & imaging results that were available during my care of the patient were reviewed by me and considered in my medical decision making (see chart for details).     Foley removed and a new foley placed.  1800 L of urine returned immediately, foley clamped.  Pt has AKI likely due to obstruction which is now relieved.  Urine is pink, but is draining well now.    The pt does have a small  pneumothorax (10%).  He does have bilateral pneumonia, and I think his sob is due to pna and copd rather than the small ptx.  I will treat that for now with oxygen and observation.  The pt is treated for HCAP with Maxipime and vancomycin.  His copd was treated with nebs and steroids.  Pt's urine sent for cx.  Blood sent for culture.  Pt d/w Dr. Marlowe Sax (triad) for admission.  CRITICAL CARE Performed by: Isla Pence   Total critical care time: 45 minutes  Critical care time was exclusive of separately billable procedures and treating other patients.  Critical care was necessary to treat or prevent imminent or life-threatening deterioration.  Critical care was time spent personally by me on the following activities: development of treatment plan with patient and/or surrogate as well as nursing, discussions with consultants, evaluation of patient's response to treatment, examination of patient, obtaining history from patient or surrogate, ordering and performing treatments and interventions, ordering and review of laboratory studies, ordering and review of radiographic studies, pulse oximetry and re-evaluation of patient's condition.  Final Clinical Impressions(s) / ED Diagnoses   Final diagnoses:  Urinary retention  Foley catheter problem, initial encounter ([place])  AKI (acute kidney injury) ([place])  COPD exacerbation ([place])  HCAP (healthcare-associated pneumonia)  Permanent atrial fibrillation  Anticoagulated on Coumadin  Spontaneous pneumothorax    ED Discharge Orders    None       Isla Pence, MD 02/14/18 2321

## 2018-02-15 ENCOUNTER — Inpatient Hospital Stay (HOSPITAL_COMMUNITY): Payer: Medicare Other

## 2018-02-15 ENCOUNTER — Encounter (HOSPITAL_COMMUNITY): Payer: Self-pay

## 2018-02-15 ENCOUNTER — Other Ambulatory Visit: Payer: Self-pay

## 2018-02-15 DIAGNOSIS — R5381 Other malaise: Secondary | ICD-10-CM

## 2018-02-15 DIAGNOSIS — N183 Chronic kidney disease, stage 3 unspecified: Secondary | ICD-10-CM

## 2018-02-15 DIAGNOSIS — I4891 Unspecified atrial fibrillation: Secondary | ICD-10-CM

## 2018-02-15 DIAGNOSIS — F0390 Unspecified dementia without behavioral disturbance: Secondary | ICD-10-CM

## 2018-02-15 DIAGNOSIS — J9601 Acute respiratory failure with hypoxia: Secondary | ICD-10-CM

## 2018-02-15 DIAGNOSIS — R9431 Abnormal electrocardiogram [ECG] [EKG]: Secondary | ICD-10-CM

## 2018-02-15 DIAGNOSIS — G9341 Metabolic encephalopathy: Secondary | ICD-10-CM

## 2018-02-15 DIAGNOSIS — E119 Type 2 diabetes mellitus without complications: Secondary | ICD-10-CM

## 2018-02-15 DIAGNOSIS — J189 Pneumonia, unspecified organism: Secondary | ICD-10-CM

## 2018-02-15 DIAGNOSIS — I5022 Chronic systolic (congestive) heart failure: Secondary | ICD-10-CM

## 2018-02-15 DIAGNOSIS — N179 Acute kidney failure, unspecified: Secondary | ICD-10-CM

## 2018-02-15 LAB — RESPIRATORY PANEL BY PCR

## 2018-02-15 LAB — BLOOD GAS, ARTERIAL
Acid-base deficit: 2.5 mmol/L — ABNORMAL HIGH (ref 0.0–2.0)
Bicarbonate: 20.9 mmol/L (ref 20.0–28.0)
Drawn by: 225631
O2 Content: 4 L/min
O2 SAT: 94 %
PATIENT TEMPERATURE: 97.6
PO2 ART: 72.2 mmHg — AB (ref 83.0–108.0)
RATE: 20 resp/min
pCO2 arterial: 32.7 mmHg (ref 32.0–48.0)
pH, Arterial: 7.419 (ref 7.350–7.450)

## 2018-02-15 LAB — CBC
HCT: 42.8 % (ref 39.0–52.0)
HEMOGLOBIN: 13.6 g/dL (ref 13.0–17.0)
MCH: 31.7 pg (ref 26.0–34.0)
MCHC: 31.8 g/dL (ref 30.0–36.0)
MCV: 99.8 fL (ref 80.0–100.0)
Platelets: 353 10*3/uL (ref 150–400)
RBC: 4.29 MIL/uL (ref 4.22–5.81)
RDW: 15.2 % (ref 11.5–15.5)
WBC: 33.5 10*3/uL — ABNORMAL HIGH (ref 4.0–10.5)
nRBC: 0 % (ref 0.0–0.2)

## 2018-02-15 LAB — MAGNESIUM: Magnesium: 1.8 mg/dL (ref 1.7–2.4)

## 2018-02-15 LAB — BASIC METABOLIC PANEL
ANION GAP: 18 — AB (ref 5–15)
BUN: 66 mg/dL — ABNORMAL HIGH (ref 8–23)
CO2: 17 mmol/L — ABNORMAL LOW (ref 22–32)
Calcium: 7.9 mg/dL — ABNORMAL LOW (ref 8.9–10.3)
Chloride: 105 mmol/L (ref 98–111)
Creatinine, Ser: 3.86 mg/dL — ABNORMAL HIGH (ref 0.61–1.24)
GFR calc Af Amer: 16 mL/min — ABNORMAL LOW (ref >60)
GFR calc non Af Amer: 14 mL/min — ABNORMAL LOW (ref >60)
Glucose, Bld: 114 mg/dL — ABNORMAL HIGH (ref 70–99)
Potassium: 3.8 mmol/L (ref 3.5–5.1)
Sodium: 140 mmol/L (ref 135–145)

## 2018-02-15 LAB — PROTIME-INR
INR: 4.93
Prothrombin Time: 45.2 seconds — ABNORMAL HIGH (ref 11.4–15.2)

## 2018-02-15 LAB — GLUCOSE, CAPILLARY
Glucose-Capillary: 193 mg/dL — ABNORMAL HIGH (ref 70–99)
Glucose-Capillary: 209 mg/dL — ABNORMAL HIGH (ref 70–99)
Glucose-Capillary: 157 mg/dL — ABNORMAL HIGH (ref 70–99)
Glucose-Capillary: 168 mg/dL — ABNORMAL HIGH (ref 70–99)

## 2018-02-15 LAB — MRSA PCR SCREENING: MRSA by PCR: NEGATIVE

## 2018-02-15 MED ORDER — ALBUTEROL SULFATE (2.5 MG/3ML) 0.083% IN NEBU: 2.5000 mg | INHALATION_SOLUTION | RESPIRATORY_TRACT | Status: DC | PRN

## 2018-02-15 MED ORDER — SODIUM CHLORIDE 0.9 % IV SOLN
INTRAVENOUS | Status: AC
  Administered 2018-02-15: 02:00:00 via INTRAVENOUS

## 2018-02-15 MED ORDER — SODIUM CHLORIDE 0.9 % IV SOLN
INTRAVENOUS | Status: AC
  Administered 2018-02-15: 16:00:00 via INTRAVENOUS

## 2018-02-15 MED ORDER — SODIUM CHLORIDE 0.9 % IV SOLN
1.0000 g | INTRAVENOUS | Status: DC
  Administered 2018-02-15: 1 g via INTRAVENOUS
  Filled 2018-02-15: qty 1

## 2018-02-15 MED ORDER — IPRATROPIUM-ALBUTEROL 0.5-2.5 (3) MG/3ML IN SOLN
3.0000 mL | Freq: Four times a day (QID) | RESPIRATORY_TRACT | Status: DC
  Administered 2018-02-15 (×4): 3 mL via RESPIRATORY_TRACT
  Filled 2018-02-15 (×4): qty 3

## 2018-02-15 MED ORDER — VANCOMYCIN VARIABLE DOSE PER UNSTABLE RENAL FUNCTION (PHARMACIST DOSING): Status: DC

## 2018-02-15 MED ORDER — ORAL CARE MOUTH RINSE
15.0000 mL | Freq: Two times a day (BID) | OROMUCOSAL | Status: DC
  Administered 2018-02-15 – 2018-02-25 (×17): 15 mL via OROMUCOSAL

## 2018-02-15 MED ORDER — METOPROLOL TARTRATE 25 MG PO TABS
12.5000 mg | ORAL_TABLET | Freq: Two times a day (BID) | ORAL | Status: DC
  Administered 2018-02-15 – 2018-02-25 (×17): 12.5 mg via ORAL
  Filled 2018-02-15 (×20): qty 1

## 2018-02-15 MED ORDER — DILTIAZEM HCL 60 MG PO TABS
60.0000 mg | ORAL_TABLET | Freq: Four times a day (QID) | ORAL | Status: AC
  Administered 2018-02-15 – 2018-02-18 (×12): 60 mg via ORAL
  Filled 2018-02-15 (×12): qty 1

## 2018-02-15 NOTE — ED Notes (Signed)
ED TO INPATIENT HANDOFF REPORT  Name/Age/Gender Justin Garner 84 y.o. male  Code Status    Code Status Orders  (From admission, onward)         Start     Ordered   02/14/18 2345  Do not attempt resuscitation (DNR)  Continuous    Question Answer Comment  In the event of cardiac or respiratory ARREST Do not call a "code blue"   In the event of cardiac or respiratory ARREST Do not perform Intubation, CPR, defibrillation or ACLS   In the event of cardiac or respiratory ARREST Use medication by any route, position, wound care, and other measures to relive pain and suffering. May use oxygen, suction and manual treatment of airway obstruction as needed for comfort.      02/14/18 2351        Code Status History    Date Active Date Inactive Code Status Order ID Comments User Context   02/05/2018 0308 02/11/2018 1950 Full Code [id number]  Ivor Costa, MD ED   04/26/2017 1510 04/29/2017 1559 Full Code [id number]  Cristy Folks, MD ED   12/20/2016 2217 12/23/2016 1615 Full Code [id number]  Ivor Costa, MD ED   08/18/2016 1710 08/21/2016 2116 Full Code [id number]  Rondel Jumbo, PA-C ED   08/18/2016 1648 08/18/2016 1710 Full Code [id number]  Rondel Jumbo, PA-C ED   06/09/2016 2236 06/12/2016 1906 Full Code [id number]  Ivor Costa, MD ED   11/15/2015 0032 11/16/2015 1646 Full Code [id number]  Ivor Costa, MD ED   06/15/2015 1047 06/25/2015 1841 DNR [id number]  Allie Bossier, MD Inpatient   06/13/2015 2229 06/15/2015 1047 Full Code [id number]  Ivor Costa, MD ED   12/28/2013 0142 01/02/2014 1740 Full Code [id number]  Toy Baker, MD Inpatient   08/19/2013 1256 08/19/2013 1930 Full Code [id number]  Jettie Booze, MD Inpatient   02/21/2011 2324 02/25/2011 1327 Full Code [id number]  [place], [place] Menise, RN Inpatient    Advance Directive Documentation     Most Recent Value  Type of Advance Directive  Out of facility DNR (pink MOST or yellow form)  Pre-existing out of facility DNR order (yellow form or pink  MOST form)  -  "MOST" Form in Place?  -      Home/SNF/Other Rehab  Chief Complaint Shortness of Breath  Level of Care/Admitting Diagnosis ED Disposition    ED Disposition Condition [place]: [place] [[id number]]  Level of Care: Stepdown [14]  Admit to SDU based on following criteria: Respiratory Distress:  Frequent assessment and/or intervention to maintain adequate ventilation/respiration, pulmonary toilet, and respiratory treatment.  Admit to SDU based on following criteria: Hemodynamic compromise or significant risk of instability:  Patient requiring short term acute titration and management of vasoactive drips, and invasive monitoring (i.e., CVP and Arterial line).  Diagnosis: Acute respiratory failure with hypoxia [facility]) [id number]  Admitting Physician: Shela Leff [[id number]]  Attending Physician: Shela Leff [[id number]]  Estimated length of stay: past midnight tomorrow  Certification:: I certify this patient will need inpatient services for at least 2 midnights  PT Class (Do Not Modify): Inpatient [101]  PT Acc Code (Do Not Modify): Private [1]       Medical History Past Medical History:  Diagnosis Date  . ASCVD (arteriosclerotic cardiovascular disease)   . Cancer [facility]) 2009   Upper lobectomy;radiation; chemotherapy  . Chronic systolic CHF (congestive heart failure) ([place])   . CKD (chronic kidney disease), stage II   .  COPD (chronic obstructive pulmonary disease) ([place])   . Coronary artery disease   . Diabetes mellitus   . GERD (gastroesophageal reflux disease)   . Hyperlipidemia   . Hypertension   . Leukocytosis 07/12/2015  . PAF (paroxysmal atrial fibrillation) (H[facility])    Chronic warfarin therapy  . Primary cancer of right upper lobe of lung ([place]) 06/30/2013   RUL lobectomy 2009 with chest wall resection for superior sulcus tumor Postoperative radiation and chemotherapy; Dr Earlie Server Oncology monitor [id number]  .  Pulmonary embolism (HCC)    PMH of  . Thyroid disease     Allergies No Known Allergies  IV Location/Drains/Wounds Patient Lines/Drains/Airways Status   Active Line/Drains/Airways    Name:   Placement date:   Placement time:   Site:   Days:   Peripheral IV 02/14/18 Left Wrist   02/14/18    2135    Wrist   1   Peripheral IV 02/14/18 Right Forearm   02/14/18    2140    Forearm   1   Urethral Catheter Kalvyn Desa Latex 16 Fr.   02/14/18    2138    Latex   1          Labs/Imaging Results for orders placed or performed during the hospital encounter of 02/14/18 (from the past 48 hour(s))  Basic metabolic panel     Status: Abnormal   Collection Time: 02/14/18  9:07 PM  Result Value Ref Range   Sodium 140 135 - 145 mmol/L   Potassium 3.8 3.5 - 5.1 mmol/L   Chloride 105 98 - 111 mmol/L   CO2 24 22 - 32 mmol/L   Glucose, Bld 66 (L) 70 - 99 mg/dL   BUN 74 (H) 8 - 23 mg/dL   Creatinine, Ser 4.50 (H) 0.61 - 1.24 mg/dL   Calcium 8.1 (L) 8.9 - 10.3 mg/dL   GFR calc non Af Amer 11 (L) >60 mL/min   GFR calc Af Amer 13 (L) >60 mL/min   Anion gap 11 5 - 15    Comment: Performed at [facility], [place] [address], [place], [place] [address]  Brain natriuretic peptide     Status: Abnormal   Collection Time: 02/14/18  9:07 PM  Result Value Ref Range   B Natriuretic Peptide 148.9 (H) 0.0 - 100.0 pg/mL    Comment: Performed at [facility], [place] [address], [place], [place] [address]  Protime-INR     Status: Abnormal   Collection Time: 02/14/18  9:07 PM  Result Value Ref Range   Prothrombin Time 44.7 (H) 11.4 - 15.2 seconds    Comment: REPEATED TO VERIFY SPECIMEN CHECKED FOR CLOTS    INR 4.88 (HH)     Comment: REPEATED TO VERIFY SPECIMEN CHECKED FOR CLOTS CRITICAL RESULT CALLED TO, READ BACK BY AND VERIFIED WITHEwing Schlein RN [phone] 02/14/18 A NAVARRO Performed at [facility], [place] [address] [place], [place] [address]   CBC with  Differential     Status: Abnormal   Collection Time: 02/14/18  9:07 PM  Result Value Ref Range   WBC 31.9 (H) 4.0 - 10.5 K/uL   RBC 4.52 4.22 - 5.81 MIL/uL   Hemoglobin 14.4 13.0 - 17.0 g/dL   HCT 44.4 39.0 - 52.0 %   MCV 98.2 80.0 - 100.0 fL   MCH 31.9 26.0 - 34.0 pg   MCHC 32.4 30.0 - 36.0 g/dL   RDW 15.1 11.5 - 15.5 %   Platelets 325  150 - 400 K/uL   nRBC 0.0 0.0 - 0.2 %   Neutrophils Relative % 89 %   Neutro Abs 28.3 (H) 1.7 - 7.7 K/uL   Lymphocytes Relative 4 %   Lymphs Abs 1.4 0.7 - 4.0 K/uL   Monocytes Relative 4 %   Monocytes Absolute 1.2 (H) 0.1 - 1.0 K/uL   Eosinophils Relative 1 %   Eosinophils Absolute 0.2 0.0 - 0.5 K/uL   Basophils Relative 0 %   Basophils Absolute 0.1 0.0 - 0.1 K/uL   Immature Granulocytes 2 %   Abs Immature Granulocytes 0.72 (H) 0.00 - 0.07 K/uL    Comment: Performed at [facility], [place] [address], [place], [place] [address]  I-stat troponin, ED     Status: None   Collection Time: 02/14/18  9:44 PM  Result Value Ref Range   Troponin i, poc 0.01 0.00 - 0.08 ng/mL   Comment 3            Comment: Due to the release kinetics of cTnI, a negative result within the first hours of the onset of symptoms does not rule out myocardial infarction with certainty. If myocardial infarction is still suspected, repeat the test at appropriate intervals.   I-Stat CG4 Lactic Acid, ED     Status: None   Collection Time: 02/14/18  9:46 PM  Result Value Ref Range   Lactic Acid, Venous 1.52 0.5 - 1.9 mmol/L  Urinalysis, Routine w reflex microscopic     Status: Abnormal   Collection Time: 02/14/18 10:11 PM  Result Value Ref Range   Color, Urine YELLOW YELLOW   APPearance HAZY (A) CLEAR   Specific Gravity, Urine 1.013 1.005 - 1.030   pH 5.0 5.0 - 8.0   Glucose, UA NEGATIVE NEGATIVE mg/dL   Hgb urine dipstick LARGE (A) NEGATIVE   Bilirubin Urine NEGATIVE NEGATIVE   Ketones, ur NEGATIVE NEGATIVE mg/dL   Protein, ur 30 (A) NEGATIVE mg/dL    Nitrite NEGATIVE NEGATIVE   Leukocytes, UA SMALL (A) NEGATIVE   RBC / HPF >50 (H) 0 - 5 RBC/hpf   WBC, UA >50 (H) 0 - 5 WBC/hpf   Bacteria, UA RARE (A) NONE SEEN   Hyaline Casts, UA PRESENT     Comment: Performed at [facility], [place] [address] [place], [place] [address]  I-Stat CG4 Lactic Acid, ED     Status: None   Collection Time: 02/14/18 11:54 PM  Result Value Ref Range   Lactic Acid, Venous 1.23 0.5 - 1.9 mmol/L  Blood gas, arterial     Status: Abnormal   Collection Time: 02/15/18 12:50 AM  Result Value Ref Range   O2 Content 4.0 L/min   Delivery systems NASAL CANNULA    LHR 20 resp/min   pH, Arterial 7.419 7.350 - 7.450   pCO2 arterial 32.7 32.0 - 48.0 mmHg   pO2, Arterial 72.2 (L) 83.0 - 108.0 mmHg   Bicarbonate 20.9 20.0 - 28.0 mmol/L   Acid-base deficit 2.5 (H) 0.0 - 2.0 mmol/L   O2 Saturation 94.0 %   Patient temperature 97.6    Collection site RIGHT RADIAL    Drawn by [id number]    Sample type ARTERIAL DRAW    Allens test (pass/fail) PASS PASS    Comment: Performed at [place] [address], [place], [place] [address]   Ct Chest Wo Contrast  Result Date: 02/14/2018 CLINICAL DATA:  Shortness of breath. Question pneumothorax on radiograph. EXAM: CT CHEST  WITHOUT CONTRAST TECHNIQUE: Multidetector CT imaging of the chest was performed following the standard protocol without IV contrast. COMPARISON:  Radiograph earlier this day. Chest CT 04/26/2017 FINDINGS: Cardiovascular: Aortic atherosclerosis without aneurysm. Multi chamber cardiomegaly. There are coronary artery calcifications. Small amount of loculated pericardial fluid as before. Mediastinum/Nodes: Shotty mediastinal lymph nodes, for example left anterior carinal node measures 10 mm short axis. Limited assessment for hilar adenopathy in the absence of IV contrast. Surgical clips at the right hilum. No esophageal wall thickening. Lungs/Pleura: Advanced bullous emphysema, with  large bulla in the left lung apex and right middle lobe. Left pneumothorax with small apical component, and more prominent anteromedial and inferior component. Pneumothorax likely roughly 10%. Left lower lobe consolidation may be atelectasis or pneumonia. Patchy and ground-glass opacities in the right lower lobe. Small right pleural effusion. Upper Abdomen: Motion artifacts. No definite acute finding. Musculoskeletal: Exaggerated thoracic kyphosis. Bones are under mineralized. There are no acute or suspicious osseous abnormalities. IMPRESSION: 1. CT confirms left pneumothorax, roughly 10%. Greatest pneumothorax component inferior and anteromedially. 2. Left lower lobe consolidation, suspicious for pneumonia. Patchy opacities at the right lung base suggest infectious etiology. 3. Advanced bullous emphysema with large bulla in the left lung apex and right middle lobe. Patchy and ground-glass opacities in the right lower lobe may be infectious or inflammatory. 4. Small bilateral pleural effusions. 5. Advanced bullous emphysema. 6. Coronary artery calcifications. Aortic Atherosclerosis (ICD10-I70.0) and Emphysema (ICD10-J43.9). These results were called by telephone at the time of interpretation on 02/14/2018 at 11:13 pm to Dr. Isla Pence , who verbally acknowledged these results. Electronically Signed   By: Keith Rake M.D.   On: 02/14/2018 23:13   US Renal  Result Date: 02/15/2018 CLINICAL DATA:  Initial evaluation for acute urinary retention. EXAM: RENAL / URINARY TRACT ULTRASOUND COMPLETE COMPARISON:  None available. FINDINGS: Right Kidney: Renal measurements: 11.9 x 4.7 x 6.5 cm = volume: 190.4 mL . Echogenicity within normal limits. No mass or hydronephrosis visualized. Left Kidney: Renal measurements: 8.9 x 4.3 x 5.1 cm = volume: 11.3 mL. Left kidney demonstrates a somewhat lobulated contour with mildly heterogeneous echotexture. No hydronephrosis. 2.7 x 2.3 x 1.9 cm simple cyst present at the upper  pole. Bladder: Foley catheter in place within the bladder. Bladder itself is mild to moderately distended and not decompressed. Enlarged prostate measuring 5.7 x 4.9 x 6.1 cm, with volume of 87.6 cc IMPRESSION: 1. Persistent mild-to-moderate bladder distension with Foley catheter in place. Finding raises the possibility for clogging or malpositioning of the Foley. Bedside check recommended. 2. Enlarged prostate. 3. No hydronephrosis. 4. 2.7 cm simple left renal cyst. Electronically Signed   By: Jeannine Boga M.D.   On: 02/15/2018 01:12   Dg Chest Port 1 View  Result Date: 02/14/2018 CLINICAL DATA:  Dyspnea, history of right upper lobectomy for lung cancer EXAM: PORTABLE CHEST 1 VIEW COMPARISON:  02/11/2018 chest radiograph. FINDINGS: Stable cardiomediastinal silhouette with mild cardiomegaly. Stable postsurgical changes from partial posterior right third rib resection and right upper lobectomy. No right pneumothorax. Stable pleural-parenchymal scarring at right costophrenic angle. Bullous emphysema at the left lung apex with suggestion of new tiny apical left pneumothorax. No pulmonary edema. New left retrocardiac consolidation. IMPRESSION: 1. Bullous emphysema at the left lung apex with suggestion of new tiny apical left pneumothorax. No mediastinal shift. 2. New left retrocardiac consolidation, which could represent atelectasis, aspiration or pneumonia. 3. Stable postsurgical changes in the right hemithorax from right upper lobectomy. Critical Value/emergent results were called  by telephone at the time of interpretation on 02/14/2018 at 9:27 pm to Dr. Isla Pence , who verbally acknowledged these results. Electronically Signed   By: Ilona Sorrel M.D.   On: 02/14/2018 21:29   EKG Interpretation  Date/Time:  Thursday February 14 2018 21:22:27 EST Ventricular Rate:  103 PR Interval:    QRS Duration: 143 QT Interval:  451 QTC Calculation: 501 R Axis:   49 Text Interpretation:  Atrial  fibrillation Right bundle branch block No significant change since last tracing Confirmed by Isla Pence [phone]) on 02/14/2018 9:29:24 PM   Pending Labs Unresulted Labs (From admission, onward)    Start     Ordered   02/15/18 0500  CBC  Tomorrow morning,   R     02/14/18 2351   02/15/18 8756  Basic metabolic panel  Tomorrow morning,   R     02/14/18 2351   02/15/18 0500  Protime-INR  Tomorrow morning,   R     02/15/18 0048   02/15/18 0004  Respiratory Panel by PCR  (Respiratory virus panel with precautions)  ONCE - STAT,   R     02/15/18 0004   02/14/18 2347  Magnesium  Add-on,   R     02/14/18 2351   02/14/18 2224  Culture, blood (routine x 2)  BLOOD CULTURE X 2,   STAT     02/14/18 2223   02/14/18 2108  Urine culture  ONCE - STAT,   STAT     02/14/18 2108          Vitals/Pain Today's Vitals   02/14/18 2330 02/14/18 2345 02/15/18 0015 02/15/18 0113  BP: 112/67 (!) 91/58 103/68 (!) 90/58  Pulse: 79 85 (!) 105 (!) 55  Resp: (!) 24 (!) 23 (!) 23 (!) 21  Temp:      TempSrc:      SpO2: 93% 94% 96% 98%    Isolation Precautions Droplet precaution  Medications Medications  diltiazem (CARDIZEM) 100 mg in dextrose 5% 172mL (1 mg/mL) infusion (has no administration in time range)  levothyroxine (SYNTHROID, LEVOTHROID) injection 56 mcg (has no administration in time range)  bisacodyl (DULCOLAX) suppository 10 mg (has no administration in time range)  mometasone-formoterol (DULERA) 100-5 MCG/ACT inhaler 2 puff (2 puffs Inhalation Not Given 02/15/18 0105)  acetaminophen (TYLENOL) tablet 650 mg (has no administration in time range)    Or  acetaminophen (TYLENOL) suppository 650 mg (has no administration in time range)  0.9 %  sodium chloride infusion ( Intravenous New Bag/Given 02/15/18 0115)  potassium chloride 10 mEq in 100 mL IVPB (has no administration in time range)  insulin aspart (novoLOG) injection 0-9 Units (has no administration in time range)  ceFEPIme (MAXIPIME) 1  g in sodium chloride 0.9 % 100 mL IVPB (has no administration in time range)  vancomycin variable dose per unstable renal function (pharmacist dosing) (has no administration in time range)  ipratropium-albuterol (DUONEB) 0.5-2.5 (3) MG/3ML nebulizer solution 3 mL (has no administration in time range)  ipratropium-albuterol (DUONEB) 0.5-2.5 (3) MG/3ML nebulizer solution 3 mL (3 mLs Nebulization Given 02/14/18 2142)  methylPREDNISolone sodium succinate (SOLU-MEDROL) 125 mg/2 mL injection 125 mg (125 mg Intravenous Given 02/14/18 2142)  ceFEPIme (MAXIPIME) 1 g in sodium chloride 0.9 % 100 mL IVPB ( Intravenous Rate/Dose Change 02/14/18 2308)  vancomycin (VANCOCIN) 2,000 mg in sodium chloride 0.9 % 500 mL IVPB ( Intravenous Rate/Dose Verify 02/14/18 2341)    Mobility non-ambulatory

## 2018-02-15 NOTE — Consult Note (Signed)
NAME:  Justin Garner, MRN:  [id number], DOB:  [birth year], LOS: 1 ADMISSION DATE:  02/14/2018, CONSULTATION DATE:  02/15/18 REFERRING MD: [place] Pink, CHIEF COMPLAINT: Pneumothorax  Brief History    Patient was admitted with shortness of breath History of congestive heart failure, history of lung cancer status post right upper lobectomy Came in from a nursing home with shortness of breath  History of present illness   Came in with worsening shortness of breath  Past Medical History  Coronary artery disease Lung cancer status post lobectomy, radiation treatment, chemotherapy Chronic kidney disease Chronic obstructive pulmonary disease Diabetes GERD Hypertension Atrial for relation History of PE   Significant Hospital Events     Consults:  PCCM  Procedures:  None  Significant Diagnostic Tests:  Chest x-ray with a pneumothorax CT scan of the chest reviewed by myself showing basal pneumothorax, multiple blebs  Micro Data:  Blood culture 02/15/2018 Urine culture 02/14/2018  Antimicrobials:  Vancomycin  11/28 Cefepime  11/28  Interim history/subjective:  He has no complaints today feels generally well Denies any chest pains or chest discomfort  Objective   Blood pressure 91/69, pulse (!) 110, temperature (!) 96.7 F (35.9 C), temperature source Axillary, resp. rate (!) 29, SpO2 98 %.        Intake/Output Summary (Last 24 hours) at 02/15/2018 1841 Last data filed at 02/15/2018 1700 Gross per 24 hour  Intake 1386.01 ml  Output 3150 ml  Net -1763.99 ml   There were no vitals filed for this visit.  Examination: General: Frail, HENT: Moist oral mucosa Lungs: Decreased air entry bilaterally Cardiovascular: S1-S2 appreciated Abdomen: Bowel sounds appreciated Extremities: No edema, no clubbing Neuro: Awake, alert oriented x1  Resolved Hospital Problem list    Assessment & Plan:   Pneumothorax -Stable at the present time -Patient has multiple  blebs -Not short of breath for the present time -No mediastinal shift -Invasive intervention at the present time -Monitor radiologically  History of lung cancer -Status post lobectomy, radiation and chemotherapy -Has no mass lesions on his CT at present  Healthcare associated pneumonia -Continue antibiotic therapy  Hypoxemic respiratory failure -Secondary to in-stent extensive emphysematous disease -Stable at present  Atrial fibrillation -Stable  Acute kidney injury -Monitor electrolytes -Avoid nephrotoxic's - Metabolic encephalopathy -Stable  Chronic systolic congestive heart failure -Diuretics and antihypertensives on hold at present  Coronary artery disease  Type 2 diabetes -Insulin coverage  Best practice:  Diet: Low-sodium diet DVT prophylaxis: SCDs GI prophylaxis:  Glucose control:  Mobility: Bedrest Code Status: dnr Family Communication: No family at bedside at present Disposition:   Labs   CBC: Recent Labs  Lab 02/09/18 0313 02/10/18 0540 02/11/18 0617 02/14/18 2107 02/15/18 0229  WBC 15.1* 11.5* 16.3* 31.9* 33.5*  NEUTROABS  --  8.6* 11.7* 28.3*  --   HGB 13.3 12.6* 14.2 14.4 13.6  HCT 40.0 38.9* 44.3 44.4 42.8  MCV 94.8 96.3 97.4 98.2 99.8  PLT 239 229 260 325 116    Basic Metabolic Panel: Recent Labs  Lab 02/09/18 0313 02/10/18 0540 02/11/18 0617 02/14/18 2107 02/15/18 0229  NA 141 141 139 140 140  K 3.1* 3.5 3.7 3.8 3.8  CL 105 109 106 105 105  CO2 24 26 22 24  17*  GLUCOSE 110* 128* 196* 66* 114*  BUN 31* 30* 29* 74* 66*  CREATININE 1.57* 1.25* 1.50* 4.50* 3.86*  CALCIUM 8.6* 8.3* 8.5* 8.1* 7.9*  MG  --   --   --   --  1.8  PHOS  --  3.6 3.4  --   --    GFR: Estimated Creatinine Clearance: 18.3 mL/min (A) (by C-G formula based on SCr of 3.86 mg/dL (H)). Recent Labs  Lab 02/10/18 0540 02/11/18 0617 02/14/18 2107 02/14/18 2146 02/14/18 2354 02/15/18 0229  WBC 11.5* 16.3* 31.9*  --   --  33.5*  LATICACIDVEN  --   --    --  1.52 1.23  --     Liver Function Tests: Recent Labs  Lab 02/10/18 0540 02/11/18 0617  ALBUMIN 2.4* 3.0*   No results for input(s): LIPASE, AMYLASE in the last 168 hours. No results for input(s): AMMONIA in the last 168 hours.  ABG    Component Value Date/Time   PHART 7.419 02/15/2018 0050   PCO2ART 32.7 02/15/2018 0050   PO2ART 72.2 (L) 02/15/2018 0050   HCO3 20.9 02/15/2018 0050   TCO2 25 08/18/2016 1718   ACIDBASEDEF 2.5 (H) 02/15/2018 0050   O2SAT 94.0 02/15/2018 0050     Coagulation Profile: Recent Labs  Lab 02/09/18 0313 02/10/18 0540 02/11/18 0617 02/14/18 2107 02/15/18 0229  INR 1.89 2.41 2.16 4.88* 4.93*    Cardiac Enzymes: No results for input(s): CKTOTAL, CKMB, CKMBINDEX, TROPONINI in the last 168 hours.  HbA1C: Hgb A1c MFr Bld  Date/Time Value Ref Range Status  02/07/2018 03:47 AM 6.0 (H) 4.8 - 5.6 % Final    Comment:    (NOTE) Pre diabetes:          5.7%-6.4% Diabetes:              >6.4% Glycemic control for   <7.0% adults with diabetes   08/18/2016 11:58 PM 8.9 (H) 4.8 - 5.6 % Final    Comment:    (NOTE)         Pre-diabetes: 5.7 - 6.4         Diabetes: >6.4         Glycemic control for adults with diabetes: <7.0     CBG: Recent Labs  Lab 02/11/18 0601 02/11/18 1110 02/15/18 0822 02/15/18 1215 02/15/18 1612  GLUCAP 182* 118* 193* 209* 157*    Review of Systems:   Review of Systems  Unable to perform ROS: Mental status change     Past Medical History  He,  has a past medical history of ASCVD (arteriosclerotic cardiovascular disease), Cancer ([place]) (7124), Chronic systolic CHF (congestive heart failure) ([place]), CKD (chronic kidney disease), stage II, COPD (chronic obstructive pulmonary disease) ([place]), Coronary artery disease, Diabetes mellitus, GERD (gastroesophageal reflux disease), Hyperlipidemia, Hypertension, Leukocytosis (07/12/2015), PAF (paroxysmal atrial fibrillation) ([place]), Primary cancer of right upper lobe of lung  ([place]) (06/30/2013), Pulmonary embolism ([place]), and Thyroid disease.   Surgical History    Past Surgical History:  Procedure Laterality Date  . CAROTID STENT    . CHOLECYSTECTOMY    . FIBEROPTIC BRONCHOSCOPY AND MEIASTINOSCOPY  06/07/2007  . HERNIA REPAIR    . INGUINAL HERNIA REPAIR  10/10/2010  . LEFT HEART CATHETERIZATION WITH CORONARY ANGIOGRAM N/A 08/19/2013   Procedure: LEFT HEART CATHETERIZATION WITH CORONARY ANGIOGRAM;  Surgeon: Jettie Booze, MD;  Location: [facility] CATH LAB;  Service: Cardiovascular;  Laterality: N/A;  . R VATS,THORACOTOMY AND UPPER LOBECTOMY  08/02/2007     Social History   reports that he quit smoking about 16 years ago. His smoking use included cigarettes. He has a 50.00 pack-year smoking history. He has quit using smokeless tobacco. He reports that he does not drink alcohol or use drugs.   Family  History   His family history includes Diabetes in his brother; Heart attack in his father; Heart disease in his father. There is no history of Cancer, Stroke, or Hypertension.   Allergies No Known Allergies   Home Medications  Prior to Admission medications   Medication Sig Start Date End Date Taking? Authorizing Provider  acetaminophen (TYLENOL) 325 MG tablet Take 2 tablets (650 mg total) by mouth every 6 (six) hours as needed for mild pain (or Fever >/= 101). 02/11/18  Yes Nita Sells, MD  albuterol (PROVENTIL HFA;VENTOLIN HFA) 108 (90 Base) MCG/ACT inhaler Inhale 2 puffs into the lungs every 6 (six) hours as needed for wheezing or shortness of breath. 11/16/15  Yes Mikhail, [place], DO  Artificial Tear Ointment (DRY EYES OP) Place 1 drop into both eyes as needed (dry eye).   Yes [provider]  atorvastatin (LIPITOR) 20 MG tablet TAKE 1 TABLET BY MOUTH  DAILY Patient taking differently: Take 20 mg by mouth daily at 6 PM.  05/09/17  Yes Jettie Booze, MD  budesonide-formoterol [facility]) 80-4.5 MCG/ACT inhaler Inhale 2 puffs into the lungs  every morning. 11/16/15  Yes Mikhail, Velta [place], DO  diltiazem (CARDIZEM CD) 360 MG 24 hr capsule Take 1 capsule (360 mg total) by mouth daily. 09/10/17  Yes Jettie Booze, MD  fish oil-omega-3 fatty acids 1000 MG capsule Take 1 g by mouth daily.    Yes [provider]  furosemide (LASIX) 20 MG tablet Take 2 tablets (40 mg total) by mouth daily. 06/27/17  Yes Simmons, Brittainy M, PA-C  glipiZIDE (GLUCOTROL) 10 MG tablet Take 10 mg by mouth daily before breakfast.   Yes [provider]  levothyroxine (SYNTHROID, LEVOTHROID) 112 MCG tablet Take 112 mcg by mouth daily.     Yes [provider]  lisinopril (PRINIVIL,ZESTRIL) 5 MG tablet Take 0.5 tablets (2.5 mg total) by mouth daily. 01/03/17  Yes Jettie Booze, MD  metoprolol tartrate (LOPRESSOR) 25 MG tablet TAKE ONE-HALF TABLET BY  MOUTH TWO TIMES DAILY Patient taking differently: Take 12.5 mg by mouth 2 (two) times daily.  05/09/17  Yes Jettie Booze, MD  mirtazapine (REMERON) 15 MG tablet Take 15 mg by mouth at bedtime.   Yes [provider]  nitroGLYCERIN (NITROSTAT) 0.4 MG SL tablet Place 1 tablet (0.4 mg total) under the tongue every 5 (five) minutes as needed for chest pain up to 3 doses 08/03/16  Yes Jettie Booze, MD  OLANZapine (ZYPREXA) 2.5 MG tablet Take 1 tablet (2.5 mg total) by mouth at bedtime. 02/11/18  Yes Nita Sells, MD  omeprazole (PRILOSEC) 20 MG capsule Take 20 mg by mouth daily.    Yes [provider]  sennosides-docusate sodium (SENOKOT-S) 8.6-50 MG tablet Take 1 tablet by mouth daily as needed for constipation.    Yes [provider]  warfarin (COUMADIN) 5 MG tablet TAKE AS DIRECTED BY  COUMADIN CLINIC Patient taking differently: Take 5 mg by mouth daily.  12/03/17  Yes Jettie Booze, MD

## 2018-02-15 NOTE — Progress Notes (Signed)
CRITICAL VALUE ALERT  Critical Value:  INR 4.93  Date & Time Notied:  2263 - 02/15/2018  Provider Notified: Silas [place]  Orders Received/Actions taken: No new orders given. Will continue to monitor.

## 2018-02-15 NOTE — Progress Notes (Signed)
Pharmacy Antibiotic Note  Justin Garner is a 84 y.o. male admitted on 02/14/2018 with pneumonia.  Pharmacy has been consulted for cefepime and vancomycin dosing.  Plan: Cefepime 1 Gm IV q24h Vancomycin 2 Gm x1 then f/u scr/VR for additional dosing Goal AUC = 400-500     Temp (24hrs), Avg:97.6 F (36.4 C), Min:97.6 F (36.4 C), Max:97.6 F (36.4 C)  Recent Labs  Lab 02/08/18 1139 02/09/18 0313 02/10/18 0540 02/11/18 0617 02/14/18 2107 02/14/18 2146 02/14/18 2354  WBC  --  15.1* 11.5* 16.3* 31.9*  --   --   CREATININE 1.46* 1.57* 1.25* 1.50* 4.50*  --   --   LATICACIDVEN  --   --   --   --   --  1.52 1.23    Estimated Creatinine Clearance: 15.7 mL/min (A) (by C-G formula based on SCr of 4.5 mg/dL (H)).    No Known Allergies  Antimicrobials this admission: 11/28 cefepime >>  11/28 vancomycin >>   Dose adjustments this admission:   Microbiology results:  BCx:   UCx:    Sputum:    MRSA PCR:   Thank you for allowing pharmacy to be a part of this patient's care.  Dorrene German 02/15/2018 12:24 AM

## 2018-02-15 NOTE — Progress Notes (Signed)
[facility] PROGRESS NOTE  Justin Garner PNT:[id number] DOB: [birth year] DOA: 02/14/2018  PCP: [name], MD  Brief History/IntervalSummary:  84 y.o. male with medical history significant of CAD, CKD stage III, COPD, chronic systolic CHF, type 2 diabetes, GERD, hypertension, hyperlipidemia, paroxysmal A. fib on Coumadin, history of lung cancer status post right upper lobe lobectomy in 2009 prior PE presenting to the hospital for evaluation of shortness of breath and blood in Foley catheter. Patient was recently admitted from February 04, 2018 to February 11, 2018 for shortness of breath, confusion, and rhinovirus infection.  He was discharged home on 2 L oxygen via nasal cannula.    He was found to require higher doses of oxygen.  Was noted to have urinary retention requiring a change in Foley catheter.  Chest x-ray showed small pneumothorax.  CT scan showed the same and also showed evidence for pneumonia.  Patient was hospitalized and started on broad-spectrum antibiotics.     Reason for Visit: Healthcare associated pneumonia.  Atrial fibrillation with RVR  Consultants: Urology.  Pulmonology  Procedures: Replacement of Foley catheter  Antibiotics: Vancomycin and cefepime  Subjective/Interval History: Patient states that he feels well.  He denies any shortness of breath.  Has been having a cough but unable to expectorate anything.  Denies any chest pain.  No abdominal pain.  No nausea or vomiting.  ROS: Denies any headaches  Objective:  Vital Signs  Vitals:   02/15/18 0600 02/15/18 0700 02/15/18 0749 02/15/18 0800  BP: (!) 78/46 118/81    Pulse: (!) 45 (!) 46 (!) 120   Resp: (!) 23 (!) 27 (!) 22   Temp:    (!) 97.5 F (36.4 C)  TempSrc:    Oral  SpO2:   98%     Intake/Output Summary (Last 24 hours) at 02/15/2018 1025 Last data filed at 02/15/2018 0900 Gross per 24 hour  Intake 711.92 ml  Output 2650 ml  Net -1938.08 ml   There were no vitals filed for  this visit.  General appearance: alert, distracted, fatigued and no distress Head: Normocephalic, without obvious abnormality, atraumatic Resp: Coarse breath sounds bilaterally.  Crackles heard bilaterally.  Few rhonchi.  No wheezing.  tachypneic at rest. Cardio: S1-S2 is tachycardic.  Irregularly irregular.  Telemetry shows heart rate in the 110- 130 range GI: soft, non-tender; bowel sounds normal; no masses,  no organomegaly Extremities: Minimal edema noted bilateral lower extremities Pulses: 2+ and symmetric Neurologic: Awake alert.  Distracted.  No obvious focal neurological deficits.  Lab Results:  Data Reviewed: I have personally reviewed following labs and imaging studies  CBC: Recent Labs  Lab 02/09/18 0313 02/10/18 0540 02/11/18 0617 02/14/18 2107 02/15/18 0229  WBC 15.1* 11.5* 16.3* 31.9* 33.5*  NEUTROABS  --  8.6* 11.7* 28.3*  --   HGB 13.3 12.6* 14.2 14.4 13.6  HCT 40.0 38.9* 44.3 44.4 42.8  MCV 94.8 96.3 97.4 98.2 99.8  PLT 239 229 260 325 086    Basic Metabolic Panel: Recent Labs  Lab 02/09/18 0313 02/10/18 0540 02/11/18 0617 02/14/18 2107 02/15/18 0229  NA 141 141 139 140 140  K 3.1* 3.5 3.7 3.8 3.8  CL 105 109 106 105 105  CO2 24 26 22 24  17*  GLUCOSE 110* 128* 196* 66* 114*  BUN 31* 30* 29* 74* 66*  CREATININE 1.57* 1.25* 1.50* 4.50* 3.86*  CALCIUM 8.6* 8.3* 8.5* 8.1* 7.9*  MG  --   --   --   --  1.8  PHOS  --  3.6 3.4  --   --     GFR: Estimated Creatinine Clearance: 18.3 mL/min (A) (by C-G formula based on SCr of 3.86 mg/dL (H)).  Liver Function Tests: Recent Labs  Lab 02/10/18 0540 02/11/18 0617  ALBUMIN 2.4* 3.0*    Coagulation Profile: Recent Labs  Lab 02/09/18 0313 02/10/18 0540 02/11/18 0617 02/14/18 2107 02/15/18 0229  INR 1.89 2.41 2.16 4.88* 4.93*    CBG: Recent Labs  Lab 02/10/18 1714 02/10/18 2217 02/11/18 0601 02/11/18 1110 02/15/18 0822  GLUCAP 111* 187* 182* 118* 193*     Radiology Studies: Ct Chest  Wo Contrast  Result Date: 02/14/2018 CLINICAL DATA:  Shortness of breath. Question pneumothorax on radiograph. EXAM: CT CHEST WITHOUT CONTRAST TECHNIQUE: Multidetector CT imaging of the chest was performed following the standard protocol without IV contrast. COMPARISON:  Radiograph earlier this day. Chest CT 04/26/2017 FINDINGS: Cardiovascular: Aortic atherosclerosis without aneurysm. Multi chamber cardiomegaly. There are coronary artery calcifications. Small amount of loculated pericardial fluid as before. Mediastinum/Nodes: Shotty mediastinal lymph nodes, for example left anterior carinal node measures 10 mm short axis. Limited assessment for hilar adenopathy in the absence of IV contrast. Surgical clips at the right hilum. No esophageal wall thickening. Lungs/Pleura: Advanced bullous emphysema, with large bulla in the left lung apex and right middle lobe. Left pneumothorax with small apical component, and more prominent anteromedial and inferior component. Pneumothorax likely roughly 10%. Left lower lobe consolidation may be atelectasis or pneumonia. Patchy and ground-glass opacities in the right lower lobe. Small right pleural effusion. Upper Abdomen: Motion artifacts. No definite acute finding. Musculoskeletal: Exaggerated thoracic kyphosis. Bones are under mineralized. There are no acute or suspicious osseous abnormalities. IMPRESSION: 1. CT confirms left pneumothorax, roughly 10%. Greatest pneumothorax component inferior and anteromedially. 2. Left lower lobe consolidation, suspicious for pneumonia. Patchy opacities at the right lung base suggest infectious etiology. 3. Advanced bullous emphysema with large bulla in the left lung apex and right middle lobe. Patchy and ground-glass opacities in the right lower lobe may be infectious or inflammatory. 4. Small bilateral pleural effusions. 5. Advanced bullous emphysema. 6. Coronary artery calcifications. Aortic Atherosclerosis (ICD10-I70.0) and Emphysema  (ICD10-J43.9). These results were called by telephone at the time of interpretation on 02/14/2018 at 11:13 pm to Dr. Isla Pence , who verbally acknowledged these results. Electronically Signed   By: Keith Rake M.D.   On: 02/14/2018 23:13   US Renal  Result Date: 02/15/2018 CLINICAL DATA:  Initial evaluation for acute urinary retention. EXAM: RENAL / URINARY TRACT ULTRASOUND COMPLETE COMPARISON:  None available. FINDINGS: Right Kidney: Renal measurements: 11.9 x 4.7 x 6.5 cm = volume: 190.4 mL . Echogenicity within normal limits. No mass or hydronephrosis visualized. Left Kidney: Renal measurements: 8.9 x 4.3 x 5.1 cm = volume: 11.3 mL. Left kidney demonstrates a somewhat lobulated contour with mildly heterogeneous echotexture. No hydronephrosis. 2.7 x 2.3 x 1.9 cm simple cyst present at the upper pole. Bladder: Foley catheter in place within the bladder. Bladder itself is mild to moderately distended and not decompressed. Enlarged prostate measuring 5.7 x 4.9 x 6.1 cm, with volume of 87.6 cc IMPRESSION: 1. Persistent mild-to-moderate bladder distension with Foley catheter in place. Finding raises the possibility for clogging or malpositioning of the Foley. Bedside check recommended. 2. Enlarged prostate. 3. No hydronephrosis. 4. 2.7 cm simple left renal cyst. Electronically Signed   By: Jeannine Boga M.D.   On: 02/15/2018 01:12   Dg Chest [facility] 1 View  Result  Date: 02/15/2018 CLINICAL DATA:  Left pneumothorax. EXAM: PORTABLE CHEST 1 VIEW COMPARISON:  Radiographs and CT yesterday. FINDINGS: Apical and lateral components of small left pneumothorax again visualized, the inferior component on CT (which was the largest component) is not visualized radiographically. Left apical blebs. Left lower lobe consolidation again seen. Increasing patchy right lung base opacity. Unchanged heart size and mediastinal contours. IMPRESSION: 1. Apical and lateral components of small left pneumothorax again seen,  slightly increased from prior radiograph. The larger inferior component is not visualized radiographically. 2. Left lower lobe consolidation again seen. Increasing patchy right lung base opacity. Electronically Signed   By: Keith Rake M.D.   On: 02/15/2018 03:42   Dg Chest Port 1 View  Result Date: 02/14/2018 CLINICAL DATA:  Dyspnea, history of right upper lobectomy for lung cancer EXAM: PORTABLE CHEST 1 VIEW COMPARISON:  02/11/2018 chest radiograph. FINDINGS: Stable cardiomediastinal silhouette with mild cardiomegaly. Stable postsurgical changes from partial posterior right third rib resection and right upper lobectomy. No right pneumothorax. Stable pleural-parenchymal scarring at right costophrenic angle. Bullous emphysema at the left lung apex with suggestion of new tiny apical left pneumothorax. No pulmonary edema. New left retrocardiac consolidation. IMPRESSION: 1. Bullous emphysema at the left lung apex with suggestion of new tiny apical left pneumothorax. No mediastinal shift. 2. New left retrocardiac consolidation, which could represent atelectasis, aspiration or pneumonia. 3. Stable postsurgical changes in the right hemithorax from right upper lobectomy. Critical Value/emergent results were called by telephone at the time of interpretation on 02/14/2018 at 9:27 pm to Dr. Isla Pence , who verbally acknowledged these results. Electronically Signed   By: Ilona Sorrel M.D.   On: 02/14/2018 21:29     Medications:  Scheduled: . diltiazem  60 mg Oral Q6H  . insulin aspart  0-9 Units Subcutaneous TID WC  . ipratropium-albuterol  3 mL Nebulization Q6H  . levothyroxine  56 mcg Intravenous Daily  . mouth rinse  15 mL Mouth Rinse BID  . metoprolol tartrate  12.5 mg Oral BID  . mometasone-formoterol  2 puff Inhalation BID  . vancomycin variable dose per unstable renal function (pharmacist dosing)   Does not apply See admin instructions   Continuous: . sodium chloride 100 mL/hr at 02/15/18  0700  . ceFEPime (MAXIPIME) IV     GYI:[id number] **OR** acetaminophen, albuterol, bisacodyl    Assessment/Plan:  Healthcare associated pneumonia with acute hypoxic respiratory failure/pneumothorax/sepsis Pulmonology to see the patient regarding the pneumothorax.  These appears to be small in size.  Most likely will not require any intervention.  He does have multiple blebs and 1 of which must have ruptured.  Continue with higher doses of oxygen.  Continue with broad-spectrum antibiotics with vancomycin and cefepime.  Follow-up on culture data.  He did have sepsis at admission due to leukocytosis and tachycardia and tachypnea.  Heart rate remains elevated.  Continue stepdown status for now.  Speech therapy to see for swallow evaluation.  Lactic acid level was normal.  Atrial fibrillation with RVR Patient with known history of atrial fibrillation.  Blood pressures have been soft so he was not started on Cardizem infusion overnight.  No clear reason for him to be n.p.o.  We will put him back on his oral rate limiting medications including metoprolol as well as Cardizem.  Monitor closely.  Echocardiogram from June 9381 showed systolic CHF with a EF of about 35%-40%.  TSH was 4.5 on 11/19 with a normal free T4.  Chads 2 vascular score is 6.  Acute kidney injury on chronic kidney disease stage III with hematuria/acute urinary retention Patient had a indwelling Foley catheter which was placed during his previous hospitalization.  Apparently the catheter was malpositioned resulting in retention.  This is the most likely reason for his renal failure.  Catheter was replaced in the emergency department.  He has had good urine output.  His creatinine is improved.  Urology has seen the patient.  Renal ultrasound did not show any hydronephrosis.  Hematuria appears to be resolving.  Supratherapeutic INR Patient on warfarin for history of atrial fibrillation.  INR supratherapeutic.  Warfarin on  hold.  Suspected UTI UA was noted to be abnormal.  This could be due to hematuria.  Continue antibiotics as outlined above.  Follow-up on urine culture.  QT prolongation on EKG Monitor electrolytes closely.  Telemetry.  Magnesium 1.8.  EKG reviewed.  Hard to assess QT interval based on that EKG.  We will repeat once patient's heart rate is better controlled.  Acute metabolic encephalopathy Multifactorial etiology.  Mental status appears to be stable.  No focal neurological deficits.  History of COPD Currently not wheezing.  Continue home medications.  Chronic systolic CHF Ejection fraction noted to be 35 to 40% based on echo from June 2018.  We will stop with IV fluids.  Holding his diuretics and lisinopril.  History of coronary artery disease Stable.  Patient denies any chest pain.  Continue to monitor.  Resume beta-blocker.  Essential hypertension Monitor blood pressures closely.  Noted to be soft overnight.  Type 2 diabetes mellitus, controlled HbA1c 6.0 on 02/07/2018.  Continue SSI.  Continue to monitor CBGs.  History of dementia with history of behavioral disturbances Stable currently.  Patient was started on Zyprexa during his previous hospitalization.  Currently on hold due to prolonged QT.  History of hypothyroidism Synthroid  Physical deconditioning PT and OT evaluation.  DVT Prophylaxis: Supratherapeutic INR    Code Status: DNR Family Communication: No family at bedside Disposition Plan: Management as outlined above.    LOS: 1 day   [facility] [facility] Pager [phone] 02/15/2018, 10:25 AM  If 7PM-7AM, please contact night-coverage at [email or web address], password [facility]

## 2018-02-15 NOTE — Progress Notes (Signed)
PT Cancellation Note  Patient Details Name: Justin Garner MRN: [id number] DOB: 12-20-1932   Cancelled Treatment:     PT order received but eval deferred this date on advice of RN - pt with elevated HR and increased work of breathing.  Will follow.   Phong Isenberg 02/15/2018, 10:31 AM

## 2018-02-15 NOTE — Consult Note (Signed)
Urology Consult  Consulting MD: Antonietta Jewel, MD  CC: Catheter problem  HPI: This is a 84year old male admitted yesterday for gross hematuria and respiratory issues.  He was recently hospitalized for viral infection, confusion and shortness of breath.  During the hospital stay, catheter was placed.  Apparently, there were no problems with the catheter during his hospitalization.  However, he presented to the emergency room with gross hematuria and low urinary output.  In the emergency room, it was evident that the catheter was displaced.  The old wound was removed and subsequently replaced with a new catheter.  Brisk urinary output resulted.  The urine has been somewhat discolored.  There is not been significant penile bleeding.  Because of a creatinine of 4.5, renal ultrasound was performed revealing hydronephrosis and urine still in the bladder.  Urologic consultation was requested.  PMH: Past Medical History:  Diagnosis Date  . ASCVD (arteriosclerotic cardiovascular disease)   . Cancer [facility]) 2009   Upper lobectomy;radiation; chemotherapy  . Chronic systolic CHF (congestive heart failure) ([place])   . CKD (chronic kidney disease), stage II   . COPD (chronic obstructive pulmonary disease) ([place])   . Coronary artery disease   . Diabetes mellitus   . GERD (gastroesophageal reflux disease)   . Hyperlipidemia   . Hypertension   . Leukocytosis 07/12/2015  . PAF (paroxysmal atrial fibrillation) (HCC)    Chronic warfarin therapy  . Primary cancer of right upper lobe of lung ([place]) 06/30/2013   RUL lobectomy 2009 with chest wall resection for superior sulcus tumor Postoperative radiation and chemotherapy; Dr Earlie Server Oncology monitor [phone]  . Pulmonary embolism (HCC)    PMH of  . Thyroid disease     PSH: Past Surgical History:  Procedure Laterality Date  . CAROTID STENT    . CHOLECYSTECTOMY    . FIBEROPTIC BRONCHOSCOPY AND MEIASTINOSCOPY  06/07/2007  . HERNIA REPAIR    . INGUINAL  HERNIA REPAIR  10/10/2010  . LEFT HEART CATHETERIZATION WITH CORONARY ANGIOGRAM N/A 08/19/2013   Procedure: LEFT HEART CATHETERIZATION WITH CORONARY ANGIOGRAM;  Surgeon: Jettie Booze, MD;  Location: [facility] CATH LAB;  Service: Cardiovascular;  Laterality: N/A;  . R VATS,THORACOTOMY AND UPPER LOBECTOMY  08/02/2007    Allergies: No Known Allergies  Medications: Medications Prior to Admission  Medication Sig Dispense Refill Last Dose  . acetaminophen (TYLENOL) 325 MG tablet Take 2 tablets (650 mg total) by mouth every 6 (six) hours as needed for mild pain (or Fever >/= 101).   unknown  . albuterol (PROVENTIL HFA;VENTOLIN HFA) 108 (90 Base) MCG/ACT inhaler Inhale 2 puffs into the lungs every 6 (six) hours as needed for wheezing or shortness of breath. 1 Inhaler 2 unknown  . Artificial Tear Ointment (DRY EYES OP) Place 1 drop into both eyes as needed (dry eye).   Past Month at Unknown time  . atorvastatin (LIPITOR) 20 MG tablet TAKE 1 TABLET BY MOUTH  DAILY (Patient taking differently: Take 20 mg by mouth daily at 6 PM. ) 90 tablet 3 02/13/2018 at Unknown time  . budesonide-formoterol (SYMBICORT) 80-4.5 MCG/ACT inhaler Inhale 2 puffs into the lungs every morning. 1 Inhaler 2 02/14/2018 at Unknown time  . diltiazem (CARDIZEM CD) 360 MG 24 hr capsule Take 1 capsule (360 mg total) by mouth daily. 90 capsule 2 02/14/2018 at Unknown time  . fish oil-omega-3 fatty acids 1000 MG capsule Take 1 g by mouth daily.    02/14/2018 at Unknown time  . furosemide (LASIX) 20 MG tablet  Take 2 tablets (40 mg total) by mouth daily. 180 tablet 2 02/14/2018 at Unknown time  . glipiZIDE (GLUCOTROL) 10 MG tablet Take 10 mg by mouth daily before breakfast.   02/14/2018 at Unknown time  . levothyroxine (SYNTHROID, LEVOTHROID) 112 MCG tablet Take 112 mcg by mouth daily.     02/14/2018 at Unknown time  . lisinopril (PRINIVIL,ZESTRIL) 5 MG tablet Take 0.5 tablets (2.5 mg total) by mouth daily. 45 tablet 1 02/14/2018 at Unknown  time  . metoprolol tartrate (LOPRESSOR) 25 MG tablet TAKE ONE-HALF TABLET BY  MOUTH TWO TIMES DAILY (Patient taking differently: Take 12.5 mg by mouth 2 (two) times daily. ) 90 tablet 3 02/14/2018 at 0800  . mirtazapine (REMERON) 15 MG tablet Take 15 mg by mouth at bedtime.   02/13/2018 at Unknown time  . nitroGLYCERIN (NITROSTAT) 0.4 MG SL tablet Place 1 tablet (0.4 mg total) under the tongue every 5 (five) minutes as needed for chest pain up to 3 doses 25 tablet 3 unknown  . OLANZapine (ZYPREXA) 2.5 MG tablet Take 1 tablet (2.5 mg total) by mouth at bedtime. 30 tablet 0 02/13/2018 at Unknown time  . omeprazole (PRILOSEC) 20 MG capsule Take 20 mg by mouth daily.    02/14/2018 at Unknown time  . sennosides-docusate sodium (SENOKOT-S) 8.6-50 MG tablet Take 1 tablet by mouth daily as needed for constipation.    unknown  . warfarin (COUMADIN) 5 MG tablet TAKE AS DIRECTED BY  COUMADIN CLINIC (Patient taking differently: Take 5 mg by mouth daily. ) 135 tablet 1 02/14/2018 at 1800     Social History: Social History   Socioeconomic History  . Marital status: Married    Spouse name: Not on file  . Number of children: Not on file  . Years of education: Not on file  . Highest education level: Not on file  Occupational History  . Occupation: retired  Scientific laboratory technician  . Financial resource strain: Not on file  . Food insecurity:    Worry: Not on file    Inability: Not on file  . Transportation needs:    Medical: Not on file    Non-medical: Not on file  Tobacco Use  . Smoking status: Former Smoker    Packs/day: 1.00    Years: 50.00    Pack years: 50.00    Types: Cigarettes    Last attempt to quit: 03/20/2001    Years since quitting: 16.9  . Smokeless tobacco: Former Network engineer and Sexual Activity  . Alcohol use: No  . Drug use: No  . Sexual activity: Not on file  Lifestyle  . Physical activity:    Days per week: Not on file    Minutes per session: Not on file  . Stress: Not on file   Relationships  . Social connections:    Talks on phone: Not on file    Gets together: Not on file    Attends religious service: Not on file    Active member of club or organization: Not on file    Attends meetings of clubs or organizations: Not on file    Relationship status: Not on file  . Intimate partner violence:    Fear of current or ex partner: Not on file    Emotionally abused: Not on file    Physically abused: Not on file    Forced sexual activity: Not on file  Other Topics Concern  . Not on file  Social History Narrative  . Not on file  Family History: Family History  Problem Relation Age of Onset  . Heart disease Father        Heart attack  . Heart attack Father   . Diabetes Brother   . Cancer Neg Hx   . Stroke Neg Hx   . Hypertension Neg Hx     Review of Systems: Per history and physical Physical Exam: @VITALS2 @ General: No acute distress.  Responsive. Head:  Normocephalic.  Atraumatic. ENT:  EOMI.  Mucous membranes moist CV:  Regular rate. Pulmonary: Equal effort bilaterally.   Neurologic: Alert. Appropriate mood.    Studies:  Recent Labs    02/14/18 2107 02/15/18 0229  HGB 14.4 13.6  WBC 31.9* 33.5*  PLT 325 353    Recent Labs    02/14/18 2107 02/15/18 0229  NA 140 140  K 3.8 3.8  CL 105 105  CO2 24 17*  BUN 74* 66*  CREATININE 4.50* 3.86*  CALCIUM 8.1* 7.9*  GFRNONAA 11* 14*  GFRAA 13* 16*     Recent Labs    02/14/18 2107 02/15/18 0229  INR 4.88* 4.93*     Invalid input(s): ABG  Renal ultrasound images were reviewed.  In the bladder view, there is evidence of the catheter within the bladder.  Assessment: Mild urethral trauma with incomplete bladder emptying following dislocated catheter.  This has been replaced with adequate urine drainage  Plan: Currently, no significant urologic intervention needed.  Catheter is appropriately placed with urine draining appropriately.  Reconsult as  needed.    [phone]

## 2018-02-16 ENCOUNTER — Inpatient Hospital Stay (HOSPITAL_COMMUNITY): Payer: Medicare Other

## 2018-02-16 LAB — CBC
HCT: 38.3 % — ABNORMAL LOW (ref 39.0–52.0)
HEMOGLOBIN: 12.5 g/dL — AB (ref 13.0–17.0)
MCH: 31.2 pg (ref 26.0–34.0)
MCHC: 32.6 g/dL (ref 30.0–36.0)
MCV: 95.5 fL (ref 80.0–100.0)
Platelets: 384 10*3/uL (ref 150–400)
RBC: 4.01 MIL/uL — ABNORMAL LOW (ref 4.22–5.81)
RDW: 15.2 % (ref 11.5–15.5)
WBC: 31.8 10*3/uL — ABNORMAL HIGH (ref 4.0–10.5)
nRBC: 0 % (ref 0.0–0.2)

## 2018-02-16 LAB — BASIC METABOLIC PANEL
Anion gap: 11 (ref 5–15)
BUN: 56 mg/dL — ABNORMAL HIGH (ref 8–23)
CHLORIDE: 106 mmol/L (ref 98–111)
CO2: 23 mmol/L (ref 22–32)
Calcium: 7.8 mg/dL — ABNORMAL LOW (ref 8.9–10.3)
Creatinine, Ser: 1.89 mg/dL — ABNORMAL HIGH (ref 0.61–1.24)
GFR calc Af Amer: 37 mL/min — ABNORMAL LOW (ref >60)
GFR calc non Af Amer: 32 mL/min — ABNORMAL LOW (ref >60)
Glucose, Bld: 218 mg/dL — ABNORMAL HIGH (ref 70–99)
Potassium: 3.4 mmol/L — ABNORMAL LOW (ref 3.5–5.1)
SODIUM: 140 mmol/L (ref 135–145)

## 2018-02-16 LAB — VANCOMYCIN, RANDOM: Vancomycin Rm: 11

## 2018-02-16 LAB — URINE CULTURE: CULTURE: NO GROWTH

## 2018-02-16 LAB — GLUCOSE, CAPILLARY
Glucose-Capillary: 212 mg/dL — ABNORMAL HIGH (ref 70–99)
Glucose-Capillary: 130 mg/dL — ABNORMAL HIGH (ref 70–99)
Glucose-Capillary: 190 mg/dL — ABNORMAL HIGH (ref 70–99)
Glucose-Capillary: 107 mg/dL — ABNORMAL HIGH (ref 70–99)

## 2018-02-16 LAB — PROTIME-INR
INR: 4.44
PROTHROMBIN TIME: 41.6 s — AB (ref 11.4–15.2)

## 2018-02-16 MED ORDER — SODIUM CHLORIDE 0.9 % IV SOLN
1.0000 g | Freq: Two times a day (BID) | INTRAVENOUS | Status: DC
  Administered 2018-02-16 – 2018-02-17 (×3): 1 g via INTRAVENOUS
  Filled 2018-02-16 (×3): qty 1

## 2018-02-16 MED ORDER — IPRATROPIUM-ALBUTEROL 0.5-2.5 (3) MG/3ML IN SOLN
3.0000 mL | Freq: Three times a day (TID) | RESPIRATORY_TRACT | Status: DC
  Administered 2018-02-16 – 2018-02-25 (×25): 3 mL via RESPIRATORY_TRACT
  Filled 2018-02-16 (×29): qty 3

## 2018-02-16 MED ORDER — VANCOMYCIN HCL IN DEXTROSE 1-5 GM/200ML-% IV SOLN
1000.0000 mg | INTRAVENOUS | Status: DC
  Administered 2018-02-16: 1000 mg via INTRAVENOUS
  Filled 2018-02-16: qty 200

## 2018-02-16 MED ORDER — POTASSIUM CHLORIDE CRYS ER 20 MEQ PO TBCR
40.0000 meq | EXTENDED_RELEASE_TABLET | Freq: Once | ORAL | Status: AC
  Administered 2018-02-16: 40 meq via ORAL
  Filled 2018-02-16: qty 2

## 2018-02-16 NOTE — Progress Notes (Signed)
NAME:  Justin Garner, MRN:  [id number], DOB:  Mar 03, 1933, LOS: 2 ADMISSION DATE:  02/14/2018, CONSULTATION DATE: 02/15/2018 REFERRING MD: [place] Pink, CHIEF COMPLAINT: Pneumothorax  Brief History    Patient was admitted with shortness of breath History of congestive heart failure, history of lung cancer status post right upper lobectomy Came in from a nursing home with shortness of breath  History of present illness   Patient was admitted with shortness of breath Able to provide much of a history  Past Medical History  Coronary artery disease Lung cancer status post lobectomy, radiation treatment, chemotherapy Chronic kidney disease Chronic obstructive pulmonary disease Diabetes GERD Hypertension Atrial for relation History of PE  Significant Hospital Events     Consults:  PCCM  Procedures:  None  Significant Diagnostic Tests:  CT scan of the chest showing pneumothorax, multiple blebs Chest x-ray with pneumothorax  Micro Data:  Blood culture 02/15/2018 no growth Urine culture 02/06/2018  Antimicrobials:  None  Interim history/subjective:  Denies any chest pains, denies any shortness of breath at present  Objective   Blood pressure 106/70, pulse 60, temperature 97.8 F (36.6 C), temperature source Oral, resp. rate (!) 28, SpO2 96 %.        Intake/Output Summary (Last 24 hours) at 02/16/2018 1440 Last data filed at 02/16/2018 0900 Gross per 24 hour  Intake 1338.43 ml  Output 1220 ml  Net 118.43 ml   There were no vitals filed for this visit.  Examination: General: Elderly gentleman, does not appear to be in respiratory distress HENT: Dry oral mucosa Lungs: Decreased air movement bilaterally Cardiovascular: S1-S2 appreciated Abdomen: Bowel sounds appreciated Extremities: No clubbing, no edema Neuro: Alert and oriented x2  Resolved Hospital Problem list    Assessment & Plan:  Pneumothorax Pneumothorax remains stable Has multiple blebs No  mediastinal shift No invasive intervention at present  History of lung cancer Post lobectomy No signs of recurrence  Healthcare associated pneumonia Complete course of antibiotics On cefepime, vancomycin  Hypoxemic respiratory failure Stable at present Secondary to advanced COPD  Atrial fibrillation Stable  Acute kidney injury Avoid nephrotoxic's - Metabolic encephalopathy   Chronic systolic congestive heart failure -Diuretics and antihypertensives on hold at present  Coronary artery disease  Type 2 diabetes -Insulin coverage  Best practice:  Diet: Regular DVT prophylaxis: Enoxaparin Mobility: As tolerated Code Status: DNR Family Communication: No family at bedside Disposition:   Labs   CBC: Recent Labs  Lab 02/10/18 0540 02/11/18 0617 02/14/18 2107 02/15/18 0229 02/16/18 0339  WBC 11.5* 16.3* 31.9* 33.5* 31.8*  NEUTROABS 8.6* 11.7* 28.3*  --   --   HGB 12.6* 14.2 14.4 13.6 12.5*  HCT 38.9* 44.3 44.4 42.8 38.3*  MCV 96.3 97.4 98.2 99.8 95.5  PLT 229 260 325 353 366    Basic Metabolic Panel: Recent Labs  Lab 02/10/18 0540 02/11/18 0617 02/14/18 2107 02/15/18 0229 02/16/18 0339  NA 141 139 140 140 140  K 3.5 3.7 3.8 3.8 3.4*  CL 109 106 105 105 106  CO2 26 22 24  17* 23  GLUCOSE 128* 196* 66* 114* 218*  BUN 30* 29* 74* 66* 56*  CREATININE 1.25* 1.50* 4.50* 3.86* 1.89*  CALCIUM 8.3* 8.5* 8.1* 7.9* 7.8*  MG  --   --   --  1.8  --   PHOS 3.6 3.4  --   --   --    GFR: Estimated Creatinine Clearance: 37.4 mL/min (A) (by C-G formula based on SCr of 1.89 mg/dL (H)).  Recent Labs  Lab 02/11/18 0617 02/14/18 2107 02/14/18 2146 02/14/18 2354 02/15/18 0229 02/16/18 0339  WBC 16.3* 31.9*  --   --  33.5* 31.8*  LATICACIDVEN  --   --  1.52 1.23  --   --     Liver Function Tests: Recent Labs  Lab 02/10/18 0540 02/11/18 0617  ALBUMIN 2.4* 3.0*   No results for input(s): LIPASE, AMYLASE in the last 168 hours. No results for input(s):  AMMONIA in the last 168 hours.  ABG    Component Value Date/Time   PHART 7.419 02/15/2018 0050   PCO2ART 32.7 02/15/2018 0050   PO2ART 72.2 (L) 02/15/2018 0050   HCO3 20.9 02/15/2018 0050   TCO2 25 08/18/2016 1718   ACIDBASEDEF 2.5 (H) 02/15/2018 0050   O2SAT 94.0 02/15/2018 0050     Coagulation Profile: Recent Labs  Lab 02/10/18 0540 02/11/18 0617 02/14/18 2107 02/15/18 0229 02/16/18 0339  INR 2.41 2.16 4.88* 4.93* 4.44*    Cardiac Enzymes: No results for input(s): CKTOTAL, CKMB, CKMBINDEX, TROPONINI in the last 168 hours.  HbA1C: Hgb A1c MFr Bld  Date/Time Value Ref Range Status  02/07/2018 03:47 AM 6.0 (H) 4.8 - 5.6 % Final    Comment:    (NOTE) Pre diabetes:          5.7%-6.4% Diabetes:              >6.4% Glycemic control for   <7.0% adults with diabetes   08/18/2016 11:58 PM 8.9 (H) 4.8 - 5.6 % Final    Comment:    (NOTE)         Pre-diabetes: 5.7 - 6.4         Diabetes: >6.4         Glycemic control for adults with diabetes: <7.0     CBG: Recent Labs  Lab 02/15/18 1215 02/15/18 1612 02/15/18 2152 02/16/18 0740 02/16/18 1156  GLUCAP 209* 157* 168* 212* 130*    Review of Systems:   Review of Systems  Constitutional: Negative.   HENT: Negative.   Eyes: Negative.   Respiratory: Negative for cough and shortness of breath.   Cardiovascular: Negative.   Skin: Negative.    Past Medical History  He,  has a past medical history of ASCVD (arteriosclerotic cardiovascular disease), Cancer ([place]) (3614), Chronic systolic CHF (congestive heart failure) ([place]), CKD (chronic kidney disease), stage II, COPD (chronic obstructive pulmonary disease) ([place]), Coronary artery disease, Diabetes mellitus, GERD (gastroesophageal reflux disease), Hyperlipidemia, Hypertension, Leukocytosis (07/12/2015), PAF (paroxysmal atrial fibrillation) ([place]), Primary cancer of right upper lobe of lung ([place]) (06/30/2013), Pulmonary embolism ([place]), and Thyroid disease.   Surgical History     Past Surgical History:  Procedure Laterality Date  . CAROTID STENT    . CHOLECYSTECTOMY    . FIBEROPTIC BRONCHOSCOPY AND MEIASTINOSCOPY  06/07/2007  . HERNIA REPAIR    . INGUINAL HERNIA REPAIR  10/10/2010  . LEFT HEART CATHETERIZATION WITH CORONARY ANGIOGRAM N/A 08/19/2013   Procedure: LEFT HEART CATHETERIZATION WITH CORONARY ANGIOGRAM;  Surgeon: Jettie Booze, MD;  Location[facility] CATH LAB;  Service: Cardiovascular;  Laterality: N/A;  . R VATS,THORACOTOMY AND UPPER LOBECTOMY  08/02/2007     Social History   reports that he quit smoking about 16 years ago. His smoking use included cigarettes. He has a 50.00 pack-year smoking history. He has quit using smokeless tobacco. He reports that he does not drink alcohol or use drugs.   Family History   His family history includes Diabetes in his brother;  Heart attack in his father; Heart disease in his father. There is no history of Cancer, Stroke, or Hypertension.   Allergies No Known Allergies   Home Medications  Prior to Admission medications   Medication Sig Start Date End Date Taking? Authorizing Provider  acetaminophen (TYLENOL) 325 MG tablet Take 2 tablets (650 mg total) by mouth every 6 (six) hours as needed for mild pain (or Fever >/= 101). 02/11/18  Yes Nita Sells, MD  albuterol (PROVENTIL HFA;VENTOLIN HFA) 108 (90 Base) MCG/ACT inhaler Inhale 2 puffs into the lungs every 6 (six) hours as needed for wheezing or shortness of breath. 11/16/15  Yes Mikhail, [place], DO  Artificial Tear Ointment (DRY EYES OP) Place 1 drop into both eyes as needed (dry eye).   Yes [provider]  atorvastatin (LIPITOR) 20 MG tablet TAKE 1 TABLET BY MOUTH  DAILY Patient taking differently: Take 20 mg by mouth daily at 6 PM.  05/09/17  Yes Jettie Booze, MD  budesonide-formoterol [facility]) 80-4.5 MCG/ACT inhaler Inhale 2 puffs into the lungs every morning. 11/16/15  Yes Mikhail, Velta Addison, DO  diltiazem (CARDIZEM CD) 360 MG 24 hr  capsule Take 1 capsule (360 mg total) by mouth daily. 09/10/17  Yes Jettie Booze, MD  fish oil-omega-3 fatty acids 1000 MG capsule Take 1 g by mouth daily.    Yes [provider]  furosemide (LASIX) 20 MG tablet Take 2 tablets (40 mg total) by mouth daily. 06/27/17  Yes Simmons, Brittainy M, PA-C  glipiZIDE (GLUCOTROL) 10 MG tablet Take 10 mg by mouth daily before breakfast.   Yes [provider]  levothyroxine (SYNTHROID, LEVOTHROID) 112 MCG tablet Take 112 mcg by mouth daily.     Yes [provider]  lisinopril (PRINIVIL,ZESTRIL) 5 MG tablet Take 0.5 tablets (2.5 mg total) by mouth daily. 01/03/17  Yes Jettie Booze, MD  metoprolol tartrate (LOPRESSOR) 25 MG tablet TAKE ONE-HALF TABLET BY  MOUTH TWO TIMES DAILY Patient taking differently: Take 12.5 mg by mouth 2 (two) times daily.  05/09/17  Yes Jettie Booze, MD  mirtazapine (REMERON) 15 MG tablet Take 15 mg by mouth at bedtime.   Yes [provider]  nitroGLYCERIN (NITROSTAT) 0.4 MG SL tablet Place 1 tablet (0.4 mg total) under the tongue every 5 (five) minutes as needed for chest pain up to 3 doses 08/03/16  Yes Jettie Booze, MD  OLANZapine (ZYPREXA) 2.5 MG tablet Take 1 tablet (2.5 mg total) by mouth at bedtime. 02/11/18  Yes Nita Sells, MD  omeprazole (PRILOSEC) 20 MG capsule Take 20 mg by mouth daily.    Yes [provider]  sennosides-docusate sodium (SENOKOT-S) 8.6-50 MG tablet Take 1 tablet by mouth daily as needed for constipation.    Yes [provider]  warfarin (COUMADIN) 5 MG tablet TAKE AS DIRECTED BY  COUMADIN CLINIC Patient taking differently: Take 5 mg by mouth daily.  12/03/17  Yes Jettie Booze, MD

## 2018-02-16 NOTE — Evaluation (Signed)
Physical Therapy Evaluation Patient Details Name: Justin Garner MRN: [id number] DOB: 07-16-1932 Today's Date: 02/16/2018   History of Present Illness  Patient is an 84 y/o male admitted from SNF due to wekaness, blood in urine and SOB due to HCAP.  PMH includes a-fib, PE, HTN, CHF, COPD, DM.  Clinical Impression  Patient presents with decreased mobility due to decreased strength, decreased activity tolerance with elevated HR with ambulation, decreased balance and decreased safety awareness.  Feel he will need to return to SNF level rehab upon d/c.  PT to follow acutely.    Follow Up Recommendations Supervision/Assistance - 24 hour;SNF    Equipment Recommendations  Other (comment)(TBA)    Recommendations for Other Services       Precautions / Restrictions Precautions Precautions: Fall      Mobility  Bed Mobility Overal bed mobility: Needs Assistance Bed Mobility: Supine to Sit     Supine to sit: Supervision        Transfers Overall transfer level: Needs assistance Equipment used: Rolling walker (2 wheeled) Transfers: Sit to/from Stand Sit to Stand: Mod assist         General transfer comment: up from EOB lifting assist  Ambulation/Gait Ambulation/Gait assistance: Min assist;+2 safety/equipment(chair following) Gait Distance (Feet): 40 Feet Assistive device: Rolling walker (2 wheeled) Gait Pattern/deviations: Step-through pattern;Decreased stride length;Shuffle;Trunk flexed     General Gait Details: HR up to 130's with ambulation, SOB on O2, SpO2 88% with Ambulation  Stairs            Wheelchair Mobility    Modified Rankin (Stroke Patients Only)       Balance Overall balance assessment: Needs assistance Sitting-balance support: Feet supported Sitting balance-Leahy Scale: Fair     Standing balance support: Bilateral upper extremity supported Standing balance-Leahy Scale: Poor Standing balance comment: Reliant on BUE support and external  assist                             Pertinent Vitals/Pain Pain Assessment: No/denies pain    Home Living Family/patient expects to be discharged to:: Skilled nursing facility   Available Help at Discharge: Family;Available PRN/intermittently Type of Home: Mobile home Home Access: Stairs to enter Entrance Stairs-Rails: Right;Left;Can reach both Entrance Stairs-Number of Steps: 5 Home Layout: One level Home Equipment: Walker - 2 wheels;Cane - single point Additional Comments: pt lives alone but has supportive grandaughter that can check on him intermittently     Prior Function Level of Independence: Independent with assistive device(s)         Comments: prior to last admission ambulates with cane. Independent with ADLs.     Hand Dominance   Dominant Hand: Right    Extremity/Trunk Assessment   Upper Extremity Assessment Upper Extremity Assessment: Generalized weakness    Lower Extremity Assessment Lower Extremity Assessment: Generalized weakness    Cervical / Trunk Assessment Cervical / Trunk Assessment: Kyphotic  Communication   Communication: HOH  Cognition Arousal/Alertness: Awake/alert Behavior During Therapy: WFL for tasks assessed/performed Overall Cognitive Status: Impaired/Different from baseline Area of Impairment: Memory;Following commands;Safety/judgement                   Current Attention Level: Selective Memory: Decreased short-term memory Following Commands: Follows one step commands consistently;Follows one step commands with increased time Safety/Judgement: Decreased awareness of safety;Decreased awareness of deficits   Problem Solving: Slow processing;Requires verbal cues;Requires tactile cues General Comments: somewhat paranoid      General  Comments General comments (skin integrity, edema, etc.): Noted a-fib w/ LM761 at rest, up to 137 with ambulation    Exercises     Assessment/Plan    PT Assessment Patient needs  continued PT services  PT Problem List Decreased strength;Decreased mobility;Decreased safety awareness;Decreased activity tolerance;Decreased balance;Decreased knowledge of use of DME;Cardiopulmonary status limiting activity       PT Treatment Interventions DME instruction;Therapeutic activities;Gait training;Functional mobility training;Balance training;Therapeutic exercise;Patient/family education    PT Goals (Current goals can be found in the Care Plan section)  Acute Rehab PT Goals Patient Stated Goal: none stated PT Goal Formulation: With patient Time For Goal Achievement: 03/02/18 Potential to Achieve Goals: Fair    Frequency Min 3X/week   Barriers to discharge        Co-evaluation               AM-PAC PT "6 Clicks" Mobility  Outcome Measure Help needed turning from your back to your side while in a flat bed without using bedrails?: A Little Help needed moving from lying on your back to sitting on the side of a flat bed without using bedrails?: A Little Help needed moving to and from a bed to a chair (including a wheelchair)?: A Little Help needed standing up from a chair using your arms (e.g., wheelchair or bedside chair)?: A Little Help needed to walk in hospital room?: A Lot Help needed climbing 3-5 steps with a railing? : A Lot 6 Click Score: 16    End of Session Equipment Utilized During Treatment: Gait belt;Oxygen Activity Tolerance: Patient limited by fatigue;Treatment limited secondary to medical complications (Comment)(a-fib w/ RVR) Patient left: in chair;with call bell/phone within reach;with chair alarm set   PT Visit Diagnosis: Other abnormalities of gait and mobility (R26.89);Muscle weakness (generalized) (M62.81)    Time: 5183-4373 PT Time Calculation (min) (ACUTE ONLY): 20 min   Charges:   PT Evaluation $PT Eval Moderate Complexity: [place], [place] Acute Rehabilitation Services [phone] 02/16/2018   Reginia Naas 02/16/2018, 4:29 PM

## 2018-02-16 NOTE — Evaluation (Signed)
Clinical/Bedside Swallow Evaluation Patient Details  Name: Justin Garner MRN: [id number] Date of Birth: 09/19/1932  Today's Date: 02/16/2018 Time: SLP Start Time (ACUTE ONLY): 1400 SLP Stop Time (ACUTE ONLY): 1420 SLP Time Calculation (min) (ACUTE ONLY): 20 min  Past Medical History:  Past Medical History:  Diagnosis Date  . ASCVD (arteriosclerotic cardiovascular disease)   . [facility]) 2009   Upper lobectomy;radiation; chemotherapy  . Chronic systolic CHF (congestive heart failure) ([place])   . CKD (chronic kidney disease), stage II   . COPD (chronic obstructive pulmonary disease) ([place])   . Coronary artery disease   . Diabetes mellitus   . GERD (gastroesophageal reflux disease)   . Hyperlipidemia   . Hypertension   . Leukocytosis 07/12/2015  . PAF (paroxysmal atrial fibrillation) (H[facility])    Chronic warfarin therapy  . Primary cancer of right upper lobe of lung ([place]) 06/30/2013   RUL lobectomy 2009 with chest wall resection for superior sulcus tumor Postoperative radiation and chemotherapy; Dr Earlie Server Oncology monitor [phone]  . Pulmonary embolism (HCC)    PMH of  . Thyroid disease    Past Surgical History:  Past Surgical History:  Procedure Laterality Date  . CAROTID STENT    . CHOLECYSTECTOMY    . FIBEROPTIC BRONCHOSCOPY AND MEIASTINOSCOPY  06/07/2007  . HERNIA REPAIR    . INGUINAL HERNIA REPAIR  10/10/2010  . LEFT HEART CATHETERIZATION WITH CORONARY ANGIOGRAM N/A 08/19/2013   Procedure: LEFT HEART CATHETERIZATION WITH CORONARY ANGIOGRAM;  Surgeon: Jettie Booze, MD;  Location: [facility] CATH LAB;  Service: Cardiovascular;  Laterality: N/A;  . R VATS,THORACOTOMY AND UPPER LOBECTOMY  08/02/2007   HPI:  Patient is an 84 y.o. male with PMH: a-fib, PE, HTN, CHF, COPD, DM, who was admitted to hospital from SNF due to weakness, blood in urine and SOB due to HCAP.    Assessment / Plan / Recommendation Clinical Impression  Patient presents with an oropharyngeal swallow that  is [facility] and without overt s/s of aspiration or penetration with boluses of thin liquids (via cup sips and straw sips) and hard solids (graham cracker). Patient exhibited mild delay in mastication of hard solids, but no difficulty otherwise.  SLP Visit Diagnosis: Dysphagia, unspecified (R13.10)    Aspiration Risk  Mild aspiration risk    Diet Recommendation Dysphagia 3 (Mech soft);Thin liquid   Liquid Administration via: Cup;Straw Medication Administration: Whole meds with liquid Supervision: Patient able to self feed Compensations: Minimize environmental distractions;Slow rate;Small sips/bites Postural Changes: Seated upright at 90 degrees    Other  Recommendations Oral Care Recommendations: Oral care BID   Follow up Recommendations None      Frequency and Duration   N/A          Prognosis   N/A     Swallow Study   General Date of Onset: 02/14/18 HPI: Patient is an 84 y.o. male with PMH: a-fib, PE, HTN, CHF, COPD, DM, who was admitted to hospital from SNF due to weakness, blood in urine and SOB due to HCAP.  Type of Study: Bedside Swallow Evaluation Previous Swallow Assessment: N/A Diet Prior to this Study: Dysphagia 3 (soft);Thin liquids Temperature Spikes Noted: No History of Recent Intubation: No Behavior/Cognition: Alert;Cooperative;Confused;Requires cueing;Distractible Oral Cavity Assessment: Within Functional Limits Oral Care Completed by SLP: No Oral Cavity - Dentition: Dentures, bottom;Dentures, top Vision: Functional for self-feeding Self-Feeding Abilities: Able to feed self Patient Positioning: Upright in bed Baseline Vocal Quality: Normal Volitional Cough: Weak Volitional Swallow: Able to elicit    Oral/Motor/Sensory  Function Overall Oral Motor/Sensory Function: Within functional limits   Ice Chips Ice chips: Not tested   Thin Liquid Thin Liquid: Within functional limits Presentation: Cup;Straw;Self Fed Other Comments: No overt s/s of aspiration or  penetration observed with thin liquids.    Nectar Thick Nectar Thick Liquid: Not tested   Honey Thick Honey Thick Liquid: Not tested   Puree Puree: Within functional limits Presentation: Self Fed;Spoon   Solid     Solid: Impaired Presentation: Self Fed Oral Phase Impairments: Impaired mastication Other Comments: Mild delay in mastication       Sonia Baller, MA, CCC-SLP 02/16/18 5:13 PM

## 2018-02-16 NOTE — Progress Notes (Signed)
[facility] PROGRESS NOTE  Justin Garner XFG:[id number] DOB: [birth year] DOA: 02/14/2018  PCP: [name], MD  Brief History/IntervalSummary:  84 y.o. male with medical history significant of CAD, CKD stage III, COPD, chronic systolic CHF, type 2 diabetes, GERD, hypertension, hyperlipidemia, paroxysmal A. fib on Coumadin, history of lung cancer status post right upper lobe lobectomy in 2009 prior PE presenting to the hospital for evaluation of shortness of breath and blood in Foley catheter. Patient was recently admitted from February 04, 2018 to February 11, 2018 for shortness of breath, confusion, and rhinovirus infection.  He was discharged home on 2 L oxygen via nasal cannula.    He was found to require higher doses of oxygen.  Was noted to have urinary retention requiring a change in Foley catheter.  Chest x-ray showed small pneumothorax.  CT scan showed the same and also showed evidence for pneumonia.  Patient was hospitalized and started on broad-spectrum antibiotics.     Reason for Visit: Healthcare associated pneumonia.  Atrial fibrillation with RVR  Consultants: Urology.  Pulmonology  Procedures: Replacement of Foley catheter  Antibiotics: Vancomycin and cefepime  Subjective/Interval History: Patient noted to be irritable this morning.  Continues to have a cough which is dry.  Denies any chest pain.  Denies any shortness of breath.  No nausea or vomiting.    ROS: Denies any headaches  Objective:  Vital Signs  Vitals:   02/16/18 0400 02/16/18 0417 02/16/18 0500 02/16/18 0600  BP: 123/67  (!) 98/56 110/68  Pulse: (!) 57  (!) 128 (!) 103  Resp: 17  (!) 31 20  Temp:  98 F (36.7 C)    TempSrc:  Oral    SpO2:        Intake/Output Summary (Last 24 hours) at 02/16/2018 0806 Last data filed at 02/16/2018 0600 Gross per 24 hour  Intake 1395.87 ml  Output 1720 ml  Net -324.13 ml   There were no vitals filed for this visit.  General appearance: Awake  alert.  In no distress Resp: Coarse breath sounds bilaterally.  Crackles heard bilaterally mostly in the bases.  Few rhonchi.  No wheezing.  Still remains tachypneic though better compared to yesterday.   Cardio: S1-S2 is irregularly irregular.  Telemetry shows heart rate mostly in the 90-110 range.  Improved from yesterday. GI: Abdomen remains soft.  Nontender nondistended Extremities: Minimal edema bilateral lower extremities Neurologic: Awake alert.  Distracted.  No obvious focal neurological deficits.    Lab Results:  Data Reviewed: I have personally reviewed following labs and imaging studies  CBC: Recent Labs  Lab 02/10/18 0540 02/11/18 0617 02/14/18 2107 02/15/18 0229 02/16/18 0339  WBC 11.5* 16.3* 31.9* 33.5* 31.8*  NEUTROABS 8.6* 11.7* 28.3*  --   --   HGB 12.6* 14.2 14.4 13.6 12.5*  HCT 38.9* 44.3 44.4 42.8 38.3*  MCV 96.3 97.4 98.2 99.8 95.5  PLT 229 260 325 353 967    Basic Metabolic Panel: Recent Labs  Lab 02/10/18 0540 02/11/18 0617 02/14/18 2107 02/15/18 0229 02/16/18 0339  NA 141 139 140 140 140  K 3.5 3.7 3.8 3.8 3.4*  CL 109 106 105 105 106  CO2 26 22 24  17* 23  GLUCOSE 128* 196* 66* 114* 218*  BUN 30* 29* 74* 66* 56*  CREATININE 1.25* 1.50* 4.50* 3.86* 1.89*  CALCIUM 8.3* 8.5* 8.1* 7.9* 7.8*  MG  --   --   --  1.8  --   PHOS 3.6 3.4  --   --   --  GFR: Estimated Creatinine Clearance: 37.4 mL/min (A) (by C-G formula based on SCr of 1.89 mg/dL (H)).  Liver Function Tests: Recent Labs  Lab 02/10/18 0540 02/11/18 0617  ALBUMIN 2.4* 3.0*    Coagulation Profile: Recent Labs  Lab 02/10/18 0540 02/11/18 0617 02/14/18 2107 02/15/18 0229 02/16/18 0339  INR 2.41 2.16 4.88* 4.93* 4.44*    CBG: Recent Labs  Lab 02/15/18 0822 02/15/18 1215 02/15/18 1612 02/15/18 2152 02/16/18 0740  GLUCAP 193* 209* 157* 168* 212*     Radiology Studies: Ct Chest Wo Contrast  Result Date: 02/14/2018 CLINICAL DATA:  Shortness of breath.  Question pneumothorax on radiograph. EXAM: CT CHEST WITHOUT CONTRAST TECHNIQUE: Multidetector CT imaging of the chest was performed following the standard protocol without IV contrast. COMPARISON:  Radiograph earlier this day. Chest CT 04/26/2017 FINDINGS: Cardiovascular: Aortic atherosclerosis without aneurysm. Multi chamber cardiomegaly. There are coronary artery calcifications. Small amount of loculated pericardial fluid as before. Mediastinum/Nodes: Shotty mediastinal lymph nodes, for example left anterior carinal node measures 10 mm short axis. Limited assessment for hilar adenopathy in the absence of IV contrast. Surgical clips at the right hilum. No esophageal wall thickening. Lungs/Pleura: Advanced bullous emphysema, with large bulla in the left lung apex and right middle lobe. Left pneumothorax with small apical component, and more prominent anteromedial and inferior component. Pneumothorax likely roughly 10%. Left lower lobe consolidation may be atelectasis or pneumonia. Patchy and ground-glass opacities in the right lower lobe. Small right pleural effusion. Upper Abdomen: Motion artifacts. No definite acute finding. Musculoskeletal: Exaggerated thoracic kyphosis. Bones are under mineralized. There are no acute or suspicious osseous abnormalities. IMPRESSION: 1. CT confirms left pneumothorax, roughly 10%. Greatest pneumothorax component inferior and anteromedially. 2. Left lower lobe consolidation, suspicious for pneumonia. Patchy opacities at the right lung base suggest infectious etiology. 3. Advanced bullous emphysema with large bulla in the left lung apex and right middle lobe. Patchy and ground-glass opacities in the right lower lobe may be infectious or inflammatory. 4. Small bilateral pleural effusions. 5. Advanced bullous emphysema. 6. Coronary artery calcifications. Aortic Atherosclerosis (ICD10-I70.0) and Emphysema (ICD10-J43.9). These results were called by telephone at the time of  interpretation on 02/14/2018 at 11:13 pm to Dr. Isla Pence , who verbally acknowledged these results. Electronically Signed   By: Keith Rake M.D.   On: 02/14/2018 23:13   US Renal  Result Date: 02/15/2018 CLINICAL DATA:  Initial evaluation for acute urinary retention. EXAM: RENAL / URINARY TRACT ULTRASOUND COMPLETE COMPARISON:  None available. FINDINGS: Right Kidney: Renal measurements: 11.9 x 4.7 x 6.5 cm = volume: 190.4 mL . Echogenicity within normal limits. No mass or hydronephrosis visualized. Left Kidney: Renal measurements: 8.9 x 4.3 x 5.1 cm = volume: 11.3 mL. Left kidney demonstrates a somewhat lobulated contour with mildly heterogeneous echotexture. No hydronephrosis. 2.7 x 2.3 x 1.9 cm simple cyst present at the upper pole. Bladder: Foley catheter in place within the bladder. Bladder itself is mild to moderately distended and not decompressed. Enlarged prostate measuring 5.7 x 4.9 x 6.1 cm, with volume of 87.6 cc IMPRESSION: 1. Persistent mild-to-moderate bladder distension with Foley catheter in place. Finding raises the possibility for clogging or malpositioning of the Foley. Bedside check recommended. 2. Enlarged prostate. 3. No hydronephrosis. 4. 2.7 cm simple left renal cyst. Electronically Signed   By: Jeannine Boga M.D.   On: 02/15/2018 01:12   Dg Chest Port 1 View  Result Date: 02/15/2018 CLINICAL DATA:  Left pneumothorax. EXAM: PORTABLE CHEST 1 VIEW COMPARISON:  Radiographs  and CT yesterday. FINDINGS: Apical and lateral components of small left pneumothorax again visualized, the inferior component on CT (which was the largest component) is not visualized radiographically. Left apical blebs. Left lower lobe consolidation again seen. Increasing patchy right lung base opacity. Unchanged heart size and mediastinal contours. IMPRESSION: 1. Apical and lateral components of small left pneumothorax again seen, slightly increased from prior radiograph. The larger inferior  component is not visualized radiographically. 2. Left lower lobe consolidation again seen. Increasing patchy right lung base opacity. Electronically Signed   By: Keith Rake M.D.   On: 02/15/2018 03:42   Dg Chest Port 1 View  Result Date: 02/14/2018 CLINICAL DATA:  Dyspnea, history of right upper lobectomy for lung cancer EXAM: PORTABLE CHEST 1 VIEW COMPARISON:  02/11/2018 chest radiograph. FINDINGS: Stable cardiomediastinal silhouette with mild cardiomegaly. Stable postsurgical changes from partial posterior right third rib resection and right upper lobectomy. No right pneumothorax. Stable pleural-parenchymal scarring at right costophrenic angle. Bullous emphysema at the left lung apex with suggestion of new tiny apical left pneumothorax. No pulmonary edema. New left retrocardiac consolidation. IMPRESSION: 1. Bullous emphysema at the left lung apex with suggestion of new tiny apical left pneumothorax. No mediastinal shift. 2. New left retrocardiac consolidation, which could represent atelectasis, aspiration or pneumonia. 3. Stable postsurgical changes in the right hemithorax from right upper lobectomy. Critical Value/emergent results were called by telephone at the time of interpretation on 02/14/2018 at 9:27 pm to Dr. Isla Pence , who verbally acknowledged these results. Electronically Signed   By: Ilona Sorrel M.D.   On: 02/14/2018 21:29     Medications:  Scheduled: . diltiazem  60 mg Oral Q6H  . insulin aspart  0-9 Units Subcutaneous TID WC  . ipratropium-albuterol  3 mL Nebulization TID  . levothyroxine  56 mcg Intravenous Daily  . mouth rinse  15 mL Mouth Rinse BID  . metoprolol tartrate  12.5 mg Oral BID  . mometasone-formoterol  2 puff Inhalation BID   Continuous: . sodium chloride 50 mL/hr at 02/16/18 0600  . ceFEPime (MAXIPIME) IV 1 g (02/16/18 0806)  . vancomycin 1,000 mg (02/16/18 0703)   DZH:[id number] **OR** acetaminophen, albuterol,  bisacodyl    Assessment/Plan:  Healthcare associated pneumonia with acute hypoxic respiratory failure/pneumothorax/sepsis, present on admission Pulmonology has seen the patient.  No plans for any intervention.  Defer management to them.  Pneumothorax was small in size.  He does have multiple blebs and 1 of which must have ruptured.  Continue to wean down oxygen as tolerated.  Continue broad-spectrum antibiotics with vancomycin and cefepime.  MRSA PCR negative.  Blood cultures negative so far.  Okay to stop vancomycin.  He did have sepsis at admission due to leukocytosis and tachycardia and tachypnea.  Speech therapy to see for swallow evaluation.  Per nursing staff he is tolerating his diet well without any overt signs of aspiration.  We will change him to dysphagia 3 diet.  Lactic acid level was normal.  WBC remains elevated.  Chronic atrial fibrillation with RVR Patient with known history of atrial fibrillation.  Patient was placed back on his oral metoprolol and Cardizem.  Heart rate is better controlled.  Blood pressure is soft but stable.  Continue to monitor on telemetry.  Echocardiogram from June 2229 showed systolic CHF with a EF of about 35%-40%.  TSH was 4.5 on 11/19 with a normal free T4.  Chads 2 vascular score is 6.  Patient is on anticoagulation with warfarin which is on hold  due to supratherapeutic INR.  Acute kidney injury on chronic kidney disease stage III with hematuria/acute urinary retention Patient had a indwelling Foley catheter which was placed during his previous hospitalization.  Apparently the catheter was malpositioned resulting in retention.  This was the most likely reason for his renal failure.  Catheter was replaced in the emergency department.  Hematuria has resolved.  He has good urine output.  Urology has seen the patient.  He renal ultrasound did not show any hydronephrosis.  Renal function is improving.    Supratherapeutic INR Patient on warfarin for history of  atrial fibrillation.  It is elevated but stable.  No need to reverse as bleeding has subsided.  Will allow to drift down on its own.    Abnormal UA  Culture without any growth.  Abnormal UA was most likely due to hematuria.  No evidence for UTI.    QT prolongation on EKG EKG reviewed.  Hard to assess QT interval based on that EKG. Repeat EKG tomorrow morning.  Replace potassium.  Acute metabolic encephalopathy Multifactorial etiology.  Mentation appears to be improving.  Continue to monitor.  History of COPD Currently not wheezing.  Continue home medications.  Chronic systolic CHF Ejection fraction noted to be 35 to 40% based on echo from June 2018.  We will stop with IV fluids.  Holding his diuretics and lisinopril.  History of coronary artery disease Stable.  Patient denies any chest pain.  Continue to monitor.  Resume beta-blocker.  Essential hypertension Monitor blood pressures closely.  Noted to be soft overnight.  Type 2 diabetes mellitus, controlled HbA1c 6.0 on 02/07/2018.  Continue SSI.  Continue to monitor CBGs.  History of dementia with history of behavioral disturbances Stable currently.  Patient was started on Zyprexa during his previous hospitalization.  Currently on hold due to prolonged QT.  History of hypothyroidism Synthroid  Physical deconditioning PT and OT evaluation.  DVT Prophylaxis: Supratherapeutic INR    Code Status: DNR Family Communication: No family at bedside Disposition Plan: Management as outlined above.  Okay for transfer to telemetry.    LOS: 2 days   [facility] Pager [phone] 02/16/2018, 8:06 AM  If 7PM-7AM, please contact night-coverage at [email or web address], password [facility]

## 2018-02-16 NOTE — Progress Notes (Signed)
Pharmacy Antibiotic Note  Justin Garner is a 84 y.o. male admitted on 02/14/2018 with pneumonia.  Pharmacy has been consulted for cefepime and vancomycin dosing.  Plan: Increase Cefepime to 1 Gm IV q12h  VR= 11 mg/L, Scr improved to 1.89 Vancomycin 1 Gm IV q24h for est AUC = 467 Goal AUC = 400-500 F/u scr/cultures/levels     Temp (24hrs), Avg:97.8 F (36.6 C), Min:96.7 F (35.9 C), Max:98.4 F (36.9 C)  Recent Labs  Lab 02/10/18 0540 02/11/18 0617 02/14/18 2107 02/14/18 2146 02/14/18 2354 02/15/18 0229 02/16/18 0339  WBC 11.5* 16.3* 31.9*  --   --  33.5* 31.8*  CREATININE 1.25* 1.50* 4.50*  --   --  3.86* 1.89*  LATICACIDVEN  --   --   --  1.52 1.23  --   --   VANCORANDOM  --   --   --   --   --   --  11    Estimated Creatinine Clearance: 37.4 mL/min (A) (by C-G formula based on SCr of 1.89 mg/dL (H)).    No Known Allergies  Antimicrobials this admission: 11/28 cefepime >>  11/28 vancomycin >>   Dose adjustments this admission:   Microbiology results:  BCx:   UCx:    Sputum:    MRSA PCR:   Thank you for allowing pharmacy to be a part of this patient's care.  Dorrene German 02/16/2018 5:10 AM

## 2018-02-17 ENCOUNTER — Inpatient Hospital Stay (HOSPITAL_COMMUNITY): Payer: Medicare Other

## 2018-02-17 LAB — BASIC METABOLIC PANEL
Anion gap: 9 (ref 5–15)
BUN: 40 mg/dL — ABNORMAL HIGH (ref 8–23)
CO2: 25 mmol/L (ref 22–32)
Calcium: 7.7 mg/dL — ABNORMAL LOW (ref 8.9–10.3)
Chloride: 107 mmol/L (ref 98–111)
Creatinine, Ser: 1.24 mg/dL (ref 0.61–1.24)
GFR calc Af Amer: >60 mL/min (ref >60)
GFR, EST NON AFRICAN AMERICAN: 53 mL/min — AB (ref >60)
GLUCOSE: 114 mg/dL — AB (ref 70–99)
POTASSIUM: 3.6 mmol/L (ref 3.5–5.1)
Sodium: 141 mmol/L (ref 135–145)

## 2018-02-17 LAB — CBC
HCT: 39.7 % (ref 39.0–52.0)
Hemoglobin: 12.5 g/dL — ABNORMAL LOW (ref 13.0–17.0)
MCH: 31.3 pg (ref 26.0–34.0)
MCHC: 31.5 g/dL (ref 30.0–36.0)
MCV: 99.5 fL (ref 80.0–100.0)
Platelets: 402 10*3/uL — ABNORMAL HIGH (ref 150–400)
RBC: 3.99 MIL/uL — ABNORMAL LOW (ref 4.22–5.81)
RDW: 15.3 % (ref 11.5–15.5)
WBC: 18.8 10*3/uL — ABNORMAL HIGH (ref 4.0–10.5)
nRBC: 0 % (ref 0.0–0.2)

## 2018-02-17 LAB — GLUCOSE, CAPILLARY
Glucose-Capillary: 115 mg/dL — ABNORMAL HIGH (ref 70–99)
Glucose-Capillary: 111 mg/dL — ABNORMAL HIGH (ref 70–99)
Glucose-Capillary: 128 mg/dL — ABNORMAL HIGH (ref 70–99)
Glucose-Capillary: 127 mg/dL — ABNORMAL HIGH (ref 70–99)

## 2018-02-17 LAB — PROTIME-INR
INR: 3.72
Prothrombin Time: 36.2 seconds — ABNORMAL HIGH (ref 11.4–15.2)

## 2018-02-17 MED ORDER — POTASSIUM CHLORIDE CRYS ER 20 MEQ PO TBCR
40.0000 meq | EXTENDED_RELEASE_TABLET | Freq: Once | ORAL | Status: AC
  Administered 2018-02-17: 40 meq via ORAL
  Filled 2018-02-17: qty 2

## 2018-02-17 MED ORDER — DILTIAZEM HCL ER COATED BEADS 180 MG PO CP24
300.0000 mg | ORAL_CAPSULE | Freq: Every day | ORAL | Status: DC
  Administered 2018-02-18 – 2018-02-25 (×7): 300 mg via ORAL
  Filled 2018-02-17 (×9): qty 1

## 2018-02-17 MED ORDER — POTASSIUM CHLORIDE CRYS ER 20 MEQ PO TBCR: 40.0000 meq | EXTENDED_RELEASE_TABLET | Freq: Once | ORAL | Status: AC

## 2018-02-17 MED ORDER — LEVOTHYROXINE SODIUM 112 MCG PO TABS
112.0000 ug | ORAL_TABLET | Freq: Every day | ORAL | Status: DC
  Administered 2018-02-17 – 2018-02-25 (×8): 112 ug via ORAL
  Filled 2018-02-17 (×9): qty 1

## 2018-02-17 MED ORDER — SODIUM CHLORIDE 0.9 % IV SOLN
1.0000 g | Freq: Three times a day (TID) | INTRAVENOUS | Status: DC
  Administered 2018-02-17 – 2018-02-21 (×12): 1 g via INTRAVENOUS
  Filled 2018-02-17 (×13): qty 1

## 2018-02-17 NOTE — Progress Notes (Signed)
Pharmacy Antibiotic Note  Justin Garner is a 84 y.o. male admitted on 02/14/2018 with pneumonia.  Pharmacy has been consulted for cefepime dosing. 02/17/2018:  Day#3 antibiotics  Renal fxn returned to baseline, est CrCl>39ml/min  Afebrile  WBC elevated, but trending down  Cx data negative to date  Plan: Increase Cefepime to 1 Gm IV q8h  No further dose adjustments anticipated- pharmacy to sign off. Will monitor peripherally. Please re-consult if needed.       Temp (24hrs), Avg:97.7 F (36.5 C), Min:97.6 F (36.4 C), Max:97.8 F (36.6 C)  Recent Labs  Lab 02/11/18 0617 02/14/18 2107 02/14/18 2146 02/14/18 2354 02/15/18 0229 02/16/18 0339 02/17/18 0539  WBC 16.3* 31.9*  --   --  33.5* 31.8* 18.8*  CREATININE 1.50* 4.50*  --   --  3.86* 1.89* 1.24  LATICACIDVEN  --   --  1.52 1.23  --   --   --   VANCORANDOM  --   --   --   --   --  11  --     Estimated Creatinine Clearance: 56.9 mL/min (by C-G formula based on SCr of 1.24 mg/dL).    No Known Allergies  Antimicrobials this admission: 11/28 cefepime >>  11/28 vancomycin >> 11/30  Dose adjustments this admission: 11/30: Increase Cefepime to q12h 12/1 Increase Cefepime to q8h  Microbiology results: 11/28 BCx: ngtd 11/28 UCx: NGF 11/28 resp panel: negative 11/28 MRSA PCR: negative  Thank you for allowing pharmacy to be a part of this patient's care.  Biagio Borg 02/17/2018 10:47 AM

## 2018-02-17 NOTE — Progress Notes (Signed)
[facility] PROGRESS NOTE  Justin Garner BZJ:[id number] DOB: [birth year] DOA: 02/14/2018  PCP: [name], MD  Brief History/IntervalSummary:  84 y.o. male with medical history significant of CAD, CKD stage III, COPD, chronic systolic CHF, type 2 diabetes, GERD, hypertension, hyperlipidemia, paroxysmal A. fib on Coumadin, history of lung cancer status post right upper lobe lobectomy in 2009 prior PE presenting to the hospital for evaluation of shortness of breath and blood in Foley catheter. Patient was recently admitted from February 04, 2018 to February 11, 2018 for shortness of breath, confusion, and rhinovirus infection.  He was discharged home on 2 L oxygen via nasal cannula.    He was found to require higher doses of oxygen.  Was noted to have urinary retention requiring a change in Foley catheter.  Chest x-ray showed small pneumothorax.  CT scan showed the same and also showed evidence for pneumonia.  Patient was hospitalized and started on broad-spectrum antibiotics.     Reason for Visit: Healthcare associated pneumonia.  Atrial fibrillation with RVR  Consultants: Urology.  Pulmonology  Procedures: Replacement of Foley catheter  Antibiotics: Vancomycin and cefepime Vancomycin was discontinued on 11/30  Subjective/Interval History: Patient states that he is feeling better.  Occasional cough.  Denies any chest pain.  No nausea or vomiting.    ROS: Denies any headaches  Objective:  Vital Signs  Vitals:   02/17/18 0550 02/17/18 0804 02/17/18 0805 02/17/18 0850  BP: (!) 127/116   111/80  Pulse: (!) 56   81  Resp: 18   18  Temp: 97.6 F (36.4 C)     TempSrc: Oral     SpO2: 96% (!) 87% 95% 96%    Intake/Output Summary (Last 24 hours) at 02/17/2018 1301 Last data filed at 02/17/2018 0709 Gross per 24 hour  Intake 160 ml  Output 1300 ml  Net -1140 ml   There were no vitals filed for this visit.  General appearance: Awake alert.  In no distress Resp:  Coarse breath sounds heard bilaterally.  Few crackles at the bases.  No rhonchi today.  No wheezing.  Tachypnea appears to have improved. Cardio: S1-S2 remains irregularly irregular.  Telemetry shows heart rate was in the 90s to 100 most of yesterday.  This morning went up to 120.  Improved subsequently. GI: Abdomen remains soft.  Nontender nondistended Extremities: Minimal edema bilateral lower extremities Neurologic: Awake alert.  Distracted.  No focal neurological deficits.  Lab Results:  Data Reviewed: I have personally reviewed following labs and imaging studies  CBC: Recent Labs  Lab 02/11/18 0617 02/14/18 2107 02/15/18 0229 02/16/18 0339 02/17/18 0539  WBC 16.3* 31.9* 33.5* 31.8* 18.8*  NEUTROABS 11.7* 28.3*  --   --   --   HGB 14.2 14.4 13.6 12.5* 12.5*  HCT 44.3 44.4 42.8 38.3* 39.7  MCV 97.4 98.2 99.8 95.5 99.5  PLT 260 325 353 384 402*    Basic Metabolic Panel: Recent Labs  Lab 02/11/18 0617 02/14/18 2107 02/15/18 0229 02/16/18 0339 02/17/18 0539  NA 139 140 140 140 141  K 3.7 3.8 3.8 3.4* 3.6  CL 106 105 105 106 107  CO2 22 24 17* 23 25  GLUCOSE 196* 66* 114* 218* 114*  BUN 29* 74* 66* 56* 40*  CREATININE 1.50* 4.50* 3.86* 1.89* 1.24  CALCIUM 8.5* 8.1* 7.9* 7.8* 7.7*  MG  --   --  1.8  --   --   PHOS 3.4  --   --   --   --  GFR: Estimated Creatinine Clearance: 56.9 mL/min (by C-G formula based on SCr of 1.24 mg/dL).  Liver Function Tests: Recent Labs  Lab 02/11/18 0617  ALBUMIN 3.0*    Coagulation Profile: Recent Labs  Lab 02/11/18 0617 02/14/18 2107 02/15/18 0229 02/16/18 0339 02/17/18 0539  INR 2.16 4.88* 4.93* 4.44* 3.72    CBG: Recent Labs  Lab 02/16/18 1156 02/16/18 1654 02/16/18 2159 02/17/18 0729 02/17/18 1220  GLUCAP 130* 190* 107* 115* 111*     Radiology Studies: Dg Chest Port 1 View  Result Date: 02/17/2018 CLINICAL DATA:  Pneumothorax. EXAM: PORTABLE CHEST 1 VIEW COMPARISON:  02/16/2018 FINDINGS: The patient  is rotated to the left. The cardiomediastinal silhouette is unchanged. A small left apical pneumothorax is unchanged. Bibasilar lung opacities are unchanged, likely with underlying small pleural effusions. IMPRESSION: 1. Unchanged small left apical pneumothorax. 2. Unchanged bibasilar pneumonia or atelectasis with small pleural effusions. Electronically Signed   By: Logan Bores M.D.   On: 02/17/2018 07:18   Dg Chest Port 1 View  Result Date: 02/16/2018 CLINICAL DATA:  Shortness of breath with history of congestive heart failure. History of lung cancer post right upper lobectomy. EXAM: PORTABLE CHEST 1 VIEW COMPARISON:  02/15/2018 FINDINGS: Lungs are adequately inflated and demonstrate a stable small left apical pneumothorax. Persistent bibasilar opacification likely small effusions with associated atelectasis. Infection in the lung bases is possible. Cardiomediastinal silhouette and remainder of the exam is unchanged. IMPRESSION: Persistent bibasilar opacification which may be due to small effusions with atelectasis, although infection in the lung bases is possible. Stable small left apical pneumothorax. Electronically Signed   By: Marin Olp M.D.   On: 02/16/2018 15:37     Medications:  Scheduled: . diltiazem  60 mg Oral Q6H  . insulin aspart  0-9 Units Subcutaneous TID WC  . ipratropium-albuterol  3 mL Nebulization TID  . levothyroxine  112 mcg Oral Q0600  . mouth rinse  15 mL Mouth Rinse BID  . metoprolol tartrate  12.5 mg Oral BID  . mometasone-formoterol  2 puff Inhalation BID   Continuous: . ceFEPime (MAXIPIME) IV     SNK:[id number] **OR** acetaminophen, albuterol, bisacodyl    Assessment/Plan:  Healthcare associated pneumonia with acute hypoxic respiratory failure/pneumothorax/sepsis, present on admission Pulmonology has seen the patient.  No plans for any intervention. Pneumothorax was small in size.  Noted to be stable as per chest x-ray report from this morning.  He does  have multiple blebs and 1 of which must have ruptured.  Patient is saturating well.  Continue to wean down oxygen as tolerated.  Patient was started on vancomycin and cefepime for pneumonia. MRSA PCR negative.  Blood cultures negative so far.  Vancomycin was discontinued on 11/30.  Seen by speech therapy and is on a dysphagia 3 diet.  He did have sepsis at admission due to leukocytosis and tachycardia and tachypnea.  Lactic acid level was normal.  WBC much improved today.  He remains afebrile.  Chronic atrial fibrillation with RVR Patient with known history of atrial fibrillation.  Patient was placed back on his oral metoprolol and Cardizem.  Plan to transition him back to long-acting Cardizem tomorrow.  Heart rate is better controlled. BP is stable. Continue to monitor on telemetry.  Echocardiogram from June 0240 showed systolic CHF with a EF of about 35%-40%.  TSH was 4.5 on 11/19 with a normal free T4.  Chads 2 vascular score is 6.  Patient is on anticoagulation with warfarin which is on hold due to  supratherapeutic INR.  Acute kidney injury on chronic kidney disease stage III with hematuria/acute urinary retention Patient had a indwelling Foley catheter which was placed during his previous hospitalization.  Apparently the catheter was malpositioned resulting in retention.  This was the most likely reason for his renal failure.  Catheter was replaced in the emergency department.  Hematuria has resolved.  Patient continues to have good urine output.  Creatinine has improved and back to baseline.  Renal ultrasound did not show any hydronephrosis.  Patient was seen by urology.    Supratherapeutic INR Patient on warfarin for history of atrial fibrillation.  Patient noted to have supratherapeutic INR.  Allowing INR to drift down gradually.  INR is 3.72 today.    Abnormal UA  Culture without any growth.  Abnormal UA was most likely due to hematuria.  No evidence for UTI.    QT prolongation on EKG EKG  reviewed.  Hard to assess QT interval based on that EKG. is also poor quality.  Potassium 3.6 today.  We will give him additional dose today.   Acute metabolic encephalopathy Multifactorial etiology.  Mentation has improved.  Possibly close to baseline.  History of COPD Currently not wheezing.  Continue home medications.  Chronic systolic CHF Ejection fraction noted to be 35 to 40% based on echo from June 2018.  Holding his diuretics and lisinopril.  History of coronary artery disease Stable.  Continue current medications.  Essential hypertension Blood pressure soft but stable.  Type 2 diabetes mellitus, controlled HbA1c 6.0 on 02/07/2018.  Continue SSI.  Continue to monitor CBGs.  CBGs are reasonably well controlled.  History of dementia with history of behavioral disturbances Stable currently.  Patient was started on Zyprexa during his previous hospitalization.  Currently on hold due to prolonged QT.  History of hypothyroidism Synthroid  Physical deconditioning PT and OT evaluation.  DVT Prophylaxis: Supratherapeutic INR    Code Status: DNR Family Communication: No family at bedside Disposition Plan: Management as outlined above.  Anticipate transition to oral antibiotics tomorrow.  Will need skilled nursing facility at discharge.    LOS: 3 days   [facility] Pager [phone] 02/17/2018, 1:01 PM  If 7PM-7AM, please contact night-coverage at [email or web address], password [facility]

## 2018-02-17 NOTE — Plan of Care (Signed)
°  Problem: Education: °Goal: Knowledge of General Education information will improve °Description: Including pain rating scale, medication(s)/side effects and non-pharmacologic comfort measures °Outcome: Progressing °  °Problem: Health Behavior/Discharge Planning: °Goal: Ability to manage health-related needs will improve °Outcome: Progressing °  °Problem: Clinical Measurements: °Goal: Ability to maintain clinical measurements within normal limits will improve °Outcome: Progressing °Goal: Will remain free from infection °Outcome: Progressing °Goal: Cardiovascular complication will be avoided °Outcome: Progressing °  °Problem: Activity: °Goal: Risk for activity intolerance will decrease °Outcome: Progressing °  °Problem: Nutrition: °Goal: Adequate nutrition will be maintained °Outcome: Progressing °  °

## 2018-02-17 NOTE — Progress Notes (Signed)
NAME:  Justin Garner, MRN:  [id number], DOB:  1932-07-10, LOS: 3 ADMISSION DATE:  02/14/2018, CONSULTATION DATE: 02/15/2018 REFERRING MD: [place] Pink, CHIEF COMPLAINT: Pneumothorax  Brief History    Patient was admitted with shortness of breath History of congestive heart failure, history of lung cancer status post right upper lobectomy Came in from a nursing home with shortness of breath  History of present illness   Patient was admitted with shortness of breath Able to provide much of a history  Past Medical History  Coronary artery disease Lung cancer status post lobectomy, radiation treatment, chemotherapy Chronic kidney disease Chronic obstructive pulmonary disease Diabetes GERD Hypertension Atrial for relation History of PE  Significant Hospital Events     Consults:  PCCM  Procedures:  None  Significant Diagnostic Tests:  CT scan of the chest showing pneumothorax, multiple blebs Chest x-ray with pneumothorax  Micro Data:  Blood culture 02/15/2018 no growth Urine culture 02/06/2018  Antimicrobials:  Azithromycin 11/20-1123 Cefepime 11/28>> Ceftriaxone 11/20- 11/23 Vancomycin 11/29   Interim history/subjective:  Denies any chest pains, denies any shortness of breath at present  Objective   Blood pressure 111/80, pulse 81, temperature 97.6 F (36.4 C), temperature source Oral, resp. rate 18, SpO2 (!) 88 %.        Intake/Output Summary (Last 24 hours) at 02/17/2018 1356 Last data filed at 02/17/2018 1351 Gross per 24 hour  Intake 160 ml  Output 1925 ml  Net -1765 ml   There were no vitals filed for this visit.  Examination: General: Elderly gentleman, not in distress HENT: Dry oral mucosa Lungs: Creased air movement bilaterally, no rales Cardiovascular: S2 appreciated Abdomen: Bowel sounds appreciated Extremities: No clubbing, no edema  Resolved Hospital Problem list    Assessment & Plan:  Pneumothorax Pneumothorax remains  stable Remains stable on chest x-ray  History of lung cancer No signs of recurrence of lung cancer on radiological studies Post lobectomy for lung cancer  Healthcare associated pneumonia Complete course of antibiotics On cefepime, vancomycin  Hypoxemic respiratory failure Stable at present Secondary to advanced COPD  Leukocytosis -better  Atrial fibrillation Stable  Acute kidney injury Avoid nephrotoxic's  Chronic systolic congestive heart failure -Diuretics and antihypertensives on hold at present  Coronary artery disease  Type 2 diabetes -Insulin coverage  Best practice:  Diet: Regular DVT prophylaxis: Enoxaparin Mobility: As tolerated Code Status: DNR Family Communication: No family at bedside Disposition:   Labs   CBC: Recent Labs  Lab 02/11/18 0617 02/14/18 2107 02/15/18 0229 02/16/18 0339 02/17/18 0539  WBC 16.3* 31.9* 33.5* 31.8* 18.8*  NEUTROABS 11.7* 28.3*  --   --   --   HGB 14.2 14.4 13.6 12.5* 12.5*  HCT 44.3 44.4 42.8 38.3* 39.7  MCV 97.4 98.2 99.8 95.5 99.5  PLT 260 325 353 384 402*    Basic Metabolic Panel: Recent Labs  Lab 02/11/18 0617 02/14/18 2107 02/15/18 0229 02/16/18 0339 02/17/18 0539  NA 139 140 140 140 141  K 3.7 3.8 3.8 3.4* 3.6  CL 106 105 105 106 107  CO2 22 24 17* 23 25  GLUCOSE 196* 66* 114* 218* 114*  BUN 29* 74* 66* 56* 40*  CREATININE 1.50* 4.50* 3.86* 1.89* 1.24  CALCIUM 8.5* 8.1* 7.9* 7.8* 7.7*  MG  --   --  1.8  --   --   PHOS 3.4  --   --   --   --    GFR: Estimated Creatinine Clearance: 56.9 mL/min (by C-G formula based  on SCr of 1.24 mg/dL). Recent Labs  Lab 02/14/18 2107 02/14/18 2146 02/14/18 2354 02/15/18 0229 02/16/18 0339 02/17/18 0539  WBC 31.9*  --   --  33.5* 31.8* 18.8*  LATICACIDVEN  --  1.52 1.23  --   --   --     Liver Function Tests: Recent Labs  Lab 02/11/18 0617  ALBUMIN 3.0*   No results for input(s): LIPASE, AMYLASE in the last 168 hours. No results for  input(s): AMMONIA in the last 168 hours.  ABG    Component Value Date/Time   PHART 7.419 02/15/2018 0050   PCO2ART 32.7 02/15/2018 0050   PO2ART 72.2 (L) 02/15/2018 0050   HCO3 20.9 02/15/2018 0050   TCO2 25 08/18/2016 1718   ACIDBASEDEF 2.5 (H) 02/15/2018 0050   O2SAT 94.0 02/15/2018 0050     Coagulation Profile: Recent Labs  Lab 02/11/18 0617 02/14/18 2107 02/15/18 0229 02/16/18 0339 02/17/18 0539  INR 2.16 4.88* 4.93* 4.44* 3.72    Cardiac Enzymes: No results for input(s): CKTOTAL, CKMB, CKMBINDEX, TROPONINI in the last 168 hours.  HbA1C: Hgb A1c MFr Bld  Date/Time Value Ref Range Status  02/07/2018 03:47 AM 6.0 (H) 4.8 - 5.6 % Final    Comment:    (NOTE) Pre diabetes:          5.7%-6.4% Diabetes:              >6.4% Glycemic control for   <7.0% adults with diabetes   08/18/2016 11:58 PM 8.9 (H) 4.8 - 5.6 % Final    Comment:    (NOTE)         Pre-diabetes: 5.7 - 6.4         Diabetes: >6.4         Glycemic control for adults with diabetes: <7.0     CBG: Recent Labs  Lab 02/16/18 1156 02/16/18 1654 02/16/18 2159 02/17/18 0729 02/17/18 1220  GLUCAP 130* 190* 107* 115* 111*    Review of Systems:   Review of Systems  Constitutional: Negative.   HENT: Negative.   Eyes: Negative.   Respiratory: Negative for cough and shortness of breath.   Cardiovascular: Negative.   Skin: Negative.    Past Medical History  He,  has a past medical history of ASCVD (arteriosclerotic cardiovascular disease), Cancer ([place]) (1610), Chronic systolic CHF (congestive heart failure) ([place]), CKD (chronic kidney disease), stage II, COPD (chronic obstructive pulmonary disease) ([place]), Coronary artery disease, Diabetes mellitus, GERD (gastroesophageal reflux disease), Hyperlipidemia, Hypertension, Leukocytosis (07/12/2015), PAF (paroxysmal atrial fibrillation) ([place]), Primary cancer of right upper lobe of lung ([place]) (06/30/2013), Pulmonary embolism ([place]), and Thyroid disease.    Surgical History    Past Surgical History:  Procedure Laterality Date  . CAROTID STENT    . CHOLECYSTECTOMY    . FIBEROPTIC BRONCHOSCOPY AND MEIASTINOSCOPY  06/07/2007  . HERNIA REPAIR    . INGUINAL HERNIA REPAIR  10/10/2010  . LEFT HEART CATHETERIZATION WITH CORONARY ANGIOGRAM N/A 08/19/2013   Procedure: LEFT HEART CATHETERIZATION WITH CORONARY ANGIOGRAM;  Surgeon: Jettie Booze, MD;  Location[facility] CATH LAB;  Service: Cardiovascular;  Laterality: N/A;  . R VATS,THORACOTOMY AND UPPER LOBECTOMY  08/02/2007     Social History   reports that he quit smoking about 16 years ago. His smoking use included cigarettes. He has a 50.00 pack-year smoking history. He has quit using smokeless tobacco. He reports that he does not drink alcohol or use drugs.   Family History   His family history includes Diabetes in  his brother; Heart attack in his father; Heart disease in his father. There is no history of Cancer, Stroke, or Hypertension.   Allergies No Known Allergies

## 2018-02-17 NOTE — Progress Notes (Signed)
PHARMACIST - PHYSICIAN COMMUNICATION  DR:  Mary[place] Pink  CONCERNING: IV to Oral Route Change Policy  RECOMMENDATION: This patient is receiving levothyroxine by the intravenous route.  Based on criteria approved by the Pharmacy and Therapeutics Committee, the intravenous medication(s) is/are being converted to the equivalent oral dose form(s).   DESCRIPTION: These criteria include:  The patient is eating (either orally or via tube) and/or has been taking other orally administered medications for a least 24 hours  The patient has no evidence of active gastrointestinal bleeding or impaired GI absorption (gastrectomy, short bowel, patient on TNA or NPO).  If you have questions about this conversion, please contact the Pharmacy Department  []   [phone] )  [facility] []   [phone] )  [facility] []   [phone] )  [facility] []   [phone] )  [facility] [x]   [phone] )  [place], [place], [facility] 02/17/2018 11:03 AM

## 2018-02-18 ENCOUNTER — Inpatient Hospital Stay (HOSPITAL_COMMUNITY): Payer: Medicare Other

## 2018-02-18 DIAGNOSIS — J9312 Secondary spontaneous pneumothorax: Secondary | ICD-10-CM

## 2018-02-18 LAB — GLUCOSE, CAPILLARY
Glucose-Capillary: 154 mg/dL — ABNORMAL HIGH (ref 70–99)
Glucose-Capillary: 97 mg/dL (ref 70–99)
Glucose-Capillary: 103 mg/dL — ABNORMAL HIGH (ref 70–99)
Glucose-Capillary: 134 mg/dL — ABNORMAL HIGH (ref 70–99)

## 2018-02-18 LAB — CBC
HCT: 42.7 % (ref 39.0–52.0)
Hemoglobin: 13.4 g/dL (ref 13.0–17.0)
MCH: 30.8 pg (ref 26.0–34.0)
MCHC: 31.4 g/dL (ref 30.0–36.0)
MCV: 98.2 fL (ref 80.0–100.0)
Platelets: 459 10*3/uL — ABNORMAL HIGH (ref 150–400)
RBC: 4.35 MIL/uL (ref 4.22–5.81)
RDW: 14.8 % (ref 11.5–15.5)
WBC: 15.5 10*3/uL — ABNORMAL HIGH (ref 4.0–10.5)
nRBC: 0 % (ref 0.0–0.2)

## 2018-02-18 LAB — BASIC METABOLIC PANEL
Anion gap: 10 (ref 5–15)
BUN: 30 mg/dL — ABNORMAL HIGH (ref 8–23)
CO2: 25 mmol/L (ref 22–32)
CREATININE: 1.04 mg/dL (ref 0.61–1.24)
Calcium: 8 mg/dL — ABNORMAL LOW (ref 8.9–10.3)
Chloride: 107 mmol/L (ref 98–111)
GFR calc Af Amer: >60 mL/min (ref >60)
GFR calc non Af Amer: >60 mL/min (ref >60)
Glucose, Bld: 156 mg/dL — ABNORMAL HIGH (ref 70–99)
Potassium: 3.7 mmol/L (ref 3.5–5.1)
Sodium: 142 mmol/L (ref 135–145)

## 2018-02-18 LAB — PROTIME-INR
INR: 2.43
Prothrombin Time: 26.1 seconds — ABNORMAL HIGH (ref 11.4–15.2)

## 2018-02-18 MED ORDER — WARFARIN - PHARMACIST DOSING INPATIENT
Freq: Every day | Status: DC
  Administered 2018-02-18 – 2018-02-24 (×2)

## 2018-02-18 MED ORDER — WARFARIN SODIUM 5 MG PO TABS
5.0000 mg | ORAL_TABLET | Freq: Once | ORAL | Status: AC
  Administered 2018-02-18: 5 mg via ORAL
  Filled 2018-02-18: qty 1

## 2018-02-18 MED ORDER — LORAZEPAM 2 MG/ML IJ SOLN
0.5000 mg | Freq: Once | INTRAMUSCULAR | Status: AC
  Administered 2018-02-18: 0.5 mg via INTRAVENOUS
  Filled 2018-02-18: qty 1

## 2018-02-18 NOTE — Progress Notes (Signed)
[facility] PROGRESS NOTE  Justin Garner VEH:[id number] DOB: [birth year] DOA: 02/14/2018  PCP: [name], MD  Brief History/IntervalSummary:  84 y.o. male with medical history significant of CAD, CKD stage III, COPD, chronic systolic CHF, type 2 diabetes, GERD, hypertension, hyperlipidemia, paroxysmal A. fib on Coumadin, history of lung cancer status post right upper lobe lobectomy in 2009 prior PE presenting to the hospital for evaluation of shortness of breath and blood in Foley catheter. Patient was recently admitted from February 04, 2018 to February 11, 2018 for shortness of breath, confusion, and rhinovirus infection.  He was discharged home on 2 L oxygen via nasal cannula.    He was found to require higher doses of oxygen.  Was noted to have urinary retention requiring a change in Foley catheter.  Chest x-ray showed small pneumothorax.  CT scan showed the same and also showed evidence for pneumonia.  Patient was hospitalized and started on broad-spectrum antibiotics.     Reason for Visit: Healthcare associated pneumonia.  Atrial fibrillation with RVR  Consultants: Urology.  Pulmonology  Procedures: Replacement of Foley catheter  Antibiotics: Vancomycin and cefepime Vancomycin was discontinued on 11/30  Subjective/Interval History: Patient noted to be more tachypneic this morning.  Patient granddaughter is at the bedside.  Patient denies any chest pain.    ROS: Denies any headaches  Objective:  Vital Signs  Vitals:   02/17/18 2017 02/17/18 2153 02/18/18 0347 02/18/18 0832  BP:   133/88   Pulse:  62    Resp:      Temp:      TempSrc:      SpO2: 95%   93%  Weight:      Height:        Intake/Output Summary (Last 24 hours) at 02/18/2018 1310 Last data filed at 02/18/2018 0500 Gross per 24 hour  Intake 251.83 ml  Output 1975 ml  Net -1723.17 ml   Filed Weights   02/17/18 1100  Weight: 99.6 kg    General appearance:  In no distress.  Seems to be  little bit more lethargic today. Resp: Coarse breath sounds.  Upper airway sounds are heard.  No wheezing or rhonchi. Cardio: S1-S2 irregularly irregular.  Telemetry shows heart rate in the 120s.  Atrial fibrillation. GI: Abdomen remains soft.  Nontender nondistended Extremities: Minimal edema bilateral lower extremities Neurologic: Awake alert.  Distracted.  No obvious focal neurological deficits.  Lab Results:  Data Reviewed: I have personally reviewed following labs and imaging studies  CBC: Recent Labs  Lab 02/14/18 2107 02/15/18 0229 02/16/18 0339 02/17/18 0539 02/18/18 0538  WBC 31.9* 33.5* 31.8* 18.8* 15.5*  NEUTROABS 28.3*  --   --   --   --   HGB 14.4 13.6 12.5* 12.5* 13.4  HCT 44.4 42.8 38.3* 39.7 42.7  MCV 98.2 99.8 95.5 99.5 98.2  PLT 325 353 384 402* 459*    Basic Metabolic Panel: Recent Labs  Lab 02/14/18 2107 02/15/18 0229 02/16/18 0339 02/17/18 0539 02/18/18 0538  NA 140 140 140 141 142  K 3.8 3.8 3.4* 3.6 3.7  CL 105 105 106 107 107  CO2 24 17* 23 25 25   GLUCOSE 66* 114* 218* 114* 156*  BUN 74* 66* 56* 40* 30*  CREATININE 4.50* 3.86* 1.89* 1.24 1.04  CALCIUM 8.1* 7.9* 7.8* 7.7* 8.0*  MG  --  1.8  --   --   --     GFR: Estimated Creatinine Clearance: 64.3 mL/min (by C-G formula based on SCr  of 1.04 mg/dL).   Coagulation Profile: Recent Labs  Lab 02/14/18 2107 02/15/18 0229 02/16/18 0339 02/17/18 0539 02/18/18 0538  INR 4.88* 4.93* 4.44* 3.72 2.43    CBG: Recent Labs  Lab 02/17/18 1220 02/17/18 1651 02/17/18 2254 02/18/18 0818 02/18/18 1219  GLUCAP 111* 128* 127* 154* 97     Radiology Studies: Dg Chest Port 1 View  Result Date: 02/18/2018 CLINICAL DATA:  Recent pneumothorax EXAM: PORTABLE CHEST 1 VIEW COMPARISON:  02/17/2018 FINDINGS: The heart remains mildly enlarged. Bilateral diffuse hazy airspace disease and bilateral pleural effusions are stable. Tiny left apical pneumothorax has further improved no evidence of right  pneumothorax. IMPRESSION: Improved tiny left apical pneumothorax. Stable bilateral airspace disease and bilateral small pleural effusions Electronically Signed   By: Marybelle Killings M.D.   On: 02/18/2018 10:38   Dg Chest Port 1 View  Result Date: 02/17/2018 CLINICAL DATA:  Pneumothorax. EXAM: PORTABLE CHEST 1 VIEW COMPARISON:  02/16/2018 FINDINGS: The patient is rotated to the left. The cardiomediastinal silhouette is unchanged. A small left apical pneumothorax is unchanged. Bibasilar lung opacities are unchanged, likely with underlying small pleural effusions. IMPRESSION: 1. Unchanged small left apical pneumothorax. 2. Unchanged bibasilar pneumonia or atelectasis with small pleural effusions. Electronically Signed   By: Logan Bores M.D.   On: 02/17/2018 07:18   Dg Chest Port 1 View  Result Date: 02/16/2018 CLINICAL DATA:  Shortness of breath with history of congestive heart failure. History of lung cancer post right upper lobectomy. EXAM: PORTABLE CHEST 1 VIEW COMPARISON:  02/15/2018 FINDINGS: Lungs are adequately inflated and demonstrate a stable small left apical pneumothorax. Persistent bibasilar opacification likely small effusions with associated atelectasis. Infection in the lung bases is possible. Cardiomediastinal silhouette and remainder of the exam is unchanged. IMPRESSION: Persistent bibasilar opacification which may be due to small effusions with atelectasis, although infection in the lung bases is possible. Stable small left apical pneumothorax. Electronically Signed   By: Marin Olp M.D.   On: 02/16/2018 15:37     Medications:  Scheduled: . diltiazem  300 mg Oral Daily  . insulin aspart  0-9 Units Subcutaneous TID WC  . ipratropium-albuterol  3 mL Nebulization TID  . levothyroxine  112 mcg Oral Q0600  . mouth rinse  15 mL Mouth Rinse BID  . metoprolol tartrate  12.5 mg Oral BID  . mometasone-formoterol  2 puff Inhalation BID   Continuous: . ceFEPime (MAXIPIME) IV 1 g (02/18/18  0927)   [id number] **OR** acetaminophen, albuterol, bisacodyl    Assessment/Plan:  Healthcare associated pneumonia with acute hypoxic respiratory failure/pneumothorax/sepsis, present on admission Patient seen by pulmonology.  Did not require any intervention for the pneumothorax.  X-ray from this morning shows that the pneumothorax has improved. He does have multiple blebs one of which must have ruptured.  Patient possibly aspirating chronically.  He was seen by speech therapy recently and was placed on dysphagia 3 diet.  Patient was initially started on vancomycin and cefepime.  His MRSA PCR was negative.  Blood cultures negative.  Vancomycin was discontinued 11/30.  He remains on cefepime.  He did have sepsis at the time of admission with leukocytosis tachycardia and tachypnea.  Sepsis physiology improved.  Patient noted to be more tachypneic today.  Chest x-ray repeated this morning and shows stable findings.  WBC has improved.  Chronic atrial fibrillation with RVR Patient with known history of atrial fibrillation.  Patient was placed back on his oral metoprolol and Cardizem.  Transitioned to long-acting Cardizem this morning.  Monitor heart rate on telemetry.  Echocardiogram from June 3976 showed systolic CHF with a EF of about 35%-40%.  TSH was 4.5 on 11/19 with a normal free T4.  Chads 2 vascular score is 6.  Patient was on warfarin which was held due to supratherapeutic INR.  INR is therapeutic today.  We will request pharmacy to dose warfarin.  Acute kidney injury on chronic kidney disease stage III with hematuria/acute urinary retention Patient had a indwelling Foley catheter which was placed during his previous hospitalization.  Apparently the catheter was malpositioned resulting in retention.  This was the most likely reason for his renal failure.  Catheter was replaced in the emergency department.  Hematuria has resolved.  Patient continues to have good urine output.  Creatinine has  improved and back to baseline.  Renal ultrasound did not show any hydronephrosis.  Patient was seen by urology.  IV fluids were discontinued.  Supratherapeutic INR Patient on warfarin for history of atrial fibrillation.  Patient noted to have supratherapeutic INR on admission.  INR was allowed to come down on its own.  Now in the therapeutic range.  Abnormal UA  Culture without any growth.  Abnormal UA was most likely due to hematuria.  No evidence for UTI.    QT prolongation on EKG EKG reviewed.  Hard to assess QT interval based on that EKG. repeat EKG was also poor quality.  Potassium is stable  Acute metabolic encephalopathy Multifactorial etiology.  Mentation has improved.  Some more lethargy noted today.  Continue to monitor.  History of COPD Currently not wheezing.  Continue home medications.  Chronic systolic CHF Ejection fraction noted to be 35 to 40% based on echo from June 2018.  Holding his diuretics and lisinopril.  His weight is stable.  He has been in negative fluid balance.  History of coronary artery disease Stable.  Continue current medications.  Essential hypertension Blood pressure soft but stable.  Type 2 diabetes mellitus, controlled HbA1c 6.0 on 02/07/2018.  Continue SSI.  Continue to monitor CBGs.  CBGs are reasonably well controlled.  History of dementia with history of behavioral disturbances Stable currently.  Patient was started on Zyprexa during his previous hospitalization.  Currently on hold due to prolonged QT.  History of hypothyroidism Synthroid  Physical deconditioning PT and OT evaluation.  DVT Prophylaxis: Supratherapeutic INR    Code Status: DNR Family Communication: Discussed with granddaughter who was at the bedside Disposition Plan: Patient appears to have had a setback today.  Could be due to aspiration.  Chest x-ray without any worsening of infiltrates.  Guarded prognosis.    LOS: 4 days   [place]  [facility] Pager [phone] 02/18/2018, 1:10 PM  If 7PM-7AM, please contact night-coverage at [email or web address], password [facility]

## 2018-02-18 NOTE — Progress Notes (Signed)
   NAME:  Justin Garner, MRN:  [id number], DOB:  Jul 31, 1932, LOS: 4 ADMISSION DATE:  02/14/2018, CONSULTATION DATE: 02/15/2018 REFERRING MD: [place] Pink, CHIEF COMPLAINT: Pneumothorax  Brief History    Patient was admitted with shortness of breath History of congestive heart failure, history of lung cancer status post right upper lobectomy Came in from a nursing home with shortness of breath  History of present illness   Patient was admitted with shortness of breath Able to provide much of a history  Past Medical History  Coronary artery disease Lung cancer status post lobectomy, radiation treatment, chemotherapy Chronic kidney disease Chronic obstructive pulmonary disease Diabetes GERD Hypertension Atrial for relation History of PE  Significant Hospital Events     Consults:  PCCM  Procedures:  None  Significant Diagnostic Tests:  CT scan of the chest showing pneumothorax, multiple blebs Chest x-ray with pneumothorax  Micro Data:  Blood culture 02/15/2018 no growth Urine culture 02/06/2018  Antimicrobials:  Azithromycin 11/20-1123 Cefepime 11/28>> Ceftriaxone 11/20- 11/23 Vancomycin 11/29   Interim history/subjective:  Denies any chest pains, denies any shortness of breath at present  Objective   Blood pressure 133/88, pulse 62, temperature 98.3 F (36.8 C), resp. rate (Abnormal) 28, height 6\' 3"  (1.905 m), weight 99.6 kg, SpO2 93 %.        Intake/Output Summary (Last 24 hours) at 02/18/2018 1143 Last data filed at 02/18/2018 0500 Gross per 24 hour  Intake 251.83 ml  Output 1975 ml  Net -1723.17 ml   Filed Weights   02/17/18 1100  Weight: 99.6 kg    Examination: General 84 year old chronically ill white male. Not in acute distress HENT NCAT MMM Pulm diffuse rhonchi no accessory use Card rrr no MRG abd soft not tender + bowel sounds  Ext dependent edema chronic venous changes.   Resolved Hospital Problem list   AKI  Assessment & Plan:    Acute Hypoxic respiratory failure 2/2 HCAP superimposed on top of advanced bullous emphysema and apical left pneumothorax -PCXR personally reviewed demonstrates improved aeration, aeration is improved bilaterally some, size of pneumothorax has reduced -Pneumothorax remains stable, and has reduced in size -Suspect he is chronically aspirating Plan Continue current antibiotics Diuretics as BP will tolerate Aspiration precautions, with dysphagia diet Would repeat chest x-ray prior to discharge  History of lung cancer No signs of recurrence of lung cancer on radiological studies Post lobectomy for lung cancer Plan Follow-up outpatient setting  Atrial fibrillation Plan Continue current rate control therapy   Chronic systolic congestive heart failure CAD Plan  Cont diuretics and antihypertensives  Type 2 diabetes Plan ssi   Best practice:  Diet: Regular DVT prophylaxis: Enoxaparin Mobility: As tolerated Code Status: DNR Family Communication: No family at bedside Disposition: PCCM will sign off  Erick Colace ACNP-BC [place] Pager # [phone] OR # [phone] if no answer

## 2018-02-18 NOTE — Progress Notes (Signed)
CSW spoke with patients daughter, Shauna Hugh [phone], regarding disposition plans. Patient was recently admitted to [facility] on 11/25 before being admitted to the hospital on 11/28. Plans are for patient to return to [facility] once medically stable. Patient will need new insurance authorization before returning to facility- CSW has requested facility to start authorization in preparation for discharge.   Kingsley Spittle, [place]  [phone]

## 2018-02-18 NOTE — NC FL2 (Signed)
[place] LEVEL OF CARE SCREENING TOOL     IDENTIFICATION  Patient Name: Justin Garner Birthdate: 07/16/1932 Sex: male Admission Date (Current Location): 02/14/2018  [facility] and [place] Number:  Herbalist and Address:  [facility],  [place] [address] [place]      Provider Number: [id number]  Attending Physician Name and Address:  Bonnielee Haff, MD  Relative Name and Phone Number:       Current Level of Care: Hospital Recommended Level of Care: [place] Prior Approval Number:    Date Approved/Denied:   PASRR Number: [id number]  Discharge Plan: SNF    Current Diagnoses: Patient Active Problem List   Diagnosis Date Noted  . HCAP (healthcare-associated pneumonia) 02/15/2018  . CKD (chronic kidney disease) stage 3, GFR 30-59 ml/min (HCC) 02/15/2018  . QT prolongation 02/15/2018  . Type 2 diabetes mellitus ([place]) 02/15/2018  . Dementia ([place]) 02/15/2018  . Physical deconditioning 02/15/2018  . AKI (acute kidney injury) ([place]) 02/14/2018  . Acute respiratory failure with hypoxia ([place]) 02/08/2018  . Generalized weakness 02/05/2018  . Hypoglycemia 02/05/2018  . Hemoptysis 04/26/2017  . Lobar pneumonia ([place]) 12/20/2016  . Atrial fibrillation with RVR ([place]) 06/09/2016  . Acute metabolic encephalopathy 03/28/3233  . COPD exacerbation ([place]) 11/15/2015  . Acute on chronic kidney failure-II 11/15/2015  . Malnutrition of moderate degree 11/15/2015  . Leukocytosis 07/12/2015  . COPD (chronic obstructive pulmonary disease) ([place])   . Cardiomyopathy ([place])   . Supratherapeutic INR   . CAD in native artery   . Hypomagnesemia   . UTI (urinary tract infection) 06/13/2015  . CAP (community acquired pneumonia) 06/13/2015  . Acute on chronic respiratory failure with hypoxia ([place]) 06/13/2015  . Hypothyroidism 06/13/2015  . HLD (hyperlipidemia) 06/13/2015  . Chronic systolic CHF (congestive heart failure) ([place])   .  Protein-calorie malnutrition, severe ([place]) 12/29/2013  . Hypokalemia 12/28/2013  . Sepsis ([place]) 12/28/2013  . 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 ([place]) 12/28/2013  . Acute systolic heart failure ([place]) 08/12/2013  . Primary cancer of right upper lobe of lung ([place]) 06/30/2013  . Coronary atherosclerosis of native coronary artery 05/28/2013  . Essential hypertension, benign 05/28/2013  . Chronic atrial fibrillation 12/24/2012  . Pulmonary embolism ([place]) 12/24/2012  . Pneumothorax 02/21/2011  . COPD with acute bronchitis ([place]) 04/23/2008    Orientation RESPIRATION BLADDER Height & Weight     Self  O2(3 L) Continent Weight: 219 lb 9.3 oz (99.6 kg) Height:  6\' 3"  (190.5 cm)  BEHAVIORAL SYMPTOMS/MOOD NEUROLOGICAL BOWEL NUTRITION STATUS      Continent Diet(DYS 3)  AMBULATORY STATUS COMMUNICATION OF NEEDS Skin   Limited Assist Verbally Normal                       Personal Care Assistance Level of Assistance  Bathing, Feeding, Dressing Bathing Assistance: Limited assistance Feeding assistance: Independent Dressing Assistance: Limited assistance     Functional Limitations Info    Sight Info: Adequate Hearing Info: Adequate Speech Info: Adequate    SPECIAL CARE FACTORS FREQUENCY  PT (By licensed PT), OT (By licensed OT)     PT Frequency: 5 OT Frequency: 5            Contractures      Additional Factors Info  Code Status, Allergies Code Status Info: DNR  Allergies Info: NKA            Current Medications (02/18/2018):  This is the  current hospital active medication list Current Facility-Administered Medications  Medication Dose Route Frequency Provider Last Rate Last Dose  . acetaminophen (TYLENOL) tablet 650 mg  650 mg Oral Q6H PRN Bonnielee Haff, MD       Or  . acetaminophen (TYLENOL) suppository 650 mg  650 mg Rectal Q6H PRN Bonnielee Haff, MD      . albuterol (PROVENTIL) (2.5 MG/3ML) 0.083% nebulizer solution 2.5 mg  2.5 mg Nebulization Q4H PRN Bonnielee Haff, MD      . bisacodyl (DULCOLAX) suppository 10 mg  10 mg Rectal Daily PRN Bonnielee Haff, MD      . ceFEPIme (MAXIPIME) 1 g in sodium chloride 0.9 % 100 mL IVPB  1 g Intravenous Q8H Thomes Lolling, RPH 200 mL/hr at 02/18/18 0927 1 g at 02/18/18 0927  . diltiazem (CARDIZEM CD) 24 hr capsule 300 mg  300 mg Oral Daily Bonnielee Haff, MD   300 mg at 02/18/18 9373  . insulin aspart (novoLOG) injection 0-9 Units  0-9 Units Subcutaneous TID WC Bonnielee Haff, MD   2 Units at 02/18/18 [phone]  . ipratropium-albuterol (DUONEB) 0.5-2.5 (3) MG/3ML nebulizer solution 3 mL  3 mL Nebulization TID Bonnielee Haff, MD   3 mL at 02/18/18 1439  . levothyroxine (SYNTHROID, LEVOTHROID) tablet 112 mcg  112 mcg Oral Q0600 Thomes Lolling, RPH   112 mcg at 02/18/18 0542  . MEDLINE mouth rinse  15 mL Mouth Rinse BID Bonnielee Haff, MD   15 mL at 02/18/18 6811  . metoprolol tartrate (LOPRESSOR) tablet 12.5 mg  12.5 mg Oral BID Bonnielee Haff, MD   12.5 mg at 02/18/18 5726  . mometasone-formoterol (DULERA) 100-5 MCG/ACT inhaler 2 puff  2 puff Inhalation BID Bonnielee Haff, MD   2 puff at 02/18/18 [phone]  . warfarin (COUMADIN) tablet 5 mg  5 mg Oral ONCE-1800 Glogovac, Nikola, RPH      . Warfarin - Pharmacist Dosing Inpatient   Does not apply q1800 Royetta Asal, [facility]         Discharge Medications: Please see discharge summary for a list of discharge medications.  Relevant Imaging Results:  Relevant Lab Results:   Additional Information SSN: [id number]  [place], LCSW

## 2018-02-18 NOTE — Care Management Important Message (Signed)
Important Message  Patient Details  Name: Justin Garner MRN: [id number] Date of Birth: 1932-11-06   Medicare Important Message Given:  Yes    Kerin Salen 02/18/2018, 11:10 AMImportant Message  Patient Details  Name: Justin Garner MRN: [id number] Date of Birth: 05/24/1932   Medicare Important Message Given:  Yes    Kerin Salen 02/18/2018, 11:10 AM

## 2018-02-18 NOTE — Progress Notes (Signed)
ANTICOAGULATION CONSULT NOTE - Initial Consult  Pharmacy Consult for warfarin Indication: atrial fibrillation  No Known Allergies  Patient Measurements: Height: 6\' 3"  (190.5 cm) Weight: 219 lb 9.3 oz (99.6 kg) IBW/kg (Calculated) : 84.5 Heparin Dosing Weight:   Vital Signs: Temp: 97.6 F (36.4 C) (12/02 1412) Temp Source: Oral (12/02 1412) BP: 119/79 (12/02 1412) Pulse Rate: 78 (12/02 1412)  Labs: Recent Labs    02/16/18 0339 02/17/18 0539 02/18/18 0538  HGB 12.5* 12.5* 13.4  HCT 38.3* 39.7 42.7  PLT 384 402* 459*  LABPROT 41.6* 36.2* 26.1*  INR 4.44* 3.72 2.43  CREATININE 1.89* 1.24 1.04    Estimated Creatinine Clearance: 64.3 mL/min (by C-G formula based on SCr of 1.04 mg/dL).   Medical History: Past Medical History:  Diagnosis Date  . ASCVD (arteriosclerotic cardiovascular disease)   . Cancer [facility]) 2009   Upper lobectomy;radiation; chemotherapy  . Chronic systolic CHF (congestive heart failure) ([place])   . CKD (chronic kidney disease), stage II   . COPD (chronic obstructive pulmonary disease) ([place])   . Coronary artery disease   . Diabetes mellitus   . GERD (gastroesophageal reflux disease)   . Hyperlipidemia   . Hypertension   . Leukocytosis 07/12/2015  . PAF (paroxysmal atrial fibrillation) (HCC)    Chronic warfarin therapy  . 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 Pulmonary embolism (HCC)    PMH of  . Thyroid disease     Medications:  Scheduled:  . diltiazem  300 mg Oral Daily  . insulin aspart  0-9 Units Subcutaneous TID WC  . ipratropium-albuterol  3 mL Nebulization TID  . levothyroxine  112 mcg Oral Q0600  . mouth rinse  15 mL Mouth Rinse BID  . metoprolol tartrate  12.5 mg Oral BID  . mometasone-formoterol  2 puff Inhalation BID    Assessment: Pharmacy is consulted  to dose warfarin in 84 yo male with PMH of a.fib. Pt was admitted with elevated INR, but INR was allowed to trend down to therapeutic range.   Today, 02/18/18   INR 2.43  PT 26.1  No bleeding issues noted  Hgb 13.4, Plt 459    Goal of Therapy:  INR 2-3 Monitor platelets by anticoagulation protocol: Yes   Plan:   Warfarin 5 mg PO x 1  Daily INR   Monitor for signs and symptoms of bleeding  Royetta Asal, PharmD, BCPS Pager [phone] 02/18/2018 2:28 PM

## 2018-02-18 NOTE — Plan of Care (Signed)
  Problem: Clinical Measurements: Goal: Ability to maintain clinical measurements within normal limits will improve Outcome: Progressing Goal: Will remain free from infection Outcome: Progressing Goal: Diagnostic test results will improve Outcome: Progressing Goal: Respiratory complications will improve Outcome: Progressing Goal: Cardiovascular complication will be avoided Outcome: Progressing   Problem: Activity: Goal: Risk for activity intolerance will decrease Outcome: Progressing   Problem: Nutrition: Goal: Adequate nutrition will be maintained Outcome: Progressing   Problem: Coping: Goal: Level of anxiety will decrease Outcome: Progressing   Problem: Elimination: Goal: Will not experience complications related to bowel motility Outcome: Progressing Goal: Will not experience complications related to urinary retention Outcome: Progressing   Problem: Pain Managment: Goal: General experience of comfort will improve Outcome: Progressing   Problem: Skin Integrity: Goal: Risk for impaired skin integrity will decrease Outcome: Progressing

## 2018-02-19 LAB — GLUCOSE, CAPILLARY
Glucose-Capillary: 124 mg/dL — ABNORMAL HIGH (ref 70–99)
Glucose-Capillary: 120 mg/dL — ABNORMAL HIGH (ref 70–99)
Glucose-Capillary: 154 mg/dL — ABNORMAL HIGH (ref 70–99)
Glucose-Capillary: 105 mg/dL — ABNORMAL HIGH (ref 70–99)

## 2018-02-19 LAB — PROTIME-INR
INR: 1.64
Prothrombin Time: 19.3 seconds — ABNORMAL HIGH (ref 11.4–15.2)

## 2018-02-19 MED ORDER — WARFARIN SODIUM 5 MG PO TABS
5.0000 mg | ORAL_TABLET | Freq: Once | ORAL | Status: AC
  Administered 2018-02-19: 5 mg via ORAL
  Filled 2018-02-19: qty 1

## 2018-02-19 NOTE — Progress Notes (Signed)
PCP notified that patient confused trying to get up out of bed.  BP 145/100.  States she will put something in for him.

## 2018-02-19 NOTE — Progress Notes (Signed)
Clinical Social Worker following patient and family for support and discharge needs. Patient at this time has a bed at [facility] and patient has also been approved for rehab stay through [facility]. CSW made MD aware that patient has a bed and authorization through insurance. Per MD patient is not medically ready for discharge. CSW will continue to follow for medical readiness.   Rhea Pink, MSW,  [place]

## 2018-02-19 NOTE — Progress Notes (Signed)
[facility] PROGRESS NOTE  Justin Garner IRS:[id number] DOB: 09-25-1932 DOA: 02/14/2018  PCP: [name], MD  Brief History/IntervalSummary:  84 y.o. male with medical history significant of CAD, CKD stage III, COPD, chronic systolic CHF, type 2 diabetes, GERD, hypertension, hyperlipidemia, paroxysmal A. fib on Coumadin, history of lung cancer status post right upper lobe lobectomy in 2009 prior PE presenting to the hospital for evaluation of shortness of breath and blood in Foley catheter. Patient was recently admitted from February 04, 2018 to February 11, 2018 for shortness of breath, confusion, and rhinovirus infection.  He was discharged home on 2 L oxygen via nasal cannula.    He was found to require higher doses of oxygen.  Was noted to have urinary retention requiring a change in Foley catheter.  Chest x-ray showed small pneumothorax.  CT scan showed the same and also showed evidence for pneumonia.  Patient was hospitalized and started on broad-spectrum antibiotics.     Reason for Visit: Healthcare associated pneumonia.  Atrial fibrillation with RVR  Consultants: Urology.  Pulmonology.  Palliative medicine  Procedures: Replacement of Foley catheter  Antibiotics: Vancomycin and cefepime Vancomycin was discontinued on 11/30  Subjective/Interval History: Patient was agitated overnight.  Was given sedative agents.  Is noted to be lethargic this morning.      Objective:  Vital Signs  Vitals:   02/19/18 0641 02/19/18 0718 02/19/18 1012 02/19/18 1344  BP: 135/88  130/84 136/82  Pulse: 61  60 68  Resp: (!) 24   (!) 23  Temp: 97.8 F (36.6 C)   97.7 F (36.5 C)  TempSrc: Oral   Oral  SpO2: 100% 93%  95%  Weight:      Height:        Intake/Output Summary (Last 24 hours) at 02/19/2018 1346 Last data filed at 02/19/2018 1344 Gross per 24 hour  Intake 411.5 ml  Output 1200 ml  Net -788.5 ml   Filed Weights   02/17/18 1100  Weight: 99.6 kg    General  appearance: Noted to be lethargic this morning.  Does respond but goes right back to sleep. Resp: Coarse breath sounds bilaterally.  Upper airway sounds are heard.  No wheezing or rhonchi.  Crackles at the bases. Cardio: S1-S2 is irregularly irregular.  Telemetry shows atrial fibrillation GI: Abdomen remains soft.  Nontender nondistended Extremities: Minimal edema bilateral lower extremities Neurologic: Lethargic today.  Arousable.  No obvious focal neurological deficits.  Lab Results:  Data Reviewed: I have personally reviewed following labs and imaging studies  CBC: Recent Labs  Lab 02/14/18 2107 02/15/18 0229 02/16/18 0339 02/17/18 0539 02/18/18 0538  WBC 31.9* 33.5* 31.8* 18.8* 15.5*  NEUTROABS 28.3*  --   --   --   --   HGB 14.4 13.6 12.5* 12.5* 13.4  HCT 44.4 42.8 38.3* 39.7 42.7  MCV 98.2 99.8 95.5 99.5 98.2  PLT 325 353 384 402* 459*    Basic Metabolic Panel: Recent Labs  Lab 02/14/18 2107 02/15/18 0229 02/16/18 0339 02/17/18 0539 02/18/18 0538  NA 140 140 140 141 142  K 3.8 3.8 3.4* 3.6 3.7  CL 105 105 106 107 107  CO2 24 17* 23 25 25   GLUCOSE 66* 114* 218* 114* 156*  BUN 74* 66* 56* 40* 30*  CREATININE 4.50* 3.86* 1.89* 1.24 1.04  CALCIUM 8.1* 7.9* 7.8* 7.7* 8.0*  MG  --  1.8  --   --   --     GFR: Estimated Creatinine Clearance: 64.3  mL/min (by C-G formula based on SCr of 1.04 mg/dL).   Coagulation Profile: Recent Labs  Lab 02/15/18 0229 02/16/18 0339 02/17/18 0539 02/18/18 0538 02/19/18 0535  INR 4.93* 4.44* 3.72 2.43 1.64    CBG: Recent Labs  Lab 02/18/18 1219 02/18/18 1711 02/18/18 2218 02/19/18 0735 02/19/18 1134  GLUCAP 97 103* 134* 124* 120*     Radiology Studies: Dg Chest Port 1 View  Result Date: 02/18/2018 CLINICAL DATA:  Recent pneumothorax EXAM: PORTABLE CHEST 1 VIEW COMPARISON:  02/17/2018 FINDINGS: The heart remains mildly enlarged. Bilateral diffuse hazy airspace disease and bilateral pleural effusions are stable.  Tiny left apical pneumothorax has further improved no evidence of right pneumothorax. IMPRESSION: Improved tiny left apical pneumothorax. Stable bilateral airspace disease and bilateral small pleural effusions Electronically Signed   By: Marybelle Killings M.D.   On: 02/18/2018 10:38     Medications:  Scheduled: . diltiazem  300 mg Oral Daily  . insulin aspart  0-9 Units Subcutaneous TID WC  . ipratropium-albuterol  3 mL Nebulization TID  . levothyroxine  112 mcg Oral Q0600  . mouth rinse  15 mL Mouth Rinse BID  . metoprolol tartrate  12.5 mg Oral BID  . mometasone-formoterol  2 puff Inhalation BID  . warfarin  5 mg Oral ONCE-1800  . Warfarin - Pharmacist Dosing Inpatient   Does not apply q1800   Continuous: . ceFEPime (MAXIPIME) IV 1 g (02/19/18 1009)   TGG:YIRSWNIOEVOJJ **OR** acetaminophen, albuterol, bisacodyl    Assessment/Plan:  Healthcare associated pneumonia with acute hypoxic respiratory failure/pneumothorax/sepsis, present on admission Patient seen by pulmonology.  Did not require any intervention for the pneumothorax.  Serial x-rays have shown improvement in pneumothorax.  He does have multiple blebs one of which must have ruptured.  He was seen by speech therapy recently and was placed on dysphagia 3 diet.  Patient was initially started on vancomycin and cefepime.  His MRSA PCR was negative.  Blood cultures negative.  Vancomycin was discontinued 11/30.  He remains on cefepime.  He did have sepsis at the time of admission with leukocytosis tachycardia and tachypnea.  Sepsis physiology improved.  Patient was more tachypneic yesterday and remains the same today.  Chest x-ray done yesterday showed stable findings.  Patient likely aspirating chronically.  Prognosis is guarded.  Declining slowly.  Will discuss with family.  Will consider palliative medicine.   Chronic atrial fibrillation with RVR Patient with known history of atrial fibrillation.  Patient was placed back on his oral  metoprolol and Cardizem. Monitor heart rate on telemetry.  Echocardiogram from June 0093 showed systolic CHF with a EF of about 35%-40%.  TSH was 4.5 on 11/19 with a normal free T4.  Chads 2 vascular score is 6.  Patient was on warfarin which was held due to supratherapeutic INR.  Warfarin reinitiated per pharmacy.    Acute kidney injury on chronic kidney disease stage III with hematuria/acute urinary retention Patient had a indwelling Foley catheter which was placed during his previous hospitalization.  Apparently the catheter was malpositioned resulting in retention.  This was the most likely reason for his renal failure.  Catheter was replaced in the emergency department.  Hematuria has resolved.  Patient continues to have good urine output.  Creatinine has improved and back to baseline.  Renal ultrasound did not show any hydronephrosis.  Patient was seen by urology.  IV fluids were discontinued.  Supratherapeutic INR Patient on warfarin for history of atrial fibrillation.  Patient noted to have supratherapeutic INR  on admission.  INR was allowed to come down on its own.  To be restarted on warfarin.  Abnormal UA  Culture without any growth.  Abnormal UA was most likely due to hematuria.  No evidence for UTI.    QT prolongation on EKG EKG reviewed.  Hard to assess QT interval based on that EKG. repeat EKG was also poor quality.  Potassium is stable  Acute metabolic encephalopathy Multifactorial etiology.  Mentation had improved but noted to be more lethargic the last 24 hours.  Recheck labs tomorrow.  History of COPD Currently not wheezing.  Continue home medications.  Chronic systolic CHF Ejection fraction noted to be 35 to 40% based on echo from June 2018.  Holding his diuretics and lisinopril.  His weight is stable.  He has been in negative fluid balance.  History of coronary artery disease Stable.  Continue current medications.  Essential hypertension Blood pressure soft but  stable.  Type 2 diabetes mellitus, controlled HbA1c 6.0 on 02/07/2018.  Continue SSI.  Continue to monitor CBGs.  CBGs are reasonably well controlled.  History of dementia with history of behavioral disturbances Stable currently.  Patient was started on Zyprexa during his previous hospitalization.  Currently on hold due to prolonged QT.  History of hypothyroidism Synthroid  Physical deconditioning PT and OT evaluation. Will need to go to skilled nursing facility.  DVT Prophylaxis: Supratherapeutic INR    Code Status: DNR Family Communication: No family at bedside today Disposition Plan: Has not shown any improvement in the last 24 hours.  He is likely aspirating. No new changes noted on chest x-ray yesterday.  Guarded prognosis.  Consult palliative medicine.      LOS: 5 days   [facility] Pager [phone] 02/19/2018, 1:46 PM  If 7PM-7AM, please contact night-coverage at [email or web address], password [facility]

## 2018-02-19 NOTE — Plan of Care (Signed)
  Problem: Clinical Measurements: Goal: Respiratory complications will improve Outcome: Progressing   

## 2018-02-19 NOTE — Progress Notes (Signed)
ANTICOAGULATION CONSULT NOTE - Follow-Up Consult  Pharmacy Consult for warfarin Indication: atrial fibrillation  No Known Allergies  Patient Measurements: Height: 6\' 3"  (190.5 cm) Weight: 219 lb 9.3 oz (99.6 kg) IBW/kg (Calculated) : 84.5 Heparin Dosing Weight:   Vital Signs: Temp: 97.8 F (36.6 C) (12/03 0641) Temp Source: Oral (12/03 0641) BP: 130/84 (12/03 1012) Pulse Rate: 60 (12/03 1012)  Labs: Recent Labs    02/17/18 0539 02/18/18 0538 02/19/18 0535  HGB 12.5* 13.4  --   HCT 39.7 42.7  --   PLT 402* 459*  --   LABPROT 36.2* 26.1* 19.3*  INR 3.72 2.43 1.64  CREATININE 1.24 1.04  --     Estimated Creatinine Clearance: 64.3 mL/min (by C-G formula based on SCr of 1.04 mg/dL).   Medical History: Past Medical History:  Diagnosis Date  . ASCVD (arteriosclerotic cardiovascular disease)   . Cancer [facility]) 2009   Upper lobectomy;radiation; chemotherapy  . Chronic systolic CHF (congestive heart failure) ([place])   . CKD (chronic kidney disease), stage II   . COPD (chronic obstructive pulmonary disease) ([place])   . Coronary artery disease   . Diabetes mellitus   . GERD (gastroesophageal reflux disease)   . Hyperlipidemia   . Hypertension   . Leukocytosis 07/12/2015  . PAF (paroxysmal atrial fibrillation) ([facility])    Chronic warfarin therapy  . 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 Pulmonary embolism ([facility])    PMH of  . Thyroid disease     Medications:  Scheduled:  . diltiazem  300 mg Oral Daily  . insulin aspart  0-9 Units Subcutaneous TID WC  . ipratropium-albuterol  3 mL Nebulization TID  . levothyroxine  112 mcg Oral Q0600  . mouth rinse  15 mL Mouth Rinse BID  . metoprolol tartrate  12.5 mg Oral BID  . mometasone-formoterol  2 puff Inhalation BID  . Warfarin - Pharmacist Dosing  Inpatient   Does not apply q1800    Assessment: Pharmacy is consulted to dose warfarin in 84 yo male with PMH of a.fib. Pt was admitted with elevated INR, but INR was allowed to trend down to therapeutic range.   Today, 02/19/18   INR  1.64, subtherapeutic after not receiving doses for 3 days prior to yesterday   PT 19.3  No bleeding issues noted  Hgb 13.4, Plt 459    Goal of Therapy:  INR 2-3 Monitor platelets by anticoagulation protocol: Yes   Plan:   Warfarin 5 mg PO x 1  Daily INR   Monitor for signs and symptoms of bleeding   Royetta Asal, PharmD, BCPS Pager [phone] 02/19/2018 11:11 AM

## 2018-02-19 NOTE — Progress Notes (Signed)
Physical Therapy Treatment Patient Details Name: Justin Garner MRN: [id number] DOB: [birth year] Today's Date: 02/19/2018    History of Present Illness Patient is an 84 y/o male admitted from SNF due to wekaness, blood in urine and SOB due to HCAP.  PMH includes a-fib, PE, HTN, CHF, COPD, DM.    PT Comments    Patient not tolerating mobility or exercise well this session.  Remains rather tachycardic and has worsened activity tolerance than last session.  Also more difficult to engage in mobility as pt cursing and participating reluctantly.  Did not feel safe to place pt in chair even though lungs with audible crackles.  Did sit EOB for some short breathing exercises.  Felt due to lethargy and elevated HR not stable for OOB this session.  Will continue skilled PT and continue to recommend SNF level rehab upon d/c.    Follow Up Recommendations  Supervision/Assistance - 24 hour;SNF     Equipment Recommendations       Recommendations for Other Services       Precautions / Restrictions Precautions Precautions: Fall    Mobility  Bed Mobility Overal bed mobility: Needs Assistance Bed Mobility: Rolling;Sidelying to Sit;Sit to Supine Rolling: Mod assist Sidelying to sit: Mod assist   Sit to supine: Mod assist   General bed mobility comments: patient slow to respond and lethargic with pt initially refusing and needing encouragement & time to participate  Transfers Overall transfer level: Needs assistance Equipment used: Rolling walker (2 wheeled) Transfers: Sit to/from Stand Sit to Stand: From elevated surface;Mod assist         General transfer comment: heavy lifting assist from EOB   Ambulation/Gait Ambulation/Gait assistance: Mod assist Gait Distance (Feet): 2 Feet Assistive device: Rolling walker (2 wheeled) Gait Pattern/deviations: Step-to pattern;Trunk flexed     General Gait Details: side steps to HOB, HR 135 with mobility   Stairs              Wheelchair Mobility    Modified Rankin (Stroke Patients Only)       Balance                                            Cognition Arousal/Alertness: Lethargic Behavior During Therapy: Agitated Overall Cognitive Status: Impaired/Different from baseline Area of Impairment: Memory;Following commands;Safety/judgement;Problem solving                 Orientation Level: Disoriented to;Place;Time;Situation Current Attention Level: Sustained Memory: Decreased short-term memory Following Commands: Follows one step commands with increased time;Follows one step commands inconsistently Safety/Judgement: Decreased awareness of deficits;Decreased awareness of safety   Problem Solving: Decreased initiation;Requires tactile cues;Requires verbal cues        Exercises      General Comments General comments (skin integrity, edema, etc.): sate EOB briefly with encouragement for trunk extension and deep breaths (RN reports pt threw his incentive spirometer out the door last pm)      Pertinent Vitals/Pain Pain Assessment: Faces Faces Pain Scale: Hurts a little bit Pain Location: generalized Pain Descriptors / Indicators: Grimacing Pain Intervention(s): Monitored during session;Repositioned;Limited activity within patient's tolerance    Home Living                      Prior Function            PT Goals (current goals can now be  found in the care plan section) Progress towards PT goals: Not progressing toward goals - comment    Frequency    Min 3X/week      PT Plan Current plan remains appropriate    Co-evaluation              AM-PAC PT "6 Clicks" Mobility   Outcome Measure  Help needed turning from your back to your side while in a flat bed without using bedrails?: A Lot Help needed moving from lying on your back to sitting on the side of a flat bed without using bedrails?: A Lot Help needed moving to and from a bed to a chair  (including a wheelchair)?: Total Help needed standing up from a chair using your arms (e.g., wheelchair or bedside chair)?: A Lot Help needed to walk in hospital room?: Total Help needed climbing 3-5 steps with a railing? : Total 6 Click Score: 9    End of Session Equipment Utilized During Treatment: Gait belt;Oxygen Activity Tolerance: Patient limited by fatigue Patient left: in bed;with call bell/phone within reach;with bed alarm set;with nursing/sitter in room   PT Visit Diagnosis: Other abnormalities of gait and mobility (R26.89);Muscle weakness (generalized) (M62.81)     Time: 2122-4825 PT Time Calculation (min) (ACUTE ONLY): 22 min  Charges:  $Therapeutic Activity: 8-22 mins                     Magda Kiel, [place] [facility] [phone] 02/19/2018    Reginia Naas 02/19/2018, 3:39 PM

## 2018-02-20 DIAGNOSIS — J189 Pneumonia, unspecified organism: Secondary | ICD-10-CM

## 2018-02-20 DIAGNOSIS — Z7189 Other specified counseling: Secondary | ICD-10-CM

## 2018-02-20 DIAGNOSIS — Z515 Encounter for palliative care: Secondary | ICD-10-CM

## 2018-02-20 LAB — CULTURE, BLOOD (ROUTINE X 2)
CULTURE: NO GROWTH
Culture: NO GROWTH
SPECIAL REQUESTS: ADEQUATE
Special Requests: ADEQUATE

## 2018-02-20 LAB — BASIC METABOLIC PANEL
ANION GAP: 10 (ref 5–15)
BUN: 23 mg/dL (ref 8–23)
CHLORIDE: 107 mmol/L (ref 98–111)
CO2: 25 mmol/L (ref 22–32)
Calcium: 8 mg/dL — ABNORMAL LOW (ref 8.9–10.3)
Creatinine, Ser: 0.97 mg/dL (ref 0.61–1.24)
GFR calc Af Amer: >60 mL/min (ref >60)
GFR calc non Af Amer: >60 mL/min (ref >60)
Glucose, Bld: 140 mg/dL — ABNORMAL HIGH (ref 70–99)
Potassium: 3.7 mmol/L (ref 3.5–5.1)
Sodium: 142 mmol/L (ref 135–145)

## 2018-02-20 LAB — PROTIME-INR
INR: 1.75
Prothrombin Time: 20.2 seconds — ABNORMAL HIGH (ref 11.4–15.2)

## 2018-02-20 LAB — GLUCOSE, CAPILLARY
GLUCOSE-CAPILLARY: 99 mg/dL (ref 70–99)
Glucose-Capillary: 124 mg/dL — ABNORMAL HIGH (ref 70–99)
Glucose-Capillary: 209 mg/dL — ABNORMAL HIGH (ref 70–99)
Glucose-Capillary: 126 mg/dL — ABNORMAL HIGH (ref 70–99)

## 2018-02-20 LAB — CBC
HCT: 43.6 % (ref 39.0–52.0)
Hemoglobin: 13.7 g/dL (ref 13.0–17.0)
MCH: 30.8 pg (ref 26.0–34.0)
MCHC: 31.4 g/dL (ref 30.0–36.0)
MCV: 98 fL (ref 80.0–100.0)
Platelets: 516 10*3/uL — ABNORMAL HIGH (ref 150–400)
RBC: 4.45 MIL/uL (ref 4.22–5.81)
RDW: 14.5 % (ref 11.5–15.5)
WBC: 20.4 10*3/uL — ABNORMAL HIGH (ref 4.0–10.5)
nRBC: 0 % (ref 0.0–0.2)

## 2018-02-20 MED ORDER — WARFARIN SODIUM 5 MG PO TABS
5.0000 mg | ORAL_TABLET | Freq: Once | ORAL | Status: AC
  Administered 2018-02-20: 5 mg via ORAL
  Filled 2018-02-20: qty 1

## 2018-02-20 NOTE — Plan of Care (Signed)
Plan of care reviewed. 

## 2018-02-20 NOTE — Progress Notes (Signed)
PROGRESS NOTE    Justin Garner  ZOX:[id number] DOB: 1932/03/29 DOA: 02/14/2018 PCP: [name], MD   Brief Narrative:  84 y.o.malewith medical history significant ofCAD, CKD stage III, COPD, chronic systolic CHF, type 2 diabetes, GERD, hypertension, hyperlipidemia, paroxysmal Justin Garner. fib on Coumadin, history of lung cancer status post right upper lobe lobectomy in 2009 prior PE presenting to the hospital for evaluation of shortness of breath and blood in Foley catheter. Patient was recently admitted from February 04, 2018 to February 11, 2018 for shortness of breath, confusion, and rhinovirus infection. He was discharged home on 2 L oxygen via nasal cannula.   He was found to require higher doses of oxygen.  Was noted to have urinary retention requiring Jacynda Brunke change in Foley catheter.  Chest x-ray showed small pneumothorax.  CT scan showed the same and also showed evidence for pneumonia.  Patient was hospitalized and started on broad-spectrum antibiotics.     Assessment & Plan:   Principal Problem:   Acute respiratory failure with hypoxia (HCC) Active Problems:   Pneumothorax   Essential hypertension, benign   UTI (urinary tract infection)   Chronic systolic CHF (congestive heart failure) (HCC)   Hypothyroidism   HLD (hyperlipidemia)   CAD in native artery   Supratherapeutic INR   COPD (chronic obstructive pulmonary disease) (HCC)   Atrial fibrillation with RVR (HCC)   Acute metabolic encephalopathy   AKI (acute kidney injury) ([place])   HCAP (healthcare-associated pneumonia)   CKD (chronic kidney disease) stage 3, GFR 30-59 ml/min (HCC)   QT prolongation   Type 2 diabetes mellitus ([place])   Dementia ([place])   Physical deconditioning   Healthcare associated pneumonia with acute hypoxic respiratory failure/pneumothorax/sepsis, present on admission Patient seen by pulmonology for pneumonthorax Did not require any intervention for the pneumothorax (last note from 12/2 who note if pt  requires chest tube, he'll require pleurodesis).  Suspicion is that one of blebs ruptured. CXR from 12/2 with improved "tiny" L apical pneumonthorax Seen by speech on 11/30, no over s/s of aspiration or penetration, recommended dysphagia 3 diet with thin liquids Negative MRSA PCR Negative blood cx x 5 days Vancomycin d/c'd on 11/30 Cefepime 11/28 - present.  Leukocytosis worse today.  Follow Procalcitonin and repeat CXR for 12/5. Per previous physician, concern that pt was not improving significantly and possibly aspirating chronically and palliative care consult placed.   Chronic atrial fibrillation with RVR Patient with known history of atrial fibrillation.   Continue metoprolol and diltiazem  Continue tele Echo 09/2016 with EF 35-40% TSH 4.5 on 11/19 with normal free T4 Chadsvasc 6  Continue warfarin per pharmacy   Acute kidney injury on chronic kidney disease stage III with hematuria/acute urinary retention Patient had Kaydince Towles indwelling Foley catheter which was placed during his previous hospitalization.   Apparently the catheter was malpositioned resulting in retention, which was thought to be reason for his AKI Catheter was replaced in the emergency department.  Seen by Dr. Diona Fanti, without any recommendation for intervention Kidney function improved Renal US without any hydro (but some mild-mod bladder distention (11/29) Needs outpatient urology follow up  Supratherapeutic INR Now low, continue warfarin per pharmacy  Abnormal UA  Culture without any growth.  Abnormal UA was most likely due to hematuria.  No evidence for UTI.   Follow up with urology as outpatient  QT prolongation on EKG Caution with QT prolonging meds Follow repeat EKG as outpatient Follow mag/k  Acute metabolic encephalopathy Justin Garner, likely some delirium in setting  of acute hospitalization Delirium precautuins  History of COPD Currently not wheezing.  Continue home medications.  Chronic  systolic CHF Ejection fraction noted to be 35 to 40% based on echo from June 2018.  Holding his diuretics and lisinopril.  His weight is stable.  He has been in negative fluid balance.  History of coronary artery disease Stable.  Continue current medications.  Essential hypertension Blood pressure soft but stable.  Type 2 diabetes mellitus, controlled HbA1c 6.0 on 02/07/2018.  Continue SSI.  Continue to monitor CBGs.  CBGs are reasonably well controlled.  History of dementia with history of behavioral disturbances Stable currently.  Patient was started on Zyprexa during his previous hospitalization.  Currently on hold due to prolonged QT.  History of hypothyroidism Synthroid  Physical deconditioning PT and OT evaluation. Will need to go to skilled nursing facility.  DVT prophylaxis:warfarin Code Status: DNR Family Communication: son Disposition Plan: pending further improvement   Consultants:   Pulm  Palliative care  Urology  Procedures:   Replacement of foley catheter  Antimicrobials:  Anti-infectives (From admission, onward)   Start     Dose/Rate Route Frequency Ordered Stop   02/17/18 1800  ceFEPIme (MAXIPIME) 1 g in sodium chloride 0.9 % 100 mL IVPB     1 g 200 mL/hr over 30 Minutes Intravenous Every 8 hours 02/17/18 1052     02/16/18 0800  ceFEPIme (MAXIPIME) 1 g in sodium chloride 0.9 % 100 mL IVPB  Status:  Discontinued     1 g 200 mL/hr over 30 Minutes Intravenous Every 12 hours 02/16/18 0513 02/17/18 1052   02/16/18 0600  vancomycin (VANCOCIN) IVPB 1000 mg/200 mL premix  Status:  Discontinued     1,000 mg 200 mL/hr over 60 Minutes Intravenous Every 24 hours 02/16/18 0513 02/16/18 1031   02/15/18 2000  ceFEPIme (MAXIPIME) 1 g in sodium chloride 0.9 % 100 mL IVPB  Status:  Discontinued     1 g 200 mL/hr over 30 Minutes Intravenous Every 24 hours 02/15/18 0023 02/16/18 0513   02/15/18 0023  vancomycin variable dose per unstable renal function  (pharmacist dosing)  Status:  Discontinued      Does not apply See admin instructions 02/15/18 0023 02/16/18 0514   02/14/18 2145  ceFEPIme (MAXIPIME) 1 g in sodium chloride 0.9 % 100 mL IVPB     1 g 200 mL/hr over 30 Minutes Intravenous  Once 02/14/18 2130 02/14/18 2241   02/14/18 2145  vancomycin (VANCOCIN) 2,000 mg in sodium chloride 0.9 % 500 mL IVPB     2,000 mg 250 mL/hr over 120 Minutes Intravenous STAT 02/14/18 2131 02/15/18 0115         Subjective: Justin Garner&Ox2 No c/o pain Some confusion overnight  Objective: Vitals:   02/20/18 0437 02/20/18 0737 02/20/18 1227 02/20/18 1410  BP: (!) 131/104  110/76   Pulse: (!) 108  61   Resp: 20     Temp: 97.7 F (36.5 C)  97.6 F (36.4 C)   TempSrc: Oral  Oral   SpO2: 97% 93% 99% (!) 88%  Weight:      Height:        Intake/Output Summary (Last 24 hours) at 02/20/2018 1824 Last data filed at 02/20/2018 1720 Gross per 24 hour  Intake 220 ml  Output 925 ml  Net -705 ml   Filed Weights   02/17/18 1100  Weight: 99.6 kg    Examination:  General exam: Appears calm and comfortable  Respiratory system: Clear to auscultation.  Respiratory effort normal. Cardiovascular system: S1 & S2 heard, RRR.  Gastrointestinal system: Abdomen is nondistended, soft and nontender.. Central nervous system: Alert and oriented x2. No focal neurological deficits. Extremities: moving all extremities Psychiatry: Judgement and insight appear normal. Mood & affect appropriate.     Data Reviewed: I have personally reviewed following labs and imaging studies  CBC: Recent Labs  Lab 02/14/18 2107 02/15/18 0229 02/16/18 0339 02/17/18 0539 02/18/18 0538 02/20/18 0527  WBC 31.9* 33.5* 31.8* 18.8* 15.5* 20.4*  NEUTROABS 28.3*  --   --   --   --   --   HGB 14.4 13.6 12.5* 12.5* 13.4 13.7  HCT 44.4 42.8 38.3* 39.7 42.7 43.6  MCV 98.2 99.8 95.5 99.5 98.2 98.0  PLT 325 353 384 402* 459* 825*   Basic Metabolic Panel: Recent Labs  Lab 02/15/18 0229  02/16/18 0339 02/17/18 0539 02/18/18 0538 02/20/18 0527  NA 140 140 141 142 142  K 3.8 3.4* 3.6 3.7 3.7  CL 105 106 107 107 107  CO2 17* 23 25 25 25   GLUCOSE 114* 218* 114* 156* 140*  BUN 66* 56* 40* 30* 23  CREATININE 3.86* 1.89* 1.24 1.04 0.97  CALCIUM 7.9* 7.8* 7.7* 8.0* 8.0*  MG 1.8  --   --   --   --    GFR: Estimated Creatinine Clearance: 69 mL/min (by C-G formula based on SCr of 0.97 mg/dL). Liver Function Tests: No results for input(s): AST, ALT, ALKPHOS, BILITOT, PROT, ALBUMIN in the last 168 hours. No results for input(s): LIPASE, AMYLASE in the last 168 hours. No results for input(s): AMMONIA in the last 168 hours. Coagulation Profile: Recent Labs  Lab 02/16/18 0339 02/17/18 0539 02/18/18 0538 02/19/18 0535 02/20/18 0527  INR 4.44* 3.72 2.43 1.64 1.75   Cardiac Enzymes: No results for input(s): CKTOTAL, CKMB, CKMBINDEX, TROPONINI in the last 168 hours. BNP (last 3 results) No results for input(s): PROBNP in the last 8760 hours. HbA1C: No results for input(s): HGBA1C in the last 72 hours. CBG: Recent Labs  Lab 02/19/18 1642 02/19/18 2217 02/20/18 0731 02/20/18 1135 02/20/18 1721  GLUCAP 154* 105* 124* 209* 99   Lipid Profile: No results for input(s): CHOL, HDL, LDLCALC, TRIG, CHOLHDL, LDLDIRECT in the last 72 hours. Thyroid Function Tests: No results for input(s): TSH, T4TOTAL, FREET4, T3FREE, THYROIDAB in the last 72 hours. Anemia Panel: No results for input(s): VITAMINB12, FOLATE, FERRITIN, TIBC, IRON, RETICCTPCT in the last 72 hours. Sepsis Labs: Recent Labs  Lab 02/14/18 2146 02/14/18 2354  LATICACIDVEN 1.52 1.23    Recent Results (from the past 240 hour(s))  Urine culture     Status: None   Collection Time: 02/14/18 10:11 PM  Result Value Ref Range Status   Specimen Description   Final    URINE, CATHETERIZED Performed at [facility] [address], [place], [place] [address]    Special Requests   Final     NONE Performed at [facility], [place] [address], [place], [place] [address]    Culture   Final    NO GROWTH Performed at [facility], [place] [address], [place], [place] [address]    Report Status 02/16/2018 FINAL  Final  MRSA PCR Screening     Status: None   Collection Time: 02/15/18  2:25 AM  Result Value Ref Range Status   MRSA by PCR NEGATIVE NEGATIVE Final    Comment:        The GeneXpert MRSA Assay (  FDA approved for NASAL specimens only), is one component of Jourdan Durbin comprehensive MRSA colonization surveillance program. It is not intended to diagnose MRSA infection nor to guide or monitor treatment for MRSA infections. Performed at [facility], [place] [address], [place], [place] [address]   Culture, blood (routine x 2)     Status: None   Collection Time: 02/15/18  2:29 AM  Result Value Ref Range Status   Specimen Description   Final    BLOOD RIGHT HAND Performed at [facility] [address], [place], [place] [address]    Special Requests   Final    BOTTLES DRAWN AEROBIC AND ANAEROBIC Blood Culture adequate volume Performed at [facility] [address] [place], [place] [address]    Culture   Final    NO GROWTH 5 DAYS Performed at [facility], [place] [address], [place], [place] [address]    Report Status 02/20/2018 FINAL  Final  Culture, blood (routine x 2)     Status: None   Collection Time: 02/15/18  2:29 AM  Result Value Ref Range Status   Specimen Description   Final    BLOOD LEFT HAND Performed at [facility] [address] [place], [place] [address]    Special Requests   Final    BOTTLES DRAWN AEROBIC ONLY Blood Culture adequate volume Performed at [facility] [address], [place], [place] [address]    Culture   Final    NO GROWTH 5 DAYS Performed at [facility], [place] [address] [place], [place] [address]    Report  Status 02/20/2018 FINAL  Final  Respiratory Panel by PCR     Status: None   Collection Time: 02/15/18  8:30 AM  Result Value Ref Range Status   Adenovirus NOT DETECTED NOT DETECTED Final   Coronavirus 229E NOT DETECTED NOT DETECTED Final   Coronavirus HKU1 NOT DETECTED NOT DETECTED Final   Coronavirus NL63 NOT DETECTED NOT DETECTED Final   Coronavirus OC43 NOT DETECTED NOT DETECTED Final   Metapneumovirus NOT DETECTED NOT DETECTED Final   Rhinovirus / Enterovirus NOT DETECTED NOT DETECTED Final   Influenza [place] NOT DETECTED NOT DETECTED Final   Influenza B NOT DETECTED NOT DETECTED Final   Parainfluenza Virus 1 NOT DETECTED NOT DETECTED Final   Parainfluenza Virus 2 NOT DETECTED NOT DETECTED Final   Parainfluenza Virus 3 NOT DETECTED NOT DETECTED Final   Parainfluenza Virus 4 NOT DETECTED NOT DETECTED Final   Respiratory Syncytial Virus NOT DETECTED NOT DETECTED Final   Bordetella pertussis NOT DETECTED NOT DETECTED Final   Chlamydophila pneumoniae NOT DETECTED NOT DETECTED Final   Mycoplasma pneumoniae NOT DETECTED NOT DETECTED Final    Comment: Performed at [facility], [place] [address] [place], [place] [address]         Radiology Studies: No results found.      Scheduled Meds: . diltiazem  300 mg Oral Daily  . insulin aspart  0-9 Units Subcutaneous TID WC  . ipratropium-albuterol  3 mL Nebulization TID  . levothyroxine  112 mcg Oral Q0600  . mouth rinse  15 mL Mouth Rinse BID  . metoprolol tartrate  12.5 mg Oral BID  . mometasone-formoterol  2 puff Inhalation BID  . Warfarin - Pharmacist Dosing Inpatient   Does not apply q1800   Continuous Infusions: . ceFEPime (MAXIPIME) IV 1 g (02/20/18 1720)     LOS: 6 days  Time spent: over 30 min    Fayrene Helper, MD [facility] Pager [phone]  If 7PM-7AM, please contact night-coverage [email or web address] Password [facility] 02/20/2018, 6:24 PM

## 2018-02-20 NOTE — Progress Notes (Signed)
ANTICOAGULATION CONSULT NOTE - Follow-Up Consult  Pharmacy Consult for warfarin Indication: atrial fibrillation  No Known Allergies  Patient Measurements: Height: 6\' 3"  (190.5 cm) Weight: 219 lb 9.3 oz (99.6 kg) IBW/kg (Calculated) : 84.5   Vital Signs: Temp: 97.7 F (36.5 C) (12/04 0437) Temp Source: Oral (12/04 0437) BP: 131/104 (12/04 0437) Pulse Rate: 108 (12/04 0437)  Labs: Recent Labs    02/18/18 0538 02/19/18 0535 02/20/18 0527  HGB 13.4  --  13.7  HCT 42.7  --  43.6  PLT 459*  --  516*  LABPROT 26.1* 19.3* 20.2*  INR 2.43 1.64 1.75  CREATININE 1.04  --  0.97    Estimated Creatinine Clearance: 69 mL/min (by C-G formula based on SCr of 0.97 mg/dL).   Assessment: Pharmacy is consulted to dose warfarin in 84 yo male with PMH of a.fib. Pt was admitted with elevated INR, but INR was allowed to trend down to therapeutic range.   Today, 02/20/18   INR  1.75, subtherapeutic after not receiving doses for 3 days prior to 12/2; moving in the right direction  Home dose 5 mg qday X 7.5 mg TThSat  No bleeding issues noted per chart review  Hgb 13.7, Plt 516    Goal of Therapy:  INR 2-3 Monitor platelets by anticoagulation protocol: Yes   Plan:   Warfarin 5 mg PO x 1  Daily INR   Monitor for signs and symptoms of bleeding  Eudelia Bunch, Pharm.D [phone] 02/20/2018 11:08 AM

## 2018-02-21 ENCOUNTER — Inpatient Hospital Stay (HOSPITAL_COMMUNITY): Payer: Medicare Other

## 2018-02-21 DIAGNOSIS — I2510 Atherosclerotic heart disease of native coronary artery without angina pectoris: Secondary | ICD-10-CM

## 2018-02-21 DIAGNOSIS — J42 Unspecified chronic bronchitis: Secondary | ICD-10-CM

## 2018-02-21 DIAGNOSIS — N183 Chronic kidney disease, stage 3 (moderate): Secondary | ICD-10-CM

## 2018-02-21 LAB — COMPREHENSIVE METABOLIC PANEL
ALBUMIN: 2.4 g/dL — AB (ref 3.5–5.0)
ALT: 23 U/L (ref 0–44)
AST: 19 U/L (ref 15–41)
Alkaline Phosphatase: 112 U/L (ref 38–126)
Anion gap: 7 (ref 5–15)
BUN: 22 mg/dL (ref 8–23)
CALCIUM: 7.8 mg/dL — AB (ref 8.9–10.3)
CO2: 27 mmol/L (ref 22–32)
Chloride: 107 mmol/L (ref 98–111)
Creatinine, Ser: 0.98 mg/dL (ref 0.61–1.24)
GFR calc Af Amer: >60 mL/min (ref >60)
GFR calc non Af Amer: >60 mL/min (ref >60)
Glucose, Bld: 146 mg/dL — ABNORMAL HIGH (ref 70–99)
Potassium: 3.9 mmol/L (ref 3.5–5.1)
Sodium: 141 mmol/L (ref 135–145)
Total Bilirubin: 1.6 mg/dL — ABNORMAL HIGH (ref 0.3–1.2)
Total Protein: 4.9 g/dL — ABNORMAL LOW (ref 6.5–8.1)

## 2018-02-21 LAB — CBC
HCT: 41.2 % (ref 39.0–52.0)
Hemoglobin: 13 g/dL (ref 13.0–17.0)
MCH: 31.6 pg (ref 26.0–34.0)
MCHC: 31.6 g/dL (ref 30.0–36.0)
MCV: 100 fL (ref 80.0–100.0)
Platelets: 453 10*3/uL — ABNORMAL HIGH (ref 150–400)
RBC: 4.12 MIL/uL — ABNORMAL LOW (ref 4.22–5.81)
RDW: 14.8 % (ref 11.5–15.5)
WBC: 17.8 10*3/uL — AB (ref 4.0–10.5)
nRBC: 0 % (ref 0.0–0.2)

## 2018-02-21 LAB — PROTIME-INR
INR: 1.71
Prothrombin Time: 19.9 seconds — ABNORMAL HIGH (ref 11.4–15.2)

## 2018-02-21 LAB — GLUCOSE, CAPILLARY
Glucose-Capillary: 127 mg/dL — ABNORMAL HIGH (ref 70–99)
Glucose-Capillary: 160 mg/dL — ABNORMAL HIGH (ref 70–99)
Glucose-Capillary: 93 mg/dL (ref 70–99)

## 2018-02-21 LAB — PROCALCITONIN

## 2018-02-21 LAB — MAGNESIUM: Magnesium: 1.4 mg/dL — ABNORMAL LOW (ref 1.7–2.4)

## 2018-02-21 MED ORDER — WARFARIN SODIUM 5 MG PO TABS
7.5000 mg | ORAL_TABLET | Freq: Once | ORAL | Status: AC
  Administered 2018-02-21: 7.5 mg via ORAL
  Filled 2018-02-21: qty 1

## 2018-02-21 MED ORDER — MAGNESIUM SULFATE 2 GM/50ML IV SOLN
2.0000 g | Freq: Once | INTRAVENOUS | Status: AC
  Administered 2018-02-21: 2 g via INTRAVENOUS
  Filled 2018-02-21: qty 50

## 2018-02-21 MED ORDER — FUROSEMIDE 40 MG PO TABS
40.0000 mg | ORAL_TABLET | Freq: Every day | ORAL | Status: DC
  Administered 2018-02-21 – 2018-02-25 (×5): 40 mg via ORAL
  Filled 2018-02-21 (×6): qty 1

## 2018-02-21 NOTE — Consult Note (Signed)
Consultation Note Date: 02/21/2018   Patient Name: Justin Garner  DOB: Aug 16, 1932  MRN: [id number]  Age / Sex: 84 y.o., male  PCP: [name], MD Referring Physician: Alma Friendly, MD  Reason for Consultation: Establishing goals of care  HPI/Patient Profile: 84 y.o. male  with past medical history of CAD, CKD stage III, COPD, chronic systolic CHF, type 2 diabetes, GERD, hypertension, hyperlipidemia, paroxysmal A. fib on Coumadin, history of lung cancer status post right upper lobe lobectomy in 2009 prior PE  admitted on 02/14/2018 with shortness of breath.  He was recently admitted Nov 18-25.  Being treated for HCAP with concern for recurrent aspiration.  Palliative consulted for goals of care.  Clinical Assessment and Goals of Care: I met today with Justin Garner.  He seems mildly confused and cannot relate the events surrounding this admission.  He is very focused on going home.  When asked any question, his only response is something related to being allowed to return home.  I called numbers listed on chart an was able to reach his granddaughter.  I introduced palliative care as specialized medical care for people living with serious illness. It focuses on providing relief from the symptoms and stress of a serious illness. The goal is to improve quality of life for both the patient and the family.  I offered for meeting with family tomorrow, and she reports that she works for the next several days and is unable to meet.  His granddaughter reports that family familiar with palliative care and remains hopeful that he is going to improve.  Their goal is for him to transition to rehab at SNF to see if he can regain functional status.      SUMMARY OF RECOMMENDATIONS   - Called numbers on chart and was able to reach patient's granddaughter.  She reports that family is invested in continuation of current  therapies and is hopeful for improvement.  I offered to meet in person for family meeting to discuss goals of care and she reports that she is unavailable to meet tomorrow.  I am hopeful to meet with other family tomorrow if they come to visit (son was noted to be here earlier). - Would recommend palliative follow-up at SNF if he is ready to discharge prior to family meeting or if they are unable/decline to meet in the hospital.  Code Status/Advance Care Planning: DNR   Palliative Prophylaxis:   Aspiration and Delirium Protocol  Prognosis:   Guarded  Discharge Planning: [place] for rehab with Palliative care service follow-up      Primary Diagnoses: Present on Admission: . AKI (acute kidney injury) ([place]) . Acute respiratory failure with hypoxia ([place]) . Atrial fibrillation with RVR ([place]) . Chronic systolic CHF (congestive heart failure) ([place]) . CAD in native artery . HLD (hyperlipidemia) . Essential hypertension, benign . Hypothyroidism . Acute metabolic encephalopathy   I have reviewed the medical record, interviewed the patient and family, and examined the patient. The following aspects are pertinent.  Past  Medical History:  Diagnosis Date  . ASCVD (arteriosclerotic cardiovascular disease)   . Cancer [facility]) 2009   Upper lobectomy;radiation; chemotherapy  . Chronic systolic CHF (congestive heart failure) ([place])   . CKD (chronic kidney disease), stage II   . COPD (chronic obstructive pulmonary disease) ([place])   . Coronary artery disease   . Diabetes mellitus   . GERD (gastroesophageal reflux disease)   . Hyperlipidemia   . Hypertension   . Leukocytosis 07/12/2015  . PAF (paroxysmal atrial fibrillation) ([facility])    Chronic warfarin therapy  . 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  Pulmonary embolism ([facility])    PMH of  . Thyroid disease    Social History   Socioeconomic History  . Marital status: Married    Spouse name: Not on file  . Number of children: Not on file  . Years of education: Not on file  . Highest education level: Not on file  Occupational History  . Occupation: retired  Scientific laboratory technician  . Financial resource strain: Not on file  . Food insecurity:    Worry: Not on file    Inability: Not on file  . Transportation needs:    Medical: Not on file    Non-medical: Not on file  Tobacco Use  . Smoking status: Former Smoker    Packs/day: 1.00    Years: 50.00    Pack years: 50.00    Types: Cigarettes    Last attempt to quit: 03/20/2001    Years since quitting: 16.9  . Smokeless tobacco: Former Network engineer and Sexual Activity  . Alcohol use: No  . Drug use: No  . Sexual activity: Not on file  Lifestyle  . Physical activity:    Days per week: Not on file    Minutes per session: Not on file  . Stress: Not on file  Relationships  . Social connections:    Talks on phone: Not on file    Gets together: Not on file    Attends religious service: Not on file    Active member of club or organization: Not on file    Attends meetings of clubs or organizations: Not on file    Relationship status: Not on file  Other Topics Concern  . Not on file  Social History Narrative  . Not on file   Family History  Problem Relation Age of Onset  . Heart disease Father        Heart attack  . Heart attack Father   . Diabetes Brother   . Cancer Neg Hx   . Stroke Neg Hx   . Hypertension Neg Hx    Scheduled Meds: . diltiazem  300 mg Oral Daily  . insulin aspart  0-9 Units Subcutaneous TID WC  . ipratropium-albuterol  3 mL Nebulization TID  . levothyroxine  112 mcg Oral Q0600  . mouth rinse  15 mL Mouth Rinse BID  . metoprolol tartrate  12.5 mg Oral BID  . mometasone-formoterol  2 puff Inhalation BID  . Warfarin - Pharmacist Dosing Inpatient   Does not  apply q1800   Continuous Infusions: . ceFEPime (MAXIPIME) IV 1 g (02/21/18 0113)  . magnesium sulfate 1 - 4 g bolus IVPB 2 g (02/21/18 0854)   PRN Meds:.acetaminophen **OR** acetaminophen, albuterol, bisacodyl Medications Prior to Admission:  Prior to Admission  medications   Medication Sig Start Date End Date Taking? Authorizing Provider  acetaminophen (TYLENOL) 325 MG tablet Take 2 tablets (650 mg total) by mouth every 6 (six) hours as needed for mild pain (or Fever >/= 101). 02/11/18  Yes Nita Sells, MD  albuterol (PROVENTIL HFA;VENTOLIN HFA) 108 (90 Base) MCG/ACT inhaler Inhale 2 puffs into the lungs every 6 (six) hours as needed for wheezing or shortness of breath. 11/16/15  Yes Mikhail, [place], DO  Artificial Tear Ointment (DRY EYES OP) Place 1 drop into both eyes as needed (dry eye).   Yes [provider]  atorvastatin (LIPITOR) 20 MG tablet TAKE 1 TABLET BY MOUTH  DAILY Patient taking differently: Take 20 mg by mouth daily at 6 PM.  05/09/17  Yes Jettie Booze, MD  budesonide-formoterol [facility]) 80-4.5 MCG/ACT inhaler Inhale 2 puffs into the lungs every morning. 11/16/15  Yes Mikhail, Velta Addison, DO  diltiazem (CARDIZEM CD) 360 MG 24 hr capsule Take 1 capsule (360 mg total) by mouth daily. 09/10/17  Yes Jettie Booze, MD  fish oil-omega-3 fatty acids 1000 MG capsule Take 1 g by mouth daily.    Yes [provider]  furosemide (LASIX) 20 MG tablet Take 2 tablets (40 mg total) by mouth daily. 06/27/17  Yes Simmons, Brittainy M, PA-C  glipiZIDE (GLUCOTROL) 10 MG tablet Take 10 mg by mouth daily before breakfast.   Yes [provider]  levothyroxine (SYNTHROID, LEVOTHROID) 112 MCG tablet Take 112 mcg by mouth daily.     Yes [provider]  lisinopril (PRINIVIL,ZESTRIL) 5 MG tablet Take 0.5 tablets (2.5 mg total) by mouth daily. 01/03/17  Yes Jettie Booze, MD  metoprolol tartrate (LOPRESSOR) 25 MG tablet TAKE ONE-HALF TABLET BY   MOUTH TWO TIMES DAILY Patient taking differently: Take 12.5 mg by mouth 2 (two) times daily.  05/09/17  Yes Jettie Booze, MD  mirtazapine (REMERON) 15 MG tablet Take 15 mg by mouth at bedtime.   Yes [provider]  nitroGLYCERIN (NITROSTAT) 0.4 MG SL tablet Place 1 tablet (0.4 mg total) under the tongue every 5 (five) minutes as needed for chest pain up to 3 doses 08/03/16  Yes Jettie Booze, MD  OLANZapine (ZYPREXA) 2.5 MG tablet Take 1 tablet (2.5 mg total) by mouth at bedtime. 02/11/18  Yes Nita Sells, MD  omeprazole (PRILOSEC) 20 MG capsule Take 20 mg by mouth daily.    Yes [provider]  sennosides-docusate sodium (SENOKOT-S) 8.6-50 MG tablet Take 1 tablet by mouth daily as needed for constipation.    Yes [provider]  warfarin (COUMADIN) 5 MG tablet TAKE AS DIRECTED BY  COUMADIN CLINIC Patient taking differently: Take 5 mg by mouth daily.  12/03/17  Yes Jettie Booze, MD   No Known Allergies Review of Systems  Denies any symptoms.  Repeatedly asks to go home.  Physical Exam General: Alert, awake, in no acute distress.  HEENT: No bruits, no goiter, no JVD Heart: Regular rate and rhythm. No murmur appreciated. Lungs: Fair air movement, clear Abdomen: Soft, nontender, nondistended, positive bowel sounds.  Ext: No significant edema Skin: Warm and dry Neuro: Grossly intact, nonfocal.   Vital Signs: BP 98/76 (BP Location: Right Arm)   Pulse (!) 52   Temp 97.7 F (36.5 C) (Oral)   Resp 20   Ht _0  (1.905 m)   Wt 99.6 kg   SpO2 96%   BMI 27.45 kg/m  Pain Scale: 0-10 POSS *See Group Information*: S-Acceptable,Sleep, easy  to arouse Pain Score: 0-No pain   SpO2: SpO2: 96 % O2 Device:SpO2: 96 % O2 Flow Rate: .O2 Flow Rate (L/min): 3 L/min  IO: Intake/output summary:   Intake/Output Summary (Last 24 hours) at 02/21/2018 0912 Last data filed at 02/21/2018 0453 Gross per 24 hour  Intake 430 ml  Output 725 ml    Net -295 ml    LBM: Last BM Date: 02/20/18 Baseline Weight: Weight: 99.6 kg Most recent weight: Weight: 99.6 kg     Palliative Assessment/Data:   Flowsheet Rows     Most Recent Value  Intake Tab  Referral Department  Hospitalist  Unit at Time of Referral  Med/Surg Unit  Palliative Care Primary Diagnosis  Sepsis/Infectious Disease [Aspiration]  Date Notified  02/19/18  Palliative Care Type  Return patient Palliative Care  Reason for referral  Clarify Goals of Care  Date of Admission  02/14/18  Date first seen by Palliative Care  02/20/18  # of days Palliative referral response time  1 Day(s)  # of days IP prior to Palliative referral  5  Clinical Assessment  Palliative Performance Scale Score  30%  Pain Max last 24 hours  Not able to report  Pain Min Last 24 hours  Not able to report  Dyspnea Max Last 24 Hours  Not able to report  Dyspnea Min Last 24 hours  Not able to report  Psychosocial & Spiritual Assessment  Palliative Care Outcomes  Patient/Family meeting held?  No     Time Total: 60 Greater than 50%  of this time was spent counseling and coordinating care related to the above assessment and plan.  Signed by: Micheline Rough, MD   Please contact Palliative Medicine Team phone at [phone] for questions and concerns.  For individual provider: See Shea Evans

## 2018-02-21 NOTE — Progress Notes (Signed)
PROGRESS NOTE    Justin Garner  RPR:[id number] DOB: 26-Nov-1932 DOA: 02/14/2018 PCP: [name], MD   Brief Narrative:  84 y.o.malewith medical history significant ofCAD, CKD stage III, COPD, chronic systolic CHF, type 2 diabetes, GERD, hypertension, hyperlipidemia, paroxysmal A. fib on Coumadin, history of lung cancer status post right upper lobe lobectomy in 2009 prior PE presenting to the hospital for evaluation of shortness of breath and blood in Foley catheter. Patient was recently admitted from February 04, 2018 to February 11, 2018 for shortness of breath, confusion, and rhinovirus infection. He was discharged home on 2 L oxygen via nasal cannula.   He was found to require higher doses of oxygen.  Was noted to have urinary retention requiring a change in Foley catheter.  Chest x-ray showed small pneumothorax.  CT scan showed the same and also showed evidence for pneumonia.  Patient was hospitalized and started on broad-spectrum antibiotics.     Assessment & Plan:   Principal Problem:   Acute respiratory failure with hypoxia (HCC) Active Problems:   Pneumothorax   Essential hypertension, benign   UTI (urinary tract infection)   Chronic systolic CHF (congestive heart failure) (HCC)   Hypothyroidism   HLD (hyperlipidemia)   CAD in native artery   Supratherapeutic INR   COPD (chronic obstructive pulmonary disease) (HCC)   Atrial fibrillation with RVR (HCC)   Acute metabolic encephalopathy   AKI (acute kidney injury) ([place])   HCAP (healthcare-associated pneumonia)   CKD (chronic kidney disease) stage 3, GFR 30-59 ml/min (HCC)   QT prolongation   Type 2 diabetes mellitus ([place])   Dementia ([place])   Physical deconditioning   Healthcare associated pneumonia with acute hypoxic respiratory failure/pneumothorax/sepsis, present on admission Patient seen by pulmonology for pneumonthorax Did not require any intervention for the pneumothorax (last note from 12/2 who note if pt  requires chest tube, he'll require pleurodesis).  Suspicion is that one of blebs ruptured. CXR from 12/2 with improved "tiny" L apical pneumonthorax Seen by speech on 11/30, no over s/s of aspiration or penetration, recommended dysphagia 3 diet with thin liquids Negative MRSA PCR Negative blood cx x 5 days Vancomycin d/c'd on 11/30 Will d/c Cefepime 11/28 - 12/5, completed 8 days with repeat procalcitonin <0.10 Repeat CXR for 12/5 showed pulm vascular congestion, with stable pneumothorax Re-start pt PO lasix  Chronic atrial fibrillation with RVR Patient with known history of atrial fibrillation.   Continue metoprolol and diltiazem  Echo 09/2016 with EF 35-40% TSH 4.5 on 11/19 with normal free T4 Chadsvasc 6  Continue warfarin per pharmacy   Hypomagnesemia  Replace prn  Acute kidney injury on chronic kidney disease stage III with hematuria/acute urinary retention Resolved Patient had a indwelling Foley catheter which was placed during his previous hospitalization.   Apparently the catheter was malpositioned resulting in retention, which was thought to be reason for his AKI Catheter was replaced in the emergency department.  Seen by Dr. Diona Fanti, without any recommendation for intervention Renal US without any hydro (but some mild-mod bladder distention (11/29) Needs outpatient urology follow up  Abnormal UA  Culture without any growth.  Abnormal UA was most likely due to hematuria.  No evidence for UTI.   Follow up with urology as outpatient  QT prolongation on EKG Caution with QT prolonging meds Follow repeat EKG as outpatient Follow mag/k  Acute metabolic encephalopathy A&Ox2, likely some delirium in setting of acute hospitalization Delirium precautuins  History of COPD Currently not wheezing.  Continue home medications.  Chronic systolic CHF Ejection fraction noted to be 35 to 40% based on echo from June 2018 Restart lasix as recent CXR showed pulm vascular  congestion  History of coronary artery disease Stable.  Continue current medications.  Essential hypertension Blood pressure soft but stable.  Type 2 diabetes mellitus, controlled HbA1c 6.0 on 02/07/2018.  Continue SSI.  Continue to monitor CBGs  History of dementia with history of behavioral disturbances Stable currently.  Patient was started on Zyprexa during his previous hospitalization.  Currently on hold due to prolonged QT.  History of hypothyroidism Synthroid  Physical deconditioning PT and OT evaluation. Will need to go to skilled nursing facility.  DVT prophylaxis:warfarin Code Status: DNR Family Communication: None at bedside Disposition Plan: SNF   Consultants:   Pulm  Palliative care  Urology  Procedures:   Replacement of foley catheter  Antimicrobials:  Anti-infectives (From admission, onward)   Start     Dose/Rate Route Frequency Ordered Stop   02/17/18 1800  ceFEPIme (MAXIPIME) 1 g in sodium chloride 0.9 % 100 mL IVPB     1 g 200 mL/hr over 30 Minutes Intravenous Every 8 hours 02/17/18 1052     02/16/18 0800  ceFEPIme (MAXIPIME) 1 g in sodium chloride 0.9 % 100 mL IVPB  Status:  Discontinued     1 g 200 mL/hr over 30 Minutes Intravenous Every 12 hours 02/16/18 0513 02/17/18 1052   02/16/18 0600  vancomycin (VANCOCIN) IVPB 1000 mg/200 mL premix  Status:  Discontinued     1,000 mg 200 mL/hr over 60 Minutes Intravenous Every 24 hours 02/16/18 0513 02/16/18 1031   02/15/18 2000  ceFEPIme (MAXIPIME) 1 g in sodium chloride 0.9 % 100 mL IVPB  Status:  Discontinued     1 g 200 mL/hr over 30 Minutes Intravenous Every 24 hours 02/15/18 0023 02/16/18 0513   02/15/18 0023  vancomycin variable dose per unstable renal function (pharmacist dosing)  Status:  Discontinued      Does not apply See admin instructions 02/15/18 0023 02/16/18 0514   02/14/18 2145  ceFEPIme (MAXIPIME) 1 g in sodium chloride 0.9 % 100 mL IVPB     1 g 200 mL/hr over 30 Minutes  Intravenous  Once 02/14/18 2130 02/14/18 2241   02/14/18 2145  vancomycin (VANCOCIN) 2,000 mg in sodium chloride 0.9 % 500 mL IVPB     2,000 mg 250 mL/hr over 120 Minutes Intravenous STAT 02/14/18 2131 02/15/18 0115         Subjective: Denies any new complaints. Met patient feeding himself breakfast  Objective: Vitals:   02/21/18 0518 02/21/18 0959 02/21/18 1157 02/21/18 1325  BP: 98/76 98/79 107/70   Pulse: (!) 52 66 (!) 55   Resp: 20     Temp: 97.7 F (36.5 C) 97.7 F (36.5 C) 97.6 F (36.4 C)   TempSrc: Oral Oral Oral   SpO2: 96%  98% 97%  Weight:      Height:        Intake/Output Summary (Last 24 hours) at 02/21/2018 1658 Last data filed at 02/21/2018 0900 Gross per 24 hour  Intake 330 ml  Output 575 ml  Net -245 ml   Filed Weights   02/17/18 1100  Weight: 99.6 kg    Examination:  General: NAD   Cardiovascular: S1, S2 present  Respiratory: CTAB, diminished BS bilaterally  Abdomen: Soft, nontender, nondistended, bowel sounds present  Musculoskeletal: No bilateral pedal edema noted  Skin: Normal  Psychiatry: Normal mood    Data Reviewed:  I have personally reviewed following labs and imaging studies  CBC: Recent Labs  Lab 02/14/18 2107  02/16/18 0339 02/17/18 0539 02/18/18 0538 02/20/18 0527 02/21/18 0546  WBC 31.9*   < > 31.8* 18.8* 15.5* 20.4* 17.8*  NEUTROABS 28.3*  --   --   --   --   --   --   HGB 14.4   < > 12.5* 12.5* 13.4 13.7 13.0  HCT 44.4   < > 38.3* 39.7 42.7 43.6 41.2  MCV 98.2   < > 95.5 99.5 98.2 98.0 100.0  PLT 325   < > 384 402* 459* 516* 453*   < > = values in this interval not displayed.   Basic Metabolic Panel: Recent Labs  Lab 02/15/18 0229 02/16/18 0339 02/17/18 0539 02/18/18 0538 02/20/18 0527 02/21/18 0546  NA 140 140 141 142 142 141  K 3.8 3.4* 3.6 3.7 3.7 3.9  CL 105 106 107 107 107 107  CO2 17* _0 GLUCOSE 114* 218* 114* 156* 140* 146*  BUN 66* 56* 40* 30* 23 22  CREATININE 3.86* 1.89*  1.24 1.04 0.97 0.98  CALCIUM 7.9* 7.8* 7.7* 8.0* 8.0* 7.8*  MG 1.8  --   --   --   --  1.4*   GFR: Estimated Creatinine Clearance: 68.3 mL/min (by C-G formula based on SCr of 0.98 mg/dL). Liver Function Tests: Recent Labs  Lab 02/21/18 0546  AST 19  ALT 23  ALKPHOS 112  BILITOT 1.6*  PROT 4.9*  ALBUMIN 2.4*   No results for input(s): LIPASE, AMYLASE in the last 168 hours. No results for input(s): AMMONIA in the last 168 hours. Coagulation Profile: Recent Labs  Lab 02/17/18 0539 02/18/18 0538 02/19/18 0535 02/20/18 0527 02/21/18 0546  INR 3.72 2.43 1.64 1.75 1.71   Cardiac Enzymes: No results for input(s): CKTOTAL, CKMB, CKMBINDEX, TROPONINI in the last 168 hours. BNP (last 3 results) No results for input(s): PROBNP in the last 8760 hours. HbA1C: No results for input(s): HGBA1C in the last 72 hours. CBG: Recent Labs  Lab 02/20/18 1135 02/20/18 1721 02/20/18 2115 02/21/18 0725 02/21/18 1131  GLUCAP 209* 99 126* 127* 160*   Lipid Profile: No results for input(s): CHOL, HDL, LDLCALC, TRIG, CHOLHDL, LDLDIRECT in the last 72 hours. Thyroid Function Tests: No results for input(s): TSH, T4TOTAL, FREET4, T3FREE, THYROIDAB in the last 72 hours. Anemia Panel: No results for input(s): VITAMINB12, FOLATE, FERRITIN, TIBC, IRON, RETICCTPCT in the last 72 hours. Sepsis Labs: Recent Labs  Lab 02/14/18 2146 02/14/18 2354 02/21/18 0546  PROCALCITON  --   --  <0.10  LATICACIDVEN 1.52 1.23  --     Recent Results (from the past 240 hour(s))  Urine culture     Status: None   Collection Time: 02/14/18 10:11 PM  Result Value Ref Range Status   Specimen Description   Final    URINE, CATHETERIZED Performed at [facility] [address], [place], [place] [address]    Special Requests   Final    NONE Performed at [facility], [place] [address] [place], [place] [address]    Culture   Final    NO GROWTH Performed at [facility], [place] [address], [place], [place] [address]    Report Status 02/16/2018 FINAL  Final  MRSA PCR Screening     Status: None   Collection Time: 02/15/18  2:25 AM  Result Value Ref Range Status  MRSA by PCR NEGATIVE NEGATIVE Final    Comment:        The GeneXpert MRSA Assay (FDA approved for NASAL specimens only), is one component of a comprehensive MRSA colonization surveillance program. It is not intended to diagnose MRSA infection nor to guide or monitor treatment for MRSA infections. Performed at [facility], [place] [address], [place], [place] [address]   Culture, blood (routine x 2)     Status: None   Collection Time: 02/15/18  2:29 AM  Result Value Ref Range Status   Specimen Description   Final    BLOOD RIGHT HAND Performed at [facility] [address], [place], [place] [address]    Special Requests   Final    BOTTLES DRAWN AEROBIC AND ANAEROBIC Blood Culture adequate volume Performed at [facility] [address] [place], [place] [address]    Culture   Final    NO GROWTH 5 DAYS Performed at [facility], [place] [address], [place], [place] [address]    Report Status 02/20/2018 FINAL  Final  Culture, blood (routine x 2)     Status: None   Collection Time: 02/15/18  2:29 AM  Result Value Ref Range Status   Specimen Description   Final    BLOOD LEFT HAND Performed at [facility] [address], [place], [place] [address]    Special Requests   Final    BOTTLES DRAWN AEROBIC ONLY Blood Culture adequate volume Performed at [facility] [address], [place], [place] [address]    Culture   Final    NO GROWTH 5 DAYS Performed at [facility], [place] [address], [place], [place] [address]    Report Status 02/20/2018 FINAL  Final  Respiratory Panel by PCR     Status: None   Collection Time: 02/15/18  8:30 AM  Result Value Ref Range Status   Adenovirus  NOT DETECTED NOT DETECTED Final   Coronavirus 229E NOT DETECTED NOT DETECTED Final   Coronavirus HKU1 NOT DETECTED NOT DETECTED Final   Coronavirus NL63 NOT DETECTED NOT DETECTED Final   Coronavirus OC43 NOT DETECTED NOT DETECTED Final   Metapneumovirus NOT DETECTED NOT DETECTED Final   Rhinovirus / Enterovirus NOT DETECTED NOT DETECTED Final   Influenza A NOT DETECTED NOT DETECTED Final   Influenza B NOT DETECTED NOT DETECTED Final   Parainfluenza Virus 1 NOT DETECTED NOT DETECTED Final   Parainfluenza Virus 2 NOT DETECTED NOT DETECTED Final   Parainfluenza Virus 3 NOT DETECTED NOT DETECTED Final   Parainfluenza Virus 4 NOT DETECTED NOT DETECTED Final   Respiratory Syncytial Virus NOT DETECTED NOT DETECTED Final   Bordetella pertussis NOT DETECTED NOT DETECTED Final   Chlamydophila pneumoniae NOT DETECTED NOT DETECTED Final   Mycoplasma pneumoniae NOT DETECTED NOT DETECTED Final    Comment: Performed at [facility], [place] [address], [place], [place] [address]         Radiology Studies: Dg Chest 2 View  Result Date: 02/21/2018 CLINICAL DATA:  Hypoxia. EXAM: CHEST - 2 VIEW COMPARISON:  02/18/2018. FINDINGS: Cardiomegaly. Diffuse bilateral pulmonary interstitial prominence and bilateral pleural effusions. These findings are consistent with congestive heart failure. Stable left apical pneumothorax. IMPRESSION: 1. Cardiomegaly. Diffuse bilateral from interstitial prominence and bilateral pleural effusions. Findings consistent with CHF. 2.  Stable known left apical pneumothorax. Electronically Signed   By: Marcello Moores  Register   On: 02/21/2018 13:34  Scheduled Meds: . diltiazem  300 mg Oral Daily  . insulin aspart  0-9 Units Subcutaneous TID WC  . ipratropium-albuterol  3 mL Nebulization TID  . levothyroxine  112 mcg Oral Q0600  . mouth rinse  15 mL Mouth Rinse BID  . metoprolol tartrate  12.5 mg Oral BID  . mometasone-formoterol  2 puff Inhalation BID  . warfarin  7.5 mg  Oral ONCE-1800  . Warfarin - Pharmacist Dosing Inpatient   Does not apply q1800   Continuous Infusions: . ceFEPime (MAXIPIME) IV 1 g (02/21/18 1126)     LOS: 7 days       Alma Friendly, MD [facility]   If 7PM-7AM, please contact night-coverage 02/21/2018, 4:58 PM

## 2018-02-21 NOTE — Care Management Important Message (Signed)
Important Message  Patient Details  Name: JOSHUAL TERRIO MRN: [id number] Date of Birth: 14-Aug-1932   Medicare Important Message Given:  Yes    Kerin Salen 02/21/2018, 11:48 AMImportant Message  Patient Details  Name: LOUISE VICTORY MRN: [id number] Date of Birth: 08/31/1932   Medicare Important Message Given:  Yes    Kerin Salen 02/21/2018, 11:47 AM

## 2018-02-21 NOTE — Progress Notes (Signed)
PT Cancellation Note  Patient Details Name: Justin Garner MRN: [id number] DOB: [birth year]   Cancelled Treatment:     pt out of room for X ray.  Pt has been evaluated with rec for SNF   Rica Koyanagi  PTA Acute  Rehabilitation Services Pager      [phone] Office      [phone]

## 2018-02-21 NOTE — Progress Notes (Signed)
ANTICOAGULATION CONSULT NOTE - Follow-Up Consult  Pharmacy Consult for warfarin Indication: atrial fibrillation  No Known Allergies  Patient Measurements: Height: 6\' 3"  (190.5 cm) Weight: 219 lb 9.3 oz (99.6 kg) IBW/kg (Calculated) : 84.5   Vital Signs: Temp: 97.7 F (36.5 C) (12/05 0518) Temp Source: Oral (12/05 0518) BP: 98/76 (12/05 0518) Pulse Rate: 52 (12/05 0518)  Labs: Recent Labs    02/19/18 0535 02/20/18 0527 02/21/18 0546  HGB  --  13.7 13.0  HCT  --  43.6 41.2  PLT  --  516* 453*  LABPROT 19.3* 20.2* 19.9*  INR 1.64 1.75 1.71  CREATININE  --  0.97 0.98    Estimated Creatinine Clearance: 68.3 mL/min (by C-G formula based on SCr of 0.98 mg/dL).   Assessment: Pharmacy is consulted to dose warfarin in 84 yo male with PMH of a.fib. Pt was admitted with elevated INR, but INR was allowed to trend down to therapeutic range.   Today, 02/21/18   INR  1.71, subtherapeutic 3 doses of 5 mg after not receiving doses for 3 days prior to 12/2;   Home dose 5 mg qday X 7.5 mg TThSat  No bleeding issues noted per chart review  Hgb 13, Plt 453    Goal of Therapy:  INR 2-3 Monitor platelets by anticoagulation protocol: Yes   Plan:   Warfarin 7.5 mg PO x 1  Daily INR   Monitor for signs and symptoms of bleeding  Eudelia Bunch, Pharm.D [phone] 02/21/2018 9:27 AM

## 2018-02-21 NOTE — Progress Notes (Signed)
Patient's BP in the 90s and HR-66 cardizem and metoprolol due, notified Dr. Horris Latino notified okay to hold this AM dose.

## 2018-02-22 LAB — BASIC METABOLIC PANEL
ANION GAP: 11 (ref 5–15)
BUN: 24 mg/dL — ABNORMAL HIGH (ref 8–23)
CO2: 26 mmol/L (ref 22–32)
Calcium: 8 mg/dL — ABNORMAL LOW (ref 8.9–10.3)
Chloride: 101 mmol/L (ref 98–111)
Creatinine, Ser: 1.14 mg/dL (ref 0.61–1.24)
GFR calc non Af Amer: 59 mL/min — ABNORMAL LOW (ref >60)
GLUCOSE: 177 mg/dL — AB (ref 70–99)
Potassium: 3.8 mmol/L (ref 3.5–5.1)
Sodium: 138 mmol/L (ref 135–145)

## 2018-02-22 LAB — CBC WITH DIFFERENTIAL/PLATELET
Abs Immature Granulocytes: 0.73 10*3/uL — ABNORMAL HIGH (ref 0.00–0.07)
BASOS ABS: 0.1 10*3/uL (ref 0.0–0.1)
Basophils Relative: 0 %
Eosinophils Absolute: 0.2 10*3/uL (ref 0.0–0.5)
Eosinophils Relative: 1 %
HEMATOCRIT: 43 % (ref 39.0–52.0)
Hemoglobin: 13.8 g/dL (ref 13.0–17.0)
Immature Granulocytes: 4 %
Lymphocytes Relative: 7 %
Lymphs Abs: 1.4 10*3/uL (ref 0.7–4.0)
MCH: 31.3 pg (ref 26.0–34.0)
MCHC: 32.1 g/dL (ref 30.0–36.0)
MCV: 97.5 fL (ref 80.0–100.0)
Monocytes Absolute: 1.4 10*3/uL — ABNORMAL HIGH (ref 0.1–1.0)
Monocytes Relative: 7 %
Neutro Abs: 15.7 10*3/uL — ABNORMAL HIGH (ref 1.7–7.7)
Neutrophils Relative %: 81 %
Platelets: 487 10*3/uL — ABNORMAL HIGH (ref 150–400)
RBC: 4.41 MIL/uL (ref 4.22–5.81)
RDW: 14.7 % (ref 11.5–15.5)
WBC: 19.6 10*3/uL — ABNORMAL HIGH (ref 4.0–10.5)
nRBC: 0 % (ref 0.0–0.2)

## 2018-02-22 LAB — GLUCOSE, CAPILLARY
Glucose-Capillary: 227 mg/dL — ABNORMAL HIGH (ref 70–99)
Glucose-Capillary: 165 mg/dL — ABNORMAL HIGH (ref 70–99)
Glucose-Capillary: 152 mg/dL — ABNORMAL HIGH (ref 70–99)

## 2018-02-22 LAB — PROTIME-INR
INR: 1.6
PROTHROMBIN TIME: 18.9 s — AB (ref 11.4–15.2)

## 2018-02-22 LAB — MAGNESIUM: Magnesium: 1.5 mg/dL — ABNORMAL LOW (ref 1.7–2.4)

## 2018-02-22 MED ORDER — MAGNESIUM SULFATE 2 GM/50ML IV SOLN
2.0000 g | Freq: Once | INTRAVENOUS | Status: AC
  Administered 2018-02-22: 2 g via INTRAVENOUS
  Filled 2018-02-22: qty 50

## 2018-02-22 MED ORDER — WARFARIN SODIUM 5 MG PO TABS
7.5000 mg | ORAL_TABLET | Freq: Once | ORAL | Status: AC
  Administered 2018-02-22: 7.5 mg via ORAL
  Filled 2018-02-22: qty 1

## 2018-02-22 NOTE — Progress Notes (Signed)
ANTICOAGULATION CONSULT NOTE - Follow-Up Consult  Pharmacy Consult for warfarin Indication: atrial fibrillation  No Known Allergies  Patient Measurements: Height: 6\' 3"  (190.5 cm) Weight: 219 lb 9.3 oz (99.6 kg) IBW/kg (Calculated) : 84.5   Vital Signs: Temp: 97.8 F (36.6 C) (12/06 0456) Temp Source: Oral (12/06 0456) BP: 123/97 (12/06 0916) Pulse Rate: 118 (12/06 0916)  Labs: Recent Labs    02/20/18 0527 02/21/18 0546 02/22/18 0641  HGB 13.7 13.0 13.8  HCT 43.6 41.2 43.0  PLT 516* 453* 487*  LABPROT 20.2* 19.9* 18.9*  INR 1.75 1.71 1.60  CREATININE 0.97 0.98 1.14    Estimated Creatinine Clearance: 58.7 mL/min (by C-G formula based on SCr of 1.14 mg/dL).   Assessment: Pharmacy is consulted to dose warfarin in 84 yo male with PMH of a.fib. Pt was admitted with elevated INR, but INR was allowed to trend down to therapeutic range. Home dose 5 mg qday except 7.5 mg TThSat  Today, 02/22/18   INR subtherapeutic and trending back down slightly   No bleeding issues noted per chart review  CBC stable wnl  Goal of Therapy:  INR 2-3 Monitor platelets by anticoagulation protocol: Yes   Plan:   Warfarin 7.5 mg PO x 1 tonight  Daily INR   Monitor for signs and symptoms of bleeding  Reuel Boom, PharmD, BCPS [phone] 02/22/2018, 2:36 PM

## 2018-02-22 NOTE — Progress Notes (Signed)
Patient refusing morning breathing txs. RT attempted to give patient Dulera, but patient is wanting to go home and refused txs. No distress noted. Bedside RN made aware.

## 2018-02-22 NOTE — Progress Notes (Signed)
PROGRESS NOTE    Justin Garner  XTG:[id number] DOB: [birth year] DOA: 02/14/2018 PCP: [name], MD   Brief Narrative:  84 y.o.malewith medical history significant ofCAD, CKD stage III, COPD, chronic systolic CHF, type 2 diabetes, GERD, hypertension, hyperlipidemia, paroxysmal A. fib on Coumadin, history of lung cancer status post right upper lobe lobectomy in 2009 prior PE presenting to the hospital for evaluation of shortness of breath and blood in Foley catheter. Patient was recently admitted from February 04, 2018 to February 11, 2018 for shortness of breath, confusion, and rhinovirus infection. He was discharged home on 2 L oxygen via nasal cannula.   He was found to require higher doses of oxygen.  Was noted to have urinary retention requiring a change in Foley catheter.  Chest x-ray showed small pneumothorax.  CT scan showed the same and also showed evidence for pneumonia.  Patient was hospitalized and started on broad-spectrum antibiotics.     Assessment & Plan:   Principal Problem:   Acute respiratory failure with hypoxia (HCC) Active Problems:   Pneumothorax   Essential hypertension, benign   UTI (urinary tract infection)   Chronic systolic CHF (congestive heart failure) (HCC)   Hypothyroidism   HLD (hyperlipidemia)   CAD in native artery   Supratherapeutic INR   COPD (chronic obstructive pulmonary disease) (HCC)   Atrial fibrillation with RVR (HCC)   Acute metabolic encephalopathy   AKI (acute kidney injury) ([place])   HCAP (healthcare-associated pneumonia)   CKD (chronic kidney disease) stage 3, GFR 30-59 ml/min (HCC)   QT prolongation   Type 2 diabetes mellitus ([place])   Dementia ([place])   Physical deconditioning   Healthcare associated pneumonia with acute hypoxic respiratory failure/pneumothorax/sepsis, present on admission Patient seen by pulmonology for pneumonthorax Did not require any intervention for the pneumothorax (last note from 12/2 who note if pt  requires chest tube, he'll require pleurodesis).  Suspicion is that one of blebs ruptured. CXR from 12/2 with improved "tiny" L apical pneumonthorax Seen by speech on 11/30, no over s/s of aspiration or penetration, recommended dysphagia 3 diet with thin liquids Negative MRSA PCR Negative blood cx x 5 days Vancomycin d/c'd on 11/30 Will d/c Cefepime 11/28 - 12/5, completed 8 days with repeat procalcitonin <0.10 Repeat CXR for 12/5 showed pulm vascular congestion, with stable pneumothorax Continue PO lasix  Leukocytosis Afebrile, with leukocytosis which appears chronic, ongoing for years Procalcitonin negative Will need outpt follow up with hematology  Chronic atrial fibrillation with RVR Patient with known history of atrial fibrillation.   Continue metoprolol and diltiazem  Echo 09/2016 with EF 35-40% TSH 4.5 on 11/19 with normal free T4 Chadsvasc 6  Continue warfarin per pharmacy   Hypomagnesemia  Replace prn  Acute kidney injury on chronic kidney disease stage III with hematuria/acute urinary retention Resolved Patient had a indwelling Foley catheter which was placed during his previous hospitalization.   Apparently the catheter was malpositioned resulting in retention, which was thought to be reason for his AKI Catheter was replaced in the emergency department.  Seen by Dr. Diona Fanti, without any recommendation for intervention Renal US without any hydro (but some mild-mod bladder distention (11/29) Needs outpatient urology follow up  Abnormal UA  Culture without any growth.  Abnormal UA was most likely due to hematuria.  No evidence for UTI.   Follow up with urology as outpatient  QT prolongation on EKG Caution with QT prolonging meds Follow repeat EKG as outpatient Follow mag/k  Acute metabolic encephalopathy A&Ox2, likely some  delirium in setting of acute hospitalization Delirium precautuins  History of COPD Currently not wheezing.  Continue home  medications.  Chronic systolic CHF Ejection fraction noted to be 35 to 40% based on echo from June 2018 Restart lasix as recent CXR showed pulm vascular congestion  History of coronary artery disease Stable.  Continue current medications.  Essential hypertension Blood pressure soft but stable.  Type 2 diabetes mellitus, controlled HbA1c 6.0 on 02/07/2018.  Continue SSI.  Continue to monitor CBGs  History of dementia with history of behavioral disturbances Stable currently.  Patient was started on Zyprexa during his previous hospitalization.  Currently on hold due to prolonged QT.  History of hypothyroidism Synthroid  Physical deconditioning PT and OT evaluation. Will need to go to skilled nursing facility.  DVT prophylaxis:warfarin Code Status: DNR Family Communication: None at bedside Disposition Plan: SNF   Consultants:   Pulm  Palliative care  Urology  Procedures:   Replacement of foley catheter  Antimicrobials:  Anti-infectives (From admission, onward)   Start     Dose/Rate Route Frequency Ordered Stop   02/17/18 1800  ceFEPIme (MAXIPIME) 1 g in sodium chloride 0.9 % 100 mL IVPB  Status:  Discontinued     1 g 200 mL/hr over 30 Minutes Intravenous Every 8 hours 02/17/18 1052 02/21/18 1718   02/16/18 0800  ceFEPIme (MAXIPIME) 1 g in sodium chloride 0.9 % 100 mL IVPB  Status:  Discontinued     1 g 200 mL/hr over 30 Minutes Intravenous Every 12 hours 02/16/18 0513 02/17/18 1052   02/16/18 0600  vancomycin (VANCOCIN) IVPB 1000 mg/200 mL premix  Status:  Discontinued     1,000 mg 200 mL/hr over 60 Minutes Intravenous Every 24 hours 02/16/18 0513 02/16/18 1031   02/15/18 2000  ceFEPIme (MAXIPIME) 1 g in sodium chloride 0.9 % 100 mL IVPB  Status:  Discontinued     1 g 200 mL/hr over 30 Minutes Intravenous Every 24 hours 02/15/18 0023 02/16/18 0513   02/15/18 0023  vancomycin variable dose per unstable renal function (pharmacist dosing)  Status:   Discontinued      Does not apply See admin instructions 02/15/18 0023 02/16/18 0514   02/14/18 2145  ceFEPIme (MAXIPIME) 1 g in sodium chloride 0.9 % 100 mL IVPB     1 g 200 mL/hr over 30 Minutes Intravenous  Once 02/14/18 2130 02/14/18 2241   02/14/18 2145  vancomycin (VANCOCIN) 2,000 mg in sodium chloride 0.9 % 500 mL IVPB     2,000 mg 250 mL/hr over 120 Minutes Intravenous STAT 02/14/18 2131 02/15/18 0115         Subjective: Denies any new complaints  Objective: Vitals:   02/21/18 2110 02/22/18 0456 02/22/18 0916 02/22/18 1240  BP: 112/87 139/89 (!) 123/97 125/79  Pulse: (!) 51 68 (!) 118 85  Resp: 20 20  18   Temp: 98 F (36.7 C) 97.8 F (36.6 C)  98 F (36.7 C)  TempSrc: Oral Oral  Oral  SpO2: 100% 94% 95% 95%  Weight:      Height:        Intake/Output Summary (Last 24 hours) at 02/22/2018 1723 Last data filed at 02/22/2018 1223 Gross per 24 hour  Intake 340 ml  Output 2200 ml  Net -1860 ml   Filed Weights   02/17/18 1100  Weight: 99.6 kg    Examination:  General: NAD   Cardiovascular: S1, S2 present  Respiratory: CTAB, diminished BS b/l  Abdomen: Soft, nontender, nondistended, bowel sounds  present  Musculoskeletal: No bilateral pedal edema noted  Skin: Normal  Psychiatry: Normal mood    Data Reviewed: I have personally reviewed following labs and imaging studies  CBC: Recent Labs  Lab 02/17/18 0539 02/18/18 0538 02/20/18 0527 02/21/18 0546 02/22/18 0641  WBC 18.8* 15.5* 20.4* 17.8* 19.6*  NEUTROABS  --   --   --   --  15.7*  HGB 12.5* 13.4 13.7 13.0 13.8  HCT 39.7 42.7 43.6 41.2 43.0  MCV 99.5 98.2 98.0 100.0 97.5  PLT 402* 459* 516* 453* 938*   Basic Metabolic Panel: Recent Labs  Lab 02/17/18 0539 02/18/18 0538 02/20/18 0527 02/21/18 0546 02/22/18 0641  NA 141 142 142 141 138  K 3.6 3.7 3.7 3.9 3.8  CL 107 107 107 107 101  CO2 25 25 25 27 26   GLUCOSE 114* 156* 140* 146* 177*  BUN 40* 30* 23 22 24*  CREATININE 1.24 1.04  0.97 0.98 1.14  CALCIUM 7.7* 8.0* 8.0* 7.8* 8.0*  MG  --   --   --  1.4* 1.5*   GFR: Estimated Creatinine Clearance: 58.7 mL/min (by C-G formula based on SCr of 1.14 mg/dL). Liver Function Tests: Recent Labs  Lab 02/21/18 0546  AST 19  ALT 23  ALKPHOS 112  BILITOT 1.6*  PROT 4.9*  ALBUMIN 2.4*   No results for input(s): LIPASE, AMYLASE in the last 168 hours. No results for input(s): AMMONIA in the last 168 hours. Coagulation Profile: Recent Labs  Lab 02/18/18 0538 02/19/18 0535 02/20/18 0527 02/21/18 0546 02/22/18 0641  INR 2.43 1.64 1.75 1.71 1.60   Cardiac Enzymes: No results for input(s): CKTOTAL, CKMB, CKMBINDEX, TROPONINI in the last 168 hours. BNP (last 3 results) No results for input(s): PROBNP in the last 8760 hours. HbA1C: No results for input(s): HGBA1C in the last 72 hours. CBG: Recent Labs  Lab 02/20/18 2115 02/21/18 0725 02/21/18 1131 02/21/18 1721 02/22/18 0757  GLUCAP 126* 127* 160* 93 227*   Lipid Profile: No results for input(s): CHOL, HDL, LDLCALC, TRIG, CHOLHDL, LDLDIRECT in the last 72 hours. Thyroid Function Tests: No results for input(s): TSH, T4TOTAL, FREET4, T3FREE, THYROIDAB in the last 72 hours. Anemia Panel: No results for input(s): VITAMINB12, FOLATE, FERRITIN, TIBC, IRON, RETICCTPCT in the last 72 hours. Sepsis Labs: Recent Labs  Lab 02/21/18 0546  PROCALCITON <0.10    Recent Results (from the past 240 hour(s))  Urine culture     Status: None   Collection Time: 02/14/18 10:11 PM  Result Value Ref Range Status   Specimen Description   Final    URINE, CATHETERIZED Performed at [facility] [address], [place], [place] [address]    Special Requests   Final    NONE Performed at [facility], [place] [address], [place], [place] [address]    Culture   Final    NO GROWTH Performed at [facility], [place] [address] [place],  [address]    Report Status 02/16/2018 FINAL   Final  MRSA PCR Screening     Status: None   Collection Time: 02/15/18  2:25 AM  Result Value Ref Range Status   MRSA by PCR NEGATIVE NEGATIVE Final    Comment:        The GeneXpert MRSA Assay (FDA approved for NASAL specimens only), is one component of a comprehensive MRSA colonization surveillance program. It is not intended to diagnose MRSA infection nor to guide or monitor treatment for MRSA infections. Performed  at [facility], [place] [address], [place], [place] [address]   Culture, blood (routine x 2)     Status: None   Collection Time: 02/15/18  2:29 AM  Result Value Ref Range Status   Specimen Description   Final    BLOOD RIGHT HAND Performed at [facility] [address] [place], [place] [address]    Special Requests   Final    BOTTLES DRAWN AEROBIC AND ANAEROBIC Blood Culture adequate volume Performed at [facility] [address] [place], [place] [address]    Culture   Final    NO GROWTH 5 DAYS Performed at [facility], [place] [address] [place], [place] [address]    Report Status 02/20/2018 FINAL  Final  Culture, blood (routine x 2)     Status: None   Collection Time: 02/15/18  2:29 AM  Result Value Ref Range Status   Specimen Description   Final    BLOOD LEFT HAND Performed at [facility] [address], [place], [place] [address]    Special Requests   Final    BOTTLES DRAWN AEROBIC ONLY Blood Culture adequate volume Performed at [facility] [address] [place], [place] [address]    Culture   Final    NO GROWTH 5 DAYS Performed at [facility], [place] [address], [place], [place] [address]    Report Status 02/20/2018 FINAL  Final  Respiratory Panel by PCR     Status: None   Collection Time: 02/15/18  8:30 AM  Result Value Ref Range Status   Adenovirus NOT DETECTED NOT DETECTED Final   Coronavirus 229E NOT DETECTED NOT DETECTED Final    Coronavirus HKU1 NOT DETECTED NOT DETECTED Final   Coronavirus NL63 NOT DETECTED NOT DETECTED Final   Coronavirus OC43 NOT DETECTED NOT DETECTED Final   Metapneumovirus NOT DETECTED NOT DETECTED Final   Rhinovirus / Enterovirus NOT DETECTED NOT DETECTED Final   Influenza A NOT DETECTED NOT DETECTED Final   Influenza B NOT DETECTED NOT DETECTED Final   Parainfluenza Virus 1 NOT DETECTED NOT DETECTED Final   Parainfluenza Virus 2 NOT DETECTED NOT DETECTED Final   Parainfluenza Virus 3 NOT DETECTED NOT DETECTED Final   Parainfluenza Virus 4 NOT DETECTED NOT DETECTED Final   Respiratory Syncytial Virus NOT DETECTED NOT DETECTED Final   Bordetella pertussis NOT DETECTED NOT DETECTED Final   Chlamydophila pneumoniae NOT DETECTED NOT DETECTED Final   Mycoplasma pneumoniae NOT DETECTED NOT DETECTED Final    Comment: Performed at [facility], [place] [address], [place], [place] [address]         Radiology Studies: Dg Chest 2 View  Result Date: 02/21/2018 CLINICAL DATA:  Hypoxia. EXAM: CHEST - 2 VIEW COMPARISON:  02/18/2018. FINDINGS: Cardiomegaly. Diffuse bilateral pulmonary interstitial prominence and bilateral pleural effusions. These findings are consistent with congestive heart failure. Stable left apical pneumothorax. IMPRESSION: 1. Cardiomegaly. Diffuse bilateral from interstitial prominence and bilateral pleural effusions. Findings consistent with CHF. 2.  Stable known left apical pneumothorax. Electronically Signed   By: [place]   On: 02/21/2018 13:34        Scheduled Meds: . diltiazem  300 mg Oral Daily  . furosemide  40 mg Oral Daily  . insulin aspart  0-9 Units Subcutaneous TID WC  . ipratropium-albuterol  3 mL Nebulization TID  . levothyroxine  112 mcg Oral Q0600  . mouth rinse  15 mL  Mouth Rinse BID  . metoprolol tartrate  12.5 mg Oral BID  . mometasone-formoterol  2 puff Inhalation BID  . warfarin  7.5 mg Oral ONCE-1800  . Warfarin - Pharmacist Dosing  Inpatient   Does not apply q1800   Continuous Infusions:    LOS: 8 days       Alma Friendly, MD [facility]   If 7PM-7AM, please contact night-coverage 02/22/2018, 5:23 PM

## 2018-02-23 LAB — BASIC METABOLIC PANEL
Anion gap: 10 (ref 5–15)
BUN: 20 mg/dL (ref 8–23)
CHLORIDE: 104 mmol/L (ref 98–111)
CO2: 25 mmol/L (ref 22–32)
Calcium: 8.2 mg/dL — ABNORMAL LOW (ref 8.9–10.3)
Creatinine, Ser: 0.99 mg/dL (ref 0.61–1.24)
GFR calc Af Amer: >60 mL/min (ref >60)
GFR calc non Af Amer: >60 mL/min (ref >60)
Glucose, Bld: 117 mg/dL — ABNORMAL HIGH (ref 70–99)
Potassium: 3.9 mmol/L (ref 3.5–5.1)
Sodium: 139 mmol/L (ref 135–145)

## 2018-02-23 LAB — PROTIME-INR
INR: 1.67
Prothrombin Time: 19.5 seconds — ABNORMAL HIGH (ref 11.4–15.2)

## 2018-02-23 LAB — GLUCOSE, CAPILLARY
GLUCOSE-CAPILLARY: 229 mg/dL — AB (ref 70–99)
Glucose-Capillary: 108 mg/dL — ABNORMAL HIGH (ref 70–99)
Glucose-Capillary: 120 mg/dL — ABNORMAL HIGH (ref 70–99)
Glucose-Capillary: 129 mg/dL — ABNORMAL HIGH (ref 70–99)
Glucose-Capillary: 303 mg/dL — ABNORMAL HIGH (ref 70–99)

## 2018-02-23 LAB — MAGNESIUM: Magnesium: 1.8 mg/dL (ref 1.7–2.4)

## 2018-02-23 MED ORDER — FUROSEMIDE 10 MG/ML IJ SOLN
40.0000 mg | Freq: Once | INTRAMUSCULAR | Status: AC
  Administered 2018-02-23: 40 mg via INTRAVENOUS
  Filled 2018-02-23: qty 4

## 2018-02-23 MED ORDER — BUDESONIDE 0.25 MG/2ML IN SUSP
0.2500 mg | Freq: Two times a day (BID) | RESPIRATORY_TRACT | Status: DC
  Administered 2018-02-23 – 2018-02-25 (×4): 0.25 mg via RESPIRATORY_TRACT
  Filled 2018-02-23 (×4): qty 2

## 2018-02-23 MED ORDER — WARFARIN SODIUM 5 MG PO TABS
7.5000 mg | ORAL_TABLET | Freq: Once | ORAL | Status: AC
  Administered 2018-02-23: 7.5 mg via ORAL
  Filled 2018-02-23: qty 1

## 2018-02-23 MED ORDER — PREDNISONE 20 MG PO TABS
40.0000 mg | ORAL_TABLET | Freq: Every day | ORAL | Status: DC
  Administered 2018-02-23 – 2018-02-25 (×3): 40 mg via ORAL
  Filled 2018-02-23 (×3): qty 2

## 2018-02-23 NOTE — Progress Notes (Signed)
ANTICOAGULATION CONSULT NOTE - Follow-Up Consult  Pharmacy Consult for warfarin Indication: atrial fibrillation  No Known Allergies  Patient Measurements: Height: 6\' 3"  (190.5 cm) Weight: 219 lb 9.3 oz (99.6 kg) IBW/kg (Calculated) : 84.5   Vital Signs: Temp: 98.5 F (36.9 C) (12/07 1304) Temp Source: Oral (12/07 1304) BP: 109/79 (12/07 1304) Pulse Rate: 95 (12/07 1304)  Labs: Recent Labs    02/21/18 0546 02/22/18 0641 02/23/18 0544  HGB 13.0 13.8  --   HCT 41.2 43.0  --   PLT 453* 487*  --   LABPROT 19.9* 18.9* 19.5*  INR 1.71 1.60 1.67  CREATININE 0.98 1.14 0.99    Estimated Creatinine Clearance: 67.6 mL/min (by C-G formula based on SCr of 0.99 mg/dL).   Assessment: Pharmacy is consulted to dose warfarin in 84 yo male with PMH of a.fib. Pt was admitted with elevated INR, but INR was allowed to trend down to therapeutic range. Home dose 5 mg qday except 7.5 mg TThSat  Today, 02/23/18   INR remains subtherapeutic but possibly trending back up today  No bleeding issues noted per chart review  CBC stable wnl as of 12/6  Eating 25% of meals  Drug-drug interactions: none major; glucocorticoids may increase warfarin sensitivity  Goal of Therapy:  INR 2-3 Monitor platelets by anticoagulation protocol: Yes   Plan:   Repeat warfarin 7.5 mg PO x 1 tonight  Daily INR   Monitor for signs and symptoms of bleeding  Reuel Boom, PharmD, BCPS [phone] 02/23/2018, 1:19 PM

## 2018-02-23 NOTE — Progress Notes (Signed)
PROGRESS NOTE    Justin Garner  UYQ:[id number] DOB: 08/09/1932 DOA: 02/14/2018 PCP: [name], MD   Brief Narrative:  84 y.o.malewith medical history significant ofCAD, CKD stage III, COPD, chronic systolic CHF, type 2 diabetes, GERD, hypertension, hyperlipidemia, paroxysmal A. fib on Coumadin, history of lung cancer status post right upper lobe lobectomy in 2009 prior PE presenting to the hospital for evaluation of shortness of breath and blood in Foley catheter. Patient was recently admitted from February 04, 2018 to February 11, 2018 for shortness of breath, confusion, and rhinovirus infection. He was discharged home on 2 L oxygen via nasal cannula.   He was found to require higher doses of oxygen.  Was noted to have urinary retention requiring a change in Foley catheter.  Chest x-ray showed small pneumothorax.  CT scan showed the same and also showed evidence for pneumonia.  Patient was hospitalized and started on broad-spectrum antibiotics.     Assessment & Plan:   Principal Problem:   Acute respiratory failure with hypoxia (HCC) Active Problems:   Pneumothorax   Essential hypertension, benign   UTI (urinary tract infection)   Chronic systolic CHF (congestive heart failure) (HCC)   Hypothyroidism   HLD (hyperlipidemia)   CAD in native artery   Supratherapeutic INR   COPD (chronic obstructive pulmonary disease) (HCC)   Atrial fibrillation with RVR (HCC)   Acute metabolic encephalopathy   AKI (acute kidney injury) ([place])   HCAP (healthcare-associated pneumonia)   CKD (chronic kidney disease) stage 3, GFR 30-59 ml/min (HCC)   QT prolongation   Type 2 diabetes mellitus ([place])   Dementia ([place])   Physical deconditioning   Healthcare associated pneumonia with acute hypoxic respiratory failure/pneumothorax/sepsis, present on admission Patient seen by pulmonology for pneumonthorax Did not require any intervention for the pneumothorax (last note from 12/2 who note if pt  requires chest tube, he'll require pleurodesis).  Suspicion is that one of blebs ruptured. CXR from 12/2 with improved "tiny" L apical pneumonthorax Seen by speech on 11/30, no over s/s of aspiration or penetration, recommended dysphagia 3 diet with thin liquids Negative MRSA PCR Negative blood cx x 5 days Vancomycin d/c'd on 11/30 Will d/c Cefepime 11/28 - 12/5, completed 8 days with repeat procalcitonin <0.10 Repeat CXR for 12/5 showed pulm vascular congestion, with stable pneumothorax Continue PO lasix  Leukocytosis Afebrile, with leukocytosis which appears chronic, ongoing for years Procalcitonin negative Will need outpt follow up with hematology  Chronic atrial fibrillation with RVR Patient with known history of atrial fibrillation.   Continue metoprolol and diltiazem  Echo 09/2016 with EF 35-40% TSH 4.5 on 11/19 with normal free T4 Chadsvasc 6  Continue warfarin per pharmacy   Hypomagnesemia  Replace prn  Acute kidney injury on chronic kidney disease stage III with hematuria/acute urinary retention Resolved Patient had a indwelling Foley catheter which was placed during his previous hospitalization.   Apparently the catheter was malpositioned resulting in retention, which was thought to be reason for his AKI Catheter was replaced in the emergency department.  Seen by Dr. Diona Fanti, without any recommendation for intervention Renal US without any hydro (but some mild-mod bladder distention (11/29) Needs outpatient urology follow up  Abnormal UA  Culture without any growth.  Abnormal UA was most likely due to hematuria.  No evidence for UTI.   Follow up with urology as outpatient  QT prolongation on EKG Caution with QT prolonging meds Follow repeat EKG as outpatient Follow mag/k  Acute metabolic encephalopathy A&Ox2, likely some  delirium in setting of acute hospitalization Delirium precautuins  History of COPD Continue duoneb, started prednisone and pulmicort  nebs  Chronic systolic CHF Ejection fraction noted to be 35 to 40% based on echo from June 2018 Restart lasix as recent CXR showed pulm vascular congestion Gave 1 dose of IV lasix 02/23/18  History of coronary artery disease Stable.  Continue current medications.  Essential hypertension Blood pressure soft but stable.  Type 2 diabetes mellitus, controlled HbA1c 6.0 on 02/07/2018.  Continue SSI.  Continue to monitor CBGs  History of dementia with history of behavioral disturbances Stable currently.  Patient was started on Zyprexa during his previous hospitalization.  Currently on hold due to prolonged QT.  History of hypothyroidism Synthroid  Physical deconditioning PT and OT evaluation. Will need to go to skilled nursing facility.  DVT prophylaxis:warfarin Code Status: DNR Family Communication: None at bedside Disposition Plan: SNF   Consultants:   Pulm  Palliative care  Urology  Procedures:   Replacement of foley catheter  Antimicrobials:  Anti-infectives (From admission, onward)   Start     Dose/Rate Route Frequency Ordered Stop   02/17/18 1800  ceFEPIme (MAXIPIME) 1 g in sodium chloride 0.9 % 100 mL IVPB  Status:  Discontinued     1 g 200 mL/hr over 30 Minutes Intravenous Every 8 hours 02/17/18 1052 02/21/18 1718   02/16/18 0800  ceFEPIme (MAXIPIME) 1 g in sodium chloride 0.9 % 100 mL IVPB  Status:  Discontinued     1 g 200 mL/hr over 30 Minutes Intravenous Every 12 hours 02/16/18 0513 02/17/18 1052   02/16/18 0600  vancomycin (VANCOCIN) IVPB 1000 mg/200 mL premix  Status:  Discontinued     1,000 mg 200 mL/hr over 60 Minutes Intravenous Every 24 hours 02/16/18 0513 02/16/18 1031   02/15/18 2000  ceFEPIme (MAXIPIME) 1 g in sodium chloride 0.9 % 100 mL IVPB  Status:  Discontinued     1 g 200 mL/hr over 30 Minutes Intravenous Every 24 hours 02/15/18 0023 02/16/18 0513   02/15/18 0023  vancomycin variable dose per unstable renal function (pharmacist  dosing)  Status:  Discontinued      Does not apply See admin instructions 02/15/18 0023 02/16/18 0514   02/14/18 2145  ceFEPIme (MAXIPIME) 1 g in sodium chloride 0.9 % 100 mL IVPB     1 g 200 mL/hr over 30 Minutes Intravenous  Once 02/14/18 2130 02/14/18 2241   02/14/18 2145  vancomycin (VANCOCIN) 2,000 mg in sodium chloride 0.9 % 500 mL IVPB     2,000 mg 250 mL/hr over 120 Minutes Intravenous STAT 02/14/18 2131 02/15/18 0115         Subjective: Denies any new complaints. Noted to have worsening congestion with some gurgling breathing  Objective: Vitals:   02/22/18 2159 02/23/18 0522 02/23/18 1304 02/23/18 1443  BP: 104/78 114/75 109/79   Pulse: (!) 39 80 95   Resp: (!) 26 (!) 28 (!) 24   Temp: 98.7 F (37.1 C) 98.5 F (36.9 C) 98.5 F (36.9 C)   TempSrc: Oral Oral Oral   SpO2: 92% 96% (!) 89% 90%  Weight:      Height:        Intake/Output Summary (Last 24 hours) at 02/23/2018 1729 Last data filed at 02/23/2018 1703 Gross per 24 hour  Intake -  Output 975 ml  Net -975 ml   Filed Weights   02/17/18 1100  Weight: 99.6 kg    Examination:  General: NAD  Cardiovascular: S1, S2 present  Respiratory: Coarse BS bilaterally  Abdomen: Soft, nontender, nondistended, bowel sounds present  Musculoskeletal: No bilateral pedal edema noted  Skin: Normal  Psychiatry: Normal mood    Data Reviewed: I have personally reviewed following labs and imaging studies  CBC: Recent Labs  Lab 02/17/18 0539 02/18/18 0538 02/20/18 0527 02/21/18 0546 02/22/18 0641  WBC 18.8* 15.5* 20.4* 17.8* 19.6*  NEUTROABS  --   --   --   --  15.7*  HGB 12.5* 13.4 13.7 13.0 13.8  HCT 39.7 42.7 43.6 41.2 43.0  MCV 99.5 98.2 98.0 100.0 97.5  PLT 402* 459* 516* 453* 416*   Basic Metabolic Panel: Recent Labs  Lab 02/18/18 0538 02/20/18 0527 02/21/18 0546 02/22/18 0641 02/23/18 0544  NA 142 142 141 138 139  K 3.7 3.7 3.9 3.8 3.9  CL 107 107 107 101 104  CO2 25 25 27 26 25     GLUCOSE 156* 140* 146* 177* 117*  BUN 30* 23 22 24* 20  CREATININE 1.04 0.97 0.98 1.14 0.99  CALCIUM 8.0* 8.0* 7.8* 8.0* 8.2*  MG  --   --  1.4* 1.5* 1.8   GFR: Estimated Creatinine Clearance: 67.6 mL/min (by C-G formula based on SCr of 0.99 mg/dL). Liver Function Tests: Recent Labs  Lab 02/21/18 0546  AST 19  ALT 23  ALKPHOS 112  BILITOT 1.6*  PROT 4.9*  ALBUMIN 2.4*   No results for input(s): LIPASE, AMYLASE in the last 168 hours. No results for input(s): AMMONIA in the last 168 hours. Coagulation Profile: Recent Labs  Lab 02/19/18 0535 02/20/18 0527 02/21/18 0546 02/22/18 0641 02/23/18 0544  INR 1.64 1.75 1.71 1.60 1.67   Cardiac Enzymes: No results for input(s): CKTOTAL, CKMB, CKMBINDEX, TROPONINI in the last 168 hours. BNP (last 3 results) No results for input(s): PROBNP in the last 8760 hours. HbA1C: No results for input(s): HGBA1C in the last 72 hours. CBG: Recent Labs  Lab 02/22/18 1652 02/23/18 0040 02/23/18 0810 02/23/18 1155 02/23/18 1629  GLUCAP 152* 129* 108* 229* 120*   Lipid Profile: No results for input(s): CHOL, HDL, LDLCALC, TRIG, CHOLHDL, LDLDIRECT in the last 72 hours. Thyroid Function Tests: No results for input(s): TSH, T4TOTAL, FREET4, T3FREE, THYROIDAB in the last 72 hours. Anemia Panel: No results for input(s): VITAMINB12, FOLATE, FERRITIN, TIBC, IRON, RETICCTPCT in the last 72 hours. Sepsis Labs: Recent Labs  Lab 02/21/18 0546  PROCALCITON <0.10    Recent Results (from the past 240 hour(s))  Urine culture     Status: None   Collection Time: 02/14/18 10:11 PM  Result Value Ref Range Status   Specimen Description   Final    URINE, CATHETERIZED Performed at [facility] [address] [place], [place] [address]    Special Requests   Final    NONE Performed at [facility], [place] [address], [place], [place] [address]    Culture   Final    NO GROWTH Performed at [place], [place] [address] [place], [place] [address]    Report Status 02/16/2018 FINAL  Final  MRSA PCR Screening     Status: None   Collection Time: 02/15/18  2:25 AM  Result Value Ref Range Status   MRSA by PCR NEGATIVE NEGATIVE Final    Comment:        The GeneXpert MRSA Assay (FDA approved for NASAL specimens only), is one component of a comprehensive MRSA colonization surveillance program. It is not  intended to diagnose MRSA infection nor to guide or monitor treatment for MRSA infections. Performed at [facility], [place] [address], [place], [place] [address]   Culture, blood (routine x 2)     Status: None   Collection Time: 02/15/18  2:29 AM  Result Value Ref Range Status   Specimen Description   Final    BLOOD RIGHT HAND Performed at [facility] [address] [place], [place] [address]    Special Requests   Final    BOTTLES DRAWN AEROBIC AND ANAEROBIC Blood Culture adequate volume Performed at [facility] [address] [place], [place] [address]    Culture   Final    NO GROWTH 5 DAYS Performed at [facility], [place] [address] [place], [place] [address]    Report Status 02/20/2018 FINAL  Final  Culture, blood (routine x 2)     Status: None   Collection Time: 02/15/18  2:29 AM  Result Value Ref Range Status   Specimen Description   Final    BLOOD LEFT HAND Performed at [facility] [address] [place], [place] [address]    Special Requests   Final    BOTTLES DRAWN AEROBIC ONLY Blood Culture adequate volume Performed at [facility] [address], [place], [place] [address]    Culture   Final    NO GROWTH 5 DAYS Performed at [facility], [place] [address], [place], [place] [address]    Report Status 02/20/2018 FINAL  Final  Respiratory Panel by PCR     Status: None   Collection Time: 02/15/18  8:30 AM  Result Value Ref Range Status   Adenovirus NOT  DETECTED NOT DETECTED Final   Coronavirus 229E NOT DETECTED NOT DETECTED Final   Coronavirus HKU1 NOT DETECTED NOT DETECTED Final   Coronavirus NL63 NOT DETECTED NOT DETECTED Final   Coronavirus OC43 NOT DETECTED NOT DETECTED Final   Metapneumovirus NOT DETECTED NOT DETECTED Final   Rhinovirus / Enterovirus NOT DETECTED NOT DETECTED Final   Influenza A NOT DETECTED NOT DETECTED Final   Influenza B NOT DETECTED NOT DETECTED Final   Parainfluenza Virus 1 NOT DETECTED NOT DETECTED Final   Parainfluenza Virus 2 NOT DETECTED NOT DETECTED Final   Parainfluenza Virus 3 NOT DETECTED NOT DETECTED Final   Parainfluenza Virus 4 NOT DETECTED NOT DETECTED Final   Respiratory Syncytial Virus NOT DETECTED NOT DETECTED Final   Bordetella pertussis NOT DETECTED NOT DETECTED Final   Chlamydophila pneumoniae NOT DETECTED NOT DETECTED Final   Mycoplasma pneumoniae NOT DETECTED NOT DETECTED Final    Comment: Performed at [facility], [place] [address], [place], [place] [address]         Radiology Studies: No results found.      Scheduled Meds: . budesonide (PULMICORT) nebulizer solution  0.25 mg Nebulization BID  . diltiazem  300 mg Oral Daily  . furosemide  40 mg Oral Daily  . insulin aspart  0-9 Units Subcutaneous TID WC  . ipratropium-albuterol  3 mL Nebulization TID  . levothyroxine  112 mcg Oral Q0600  . mouth rinse  15 mL Mouth Rinse BID  . metoprolol tartrate  12.5 mg Oral BID  . mometasone-formoterol  2 puff Inhalation BID  . predniSONE  40 mg Oral Q breakfast  . warfarin  7.5 mg Oral ONCE-1800  . Warfarin - Pharmacist Dosing Inpatient   Does not apply [phone]  Continuous Infusions:    LOS: 9 days       Alma Friendly, MD [facility]   If 7PM-7AM, please contact night-coverage 02/23/2018, 5:29 PM

## 2018-02-23 NOTE — Plan of Care (Signed)
Pt was calm and cooperative this shift. He did not attempt to exit the bed.

## 2018-02-24 DIAGNOSIS — F0390 Unspecified dementia without behavioral disturbance: Secondary | ICD-10-CM

## 2018-02-24 LAB — GLUCOSE, CAPILLARY
GLUCOSE-CAPILLARY: 306 mg/dL — AB (ref 70–99)
Glucose-Capillary: 159 mg/dL — ABNORMAL HIGH (ref 70–99)
Glucose-Capillary: 276 mg/dL — ABNORMAL HIGH (ref 70–99)
Glucose-Capillary: 335 mg/dL — ABNORMAL HIGH (ref 70–99)

## 2018-02-24 LAB — BASIC METABOLIC PANEL
Anion gap: 13 (ref 5–15)
BUN: 28 mg/dL — ABNORMAL HIGH (ref 8–23)
CALCIUM: 8.2 mg/dL — AB (ref 8.9–10.3)
CO2: 25 mmol/L (ref 22–32)
Chloride: 101 mmol/L (ref 98–111)
Creatinine, Ser: 1.12 mg/dL (ref 0.61–1.24)
GFR calc Af Amer: >60 mL/min (ref >60)
GFR calc non Af Amer: >60 mL/min (ref >60)
Glucose, Bld: 190 mg/dL — ABNORMAL HIGH (ref 70–99)
Potassium: 3.9 mmol/L (ref 3.5–5.1)
Sodium: 139 mmol/L (ref 135–145)

## 2018-02-24 LAB — PROTIME-INR
INR: 1.87
PROTHROMBIN TIME: 21.3 s — AB (ref 11.4–15.2)

## 2018-02-24 MED ORDER — WARFARIN SODIUM 5 MG PO TABS
5.0000 mg | ORAL_TABLET | Freq: Once | ORAL | Status: AC
  Administered 2018-02-24: 5 mg via ORAL
  Filled 2018-02-24: qty 1

## 2018-02-24 MED ORDER — INSULIN ASPART 100 UNIT/ML ~~LOC~~ SOLN
7.0000 [IU] | Freq: Once | SUBCUTANEOUS | Status: AC
  Administered 2018-02-24: 7 [IU] via SUBCUTANEOUS

## 2018-02-24 NOTE — Progress Notes (Signed)
Palliative Care Progress Note  Reason for consult: Goals of care  I met today with Mr. Dault.  He was more awake and alert and asked for Dr. Pepper.  His mental status was the best I have seen this week, but I still do not think that he has understanding of his condition.  I called and was able to reach his granddaughter.  We discussed clinical course over the past few days and his granddaughter reports that family has been trying to process the fact that he may not improve.  She reports that they brought him some outside food last evening and he enjoyed it greatly.  Her boyfriend then brought him breakfast and he finished it as well.  We discussed his multiple comorbid conditions and the fact that he has continued to decline in his nutrition, cognition, and functional status.  Discussed that even if he improves short term this hospitalization, his overall trajectory continues to decline.  She expressed understanding this.  - DNR/DNI - Continue current care.  Family remains hopeful for some level of stabilization and return of functional status, but his granddaughter is able to verbalize this evening that this may not be the case. -Palliative to continue to follow while inpatient.  If he reaches a point where he is stable enough for discharge, I would recommend palliative care to follow at skilled facility.  Please include in the discharge summary if you agree is appropriate.  Total time: 40 minutes Greater than 50%  of this time was spent counseling and coordinating care related to the above assessment and plan.   , MD Time Palliative Medicine Team [phone]  

## 2018-02-24 NOTE — Progress Notes (Signed)
Patient refusing to turn and position.  Educated and encouraged but wants to stay in same position.

## 2018-02-24 NOTE — Progress Notes (Addendum)
ANTICOAGULATION CONSULT NOTE  Pharmacy Consult for warfarin Indication: atrial fibrillation  No Known Allergies  Patient Measurements: Height: 6\' 3"  (190.5 cm) Weight: 219 lb 9.3 oz (99.6 kg) IBW/kg (Calculated) : 84.5   Vital Signs: Temp: 97.6 F (36.4 C) (12/08 0628) Temp Source: Oral (12/08 0628) BP: 107/77 (12/08 0628) Pulse Rate: 61 (12/08 0628)  Labs: Recent Labs    02/22/18 0641 02/23/18 0544 02/24/18 0557  HGB 13.8  --   --   HCT 43.0  --   --   PLT 487*  --   --   LABPROT 18.9* 19.5* 21.3*  INR 1.60 1.67 1.87  CREATININE 1.14 0.99 1.12    Estimated Creatinine Clearance: 59.7 mL/min (by C-G formula based on SCr of 1.12 mg/dL).   Assessment: Pharmacy is consulted to dose warfarin in 84 yo male with PMH of a.fib. Pt was admitted with elevated INR, but INR was allowed to trend down to therapeutic range. Home dose 5 mg qday except 7.5 mg TThSat  Today, 02/24/18   INR remains subtherapeutic but trending back up nicely  No bleeding issues noted per chart review  CBC stable wnl as of 12/6  Eating 100% of meals  Drug-drug interactions: none major; glucocorticoids may increase warfarin sensitivity  Goal of Therapy:  INR 2-3 Monitor platelets by anticoagulation protocol: Yes   Plan:   Warfarin 5 mg PO x 1 tonight - suspect patient will be close to therapeutic tomorrow, and given high INR on admission with PTA regimen, acute illness, and steroids, want to avoid overshooting  Daily INR, CBC tomorrow  Monitor for signs and symptoms of bleeding  Reuel Boom, PharmD, BCPS [phone] 02/24/2018, 10:38 AM

## 2018-02-24 NOTE — Plan of Care (Signed)
Family brought food in for pt and he consumed 100%.

## 2018-02-24 NOTE — Progress Notes (Signed)
PROGRESS NOTE    Justin Garner  YQM:[id number] DOB: 1932-04-03 DOA: 02/14/2018 PCP: [name], MD   Brief Narrative:  84 y.o.malewith medical history significant ofCAD, CKD stage III, COPD, chronic systolic CHF, type 2 diabetes, GERD, hypertension, hyperlipidemia, paroxysmal A. fib on Coumadin, history of lung cancer status post right upper lobe lobectomy in 2009 prior PE presenting to the hospital for evaluation of shortness of breath and blood in Foley catheter. Patient was recently admitted from February 04, 2018 to February 11, 2018 for shortness of breath, confusion, and rhinovirus infection. He was discharged home on 2 L oxygen via nasal cannula.   He was found to require higher doses of oxygen.  Was noted to have urinary retention requiring a change in Foley catheter.  Chest x-ray showed small pneumothorax.  CT scan showed the same and also showed evidence for pneumonia.  Patient was hospitalized and started on broad-spectrum antibiotics.     Assessment & Plan:   Principal Problem:   Acute respiratory failure with hypoxia (HCC) Active Problems:   Pneumothorax   Essential hypertension, benign   UTI (urinary tract infection)   Chronic systolic CHF (congestive heart failure) (HCC)   Hypothyroidism   HLD (hyperlipidemia)   CAD in native artery   Supratherapeutic INR   COPD (chronic obstructive pulmonary disease) (HCC)   Atrial fibrillation with RVR (HCC)   Acute metabolic encephalopathy   AKI (acute kidney injury) ([place])   HCAP (healthcare-associated pneumonia)   CKD (chronic kidney disease) stage 3, GFR 30-59 ml/min (HCC)   QT prolongation   Type 2 diabetes mellitus ([place])   Dementia ([place])   Physical deconditioning   Healthcare associated pneumonia with acute hypoxic respiratory failure/pneumothorax/sepsis, present on admission Patient seen by pulmonology for pneumonthorax Did not require any intervention for the pneumothorax (last note from 12/2 who note if pt  requires chest tube, he'll require pleurodesis).  Suspicion is that one of blebs ruptured. CXR from 12/2 with improved "tiny" L apical pneumonthorax Seen by speech on 11/30, no over s/s of aspiration or penetration, recommended dysphagia 3 diet with thin liquids Negative MRSA PCR Negative blood cx x 5 days Vancomycin d/c'd on 11/30 Will d/c Cefepime 11/28 - 12/5, completed 8 days with repeat procalcitonin <0.10 Repeat CXR for 12/5 showed pulm vascular congestion, with stable pneumothorax Continue PO lasix  Leukocytosis Afebrile, with leukocytosis which appears chronic, ongoing for years Procalcitonin negative Will need outpt follow up with hematology  Chronic atrial fibrillation with RVR Patient with known history of atrial fibrillation.   Continue metoprolol and diltiazem  Echo 09/2016 with EF 35-40% TSH 4.5 on 11/19 with normal free T4 Chadsvasc 6  Continue warfarin per pharmacy   Hypomagnesemia  Replace prn  Acute kidney injury on chronic kidney disease stage III with hematuria/acute urinary retention Resolved Patient had a indwelling Foley catheter which was placed during his previous hospitalization.   Apparently the catheter was malpositioned resulting in retention, which was thought to be reason for his AKI Catheter was replaced in the emergency department.  Seen by Dr. Diona Fanti, without any recommendation for intervention Renal US without any hydro (but some mild-mod bladder distention (11/29) Needs outpatient urology follow up  Abnormal UA  Culture without any growth.  Abnormal UA was most likely due to hematuria.  No evidence for UTI.   Follow up with urology as outpatient  QT prolongation on EKG Caution with QT prolonging meds Follow repeat EKG as outpatient Follow mag/k  Acute metabolic encephalopathy A&Ox2, likely some  delirium in setting of acute hospitalization Delirium precautuins  History of COPD Continue duoneb, started prednisone and pulmicort  nebs  Chronic systolic CHF Ejection fraction noted to be 35 to 40% based on echo from June 2018 Restart lasix as recent CXR showed pulm vascular congestion Gave 1 dose of IV lasix 02/23/18  History of coronary artery disease Stable.  Continue current medications.  Essential hypertension Blood pressure soft but stable.  Type 2 diabetes mellitus, controlled HbA1c 6.0 on 02/07/2018.  Continue SSI.  Continue to monitor CBGs  History of dementia with history of behavioral disturbances Stable currently.  Patient was started on Zyprexa during his previous hospitalization.  Currently on hold due to prolonged QT.  History of hypothyroidism Synthroid  Physical deconditioning PT and OT evaluation. Will need to go to skilled nursing facility.  DVT prophylaxis:warfarin Code Status: DNR Family Communication: None at bedside Disposition Plan: SNF   Consultants:   Pulm  Palliative care  Urology  Procedures:   Replacement of foley catheter  Antimicrobials:  Anti-infectives (From admission, onward)   Start     Dose/Rate Route Frequency Ordered Stop   02/17/18 1800  ceFEPIme (MAXIPIME) 1 g in sodium chloride 0.9 % 100 mL IVPB  Status:  Discontinued     1 g 200 mL/hr over 30 Minutes Intravenous Every 8 hours 02/17/18 1052 02/21/18 1718   02/16/18 0800  ceFEPIme (MAXIPIME) 1 g in sodium chloride 0.9 % 100 mL IVPB  Status:  Discontinued     1 g 200 mL/hr over 30 Minutes Intravenous Every 12 hours 02/16/18 0513 02/17/18 1052   02/16/18 0600  vancomycin (VANCOCIN) IVPB 1000 mg/200 mL premix  Status:  Discontinued     1,000 mg 200 mL/hr over 60 Minutes Intravenous Every 24 hours 02/16/18 0513 02/16/18 1031   02/15/18 2000  ceFEPIme (MAXIPIME) 1 g in sodium chloride 0.9 % 100 mL IVPB  Status:  Discontinued     1 g 200 mL/hr over 30 Minutes Intravenous Every 24 hours 02/15/18 0023 02/16/18 0513   02/15/18 0023  vancomycin variable dose per unstable renal function (pharmacist  dosing)  Status:  Discontinued      Does not apply See admin instructions 02/15/18 0023 02/16/18 0514   02/14/18 2145  ceFEPIme (MAXIPIME) 1 g in sodium chloride 0.9 % 100 mL IVPB     1 g 200 mL/hr over 30 Minutes Intravenous  Once 02/14/18 2130 02/14/18 2241   02/14/18 2145  vancomycin (VANCOCIN) 2,000 mg in sodium chloride 0.9 % 500 mL IVPB     2,000 mg 250 mL/hr over 120 Minutes Intravenous STAT 02/14/18 2131 02/15/18 0115         Subjective: Denies any new complaints. Breathing seems improved  Objective: Vitals:   02/24/18 0724 02/24/18 1058 02/24/18 1217 02/24/18 1405  BP:   108/73   Pulse:  68 (!) 123   Resp:   18   Temp:      TempSrc:      SpO2: 98%  97% 93%  Weight:      Height:        Intake/Output Summary (Last 24 hours) at 02/24/2018 1528 Last data filed at 02/24/2018 1218 Gross per 24 hour  Intake 600 ml  Output 2350 ml  Net -1750 ml   Filed Weights   02/17/18 1100  Weight: 99.6 kg    Examination:  General: NAD   Cardiovascular: S1, S2 present  Respiratory: Coarse BS bilaterally  Abdomen: Soft, nontender, nondistended, bowel sounds present  Musculoskeletal: No bilateral pedal edema noted  Skin: Normal  Psychiatry: Normal mood    Data Reviewed: I have personally reviewed following labs and imaging studies  CBC: Recent Labs  Lab 02/18/18 0538 02/20/18 0527 02/21/18 0546 02/22/18 0641  WBC 15.5* 20.4* 17.8* 19.6*  NEUTROABS  --   --   --  15.7*  HGB 13.4 13.7 13.0 13.8  HCT 42.7 43.6 41.2 43.0  MCV 98.2 98.0 100.0 97.5  PLT 459* 516* 453* 259*   Basic Metabolic Panel: Recent Labs  Lab 02/20/18 0527 02/21/18 0546 02/22/18 0641 02/23/18 0544 02/24/18 0557  NA 142 141 138 139 139  K 3.7 3.9 3.8 3.9 3.9  CL 107 107 101 104 101  CO2 25 27 26 25 25   GLUCOSE 140* 146* 177* 117* 190*  BUN 23 22 24* 20 28*  CREATININE 0.97 0.98 1.14 0.99 1.12  CALCIUM 8.0* 7.8* 8.0* 8.2* 8.2*  MG  --  1.4* 1.5* 1.8  --    GFR: Estimated  Creatinine Clearance: 59.7 mL/min (by C-G formula based on SCr of 1.12 mg/dL). Liver Function Tests: Recent Labs  Lab 02/21/18 0546  AST 19  ALT 23  ALKPHOS 112  BILITOT 1.6*  PROT 4.9*  ALBUMIN 2.4*   No results for input(s): LIPASE, AMYLASE in the last 168 hours. No results for input(s): AMMONIA in the last 168 hours. Coagulation Profile: Recent Labs  Lab 02/20/18 0527 02/21/18 0546 02/22/18 0641 02/23/18 0544 02/24/18 0557  INR 1.75 1.71 1.60 1.67 1.87   Cardiac Enzymes: No results for input(s): CKTOTAL, CKMB, CKMBINDEX, TROPONINI in the last 168 hours. BNP (last 3 results) No results for input(s): PROBNP in the last 8760 hours. HbA1C: No results for input(s): HGBA1C in the last 72 hours. CBG: Recent Labs  Lab 02/23/18 1155 02/23/18 1629 02/23/18 2207 02/24/18 0752 02/24/18 1130  GLUCAP 229* 120* 303* 159* 276*   Lipid Profile: No results for input(s): CHOL, HDL, LDLCALC, TRIG, CHOLHDL, LDLDIRECT in the last 72 hours. Thyroid Function Tests: No results for input(s): TSH, T4TOTAL, FREET4, T3FREE, THYROIDAB in the last 72 hours. Anemia Panel: No results for input(s): VITAMINB12, FOLATE, FERRITIN, TIBC, IRON, RETICCTPCT in the last 72 hours. Sepsis Labs: Recent Labs  Lab 02/21/18 0546  PROCALCITON <0.10    Recent Results (from the past 240 hour(s))  Urine culture     Status: None   Collection Time: 02/14/18 10:11 PM  Result Value Ref Range Status   Specimen Description   Final    URINE, CATHETERIZED Performed at [facility] [address], [place], [place] [address]    Special Requests   Final    NONE Performed at [facility], [place] [address], [place], [place] [address]    Culture   Final    NO GROWTH Performed at [facility], [place] [address] [place], [place] [address]    Report Status 02/16/2018 FINAL  Final  MRSA PCR Screening     Status: None   Collection Time: 02/15/18  2:25 AM  Result Value Ref  Range Status   MRSA by PCR NEGATIVE NEGATIVE Final    Comment:        The GeneXpert MRSA Assay (FDA approved for NASAL specimens only), is one component of a comprehensive MRSA colonization surveillance program. It is not intended to diagnose MRSA infection nor to guide or monitor treatment for MRSA infections. Performed at [facility], [place] [address] [place], [place] [address]  Culture, blood (routine x 2)     Status: None   Collection Time: 02/15/18  2:29 AM  Result Value Ref Range Status   Specimen Description   Final    BLOOD RIGHT HAND Performed at [facility] [address] [place], [place] [address]    Special Requests   Final    BOTTLES DRAWN AEROBIC AND ANAEROBIC Blood Culture adequate volume Performed at [facility] [address] [place], [place] [address]    Culture   Final    NO GROWTH 5 DAYS Performed at [facility], [place] [address], [place], [place] [address]    Report Status 02/20/2018 FINAL  Final  Culture, blood (routine x 2)     Status: None   Collection Time: 02/15/18  2:29 AM  Result Value Ref Range Status   Specimen Description   Final    BLOOD LEFT HAND Performed at [facility] [address], [place], [place] [address]    Special Requests   Final    BOTTLES DRAWN AEROBIC ONLY Blood Culture adequate volume Performed at [facility] [address], [place], [place] [address]    Culture   Final    NO GROWTH 5 DAYS Performed at [facility], [place] [address] [place], [place] [address]    Report Status 02/20/2018 FINAL  Final  Respiratory Panel by PCR     Status: None   Collection Time: 02/15/18  8:30 AM  Result Value Ref Range Status   Adenovirus NOT DETECTED NOT DETECTED Final   Coronavirus 229E NOT DETECTED NOT DETECTED Final   Coronavirus HKU1 NOT DETECTED NOT DETECTED Final   Coronavirus NL63 NOT DETECTED NOT DETECTED Final     Coronavirus OC43 NOT DETECTED NOT DETECTED Final   Metapneumovirus NOT DETECTED NOT DETECTED Final   Rhinovirus / Enterovirus NOT DETECTED NOT DETECTED Final   Influenza A NOT DETECTED NOT DETECTED Final   Influenza B NOT DETECTED NOT DETECTED Final   Parainfluenza Virus 1 NOT DETECTED NOT DETECTED Final   Parainfluenza Virus 2 NOT DETECTED NOT DETECTED Final   Parainfluenza Virus 3 NOT DETECTED NOT DETECTED Final   Parainfluenza Virus 4 NOT DETECTED NOT DETECTED Final   Respiratory Syncytial Virus NOT DETECTED NOT DETECTED Final   Bordetella pertussis NOT DETECTED NOT DETECTED Final   Chlamydophila pneumoniae NOT DETECTED NOT DETECTED Final   Mycoplasma pneumoniae NOT DETECTED NOT DETECTED Final    Comment: Performed at [facility], [place] [address], [place], [place] [address]         Radiology Studies: No results found.      Scheduled Meds: . budesonide (PULMICORT) nebulizer solution  0.25 mg Nebulization BID  . diltiazem  300 mg Oral Daily  . furosemide  40 mg Oral Daily  . insulin aspart  0-9 Units Subcutaneous TID WC  . ipratropium-albuterol  3 mL Nebulization TID  . levothyroxine  112 mcg Oral Q0600  . mouth rinse  15 mL Mouth Rinse BID  . metoprolol tartrate  12.5 mg Oral BID  . mometasone-formoterol  2 puff Inhalation BID  . predniSONE  40 mg Oral Q breakfast  . warfarin  5 mg Oral ONCE-1800  . Warfarin - Pharmacist Dosing Inpatient   Does not apply q1800   Continuous Infusions:    LOS: 10 days       Alma Friendly, MD [facility]   If 7PM-7AM, please contact night-coverage 02/24/2018,  3:28 PM

## 2018-02-25 ENCOUNTER — Inpatient Hospital Stay (HOSPITAL_COMMUNITY): Payer: Medicare Other

## 2018-02-25 DIAGNOSIS — E039 Hypothyroidism, unspecified: Secondary | ICD-10-CM

## 2018-02-25 DIAGNOSIS — I10 Essential (primary) hypertension: Secondary | ICD-10-CM

## 2018-02-25 LAB — BASIC METABOLIC PANEL
Anion gap: 9 (ref 5–15)
BUN: 26 mg/dL — ABNORMAL HIGH (ref 8–23)
CO2: 27 mmol/L (ref 22–32)
Calcium: 8.1 mg/dL — ABNORMAL LOW (ref 8.9–10.3)
Chloride: 101 mmol/L (ref 98–111)
Creatinine, Ser: 0.99 mg/dL (ref 0.61–1.24)
Glucose, Bld: 202 mg/dL — ABNORMAL HIGH (ref 70–99)
Potassium: 3.7 mmol/L (ref 3.5–5.1)
Sodium: 137 mmol/L (ref 135–145)

## 2018-02-25 LAB — GLUCOSE, CAPILLARY
GLUCOSE-CAPILLARY: 236 mg/dL — AB (ref 70–99)
Glucose-Capillary: 174 mg/dL — ABNORMAL HIGH (ref 70–99)

## 2018-02-25 LAB — CBC
HCT: 40.8 % (ref 39.0–52.0)
Hemoglobin: 13.1 g/dL (ref 13.0–17.0)
MCH: 31 pg (ref 26.0–34.0)
MCHC: 32.1 g/dL (ref 30.0–36.0)
MCV: 96.7 fL (ref 80.0–100.0)
NRBC: 0 % (ref 0.0–0.2)
Platelets: 398 10*3/uL (ref 150–400)
RBC: 4.22 MIL/uL (ref 4.22–5.81)
RDW: 14.6 % (ref 11.5–15.5)
WBC: 23.3 10*3/uL — AB (ref 4.0–10.5)

## 2018-02-25 LAB — PROTIME-INR
INR: 2.47
PROTHROMBIN TIME: 26.4 s — AB (ref 11.4–15.2)

## 2018-02-25 MED ORDER — PREDNISONE 20 MG PO TABS: 40.0000 mg | ORAL_TABLET | Freq: Every day | ORAL | 0 refills | Status: AC

## 2018-02-25 MED ORDER — WARFARIN SODIUM 5 MG PO TABS
5.0000 mg | ORAL_TABLET | Freq: Once | ORAL | Status: AC
  Administered 2018-02-25: 5 mg via ORAL
  Filled 2018-02-25: qty 1

## 2018-02-25 NOTE — Progress Notes (Signed)
Physical Therapy Treatment Patient Details Name: Justin Garner MRN: [id number] DOB: 02-11-1933 Today's Date: 02/25/2018    History of Present Illness Patient is an 84 y/o male admitted from SNF due to wekaness, blood in urine and SOB due to HCAP.  PMH includes a-fib, PE, HTN, CHF, COPD, DM.    PT Comments    Pt agreeable and cooperative, fatigues easily but decr assist overall compared to last session; continue to rec SNF  Follow Up Recommendations  Supervision/Assistance - 24 hour;SNF     Equipment Recommendations  None recommended by PT    Recommendations for Other Services       Precautions / Restrictions Precautions Precautions: Fall Restrictions Weight Bearing Restrictions: No    Mobility  Bed Mobility Overal bed mobility: Needs Assistance Bed Mobility: Supine to Sit   Sidelying to sit: Mod assist          Transfers Overall transfer level: Needs assistance Equipment used: Rolling walker (2 wheeled) Transfers: Sit to/from [employer] Transfers Sit to Stand: From elevated surface;Mod assist Stand pivot transfers: Min assist       General transfer comment: cues for hand placement, assist to rise and stabilize  Ambulation/Gait Ambulation/Gait assistance: Min assist Gait Distance (Feet): 5 Feet Assistive device: Rolling walker (2 wheeled) Gait Pattern/deviations: Step-to pattern;Trunk flexed     General Gait Details: pivotal steps, fwd and back to chair; cues for RW position and posture   Stairs             Wheelchair Mobility    Modified Rankin (Stroke Patients Only)       Balance                                            Cognition Arousal/Alertness: Awake/alert Behavior During Therapy: WFL for tasks assessed/performed   Area of Impairment: Problem solving                   Current Attention Level: Sustained   Following Commands: Follows one step commands with increased time;Follows multi-step  commands inconsistently     Problem Solving: Slow processing;Decreased initiation;Difficulty sequencing        Exercises General Exercises - Lower Extremity Long Arc Quad: AROM;Both;10 reps;Seated Toe Raises: AROM;Both;10 reps Heel Raises: AROM;Left;10 reps    General Comments        Pertinent Vitals/Pain Pain Assessment: No/denies pain    Home Living                      Prior Function            PT Goals (current goals can now be found in the care plan section) Acute Rehab PT Goals Patient Stated Goal: none stated PT Goal Formulation: With patient Time For Goal Achievement: 03/02/18 Potential to Achieve Goals: Good Progress towards PT goals: Progressing toward goals    Frequency    Min 3X/week      PT Plan Current plan remains appropriate    Co-evaluation              AM-PAC PT "6 Clicks" Mobility   Outcome Measure  Help needed turning from your back to your side while in a flat bed without using bedrails?: A Lot Help needed moving from lying on your back to sitting on the side of a flat bed without using bedrails?: A Lot Help  needed moving to and from a bed to a chair (including a wheelchair)?: A Little Help needed standing up from a chair using your arms (e.g., wheelchair or bedside chair)?: A Little Help needed to walk in hospital room?: A Little Help needed climbing 3-5 steps with a railing? : A Lot 6 Click Score: 15    End of Session Equipment Utilized During Treatment: Gait belt;Oxygen Activity Tolerance: Patient tolerated treatment well Patient left: in chair;with call bell/phone within reach;with chair alarm set   PT Visit Diagnosis: Other abnormalities of gait and mobility (R26.89);Muscle weakness (generalized) (M62.81)     Time: 0488-8916 PT Time Calculation (min) (ACUTE ONLY): 18 min  Charges:  $Therapeutic Activity: 8-22 mins                     Kenyon Ana, PT  Pager: [phone] Acute Rehab Dept [facility]):  [phone]   02/25/2018    [facility] 02/25/2018, 3:18 PM

## 2018-02-25 NOTE — Discharge Summary (Signed)
Discharge Summary  Justin Garner KNL:[id number] DOB: 1932/12/06  PCP: [name], MD  Admit date: 02/14/2018 Discharge date: 02/25/2018  Time spent: 35 mins  Recommendations for Outpatient Follow-up:  1. PCP  Discharge Diagnoses:  Active Hospital Problems   Diagnosis Date Noted  . Acute respiratory failure with hypoxia ([place]) 02/08/2018  . HCAP (healthcare-associated pneumonia) 02/15/2018  . CKD (chronic kidney disease) stage 3, GFR 30-59 ml/min (HCC) 02/15/2018  . QT prolongation 02/15/2018  . Type 2 diabetes mellitus ([place]) 02/15/2018  . Dementia ([place]) 02/15/2018  . Physical deconditioning 02/15/2018  . AKI (acute kidney injury) ([place]) 02/14/2018  . Atrial fibrillation with RVR ([place]) 06/09/2016  . Acute metabolic encephalopathy 79/04/4095  . COPD (chronic obstructive pulmonary disease) ([place])   . Supratherapeutic INR   . CAD in native artery   . UTI (urinary tract infection) 06/13/2015  . HLD (hyperlipidemia) 06/13/2015  . Hypothyroidism 06/13/2015  . Chronic systolic CHF (congestive heart failure) ([place])   . Essential hypertension, benign 05/28/2013  . Pneumothorax 02/21/2011    Resolved Hospital Problems  No resolved problems to display.    Discharge Condition: Stable  Diet recommendation: Heart healthy/mod carb  Vitals:   02/25/18 0413 02/25/18 0918  BP: 91/67   Pulse: 83   Resp: (!) 24   Temp: (!) 97.5 F (36.4 C)   SpO2: 100% 96%    History of present illness:  84 y.o.malewith medical history significant ofCAD, CKD stage III, COPD, chronic systolic CHF, type 2 diabetes, GERD, hypertension, hyperlipidemia, paroxysmal A. fib on Coumadin, history of lung cancer status post right upper lobe lobectomy in 2009 prior PE presenting to the hospital for evaluation of shortness of breath and blood in Foley catheter. Patient was recently admitted from February 04, 2018 to February 11, 2018 for shortness of breath, confusion, and rhinovirus infection. He was  discharged home on 2 L oxygen via nasal cannula. He was found to require higher doses of oxygen. Was noted to have urinary retention requiring a change in Foley catheter. Chest x-ray showed small pneumothorax. CT scan showed the same and also showed evidence for pneumonia. Patient was hospitalized and started on broad-spectrum antibiotics.    Today, pt denies any new complaints, stable to be discharged to SNF.  Hospital Course:  Principal Problem:   Acute respiratory failure with hypoxia (HCC) Active Problems:   Pneumothorax   Essential hypertension, benign   UTI (urinary tract infection)   Chronic systolic CHF (congestive heart failure) (HCC)   Hypothyroidism   HLD (hyperlipidemia)   CAD in native artery   Supratherapeutic INR   COPD (chronic obstructive pulmonary disease) (HCC)   Atrial fibrillation with RVR (HCC)   Acute metabolic encephalopathy   AKI (acute kidney injury) ([place])   HCAP (healthcare-associated pneumonia)   CKD (chronic kidney disease) stage 3, GFR 30-59 ml/min (HCC)   QT prolongation   Type 2 diabetes mellitus ([place])   Dementia ([place])   Physical deconditioning  Healthcare associated pneumonia with acute hypoxic respiratory failure/pneumothorax/sepsis, present on admission Patient seen by pulmonology for pneumonthorax Did not require any intervention for the pneumothorax (last note from 12/2 who note if pt requires chest tube, he'll require pleurodesis). Suspicion is that one of blebs ruptured. CXR from 12/2 with improved "tiny" L apical pneumonthorax, repeat with resolved Seen by speech on 11/30, no over s/s of aspiration or penetration, recommended dysphagia 3 diet with thin liquids Negative MRSA PCR Negative blood cx x 5 days Vancomycin d/c'd on 11/30 Will d/c Cefepime 11/28 -  12/5, completed 8 days with repeat procalcitonin <0.10 Repeat CXR for 12/5 showed pulm vascular congestion, with stable pneumothorax, repeat with improved CHF Continue PO  lasix  Leukocytosis Afebrile, with leukocytosis which appears chronic, ongoing for years Procalcitonin negative Will need outpt follow up with hematology  Chronic atrial fibrillation with RVR Patient with known history of atrial fibrillation.  Continue metoprolol and diltiazem  Echo 09/2016 with EF 35-40% TSH 4.5 on 11/19 with normal free T4 Chadsvasc 6  Continue warfarin per pharmacy  Hypomagnesemia  Replace prn  Acute kidney injury on chronic kidney disease stage III with hematuria/acute urinary retention Resolved Patient had a indwelling Foley catheter which was placed during his previous hospitalization.  Apparently the catheter was malpositioned resulting in retention, which was thought to be reason for his AKI Catheter was replaced in the emergency department.  Seen by Dr. Diona Fanti, without any recommendation for intervention Renal US without any hydro (but some mild-mod bladder distention (11/29) Needs outpatient urology follow up  Abnormal UA  Culture without any growth. Abnormal UA was most likely due to hematuria. No evidence for UTI.  Follow up with urology as outpatient  QT prolongation on EKG Caution with QT prolonging meds Follow repeat EKG as outpatient Follow mag/k  Acute metabolic encephalopathy Resolved A&Ox2, likely some delirium in setting of acute hospitalization Delirium precautuins  History of COPD Continue duoneb, started prednisone for a total of 5 days (last dose on 73/71/06)  Chronic systolic CHF Ejection fraction noted to be 35 to 40% based on echo from June 2018 Continue PO lasix  History of coronary artery disease Stable. Continue current medications.  Essential hypertension Blood pressure stable.  Type 2 diabetes mellitus, controlled HbA1c 6.0 on 02/07/2018. Continue home regimen  History of dementia with history of behavioral disturbances Stable currently. Patient was started on Zyprexa during his previous  hospitalization, held during this admission due to prolonged QT. May restart if need and with caution. Repeat EKG  History of hypothyroidism Synthroid  Physical deconditioning PT/OT         Malnutrition Type:      Malnutrition Characteristics:      Nutrition Interventions:      Estimated body mass index is 27.45 kg/m as calculated from the following:   Height as of this encounter: 6\' 3"  (1.905 m).   Weight as of this encounter: 99.6 kg.    Procedures:  None  Consultations:  Pulm  Palliative team  Urology   Discharge Exam: BP 91/67 (BP Location: Right Arm)   Pulse 83   Temp (!) 97.5 F (36.4 C) (Oral)   Resp (!) 24   Ht 6\' 3"  (1.905 m)   Wt 99.6 kg   SpO2 96%   BMI 27.45 kg/m   General: NAD Cardiovascular: S1, S2 present Respiratory: CTAB  Discharge Instructions You were cared for by a hospitalist during your hospital stay. If you have any questions about your discharge medications or the care you received while you were in the hospital after you are discharged, you can call the unit and asked to speak with the hospitalist on call if the hospitalist that took care of you is not available. Once you are discharged, your primary care physician will handle any further medical issues. Please note that NO REFILLS for any discharge medications will be authorized once you are discharged, as it is imperative that you return to your primary care physician (or establish a relationship with a primary care physician if you do not have one)  for your aftercare needs so that they can reassess your need for medications and monitor your lab values.   Allergies as of 02/25/2018   No Known Allergies     Medication List    TAKE these medications   acetaminophen 325 MG tablet Commonly known as:  TYLENOL Take 2 tablets (650 mg total) by mouth every 6 (six) hours as needed for mild pain (or Fever >/= 101).   albuterol 108 (90 Base) MCG/ACT inhaler Commonly known  as:  PROVENTIL HFA;VENTOLIN HFA Inhale 2 puffs into the lungs every 6 (six) hours as needed for wheezing or shortness of breath.   atorvastatin 20 MG tablet Commonly known as:  LIPITOR TAKE 1 TABLET BY MOUTH  DAILY What changed:  when to take this   budesonide-formoterol 80-4.5 MCG/ACT inhaler Commonly known as:  SYMBICORT Inhale 2 puffs into the lungs every morning.   diltiazem 360 MG 24 hr capsule Commonly known as:  CARDIZEM CD Take 1 capsule (360 mg total) by mouth daily.   DRY EYES OP Place 1 drop into both eyes as needed (dry eye).   fish oil-omega-3 fatty acids 1000 MG capsule Take 1 g by mouth daily.   furosemide 20 MG tablet Commonly known as:  LASIX Take 2 tablets (40 mg total) by mouth daily.   glipiZIDE 10 MG tablet Commonly known as:  GLUCOTROL Take 10 mg by mouth daily before breakfast.   levothyroxine 112 MCG tablet Commonly known as:  SYNTHROID, LEVOTHROID Take 112 mcg by mouth daily.   lisinopril 5 MG tablet Commonly known as:  PRINIVIL,ZESTRIL Take 0.5 tablets (2.5 mg total) by mouth daily.   metoprolol tartrate 25 MG tablet Commonly known as:  LOPRESSOR TAKE ONE-HALF TABLET BY  MOUTH TWO TIMES DAILY What changed:  See the new instructions.   mirtazapine 15 MG tablet Commonly known as:  REMERON Take 15 mg by mouth at bedtime.   nitroGLYCERIN 0.4 MG SL tablet Commonly known as:  NITROSTAT Place 1 tablet (0.4 mg total) under the tongue every 5 (five) minutes as needed for chest pain up to 3 doses   OLANZapine 2.5 MG tablet Commonly known as:  ZYPREXA Take 1 tablet (2.5 mg total) by mouth at bedtime.   omeprazole 20 MG capsule Commonly known as:  PRILOSEC Take 20 mg by mouth daily.   predniSONE 20 MG tablet Commonly known as:  DELTASONE Take 2 tablets (40 mg total) by mouth daily with breakfast for 2 days. Start taking on:  02/26/2018   sennosides-docusate sodium 8.6-50 MG tablet Commonly known as:  SENOKOT-S Take 1 tablet by mouth  daily as needed for constipation.   warfarin 5 MG tablet Commonly known as:  COUMADIN Take as directed. If you are unsure how to take this medication, talk to your nurse or doctor. Original instructions:  TAKE AS DIRECTED BY  [facility] What changed:  See the new instructions.      No Known Allergies Follow-up Information    Polite, Jori Moll, MD. Schedule an appointment as soon as possible for a visit in 1 week(s).   Specialty:  Internal Medicine Contact information: [address] [place] [address] [phone]        Jettie Booze, MD .   Specialties:  Cardiology, Radiology, Interventional Cardiology Contact information: [address] [place] [address] [phone]            The results of significant diagnostics from this hospitalization (including imaging, microbiology, ancillary  and laboratory) are listed below for reference.    Significant Diagnostic Studies: Dg Chest 2 View  Result Date: 02/21/2018 CLINICAL DATA:  Hypoxia. EXAM: CHEST - 2 VIEW COMPARISON:  02/18/2018. FINDINGS: Cardiomegaly. Diffuse bilateral pulmonary interstitial prominence and bilateral pleural effusions. These findings are consistent with congestive heart failure. Stable left apical pneumothorax. IMPRESSION: 1. Cardiomegaly. Diffuse bilateral from interstitial prominence and bilateral pleural effusions. Findings consistent with CHF. 2.  Stable known left apical pneumothorax. Electronically Signed   By: Marcello Moores  Register   On: 02/21/2018 13:34   Dg Chest 2 View  Result Date: 02/11/2018 CLINICAL DATA:  Shortness of Breath EXAM: CHEST - 2 VIEW COMPARISON:  02/07/2018 FINDINGS: Cardiomegaly with vascular congestion and interstitial prominence which may reflect interstitial edema. Small right effusion, stable. Bibasilar atelectasis. IMPRESSION: Cardiomegaly with vascular congestion and persistent interstitial prominence, likely interstitial edema.  Small right effusion. Electronically Signed   By: Rolm Baptise M.D.   On: 02/11/2018 09:02   Dg Chest 2 View  Result Date: 02/04/2018 CLINICAL DATA:  Cough for a few days EXAM: CHEST - 2 VIEW COMPARISON:  May 16, 2017 FINDINGS: Blunting of the right costophrenic angle is stable. Mild interstitial prominence is unchanged. Stable cardiomegaly. No other acute abnormalities or changes. IMPRESSION: 1. Cardiomegaly. 2. Stable mildly prominent interstitial markings, could be due to the patient's known underlying emphysematous changes or mild chronic edema. Recommend clinical correlation. 3. Chronic blunting of the right costophrenic angle consistent with a small effusion seen on previous CT imaging. 4. No other acute abnormalities. Electronically Signed   By: Dorise Bullion III M.D   On: 02/04/2018 23:06   Ct Head Wo Contrast  Result Date: 02/05/2018 CLINICAL DATA:  Increasing memory lapses. Urinary incontinence. History of lung cancer. EXAM: CT HEAD WITHOUT CONTRAST TECHNIQUE: Contiguous axial images were obtained from the base of the skull through the vertex without intravenous contrast. COMPARISON:  05/16/2017 FINDINGS: Brain: Diffuse cerebral atrophy. Ventricular dilatation consistent with central atrophy. Low-attenuation changes in the deep white matter consistent with small vessel ischemia. Prominent CSF space in the posterior fossa likely representing prominent cisterna magna or arachnoid cyst. No mass effect or midline shift. No abnormal extra-axial fluid collections. Gray-white matter junctions are distinct. Basal cisterns are not effaced. Vascular: Moderate intracranial arterial calcifications. Skull: Calvarium appears intact. Sinuses/Orbits: Mucosal thickening in the paranasal sinuses. No acute air-fluid levels. Mastoid air cells are clear. Other: None. IMPRESSION: No acute intracranial abnormalities. Chronic atrophy and small vessel ischemic changes. Electronically Signed   By: Lucienne Capers  M.D.   On: 02/05/2018 04:35   Ct Chest Wo Contrast  Result Date: 02/14/2018 CLINICAL DATA:  Shortness of breath. Question pneumothorax on radiograph. EXAM: CT CHEST WITHOUT CONTRAST TECHNIQUE: Multidetector CT imaging of the chest was performed following the standard protocol without IV contrast. COMPARISON:  Radiograph earlier this day. Chest CT 04/26/2017 FINDINGS: Cardiovascular: Aortic atherosclerosis without aneurysm. Multi chamber cardiomegaly. There are coronary artery calcifications. Small amount of loculated pericardial fluid as before. Mediastinum/Nodes: Shotty mediastinal lymph nodes, for example left anterior carinal node measures 10 mm short axis. Limited assessment for hilar adenopathy in the absence of IV contrast. Surgical clips at the right hilum. No esophageal wall thickening. Lungs/Pleura: Advanced bullous emphysema, with large bulla in the left lung apex and right middle lobe. Left pneumothorax with small apical component, and more prominent anteromedial and inferior component. Pneumothorax likely roughly 10%. Left lower lobe consolidation may be atelectasis or pneumonia. Patchy and ground-glass opacities in the right lower lobe.  Small right pleural effusion. Upper Abdomen: Motion artifacts. No definite acute finding. Musculoskeletal: Exaggerated thoracic kyphosis. Bones are under mineralized. There are no acute or suspicious osseous abnormalities. IMPRESSION: 1. CT confirms left pneumothorax, roughly 10%. Greatest pneumothorax component inferior and anteromedially. 2. Left lower lobe consolidation, suspicious for pneumonia. Patchy opacities at the right lung base suggest infectious etiology. 3. Advanced bullous emphysema with large bulla in the left lung apex and right middle lobe. Patchy and ground-glass opacities in the right lower lobe may be infectious or inflammatory. 4. Small bilateral pleural effusions. 5. Advanced bullous emphysema. 6. Coronary artery calcifications. Aortic  Atherosclerosis (ICD10-I70.0) and Emphysema (ICD10-J43.9). These results were called by telephone at the time of interpretation on 02/14/2018 at 11:13 pm to Dr. Isla Pence , who verbally acknowledged these results. Electronically Signed   By: Keith Rake M.D.   On: 02/14/2018 23:13   US Renal  Result Date: 02/15/2018 CLINICAL DATA:  Initial evaluation for acute urinary retention. EXAM: RENAL / URINARY TRACT ULTRASOUND COMPLETE COMPARISON:  None available. FINDINGS: Right Kidney: Renal measurements: 11.9 x 4.7 x 6.5 cm = volume: 190.4 mL . Echogenicity within normal limits. No mass or hydronephrosis visualized. Left Kidney: Renal measurements: 8.9 x 4.3 x 5.1 cm = volume: 11.3 mL. Left kidney demonstrates a somewhat lobulated contour with mildly heterogeneous echotexture. No hydronephrosis. 2.7 x 2.3 x 1.9 cm simple cyst present at the upper pole. Bladder: Foley catheter in place within the bladder. Bladder itself is mild to moderately distended and not decompressed. Enlarged prostate measuring 5.7 x 4.9 x 6.1 cm, with volume of 87.6 cc IMPRESSION: 1. Persistent mild-to-moderate bladder distension with Foley catheter in place. Finding raises the possibility for clogging or malpositioning of the Foley. Bedside check recommended. 2. Enlarged prostate. 3. No hydronephrosis. 4. 2.7 cm simple left renal cyst. Electronically Signed   By: Jeannine Boga M.D.   On: 02/15/2018 01:12   Dg Chest Port 1 View  Result Date: 02/25/2018 CLINICAL DATA:  Coronary artery disease. Chronic kidney disease stage 3. cough. EXAM: PORTABLE CHEST 1 VIEW COMPARISON:  02/21/2018 FINDINGS: Moderate cardiomegaly. Vascular congestion and pulmonary edema have improved. No pneumothorax. Bilateral pleural effusions. Left apical blebs. IMPRESSION: Improved CHF Bilateral pleural effusions. Electronically Signed   By: Marybelle Killings M.D.   On: 02/25/2018 09:04   Dg Chest Port 1 View  Result Date: 02/18/2018 CLINICAL DATA:   Recent pneumothorax EXAM: PORTABLE CHEST 1 VIEW COMPARISON:  02/17/2018 FINDINGS: The heart remains mildly enlarged. Bilateral diffuse hazy airspace disease and bilateral pleural effusions are stable. Tiny left apical pneumothorax has further improved no evidence of right pneumothorax. IMPRESSION: Improved tiny left apical pneumothorax. Stable bilateral airspace disease and bilateral small pleural effusions Electronically Signed   By: Marybelle Killings M.D.   On: 02/18/2018 10:38   Dg Chest Port 1 View  Result Date: 02/17/2018 CLINICAL DATA:  Pneumothorax. EXAM: PORTABLE CHEST 1 VIEW COMPARISON:  02/16/2018 FINDINGS: The patient is rotated to the left. The cardiomediastinal silhouette is unchanged. A small left apical pneumothorax is unchanged. Bibasilar lung opacities are unchanged, likely with underlying small pleural effusions. IMPRESSION: 1. Unchanged small left apical pneumothorax. 2. Unchanged bibasilar pneumonia or atelectasis with small pleural effusions. Electronically Signed   By: Logan Bores M.D.   On: 02/17/2018 07:18   Dg Chest Port 1 View  Result Date: 02/16/2018 CLINICAL DATA:  Shortness of breath with history of congestive heart failure. History of lung cancer post right upper lobectomy. EXAM: PORTABLE CHEST 1 VIEW  COMPARISON:  02/15/2018 FINDINGS: Lungs are adequately inflated and demonstrate a stable small left apical pneumothorax. Persistent bibasilar opacification likely small effusions with associated atelectasis. Infection in the lung bases is possible. Cardiomediastinal silhouette and remainder of the exam is unchanged. IMPRESSION: Persistent bibasilar opacification which may be due to small effusions with atelectasis, although infection in the lung bases is possible. Stable small left apical pneumothorax. Electronically Signed   By: Marin Olp M.D.   On: 02/16/2018 15:37   Dg Chest Port 1 View  Result Date: 02/15/2018 CLINICAL DATA:  Left pneumothorax. EXAM: PORTABLE CHEST 1 VIEW  COMPARISON:  Radiographs and CT yesterday. FINDINGS: Apical and lateral components of small left pneumothorax again visualized, the inferior component on CT (which was the largest component) is not visualized radiographically. Left apical blebs. Left lower lobe consolidation again seen. Increasing patchy right lung base opacity. Unchanged heart size and mediastinal contours. IMPRESSION: 1. Apical and lateral components of small left pneumothorax again seen, slightly increased from prior radiograph. The larger inferior component is not visualized radiographically. 2. Left lower lobe consolidation again seen. Increasing patchy right lung base opacity. Electronically Signed   By: Keith Rake M.D.   On: 02/15/2018 03:42   Dg Chest Port 1 View  Result Date: 02/14/2018 CLINICAL DATA:  Dyspnea, history of right upper lobectomy for lung cancer EXAM: PORTABLE CHEST 1 VIEW COMPARISON:  02/11/2018 chest radiograph. FINDINGS: Stable cardiomediastinal silhouette with mild cardiomegaly. Stable postsurgical changes from partial posterior right third rib resection and right upper lobectomy. No right pneumothorax. Stable pleural-parenchymal scarring at right costophrenic angle. Bullous emphysema at the left lung apex with suggestion of new tiny apical left pneumothorax. No pulmonary edema. New left retrocardiac consolidation. IMPRESSION: 1. Bullous emphysema at the left lung apex with suggestion of new tiny apical left pneumothorax. No mediastinal shift. 2. New left retrocardiac consolidation, which could represent atelectasis, aspiration or pneumonia. 3. Stable postsurgical changes in the right hemithorax from right upper lobectomy. Critical Value/emergent results were called by telephone at the time of interpretation on 02/14/2018 at 9:27 pm to Dr. Isla Pence , who verbally acknowledged these results. Electronically Signed   By: Ilona Sorrel M.D.   On: 02/14/2018 21:29   Dg Chest Port 1 View  Result Date:  02/07/2018 CLINICAL DATA:  84 year old male with fever, productive cough, confusion. EXAM: PORTABLE CHEST 1 VIEW COMPARISON:  Chest radiographs 02/04/2018 and earlier. FINDINGS: Portable AP semi upright view at 1438 hours. A small right pleural effusion appears increased. Stable cardiac size and mediastinal contours. Pulmonary vascularity and/or interstitial opacity appears increased. No pneumothorax. No confluent pulmonary opacity. Negative visible bowel gas pattern. No acute osseous abnormality identified. IMPRESSION: Increased pulmonary interstitial opacity and small right pleural effusion since 02/04/2018. Consider mild interstitial edema versus viral/atypical respiratory infection. Electronically Signed   By: Genevie Ann M.D.   On: 02/07/2018 15:07    Microbiology: No results found for this or any previous visit (from the past 240 hour(s)).   Labs: Basic Metabolic Panel: Recent Labs  Lab 02/21/18 0546 02/22/18 0641 02/23/18 0544 02/24/18 0557 02/25/18 0535  NA 141 138 139 139 137  K 3.9 3.8 3.9 3.9 3.7  CL 107 101 104 101 101  CO2 27 26 25 25 27   GLUCOSE 146* 177* 117* 190* 202*  BUN 22 24* 20 28* 26*  CREATININE 0.98 1.14 0.99 1.12 0.99  CALCIUM 7.8* 8.0* 8.2* 8.2* 8.1*  MG 1.4* 1.5* 1.8  --   --    Liver Function  Tests: Recent Labs  Lab 02/21/18 0546  AST 19  ALT 23  ALKPHOS 112  BILITOT 1.6*  PROT 4.9*  ALBUMIN 2.4*   No results for input(s): LIPASE, AMYLASE in the last 168 hours. No results for input(s): AMMONIA in the last 168 hours. CBC: Recent Labs  Lab 02/20/18 0527 02/21/18 0546 02/22/18 0641 02/25/18 0535  WBC 20.4* 17.8* 19.6* 23.3*  NEUTROABS  --   --  15.7*  --   HGB 13.7 13.0 13.8 13.1  HCT 43.6 41.2 43.0 40.8  MCV 98.0 100.0 97.5 96.7  PLT 516* 453* 487* 398   Cardiac Enzymes: No results for input(s): CKTOTAL, CKMB, CKMBINDEX, TROPONINI in the last 168 hours. BNP: BNP (last 3 results) Recent Labs    02/04/18 2224 02/14/18 2107  BNP 239.7*  148.9*    ProBNP (last 3 results) No results for input(s): PROBNP in the last 8760 hours.  CBG: Recent Labs  Lab 02/24/18 1130 02/24/18 1633 02/24/18 2129 02/25/18 0748 02/25/18 1134  GLUCAP 276* 306* 335* 174* 236*       Signed:  Alma Friendly, MD [facility] 02/25/2018, 1:01 PM

## 2018-02-25 NOTE — Care Management Note (Signed)
Case Management Note  Patient Details  Name: Justin Garner MRN: [id number] Date of Birth: 1932/09/04  Subjective/Objective: Per attending medically stable for d/c back to SNF-[facility] w/palliative care services. CSW already following.                   Action/Plan:dc SNF w/PCS.   Expected Discharge Date:  (unknown)               Expected Discharge Plan:  Skilled Nursing Facility  In-House Referral:  Clinical Social Work  Discharge planning Services  CM Consult  Post Acute Care Choice:    Choice offered to:     DME Arranged:    DME Agency:     HH Arranged:    [place] Agency:     Status of Service:  Completed, signed off  If discussed at [employer], dates discussed:    Additional Comments:  Dessa Phi, RN 02/25/2018, 12:22 PM

## 2018-02-25 NOTE — Progress Notes (Addendum)
Clinical Social Worker facilitated patient discharge including contacting patient family and facility to confirm patient discharge plans.  Clinical information faxed to facility and family agreeable with plan.  CSW arranged ambulance transport via [facility] to [facility] .  RN to call [phone] (pt will go in rm#[id number]) for report prior to discharge.  Clinical Social Worker will sign off for now as social work intervention is no longer needed. Please consult [place] again if new need arises.  Rhea Pink, MSW, [place]

## 2018-02-25 NOTE — Care Management Important Message (Signed)
Important Message  Patient Details  Name: SHREYANSH TIFFANY MRN: [id number] Date of Birth: 12-08-1932   Medicare Important Message Given:  Yes    Kerin Salen 02/25/2018, 1:11 [place] Message  Patient Details  Name: DAIWIK BUFFALO MRN: [id number] Date of Birth: Sep 17, 1932   Medicare Important Message Given:  Yes    Kerin Salen 02/25/2018, 1:11 PM

## 2018-02-25 NOTE — Progress Notes (Signed)
ANTICOAGULATION CONSULT NOTE  Pharmacy Consult for warfarin Indication: atrial fibrillation  No Known Allergies  Patient Measurements: Height: 6\' 3"  (190.5 cm) Weight: 219 lb 9.3 oz (99.6 kg) IBW/kg (Calculated) : 84.5   Vital Signs: Temp: 97.5 F (36.4 C) (12/09 0413) Temp Source: Oral (12/09 0413) BP: 91/67 (12/09 0413) Pulse Rate: 83 (12/09 0413)  Labs: Recent Labs    02/23/18 0544 02/24/18 0557 02/25/18 0535  HGB  --   --  13.1  HCT  --   --  40.8  PLT  --   --  398  LABPROT 19.5* 21.3* 26.4*  INR 1.67 1.87 2.47  CREATININE 0.99 1.12 0.99    Estimated Creatinine Clearance: 67.6 mL/min (by C-G formula based on SCr of 0.99 mg/dL).   Assessment: Pharmacy is consulted to dose warfarin in 84 yo male with PMH of a.fib. Pt was admitted with elevated INR, but INR was allowed to trend down to therapeutic range. Home dose 5 mg qday except 7.5 mg TThSat  Today, 02/25/18   INR therapeutic today at 2.47 nicely  No bleeding issues noted per chart review  CBC stable wnl on 12/9  Eating 100% of meals  Goal of Therapy:  INR 2-3 Monitor platelets by anticoagulation protocol: Yes   Plan:   Warfarin 5 mg PO x 1 tonight   Daily INR  Monitor for signs and symptoms of bleeding  Eudelia Bunch, Pharm.D [phone] 02/25/2018 9:23 AM

## 2018-03-16 ENCOUNTER — Other Ambulatory Visit: Payer: Self-pay | Admitting: Cardiology

## 2018-04-18 ENCOUNTER — Telehealth: Payer: Self-pay | Admitting: Interventional Cardiology

## 2018-04-18 ENCOUNTER — Telehealth: Payer: Self-pay

## 2018-04-18 NOTE — Telephone Encounter (Signed)
Spoke with [address] coumadin dc pt not expected to live much longer will discontinue anticoag encounter and update med list routing to Dr. Irish Lack as an Juluis Rainier

## 2018-04-18 NOTE — Telephone Encounter (Signed)
New message      Pt grandson Justin Garner not on DPR returning a call . He stated that pt is in hospice and he doesn't plan on him living much longer. Pt is at [facility] [phone]. Advised I would tell nurse and cancel appts .

## 2018-04-18 NOTE — Telephone Encounter (Signed)
Called pt for overdue inr left msg

## 2018-04-20 DEATH — deceased

## 2018-05-30 ENCOUNTER — Ambulatory Visit: Payer: Medicare Other | Admitting: Interventional Cardiology

## 2018-06-15 IMAGING — CT CT ANGIO CHEST
2 of 7 series · 18 of 46 positions shown · IV contrast (APPLIED)
Comparison: Chest CT 06/20/2015

CLINICAL DATA: Shortness of breath and hemoptysis. History of lung
cancer in 5110.

EXAM:
CT ANGIOGRAPHY CHEST WITH CONTRAST
TECHNIQUE: Multidetector CT imaging of the chest was performed using the
standard protocol during bolus administration of intravenous
contrast. Multiplanar CT image reconstructions and MIPs were
obtained to evaluate the vascular anatomy.
CONTRAST:  80mL Q7RHJR-WHK IOPAMIDOL (Q7RHJR-WHK) INJECTION 76%

[Series 7: thins · axial · 0.74mm/px · z∈[+1201,+1502]mm · 15 of 484 slices shown]
[im 27/484  lung]
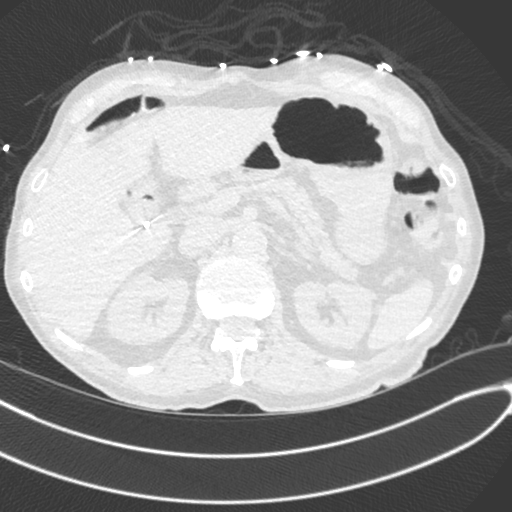
[im 54/484  soft-tissue]
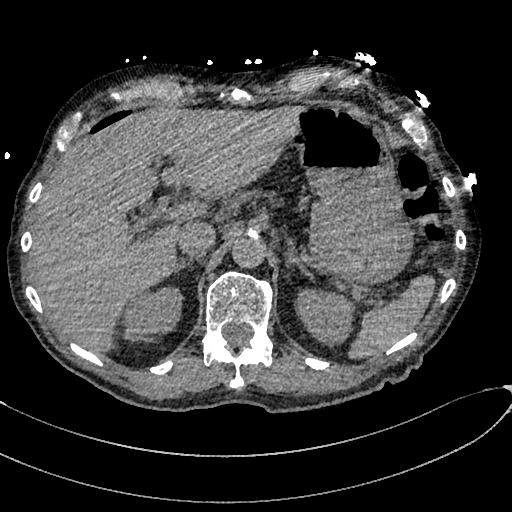
[im 81/484  lung]
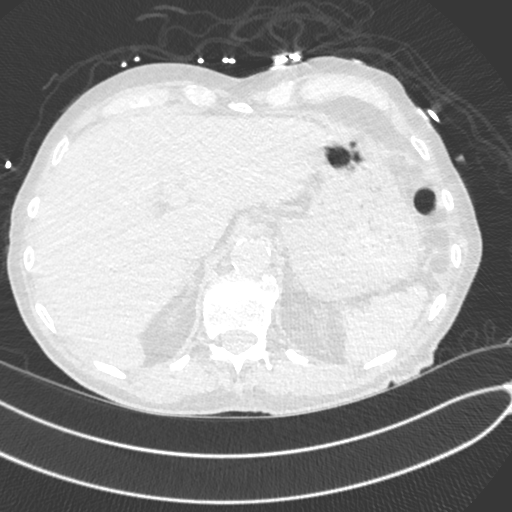
[im 108/484  soft-tissue]
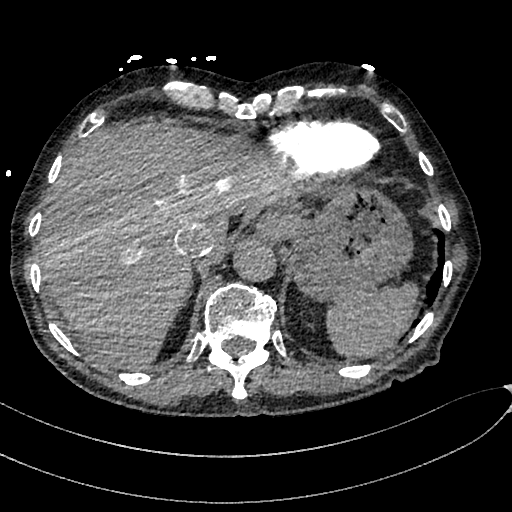
[im 162/484  lung]
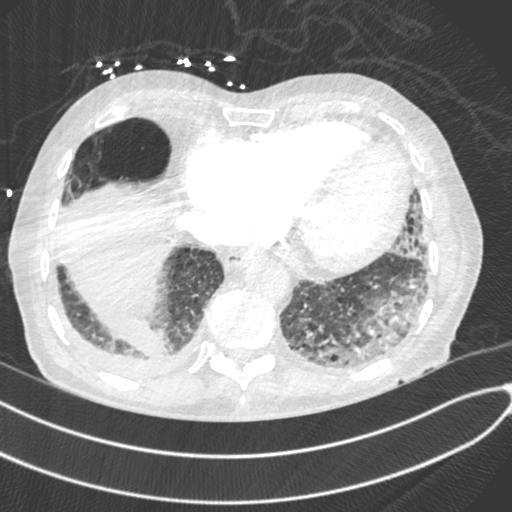
[im 188/484  soft-tissue]
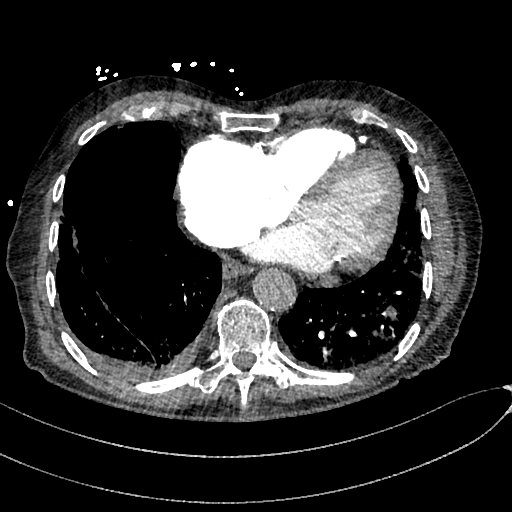
[im 215/484  lung]
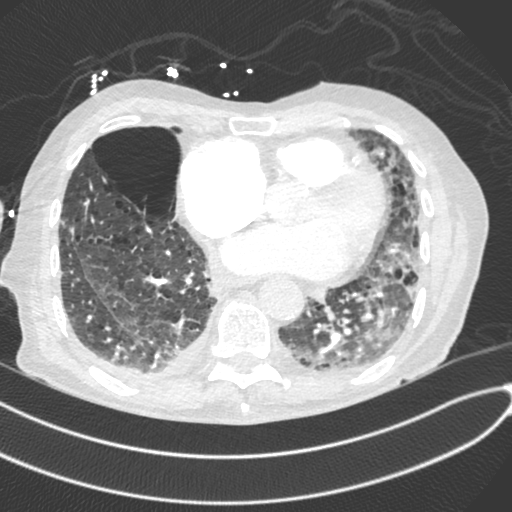
[im 242/484  soft-tissue]
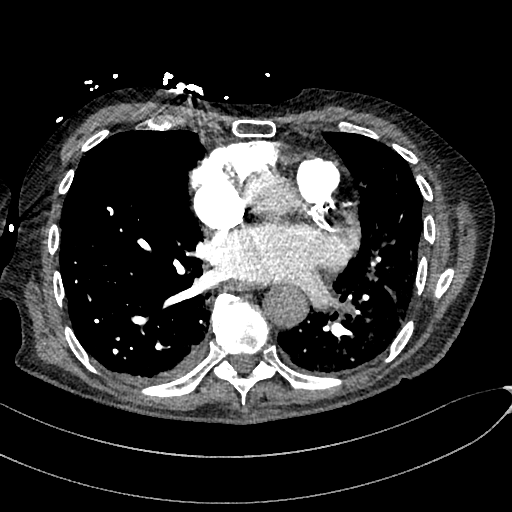
[im 269/484  lung]
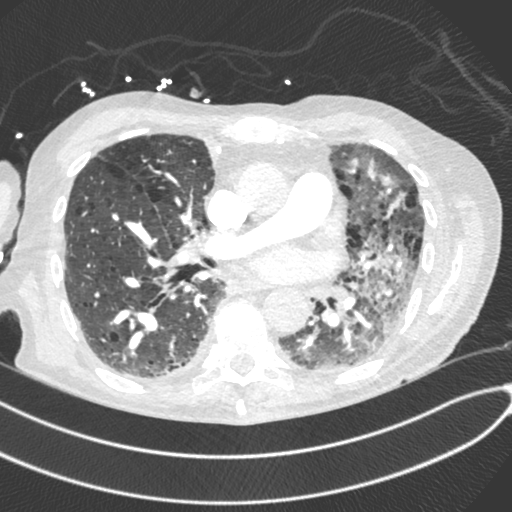
[im 296/484  soft-tissue]
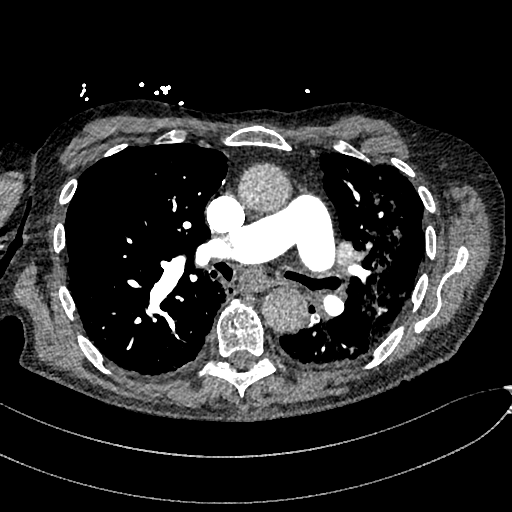
[im 323/484  lung]
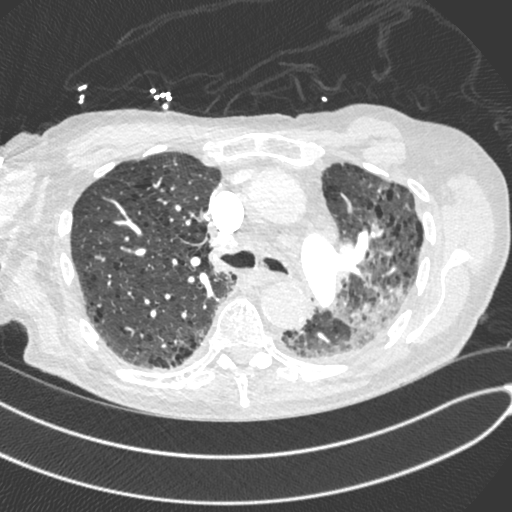
[im 376/484  soft-tissue]
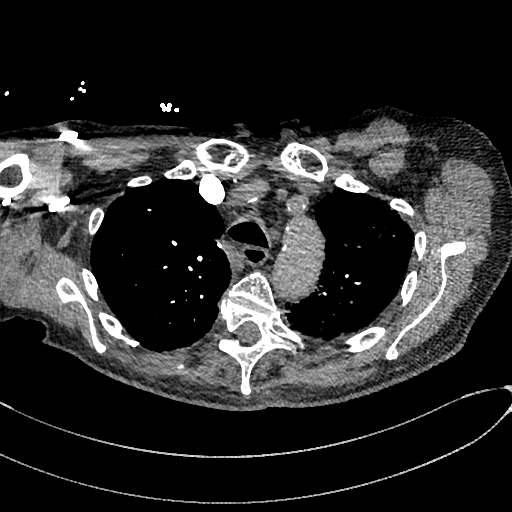
[im 403/484  lung]
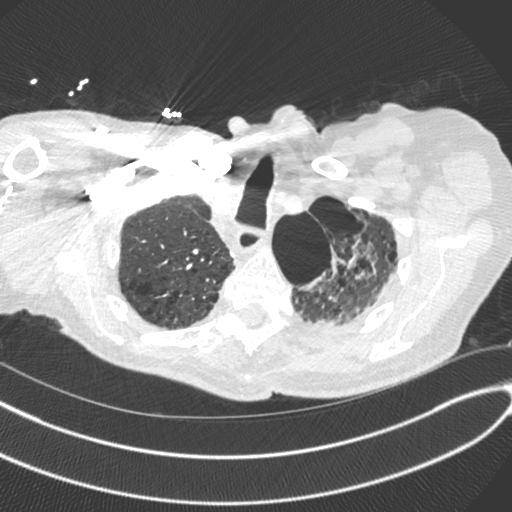
[im 430/484  soft-tissue]
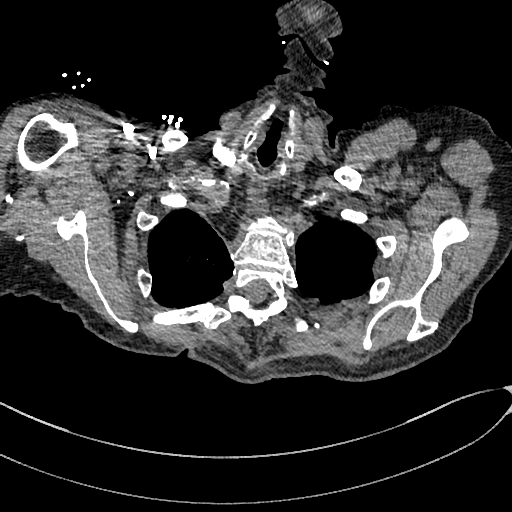
[im 457/484  lung]
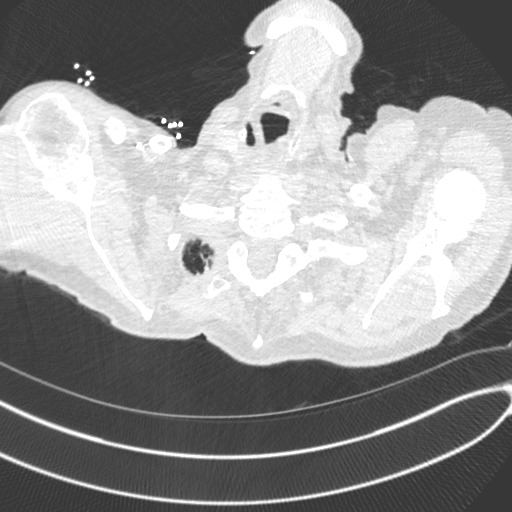

[Series 8: cor · coronal · 0.66mm/px · 3 of 150 slices shown]
[im 30/150  soft-tissue]
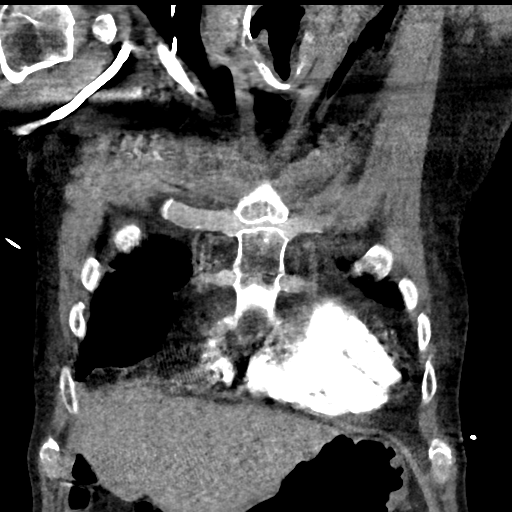
[im 60/150  soft-tissue]
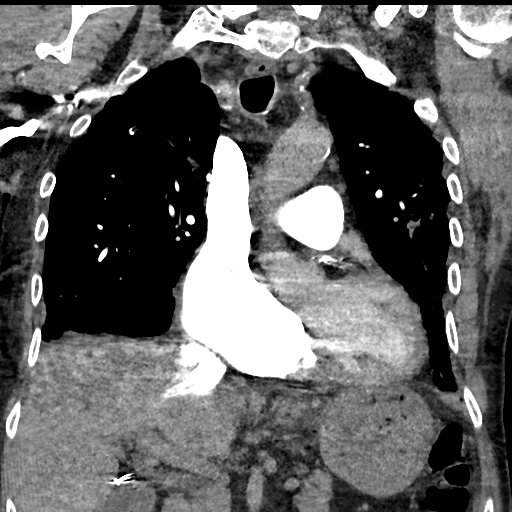
[im 90/150  soft-tissue]
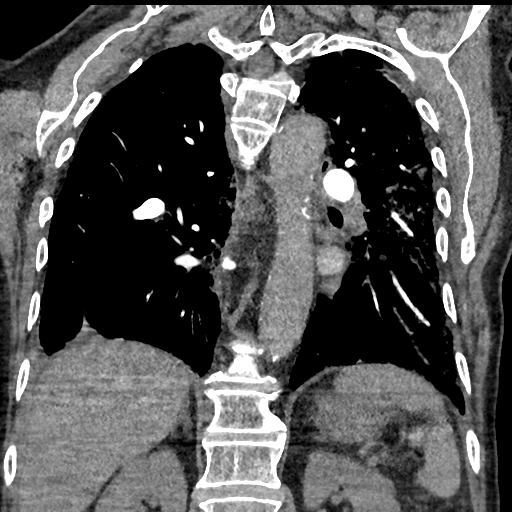

[18 of 46 positions shown; findings below may reference images not displayed]

FINDINGS: Cardiovascular: Satisfactory opacification of the pulmonary arteries
to the segmental level. No evidence of pulmonary embolism. Enlarged
heart. No pericardial effusion. Calcific atherosclerotic disease of
the coronary arteries and less so aorta. Contrast reflux into the
hepatic veins.

Mediastinum/Nodes: Mild anterior mediastinal lymphadenopathy with
the most prominent left pretracheal lymph node measuring 12 mm in
short axis. Normal appearance of the trachea and esophagus. No
thyroid nodules.

Lungs/Pleura: Advanced upper lobe predominant emphysematous changes
with large bulla formation bilaterally. Stable postsurgical changes
from right upper lobectomy. Small right pleural effusion. Diffuse
alveolar and interstitial opacities throughout the left lung.

Upper Abdomen: No acute abnormality.

Musculoskeletal: No chest wall abnormality. No acute or significant
osseous findings.

Review of the MIP images confirms the above findings.
IMPRESSION: Diffuse alveolar and interstitial opacities throughout the left lung
with appearance most suggestive of infectious consolidation.
Asymmetric pulmonary edema is considered less likely.

Stable postsurgical changes from right upper lobectomy. Persistent
small right pleural effusion.

Underlying severe upper thorax predominant emphysematous changes
with large bulla formation.

Mild mediastinal lymphadenopathy.

Enlarged heart with probable right heart failure.

Heavy calcific atherosclerotic disease of the coronary arteries.

No evidence of pulmonary embolus.

Aortic Atherosclerosis (HUBW9-9IR.R) and Emphysema ([id number].O).
